# Patient Record
Sex: Male | Born: 1960
Health system: Southern US, Community
[De-identification: ages and names within clinical notes are randomized; demographics above are authoritative.]

## PROBLEM LIST (undated history)

## (undated) DIAGNOSIS — J449 Chronic obstructive pulmonary disease, unspecified: Secondary | ICD-10-CM

## (undated) DIAGNOSIS — I509 Heart failure, unspecified: Secondary | ICD-10-CM

## (undated) DIAGNOSIS — F32A Depression, unspecified: Secondary | ICD-10-CM

## (undated) DIAGNOSIS — E7800 Pure hypercholesterolemia, unspecified: Secondary | ICD-10-CM

## (undated) DIAGNOSIS — F419 Anxiety disorder, unspecified: Secondary | ICD-10-CM

## (undated) DIAGNOSIS — K219 Gastro-esophageal reflux disease without esophagitis: Secondary | ICD-10-CM

## (undated) DIAGNOSIS — I10 Essential (primary) hypertension: Secondary | ICD-10-CM

## (undated) DIAGNOSIS — J189 Pneumonia, unspecified organism: Secondary | ICD-10-CM

## (undated) DIAGNOSIS — F4310 Post-traumatic stress disorder, unspecified: Secondary | ICD-10-CM

## (undated) DIAGNOSIS — E119 Type 2 diabetes mellitus without complications: Secondary | ICD-10-CM

## (undated) HISTORY — PX: WISDOM TOOTH EXTRACTION: SHX21

## (undated) HISTORY — DX: Post-traumatic stress disorder, unspecified: F43.10

## (undated) HISTORY — DX: Heart failure, unspecified: I50.9

## (undated) HISTORY — PX: TONSILLECTOMY: SUR1361

## (undated) MED FILL — Medication: Fill #1 | Status: CN

---

## 2012-05-26 ENCOUNTER — Emergency Department: Payer: Self-pay | Admitting: Emergency Medicine

## 2017-06-06 ENCOUNTER — Ambulatory Visit: Admit: 2017-06-06 | Discharge: 2017-06-06 | Payer: PRIVATE HEALTH INSURANCE | Attending: Family Medicine

## 2017-06-06 ENCOUNTER — Inpatient Hospital Stay: Admit: 2017-06-06

## 2017-06-06 DIAGNOSIS — E119 Type 2 diabetes mellitus without complications: Secondary | ICD-10-CM

## 2017-06-06 MED ORDER — OMEPRAZOLE 40 MG CAP, DELAYED RELEASE
40 mg | ORAL_CAPSULE | Freq: Every day | ORAL | 2 refills | Status: AC
Start: 2017-06-06 — End: ?

## 2017-06-06 MED ORDER — ATORVASTATIN 40 MG TAB
40 mg | ORAL_TABLET | Freq: Every day | ORAL | 2 refills | Status: AC
Start: 2017-06-06 — End: ?

## 2017-06-06 MED ORDER — GABAPENTIN 300 MG CAP
300 mg | ORAL_CAPSULE | Freq: Three times a day (TID) | ORAL | 2 refills | Status: AC
Start: 2017-06-06 — End: ?

## 2017-06-06 MED ORDER — METFORMIN 1,000 MG TAB
1000 mg | ORAL_TABLET | Freq: Two times a day (BID) | ORAL | 2 refills | Status: AC
Start: 2017-06-06 — End: ?

## 2017-06-06 MED ORDER — CIPROFLOXACIN 500 MG TAB
500 mg | ORAL_TABLET | Freq: Two times a day (BID) | ORAL | 0 refills | Status: AC
Start: 2017-06-06 — End: 2017-06-13

## 2017-06-06 NOTE — Progress Notes (Signed)
Discharge instructions reviewed with patient  ??  Medication list and understanding of medications reviewed with patient.   OTC and herbal medications reviewed and added to med list if applicable  Barriers to adherence assessed.        Informed patient to give the office a call to follow up in a month. Patient expressed understanding.

## 2017-06-06 NOTE — Progress Notes (Signed)
Chief Complaint   Patient presents with   ??? Diabetes     "pt stated that he have not been on any medication for about 2 years pt stated he's not sure on medications he was on"   ??? Foot Ulcer     1. When and where did you last receive medical care? Yes Where: [place]     2.When and where did you last have preventive care such as mammogram, pap smears or colon screening? yes    3. What is your current living situation (for example, live alone, live in home with immediate family members)?       4. Do you have any problems with communication such trouble seeing, hearing, or understanding instructions?No    5. Do you have an advance directive?  This is a document that you can give to family members with instructions for how you would want them to make health care decisions for you if you were unable to speak for yourself.  (For example, unconscious, delerious)No    PMH/FH/Social Hx reviewed and updated as needed     Applicable screenings reviewed and updated as needed  Medication reconciliation performed. Patient does need medication refills.  Health Maintenance reviewed.

## 2017-06-06 NOTE — Progress Notes (Signed)
HPI  Bill Sandoval is a 56 y.o. male being seen today for   Chief Complaint   Patient presents with   ??? Diabetes     "pt stated that he have not been on any medication for about 2 years pt stated he's not sure on medications he was on"   ??? Foot Ulcer   IOV for this pt to care a Zenaida Niecevan.  he states that he is not on any diabetes medicine for a few years.   Has neuropathy and stepped on some glass in September.  Right foot swelled up but then got much better.  Still somewhat swollen and his big toenail is dark   Wonders if he needs an antibiotic    Smokes 1ppd.  Not ready to quit.     Previously on janumet, lisinopril. Amlodipine, flexeril, gabapentin, cymbalta    Past Medical History:   Diagnosis Date   ??? Back disorder    ??? Cardiovascular disease    ??? Diabetes (HCC)    ??? Hypercholesterolemia    ??? Hypertension    ??? Liver disease    ??? Spinal pain          ROS  Patient states that he is feeling well. Denies complaints of chest pain, shortness of breath, swelling of legs, dizziness or weakness. he denies nausea, vomiting or diarrhea.        Current Outpatient Medications   Medication Sig   ??? metFORMIN (GLUCOPHAGE) 1,000 mg tablet Take 1 Tab by mouth two (2) times daily (with meals).   ??? omeprazole (PRILOSEC) 40 mg capsule Take 1 Cap by mouth daily.   ??? ciprofloxacin HCl (CIPRO) 500 mg tablet Take 1 Tab by mouth two (2) times a day for 7 days.   ??? gabapentin (NEURONTIN) 300 mg capsule Take 1 Cap by mouth three (3) times daily.   ??? atorvastatin (LIPITOR) 40 mg tablet Take 1 Tab by mouth daily.   ??? SITagliptin-metFORMIN (JANUMET) 50-1,000 mg per tablet Take 1 Tab by mouth two (2) times daily (with meals).   ??? amLODIPine (NORVASC) 5 mg tablet Take 5 mg by mouth daily.   ??? cyclobenzaprine (FLEXERIL) 10 mg tablet Take  by mouth three (3) times daily as needed for Muscle Spasm(s).     No current facility-administered medications for this visit.        PE  Visit Vitals   BP 123/82 (BP 1 Location: Left arm, BP Patient Position: Sitting)   Pulse 86   Temp 97.5 ??F (36.4 ??C) (Temporal)   Resp 12   Ht 5\' 10"  (1.778 m)   Wt 187 lb (84.8 kg)   SpO2 98%   BMI 26.83 kg/m??      Alert and oriented with normal mood and affect. he is well developed and well nourished . Lungs are clear without wheezing. Heart rate is regular without murmurs or gallops. There is no lower extremity edema.  Normal ROM ankles and feet.  Some decreased sensation but normal DP pulse and no swelling.  Some tenderness    No results found for this or any previous visit.      Assessment and Plan:        ICD-10-CM ICD-9-CM    1. Controlled type 2 diabetes mellitus without complication, without long-term current use of insulin (HCC) E11.9 250.00    2. Foot infection L08.9 686.9    3. Smoker F17.200 305.1    4. Neuropathy G62.9 355.9        Refill  metformin, gabapentin, lipitor, prilosec  Labs today    cipro rx if foot worsens but expect will continue to improve  Follow up one month    Donnie MesaEmily W Lakyn Mantione, MD

## 2017-06-07 LAB — CBC WITH AUTOMATED DIFF
ABS. BASOPHILS: 0 10*3/uL (ref 0.0–0.1)
ABS. EOSINOPHILS: 0.2 10*3/uL (ref 0.0–0.4)
ABS. LYMPHOCYTES: 2.1 10*3/uL (ref 0.9–3.6)
ABS. MONOCYTES: 0.7 10*3/uL (ref 0.05–1.2)
ABS. NEUTROPHILS: 7.6 10*3/uL (ref 1.8–8.0)
BASOPHILS: 0 % (ref 0–2)
EOSINOPHILS: 2 % (ref 0–5)
HCT: 44.9 % (ref 36.0–48.0)
HGB: 15.1 g/dL (ref 13.0–16.0)
LYMPHOCYTES: 20 % — ABNORMAL LOW (ref 21–52)
MCH: 32.8 PG (ref 24.0–34.0)
MCHC: 33.6 g/dL (ref 31.0–37.0)
MCV: 97.4 FL — ABNORMAL HIGH (ref 74.0–97.0)
MONOCYTES: 7 % (ref 3–10)
MPV: 11.6 FL (ref 9.2–11.8)
NEUTROPHILS: 71 % (ref 40–73)
PLATELET: 212 10*3/uL (ref 135–420)
RBC: 4.61 M/uL — ABNORMAL LOW (ref 4.70–5.50)
RDW: 12.7 % (ref 11.6–14.5)
WBC: 10.6 10*3/uL (ref 4.6–13.2)

## 2017-06-07 LAB — METABOLIC PANEL, COMPREHENSIVE
A-G Ratio: 1.2 (ref 0.8–1.7)
ALT (SGPT): 31 U/L (ref 16–61)
AST (SGOT): 11 U/L — ABNORMAL LOW (ref 15–37)
Albumin: 4.1 g/dL (ref 3.4–5.0)
Alk. phosphatase: 94 U/L (ref 45–117)
Anion gap: 9 mmol/L (ref 3.0–18)
BUN/Creatinine ratio: 19 (ref 12–20)
BUN: 18 MG/DL (ref 7.0–18)
Bilirubin, total: 0.4 MG/DL (ref 0.2–1.0)
CO2: 25 mmol/L (ref 21–32)
Calcium: 9 MG/DL (ref 8.5–10.1)
Chloride: 105 mmol/L (ref 100–108)
Creatinine: 0.93 MG/DL (ref 0.6–1.3)
GFR est AA: >60 mL/min/{1.73_m2} (ref >60)
GFR est non-AA: >60 mL/min/{1.73_m2} (ref >60)
Globulin: 3.4 g/dL (ref 2.0–4.0)
Glucose: 232 mg/dL — ABNORMAL HIGH (ref 74–99)
Potassium: 4 mmol/L (ref 3.5–5.5)
Protein, total: 7.5 g/dL (ref 6.4–8.2)
Sodium: 139 mmol/L (ref 136–145)

## 2017-06-07 LAB — LIPID PANEL
CHOL/HDL Ratio: 6 — ABNORMAL HIGH (ref 0–5.0)
Cholesterol, total: 241 MG/DL — ABNORMAL HIGH (ref <200)
HDL Cholesterol: 40 MG/DL (ref 40–60)
LDL, calculated: 132.2 MG/DL — ABNORMAL HIGH (ref 0–100)
Triglyceride: 344 MG/DL — ABNORMAL HIGH (ref <150)
VLDL, calculated: 68.8 MG/DL

## 2017-06-07 LAB — MICROALBUMIN, UR, RAND W/ MICROALB/CREAT RATIO
Creatinine, urine random: 278 mg/dL — ABNORMAL HIGH (ref 30–125)
Microalbumin,urine random: 70.7 MG/DL — ABNORMAL HIGH (ref 0–3.0)
Microalbumin/Creat ratio (mg/g creat): 254 mg/g — ABNORMAL HIGH (ref 0–30)

## 2017-06-07 LAB — HEMOGLOBIN A1C WITH EAG
Est. average glucose: 249 mg/dL
Hemoglobin A1c: 10.3 % — ABNORMAL HIGH (ref 4.2–5.6)

## 2017-06-07 LAB — TSH 3RD GENERATION: TSH: 1.6 u[IU]/mL (ref 0.36–3.74)

## 2017-07-04 ENCOUNTER — Encounter: Attending: Family Medicine

## 2019-05-08 ENCOUNTER — Ambulatory Visit
Admission: RE | Admit: 2019-05-08 | Discharge: 2019-05-08 | Disposition: A | Discharge status: Home / Self Care | Payer: Disability Insurance | Source: Ambulatory Visit | Attending: Dentistry | Admitting: Dentistry

## 2019-05-08 ENCOUNTER — Other Ambulatory Visit: Payer: Self-pay | Admitting: Dentistry

## 2019-05-08 ENCOUNTER — Ambulatory Visit
Admission: RE | Admit: 2019-05-08 | Discharge: 2019-05-08 | Disposition: A | Discharge status: Home / Self Care | Payer: Disability Insurance | Attending: Dentistry | Admitting: Dentistry

## 2019-05-08 DIAGNOSIS — M545 Low back pain, unspecified: Secondary | ICD-10-CM

## 2019-06-17 ENCOUNTER — Encounter: Payer: Self-pay | Admitting: Emergency Medicine

## 2019-06-17 ENCOUNTER — Other Ambulatory Visit: Payer: Self-pay

## 2019-06-17 ENCOUNTER — Emergency Department: Payer: Disability Insurance

## 2019-06-17 ENCOUNTER — Emergency Department
Admission: EM | Admit: 2019-06-17 | Discharge: 2019-06-17 | Disposition: A | Discharge status: Home / Self Care | Payer: Disability Insurance | Attending: Emergency Medicine | Admitting: Emergency Medicine

## 2019-06-17 DIAGNOSIS — E119 Type 2 diabetes mellitus without complications: Secondary | ICD-10-CM | POA: Insufficient documentation

## 2019-06-17 DIAGNOSIS — I10 Essential (primary) hypertension: Secondary | ICD-10-CM | POA: Insufficient documentation

## 2019-06-17 DIAGNOSIS — J189 Pneumonia, unspecified organism: Secondary | ICD-10-CM | POA: Insufficient documentation

## 2019-06-17 DIAGNOSIS — F17200 Nicotine dependence, unspecified, uncomplicated: Secondary | ICD-10-CM | POA: Insufficient documentation

## 2019-06-17 HISTORY — DX: Type 2 diabetes mellitus without complications: E11.9

## 2019-06-17 HISTORY — DX: Essential (primary) hypertension: I10

## 2019-06-17 HISTORY — DX: Pure hypercholesterolemia, unspecified: E78.00

## 2019-06-17 LAB — CBC
HCT: 38.8 % — ABNORMAL LOW (ref 39.0–52.0)
Hemoglobin: 14 g/dL (ref 13.0–17.0)
MCH: 32 pg (ref 26.0–34.0)
MCHC: 36.1 g/dL — ABNORMAL HIGH (ref 30.0–36.0)
MCV: 88.8 fL (ref 80.0–100.0)
Platelets: 306 10*3/uL (ref 150–400)
RBC: 4.37 MIL/uL (ref 4.22–5.81)
RDW: 12.4 % (ref 11.5–15.5)
WBC: 13.7 10*3/uL — ABNORMAL HIGH (ref 4.0–10.5)
nRBC: 0 % (ref 0.0–0.2)

## 2019-06-17 LAB — BASIC METABOLIC PANEL
Anion gap: 12 (ref 5–15)
BUN: 14 mg/dL (ref 6–20)
CO2: 25 mmol/L (ref 22–32)
Calcium: 9.2 mg/dL (ref 8.9–10.3)
Chloride: 97 mmol/L — ABNORMAL LOW (ref 98–111)
Creatinine, Ser: 0.89 mg/dL (ref 0.61–1.24)
GFR calc Af Amer: >60 mL/min (ref >60)
GFR calc non Af Amer: >60 mL/min (ref >60)
Glucose, Bld: 322 mg/dL — ABNORMAL HIGH (ref 70–99)
Potassium: 3.9 mmol/L (ref 3.5–5.1)
Sodium: 134 mmol/L — ABNORMAL LOW (ref 135–145)

## 2019-06-17 LAB — TROPONIN I (HIGH SENSITIVITY): Troponin I (High Sensitivity): 7 ng/L (ref <18)

## 2019-06-17 MED ORDER — SODIUM CHLORIDE 0.9% FLUSH: 3.0000 mL | Freq: Once | INTRAVENOUS | Status: DC

## 2019-06-17 MED ORDER — LEVOFLOXACIN 750 MG PO TABS: 750.0000 mg | ORAL_TABLET | Freq: Every day | ORAL | 0 refills | Status: AC

## 2019-06-17 NOTE — ED Provider Notes (Signed)
[facility] Emergency Department Provider Note   ____________________________________________    I have reviewed the triage vital signs and the nursing notes.   HISTORY  Chief Complaint Shortness of Breath and Dizziness     HPI Timothy [name]. is a 58 y.o. male with a history of diabetes, hypercholesterolemia, hypertension, with a history of smoking who presents now with complaints of mild shortness of breath, right mid chest discomfort for several days now.  Does report a cough that is productive with yellow mucus.  No fevers but has had chills.  No sick contacts reported.  No significant myalgias.  No loss of taste or smell.  Past Medical History:  Diagnosis Date  . Diabetes mellitus without complication ([place])   . Hypercholesteremia   . Hypertension     There are no problems to display for this patient.   Past Surgical History:  Procedure Laterality Date  . TONSILLECTOMY      Prior to Admission medications   Medication Sig Start Date End Date Taking? Authorizing Provider  levofloxacin (LEVAQUIN) 750 MG tablet Take 1 tablet (750 mg total) by mouth daily for 7 days. 06/17/19 06/24/19  Lavonia Drafts, MD     Allergies Bee venom, Coffee flavor, Cheese, and Penicillins  No family history on file.  Social History Social History   Tobacco Use  . Smoking status: Current Every Day Smoker  . Smokeless tobacco: Never Used  Substance Use Topics  . Alcohol use: Not Currently  . Drug use: Not on file    Review of Systems  Constitutional: As above Eyes: No visual changes.  ENT: No sore throat. Cardiovascular: As above Respiratory: As above Gastrointestinal: No abdominal pain.  Genitourinary: No complaints of urinary Musculoskeletal: Negative for back pain. Skin: Negative for rash. Neurological: Negative for headaches    ____________________________________________   PHYSICAL EXAM:  VITAL SIGNS: ED Triage Vitals  Enc  Vitals Group     BP 06/17/19 1236 111/79     Pulse --      Resp 06/17/19 1236 17     Temp 06/17/19 1236 98.1 F (36.7 C)     Temp src --      SpO2 06/17/19 1236 98 %     Weight 06/17/19 1238 83.9 kg (185 lb)     Height 06/17/19 1238 1.778 m (5\' 10" )     Head Circumference --      Peak Flow --      Pain Score 06/17/19 1237 4     Pain Loc --      Pain Edu? --      Excl. in [place]? --     Constitutional: Alert and oriented.   Nose: No congestion/rhinnorhea. Mouth/Throat: Mucous membranes are moist.    Cardiovascular: Normal rate, regular rhythm.  Good peripheral circulation. Respiratory: Normal respiratory effort.  No retractions.  No wheezing   Musculoskeletal:  Warm and well perfused Neurologic:  Normal speech and language. No gross focal neurologic deficits are appreciated.  Skin:  Skin is warm, dry and intact. No rash noted. Psychiatric: Mood and affect are normal. Speech and behavior are normal.  ____________________________________________   LABS (all labs ordered are listed, but only abnormal results are displayed)  Labs Reviewed  BASIC METABOLIC PANEL - Abnormal; Notable for the following components:      Result Value   Sodium 134 (*)    Chloride 97 (*)    Glucose, Bld 322 (*)    All other components within  normal limits  CBC - Abnormal; Notable for the following components:   WBC 13.7 (*)    HCT 38.8 (*)    MCHC 36.1 (*)    All other components within normal limits  TROPONIN I (HIGH SENSITIVITY)  TROPONIN I (HIGH SENSITIVITY)   ____________________________________________  EKG  ED ECG REPORT I, Jene Every, the attending physician, personally viewed and interpreted this ECG.  Date: 06/17/2019  Rhythm: normal sinus rhythm QRS Axis: normal Intervals: normal ST/T Wave abnormalities: Nonspecific changes Narrative Interpretation: no evidence of acute ischemia  ____________________________________________  RADIOLOGY  Chest x-ray suspicious for  right upper lobe pneumonia ____________________________________________   PROCEDURES  Procedure(s) performed: No  Procedures   Critical Care performed: No ____________________________________________   INITIAL IMPRESSION / ASSESSMENT AND PLAN / ED COURSE  Pertinent labs & imaging results that were available during my care of the patient were reviewed by me and considered in my medical decision making (see chart for details).  Patient well-appearing and in no acute distress.  History of diabetes, smoking, chest x-ray and HPI consistent with pneumonia.  Will start patient on Levaquin, discussed with him the need for repeat x-ray to demonstrate clearing, return precautions if no improvement    ____________________________________________   FINAL CLINICAL IMPRESSION(S) / ED DIAGNOSES  Final diagnoses:  Community acquired pneumonia of right upper lobe of lung        Note:  This document was prepared using Dragon voice recognition software and may include unintentional dictation errors.   Jene Every, MD 06/17/19 [phone]

## 2019-06-17 NOTE — ED Triage Notes (Addendum)
Right side lung pain for a bout a week.  deneis fever.  No injry.  Says chills though.  He braces under right arm when he coughs.  Also says he feels light headed at times.  This has been going on 2 years, but worse for couple weeks.

## 2019-07-14 DIAGNOSIS — F4310 Post-traumatic stress disorder, unspecified: Secondary | ICD-10-CM | POA: Insufficient documentation

## 2019-07-14 DIAGNOSIS — F602 Antisocial personality disorder: Secondary | ICD-10-CM | POA: Insufficient documentation

## 2019-08-16 ENCOUNTER — Encounter: Payer: Self-pay | Admitting: Emergency Medicine

## 2019-08-16 ENCOUNTER — Emergency Department
Admission: EM | Admit: 2019-08-16 | Discharge: 2019-08-16 | Disposition: A | Discharge status: Home / Self Care | Payer: Disability Insurance | Attending: Emergency Medicine | Admitting: Emergency Medicine

## 2019-08-16 ENCOUNTER — Other Ambulatory Visit: Payer: Self-pay

## 2019-08-16 DIAGNOSIS — Z9114 Patient's other noncompliance with medication regimen: Secondary | ICD-10-CM | POA: Insufficient documentation

## 2019-08-16 DIAGNOSIS — I10 Essential (primary) hypertension: Secondary | ICD-10-CM | POA: Insufficient documentation

## 2019-08-16 DIAGNOSIS — Z9119 Patient's noncompliance with other medical treatment and regimen: Secondary | ICD-10-CM

## 2019-08-16 DIAGNOSIS — Z7984 Long term (current) use of oral hypoglycemic drugs: Secondary | ICD-10-CM | POA: Insufficient documentation

## 2019-08-16 DIAGNOSIS — Z91199 Patient's noncompliance with other medical treatment and regimen due to unspecified reason: Secondary | ICD-10-CM

## 2019-08-16 DIAGNOSIS — L089 Local infection of the skin and subcutaneous tissue, unspecified: Secondary | ICD-10-CM

## 2019-08-16 DIAGNOSIS — E1165 Type 2 diabetes mellitus with hyperglycemia: Secondary | ICD-10-CM | POA: Insufficient documentation

## 2019-08-16 DIAGNOSIS — F17200 Nicotine dependence, unspecified, uncomplicated: Secondary | ICD-10-CM | POA: Insufficient documentation

## 2019-08-16 LAB — CBC WITH DIFFERENTIAL/PLATELET
Abs Immature Granulocytes: 0.03 10*3/uL (ref 0.00–0.07)
Basophils Absolute: 0.1 10*3/uL (ref 0.0–0.1)
Basophils Relative: 1 %
Eosinophils Absolute: 0.2 10*3/uL (ref 0.0–0.5)
Eosinophils Relative: 2 %
HCT: 42.2 % (ref 39.0–52.0)
Hemoglobin: 14.5 g/dL (ref 13.0–17.0)
Immature Granulocytes: 0 %
Lymphocytes Relative: 26 %
Lymphs Abs: 1.8 10*3/uL (ref 0.7–4.0)
MCH: 31.9 pg (ref 26.0–34.0)
MCHC: 34.4 g/dL (ref 30.0–36.0)
MCV: 93 fL (ref 80.0–100.0)
Monocytes Absolute: 0.5 10*3/uL (ref 0.1–1.0)
Monocytes Relative: 7 %
Neutro Abs: 4.4 10*3/uL (ref 1.7–7.7)
Neutrophils Relative %: 64 %
Platelets: 262 10*3/uL (ref 150–400)
RBC: 4.54 MIL/uL (ref 4.22–5.81)
RDW: 12.6 % (ref 11.5–15.5)
WBC: 7 10*3/uL (ref 4.0–10.5)
nRBC: 0 % (ref 0.0–0.2)

## 2019-08-16 LAB — COMPREHENSIVE METABOLIC PANEL
ALT: 13 U/L (ref 0–44)
AST: 16 U/L (ref 15–41)
Albumin: 4.2 g/dL (ref 3.5–5.0)
Alkaline Phosphatase: 66 U/L (ref 38–126)
Anion gap: 8 (ref 5–15)
BUN: 16 mg/dL (ref 6–20)
CO2: 25 mmol/L (ref 22–32)
Calcium: 8.8 mg/dL — ABNORMAL LOW (ref 8.9–10.3)
Chloride: 102 mmol/L (ref 98–111)
Creatinine, Ser: 0.8 mg/dL (ref 0.61–1.24)
GFR calc Af Amer: >60 mL/min (ref >60)
GFR calc non Af Amer: >60 mL/min (ref >60)
Glucose, Bld: 264 mg/dL — ABNORMAL HIGH (ref 70–99)
Potassium: 4.2 mmol/L (ref 3.5–5.1)
Sodium: 135 mmol/L (ref 135–145)
Total Bilirubin: 1 mg/dL (ref 0.3–1.2)
Total Protein: 7.2 g/dL (ref 6.5–8.1)

## 2019-08-16 LAB — GLUCOSE, CAPILLARY: Glucose-Capillary: 285 mg/dL — ABNORMAL HIGH (ref 70–99)

## 2019-08-16 MED ORDER — CEPHALEXIN 500 MG PO CAPS
500.0000 mg | ORAL_CAPSULE | Freq: Once | ORAL | Status: AC
  Administered 2019-08-16: 500 mg via ORAL
  Filled 2019-08-16: qty 1

## 2019-08-16 MED ORDER — METFORMIN HCL 500 MG PO TABS: 500.0000 mg | ORAL_TABLET | Freq: Every day | ORAL | 4 refills | Status: DC

## 2019-08-16 MED ORDER — METFORMIN HCL 500 MG PO TABS
500.0000 mg | ORAL_TABLET | Freq: Once | ORAL | Status: AC
  Administered 2019-08-16: 500 mg via ORAL
  Filled 2019-08-16: qty 1

## 2019-08-16 MED ORDER — SULFAMETHOXAZOLE-TRIMETHOPRIM 800-160 MG PO TABS
1.0000 | ORAL_TABLET | Freq: Once | ORAL | Status: AC
  Administered 2019-08-16: 1 via ORAL
  Filled 2019-08-16: qty 1

## 2019-08-16 MED ORDER — SULFAMETHOXAZOLE-TRIMETHOPRIM 800-160 MG PO TABS: 1.0000 | ORAL_TABLET | Freq: Two times a day (BID) | ORAL | 0 refills | Status: DC

## 2019-08-16 MED ORDER — CEPHALEXIN 500 MG PO CAPS: 500.0000 mg | ORAL_CAPSULE | Freq: Three times a day (TID) | ORAL | 0 refills | Status: DC

## 2019-08-16 NOTE — ED Provider Notes (Signed)
[facility] Emergency Department Provider Note  ____________________________________________   First MD Initiated Contact with Patient 08/16/19 1319     (approximate)  I have reviewed the triage vital signs and the nursing notes.   HISTORY  Chief Complaint Wound Check   HPI Timothy Pella. is a 59 y.o. male presents to the ED with complaint of a "hole" on the bottom of his right foot.  Patient states that 3 days ago he took a shower while he was drying off noticed a callus that he scratched at.  He states since that time he has noticed some "leaking" from the area.  Patient states he has diabetes and currently is not on any medication as he is unable to afford it.  Patient states is been 5 years since he has taken any diabetic medicine.  He denies any fever or chills.  He does have peripheral neuropathy in his feet.      Past Medical History:  Diagnosis Date  . Diabetes mellitus without complication ([place])   . Hypercholesteremia   . Hypertension     There are no problems to display for this patient.   Past Surgical History:  Procedure Laterality Date  . TONSILLECTOMY      Prior to Admission medications   Medication Sig Start Date End Date Taking? Authorizing Provider  cephALEXin (KEFLEX) 500 MG capsule Take 1 capsule (500 mg total) by mouth 3 (three) times daily. 08/16/19   Johnn Hai, PA-C  metFORMIN (GLUCOPHAGE) 500 MG tablet Take 1 tablet (500 mg total) by mouth daily with breakfast. 08/16/19 08/15/20  Letitia Neri L, PA-C  sulfamethoxazole-trimethoprim (BACTRIM DS) 800-160 MG tablet Take 1 tablet by mouth 2 (two) times daily. 08/16/19   Johnn Hai, PA-C    Allergies Bee venom, Coffee flavor, Cheese, and Penicillins  History reviewed. No pertinent family history.  Social History Social History   Tobacco Use  . Smoking status: Current Every Day Smoker  . Smokeless tobacco: Never Used  Substance Use Topics  . Alcohol  use: Not Currently  . Drug use: Not on file    Review of Systems Constitutional: No fever/chills Cardiovascular: Denies chest pain. Respiratory: Denies shortness of breath. Musculoskeletal: Negative for back pain. Skin: Positive for open wound right foot. Neurological: Negative for headaches, focal weakness or numbness. ____________________________________________   PHYSICAL EXAM:  VITAL SIGNS: ED Triage Vitals  Enc Vitals Group     BP 08/16/19 1302 (!) 158/90     Pulse Rate 08/16/19 1302 85     Resp 08/16/19 1302 18     Temp 08/16/19 1302 99 F (37.2 C)     Temp Source 08/16/19 1302 Oral     SpO2 08/16/19 1302 99 %     Weight 08/16/19 1306 177 lb (80.3 kg)     Height 08/16/19 1306 5\' 10"  (1.778 m)     Head Circumference --      Peak Flow --      Pain Score 08/16/19 1305 7     Pain Loc --      Pain Edu? --      Excl. in [place]? --     Constitutional: Alert and oriented. Well appearing and in no acute distress. Eyes: Conjunctivae are normal.  Head: Atraumatic. Neck: No stridor.   Cardiovascular: Normal rate, regular rhythm. Grossly normal heart sounds.  Good peripheral circulation. Respiratory: Normal respiratory effort.  No retractions. Lungs CTAB. Musculoskeletal: No lower extremity tenderness nor edema.  No  joint effusions.  Nontender on palpation of the right foot.  There is a strong dorsalis pedis palpated.  No erythema or warmth is appreciated.  There is a single superficial open area on the plantar aspect at the base of the first and second digit without active drainage. Neurologic:  Normal speech and language. No gross focal neurologic deficits are appreciated. No gait instability. Skin:  Skin is warm, dry.  Psychiatric: Mood and affect are normal. Speech and behavior are normal.  ____________________________________________   LABS (all labs ordered are listed, but only abnormal results are displayed)  Labs Reviewed  GLUCOSE, CAPILLARY - Abnormal; Notable for  the following components:      Result Value   Glucose-Capillary 285 (*)    All other components within normal limits  COMPREHENSIVE METABOLIC PANEL - Abnormal; Notable for the following components:   Glucose, Bld 264 (*)    Calcium 8.8 (*)    All other components within normal limits  AEROBIC CULTURE (SUPERFICIAL SPECIMEN)  CBC WITH DIFFERENTIAL/PLATELET    PROCEDURES  Procedure(s) performed (including Critical Care):  Procedures   ____________________________________________   INITIAL IMPRESSION / ASSESSMENT AND PLAN / ED COURSE  As part of my medical decision making, I reviewed the following data within the electronic MEDICAL RECORD NUMBER Notes from prior ED visits and [place] Controlled Substance Database  59 year old male presents to the ED with complaint of an opening on the anterior aspect of his right foot.  Patient states that he took a shower and was scratching a callused area on his right foot which caused the initial skin injury.  Patient denies any fever or chills.  He has not been taking any diabetic medicine as he states he cannot afford it.  I he has been 5 years since he last took anything for diabetes.  Lab work was reassuring and patient was made aware.  His allergy to penicillin was when he was a child and we discussed Keflex.  He is aware that if he begins with a rash he is to discontinue the cephalexin.  He was also given a prescription for Bactrim DS twice daily for 10 days and Metformin 500 mg twice daily.  His medicines were sent to medication management for him to pick up on Monday.  The first dose was given in the ED today.  He was given a list of clinics to follow-up with including [facility] for [facility] of his diabetes and also to watch for any continued problems with his foot.  ____________________________________________   FINAL CLINICAL IMPRESSION(S) / ED DIAGNOSES  Final diagnoses:  Right foot infection  Poorly controlled diabetes  mellitus (HCC)  Medically noncompliant     ED Discharge Orders         Ordered    sulfamethoxazole-trimethoprim (BACTRIM DS) 800-160 MG tablet  2 times daily     08/16/19 1417    cephALEXin (KEFLEX) 500 MG capsule  3 times daily     08/16/19 1417    metFORMIN (GLUCOPHAGE) 500 MG tablet  Daily with breakfast     08/16/19 1417           Note:  This document was prepared using Dragon voice recognition software and may include unintentional dictation errors.    Tommi Rumps, PA-C 08/16/19 1448    Minna Antis, MD 08/16/19 [phone]

## 2019-08-16 NOTE — ED Triage Notes (Signed)
Pt arrived via POV with wound to plantar area of right foot, states noticed about 3 days ago, pt is diabetic not currently on medications due to inability to afford meds.  Pt states he does have neuropathy to feet.   Wound on assessment is dry at this time, but pt states drainage is present  At times. 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   [facility] to [facility].

## 2019-08-16 NOTE — Discharge Instructions (Addendum)
Tomorrow go to medication management which is across from the hospital on [facility].  Your medications were sent there to be filled.  Also you need to make arrangements to be [facility] which is [facility].  You may also follow-up with [facility], [facility], or [facility] and their numbers are listed on your discharge papers.  Keep this area clean and dry.  Clean daily with mild soap and water.  Take medications as prescribed.  Also watch your diabetic diet which will help control your diabetes.  Return to the emergency department if any urgent changes or worsening of your symptoms.  If you develop any rash discontinue taking the Keflex which is the one that we discussed.  Although it is not penicillin you could have a 10% chance of a reaction.  Take Metformin twice a day and watch the foods that you are eating.

## 2019-08-16 NOTE — ED Notes (Signed)
Pt states "hole" on the bottom of his right foot. Pt states started as what appeared to be a callous and has gotten worse over the last 3 days with blood and "yellow pus" leaking from wound. Pt has hx of diabetes.

## 2019-08-18 LAB — AEROBIC CULTURE W GRAM STAIN (SUPERFICIAL SPECIMEN)
Gram Stain: NONE SEEN
Special Requests: NORMAL

## 2019-10-02 ENCOUNTER — Ambulatory Visit: Payer: Disability Insurance | Admitting: Pharmacy Technician

## 2019-10-02 ENCOUNTER — Other Ambulatory Visit: Payer: Self-pay

## 2019-10-02 DIAGNOSIS — Z79899 Other long term (current) drug therapy: Secondary | ICD-10-CM

## 2019-10-02 NOTE — Progress Notes (Signed)
Completed patient with financial assistance application for [facility] due to recent hospital visit.  Will forward to appropriate department in [facility].    Completed [facility] application and contract.  Patient agreed to all terms of the [facility] contract.    Patient approved to receive medication assistance at [facility], until till for re-certification in 5051, and as long as eligibility criteria continues to be met.    Provided patient with Civil engineer, contracting based on his particular needs.    [facility]

## 2019-10-15 ENCOUNTER — Other Ambulatory Visit: Payer: Self-pay

## 2019-10-15 ENCOUNTER — Ambulatory Visit: Payer: Self-pay | Admitting: Gerontology

## 2019-10-15 ENCOUNTER — Encounter: Payer: Self-pay | Admitting: Gerontology

## 2019-10-15 VITALS — BP 149/84 | HR 79 | Ht 70.0 in | Wt 188.1 lb

## 2019-10-15 DIAGNOSIS — G629 Polyneuropathy, unspecified: Secondary | ICD-10-CM | POA: Insufficient documentation

## 2019-10-15 DIAGNOSIS — Z8719 Personal history of other diseases of the digestive system: Secondary | ICD-10-CM

## 2019-10-15 DIAGNOSIS — R109 Unspecified abdominal pain: Secondary | ICD-10-CM

## 2019-10-15 DIAGNOSIS — Z7689 Persons encountering health services in other specified circumstances: Secondary | ICD-10-CM | POA: Insufficient documentation

## 2019-10-15 DIAGNOSIS — IMO0001 Reserved for inherently not codable concepts without codable children: Secondary | ICD-10-CM

## 2019-10-15 DIAGNOSIS — G6289 Other specified polyneuropathies: Secondary | ICD-10-CM

## 2019-10-15 DIAGNOSIS — E119 Type 2 diabetes mellitus without complications: Secondary | ICD-10-CM | POA: Insufficient documentation

## 2019-10-15 DIAGNOSIS — F17200 Nicotine dependence, unspecified, uncomplicated: Secondary | ICD-10-CM | POA: Insufficient documentation

## 2019-10-15 DIAGNOSIS — E118 Type 2 diabetes mellitus with unspecified complications: Secondary | ICD-10-CM | POA: Insufficient documentation

## 2019-10-15 DIAGNOSIS — Z8619 Personal history of other infectious and parasitic diseases: Secondary | ICD-10-CM

## 2019-10-15 DIAGNOSIS — E1140 Type 2 diabetes mellitus with diabetic neuropathy, unspecified: Secondary | ICD-10-CM | POA: Insufficient documentation

## 2019-10-15 DIAGNOSIS — I10 Essential (primary) hypertension: Secondary | ICD-10-CM | POA: Insufficient documentation

## 2019-10-15 LAB — POCT GLYCOSYLATED HEMOGLOBIN (HGB A1C): Hemoglobin A1C: 8.1 % — AB (ref 4.0–5.6)

## 2019-10-15 MED ORDER — BLOOD GLUCOSE MONITOR KIT: PACK | 0 refills | Status: AC

## 2019-10-15 MED ORDER — AMLODIPINE BESYLATE 5 MG PO TABS: 5.0000 mg | ORAL_TABLET | Freq: Every day | ORAL | 0 refills | Status: DC

## 2019-10-15 MED ORDER — ESOMEPRAZOLE MAGNESIUM 20 MG PO CPDR: 20.0000 mg | DELAYED_RELEASE_CAPSULE | Freq: Every day | ORAL | 0 refills | Status: DC

## 2019-10-15 MED ORDER — GABAPENTIN 100 MG PO CAPS: 100.0000 mg | ORAL_CAPSULE | Freq: Every day | ORAL | 0 refills | Status: DC

## 2019-10-15 NOTE — Progress Notes (Signed)
epaPatient ID: Timothy Main., male   DOB: [birth year], 59 y.o.   MRN: [id number]  Chief Complaint  Patient presents with  . Establish Care  . Open Wound    right  . Numbness    bilateral feet  . Tingling    bilateral feet    HPI Timothy Platner. is a 59 y.o. male who presents to establish care and evaluation of his chronic conditions. He has a history of type 2 diabetes that was diagnosed in 2014. His HgbA1c done during visit was 8.1% and his blood glucose reading was 148 mg/dl. He states that he doesn't check his blood glucose and was out of his medication because he can't afford it. He reports having peripheral neuropathy. He was treated at the ED on 08/16/2019 for infected wound to plantar surface of his right foot. He states that the wound has completely healed up. He states that he has a history of hypertension and was treated with Amlodipine. He continues to smoke 1 pack of cigarette daily and admits the desire to quit. He also states that he has a history of Gerd, and taking Nexium relieves his symptoms. He also states that he has a history of Hepatitis C and has not been treated. Also, he c/o generalized intermittent abdominal cramps that has been going on for 6 months. He denies any aggravating or relieving factor. He states that it could happen at anytime and resolves within 2-3 minutes. Overall, he states that he's doing well and offers no further complaint.   Past Medical History:  Diagnosis Date  . Diabetes mellitus without complication (Timothy [name])   . Hypercholesteremia   . Hypertension     Past Surgical History:  Procedure Laterality Date  . TONSILLECTOMY      No family history on file.  Social History Social History   Tobacco Use  . Smoking status: Current Every Day Smoker  . Smokeless tobacco: Never Used  Substance Use Topics  . Alcohol use: Not Currently  . Drug use: Not on file    Allergies  Allergen Reactions  . Bee Venom Anaphylaxis  . Coffee Flavor  Shortness Of Breath  . Cheese Swelling    Blue Cheese only  . Penicillins Other (See Comments)    unknown    Current Outpatient Medications  Medication Sig Dispense Refill  . Acetaminophen (ARTHRITIS PAIN PO) Take 1,000 mg by mouth daily.    . calcium carbonate (TUMS - DOSED IN MG ELEMENTAL CALCIUM) 500 MG chewable tablet Chew by mouth.    Marland Kitchen ibuprofen (ADVIL) 200 MG tablet Take 1,000 mg by mouth daily.    . metFORMIN (GLUCOPHAGE) 500 MG tablet Take 1 tablet (500 mg total) by mouth daily with breakfast. (Patient not taking: Reported on 10/15/2019) 30 tablet 4   No current facility-administered medications for this visit.    Review of Systems Review of Systems  Constitutional: Negative.   HENT: Negative.   Eyes: Negative.   Respiratory: Positive for shortness of breath (smoking for 40 yrs).   Cardiovascular: Negative.   Gastrointestinal: Negative.        Intermittent cramps to abdomen  Endocrine: Positive for polyuria.  Genitourinary: Negative.   Neurological: Negative.   Hematological: Negative.   Psychiatric/Behavioral: Negative.     Blood pressure (!) 149/84, pulse 79, height '5\' 10"'$  (1.778 m), weight 188 lb 1.6 oz (85.3 kg), SpO2 98 %.  Physical Exam Physical Exam Constitutional:      Appearance: Normal appearance.  HENT:  Head: Normocephalic and atraumatic.     Nose:     Comments: Deferred per Covid Protocol    Mouth/Throat:     Comments: Deferred per Covid protocol Eyes:     Extraocular Movements: Extraocular movements intact.     Pupils: Pupils are equal, round, and reactive to light.  Cardiovascular:     Rate and Rhythm: Normal rate and regular rhythm.     Pulses: Normal pulses.     Heart sounds: Normal heart sounds.  Pulmonary:     Effort: Pulmonary effort is normal.     Breath sounds: Normal breath sounds.  Abdominal:     General: Abdomen is flat. Bowel sounds are normal.     Palpations: Abdomen is soft.  Genitourinary:    Comments: Deferred per  patient Musculoskeletal:        General: Normal range of motion.     Cervical back: Normal range of motion.  Skin:    General: Skin is warm and dry.  Neurological:     General: No focal deficit present.     Mental Status: He is alert and oriented to person, place, and time. Mental status is at baseline.  Psychiatric:        Mood and Affect: Mood normal.        Behavior: Behavior normal.        Thought Content: Thought content normal.        Judgment: Judgment normal.     Data Reviewed Lab and past medical history was reviewed.   Assessment and Plan  1. Encounter to establish care - Routine labs will be checked - Ambulatory referral to Gastroenterology - CBC w/Diff; Future - Lipid panel; Future - Comp Met (CMET); Future - Urinalysis; Future - TSH; Future - PSA - Urine Microalbumin w/creat. ratio; Future  2. Essential hypertension - He will continue on 5 mg Amlodipine, was educated on medication side effect and advised to notify clinic. - amLODipine (NORVASC) 5 MG tablet; Take 1 tablet (5 mg total) by mouth daily.  Dispense: 30 tablet; Refill: 0  3. Smoking - He was advised on smoking cessation, provided with [place] Quit line information. - Ambulatory referral to Hematology / Oncology for low dose CT scan.  4. Other polyneuropathy - He will continue on gabapentin, was educated on medication side effects and advised to notify clinic. - gabapentin (NEURONTIN) 100 MG capsule; Take 1 capsule (100 mg total) by mouth at bedtime.  Dispense: 30 capsule; Refill: 0  5. Cramp, abdominal - He was advised to notify clinic for worsening symptoms and go to the ED.  6. History of hepatitis  - Hepatitis, Acute; Future, will refer to Gastroenterology if result is positive.  7. History of gastroesophageal reflux (GERD) - He will continue on Nexium and was advised to  -Avoid spicy, fatty and fried food -Avoid sodas and sour juices -Avoid heavy meals -Avoid eating 4 hours before  bedtime -Elevate head of bed at night - esomeprazole (NEXIUM) 20 MG capsule; Take 1 capsule (20 mg total) by mouth daily at 12 noon.  Dispense: 30 capsule; Refill: 0  8. Type 2 diabetes mellitus with diabetic neuropathy, without long-term current use of insulin (HCC) - His HgbA1c was 8.1% and his goal is less than 7%. -Use Diabetic diet as advised  -Check blood sugar 2-3 times a day, once before breakfast and others 2 hours after lunch or dinner -Write down the numbers against date in a log -Bring log to clinic every visit -Take medications  regularly as advised -Regular exercise - blood glucose meter kit and supplies KIT; Dispense based on patient and insurance preference. Use up to four times daily as directed. (FOR ICD-9 250.00, 250.01).  Dispense: 1 each; Refill: 0 - gabapentin (NEURONTIN) 100 MG capsule; Take 1 capsule (100 mg total) by mouth at bedtime.  Dispense: 30 capsule; Refill: 0 - POCT HgB A1C; Future - POCT HgB A1C    Follow up: 11/04/2019 or if symptom worsens or fails to improve.   Jacia Sickman E Sadiel Mota 10/15/2019, 3:04 PM

## 2019-10-15 NOTE — Patient Instructions (Signed)
Carbohydrate Counting for Diabetes Mellitus, Adult  Carbohydrate counting is a method of keeping track of how many carbohydrates you eat. Eating carbohydrates naturally increases the amount of sugar (glucose) in the blood. Counting how many carbohydrates you eat helps keep your blood glucose within normal limits, which helps you manage your diabetes (diabetes mellitus). It is important to know how many carbohydrates you can safely have in each meal. This is different for every person. A diet and nutrition specialist (registered dietitian) can help you make a meal plan and calculate how many carbohydrates you should have at each meal and snack. Carbohydrates are found in the following foods:  Grains, such as breads and cereals.  Dried beans and soy products.  Starchy vegetables, such as potatoes, peas, and corn.  Fruit and fruit juices.  Milk and yogurt.  Sweets and snack foods, such as cake, cookies, candy, chips, and soft drinks. How do I count carbohydrates? There are two ways to count carbohydrates in food. You can use either of the methods or a combination of both. Reading "Nutrition Facts" on packaged food The "Nutrition Facts" list is included on the labels of almost all packaged foods and beverages in the [place] It includes:  The serving size.  Information about nutrients in each serving, including the grams (g) of carbohydrate per serving. To use the "Nutrition Facts":  Decide how many servings you will have.  Multiply the number of servings by the number of carbohydrates per serving.  The resulting number is the total amount of carbohydrates that you will be having. Learning standard serving sizes of other foods When you eat carbohydrate foods that are not packaged or do not include "Nutrition Facts" on the label, you need to measure the servings in order to count the amount of carbohydrates:  Measure the foods that you will eat with a food scale or measuring cup, if needed.   Decide how many standard-size servings you will eat.  Multiply the number of servings by 15. Most carbohydrate-rich foods have about 15 g of carbohydrates per serving. ? For example, if you eat 8 oz (170 g) of strawberries, you will have eaten 2 servings and 30 g of carbohydrates (2 servings x 15 g = 30 g).  For foods that have more than one food mixed, such as soups and casseroles, you must count the carbohydrates in each food that is included. The following list contains standard serving sizes of common carbohydrate-rich foods. Each of these servings has about 15 g of carbohydrates:   hamburger bun or  English muffin.   oz (15 mL) syrup.   oz (14 g) jelly.  1 slice of bread.  1 six-inch tortilla.  3 oz (85 g) cooked rice or pasta.  4 oz (113 g) cooked dried beans.  4 oz (113 g) starchy vegetable, such as peas, corn, or potatoes.  4 oz (113 g) hot cereal.  4 oz (113 g) mashed potatoes or  of a large baked potato.  4 oz (113 g) canned or frozen fruit.  4 oz (120 mL) fruit juice.  4-6 crackers.  6 chicken nuggets.  6 oz (170 g) unsweetened dry cereal.  6 oz (170 g) plain fat-free yogurt or yogurt sweetened with artificial sweeteners.  8 oz (240 mL) milk.  8 oz (170 g) fresh fruit or one small piece of fruit.  24 oz (680 g) popped popcorn. Example of carbohydrate counting Sample meal  3 oz (85 g) chicken breast.  6 oz (170 g)   brown rice.  4 oz (113 g) corn.  8 oz (240 mL) milk.  8 oz (170 g) strawberries with sugar-free whipped topping. Carbohydrate calculation 1. Identify the foods that contain carbohydrates: ? Rice. ? Corn. ? Milk. ? Strawberries. 2. Calculate how many servings you have of each food: ? 2 servings rice. ? 1 serving corn. ? 1 serving milk. ? 1 serving strawberries. 3. Multiply each number of servings by 15 g: ? 2 servings rice x 15 g = 30 g. ? 1 serving corn x 15 g = 15 g. ? 1 serving milk x 15 g = 15 g. ? 1 serving  strawberries x 15 g = 15 g. 4. Add together all of the amounts to find the total grams of carbohydrates eaten: ? 30 g + 15 g + 15 g + 15 g = 75 g of carbohydrates total. Summary  Carbohydrate counting is a method of keeping track of how many carbohydrates you eat.  Eating carbohydrates naturally increases the amount of sugar (glucose) in the blood.  Counting how many carbohydrates you eat helps keep your blood glucose within normal limits, which helps you manage your diabetes.  A diet and nutrition specialist (registered dietitian) can help you make a meal plan and calculate how many carbohydrates you should have at each meal and snack. This information is not intended to replace advice given to you by your health care provider. Make sure you discuss any questions you have with your health care provider. Document Revised: 12/27/2016 Document Reviewed: 11/16/2015 Elsevier Patient Education  2020 [employer] DASH Eating Plan DASH stands for "Dietary Approaches to Stop Hypertension." The DASH eating plan is a healthy eating plan that has been shown to reduce high blood pressure (hypertension). It may also reduce your risk for type 2 diabetes, heart disease, and stroke. The DASH eating plan may also help with weight loss. What are tips for following this plan?  General guidelines  Avoid eating more than 2,300 mg (milligrams) of salt (sodium) a day. If you have hypertension, you may need to reduce your sodium intake to 1,500 mg a day.  Limit alcohol intake to no more than 1 drink a day for nonpregnant women and 2 drinks a day for men. One drink equals 12 oz of beer, 5 oz of wine, or 1 oz of hard liquor.  Work with your health care provider to maintain a healthy body weight or to lose weight. Ask what an ideal weight is for you.  Get at least 30 minutes of exercise that causes your heart to beat faster (aerobic exercise) most days of the week. Activities may include walking, swimming, or  biking.  Work with your health care provider or diet and nutrition specialist (dietitian) to adjust your eating plan to your individual calorie needs. Reading food labels   Check food labels for the amount of sodium per serving. Choose foods with less than 5 percent of the Daily Value of sodium. Generally, foods with less than 300 mg of sodium per serving fit into this eating plan.  To find whole grains, look for the word "whole" as the first word in the ingredient list. Shopping  Buy products labeled as "low-sodium" or "no salt added."  Buy fresh foods. Avoid canned foods and premade or frozen meals. Cooking  Avoid adding salt when cooking. Use salt-free seasonings or herbs instead of table salt or sea salt. Check with your health care provider or pharmacist before using salt substitutes.  Do not   fry foods. Cook foods using healthy methods such as baking, boiling, grilling, and broiling instead.  Cook with heart-healthy oils, such as olive, canola, soybean, or sunflower oil. Meal planning  Eat a balanced diet that includes: ? 5 or more servings of fruits and vegetables each day. At each meal, try to fill half of your plate with fruits and vegetables. ? Up to 6-8 servings of whole grains each day. ? Less than 6 oz of lean meat, poultry, or fish each day. A 3-oz serving of meat is about the same size as a deck of cards. One egg equals 1 oz. ? 2 servings of low-fat dairy each day. ? A serving of nuts, seeds, or beans 5 times each week. ? Heart-healthy fats. Healthy fats called Omega-3 fatty acids are found in foods such as flaxseeds and coldwater fish, like sardines, salmon, and mackerel.  Limit how much you eat of the following: ? Canned or prepackaged foods. ? Food that is high in trans fat, such as fried foods. ? Food that is high in saturated fat, such as fatty meat. ? Sweets, desserts, sugary drinks, and other foods with added sugar. ? Full-fat dairy products.  Do not salt  foods before eating.  Try to eat at least 2 vegetarian meals each week.  Eat more home-cooked food and less restaurant, buffet, and fast food.  When eating at a restaurant, ask that your food be prepared with less salt or no salt, if possible. What foods are recommended? The items listed may not be a complete list. Talk with your dietitian about what dietary choices are best for you. Grains Whole-grain or whole-wheat bread. Whole-grain or whole-wheat pasta. Brown rice. Oatmeal. Quinoa. Bulgur. Whole-grain and low-sodium cereals. Pita bread. Low-fat, low-sodium crackers. Whole-wheat flour tortillas. Vegetables Fresh or frozen vegetables (raw, steamed, roasted, or grilled). Low-sodium or reduced-sodium tomato and vegetable juice. Low-sodium or reduced-sodium tomato sauce and tomato paste. Low-sodium or reduced-sodium canned vegetables. Fruits All fresh, dried, or frozen fruit. Canned fruit in natural juice (without added sugar). Meat and other protein foods Skinless chicken or turkey. Ground chicken or turkey. Pork with fat trimmed off. Fish and seafood. Egg whites. Dried beans, peas, or lentils. Unsalted nuts, nut butters, and seeds. Unsalted canned beans. Lean cuts of beef with fat trimmed off. Low-sodium, lean deli meat. Dairy Low-fat (1%) or fat-free (skim) milk. Fat-free, low-fat, or reduced-fat cheeses. Nonfat, low-sodium ricotta or cottage cheese. Low-fat or nonfat yogurt. Low-fat, low-sodium cheese. Fats and oils Soft margarine without trans fats. Vegetable oil. Low-fat, reduced-fat, or light mayonnaise and salad dressings (reduced-sodium). Canola, safflower, olive, soybean, and sunflower oils. Avocado. Seasoning and other foods Herbs. Spices. Seasoning mixes without salt. Unsalted popcorn and pretzels. Fat-free sweets. What foods are not recommended? The items listed may not be a complete list. Talk with your dietitian about what dietary choices are best for you. Grains Baked goods  made with fat, such as croissants, muffins, or some breads. Dry pasta or rice meal packs. Vegetables Creamed or fried vegetables. Vegetables in a cheese sauce. Regular canned vegetables (not low-sodium or reduced-sodium). Regular canned tomato sauce and paste (not low-sodium or reduced-sodium). Regular tomato and vegetable juice (not low-sodium or reduced-sodium). Pickles. Olives. Fruits Canned fruit in a light or heavy syrup. Fried fruit. Fruit in cream or butter sauce. Meat and other protein foods Fatty cuts of meat. Ribs. Fried meat. Bacon. Sausage. Bologna and other processed lunch meats. Salami. Fatback. Hotdogs. Bratwurst. Salted nuts and seeds. Canned beans with added salt.   Canned or smoked fish. Whole eggs or egg yolks. Chicken or turkey with skin. Dairy Whole or 2% milk, cream, and half-and-half. Whole or full-fat cream cheese. Whole-fat or sweetened yogurt. Full-fat cheese. Nondairy creamers. Whipped toppings. Processed cheese and cheese spreads. Fats and oils Butter. Stick margarine. Lard. Shortening. Ghee. Bacon fat. Tropical oils, such as coconut, palm kernel, or palm oil. Seasoning and other foods Salted popcorn and pretzels. Onion salt, garlic salt, seasoned salt, table salt, and sea salt. Worcestershire sauce. Tartar sauce. Barbecue sauce. Teriyaki sauce. Soy sauce, including reduced-sodium. Steak sauce. Canned and packaged gravies. Fish sauce. Oyster sauce. Cocktail sauce. Horseradish that you find on the shelf. Ketchup. Mustard. Meat flavorings and tenderizers. Bouillon cubes. Hot sauce and Tabasco sauce. Premade or packaged marinades. Premade or packaged taco seasonings. Relishes. Regular salad dressings. Where to find more information:  National Heart, Lung, and Blood Institute: www.nhlbi.nih.gov  American Heart Association: www.heart.org Summary  The DASH eating plan is a healthy eating plan that has been shown to reduce high blood pressure (hypertension). It may also reduce  your risk for type 2 diabetes, heart disease, and stroke.  With the DASH eating plan, you should limit salt (sodium) intake to 2,300 mg a day. If you have hypertension, you may need to reduce your sodium intake to 1,500 mg a day.  When on the DASH eating plan, aim to eat more fresh fruits and vegetables, whole grains, lean proteins, low-fat dairy, and heart-healthy fats.  Work with your health care provider or diet and nutrition specialist (dietitian) to adjust your eating plan to your individual calorie needs. This information is not intended to replace advice given to you by your health care provider. Make sure you discuss any questions you have with your health care provider. Document Revised: 05/17/2017 Document Reviewed: 05/28/2016 Elsevier Patient Education  2020 Elsevier Inc.  

## 2019-10-21 ENCOUNTER — Telehealth: Payer: Self-pay | Admitting: *Deleted

## 2019-10-21 NOTE — Telephone Encounter (Signed)
Received referral for low dose lung cancer screening CT scan. Message left at phone number listed in EMR for patient to call me back to facilitate scheduling scan.  

## 2019-10-26 ENCOUNTER — Telehealth: Payer: Self-pay | Admitting: *Deleted

## 2019-10-26 ENCOUNTER — Encounter: Payer: Self-pay | Admitting: *Deleted

## 2019-10-26 DIAGNOSIS — Z87891 Personal history of nicotine dependence: Secondary | ICD-10-CM

## 2019-10-26 DIAGNOSIS — Z122 Encounter for screening for malignant neoplasm of respiratory organs: Secondary | ICD-10-CM

## 2019-10-26 NOTE — Telephone Encounter (Signed)
Received referral for initial lung cancer screening scan. Contacted patient and obtained smoking history,(current, 51.25 pack year) as well as answering questions related to screening process. Patient denies signs of lung cancer such as weight loss or hemoptysis. Patient denies comorbidity that would prevent curative treatment if lung cancer were found. Patient is scheduled for shared decision making visit and CT scan on 11/03/19 at 130pm.

## 2019-10-28 ENCOUNTER — Other Ambulatory Visit: Payer: Self-pay

## 2019-10-28 DIAGNOSIS — Z7689 Persons encountering health services in other specified circumstances: Secondary | ICD-10-CM

## 2019-10-28 DIAGNOSIS — Z122 Encounter for screening for malignant neoplasm of respiratory organs: Secondary | ICD-10-CM

## 2019-10-28 DIAGNOSIS — Z87891 Personal history of nicotine dependence: Secondary | ICD-10-CM

## 2019-10-28 DIAGNOSIS — Z8619 Personal history of other infectious and parasitic diseases: Secondary | ICD-10-CM

## 2019-10-29 LAB — COMPREHENSIVE METABOLIC PANEL
ALT: 15 IU/L (ref 0–44)
AST: 15 IU/L (ref 0–40)
Albumin/Globulin Ratio: 1.8 (ref 1.2–2.2)
Albumin: 5 g/dL — ABNORMAL HIGH (ref 3.8–4.9)
Alkaline Phosphatase: 77 IU/L (ref 39–117)
BUN/Creatinine Ratio: 14 (ref 9–20)
BUN: 13 mg/dL (ref 6–24)
Bilirubin Total: 0.5 mg/dL (ref 0.0–1.2)
CO2: 23 mmol/L (ref 20–29)
Calcium: 9.7 mg/dL (ref 8.7–10.2)
Chloride: 90 mmol/L — ABNORMAL LOW (ref 96–106)
Creatinine, Ser: 0.9 mg/dL (ref 0.76–1.27)
GFR calc Af Amer: 109 mL/min/{1.73_m2} (ref >59)
GFR calc non Af Amer: 94 mL/min/{1.73_m2} (ref >59)
Globulin, Total: 2.8 g/dL (ref 1.5–4.5)
Glucose: 279 mg/dL — ABNORMAL HIGH (ref 65–99)
Potassium: 4.9 mmol/L (ref 3.5–5.2)
Sodium: 130 mmol/L — ABNORMAL LOW (ref 134–144)
Total Protein: 7.8 g/dL (ref 6.0–8.5)

## 2019-10-29 LAB — HEPATITIS PANEL, ACUTE
Hep A IgM: NEGATIVE
Hep B C IgM: NEGATIVE
Hep C Virus Ab: <0.1 s/co ratio (ref 0.0–0.9)
Hepatitis B Surface Ag: NEGATIVE

## 2019-10-29 LAB — LIPID PANEL
Chol/HDL Ratio: 5.7 ratio — ABNORMAL HIGH (ref 0.0–5.0)
Cholesterol, Total: 267 mg/dL — ABNORMAL HIGH (ref 100–199)
HDL: 47 mg/dL (ref >39)
LDL Chol Calc (NIH): 129 mg/dL — ABNORMAL HIGH (ref 0–99)
Triglycerides: 501 mg/dL — ABNORMAL HIGH (ref 0–149)
VLDL Cholesterol Cal: 91 mg/dL — ABNORMAL HIGH (ref 5–40)

## 2019-10-29 LAB — URINALYSIS
Bilirubin, UA: NEGATIVE
Ketones, UA: NEGATIVE
Leukocytes,UA: NEGATIVE
Nitrite, UA: NEGATIVE
RBC, UA: NEGATIVE
Specific Gravity, UA: 1.021 (ref 1.005–1.030)
Urobilinogen, Ur: 0.2 mg/dL (ref 0.2–1.0)
pH, UA: 5.5 (ref 5.0–7.5)

## 2019-10-29 LAB — CBC WITH DIFFERENTIAL/PLATELET
Basophils Absolute: 0.1 10*3/uL (ref 0.0–0.2)
Basos: 1 %
EOS (ABSOLUTE): 0.3 10*3/uL (ref 0.0–0.4)
Eos: 3 %
Hematocrit: 46.8 % (ref 37.5–51.0)
Hemoglobin: 16.3 g/dL (ref 13.0–17.7)
Immature Grans (Abs): 0.1 10*3/uL (ref 0.0–0.1)
Immature Granulocytes: 1 %
Lymphocytes Absolute: 1.9 10*3/uL (ref 0.7–3.1)
Lymphs: 22 %
MCH: 33 pg (ref 26.6–33.0)
MCHC: 34.8 g/dL (ref 31.5–35.7)
MCV: 95 fL (ref 79–97)
Monocytes Absolute: 0.5 10*3/uL (ref 0.1–0.9)
Monocytes: 6 %
Neutrophils Absolute: 5.8 10*3/uL (ref 1.4–7.0)
Neutrophils: 67 %
Platelets: 293 10*3/uL (ref 150–450)
RBC: 4.94 x10E6/uL (ref 4.14–5.80)
RDW: 12.6 % (ref 11.6–15.4)
WBC: 8.7 10*3/uL (ref 3.4–10.8)

## 2019-10-29 LAB — MICROALBUMIN / CREATININE URINE RATIO
Creatinine, Urine: 53.5 mg/dL
Microalb/Creat Ratio: 670 mg/g creat — ABNORMAL HIGH (ref 0–29)
Microalbumin, Urine: 358.7 ug/mL

## 2019-10-29 LAB — TSH: TSH: 1.72 u[IU]/mL (ref 0.450–4.500)

## 2019-11-03 ENCOUNTER — Other Ambulatory Visit: Payer: Self-pay

## 2019-11-03 ENCOUNTER — Inpatient Hospital Stay: Payer: Disability Insurance | Attending: Oncology | Admitting: Hospice and Palliative Medicine

## 2019-11-03 ENCOUNTER — Ambulatory Visit
Admission: RE | Admit: 2019-11-03 | Discharge: 2019-11-03 | Disposition: A | Discharge status: Home / Self Care | Payer: Self-pay | Source: Ambulatory Visit | Attending: Oncology | Admitting: Oncology

## 2019-11-03 ENCOUNTER — Encounter: Payer: Self-pay | Admitting: Licensed Clinical Social Worker

## 2019-11-03 DIAGNOSIS — Z122 Encounter for screening for malignant neoplasm of respiratory organs: Secondary | ICD-10-CM | POA: Insufficient documentation

## 2019-11-03 DIAGNOSIS — Z87891 Personal history of nicotine dependence: Secondary | ICD-10-CM

## 2019-11-03 DIAGNOSIS — F17210 Nicotine dependence, cigarettes, uncomplicated: Secondary | ICD-10-CM

## 2019-11-03 NOTE — Progress Notes (Signed)
In accordance with CMS guidelines, patient has met eligibility criteria including age, absence of signs or symptoms of lung cancer.  Social History   Tobacco Use  . Smoking status: Current Every Day Smoker    Packs/day: 1.25    Years: 41.00    Pack years: 51.25    Types: Cigarettes  . Smokeless tobacco: Never Used  Substance Use Topics  . Alcohol use: Not Currently  . Drug use: Not on file      A shared decision-making session was conducted prior to the performance of CT scan. This includes one or more decision aids, includes benefits and harms of screening, follow-up diagnostic testing, over-diagnosis, false positive rate, and total radiation exposure.   Counseling on the importance of adherence to annual lung cancer LDCT screening, impact of co-morbidities, and ability or willingness to undergo diagnosis and treatment is imperative for compliance of the program.   Counseling on the importance of continued smoking cessation for former smokers; the importance of smoking cessation for current smokers, and information about tobacco cessation interventions have been given to patient including [place] and [facility] quit [place] programs.   Written order for lung cancer screening with LDCT has been given to the patient and any and all questions have been answered to the best of my abilities.    Yearly follow up will be coordinated by Burgess Estelle, Thoracic Navigator.  Time Total: 15 minutes  Visit consisted of counseling and education dealing with complex health screening. Greater than 50%  of this time was spent counseling and coordinating care related to the above assessment and plan.  Signed by: Altha Harm, PhD, NP-C [phone] (Work Cell)

## 2019-11-04 ENCOUNTER — Ambulatory Visit: Payer: Self-pay | Admitting: Gerontology

## 2019-11-04 ENCOUNTER — Encounter: Payer: Self-pay | Admitting: Gerontology

## 2019-11-04 VITALS — BP 130/83 | HR 96 | Temp 96.1°F | Ht 70.0 in | Wt 189.0 lb

## 2019-11-04 DIAGNOSIS — Z8719 Personal history of other diseases of the digestive system: Secondary | ICD-10-CM

## 2019-11-04 DIAGNOSIS — R0602 Shortness of breath: Secondary | ICD-10-CM

## 2019-11-04 DIAGNOSIS — F17200 Nicotine dependence, unspecified, uncomplicated: Secondary | ICD-10-CM

## 2019-11-04 DIAGNOSIS — E1140 Type 2 diabetes mellitus with diabetic neuropathy, unspecified: Secondary | ICD-10-CM

## 2019-11-04 DIAGNOSIS — G6289 Other specified polyneuropathies: Secondary | ICD-10-CM

## 2019-11-04 DIAGNOSIS — I10 Essential (primary) hypertension: Secondary | ICD-10-CM

## 2019-11-04 DIAGNOSIS — IMO0001 Reserved for inherently not codable concepts without codable children: Secondary | ICD-10-CM

## 2019-11-04 MED ORDER — METFORMIN HCL 500 MG PO TABS: 1000.0000 mg | ORAL_TABLET | Freq: Two times a day (BID) | ORAL | 2 refills | Status: DC

## 2019-11-04 MED ORDER — ALBUTEROL SULFATE HFA 108 (90 BASE) MCG/ACT IN AERS: 1.0000 | INHALATION_SPRAY | Freq: Four times a day (QID) | RESPIRATORY_TRACT | 2 refills | Status: DC | PRN

## 2019-11-04 MED ORDER — FLUTICASONE-SALMETEROL 250-50 MCG/DOSE IN AEPB: 1.0000 | INHALATION_SPRAY | Freq: Two times a day (BID) | RESPIRATORY_TRACT | 1 refills | Status: DC

## 2019-11-04 MED ORDER — GABAPENTIN 300 MG PO CAPS: 300.0000 mg | ORAL_CAPSULE | Freq: Every day | ORAL | 2 refills | Status: DC

## 2019-11-04 MED ORDER — AMLODIPINE BESYLATE 5 MG PO TABS: 5.0000 mg | ORAL_TABLET | Freq: Every day | ORAL | 3 refills | Status: DC

## 2019-11-04 MED ORDER — ESOMEPRAZOLE MAGNESIUM 20 MG PO CPDR: 20.0000 mg | DELAYED_RELEASE_CAPSULE | Freq: Every day | ORAL | 3 refills | Status: DC

## 2019-11-04 NOTE — Patient Instructions (Signed)
Smoking Tobacco Information, Adult Smoking tobacco can be harmful to your health. Tobacco contains a poisonous (toxic), colorless chemical called nicotine. Nicotine is addictive. It changes the brain and can make it hard to stop smoking. Tobacco also has other toxic chemicals that can hurt your body and raise your risk of many cancers. How can smoking tobacco affect me? Smoking tobacco puts you at risk for:  Cancer. Smoking is most commonly associated with lung cancer, but can also lead to cancer in other parts of the body.  Chronic obstructive pulmonary disease (COPD). This is a long-term lung condition that makes it hard to breathe. It also gets worse over time.  High blood pressure (hypertension), heart disease, stroke, or heart attack.  Lung infections, such as pneumonia.  Cataracts. This is when the lenses in the eyes become clouded.  Digestive problems. This may include peptic ulcers, heartburn, and gastroesophageal reflux disease (GERD).  Oral health problems, such as gum disease and tooth loss.  Loss of taste and smell. Smoking can affect your appearance by causing:  Wrinkles.  Yellow or stained teeth, fingers, and fingernails. Smoking tobacco can also affect your social life, because:  It may be challenging to find places to smoke when away from home. Many workplaces, restaurants, hotels, and public places are tobacco-free.  Smoking is expensive. This is due to the cost of tobacco and the long-term costs of treating health problems from smoking.  Secondhand smoke may affect those around you. Secondhand smoke can cause lung cancer, breathing problems, and heart disease. Children of smokers have a higher risk for: ? Sudden infant death syndrome (SIDS). ? Ear infections. ? Lung infections. If you currently smoke tobacco, quitting now can help you:  Lead a longer and healthier life.  Look, smell, breathe, and feel better over time.  Save money.  Protect others from the  harms of secondhand smoke. What actions can I take to prevent health problems? Quit smoking   Do not start smoking. Quit if you already do.  Make a plan to quit smoking and commit to it. Look for programs to help you and ask your health care provider for recommendations and ideas.  Set a date and write down all the reasons you want to quit.  Let your friends and family know you are quitting so they can help and support you. Consider finding friends who also want to quit. It can be easier to quit with someone else, so that you can support each other.  Talk with your health care provider about using nicotine replacement medicines to help you quit, such as gum, lozenges, patches, sprays, or pills.  Do not replace cigarette smoking with electronic cigarettes, which are commonly called e-cigarettes. The safety of e-cigarettes is not known, and some may contain harmful chemicals.  If you try to quit but return to smoking, stay positive. It is common to slip up when you first quit, so take it one day at a time.  Be prepared for cravings. When you feel the urge to smoke, chew gum or suck on hard candy. Lifestyle  Stay busy and take care of your body.  Drink enough fluid to keep your urine pale yellow.  Get plenty of exercise and eat a healthy diet. This can help prevent weight gain after quitting.  Monitor your eating habits. Quitting smoking can cause you to have a larger appetite than when you smoke.  Find ways to relax. Go out with friends or family to a movie or a restaurant   where people do not smoke.  Ask your health care provider about having regular tests (screenings) to check for cancer. This may include blood tests, imaging tests, and other tests.  Find ways to manage your stress, such as meditation, yoga, or exercise. Where to find support To get support to quit smoking, consider:  Asking your health care provider for more information and resources.  Taking classes to learn  more about quitting smoking.  Looking for local organizations that offer resources about quitting smoking.  Joining a support group for people who want to quit smoking in your local community.  Calling the smokefree.gov counselor helpline: 1-800-Quit-Now (1-800-784-8669) Where to find more information You may find more information about quitting smoking from:  HelpGuide.org: www.helpguide.org  Smokefree.gov: smokefree.gov  American Lung Association: www.lung.org Contact a health care provider if you:  Have problems breathing.  Notice that your lips, nose, or fingers turn blue.  Have chest pain.  Are coughing up blood.  Feel faint or you pass out.  Have other health changes that cause you to worry. Summary  Smoking tobacco can negatively affect your health, the health of those around you, your finances, and your social life.  Do not start smoking. Quit if you already do. If you need help quitting, ask your health care provider.  Think about joining a support group for people who want to quit smoking in your local community. There are many effective programs that will help you to quit this behavior. This information is not intended to replace advice given to you by your health care provider. Make sure you discuss any questions you have with your health care provider. Document Revised: 02/27/2019 Document Reviewed: 06/19/2016 Elsevier Patient Education  2020 [employer] DASH Eating Plan DASH stands for "Dietary Approaches to Stop Hypertension." The DASH eating plan is a healthy eating plan that has been shown to reduce high blood pressure (hypertension). It may also reduce your risk for type 2 diabetes, heart disease, and stroke. The DASH eating plan may also help with weight loss. What are tips for following this plan?  General guidelines  Avoid eating more than 2,300 mg (milligrams) of salt (sodium) a day. If you have hypertension, you may need to reduce your sodium  intake to 1,500 mg a day.  Limit alcohol intake to no more than 1 drink a day for nonpregnant women and 2 drinks a day for men. One drink equals 12 oz of beer, 5 oz of wine, or 1 oz of hard liquor.  Work with your health care provider to maintain a healthy body weight or to lose weight. Ask what an ideal weight is for you.  Get at least 30 minutes of exercise that causes your heart to beat faster (aerobic exercise) most days of the week. Activities may include walking, swimming, or biking.  Work with your health care provider or diet and nutrition specialist (dietitian) to adjust your eating plan to your individual calorie needs. Reading food labels   Check food labels for the amount of sodium per serving. Choose foods with less than 5 percent of the Daily Value of sodium. Generally, foods with less than 300 mg of sodium per serving fit into this eating plan.  To find whole grains, look for the word "whole" as the first word in the ingredient list. Shopping  Buy products labeled as "low-sodium" or "no salt added."  Buy fresh foods. Avoid canned foods and premade or frozen meals. Cooking  Avoid adding salt when cooking. Use   salt-free seasonings or herbs instead of table salt or sea salt. Check with your health care provider or pharmacist before using salt substitutes.  Do not fry foods. Cook foods using healthy methods such as baking, boiling, grilling, and broiling instead.  Cook with heart-healthy oils, such as olive, canola, soybean, or sunflower oil. Meal planning  Eat a balanced diet that includes: ? 5 or more servings of fruits and vegetables each day. At each meal, try to fill half of your plate with fruits and vegetables. ? Up to 6-8 servings of whole grains each day. ? Less than 6 oz of lean meat, poultry, or fish each day. A 3-oz serving of meat is about the same size as a deck of cards. One egg equals 1 oz. ? 2 servings of low-fat dairy each day. ? A serving of nuts,  seeds, or beans 5 times each week. ? Heart-healthy fats. Healthy fats called Omega-3 fatty acids are found in foods such as flaxseeds and coldwater fish, like sardines, salmon, and mackerel.  Limit how much you eat of the following: ? Canned or prepackaged foods. ? Food that is high in trans fat, such as fried foods. ? Food that is high in saturated fat, such as fatty meat. ? Sweets, desserts, sugary drinks, and other foods with added sugar. ? Full-fat dairy products.  Do not salt foods before eating.  Try to eat at least 2 vegetarian meals each week.  Eat more home-cooked food and less restaurant, buffet, and fast food.  When eating at a restaurant, ask that your food be prepared with less salt or no salt, if possible. What foods are recommended? The items listed may not be a complete list. Talk with your dietitian about what dietary choices are best for you. Grains Whole-grain or whole-wheat bread. Whole-grain or whole-wheat pasta. Chrles Selley rice. Oatmeal. Quinoa. Bulgur. Whole-grain and low-sodium cereals. Pita bread. Low-fat, low-sodium crackers. Whole-wheat flour tortillas. Vegetables Fresh or frozen vegetables (raw, steamed, roasted, or grilled). Low-sodium or reduced-sodium tomato and vegetable juice. Low-sodium or reduced-sodium tomato sauce and tomato paste. Low-sodium or reduced-sodium canned vegetables. Fruits All fresh, dried, or frozen fruit. Canned fruit in natural juice (without added sugar). Meat and other protein foods Skinless chicken or turkey. Ground chicken or turkey. Pork with fat trimmed off. Fish and seafood. Egg whites. Dried beans, peas, or lentils. Unsalted nuts, nut butters, and seeds. Unsalted canned beans. Lean cuts of beef with fat trimmed off. Low-sodium, lean deli meat. Dairy Low-fat (1%) or fat-free (skim) milk. Fat-free, low-fat, or reduced-fat cheeses. Nonfat, low-sodium ricotta or cottage cheese. Low-fat or nonfat yogurt. Low-fat, low-sodium cheese. Fats  and oils Soft margarine without trans fats. Vegetable oil. Low-fat, reduced-fat, or light mayonnaise and salad dressings (reduced-sodium). Canola, safflower, olive, soybean, and sunflower oils. Avocado. Seasoning and other foods Herbs. Spices. Seasoning mixes without salt. Unsalted popcorn and pretzels. Fat-free sweets. What foods are not recommended? The items listed may not be a complete list. Talk with your dietitian about what dietary choices are best for you. Grains Baked goods made with fat, such as croissants, muffins, or some breads. Dry pasta or rice meal packs. Vegetables Creamed or fried vegetables. Vegetables in a cheese sauce. Regular canned vegetables (not low-sodium or reduced-sodium). Regular canned tomato sauce and paste (not low-sodium or reduced-sodium). Regular tomato and vegetable juice (not low-sodium or reduced-sodium). Pickles. Olives. Fruits Canned fruit in a light or heavy syrup. Fried fruit. Fruit in cream or butter sauce. Meat and other protein foods Fatty cuts   of meat. Ribs. Fried meat. Bacon. Sausage. Bologna and other processed lunch meats. Salami. Fatback. Hotdogs. Bratwurst. Salted nuts and seeds. Canned beans with added salt. Canned or smoked fish. Whole eggs or egg yolks. Chicken or turkey with skin. Dairy Whole or 2% milk, cream, and half-and-half. Whole or full-fat cream cheese. Whole-fat or sweetened yogurt. Full-fat cheese. Nondairy creamers. Whipped toppings. Processed cheese and cheese spreads. Fats and oils Butter. Stick margarine. Lard. Shortening. Ghee. Bacon fat. Tropical oils, such as coconut, palm kernel, or palm oil. Seasoning and other foods Salted popcorn and pretzels. Onion salt, garlic salt, seasoned salt, table salt, and sea salt. Worcestershire sauce. Tartar sauce. Barbecue sauce. Teriyaki sauce. Soy sauce, including reduced-sodium. Steak sauce. Canned and packaged gravies. Fish sauce. Oyster sauce. Cocktail sauce. Horseradish that you find on  the shelf. Ketchup. Mustard. Meat flavorings and tenderizers. Bouillon cubes. Hot sauce and Tabasco sauce. Premade or packaged marinades. Premade or packaged taco seasonings. Relishes. Regular salad dressings. Where to find more information:  National Heart, Lung, and Blood Institute: www.nhlbi.nih.gov  American Heart Association: www.heart.org Summary  The DASH eating plan is a healthy eating plan that has been shown to reduce high blood pressure (hypertension). It may also reduce your risk for type 2 diabetes, heart disease, and stroke.  With the DASH eating plan, you should limit salt (sodium) intake to 2,300 mg a day. If you have hypertension, you may need to reduce your sodium intake to 1,500 mg a day.  When on the DASH eating plan, aim to eat more fresh fruits and vegetables, whole grains, lean proteins, low-fat dairy, and heart-healthy fats.  Work with your health care provider or diet and nutrition specialist (dietitian) to adjust your eating plan to your individual calorie needs. This information is not intended to replace advice given to you by your health care provider. Make sure you discuss any questions you have with your health care provider. Document Revised: 05/17/2017 Document Reviewed: 05/28/2016 Elsevier Patient Education  2020 [employer]. Carbohydrate Counting for Diabetes Mellitus, Adult  Carbohydrate counting is a method of keeping track of how many carbohydrates you eat. Eating carbohydrates naturally increases the amount of sugar (glucose) in the blood. Counting how many carbohydrates you eat helps keep your blood glucose within normal limits, which helps you manage your diabetes (diabetes mellitus). It is important to know how many carbohydrates you can safely have in each meal. This is different for every person. A diet and nutrition specialist (registered dietitian) can help you make a meal plan and calculate how many carbohydrates you should have at each meal and  snack. Carbohydrates are found in the following foods:  Grains, such as breads and cereals.  Dried beans and soy products.  Starchy vegetables, such as potatoes, peas, and corn.  Fruit and fruit juices.  Milk and yogurt.  Sweets and snack foods, such as cake, cookies, candy, chips, and soft drinks. How do I count carbohydrates? There are two ways to count carbohydrates in food. You can use either of the methods or a combination of both. Reading "Nutrition Facts" on packaged food The "Nutrition Facts" list is included on the labels of almost all packaged foods and beverages in the [place] It includes:  The serving size.  Information about nutrients in each serving, including the grams (g) of carbohydrate per serving. To use the "Nutrition Facts":  Decide how many servings you will have.  Multiply the number of servings by the number of carbohydrates per serving.  The resulting number   is the total amount of carbohydrates that you will be having. Learning standard serving sizes of other foods When you eat carbohydrate foods that are not packaged or do not include "Nutrition Facts" on the label, you need to measure the servings in order to count the amount of carbohydrates:  Measure the foods that you will eat with a food scale or measuring cup, if needed.  Decide how many standard-size servings you will eat.  Multiply the number of servings by 15. Most carbohydrate-rich foods have about 15 g of carbohydrates per serving. ? For example, if you eat 8 oz (170 g) of strawberries, you will have eaten 2 servings and 30 g of carbohydrates (2 servings x 15 g = 30 g).  For foods that have more than one food mixed, such as soups and casseroles, you must count the carbohydrates in each food that is included. The following list contains standard serving sizes of common carbohydrate-rich foods. Each of these servings has about 15 g of carbohydrates:   hamburger bun or  English muffin.    oz (15 mL) syrup.   oz (14 g) jelly.  1 slice of bread.  1 six-inch tortilla.  3 oz (85 g) cooked rice or pasta.  4 oz (113 g) cooked dried beans.  4 oz (113 g) starchy vegetable, such as peas, corn, or potatoes.  4 oz (113 g) hot cereal.  4 oz (113 g) mashed potatoes or  of a large baked potato.  4 oz (113 g) canned or frozen fruit.  4 oz (120 mL) fruit juice.  4-6 crackers.  6 chicken nuggets.  6 oz (170 g) unsweetened dry cereal.  6 oz (170 g) plain fat-free yogurt or yogurt sweetened with artificial sweeteners.  8 oz (240 mL) milk.  8 oz (170 g) fresh fruit or one small piece of fruit.  24 oz (680 g) popped popcorn. Example of carbohydrate counting Sample meal  3 oz (85 g) chicken breast.  6 oz (170 g) [place] M[place] rice.  4 oz (113 g) corn.  8 oz (240 mL) milk.  8 oz (170 g) strawberries with sugar-free whipped topping. Carbohydrate calculation 1. Identify the foods that contain carbohydrates: ? Rice. ? Corn. ? Milk. ? Strawberries. 2. Calculate how many servings you have of each food: ? 2 servings rice. ? 1 serving corn. ? 1 serving milk. ? 1 serving strawberries. 3. Multiply each number of servings by 15 g: ? 2 servings rice x 15 g = 30 g. ? 1 serving corn x 15 g = 15 g. ? 1 serving milk x 15 g = 15 g. ? 1 serving strawberries x 15 g = 15 g. 4. Add together all of the amounts to find the total grams of carbohydrates eaten: ? 30 g + 15 g + 15 g + 15 g = 75 g of carbohydrates total. Summary  Carbohydrate counting is a method of keeping track of how many carbohydrates you eat.  Eating carbohydrates naturally increases the amount of sugar (glucose) in the blood.  Counting how many carbohydrates you eat helps keep your blood glucose within normal limits, which helps you manage your diabetes.  A diet and nutrition specialist (registered dietitian) can help you make a meal plan and calculate how many carbohydrates you should have at each meal and  snack. This information is not intended to replace advice given to you by your health care provider. Make sure you discuss any questions you have with your health care provider.   Document Revised: 12/27/2016 Document Reviewed: 11/16/2015 Elsevier Patient Education  2020 [employer]  

## 2019-11-04 NOTE — Progress Notes (Signed)
Established Patient Office Visit  Subjective:  Patient ID: Timothy Storie., male    DOB: 26-Aug-1960  Age: 59 y.o. MRN: [id number]  CC: No chief complaint on file.   HPI Timothy [name]. presents for follow up of hypertension, type 2 DM, Neuropathy,  and lab review. His HgbA1c was 8.1% and he states that he checks his blood glucose bid, and his readings ranges between 200-350 mg/dl. He states that he's compliant with his medications. He denies hypoglycemic symptoms, states that his peripheral neuropathy was not controlled with taking 100 mg of gabapentin and he was taking 300 mg with relief. He states that his acid reflux is under control with taking 20 mg Esomeprazole. He states that he checks his blood pressure daily and his readings  Ranges between 140- 150/80-90. He had low dose CT scan done on 11/03/2019 and it showed benign appearance or behavior. Continue annual screening with low-dose chest CT without contrast in 12 months. One vessel coronary atherosclerosis. Aortic Atherosclerosis (ICD10-I70.0) and Emphysema (ICD10-J43.9) per Dr Santiago Bumpers. He continues to smoke 1 1/2 packs of cigarette and denies the desire to quit. He states that he experiences intermittent shortness of breath with exertion and uses Albuterol and Advair he received from friends. His Lipid panel done on 10/28/2019, LDL was 129 mg/dl. Overall, he states that he's doing well and offers no further complaint.  Past Medical History:  Diagnosis Date  . Diabetes mellitus without complication ([place])   . Hypercholesteremia   . Hypertension     Past Surgical History:  Procedure Laterality Date  . TONSILLECTOMY      No family history on file.  Social History   Socioeconomic History  . Marital status: Single    Spouse name: Not on file  . Number of children: Not on file  . Years of education: Not on file  . Highest education level: Not on file  Occupational History  . Not on file  Tobacco Use  . Smoking  status: Current Every Day Smoker    Packs/day: 1.25    Years: 41.00    Pack years: 51.25    Types: Cigarettes  . Smokeless tobacco: Never Used  Substance and Sexual Activity  . Alcohol use: Not Currently  . Drug use: Not Currently  . Sexual activity: Not on file  Other Topics Concern  . Not on file  Social History Narrative  . Not on file   Social Determinants of Health   Financial Resource Strain:   . Difficulty of Paying Living Expenses:   Food Insecurity:   . Worried About Charity fundraiser in the Last Year:   . Arboriculturist in the Last Year:   Transportation Needs:   . Film/video editor (Medical):   Marland Kitchen Lack of Transportation (Non-Medical):   Physical Activity:   . Days of Exercise per Week:   . Minutes of Exercise per Session:   Stress:   . Feeling of Stress :   Social Connections:   . Frequency of Communication with Friends and Family:   . Frequency of Social Gatherings with Friends and Family:   . Attends Religious Services:   . Active Member of Clubs or Organizations:   . Attends Archivist Meetings:   Marland Kitchen Marital Status:   Intimate Partner Violence:   . Fear of Current or Ex-Partner:   . Emotionally Abused:   Marland Kitchen Physically Abused:   . Sexually Abused:     Outpatient Medications  Prior to Visit  Medication Sig Dispense Refill  . Acetaminophen (ARTHRITIS PAIN PO) Take 1,000 mg by mouth daily.    . calcium carbonate (TUMS - DOSED IN MG ELEMENTAL CALCIUM) 500 MG chewable tablet Chew by mouth.    . citalopram (CELEXA) 20 MG tablet Take 20 mg by mouth daily.    . cloNIDine (CATAPRES) 0.1 MG tablet Take 0.1 mg by mouth at bedtime.    Marland Kitchen ibuprofen (ADVIL) 200 MG tablet Take 1,000 mg by mouth daily.    . Magnesium 400 MG CAPS Take 400 mg by mouth daily.    Marland Kitchen amLODipine (NORVASC) 5 MG tablet Take 1 tablet (5 mg total) by mouth daily. 30 tablet 0  . esomeprazole (NEXIUM) 20 MG capsule Take 1 capsule (20 mg total) by mouth daily at 12 noon. 30 capsule 0   . gabapentin (NEURONTIN) 100 MG capsule Take 1 capsule (100 mg total) by mouth at bedtime. 30 capsule 0  . metFORMIN (GLUCOPHAGE) 500 MG tablet Take 1 tablet (500 mg total) by mouth daily with breakfast. 30 tablet 4  . blood glucose meter kit and supplies KIT Dispense based on patient and insurance preference. Use up to four times daily as directed. (FOR ICD-9 250.00, 250.01). 1 each 0   No facility-administered medications prior to visit.    Allergies  Allergen Reactions  . Bee Venom Anaphylaxis  . Coffee Flavor Shortness Of Breath  . Cheese Swelling    Blue Cheese only  . Penicillins Other (See Comments)    unknown    ROS Review of Systems  Constitutional: Negative.   Eyes: Negative.   Respiratory: Positive for shortness of breath. Negative for cough and chest tightness.   Cardiovascular: Negative.   Endocrine: Negative.   Neurological: Negative.   Psychiatric/Behavioral: Negative.       Objective:    Physical Exam  Constitutional: He is oriented to person, place, and time. He appears well-developed.  HENT:  Head: Normocephalic and atraumatic.  Eyes: Pupils are equal, round, and reactive to light. EOM are normal.  Cardiovascular: Normal rate and regular rhythm.  Pulmonary/Chest: Effort normal and breath sounds normal.  Neurological: He is alert and oriented to person, place, and time.  Psychiatric: He has a normal mood and affect. His behavior is normal. Judgment and thought content normal.    BP 130/83 (BP Location: Left Arm, Patient Position: Sitting)   Pulse 96   Temp (!) 96.1 F (35.6 C)   Ht _0  (1.778 m)   Wt 189 lb (85.7 kg)   SpO2 96%   BMI 27.12 kg/m  Wt Readings from Last 3 Encounters:  11/04/19 189 lb (85.7 kg)  11/03/19 190 lb (86.2 kg)  10/28/19 191 lb 9.6 oz (86.9 kg)     Health Maintenance Due  Topic Date Due  . FOOT EXAM  Never done  . OPHTHALMOLOGY EXAM  Never done  . HIV Screening  Never done  . COVID-19 Vaccine (1) Never done   . TETANUS/TDAP  Never done  . COLONOSCOPY  Never done    There are no preventive care reminders to display for this patient.  Lab Results  Component Value Date   TSH 1.720 10/28/2019   Lab Results  Component Value Date   WBC 8.7 10/28/2019   HGB 16.3 10/28/2019   HCT 46.8 10/28/2019   MCV 95 10/28/2019   PLT 293 10/28/2019   Lab Results  Component Value Date   NA 130 (L) 10/28/2019   K 4.9  10/28/2019   CO2 23 10/28/2019   GLUCOSE 279 (H) 10/28/2019   BUN 13 10/28/2019   CREATININE 0.90 10/28/2019   BILITOT 0.5 10/28/2019   ALKPHOS 77 10/28/2019   AST 15 10/28/2019   ALT 15 10/28/2019   PROT 7.8 10/28/2019   ALBUMIN 5.0 (H) 10/28/2019   CALCIUM 9.7 10/28/2019   ANIONGAP 8 08/16/2019   Lab Results  Component Value Date   CHOL 267 (H) 10/28/2019   Lab Results  Component Value Date   HDL 47 10/28/2019   Lab Results  Component Value Date   LDLCALC 129 (H) 10/28/2019   Lab Results  Component Value Date   TRIG 501 (H) 10/28/2019   Lab Results  Component Value Date   CHOLHDL 5.7 (H) 10/28/2019   Lab Results  Component Value Date   HGBA1C 8.1 (A) 10/15/2019      Assessment & Plan:    1. Essential hypertension - His blood pressure was under control, he will continue on current medication regimen, was advised to continue on DASH diet and exercise as tolerated. - amLODipine (NORVASC) 5 MG tablet; Take 1 tablet (5 mg total) by mouth daily.  Dispense: 30 tablet; Refill: 3  2. History of gastroesophageal reflux (GERD) - His acid reflux is under control and he will continue on current treatment regimen. -Avoid spicy, fatty and fried food -Avoid sodas and sour juices -Avoid heavy meals -Avoid eating 4 hours before bedtime -Elevate head of bed at night  esomeprazole (NEXIUM) 20 MG capsule; Take 1 capsule (20 mg total) by mouth daily at 12 noon.  Dispense: 30 capsule; Refill: 3  3. Other polyneuropathy - His neuropathy is not controlled and his  gabapentin  was increased to 300 mg daily. - gabapentin (NEURONTIN) 300 MG capsule; Take 1 capsule (300 mg total) by mouth daily.  Dispense: 30 capsule; Refill: 2  4. Type 2 diabetes mellitus with diabetic neuropathy, without long-term current use of insulin (HCC) - His HgbA1c was 8.1% and his goal is less than 7%, his Metformin was increased to 1000 mg bid. He was advised to check blood glucose bid, record and bring log to visit. His fasting reading goal should be between 80-130 mg/dl. He was advised to continue on low carb/ non concentrated sweet diet. - gabapentin (NEURONTIN) 300 MG capsule; Take 1 capsule (300 mg total) by mouth daily.  Dispense: 30 capsule; Refill: 2 - metFORMIN (GLUCOPHAGE) 500 MG tablet; Take 2 tablets (1,000 mg total) by mouth 2 (two) times daily with a meal.  Dispense: 60 tablet; Refill: 2 - HgB A1c; Future - Urinalysis; Future  5. Smoking - He was encouraged on smoking cessation and was provided with [place] Quit line information.  6. Shortness of breath - Ddx COPD with smoking history, he will continue on inhalers, educated on medication side effects and advised to notify clinic. - albuterol (VENTOLIN HFA) 108 (90 Base) MCG/ACT inhaler; Inhale 1 puff into the lungs every 6 (six) hours as needed for wheezing or shortness of breath.  Dispense: 18 g; Refill: 2 - Fluticasone-Salmeterol (ADVAIR) 250-50 MCG/DOSE AEPB; Inhale 1 puff into the lungs 2 (two) times daily.  Dispense: 60 each; Refill: 1    Follow-up: Return in about 11 weeks (around 01/20/2020), or if symptoms worsen or fail to improve.    Raynie Steinhaus Jerold Coombe, NP

## 2019-11-06 ENCOUNTER — Encounter: Payer: Self-pay | Admitting: *Deleted

## 2019-11-11 ENCOUNTER — Telehealth: Payer: Self-pay | Admitting: Pharmacist

## 2019-11-11 NOTE — Telephone Encounter (Signed)
11/11/2019 10:32:12 AM - Advair 250/50 & Ventolin to pt & Dr  -- Rhetta Mura - Wednesday, Nov 11, 2019 10:30 AM --Received pharmacy printouts for new meds: Advair 250/50 Inhale 1 puff into the lungs two times a day # 3 & Ventolin HFA Inhale 1 puff into the lungs every 6 hours as needed for wheezing or shortness of breath #3. Printed [employer] application-mailing patient his portion to sign & return, also will get scripts to [facility] for provider to sign.

## 2019-11-23 ENCOUNTER — Telehealth: Payer: Self-pay | Admitting: Pharmacist

## 2019-11-23 NOTE — Telephone Encounter (Signed)
11/23/2019 9:35:46 AM - Advair & Ventolin pending  -- Rhetta Mura - Monday, November 23, 2019 9:34 AM --I have received the signed scripts back from provider for Advair & Ventolin, holding for patient to return his portion, mailed to patient 11/11/2019.

## 2019-12-15 ENCOUNTER — Other Ambulatory Visit: Payer: Self-pay

## 2019-12-15 ENCOUNTER — Ambulatory Visit (INDEPENDENT_AMBULATORY_CARE_PROVIDER_SITE_OTHER): Payer: Disability Insurance | Admitting: Gastroenterology

## 2019-12-15 ENCOUNTER — Encounter: Payer: Self-pay | Admitting: Gastroenterology

## 2019-12-15 VITALS — BP 112/74 | HR 76 | Ht 70.0 in | Wt 187.6 lb

## 2019-12-15 DIAGNOSIS — B182 Chronic viral hepatitis C: Secondary | ICD-10-CM

## 2019-12-15 NOTE — Progress Notes (Signed)
Gastroenterology Consultation  Referring Provider:     Langston Reusing, NP Primary Care Physician:  [facility] [place] Primary Gastroenterologist:  Dr. Allen Norris     Reason for Consultation:     Hepatitis C and a history of colon polyps        HPI:   Timothy [name]. is a 59 y.o. y/o male referred for consultation & management of hepatitis C and a history of colon polyps by Dr. Domingo Madeira, [place].  This patient comes in today after being told at his doctor's office in [place] that he had hepatitis C and then reports that further testing said that he did not have hepatitis C.  The patient has a history of being in a relationship with his partner who is hepatitis C positive.  He is no longer in this relationship.  He also reports that he had a colonoscopy 5 years ago and was told that he needs a repeat colonoscopy in 5 years.  The patient liver enzymes have shown:  Component     Latest Ref Rng & Units 08/16/2019 10/28/2019  Total Bilirubin     0.0 - 1.2 mg/dL 1.0 0.5  Alkaline Phosphatase     39 - 117 IU/L 66 77  AST     0 - 40 IU/L 16 15  ALT     0 - 44 IU/L 13 15     Past Medical History:  Diagnosis Date  . Diabetes mellitus without complication ([place])   . Hypercholesteremia   . Hypertension     Past Surgical History:  Procedure Laterality Date  . TONSILLECTOMY      Prior to Admission medications   Medication Sig Start Date End Date Taking? Authorizing Provider  Acetaminophen (ARTHRITIS PAIN PO) Take 1,000 mg by mouth daily.   Yes [provider]  albuterol (VENTOLIN HFA) 108 (90 Base) MCG/ACT inhaler Inhale 1 puff into the lungs every 6 (six) hours as needed for wheezing or shortness of breath. 11/04/19  Yes Iloabachie, Chioma E, NP  amLODipine (NORVASC) 5 MG tablet Take 1 tablet (5 mg total) by mouth daily. 11/04/19  Yes Iloabachie, Chioma E, NP  blood glucose meter kit and supplies KIT Dispense based on patient and  insurance preference. Use up to four times daily as directed. (FOR ICD-9 250.00, 250.01). 10/15/19  Yes Iloabachie, Chioma E, NP  calcium carbonate (TUMS - DOSED IN MG ELEMENTAL CALCIUM) 500 MG chewable tablet Chew by mouth.   Yes [provider]  citalopram (CELEXA) 20 MG tablet Take 20 mg by mouth daily.   Yes [provider]  cloNIDine (CATAPRES) 0.1 MG tablet Take 0.1 mg by mouth at bedtime.   Yes [provider]  esomeprazole (NEXIUM) 20 MG capsule Take 1 capsule (20 mg total) by mouth daily at 12 noon. 11/04/19  Yes Iloabachie, Chioma E, NP  Fluticasone-Salmeterol (ADVAIR) 250-50 MCG/DOSE AEPB Inhale 1 puff into the lungs 2 (two) times daily. 11/04/19  Yes Iloabachie, Chioma E, NP  gabapentin (NEURONTIN) 300 MG capsule Take 1 capsule (300 mg total) by mouth daily. 11/04/19  Yes Iloabachie, Chioma E, NP  ibuprofen (ADVIL) 200 MG tablet Take 1,000 mg by mouth daily.   Yes [provider]  Magnesium 400 MG CAPS Take 400 mg by mouth daily.   Yes [provider]  metFORMIN (GLUCOPHAGE) 500 MG tablet Take 2 tablets (1,000 mg total) by mouth 2 (two) times daily with a meal. 11/04/19 11/03/20 Yes  Iloabachie, Chioma E, NP    History reviewed. No pertinent family history.   Social History   Tobacco Use  . Smoking status: Current Every Day Smoker    Packs/day: 1.25    Years: 41.00    Pack years: 51.25    Types: Cigarettes  . Smokeless tobacco: Never Used  Vaping Use  . Vaping Use: Never used  Substance Use Topics  . Alcohol use: Not Currently  . Drug use: Not Currently    Allergies as of 12/15/2019 - Review Complete 12/15/2019  Allergen Reaction Noted  . Bee venom Anaphylaxis 06/17/2019  . Coffee flavor Shortness Of Breath 06/17/2019  . Cheese Swelling 06/17/2019  . Penicillins Other (See Comments) 06/17/2019    Review of Systems:    All systems reviewed and negative except where noted in HPI.   Physical Exam:  BP 112/74   Pulse 76   Ht 5'  10" (1.778 m)   Wt 187 lb 9.6 oz (85.1 kg)   BMI 26.92 kg/m  No LMP for male patient. General:   Alert,  Well-developed, well-nourished, pleasant and cooperative in NAD Head:  Normocephalic and atraumatic. Eyes:  Sclera clear, no icterus.   Conjunctiva pink. Ears:  Normal auditory acuity. Neck:  Supple; no masses or thyromegaly. Lungs:  Respirations even and unlabored.  Clear throughout to auscultation.   No wheezes, crackles, or rhonchi. No acute distress. Heart:  Regular rate and rhythm; no murmurs, clicks, rubs, or gallops. Abdomen:  Normal bowel sounds.  No bruits.  Soft, non-tender and non-distended without masses, hepatosplenomegaly or hernias noted.  No guarding or rebound tenderness.  Negative Carnett sign.   Rectal:  Deferred.  Pulses:  Normal pulses noted. Extremities:  No clubbing or edema.  No cyanosis. Neurologic:  Alert and oriented x3;  grossly normal neurologically. Skin:  Intact without significant lesions or rashes.  No jaundice. Lymph Nodes:  No significant cervical adenopathy. Psych:  Alert and cooperative. Normal mood and affect.  Imaging Studies: No results found.  Assessment and Plan:   [name]. is a 59 y.o. y/o male who comes in today with a history of colon polyps and will be set up for repeat colonoscopy for surveillance.  The patient also has been told that he did and then he was told he did not have hepatitis C.  The patient will have his labs checked for a hepatitis C antibody and for a hepatitis C viral load.  The patient has been explained the plan and agrees with it.    Timothy Lame, MD. Marval Regal    Note: This dictation was prepared with Dragon dictation along with smaller phrase technology. Any transcriptional errors that result from this process are unintentional.

## 2019-12-15 NOTE — H&P (View-Only) (Signed)
Gastroenterology Consultation  Referring Provider:     Langston Reusing, NP Primary Care Physician:  [facility] [place] Primary Gastroenterologist:  Dr. Allen Norris     Reason for Consultation:     Hepatitis C and a history of colon polyps        HPI:   Timothy Mcmanaman. is a 59 y.o. y/o male referred for consultation & management of hepatitis C and a history of colon polyps by Dr. Domingo Madeira, [place].  This patient comes in today after being told at his doctor's office in [place] that he had hepatitis C and then reports that further testing said that he did not have hepatitis C.  The patient has a history of being in a relationship with his partner who is hepatitis C positive.  He is no longer in this relationship.  He also reports that he had a colonoscopy 5 years ago and was told that he needs a repeat colonoscopy in 5 years.  The patient liver enzymes have shown:  Component     Latest Ref Rng & Units 08/16/2019 10/28/2019  Total Bilirubin     0.0 - 1.2 mg/dL 1.0 0.5  Alkaline Phosphatase     39 - 117 IU/L 66 77  AST     0 - 40 IU/L 16 15  ALT     0 - 44 IU/L 13 15     Past Medical History:  Diagnosis Date  . Diabetes mellitus without complication ([place])   . Hypercholesteremia   . Hypertension     Past Surgical History:  Procedure Laterality Date  . TONSILLECTOMY      Prior to Admission medications   Medication Sig Start Date End Date Taking? Authorizing Provider  Acetaminophen (ARTHRITIS PAIN PO) Take 1,000 mg by mouth daily.   Yes [provider]  albuterol (VENTOLIN HFA) 108 (90 Base) MCG/ACT inhaler Inhale 1 puff into the lungs every 6 (six) hours as needed for wheezing or shortness of breath. 11/04/19  Yes Iloabachie, Chioma E, NP  amLODipine (NORVASC) 5 MG tablet Take 1 tablet (5 mg total) by mouth daily. 11/04/19  Yes Iloabachie, Chioma E, NP  blood glucose meter kit and supplies KIT Dispense based on patient and  insurance preference. Use up to four times daily as directed. (FOR ICD-9 250.00, 250.01). 10/15/19  Yes Iloabachie, Chioma E, NP  calcium carbonate (TUMS - DOSED IN MG ELEMENTAL CALCIUM) 500 MG chewable tablet Chew by mouth.   Yes [provider]  citalopram (CELEXA) 20 MG tablet Take 20 mg by mouth daily.   Yes [provider]  cloNIDine (CATAPRES) 0.1 MG tablet Take 0.1 mg by mouth at bedtime.   Yes [provider]  esomeprazole (NEXIUM) 20 MG capsule Take 1 capsule (20 mg total) by mouth daily at 12 noon. 11/04/19  Yes Iloabachie, Chioma E, NP  Fluticasone-Salmeterol (ADVAIR) 250-50 MCG/DOSE AEPB Inhale 1 puff into the lungs 2 (two) times daily. 11/04/19  Yes Iloabachie, Chioma E, NP  gabapentin (NEURONTIN) 300 MG capsule Take 1 capsule (300 mg total) by mouth daily. 11/04/19  Yes Iloabachie, Chioma E, NP  ibuprofen (ADVIL) 200 MG tablet Take 1,000 mg by mouth daily.   Yes [provider]  Magnesium 400 MG CAPS Take 400 mg by mouth daily.   Yes [provider]  metFORMIN (GLUCOPHAGE) 500 MG tablet Take 2 tablets (1,000 mg total) by mouth 2 (two) times daily with a meal. 11/04/19 11/03/20 Yes  Iloabachie, Chioma E, NP    History reviewed. No pertinent family history.   Social History   Tobacco Use  . Smoking status: Current Every Day Smoker    Packs/day: 1.25    Years: 41.00    Pack years: 51.25    Types: Cigarettes  . Smokeless tobacco: Never Used  Vaping Use  . Vaping Use: Never used  Substance Use Topics  . Alcohol use: Not Currently  . Drug use: Not Currently    Allergies as of 12/15/2019 - Review Complete 12/15/2019  Allergen Reaction Noted  . Bee venom Anaphylaxis 06/17/2019  . Coffee flavor Shortness Of Breath 06/17/2019  . Cheese Swelling 06/17/2019  . Penicillins Other (See Comments) 06/17/2019    Review of Systems:    All systems reviewed and negative except where noted in HPI.   Physical Exam:  BP 112/74   Pulse 76   Ht 5'  10" (1.778 m)   Wt 187 lb 9.6 oz (85.1 kg)   BMI 26.92 kg/m  No LMP for male patient. General:   Alert,  Well-developed, well-nourished, pleasant and cooperative in NAD Head:  Normocephalic and atraumatic. Eyes:  Sclera clear, no icterus.   Conjunctiva pink. Ears:  Normal auditory acuity. Neck:  Supple; no masses or thyromegaly. Lungs:  Respirations even and unlabored.  Clear throughout to auscultation.   No wheezes, crackles, or rhonchi. No acute distress. Heart:  Regular rate and rhythm; no murmurs, clicks, rubs, or gallops. Abdomen:  Normal bowel sounds.  No bruits.  Soft, non-tender and non-distended without masses, hepatosplenomegaly or hernias noted.  No guarding or rebound tenderness.  Negative Carnett sign.   Rectal:  Deferred.  Pulses:  Normal pulses noted. Extremities:  No clubbing or edema.  No cyanosis. Neurologic:  Alert and oriented x3;  grossly normal neurologically. Skin:  Intact without significant lesions or rashes.  No jaundice. Lymph Nodes:  No significant cervical adenopathy. Psych:  Alert and cooperative. Normal mood and affect.  Imaging Studies: No results found.  Assessment and Plan:   Timothy Brancato. is a 59 y.o. y/o male who comes in today with a history of colon polyps and will be set up for repeat colonoscopy for surveillance.  The patient also has been told that he did and then he was told he did not have hepatitis C.  The patient will have his labs checked for a hepatitis C antibody and for a hepatitis C viral load.  The patient has been explained the plan and agrees with it.    [name], MD. Marval Regal    Note: This dictation was prepared with Dragon dictation along with smaller phrase technology. Any transcriptional errors that result from this process are unintentional.

## 2019-12-16 ENCOUNTER — Other Ambulatory Visit: Payer: Self-pay

## 2019-12-16 DIAGNOSIS — Z86010 Personal history of colonic polyps: Secondary | ICD-10-CM

## 2019-12-16 LAB — HEPATITIS C ANTIBODY: Hep C Virus Ab: <0.1 s/co ratio (ref 0.0–0.9)

## 2019-12-16 LAB — HCV RNA QUANT: Hepatitis C Quantitation: NOT DETECTED IU/mL

## 2019-12-18 ENCOUNTER — Other Ambulatory Visit
Admission: RE | Admit: 2019-12-18 | Discharge: 2019-12-18 | Disposition: A | Discharge status: Home / Self Care | Payer: HRSA Program | Source: Ambulatory Visit | Attending: Gastroenterology | Admitting: Gastroenterology

## 2019-12-18 ENCOUNTER — Telehealth: Payer: Self-pay

## 2019-12-18 ENCOUNTER — Other Ambulatory Visit: Payer: Self-pay

## 2019-12-18 DIAGNOSIS — Z20822 Contact with and (suspected) exposure to covid-19: Secondary | ICD-10-CM | POA: Insufficient documentation

## 2019-12-18 DIAGNOSIS — Z01812 Encounter for preprocedural laboratory examination: Secondary | ICD-10-CM | POA: Diagnosis present

## 2019-12-18 LAB — SARS CORONAVIRUS 2 (TAT 6-24 HRS): SARS Coronavirus 2: NEGATIVE

## 2019-12-18 NOTE — Telephone Encounter (Signed)
-----   Message from Midge Minium, MD sent at 12/17/2019  7:24 AM EDT ----- Let the patient know that all of his test for hepatitis C were negative and he does not have hepatitis C.

## 2019-12-18 NOTE — Telephone Encounter (Signed)
Pt notified of lab results

## 2019-12-22 ENCOUNTER — Ambulatory Visit
Admission: RE | Admit: 2019-12-22 | Discharge: 2019-12-22 | Disposition: A | Discharge status: Home / Self Care | Payer: Self-pay | Attending: Gastroenterology | Admitting: Gastroenterology

## 2019-12-22 ENCOUNTER — Ambulatory Visit: Payer: Self-pay | Admitting: Anesthesiology

## 2019-12-22 ENCOUNTER — Encounter
Admission: RE | Disposition: A | Discharge status: Home / Self Care | Payer: Self-pay | Source: Home / Self Care | Attending: Gastroenterology

## 2019-12-22 ENCOUNTER — Encounter: Payer: Self-pay | Admitting: Gastroenterology

## 2019-12-22 ENCOUNTER — Other Ambulatory Visit: Payer: Self-pay

## 2019-12-22 DIAGNOSIS — K5730 Diverticulosis of large intestine without perforation or abscess without bleeding: Secondary | ICD-10-CM | POA: Insufficient documentation

## 2019-12-22 DIAGNOSIS — Z7984 Long term (current) use of oral hypoglycemic drugs: Secondary | ICD-10-CM | POA: Insufficient documentation

## 2019-12-22 DIAGNOSIS — F17210 Nicotine dependence, cigarettes, uncomplicated: Secondary | ICD-10-CM | POA: Insufficient documentation

## 2019-12-22 DIAGNOSIS — Z860100 Personal history of colon polyps, unspecified: Secondary | ICD-10-CM

## 2019-12-22 DIAGNOSIS — Z79899 Other long term (current) drug therapy: Secondary | ICD-10-CM | POA: Insufficient documentation

## 2019-12-22 DIAGNOSIS — E119 Type 2 diabetes mellitus without complications: Secondary | ICD-10-CM | POA: Insufficient documentation

## 2019-12-22 DIAGNOSIS — Z1211 Encounter for screening for malignant neoplasm of colon: Secondary | ICD-10-CM | POA: Insufficient documentation

## 2019-12-22 DIAGNOSIS — J449 Chronic obstructive pulmonary disease, unspecified: Secondary | ICD-10-CM | POA: Insufficient documentation

## 2019-12-22 DIAGNOSIS — K640 First degree hemorrhoids: Secondary | ICD-10-CM | POA: Insufficient documentation

## 2019-12-22 DIAGNOSIS — Z8719 Personal history of other diseases of the digestive system: Secondary | ICD-10-CM | POA: Insufficient documentation

## 2019-12-22 DIAGNOSIS — E7800 Pure hypercholesterolemia, unspecified: Secondary | ICD-10-CM | POA: Insufficient documentation

## 2019-12-22 DIAGNOSIS — Z7951 Long term (current) use of inhaled steroids: Secondary | ICD-10-CM | POA: Insufficient documentation

## 2019-12-22 DIAGNOSIS — I10 Essential (primary) hypertension: Secondary | ICD-10-CM | POA: Insufficient documentation

## 2019-12-22 HISTORY — PX: COLONOSCOPY WITH PROPOFOL: SHX5780

## 2019-12-22 LAB — GLUCOSE, CAPILLARY: Glucose-Capillary: 254 mg/dL — ABNORMAL HIGH (ref 70–99)

## 2019-12-22 SURGERY — COLONOSCOPY WITH PROPOFOL
Anesthesia: General

## 2019-12-22 MED ORDER — PROPOFOL 500 MG/50ML IV EMUL
INTRAVENOUS | Status: AC
  Filled 2019-12-22: qty 50

## 2019-12-22 MED ORDER — PROPOFOL 10 MG/ML IV BOLUS
INTRAVENOUS | Status: DC | PRN
  Administered 2019-12-22 (×2): 50 mg via INTRAVENOUS
  Administered 2019-12-22: 100 mg via INTRAVENOUS
  Administered 2019-12-22: 50 mg via INTRAVENOUS

## 2019-12-22 MED ORDER — PROPOFOL 10 MG/ML IV BOLUS
INTRAVENOUS | Status: AC
  Filled 2019-12-22: qty 20

## 2019-12-22 MED ORDER — SODIUM CHLORIDE 0.9 % IV SOLN: INTRAVENOUS | Status: DC

## 2019-12-22 NOTE — Transfer of Care (Signed)
Immediate Anesthesia Transfer of Care Note  Patient: Timothy [name].  Procedure(s) Performed: COLONOSCOPY WITH PROPOFOL (N/A )  Patient Location: PACU and Endoscopy Unit  Anesthesia Type:General  Level of Consciousness: awake, alert  and oriented  Airway & Oxygen Therapy: Patient Spontanous Breathing  Post-op Assessment: Report given to RN and Post -op Vital signs reviewed and stable  Post vital signs: Reviewed and stable  Last Vitals:  Vitals Value Taken Time  BP 100/70 12/22/19 0817  Temp    Pulse 72 12/22/19 0818  Resp 19 12/22/19 0818  SpO2 99 % 12/22/19 0818  Vitals shown include unvalidated device data.  Last Pain:  Vitals:   12/22/19 0729  TempSrc: Temporal  PainSc: 0-No pain         Complications: No complications documented.

## 2019-12-22 NOTE — Interval H&P Note (Signed)
History and Physical Interval Note:  12/22/2019 7:38 AM  Timothy [name].  has presented today for surgery, with the diagnosis of Hx of colon polyps Z86.010.  The various methods of treatment have been discussed with the patient and family. After consideration of risks, benefits and other options for treatment, the patient has consented to  Procedure(s): COLONOSCOPY WITH PROPOFOL (N/A) as a surgical intervention.  The patient's history has been reviewed, patient examined, no change in status, stable for surgery.  I have reviewed the patient's chart and labs.  Questions were answered to the patient's satisfaction.     Dayonna Selbe [employer]

## 2019-12-22 NOTE — Op Note (Signed)
[facility] Patient Name: Timothy [name] Procedure Date: 12/22/2019 7:58 AM MRN: [id number] Account #: [id number] Date of Birth: 1960/10/24 Admit Type: Outpatient Age: 59 Room: [facility] ENDO ROOM 4 Gender: Male Note Status: Finalized Procedure:             Colonoscopy Indications:           High risk colon cancer surveillance: Personal history                         of colonic polyps 5 yeaars ago Providers:             Midge Minium MD, MD Medicines:             Propofol per Anesthesia Complications:         No immediate complications. Procedure:             Pre-Anesthesia Assessment:                        - Prior to the procedure, a History and Physical was                         performed, and patient medications and allergies were                         reviewed. The patient's tolerance of previous                         anesthesia was also reviewed. The risks and benefits                         of the procedure and the sedation options and risks                         were discussed with the patient. All questions were                         answered, and informed consent was obtained. Prior                         Anticoagulants: The patient has taken no previous                         anticoagulant or antiplatelet agents. ASA Grade                         Assessment: II - A patient with mild systemic disease.                         After reviewing the risks and benefits, the patient                         was deemed in satisfactory condition to undergo the                         procedure.                        After obtaining informed consent, the colonoscope was  passed under direct vision. Throughout the procedure,                         the patient's blood pressure, pulse, and oxygen                         saturations were monitored continuously. The                         Colonoscope was introduced  through the anus and                         advanced to the the cecum, identified by appendiceal                         orifice and ileocecal valve. The colonoscopy was                         performed without difficulty. The patient tolerated                         the procedure well. The quality of the bowel                         preparation was good. Findings:      The perianal and digital rectal examinations were normal.      A few small-mouthed diverticula were found in the entire colon.      Non-bleeding internal hemorrhoids were found during retroflexion. The       hemorrhoids were Grade I (internal hemorrhoids that do not prolapse). Impression:            - Diverticulosis in the entire examined colon.                        - Non-bleeding internal hemorrhoids.                        - No specimens collected. Recommendation:        - Discharge patient to home.                        - Resume previous diet.                        - Continue present medications.                        - Repeat colonoscopy in 7 years for surveillance. Procedure Code(s):     --- Professional ---                        [phone], Colonoscopy, flexible; diagnostic, including                         collection of specimen(s) by brushing or washing, when                         performed (separate procedure) Diagnosis Code(s):     --- Professional ---  Z86.010, Personal history of colonic polyps CPT copyright 2019 [employer]. All rights reserved. The codes documented in this report are preliminary and upon coder review may  be revised to meet current compliance requirements. Midge Minium MD, MD 12/22/2019 8:15:40 AM This report has been signed electronically. Number of Addenda: 0 Note Initiated On: 12/22/2019 7:58 AM Scope Withdrawal Time: 0 hours 6 minutes 44 seconds  Total Procedure Duration: 0 hours 10 minutes 52 seconds       [facility]

## 2019-12-22 NOTE — Anesthesia Postprocedure Evaluation (Signed)
Anesthesia Post Note  Patient: Timothy [name].  Procedure(s) Performed: COLONOSCOPY WITH PROPOFOL (N/A )  Patient location during evaluation: Endoscopy Anesthesia Type: General Level of consciousness: awake and alert Pain management: pain level controlled Vital Signs Assessment: post-procedure vital signs reviewed and stable Respiratory status: spontaneous breathing, nonlabored ventilation, respiratory function stable and patient connected to nasal cannula oxygen Cardiovascular status: blood pressure returned to baseline and stable Postop Assessment: no apparent nausea or vomiting Anesthetic complications: no   No complications documented.   Last Vitals:  Vitals:   12/22/19 0817 12/22/19 0827  BP: 100/70 126/78  Pulse: 75 74  Resp: 19 (!) 22  Temp: (!) 36.4 C   SpO2: 100% 98%    Last Pain:  Vitals:   12/22/19 0827  TempSrc:   PainSc: 0-No pain                 Lenard Simmer

## 2019-12-22 NOTE — Anesthesia Preprocedure Evaluation (Signed)
Anesthesia Evaluation  Patient identified by MRN, date of birth, ID band Patient awake    Reviewed: Allergy & Precautions, H&P , NPO status , Patient's Chart, lab work & pertinent test results, reviewed documented beta blocker date and time   History of Anesthesia Complications Negative for: history of anesthetic complications  Airway Mallampati: II  TM Distance: >3 FB Neck ROM: full    Dental  (+) Dental Advidsory Given, Missing, Poor Dentition   Pulmonary neg shortness of breath, COPD (mild), neg recent URI, Current Smoker,    Pulmonary exam normal breath sounds clear to auscultation       Cardiovascular Exercise Tolerance: Good hypertension, (-) angina(-) Past MI and (-) Cardiac Stents Normal cardiovascular exam(-) dysrhythmias (-) Valvular Problems/Murmurs Rhythm:regular Rate:Normal     Neuro/Psych PSYCHIATRIC DISORDERS Anxiety negative neurological ROS     GI/Hepatic Neg liver ROS, GERD  ,  Endo/Other  diabetes  Renal/GU negative Renal ROS  negative genitourinary   Musculoskeletal   Abdominal   Peds  Hematology negative hematology ROS (+)   Anesthesia Other Findings Past Medical History: No date: Diabetes mellitus without complication (HCC) No date: Hypercholesteremia No date: Hypertension   Reproductive/Obstetrics negative OB ROS                             Anesthesia Physical Anesthesia Plan  ASA: II  Anesthesia Plan: General   Post-op Pain Management:    Induction: Intravenous  PONV Risk Score and Plan: 1 and Propofol infusion and TIVA  Airway Management Planned: Natural Airway and Nasal Cannula  Additional Equipment:   Intra-op Plan:   Post-operative Plan:   Informed Consent: I have reviewed the patients History and Physical, chart, labs and discussed the procedure including the risks, benefits and alternatives for the proposed anesthesia with the patient or  authorized representative who has indicated his/her understanding and acceptance.     Dental Advisory Given  Plan Discussed with: Anesthesiologist, CRNA and Surgeon  Anesthesia Plan Comments:         Anesthesia Quick Evaluation

## 2019-12-23 ENCOUNTER — Encounter: Payer: Self-pay | Admitting: Gastroenterology

## 2020-01-13 ENCOUNTER — Other Ambulatory Visit: Payer: Self-pay

## 2020-01-13 DIAGNOSIS — E1140 Type 2 diabetes mellitus with diabetic neuropathy, unspecified: Secondary | ICD-10-CM

## 2020-01-14 LAB — URINALYSIS
Bilirubin, UA: NEGATIVE
Leukocytes,UA: NEGATIVE
Nitrite, UA: NEGATIVE
RBC, UA: NEGATIVE
Specific Gravity, UA: >=1.03 — AB (ref 1.005–1.030)
Urobilinogen, Ur: 0.2 mg/dL (ref 0.2–1.0)
pH, UA: 5 (ref 5.0–7.5)

## 2020-01-14 LAB — HEMOGLOBIN A1C
Est. average glucose Bld gHb Est-mCnc: 220 mg/dL
Hgb A1c MFr Bld: 9.3 % — ABNORMAL HIGH (ref 4.8–5.6)

## 2020-01-20 ENCOUNTER — Ambulatory Visit: Payer: Self-pay | Admitting: Gerontology

## 2020-01-20 ENCOUNTER — Encounter: Payer: Self-pay | Admitting: Gerontology

## 2020-01-20 ENCOUNTER — Other Ambulatory Visit: Payer: Self-pay

## 2020-01-20 VITALS — BP 127/83 | HR 89 | Ht 70.0 in | Wt 195.0 lb

## 2020-01-20 DIAGNOSIS — G6289 Other specified polyneuropathies: Secondary | ICD-10-CM

## 2020-01-20 DIAGNOSIS — E1140 Type 2 diabetes mellitus with diabetic neuropathy, unspecified: Secondary | ICD-10-CM

## 2020-01-20 DIAGNOSIS — E785 Hyperlipidemia, unspecified: Secondary | ICD-10-CM

## 2020-01-20 DIAGNOSIS — S91309A Unspecified open wound, unspecified foot, initial encounter: Secondary | ICD-10-CM | POA: Insufficient documentation

## 2020-01-20 DIAGNOSIS — S91301A Unspecified open wound, right foot, initial encounter: Secondary | ICD-10-CM

## 2020-01-20 DIAGNOSIS — F4310 Post-traumatic stress disorder, unspecified: Secondary | ICD-10-CM

## 2020-01-20 DIAGNOSIS — R0602 Shortness of breath: Secondary | ICD-10-CM

## 2020-01-20 MED ORDER — GLIPIZIDE 5 MG PO TABS: 2.5000 mg | ORAL_TABLET | Freq: Two times a day (BID) | ORAL | 0 refills | Status: DC

## 2020-01-20 MED ORDER — GABAPENTIN 300 MG PO CAPS: 300.0000 mg | ORAL_CAPSULE | Freq: Two times a day (BID) | ORAL | 2 refills | Status: DC

## 2020-01-20 MED ORDER — ROSUVASTATIN CALCIUM 5 MG PO TABS: 5.0000 mg | ORAL_TABLET | Freq: Every day | ORAL | 0 refills | Status: DC

## 2020-01-20 MED ORDER — CITALOPRAM HYDROBROMIDE 20 MG PO TABS: 20.0000 mg | ORAL_TABLET | Freq: Every day | ORAL | 0 refills | Status: DC

## 2020-01-20 MED ORDER — METFORMIN HCL 1000 MG PO TABS: 1000.0000 mg | ORAL_TABLET | Freq: Two times a day (BID) | ORAL | 2 refills | Status: DC

## 2020-01-20 MED ORDER — CLONIDINE HCL 0.1 MG PO TABS: 0.1000 mg | ORAL_TABLET | Freq: Every day | ORAL | 0 refills | Status: DC

## 2020-01-20 MED ORDER — FLUTICASONE-SALMETEROL 250-50 MCG/DOSE IN AEPB: 1.0000 | INHALATION_SPRAY | Freq: Two times a day (BID) | RESPIRATORY_TRACT | 1 refills | Status: DC

## 2020-01-20 NOTE — Patient Instructions (Signed)
Fat and Cholesterol Restricted Eating Plan Getting too much fat and cholesterol in your diet may cause health problems. Choosing the right foods helps keep your fat and cholesterol at normal levels. This can keep you from getting certain diseases. Your doctor may recommend an eating plan that includes:  Total fat: ______% or less of total calories a day.  Saturated fat: ______% or less of total calories a day.  Cholesterol: less than _________mg a day.  Fiber: ______g a day. What are tips for following this plan? Meal planning  At meals, divide your plate into four equal parts: ? Fill one-half of your plate with vegetables and green salads. ? Fill one-fourth of your plate with whole grains. ? Fill one-fourth of your plate with low-fat (lean) protein foods.  Eat fish that is high in omega-3 fats at least two times a week. This includes mackerel, tuna, sardines, and salmon.  Eat foods that are high in fiber, such as whole grains, beans, apples, broccoli, carrots, peas, and barley. General tips   Work with your doctor to lose weight if you need to.  Avoid: ? Foods with added sugar. ? Fried foods. ? Foods with partially hydrogenated oils.  Limit alcohol intake to no more than 1 drink a day for nonpregnant women and 2 drinks a day for men. One drink equals 12 oz of beer, 5 oz of wine, or 1 oz of hard liquor. Reading food labels  Check food labels for: ? Trans fats. ? Partially hydrogenated oils. ? Saturated fat (g) in each serving. ? Cholesterol (mg) in each serving. ? Fiber (g) in each serving.  Choose foods with healthy fats, such as: ? Monounsaturated fats. ? Polyunsaturated fats. ? Omega-3 fats.  Choose grain products that have whole grains. Look for the word "whole" as the first word in the ingredient list. Cooking  Cook foods using low-fat methods. These include baking, boiling, grilling, and broiling.  Eat more home-cooked foods. Eat at restaurants and buffets  less often.  Avoid cooking using saturated fats, such as butter, cream, palm oil, palm kernel oil, and coconut oil. Recommended foods  Fruits  All fresh, canned (in natural juice), or frozen fruits. Vegetables  Fresh or frozen vegetables (raw, steamed, roasted, or grilled). Green salads. Grains  Whole grains, such as whole wheat or whole grain breads, crackers, cereals, and pasta. Unsweetened oatmeal, bulgur, barley, quinoa, or brown rice. Corn or whole wheat flour tortillas. Meats and other protein foods  Ground beef (85% or leaner), grass-fed beef, or beef trimmed of fat. Skinless chicken or turkey. Ground chicken or turkey. Pork trimmed of fat. All fish and seafood. Egg whites. Dried beans, peas, or lentils. Unsalted nuts or seeds. Unsalted canned beans. Nut butters without added sugar or oil. Dairy  Low-fat or nonfat dairy products, such as skim or 1% milk, 2% or reduced-fat cheeses, low-fat and fat-free ricotta or cottage cheese, or plain low-fat and nonfat yogurt. Fats and oils  Tub margarine without trans fats. Light or reduced-fat mayonnaise and salad dressings. Avocado. Olive, canola, sesame, or safflower oils. The items listed above may not be a complete list of foods and beverages you can eat. Contact a dietitian for more information. Foods to avoid Fruits  Canned fruit in heavy syrup. Fruit in cream or butter sauce. Fried fruit. Vegetables  Vegetables cooked in cheese, cream, or butter sauce. Fried vegetables. Grains  White bread. White pasta. White rice. Cornbread. Bagels, pastries, and croissants. Crackers and snack foods that contain trans fat   and hydrogenated oils. Meats and other protein foods  Fatty cuts of meat. Ribs, chicken wings, bacon, sausage, bologna, salami, chitterlings, fatback, hot dogs, bratwurst, and packaged lunch meats. Liver and organ meats. Whole eggs and egg yolks. Chicken and turkey with skin. Fried meat. Dairy  Whole or 2% milk, cream,  half-and-half, and cream cheese. Whole milk cheeses. Whole-fat or sweetened yogurt. Full-fat cheeses. Nondairy creamers and whipped toppings. Processed cheese, cheese spreads, and cheese curds. Beverages  Alcohol. Sugar-sweetened drinks such as sodas, lemonade, and fruit drinks. Fats and oils  Butter, stick margarine, lard, shortening, ghee, or bacon fat. Coconut, palm kernel, and palm oils. Sweets and desserts  Corn syrup, sugars, honey, and molasses. Candy. Jam and jelly. Syrup. Sweetened cereals. Cookies, pies, cakes, donuts, muffins, and ice cream. The items listed above may not be a complete list of foods and beverages you should avoid. Contact a dietitian for more information. Summary  Choosing the right foods helps keep your fat and cholesterol at normal levels. This can keep you from getting certain diseases.  At meals, fill one-half of your plate with vegetables and green salads.  Eat high-fiber foods, like whole grains, beans, apples, carrots, peas, and barley.  Limit added sugar, saturated fats, alcohol, and fried foods. This information is not intended to replace advice given to you by your health care provider. Make sure you discuss any questions you have with your health care provider. Document Revised: 02/05/2018 Document Reviewed: 02/19/2017 Elsevier Patient Education  2020 [employer] Carbohydrate Counting for Diabetes Mellitus, Adult  Carbohydrate counting is a method of keeping track of how many carbohydrates you eat. Eating carbohydrates naturally increases the amount of sugar (glucose) in the blood. Counting how many carbohydrates you eat helps keep your blood glucose within normal limits, which helps you manage your diabetes (diabetes mellitus). It is important to know how many carbohydrates you can safely have in each meal. This is different for every person. A diet and nutrition specialist (registered dietitian) can help you make a meal plan and calculate how many  carbohydrates you should have at each meal and snack. Carbohydrates are found in the following foods:  Grains, such as breads and cereals.  Dried beans and soy products.  Starchy vegetables, such as potatoes, peas, and corn.  Fruit and fruit juices.  Milk and yogurt.  Sweets and snack foods, such as cake, cookies, candy, chips, and soft drinks. How do I count carbohydrates? There are two ways to count carbohydrates in food. You can use either of the methods or a combination of both. Reading "Nutrition Facts" on packaged food The "Nutrition Facts" list is included on the labels of almost all packaged foods and beverages in the [place] It includes:  The serving size.  Information about nutrients in each serving, including the grams (g) of carbohydrate per serving. To use the "Nutrition Facts":  Decide how many servings you will have.  Multiply the number of servings by the number of carbohydrates per serving.  The resulting number is the total amount of carbohydrates that you will be having. Learning standard serving sizes of other foods When you eat carbohydrate foods that are not packaged or do not include "Nutrition Facts" on the label, you need to measure the servings in order to count the amount of carbohydrates:  Measure the foods that you will eat with a food scale or measuring cup, if needed.  Decide how many standard-size servings you will eat.  Multiply the number of servings by 15.   Most carbohydrate-rich foods have about 15 g of carbohydrates per serving. ? For example, if you eat 8 oz (170 g) of strawberries, you will have eaten 2 servings and 30 g of carbohydrates (2 servings x 15 g = 30 g).  For foods that have more than one food mixed, such as soups and casseroles, you must count the carbohydrates in each food that is included. The following list contains standard serving sizes of common carbohydrate-rich foods. Each of these servings has about 15 g of  carbohydrates:   hamburger bun or  English muffin.   oz (15 mL) syrup.   oz (14 g) jelly.  1 slice of bread.  1 six-inch tortilla.  3 oz (85 g) cooked rice or pasta.  4 oz (113 g) cooked dried beans.  4 oz (113 g) starchy vegetable, such as peas, corn, or potatoes.  4 oz (113 g) hot cereal.  4 oz (113 g) mashed potatoes or  of a large baked potato.  4 oz (113 g) canned or frozen fruit.  4 oz (120 mL) fruit juice.  4-6 crackers.  6 chicken nuggets.  6 oz (170 g) unsweetened dry cereal.  6 oz (170 g) plain fat-free yogurt or yogurt sweetened with artificial sweeteners.  8 oz (240 mL) milk.  8 oz (170 g) fresh fruit or one small piece of fruit.  24 oz (680 g) popped popcorn. Example of carbohydrate counting Sample meal  3 oz (85 g) chicken breast.  6 oz (170 g) brown rice.  4 oz (113 g) corn.  8 oz (240 mL) milk.  8 oz (170 g) strawberries with sugar-free whipped topping. Carbohydrate calculation 1. Identify the foods that contain carbohydrates: ? Rice. ? Corn. ? Milk. ? Strawberries. 2. Calculate how many servings you have of each food: ? 2 servings rice. ? 1 serving corn. ? 1 serving milk. ? 1 serving strawberries. 3. Multiply each number of servings by 15 g: ? 2 servings rice x 15 g = 30 g. ? 1 serving corn x 15 g = 15 g. ? 1 serving milk x 15 g = 15 g. ? 1 serving strawberries x 15 g = 15 g. 4. Add together all of the amounts to find the total grams of carbohydrates eaten: ? 30 g + 15 g + 15 g + 15 g = 75 g of carbohydrates total. Summary  Carbohydrate counting is a method of keeping track of how many carbohydrates you eat.  Eating carbohydrates naturally increases the amount of sugar (glucose) in the blood.  Counting how many carbohydrates you eat helps keep your blood glucose within normal limits, which helps you manage your diabetes.  A diet and nutrition specialist (registered dietitian) can help you make a meal plan and calculate  how many carbohydrates you should have at each meal and snack. This information is not intended to replace advice given to you by your health care provider. Make sure you discuss any questions you have with your health care provider. Document Revised: 12/27/2016 Document Reviewed: 11/16/2015 Elsevier Patient Education  2020 [employer]  

## 2020-01-20 NOTE — Progress Notes (Signed)
Established Patient Office Visit  Subjective:  Patient ID: Timothy Goeken., male    DOB: 1961-06-11  Age: 59 y.o. MRN: [id number]  CC:  Chief Complaint  Patient presents with  . Diabetes    HPI Timothy [name]. presents for  follow up of hypertension, Type 2 diabetes mellitus, peripheral neuropathy,  and lab review. His HgbA1c done on 01/13/2020 increased from 8.1% to 9.3%. He reports that he's compliant with his medications and continues to work on making healthy life style choices. He checks his blood glucose daily and states that his readings averages in the mid 220's. He denies hypoglycemic symptoms and states that his peripheral neuropathy is not controlled with taking 300 mg gabapentin daily. He reports that he has an ulcer to plantar section of the 2nd toe of his right foot, that started more than one month ago. He denies pain, but endorses serosanguinous exudate from the ulcer. He reports that he has a history of PTSD and will follow up at [place] on 03/08/2020. He requests for his Celexa and Clonidine to be refilled because he will be out before his September appointment. He states that his mood is good, and denies suicidal nor homicidal ideation. He had Colonoscopy done on 12/22/2019 and it showed Diverticulosis in the entire examined colon. - Non-bleeding internal hemorrhoids. - No specimens collected per Dr Allen Norris D. He also had Low Dose Lung CT, and it showed Aortic Atherosclerosis, Emphysema and 12 month annual screening was recommended. He continues to smoke 1 pack of cigarette daily and admits the desire to quit. His Lipid panel done on  10/28/2019, LDL was 129 mg/dl, total cholesterol was 267 mg/dl and Triglyceride was 501 mg/dl. Overall, he states that he's doing well and offers no further complaint.         Past Medical History:  Diagnosis Date  . Diabetes mellitus without complication ([place])   . Hypercholesteremia   . Hypertension      Past Surgical History:  Procedure Laterality Date  . COLONOSCOPY WITH PROPOFOL N/A 12/22/2019   Procedure: COLONOSCOPY WITH PROPOFOL;  Surgeon: [name], MD;  Location: [facility] ENDOSCOPY;  Service: Endoscopy;  Laterality: N/A;  . TONSILLECTOMY    . WISDOM TOOTH EXTRACTION      No family history on file.  Social History   Socioeconomic History  . Marital status: Single    Spouse name: Not on file  . Number of children: Not on file  . Years of education: Not on file  . Highest education level: Not on file  Occupational History  . Not on file  Tobacco Use  . Smoking status: Current Every Day Smoker    Packs/day: 1.50    Years: 41.00    Pack years: 61.50    Types: Cigarettes  . Smokeless tobacco: Never Used  Vaping Use  . Vaping Use: Never used  Substance and Sexual Activity  . Alcohol use: Yes    Comment: social  . Drug use: Not Currently  . Sexual activity: Not on file  Other Topics Concern  . Not on file  Social History Narrative  . Not on file   Social Determinants of Health   Financial Resource Strain:   . Difficulty of Paying Living Expenses:   Food Insecurity:   . Worried About Charity fundraiser in the Last Year:   . Arboriculturist in the Last Year:   Transportation Needs:   . Film/video editor (Medical):   Marland Kitchen  Lack of Transportation (Non-Medical):   Physical Activity:   . Days of Exercise per Week:   . Minutes of Exercise per Session:   Stress:   . Feeling of Stress :   Social Connections:   . Frequency of Communication with Friends and Family:   . Frequency of Social Gatherings with Friends and Family:   . Attends Religious Services:   . Active Member of Clubs or Organizations:   . Attends Archivist Meetings:   Marland Kitchen Marital Status:   Intimate Partner Violence:   . Fear of Current or Ex-Partner:   . Emotionally Abused:   Marland Kitchen Physically Abused:   . Sexually Abused:     Outpatient Medications Prior to Visit  Medication Sig  Dispense Refill  . amLODipine (NORVASC) 5 MG tablet Take 1 tablet (5 mg total) by mouth daily. 30 tablet 3  . esomeprazole (NEXIUM) 20 MG capsule Take 1 capsule (20 mg total) by mouth daily at 12 noon. 30 capsule 3  . Magnesium 400 MG CAPS Take 400 mg by mouth daily.    . citalopram (CELEXA) 20 MG tablet Take 20 mg by mouth daily.    . cloNIDine (CATAPRES) 0.1 MG tablet Take 0.1 mg by mouth at bedtime.    . gabapentin (NEURONTIN) 300 MG capsule Take 1 capsule (300 mg total) by mouth daily. 30 capsule 2  . metFORMIN (GLUCOPHAGE) 500 MG tablet Take 2 tablets (1,000 mg total) by mouth 2 (two) times daily with a meal. 60 tablet 2  . Acetaminophen (ARTHRITIS PAIN PO) Take 1,000 mg by mouth daily.    Marland Kitchen albuterol (VENTOLIN HFA) 108 (90 Base) MCG/ACT inhaler Inhale 1 puff into the lungs every 6 (six) hours as needed for wheezing or shortness of breath. 18 g 2  . blood glucose meter kit and supplies KIT Dispense based on patient and insurance preference. Use up to four times daily as directed. (FOR ICD-9 250.00, 250.01). 1 each 0  . calcium carbonate (TUMS - DOSED IN MG ELEMENTAL CALCIUM) 500 MG chewable tablet Chew by mouth.    . Fluticasone-Salmeterol (ADVAIR) 250-50 MCG/DOSE AEPB Inhale 1 puff into the lungs 2 (two) times daily. 60 each 1  . ibuprofen (ADVIL) 200 MG tablet Take 1,000 mg by mouth daily.     No facility-administered medications prior to visit.    Allergies  Allergen Reactions  . Bee Venom Anaphylaxis  . Coffee Flavor Shortness Of Breath  . Cheese Swelling    Blue Cheese only  . Penicillins Other (See Comments)    unknown    ROS Review of Systems  Constitutional: Negative.   Eyes: Negative.   Respiratory: Positive for shortness of breath (Intermittent shortness of breath with exertion).   Cardiovascular: Negative.   Endocrine: Negative.   Skin: Positive for wound (open area to plantar section of 2nd toe of right foot.).  Neurological: Positive for numbness (peripheral  neuropathy).  Psychiatric/Behavioral: Negative.       Objective:    Physical Exam Eyes:     Extraocular Movements: Extraocular movements intact.     Pupils: Pupils are equal, round, and reactive to light.  Cardiovascular:     Rate and Rhythm: Normal rate and regular rhythm.     Pulses: Normal pulses.     Heart sounds: Normal heart sounds.  Pulmonary:     Effort: Pulmonary effort is normal.     Breath sounds: Normal breath sounds.  Skin:    Findings: Lesion (open ara to plantar section of  2nd toe to right foot.) present.  Neurological:     General: No focal deficit present.     Mental Status: He is alert and oriented to person, place, and time. Mental status is at baseline.  Psychiatric:        Mood and Affect: Mood normal.        Behavior: Behavior normal.        Thought Content: Thought content normal.        Judgment: Judgment normal.     BP 127/83 (BP Location: Left Arm, Patient Position: Sitting)   Pulse 89   Ht '5\' 10"'  (1.778 m)   Wt 195 lb (88.5 kg)   SpO2 95%   BMI 27.98 kg/m  Wt Readings from Last 3 Encounters:  01/20/20 195 lb (88.5 kg)  01/14/20 194 lb 14.4 oz (88.4 kg)  12/22/19 187 lb 9.8 oz (85.1 kg)     Health Maintenance Due  Topic Date Due  . OPHTHALMOLOGY EXAM  Never done  . COVID-19 Vaccine (1) Never done  . HIV Screening  Never done  . TETANUS/TDAP  Never done  . INFLUENZA VACCINE  01/17/2020    There are no preventive care reminders to display for this patient.  Lab Results  Component Value Date   TSH 1.720 10/28/2019   Lab Results  Component Value Date   WBC 8.7 10/28/2019   HGB 16.3 10/28/2019   HCT 46.8 10/28/2019   MCV 95 10/28/2019   PLT 293 10/28/2019   Lab Results  Component Value Date   NA 130 (L) 10/28/2019   K 4.9 10/28/2019   CO2 23 10/28/2019   GLUCOSE 279 (H) 10/28/2019   BUN 13 10/28/2019   CREATININE 0.90 10/28/2019   BILITOT 0.5 10/28/2019   ALKPHOS 77 10/28/2019   AST 15 10/28/2019   ALT 15 10/28/2019    PROT 7.8 10/28/2019   ALBUMIN 5.0 (H) 10/28/2019   CALCIUM 9.7 10/28/2019   ANIONGAP 8 08/16/2019   Lab Results  Component Value Date   CHOL 267 (H) 10/28/2019   Lab Results  Component Value Date   HDL 47 10/28/2019   Lab Results  Component Value Date   LDLCALC 129 (H) 10/28/2019   Lab Results  Component Value Date   TRIG 501 (H) 10/28/2019   Lab Results  Component Value Date   CHOLHDL 5.7 (H) 10/28/2019   Lab Results  Component Value Date   HGBA1C 9.3 (H) 01/13/2020      Assessment & Plan:   1. Type 2 diabetes mellitus with diabetic neuropathy, without long-term current use of insulin (HCC) - His HgbA1c was 9.3% and his goal should be less than 7%. He was advised to check his blood glucose bid, his fasting reading should be between 80-130 mg/dl. He's to record and bring log to follow up appointment. He was advised to continue on low carb/non concentrated sweet diet and exercise as tolerated. He will start on Glipizide 2.5 mg bid, was educated about medication side effects and was advised to notify clinic. - metFORMIN (GLUCOPHAGE) 1000 MG tablet; Take 1 tablet (1,000 mg total) by mouth 2 (two) times daily with a meal.  Dispense: 60 tablet; Refill: 2 - gabapentin (NEURONTIN) 300 MG capsule; Take 1 capsule (300 mg total) by mouth 2 (two) times daily.  Dispense: 60 capsule; Refill: 2 - glipiZIDE (GLUCOTROL) 5 MG tablet; Take 0.5 tablets (2.5 mg total) by mouth 2 (two) times daily before a meal.  Dispense: 60 tablet; Refill: 0  2. Other polyneuropathy - His neuropathy is not under control and gabapentin was increased to 300 mg bid. His Monofilament test was abnormal and he will be referred to Podiatry. - gabapentin (NEURONTIN) 300 MG capsule; Take 1 capsule (300 mg total) by mouth 2 (two) times daily.  Dispense: 60 capsule; Refill: 2  3. Shortness of breath - He will continue on current medication, was strongly encouraged on smoking cessation. - Fluticasone-Salmeterol  (ADVAIR) 250-50 MCG/DOSE AEPB; Inhale 1 puff into the lungs 2 (two) times daily.  Dispense: 60 each; Refill: 1  4. Post-traumatic stress disorder, unspecified - He=is Celexa and Clonidine were refilled, he was advised to follow up with [facility] on 03/08/2020. He was advised to call crisis help line or go to the ED for worsening symptoms. - citalopram (CELEXA) 20 MG tablet; Take 1 tablet (20 mg total) by mouth daily.  Dispense: 56 tablet; Refill: 0 - cloNIDine (CATAPRES) 0.1 MG tablet; Take 1 tablet (0.1 mg total) by mouth at bedtime.  Dispense: 56 tablet; Refill: 0  5. Open wound of right foot, initial encounter - He was advised to perform daily foot checks and - Ambulatory referral to Podiatry  6. Elevated lipids -The 10-year ASCVD risk score Mikey Bussing DC Jr., et al., 2013) is: 32.8%   Values used to calculate the score:     Age: 60 years     Sex: Male     Is Non-Hispanic African American: No     Diabetic: Yes     Tobacco smoker: Yes     Systolic Blood Pressure: 756 mmHg     Is BP treated: Yes     HDL Cholesterol: 47 mg/dL     Total Cholesterol: 267 mg/dL His risk was 32.8%, he will continue on Rosuvastatin 5 mg, was educated on medication side effects and advised to notify clinic. He is to continue on low fat/cholesterol diet and exercise as tolerated. - rosuvastatin (CRESTOR) 5 MG tablet; Take 1 tablet (5 mg total) by mouth daily.  Dispense: 30 tablet; Refill: 0      Follow-up: Return in about 29 days (around 02/18/2020), or if symptoms worsen or fail to improve.    Ellory Khurana Jerold Coombe, NP

## 2020-02-10 ENCOUNTER — Other Ambulatory Visit: Payer: Self-pay

## 2020-02-10 ENCOUNTER — Ambulatory Visit (INDEPENDENT_AMBULATORY_CARE_PROVIDER_SITE_OTHER): Payer: No Payment, Other | Admitting: Licensed Clinical Social Worker

## 2020-02-10 ENCOUNTER — Encounter (HOSPITAL_COMMUNITY): Payer: Self-pay | Admitting: Licensed Clinical Social Worker

## 2020-02-10 DIAGNOSIS — F4310 Post-traumatic stress disorder, unspecified: Secondary | ICD-10-CM | POA: Diagnosis not present

## 2020-02-10 DIAGNOSIS — F411 Generalized anxiety disorder: Secondary | ICD-10-CM

## 2020-02-10 DIAGNOSIS — F315 Bipolar disorder, current episode depressed, severe, with psychotic features: Secondary | ICD-10-CM | POA: Diagnosis not present
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
## 2020-02-10 NOTE — Patient Instructions (Signed)
Suicidal Feelings: How to Help Yourself Suicide is when you end your own life. There are many things you can do to help yourself feel better when struggling with these feelings. Many services and people are available to support you and others who struggle with similar feelings.  If you ever feel like you may hurt yourself or others, or have thoughts about taking your own life, get help right away. To get help:  Call your local emergency services (911 in the [place]  The [employer]'s health and human services helpline (211 in the [place]  Go to your nearest emergency department.  Call a suicide hotline to speak with a trained counselor. The following suicide hotlines are available in the [place]: ? 1-800-273-TALK (1-800-273-8255). ? 1-800-SUICIDE (1-800-784-2433). ? 1-888-628-9454. This is a hotline for Spanish speakers. ? 1-800-799-4889. This is a hotline for TTY users. ? 1-866-4-U-TREVOR (1-866-488-7386). This is a hotline for lesbian, gay, bisexual, transgender, or questioning youth. ? For a list of hotlines in [place], visit www.suicide.org/hotlines/international/[place]-suicide-hotlines.html  Contact a crisis center or a local suicide prevention center. To find a crisis center or suicide prevention center: ? Call your local hospital, clinic, community service organization, mental health center, social service provider, or health department. Ask for help with connecting to a crisis center. ? For a list of crisis centers in the [place], visit: suicidepreventionlifeline.org ? For a list of crisis centers in [place], visit: suicideprevention.ca How to help yourself feel better   Promise yourself that you will not do anything extreme when you have suicidal feelings. Remember, there is hope. Many people have gotten through suicidal thoughts and feelings, and you can too. If you have had these feelings before, remind yourself that you can get through them again.  Let family, friends,  teachers, or counselors know how you are feeling. Try not to separate yourself from those who care about you and want to help you. Talk with someone every day, even if you do not feel sociable. Face-to-face conversation is best to help them understand your feelings.  Contact a mental health care provider and work with this person regularly.  Make a safety plan that you can follow during a crisis. Include phone numbers of suicide prevention hotlines, mental health professionals, and trusted friends and family members you can call during an emergency. Save these numbers on your phone.  If you are thinking of taking a lot of medicine, give your medicine to someone who can give it to you as prescribed. If you are on antidepressants and are concerned you will overdose, tell your health care provider so that he or she can give you safer medicines.  Try to stick to your routines. Follow a schedule every day. Make self-care a priority.  Make a list of realistic goals, and cross them off when you achieve them. Accomplishments can give you a sense of worth.  Wait until you are feeling better before doing things that you find difficult or unpleasant.  Do things that you have always enjoyed to take your mind off your feelings. Try reading a book, or listening to or playing music. Spending time outside, in nature, may help you feel better. Follow these instructions at home:   Visit your primary health care provider every year for a checkup.  Work with a mental health care provider as needed.  Eat a well-balanced diet, and eat regular meals.  Get plenty of rest.  Exercise if you are able. Just 30 minutes of exercise each day can   help you feel better.  Take over-the-counter and prescription medicines only as told by your health care provider. Ask your mental health care provider about the possible side effects of any medicines you are taking.  Do not use alcohol or drugs, and remove these substances  from your home.  Remove weapons, poisons, knives, and other deadly items from your home. General recommendations  Keep your living space well lit.  When you are feeling well, write yourself a letter with tips and support that you can read when you are not feeling well.  Remember that life's difficulties can be sorted out with help. Conditions can be treated, and you can learn behaviors and ways of thinking that will help you. Where to find more information  National Suicide Prevention Lifeline: www.suicidepreventionlifeline.org  Hopeline: www.hopeline.com  American Foundation for Suicide Prevention: www.afsp.org  The Trevor Project (for lesbian, gay, bisexual, transgender, or questioning youth): www.thetrevorproject.org Contact a health care provider if:  You feel as though you are a burden to others.  You feel agitated, angry, vengeful, or have extreme mood swings.  You have withdrawn from family and friends. Get help right away if:  You are talking about suicide or wishing to die.  You start making plans for how to commit suicide.  You feel that you have no reason to live.  You start making plans for putting your affairs in order, saying goodbye, or giving your possessions away.  You feel guilt, shame, or unbearable pain, and it seems like there is no way out.  You are frequently using drugs or alcohol.  You are engaging in risky behaviors that could lead to death. If you have any of these symptoms, get help right away. Call emergency services, go to your nearest emergency department or crisis center, or call a suicide crisis helpline. Summary  Suicide is when you take your own life.  Promise yourself that you will not do anything extreme when you have suicidal feelings.  Let family, friends, teachers, or counselors know how you are feeling.  Get help right away if you feel as though life is getting too tough to handle and you are thinking about suicide. This  information is not intended to replace advice given to you by your health care provider. Make sure you discuss any questions you have with your health care provider. Document Revised: 09/25/2018 Document Reviewed: 01/15/2017 Elsevier Patient Education  2020 [employer].  

## 2020-02-17 ENCOUNTER — Ambulatory Visit: Payer: Self-pay | Admitting: Gerontology

## 2020-02-17 ENCOUNTER — Encounter: Payer: Self-pay | Admitting: Gerontology

## 2020-02-17 ENCOUNTER — Other Ambulatory Visit: Payer: Self-pay

## 2020-02-17 VITALS — BP 143/83 | HR 66 | Ht 70.0 in | Wt 194.0 lb

## 2020-02-17 DIAGNOSIS — E1140 Type 2 diabetes mellitus with diabetic neuropathy, unspecified: Secondary | ICD-10-CM

## 2020-02-17 DIAGNOSIS — E785 Hyperlipidemia, unspecified: Secondary | ICD-10-CM

## 2020-02-17 MED ORDER — GLIPIZIDE 5 MG PO TABS: 2.5000 mg | ORAL_TABLET | Freq: Two times a day (BID) | ORAL | 1 refills | Status: DC

## 2020-02-17 MED ORDER — ROSUVASTATIN CALCIUM 5 MG PO TABS: 5.0000 mg | ORAL_TABLET | Freq: Every day | ORAL | 3 refills | Status: DC

## 2020-02-17 NOTE — Patient Instructions (Signed)
Fat and Cholesterol Restricted Eating Plan Getting too much fat and cholesterol in your diet may cause health problems. Choosing the right foods helps keep your fat and cholesterol at normal levels. This can keep you from getting certain diseases. Your doctor may recommend an eating plan that includes:  Total fat: ______% or less of total calories a day.  Saturated fat: ______% or less of total calories a day.  Cholesterol: less than _________mg a day.  Fiber: ______g a day. What are tips for following this plan? Meal planning  At meals, divide your plate into four equal parts: ? Fill one-half of your plate with vegetables and green salads. ? Fill one-fourth of your plate with whole grains. ? Fill one-fourth of your plate with low-fat (lean) protein foods.  Eat fish that is high in omega-3 fats at least two times a week. This includes mackerel, tuna, sardines, and salmon.  Eat foods that are high in fiber, such as whole grains, beans, apples, broccoli, carrots, peas, and barley. General tips   Work with your doctor to lose weight if you need to.  Avoid: ? Foods with added sugar. ? Fried foods. ? Foods with partially hydrogenated oils.  Limit alcohol intake to no more than 1 drink a day for nonpregnant women and 2 drinks a day for men. One drink equals 12 oz of beer, 5 oz of wine, or 1 oz of hard liquor. Reading food labels  Check food labels for: ? Trans fats. ? Partially hydrogenated oils. ? Saturated fat (g) in each serving. ? Cholesterol (mg) in each serving. ? Fiber (g) in each serving.  Choose foods with healthy fats, such as: ? Monounsaturated fats. ? Polyunsaturated fats. ? Omega-3 fats.  Choose grain products that have whole grains. Look for the word "whole" as the first word in the ingredient list. Cooking  Cook foods using low-fat methods. These include baking, boiling, grilling, and broiling.  Eat more home-cooked foods. Eat at restaurants and buffets  less often.  Avoid cooking using saturated fats, such as butter, cream, palm oil, palm kernel oil, and coconut oil. Recommended foods  Fruits  All fresh, canned (in natural juice), or frozen fruits. Vegetables  Fresh or frozen vegetables (raw, steamed, roasted, or grilled). Green salads. Grains  Whole grains, such as whole wheat or whole grain breads, crackers, cereals, and pasta. Unsweetened oatmeal, bulgur, barley, quinoa, or brown rice. Corn or whole wheat flour tortillas. Meats and other protein foods  Ground beef (85% or leaner), grass-fed beef, or beef trimmed of fat. Skinless chicken or turkey. Ground chicken or turkey. Pork trimmed of fat. All fish and seafood. Egg whites. Dried beans, peas, or lentils. Unsalted nuts or seeds. Unsalted canned beans. Nut butters without added sugar or oil. Dairy  Low-fat or nonfat dairy products, such as skim or 1% milk, 2% or reduced-fat cheeses, low-fat and fat-free ricotta or cottage cheese, or plain low-fat and nonfat yogurt. Fats and oils  Tub margarine without trans fats. Light or reduced-fat mayonnaise and salad dressings. Avocado. Olive, canola, sesame, or safflower oils. The items listed above may not be a complete list of foods and beverages you can eat. Contact a dietitian for more information. Foods to avoid Fruits  Canned fruit in heavy syrup. Fruit in cream or butter sauce. Fried fruit. Vegetables  Vegetables cooked in cheese, cream, or butter sauce. Fried vegetables. Grains  White bread. White pasta. White rice. Cornbread. Bagels, pastries, and croissants. Crackers and snack foods that contain trans fat   and hydrogenated oils. Meats and other protein foods  Fatty cuts of meat. Ribs, chicken wings, bacon, sausage, bologna, salami, chitterlings, fatback, hot dogs, bratwurst, and packaged lunch meats. Liver and organ meats. Whole eggs and egg yolks. Chicken and turkey with skin. Fried meat. Dairy  Whole or 2% milk, cream,  half-and-half, and cream cheese. Whole milk cheeses. Whole-fat or sweetened yogurt. Full-fat cheeses. Nondairy creamers and whipped toppings. Processed cheese, cheese spreads, and cheese curds. Beverages  Alcohol. Sugar-sweetened drinks such as sodas, lemonade, and fruit drinks. Fats and oils  Butter, stick margarine, lard, shortening, ghee, or bacon fat. Coconut, palm kernel, and palm oils. Sweets and desserts  Corn syrup, sugars, honey, and molasses. Candy. Jam and jelly. Syrup. Sweetened cereals. Cookies, pies, cakes, donuts, muffins, and ice cream. The items listed above may not be a complete list of foods and beverages you should avoid. Contact a dietitian for more information. Summary  Choosing the right foods helps keep your fat and cholesterol at normal levels. This can keep you from getting certain diseases.  At meals, fill one-half of your plate with vegetables and green salads.  Eat high-fiber foods, like whole grains, beans, apples, carrots, peas, and barley.  Limit added sugar, saturated fats, alcohol, and fried foods. This information is not intended to replace advice given to you by your health care provider. Make sure you discuss any questions you have with your health care provider. Document Revised: 02/05/2018 Document Reviewed: 02/19/2017 Elsevier Patient Education  2020 [employer] Carbohydrate Counting for Diabetes Mellitus, Adult  Carbohydrate counting is a method of keeping track of how many carbohydrates you eat. Eating carbohydrates naturally increases the amount of sugar (glucose) in the blood. Counting how many carbohydrates you eat helps keep your blood glucose within normal limits, which helps you manage your diabetes (diabetes mellitus). It is important to know how many carbohydrates you can safely have in each meal. This is different for every person. A diet and nutrition specialist (registered dietitian) can help you make a meal plan and calculate how many  carbohydrates you should have at each meal and snack. Carbohydrates are found in the following foods:  Grains, such as breads and cereals.  Dried beans and soy products.  Starchy vegetables, such as potatoes, peas, and corn.  Fruit and fruit juices.  Milk and yogurt.  Sweets and snack foods, such as cake, cookies, candy, chips, and soft drinks. How do I count carbohydrates? There are two ways to count carbohydrates in food. You can use either of the methods or a combination of both. Reading "Nutrition Facts" on packaged food The "Nutrition Facts" list is included on the labels of almost all packaged foods and beverages in the [place] It includes:  The serving size.  Information about nutrients in each serving, including the grams (g) of carbohydrate per serving. To use the "Nutrition Facts":  Decide how many servings you will have.  Multiply the number of servings by the number of carbohydrates per serving.  The resulting number is the total amount of carbohydrates that you will be having. Learning standard serving sizes of other foods When you eat carbohydrate foods that are not packaged or do not include "Nutrition Facts" on the label, you need to measure the servings in order to count the amount of carbohydrates:  Measure the foods that you will eat with a food scale or measuring cup, if needed.  Decide how many standard-size servings you will eat.  Multiply the number of servings by 15.   Most carbohydrate-rich foods have about 15 g of carbohydrates per serving. ? For example, if you eat 8 oz (170 g) of strawberries, you will have eaten 2 servings and 30 g of carbohydrates (2 servings x 15 g = 30 g).  For foods that have more than one food mixed, such as soups and casseroles, you must count the carbohydrates in each food that is included. The following list contains standard serving sizes of common carbohydrate-rich foods. Each of these servings has about 15 g of  carbohydrates:   hamburger bun or  English muffin.   oz (15 mL) syrup.   oz (14 g) jelly.  1 slice of bread.  1 six-inch tortilla.  3 oz (85 g) cooked rice or pasta.  4 oz (113 g) cooked dried beans.  4 oz (113 g) starchy vegetable, such as peas, corn, or potatoes.  4 oz (113 g) hot cereal.  4 oz (113 g) mashed potatoes or  of a large baked potato.  4 oz (113 g) canned or frozen fruit.  4 oz (120 mL) fruit juice.  4-6 crackers.  6 chicken nuggets.  6 oz (170 g) unsweetened dry cereal.  6 oz (170 g) plain fat-free yogurt or yogurt sweetened with artificial sweeteners.  8 oz (240 mL) milk.  8 oz (170 g) fresh fruit or one small piece of fruit.  24 oz (680 g) popped popcorn. Example of carbohydrate counting Sample meal  3 oz (85 g) chicken breast.  6 oz (170 g) brown rice.  4 oz (113 g) corn.  8 oz (240 mL) milk.  8 oz (170 g) strawberries with sugar-free whipped topping. Carbohydrate calculation 1. Identify the foods that contain carbohydrates: ? Rice. ? Corn. ? Milk. ? Strawberries. 2. Calculate how many servings you have of each food: ? 2 servings rice. ? 1 serving corn. ? 1 serving milk. ? 1 serving strawberries. 3. Multiply each number of servings by 15 g: ? 2 servings rice x 15 g = 30 g. ? 1 serving corn x 15 g = 15 g. ? 1 serving milk x 15 g = 15 g. ? 1 serving strawberries x 15 g = 15 g. 4. Add together all of the amounts to find the total grams of carbohydrates eaten: ? 30 g + 15 g + 15 g + 15 g = 75 g of carbohydrates total. Summary  Carbohydrate counting is a method of keeping track of how many carbohydrates you eat.  Eating carbohydrates naturally increases the amount of sugar (glucose) in the blood.  Counting how many carbohydrates you eat helps keep your blood glucose within normal limits, which helps you manage your diabetes.  A diet and nutrition specialist (registered dietitian) can help you make a meal plan and calculate  how many carbohydrates you should have at each meal and snack. This information is not intended to replace advice given to you by your health care provider. Make sure you discuss any questions you have with your health care provider. Document Revised: 12/27/2016 Document Reviewed: 11/16/2015 Elsevier Patient Education  2020 [employer]  

## 2020-02-17 NOTE — Progress Notes (Signed)
Established Patient Office Visit  Subjective:  Patient ID: Timothy Zou., male    DOB: [birth year]  Age: 59 y.o. MRN: [id number]  CC:  Chief Complaint  Patient presents with  . Diabetes  . Diabetic Neuropathy    HPI Timothy [name]. presents for follow up of type 2 diabetes mellitus and elevated lipids. His HgbA1c done on 01/13/2020 was 9.3%, he states that he checks his blood glucose bid and he didn't bring his log. He reports that his fasting reading was 201 mg/dl this morning. He states that he's compliant with his medications and he continues to work on his diet. He denies hypoglycemic symptoms, but occasional polyuria. He states that his peripheral neuropathy is controlled with taking 300 mg gabapentin bid, and the ulcer to plantar section of the 2nd toe of his right foot has healed, and he performs daily foot checks. He continues to take 5 mg Crestor and he denies myalgia nor muscle weakness. He states that his mood is good, denies suicidal nor homicidal ideation. Overall, he states that he's doing well and offers no further complaint.  Past Medical History:  Diagnosis Date  . Diabetes mellitus without complication ([place])   . Hypercholesteremia   . Hypertension     Past Surgical History:  Procedure Laterality Date  . COLONOSCOPY WITH PROPOFOL N/A 12/22/2019   Procedure: COLONOSCOPY WITH PROPOFOL;  Surgeon: [name], MD;  Location: [facility] ENDOSCOPY;  Service: Endoscopy;  Laterality: N/A;  . TONSILLECTOMY    . WISDOM TOOTH EXTRACTION      No family history on file.  Social History   Socioeconomic History  . Marital status: Single    Spouse name: Not on file  . Number of children: Not on file  . Years of education: Not on file  . Highest education level: Not on file  Occupational History  . Not on file  Tobacco Use  . Smoking status: Current Every Day Smoker    Packs/day: 1.50    Years: 41.00    Pack years: 61.50    Types: Cigarettes  . Smokeless tobacco:  Never Used  Vaping Use  . Vaping Use: Never used  Substance and Sexual Activity  . Alcohol use: Yes    Alcohol/week: 6.0 standard drinks    Types: 6 Cans of beer per week    Comment: occasionally   . Drug use: Not Currently  . Sexual activity: Not Currently  Other Topics Concern  . Not on file  Social History Narrative  . Not on file   Social Determinants of Health   Financial Resource Strain: Medium Risk  . Difficulty of Paying Living Expenses: Somewhat hard  Food Insecurity: No Food Insecurity  . Worried About Charity fundraiser in the Last Year: Never true  . Ran Out of Food in the Last Year: Never true  Transportation Needs: No Transportation Needs  . Lack of Transportation (Medical): No  . Lack of Transportation (Non-Medical): No  Physical Activity: Inactive  . Days of Exercise per Week: 0 days  . Minutes of Exercise per Session: 0 min  Stress: Stress Concern Present  . Feeling of Stress : Rather much  Social Connections: Unknown  . Frequency of Communication with Friends and Family: Three times a week  . Frequency of Social Gatherings with Friends and Family: Never  . Attends Religious Services: Never  . Active Member of Clubs or Organizations: No  . Attends Archivist Meetings: Never  . Marital Status:  Not on file  Intimate Partner Violence: Not At Risk  . Fear of Current or Ex-Partner: No  . Emotionally Abused: No  . Physically Abused: No  . Sexually Abused: No    Outpatient Medications Prior to Visit  Medication Sig Dispense Refill  . Acetaminophen (ARTHRITIS PAIN PO) Take 1,000 mg by mouth daily.    Marland Kitchen albuterol (VENTOLIN HFA) 108 (90 Base) MCG/ACT inhaler Inhale 1 puff into the lungs every 6 (six) hours as needed for wheezing or shortness of breath. 18 g 2  . amLODipine (NORVASC) 5 MG tablet Take 1 tablet (5 mg total) by mouth daily. 30 tablet 3  . blood glucose meter kit and supplies KIT Dispense based on patient and insurance preference. Use up  to four times daily as directed. (FOR ICD-9 250.00, 250.01). 1 each 0  . calcium carbonate (TUMS - DOSED IN MG ELEMENTAL CALCIUM) 500 MG chewable tablet Chew by mouth.    . citalopram (CELEXA) 20 MG tablet Take 1 tablet (20 mg total) by mouth daily. 56 tablet 0  . cloNIDine (CATAPRES) 0.1 MG tablet Take 1 tablet (0.1 mg total) by mouth at bedtime. 56 tablet 0  . esomeprazole (NEXIUM) 20 MG capsule Take 1 capsule (20 mg total) by mouth daily at 12 noon. 30 capsule 3  . Fluticasone-Salmeterol (ADVAIR) 250-50 MCG/DOSE AEPB Inhale 1 puff into the lungs 2 (two) times daily. 60 each 1  . gabapentin (NEURONTIN) 300 MG capsule Take 1 capsule (300 mg total) by mouth 2 (two) times daily. 60 capsule 2  . Magnesium 400 MG CAPS Take 400 mg by mouth daily.    . metFORMIN (GLUCOPHAGE) 1000 MG tablet Take 1 tablet (1,000 mg total) by mouth 2 (two) times daily with a meal. 60 tablet 2  . glipiZIDE (GLUCOTROL) 5 MG tablet Take 0.5 tablets (2.5 mg total) by mouth 2 (two) times daily before a meal. 60 tablet 0  . rosuvastatin (CRESTOR) 5 MG tablet Take 1 tablet (5 mg total) by mouth daily. 30 tablet 0   No facility-administered medications prior to visit.    Allergies  Allergen Reactions  . Bee Venom Anaphylaxis  . Coffee Flavor Shortness Of Breath  . Cheese Swelling    Blue Cheese only  . Penicillins Other (See Comments)    unknown    ROS Review of Systems  Constitutional: Negative.   Eyes: Negative.   Respiratory: Negative.   Cardiovascular: Negative.   Skin: Negative.   Psychiatric/Behavioral: Negative.       Objective:    Physical Exam HENT:     Head: Normocephalic and atraumatic.  Cardiovascular:     Rate and Rhythm: Normal rate and regular rhythm.     Pulses: Normal pulses.     Heart sounds: Normal heart sounds.  Pulmonary:     Effort: Pulmonary effort is normal.     Breath sounds: Normal breath sounds.  Skin:    General: Skin is warm and dry.  Neurological:     General: No focal  deficit present.     Mental Status: He is alert and oriented to person, place, and time.  Psychiatric:        Mood and Affect: Mood normal.        Behavior: Behavior normal.        Thought Content: Thought content normal.        Judgment: Judgment normal.     BP (!) 143/83 (BP Location: Left Arm, Patient Position: Sitting)   Pulse 66  Ht '5\' 10"'  (1.778 m)   Wt 194 lb (88 kg)   SpO2 96%   BMI 27.84 kg/m  Wt Readings from Last 3 Encounters:  02/17/20 194 lb (88 kg)  01/20/20 195 lb (88.5 kg)  01/14/20 194 lb 14.4 oz (88.4 kg)     Health Maintenance Due  Topic Date Due  . OPHTHALMOLOGY EXAM  Never done  . COVID-19 Vaccine (1) Never done  . HIV Screening  Never done  . TETANUS/TDAP  Never done  . INFLUENZA VACCINE  Never done    There are no preventive care reminders to display for this patient.  Lab Results  Component Value Date   TSH 1.720 10/28/2019   Lab Results  Component Value Date   WBC 8.7 10/28/2019   HGB 16.3 10/28/2019   HCT 46.8 10/28/2019   MCV 95 10/28/2019   PLT 293 10/28/2019   Lab Results  Component Value Date   NA 130 (L) 10/28/2019   K 4.9 10/28/2019   CO2 23 10/28/2019   GLUCOSE 279 (H) 10/28/2019   BUN 13 10/28/2019   CREATININE 0.90 10/28/2019   BILITOT 0.5 10/28/2019   ALKPHOS 77 10/28/2019   AST 15 10/28/2019   ALT 15 10/28/2019   PROT 7.8 10/28/2019   ALBUMIN 5.0 (H) 10/28/2019   CALCIUM 9.7 10/28/2019   ANIONGAP 8 08/16/2019   Lab Results  Component Value Date   CHOL 267 (H) 10/28/2019   Lab Results  Component Value Date   HDL 47 10/28/2019   Lab Results  Component Value Date   LDLCALC 129 (H) 10/28/2019   Lab Results  Component Value Date   TRIG 501 (H) 10/28/2019   Lab Results  Component Value Date   CHOLHDL 5.7 (H) 10/28/2019   Lab Results  Component Value Date   HGBA1C 9.3 (H) 01/13/2020      Assessment & Plan:    1. Type 2 diabetes mellitus with diabetic neuropathy, without long-term current use of  insulin (HCC) - His HgbA1c was 9.3%, his goal should be less than 7%. He was advised to check his blood glucose TID, record and bring log to follow up visit. He was advised to continue on low carb/ non concentrated sweet diet and exercise as tolerated. - glipiZIDE (GLUCOTROL) 5 MG tablet; Take 0.5 tablets (2.5 mg total) by mouth 2 (two) times daily before a meal.  Dispense: 60 tablet; Refill: 1 - Ambulatory referral to Ophthalmology - HgB A1c; Future  2. Elevated lipids -The 10-year ASCVD risk score Mikey Bussing DC Jr., et al., 2013) is: 38.9%   Values used to calculate the score:     Age: 5 years     Sex: Male     Is Non-Hispanic African American: No     Diabetic: Yes     Tobacco smoker: Yes     Systolic Blood Pressure: 389 mmHg     Is BP treated: Yes     HDL Cholesterol: 47 mg/dL     Total Cholesterol: 267 mg/dL He will continue on current treatment regimen and was advised to continue on low fat/ low cholesterol diet and encouraged him on smoking cessation. - rosuvastatin (CRESTOR) 5 MG tablet; Take 1 tablet (5 mg total) by mouth daily.  Dispense: 30 tablet; Refill: 3 - Lipid panel; Future    Follow-up: Return in about 9 weeks (around 04/20/2020), or if symptoms worsen or fail to improve.    Kathleena Freeman Jerold Coombe, NP

## 2020-03-04 ENCOUNTER — Other Ambulatory Visit: Payer: Self-pay | Admitting: Gerontology

## 2020-03-04 DIAGNOSIS — I10 Essential (primary) hypertension: Secondary | ICD-10-CM

## 2020-03-08 ENCOUNTER — Ambulatory Visit (HOSPITAL_COMMUNITY): Payer: Self-pay | Admitting: Psychiatry

## 2020-03-15 ENCOUNTER — Encounter: Payer: Self-pay | Admitting: Podiatry

## 2020-03-15 ENCOUNTER — Other Ambulatory Visit: Payer: Self-pay

## 2020-03-15 ENCOUNTER — Inpatient Hospital Stay
Admission: EM | Admit: 2020-03-15 | Discharge: 2020-03-23 | DRG: 617 | Disposition: A | Discharge status: Home / Self Care | Payer: Self-pay | Attending: Internal Medicine | Admitting: Internal Medicine

## 2020-03-15 ENCOUNTER — Ambulatory Visit (INDEPENDENT_AMBULATORY_CARE_PROVIDER_SITE_OTHER): Payer: Self-pay

## 2020-03-15 ENCOUNTER — Encounter: Payer: Self-pay | Admitting: Emergency Medicine

## 2020-03-15 ENCOUNTER — Inpatient Hospital Stay: Payer: Self-pay

## 2020-03-15 ENCOUNTER — Ambulatory Visit (INDEPENDENT_AMBULATORY_CARE_PROVIDER_SITE_OTHER): Payer: Self-pay | Admitting: Podiatry

## 2020-03-15 VITALS — Temp 98.3°F

## 2020-03-15 DIAGNOSIS — J439 Emphysema, unspecified: Secondary | ICD-10-CM | POA: Diagnosis present

## 2020-03-15 DIAGNOSIS — I96 Gangrene, not elsewhere classified: Secondary | ICD-10-CM | POA: Diagnosis present

## 2020-03-15 DIAGNOSIS — E1169 Type 2 diabetes mellitus with other specified complication: Principal | ICD-10-CM | POA: Diagnosis present

## 2020-03-15 DIAGNOSIS — E1165 Type 2 diabetes mellitus with hyperglycemia: Secondary | ICD-10-CM | POA: Diagnosis present

## 2020-03-15 DIAGNOSIS — M869 Osteomyelitis, unspecified: Secondary | ICD-10-CM | POA: Diagnosis present

## 2020-03-15 DIAGNOSIS — E785 Hyperlipidemia, unspecified: Secondary | ICD-10-CM | POA: Diagnosis present

## 2020-03-15 DIAGNOSIS — F17200 Nicotine dependence, unspecified, uncomplicated: Secondary | ICD-10-CM | POA: Diagnosis present

## 2020-03-15 DIAGNOSIS — Z7984 Long term (current) use of oral hypoglycemic drugs: Secondary | ICD-10-CM

## 2020-03-15 DIAGNOSIS — M86172 Other acute osteomyelitis, left ankle and foot: Secondary | ICD-10-CM | POA: Diagnosis present

## 2020-03-15 DIAGNOSIS — E11621 Type 2 diabetes mellitus with foot ulcer: Secondary | ICD-10-CM | POA: Diagnosis present

## 2020-03-15 DIAGNOSIS — B951 Streptococcus, group B, as the cause of diseases classified elsewhere: Secondary | ICD-10-CM | POA: Diagnosis present

## 2020-03-15 DIAGNOSIS — Z880 Allergy status to penicillin: Secondary | ICD-10-CM

## 2020-03-15 DIAGNOSIS — Z20822 Contact with and (suspected) exposure to covid-19: Secondary | ICD-10-CM | POA: Diagnosis present

## 2020-03-15 DIAGNOSIS — I10 Essential (primary) hypertension: Secondary | ICD-10-CM | POA: Diagnosis present

## 2020-03-15 DIAGNOSIS — E11628 Type 2 diabetes mellitus with other skin complications: Secondary | ICD-10-CM

## 2020-03-15 DIAGNOSIS — Z91030 Bee allergy status: Secondary | ICD-10-CM

## 2020-03-15 DIAGNOSIS — E1152 Type 2 diabetes mellitus with diabetic peripheral angiopathy with gangrene: Secondary | ICD-10-CM | POA: Diagnosis present

## 2020-03-15 DIAGNOSIS — E1140 Type 2 diabetes mellitus with diabetic neuropathy, unspecified: Secondary | ICD-10-CM | POA: Diagnosis present

## 2020-03-15 DIAGNOSIS — F17210 Nicotine dependence, cigarettes, uncomplicated: Secondary | ICD-10-CM | POA: Diagnosis present

## 2020-03-15 DIAGNOSIS — Z79899 Other long term (current) drug therapy: Secondary | ICD-10-CM

## 2020-03-15 DIAGNOSIS — L97529 Non-pressure chronic ulcer of other part of left foot with unspecified severity: Secondary | ICD-10-CM | POA: Diagnosis present

## 2020-03-15 DIAGNOSIS — L03116 Cellulitis of left lower limb: Secondary | ICD-10-CM

## 2020-03-15 DIAGNOSIS — L089 Local infection of the skin and subcutaneous tissue, unspecified: Secondary | ICD-10-CM

## 2020-03-15 DIAGNOSIS — R059 Cough, unspecified: Secondary | ICD-10-CM

## 2020-03-15 DIAGNOSIS — E7800 Pure hypercholesterolemia, unspecified: Secondary | ICD-10-CM | POA: Diagnosis present

## 2020-03-15 DIAGNOSIS — K219 Gastro-esophageal reflux disease without esophagitis: Secondary | ICD-10-CM | POA: Diagnosis present

## 2020-03-15 DIAGNOSIS — F329 Major depressive disorder, single episode, unspecified: Secondary | ICD-10-CM | POA: Diagnosis present

## 2020-03-15 DIAGNOSIS — E781 Pure hyperglyceridemia: Secondary | ICD-10-CM | POA: Diagnosis present

## 2020-03-15 DIAGNOSIS — F419 Anxiety disorder, unspecified: Secondary | ICD-10-CM | POA: Diagnosis present

## 2020-03-15 LAB — CBC WITH DIFFERENTIAL/PLATELET
Abs Immature Granulocytes: 0.07 10*3/uL (ref 0.00–0.07)
Basophils Absolute: 0.1 10*3/uL (ref 0.0–0.1)
Basophils Relative: 1 %
Eosinophils Absolute: 0.2 10*3/uL (ref 0.0–0.5)
Eosinophils Relative: 2 %
HCT: 37.5 % — ABNORMAL LOW (ref 39.0–52.0)
Hemoglobin: 13.3 g/dL (ref 13.0–17.0)
Immature Granulocytes: 1 %
Lymphocytes Relative: 17 %
Lymphs Abs: 1.7 10*3/uL (ref 0.7–4.0)
MCH: 34.5 pg — ABNORMAL HIGH (ref 26.0–34.0)
MCHC: 35.5 g/dL (ref 30.0–36.0)
MCV: 97.2 fL (ref 80.0–100.0)
Monocytes Absolute: 1 10*3/uL (ref 0.1–1.0)
Monocytes Relative: 10 %
Neutro Abs: 7 10*3/uL (ref 1.7–7.7)
Neutrophils Relative %: 69 %
Platelets: 308 10*3/uL (ref 150–400)
RBC: 3.86 MIL/uL — ABNORMAL LOW (ref 4.22–5.81)
RDW: 12.5 % (ref 11.5–15.5)
WBC: 9.9 10*3/uL (ref 4.0–10.5)
nRBC: 0 % (ref 0.0–0.2)

## 2020-03-15 LAB — COMPREHENSIVE METABOLIC PANEL
ALT: 21 U/L (ref 0–44)
AST: 21 U/L (ref 15–41)
Albumin: 3.8 g/dL (ref 3.5–5.0)
Alkaline Phosphatase: 99 U/L (ref 38–126)
Anion gap: 11 (ref 5–15)
BUN: 17 mg/dL (ref 6–20)
CO2: 24 mmol/L (ref 22–32)
Calcium: 9.1 mg/dL (ref 8.9–10.3)
Chloride: 97 mmol/L — ABNORMAL LOW (ref 98–111)
Creatinine, Ser: 0.77 mg/dL (ref 0.61–1.24)
GFR calc Af Amer: >60 mL/min (ref >60)
GFR calc non Af Amer: >60 mL/min (ref >60)
Glucose, Bld: 236 mg/dL — ABNORMAL HIGH (ref 70–99)
Potassium: 3.7 mmol/L (ref 3.5–5.1)
Sodium: 132 mmol/L — ABNORMAL LOW (ref 135–145)
Total Bilirubin: 0.7 mg/dL (ref 0.3–1.2)
Total Protein: 7.9 g/dL (ref 6.5–8.1)

## 2020-03-15 LAB — GLUCOSE, CAPILLARY
Glucose-Capillary: 209 mg/dL — ABNORMAL HIGH (ref 70–99)
Glucose-Capillary: 190 mg/dL — ABNORMAL HIGH (ref 70–99)

## 2020-03-15 LAB — RESPIRATORY PANEL BY RT PCR (FLU A&B, COVID)
Influenza A by PCR: NEGATIVE
Influenza B by PCR: NEGATIVE
SARS Coronavirus 2 by RT PCR: NEGATIVE

## 2020-03-15 LAB — LACTIC ACID, PLASMA: Lactic Acid, Venous: 1.3 mmol/L (ref 0.5–1.9)

## 2020-03-15 LAB — SEDIMENTATION RATE: Sed Rate: 95 mm/hr — ABNORMAL HIGH (ref 0–20)

## 2020-03-15 MED ORDER — ALBUTEROL SULFATE (2.5 MG/3ML) 0.083% IN NEBU: 2.5000 mg | INHALATION_SOLUTION | RESPIRATORY_TRACT | Status: DC | PRN

## 2020-03-15 MED ORDER — ONDANSETRON HCL 4 MG/2ML IJ SOLN
4.0000 mg | Freq: Four times a day (QID) | INTRAMUSCULAR | Status: DC | PRN
  Administered 2020-03-17: 4 mg via INTRAVENOUS
  Filled 2020-03-15: qty 2

## 2020-03-15 MED ORDER — VANCOMYCIN HCL IN DEXTROSE 1-5 GM/200ML-% IV SOLN
1000.0000 mg | Freq: Once | INTRAVENOUS | Status: AC
  Administered 2020-03-15: 1000 mg via INTRAVENOUS
  Filled 2020-03-15: qty 200

## 2020-03-15 MED ORDER — HYDRALAZINE HCL 20 MG/ML IJ SOLN: 10.0000 mg | INTRAMUSCULAR | Status: DC | PRN

## 2020-03-15 MED ORDER — ACETAMINOPHEN 325 MG PO TABS
650.0000 mg | ORAL_TABLET | Freq: Four times a day (QID) | ORAL | Status: DC | PRN
  Administered 2020-03-19 – 2020-03-22 (×3): 650 mg via ORAL
  Filled 2020-03-15 (×2): qty 2

## 2020-03-15 MED ORDER — INSULIN ASPART 100 UNIT/ML ~~LOC~~ SOLN
0.0000 [IU] | Freq: Every day | SUBCUTANEOUS | Status: DC
  Administered 2020-03-20 – 2020-03-21 (×2): 2 [IU] via SUBCUTANEOUS
  Administered 2020-03-22: 4 [IU] via SUBCUTANEOUS
  Filled 2020-03-15 (×3): qty 1

## 2020-03-15 MED ORDER — ONDANSETRON HCL 4 MG PO TABS: 4.0000 mg | ORAL_TABLET | Freq: Four times a day (QID) | ORAL | Status: DC | PRN

## 2020-03-15 MED ORDER — MOMETASONE FURO-FORMOTEROL FUM 200-5 MCG/ACT IN AERO
2.0000 | INHALATION_SPRAY | Freq: Two times a day (BID) | RESPIRATORY_TRACT | Status: DC
  Administered 2020-03-16 – 2020-03-23 (×14): 2 via RESPIRATORY_TRACT
  Filled 2020-03-15: qty 8.8

## 2020-03-15 MED ORDER — NICOTINE 21 MG/24HR TD PT24
21.0000 mg | MEDICATED_PATCH | Freq: Every day | TRANSDERMAL | Status: DC
  Administered 2020-03-15 – 2020-03-23 (×9): 21 mg via TRANSDERMAL
  Filled 2020-03-15 (×9): qty 1

## 2020-03-15 MED ORDER — MORPHINE SULFATE (PF) 2 MG/ML IV SOLN: 2.0000 mg | INTRAVENOUS | Status: DC | PRN

## 2020-03-15 MED ORDER — GADOBUTROL 1 MMOL/ML IV SOLN
8.0000 mL | Freq: Once | INTRAVENOUS | Status: AC | PRN
  Administered 2020-03-15: 8 mL via INTRAVENOUS
  Filled 2020-03-15: qty 8

## 2020-03-15 MED ORDER — VANCOMYCIN HCL IN DEXTROSE 1-5 GM/200ML-% IV SOLN
1000.0000 mg | Freq: Three times a day (TID) | INTRAVENOUS | Status: DC
  Administered 2020-03-16: 1000 mg via INTRAVENOUS
  Filled 2020-03-15 (×3): qty 200

## 2020-03-15 MED ORDER — ACETAMINOPHEN 650 MG RE SUPP: 650.0000 mg | Freq: Four times a day (QID) | RECTAL | Status: DC | PRN

## 2020-03-15 MED ORDER — ENOXAPARIN SODIUM 40 MG/0.4ML ~~LOC~~ SOLN
40.0000 mg | SUBCUTANEOUS | Status: DC
  Administered 2020-03-15 – 2020-03-20 (×6): 40 mg via SUBCUTANEOUS
  Filled 2020-03-15 (×5): qty 0.4

## 2020-03-15 MED ORDER — PANTOPRAZOLE SODIUM 40 MG PO TBEC
40.0000 mg | DELAYED_RELEASE_TABLET | Freq: Every day | ORAL | Status: DC
  Administered 2020-03-15 – 2020-03-23 (×9): 40 mg via ORAL
  Filled 2020-03-15 (×9): qty 1

## 2020-03-15 MED ORDER — CITALOPRAM HYDROBROMIDE 20 MG PO TABS
20.0000 mg | ORAL_TABLET | Freq: Every day | ORAL | Status: DC
  Administered 2020-03-15 – 2020-03-23 (×9): 20 mg via ORAL
  Filled 2020-03-15 (×9): qty 1

## 2020-03-15 MED ORDER — CLONIDINE HCL 0.1 MG PO TABS
0.1000 mg | ORAL_TABLET | Freq: Every day | ORAL | Status: DC
  Administered 2020-03-15 – 2020-03-22 (×8): 0.1 mg via ORAL
  Filled 2020-03-15 (×8): qty 1

## 2020-03-15 MED ORDER — ROSUVASTATIN CALCIUM 5 MG PO TABS
5.0000 mg | ORAL_TABLET | Freq: Every day | ORAL | Status: DC
  Administered 2020-03-15 – 2020-03-23 (×9): 5 mg via ORAL
  Filled 2020-03-15 (×10): qty 1

## 2020-03-15 MED ORDER — OXYCODONE HCL 5 MG PO TABS
5.0000 mg | ORAL_TABLET | ORAL | Status: DC | PRN
  Administered 2020-03-16 – 2020-03-23 (×15): 5 mg via ORAL
  Filled 2020-03-15 (×12): qty 1

## 2020-03-15 MED ORDER — SODIUM CHLORIDE 0.9 % IV SOLN
1.0000 g | Freq: Three times a day (TID) | INTRAVENOUS | Status: DC
  Administered 2020-03-15 – 2020-03-16 (×4): 1 g via INTRAVENOUS
  Filled 2020-03-15 (×5): qty 1

## 2020-03-15 MED ORDER — TRAZODONE HCL 50 MG PO TABS
25.0000 mg | ORAL_TABLET | Freq: Every evening | ORAL | Status: DC | PRN
  Administered 2020-03-18 – 2020-03-21 (×4): 25 mg via ORAL
  Filled 2020-03-15 (×4): qty 1

## 2020-03-15 MED ORDER — GABAPENTIN 300 MG PO CAPS
300.0000 mg | ORAL_CAPSULE | Freq: Two times a day (BID) | ORAL | Status: DC
  Administered 2020-03-15 – 2020-03-23 (×16): 300 mg via ORAL
  Filled 2020-03-15 (×16): qty 1

## 2020-03-15 MED ORDER — SODIUM CHLORIDE 0.9 % IV SOLN: INTRAVENOUS | Status: DC

## 2020-03-15 MED ORDER — SENNOSIDES-DOCUSATE SODIUM 8.6-50 MG PO TABS: 1.0000 | ORAL_TABLET | Freq: Every evening | ORAL | Status: DC | PRN

## 2020-03-15 MED ORDER — INSULIN ASPART 100 UNIT/ML ~~LOC~~ SOLN
0.0000 [IU] | Freq: Three times a day (TID) | SUBCUTANEOUS | Status: DC
  Administered 2020-03-15 – 2020-03-16 (×2): 5 [IU] via SUBCUTANEOUS
  Administered 2020-03-17: 2 [IU] via SUBCUTANEOUS
  Administered 2020-03-17 – 2020-03-18 (×4): 3 [IU] via SUBCUTANEOUS
  Administered 2020-03-19: 2 [IU] via SUBCUTANEOUS
  Administered 2020-03-19: 5 [IU] via SUBCUTANEOUS
  Administered 2020-03-19 – 2020-03-20 (×3): 3 [IU] via SUBCUTANEOUS
  Administered 2020-03-21 (×2): 5 [IU] via SUBCUTANEOUS
  Administered 2020-03-21: 3 [IU] via SUBCUTANEOUS
  Administered 2020-03-22: 5 [IU] via SUBCUTANEOUS
  Administered 2020-03-23: 3 [IU] via SUBCUTANEOUS
  Administered 2020-03-23: 5 [IU] via SUBCUTANEOUS
  Filled 2020-03-15 (×18): qty 1

## 2020-03-15 MED ORDER — AMLODIPINE BESYLATE 5 MG PO TABS
5.0000 mg | ORAL_TABLET | Freq: Every day | ORAL | Status: DC
  Administered 2020-03-15 – 2020-03-19 (×5): 5 mg via ORAL
  Filled 2020-03-15 (×5): qty 1

## 2020-03-15 NOTE — ED Provider Notes (Signed)
[facility] Emergency Department Provider Note   ____________________________________________   First MD Initiated Contact with Patient 03/15/20 1530     (approximate)  I have reviewed the triage vital signs and the nursing notes.   HISTORY  Chief Complaint Wound Infection   HPI Timothy [name]. is a 59 y.o. male patient sent from podiatrist office with soft tissue infection and osteomyelitis.  He reports this been going on for couple days and gotten a lot worse in the last 24 hours.  Patient does not remember his injury.  He has diabetes and neuropathy.  He does not remember hitting himself.  He says his leg is aching all the way up to just above the knee.  It feels like it is bruised there.  He is not running a fever.   He had an x-ray done in the podiatrist office that was read as osteomyelitis of the distal phalanx of the great toe.  Podiatrist removed his great toenail.      Past Medical History:  Diagnosis Date  . Diabetes mellitus without complication ([place])   . Hypercholesteremia   . Hypertension     Patient Active Problem List   Diagnosis Date Noted  . Open wound of foot 01/20/2020  . Elevated lipids 01/20/2020  . Personal history of colonic polyps   . Shortness of breath 11/04/2019  . Encounter to establish care 10/15/2019  . Essential hypertension 10/15/2019  . Controlled diabetes mellitus type 2 with complications ([place]) 98/26/4158  . Smoking 10/15/2019  . Peripheral neuropathy 10/15/2019  . Cramp, abdominal 10/15/2019  . History of hepatitis 10/15/2019  . History of gastroesophageal reflux (GERD) 10/15/2019  . Type 2 diabetes mellitus with diabetic neuropathy, unspecified ([place]) 10/15/2019  . Post-traumatic stress disorder, unspecified 07/14/2019  . Antisocial personality disorder ([place]) 07/14/2019    Past Surgical History:  Procedure Laterality Date  . COLONOSCOPY WITH PROPOFOL N/A 12/22/2019   Procedure: COLONOSCOPY WITH  PROPOFOL;  Surgeon: [name], MD;  Location: [facility] ENDOSCOPY;  Service: Endoscopy;  Laterality: N/A;  . TONSILLECTOMY    . WISDOM TOOTH EXTRACTION      Prior to Admission medications   Medication Sig Start Date End Date Taking? Authorizing Provider  Acetaminophen (ARTHRITIS PAIN PO) Take 1,000 mg by mouth daily.    [provider]  albuterol (VENTOLIN HFA) 108 (90 Base) MCG/ACT inhaler Inhale 1 puff into the lungs every 6 (six) hours as needed for wheezing or shortness of breath. 11/04/19   Iloabachie, Chioma E, NP  amLODipine (NORVASC) 5 MG tablet TAKE ONE TABLET BY MOUTH EVERY DAY 03/08/20   Iloabachie, Chioma E, NP  blood glucose meter kit and supplies KIT Dispense based on patient and insurance preference. Use up to four times daily as directed. (FOR ICD-9 250.00, 250.01). 10/15/19   Iloabachie, Chioma E, NP  calcium carbonate (TUMS - DOSED IN MG ELEMENTAL CALCIUM) 500 MG chewable tablet Chew by mouth.    [provider]  citalopram (CELEXA) 20 MG tablet Take 1 tablet (20 mg total) by mouth daily. 01/20/20   Iloabachie, Chioma E, NP  cloNIDine (CATAPRES) 0.1 MG tablet Take 1 tablet (0.1 mg total) by mouth at bedtime. 01/20/20   Iloabachie, Chioma E, NP  esomeprazole (NEXIUM) 20 MG capsule Take 1 capsule (20 mg total) by mouth daily at 12 noon. 11/04/19   Iloabachie, Chioma E, NP  Fluticasone-Salmeterol (ADVAIR) 250-50 MCG/DOSE AEPB Inhale 1 puff into the lungs 2 (two) times daily. 01/20/20   Iloabachie,  Chioma E, NP  gabapentin (NEURONTIN) 300 MG capsule Take 1 capsule (300 mg total) by mouth 2 (two) times daily. 01/20/20   Iloabachie, Chioma E, NP  glipiZIDE (GLUCOTROL) 5 MG tablet Take 0.5 tablets (2.5 mg total) by mouth 2 (two) times daily before a meal. 02/17/20   Iloabachie, Chioma E, NP  Magnesium 400 MG CAPS Take 400 mg by mouth daily.    [provider]  metFORMIN (GLUCOPHAGE) 1000 MG tablet Take 1 tablet (1,000 mg total) by mouth 2 (two) times daily with a meal. 01/20/20  01/19/21  Iloabachie, Chioma E, NP  rosuvastatin (CRESTOR) 5 MG tablet Take 1 tablet (5 mg total) by mouth daily. 02/17/20   Iloabachie, Chioma E, NP    Allergies Bee venom, Coffee flavor, Cheese, and Penicillins  History reviewed. No pertinent family history.  Social History Social History   Tobacco Use  . Smoking status: Current Every Day Smoker    Packs/day: 1.50    Years: 41.00    Pack years: 61.50    Types: Cigarettes  . Smokeless tobacco: Never Used  Vaping Use  . Vaping Use: Never used  Substance Use Topics  . Alcohol use: Yes    Alcohol/week: 6.0 standard drinks    Types: 6 Cans of beer per week    Comment: occasionally   . Drug use: Not Currently    Review of Systems  Constitutional: No fever/chills Eyes: No visual changes. ENT: No sore throat. Cardiovascular: Denies chest pain. Respiratory: Denies shortness of breath. Gastrointestinal: No abdominal pain.  No nausea, no vomiting.  No diarrhea.  No constipation. Genitourinary: Negative for dysuria. Musculoskeletal: Negative for back pain. Skin: Negative for rash. Neurological: Negative for headaches, focal weakness   ____________________________________________   PHYSICAL EXAM:  VITAL SIGNS: ED Triage Vitals  Enc Vitals Group     BP 03/15/20 1524 121/70     Pulse Rate 03/15/20 1524 88     Resp 03/15/20 1524 16     Temp 03/15/20 1524 98.4 F (36.9 C)     Temp Source 03/15/20 1524 Oral     SpO2 03/15/20 1524 100 %     Weight 03/15/20 1519 187 lb (84.8 kg)     Height 03/15/20 1519 _0  (1.778 m)     Head Circumference --      Peak Flow --      Pain Score 03/15/20 1519 3     Pain Loc --      Pain Edu? --      Excl. in [place]? --     Constitutional: Alert and oriented. Well appearing and in no acute distress. Eyes: Conjunctivae are normal. PER Head: Atraumatic. Nose: No congestion/rhinnorhea. Mouth/Throat: Mucous membranes are moist.  Oropharynx non-erythematous. Neck: No stridor.    Cardiovascular: Normal rate, regular rhythm. Grossly normal heart sounds.  Good peripheral circulation. Respiratory: Normal respiratory effort.  No retractions. Lungs CTAB. Gastrointestinal: Soft and nontender. No distention. No abdominal bruits. No CVA tenderness. Musculoskeletal: Right leg is normal left foot especially around the great toe is red swollen and discolored.  The redness gets less intense as does the warmth but there is still swelling redness and warmth all the way up into the calf. Neurologic:  Normal speech and language. No gross focal neurologic deficits are appreciated. No gait instability. Skin:  Skin is warm, dry and intact except for where the great toenail was removed. No rash noted.   ____________________________________________   LABS (all labs ordered are listed, but only abnormal  results are displayed)  Labs Reviewed  CBC WITH DIFFERENTIAL/PLATELET - Abnormal; Notable for the following components:      Result Value   RBC 3.86 (*)    HCT 37.5 (*)    MCH 34.5 (*)    All other components within normal limits  RESPIRATORY PANEL BY RT PCR (FLU A&B, COVID)  COMPREHENSIVE METABOLIC PANEL  LACTIC ACID, PLASMA  LACTIC ACID, PLASMA  SEDIMENTATION RATE   ____________________________________________  EKG   ____________________________________________  RADIOLOGY  ED MD interpretation: Outside x-ray read as osteomyelitis of the distal phalanx of the great toe on the left  Official radiology report(s): No results found.  ____________________________________________   PROCEDURES  Procedure(s) performed (including Critical Care):  Procedures   ____________________________________________   INITIAL IMPRESSION / ASSESSMENT AND PLAN / ED COURSE  I have ordered CBC CMP and sed rate for this gentleman.  He is allergic to penicillin.  He does not remember the type of allergy he had we will get him some vancomycin and meropenem.  He has a listed allergy to  penicillin but as he remembers it was an overdose to penicillin or of penicillin.  There is reported to be very low risk of cross sensitivity between penicillin and meropenem anyway.              ____________________________________________   FINAL CLINICAL IMPRESSION(S) / ED DIAGNOSES  Final diagnoses:  Diabetic foot infection ([place])  Osteomyelitis of left foot, unspecified type [facility])     ED Discharge Orders    None      *Please note:  Paris [place]. was evaluated in Emergency Department on 03/15/2020 for the symptoms described in the history of present illness. He was evaluated in the context of the global COVID-19 pandemic, which necessitated consideration that the patient might be at risk for infection with the SARS-CoV-2 virus that causes COVID-19. Institutional protocols and algorithms that pertain to the evaluation of patients at risk for COVID-19 are in a state of rapid change based on information released by regulatory bodies including the CDC and federal and state organizations. These policies and algorithms were followed during the patient's care in the ED.  Some ED evaluations and interventions may be delayed as a result of limited staffing during and the pandemic.*   Note:  This document was prepared using Dragon voice recognition software and may include unintentional dictation errors.    Nena Polio, MD 03/15/20 1556

## 2020-03-15 NOTE — ED Notes (Signed)
Podiatry at [facility]

## 2020-03-15 NOTE — ED Triage Notes (Addendum)
Pt here for foot infection.  [facility] from [facility] for possible [facility]o and IV abx.  Toe nail was removed at foot and ankle doctor.  Redness and purple discoloration to left great toe and moving up foot.  Warm to touch. Xray done outpatient, called radiology to see if can get read done as STAT since  ED pt.

## 2020-03-15 NOTE — ED Notes (Signed)
Pt transported to MRI 

## 2020-03-15 NOTE — ED Notes (Signed)
Pt made aware NPO at midnight

## 2020-03-15 NOTE — Progress Notes (Signed)
Subjective:  Patient ID: Timothy [name]., male    DOB: Nov 10, 1960,  MRN: [id number]  Chief Complaint  Patient presents with  . Foot Pain    Patient presents today for infection left hallux/foot which started about 3 days ago    59 y.o. male presents for wound care.  Patient presents with left soft tissue infection with possible concern for underlying osteomyelitis.  Patient states that this has been going for quite a few days and it got really badly red over the last 24 hours.  He is a diabetic with unknown A1c.  He has not been seen by me previously.  He does not have any systemic signs of infection including no nausea fever chills vomiting shortness of breath.  He states it got worse over the last 24 hours and denies any other acute complaints.   Review of Systems: Negative except as noted in the HPI. Denies N/V/F/Ch.  Past Medical History:  Diagnosis Date  . Diabetes mellitus without complication ([place])   . Hypercholesteremia   . Hypertension     Current Outpatient Medications:  .  Acetaminophen (ARTHRITIS PAIN PO), Take 1,000 mg by mouth daily., Disp: , Rfl:  .  albuterol (VENTOLIN HFA) 108 (90 Base) MCG/ACT inhaler, Inhale 1 puff into the lungs every 6 (six) hours as needed for wheezing or shortness of breath., Disp: 18 g, Rfl: 2 .  amLODipine (NORVASC) 5 MG tablet, TAKE ONE TABLET BY MOUTH EVERY DAY, Disp: 30 tablet, Rfl: 0 .  blood glucose meter kit and supplies KIT, Dispense based on patient and insurance preference. Use up to four times daily as directed. (FOR ICD-9 250.00, 250.01)., Disp: 1 each, Rfl: 0 .  calcium carbonate (TUMS - DOSED IN MG ELEMENTAL CALCIUM) 500 MG chewable tablet, Chew by mouth., Disp: , Rfl:  .  citalopram (CELEXA) 20 MG tablet, Take 1 tablet (20 mg total) by mouth daily., Disp: 56 tablet, Rfl: 0 .  cloNIDine (CATAPRES) 0.1 MG tablet, Take 1 tablet (0.1 mg total) by mouth at bedtime., Disp: 56 tablet, Rfl: 0 .  esomeprazole (NEXIUM) 20 MG capsule,  Take 1 capsule (20 mg total) by mouth daily at 12 noon., Disp: 30 capsule, Rfl: 3 .  Fluticasone-Salmeterol (ADVAIR) 250-50 MCG/DOSE AEPB, Inhale 1 puff into the lungs 2 (two) times daily., Disp: 60 each, Rfl: 1 .  gabapentin (NEURONTIN) 300 MG capsule, Take 1 capsule (300 mg total) by mouth 2 (two) times daily., Disp: 60 capsule, Rfl: 2 .  glipiZIDE (GLUCOTROL) 5 MG tablet, Take 0.5 tablets (2.5 mg total) by mouth 2 (two) times daily before a meal., Disp: 60 tablet, Rfl: 1 .  Magnesium 400 MG CAPS, Take 400 mg by mouth daily., Disp: , Rfl:  .  metFORMIN (GLUCOPHAGE) 1000 MG tablet, Take 1 tablet (1,000 mg total) by mouth 2 (two) times daily with a meal., Disp: 60 tablet, Rfl: 2 .  rosuvastatin (CRESTOR) 5 MG tablet, Take 1 tablet (5 mg total) by mouth daily., Disp: 30 tablet, Rfl: 3  Social History   Tobacco Use  Smoking Status Current Every Day Smoker  . Packs/day: 1.50  . Years: 41.00  . Pack years: 61.50  . Types: Cigarettes  Smokeless Tobacco Never Used    Allergies  Allergen Reactions  . Bee Venom Anaphylaxis  . Coffee Flavor Shortness Of Breath  . Cheese Swelling    Blue Cheese only  . Penicillins Other (See Comments)    unknown   Objective:   Vitals:  03/15/20 1333  Temp: 98.3 F (36.8 C)   There is no height or weight on file to calculate BMI. Constitutional Well developed. Well nourished.  Vascular Dorsalis pedis pulses palpable bilaterally. Posterior tibial pulses palpable bilaterally. Capillary refill normal to all digits.  No cyanosis or clubbing noted. Pedal hair growth normal.  Neurologic Normal speech. Oriented to person, place, and time. Protective sensation absent  Dermatologic Wound Location: Left hallux nail contusion with cellulitis up to the midfoot.  Purulent drainage noted probe down to bone with exposure of distal phalanx.  Malodor present Wound Base: Mixed Granular/Fibrotic Peri-wound: Reddened Exudate: Scant/small amount Serous exudate,  Purulent exudate  Orthopedic: No pain to palpation either foot.   Radiographs: 3 views of skeletally mature adult left foot: Osteomyelitic changes noted to the distal tip of the phalanx left side.  No soft tissue emphysema noted.  No other bony abnormalities noted. Assessment:   1. Osteomyelitis of great toe of left foot ([place])   2. Cellulitis of foot, left   3. Soft tissue infection    Plan:  Patient was evaluated and treated and all questions answered.  Ulcer left hallux with cellulitis to the midfoot with underlying osteomyelitis of the distal phalanx of the left hallux -Debridement as below. -Dressed with Betadine wet-to-dry, DSD. -Patient was directed to be admitted at [facility] as patient is not able to go over to [facility] a long hospital where I have privileges.  I discussed with the patient that he will be taking care of by another podiatry group.  Patient states understanding.  I instructed him go to the emergency room right away for IV antibiotics and an MRI evaluation to rule out osteomyelitis.  At this time I am highly concerned that patient will loses big toe joint as well as the metatarsal.  I discussed with the patient patient states understanding. -I instructed the patient that if for some reason he is lost to follow-up I would like for him to be seen right away.  Patient states understanding. -A total of 23 minutes was spent in direct patient care as well as pre and post patient encounter activities.  This includes documentation as well as reviewing patient chart for labs, imaging, past medical, surgical, social, and family history as documented in the EMR.  I have reviewed medication allergies as documented in EMR.  I discussed the etiology of condition and treatment options from conservative to surgical care.  All risks and benefit of the treatment course was discussed in detail.  All questions were answered and return appointment was discussed.  Since the visit  completed in an ambulatory/outpatient setting, the patient and/or parent/guardian has been advised to contact the providers office for worsening condition and seek medical treatment and/or call 911 if the patient deems either is necessary.  No follow-ups on file.

## 2020-03-15 NOTE — Progress Notes (Signed)
This encounter was created in error - please disregard.

## 2020-03-15 NOTE — Consult Note (Signed)
Pharmacy Antibiotic Note  Timothy B Eccleston Jr. is a 59 y.o. male with medical history including diabetes, tobacco abuse admitted on 03/15/2020 with left hallux infection. Imaging is pending but there is concern for osteomyelitis. Pharmacy has been consulted for vancomycin dosing. Patient is also ordered meropenem given history of penicillin allergy.  Plan:  Vancomycin 2 g (1 g + 1 g) IV LD x 1   Followed by maintenance regimen of vancomycin 1 g IV q8h per nomogram  --Goal trough 15 - 20 mcg/mL given concern for osteomyelitis --Daily Scr per protocol --Narrow antibiotics as appropriate when cultures become available  Height: 5' 10" (177.8 cm) Weight: 84.8 kg (187 lb) IBW/kg (Calculated) : 73  Temp (24hrs), Avg:98.4 F (36.9 C), Min:98.3 F (36.8 C), Max:98.4 F (36.9 C)  Recent Labs  Lab 03/15/20 1531  WBC 9.9  CREATININE 0.77  LATICACIDVEN 1.3    Estimated Creatinine Clearance: 103.9 mL/min (by C-G formula based on SCr of 0.77 mg/dL).    Allergies  Allergen Reactions  . Bee Venom Anaphylaxis  . Coffee Flavor Shortness Of Breath  . Cheese Swelling    Blue Cheese only  . Penicillins Other (See Comments)    unknown    Antimicrobials this admission: Vancomycin 9/28 >>  Meropenem 9/28 >>   Dose adjustments this admission: N/A  Microbiology results:  Thank you for allowing pharmacy to be a part of this patient's care.  Timothy [name] 03/15/2020 5:19 PM 

## 2020-03-15 NOTE — H&P (Signed)
History and Physical    Timothy [name]. OPF:[id number] DOB: 07/16/1960 DOA: 03/15/2020  PCP: Langston Reusing, NP  Patient coming from: Home  I have personally briefly reviewed patient's old medical records in [place]  Chief Complaint: Left hallux infection  HPI: Timothy [name]. is a 59 y.o. male with medical history significant of hypertension, type 2 diabetes mellitus, hypercholesterolemia, tobacco abuse, depression/anxiety who presents to the ED for evaluation of a infection and his left hallux.  Patient states the infection and resultant redness have been going on for quite a few days and acutely worsened over the past 24 hours.  Last hemoglobin A1c was unknown.  Patient was in the podiatry office today and did not demonstrate any systemic signs of infection.  Podiatry note indicates ulcer to the left hallux with cellulitis to the midfoot with suspected underlying osteomyelitis of the distal phalanx.  Patient underwent office-based debridement and dressing with wet-to-dry and was directed to [facility] as the patient was not able to go to [facility] where his established podiatrist has privileges.  On my evaluation the patient is resting comfortably in bed.  He is in no visible distress.  His left foot is and gauze wrap.  There is significant cellulitic change the left midfoot.  The patient denies pain, fever.  He will be admitted to the hospitalist service with podiatry consult.  ED Course: On admission vital signs stable.  Patient received dose of vancomycin and meropenem given his penicillin allergy.  X-ray was ordered in the ED, results pending at time of this note.  Hospitalist contacted for admission.  Review of Systems: As per HPI otherwise 14 point review of systems negative.    Past Medical History:  Diagnosis Date  . Diabetes mellitus without complication ([place])   . Hypercholesteremia   . Hypertension     Past Surgical History:  Procedure  Laterality Date  . COLONOSCOPY WITH PROPOFOL N/A 12/22/2019   Procedure: COLONOSCOPY WITH PROPOFOL;  Surgeon: [name], MD;  Location: [facility] ENDOSCOPY;  Service: Endoscopy;  Laterality: N/A;  . TONSILLECTOMY    . WISDOM TOOTH EXTRACTION       reports that he has been smoking cigarettes. He has a 61.50 pack-year smoking history. He has never used smokeless tobacco. He reports current alcohol use of about 6.0 standard drinks of alcohol per week. He reports previous drug use.  Allergies  Allergen Reactions  . Bee Venom Anaphylaxis  . Coffee Flavor Shortness Of Breath  . Cheese Swelling    Blue Cheese only  . Penicillins Other (See Comments)    unknown    History reviewed. No pertinent family history. No family history of osteomyelitis  Prior to Admission medications   Medication Sig Start Date End Date Taking? Authorizing Provider  Acetaminophen (ARTHRITIS PAIN PO) Take 1,000 mg by mouth daily.    [provider]  albuterol (VENTOLIN HFA) 108 (90 Base) MCG/ACT inhaler Inhale 1 puff into the lungs every 6 (six) hours as needed for wheezing or shortness of breath. 11/04/19   Iloabachie, Chioma E, NP  amLODipine (NORVASC) 5 MG tablet TAKE ONE TABLET BY MOUTH EVERY DAY 03/08/20   Iloabachie, Chioma E, NP  blood glucose meter kit and supplies KIT Dispense based on patient and insurance preference. Use up to four times daily as directed. (FOR ICD-9 250.00, 250.01). 10/15/19   Iloabachie, Chioma E, NP  calcium carbonate (TUMS - DOSED IN MG ELEMENTAL CALCIUM) 500 MG chewable tablet Chew by  mouth.    [provider]  citalopram (CELEXA) 20 MG tablet Take 1 tablet (20 mg total) by mouth daily. 01/20/20   Iloabachie, Chioma E, NP  cloNIDine (CATAPRES) 0.1 MG tablet Take 1 tablet (0.1 mg total) by mouth at bedtime. 01/20/20   Iloabachie, Chioma E, NP  esomeprazole (NEXIUM) 20 MG capsule Take 1 capsule (20 mg total) by mouth daily at 12 noon. 11/04/19   Iloabachie, Chioma E, NP   Fluticasone-Salmeterol (ADVAIR) 250-50 MCG/DOSE AEPB Inhale 1 puff into the lungs 2 (two) times daily. 01/20/20   Iloabachie, Chioma E, NP  gabapentin (NEURONTIN) 300 MG capsule Take 1 capsule (300 mg total) by mouth 2 (two) times daily. 01/20/20   Iloabachie, Chioma E, NP  glipiZIDE (GLUCOTROL) 5 MG tablet Take 0.5 tablets (2.5 mg total) by mouth 2 (two) times daily before a meal. 02/17/20   Iloabachie, Chioma E, NP  Magnesium 400 MG CAPS Take 400 mg by mouth daily.    [provider]  metFORMIN (GLUCOPHAGE) 1000 MG tablet Take 1 tablet (1,000 mg total) by mouth 2 (two) times daily with a meal. 01/20/20 01/19/21  Iloabachie, Chioma E, NP  rosuvastatin (CRESTOR) 5 MG tablet Take 1 tablet (5 mg total) by mouth daily. 02/17/20   Caryl Asp E, NP    Physical Exam: Vitals:   03/15/20 1519 03/15/20 1524  BP:  121/70  Pulse:  88  Resp:  16  Temp:  98.4 F (36.9 C)  TempSrc:  Oral  SpO2:  100%  Weight: 84.8 kg   Height: '5\' 10"'  (1.778 m)      Vitals:   03/15/20 1519 03/15/20 1524  BP:  121/70  Pulse:  88  Resp:  16  Temp:  98.4 F (36.9 C)  TempSrc:  Oral  SpO2:  100%  Weight: 84.8 kg   Height: '5\' 10"'  (1.778 m)    Constitutional: NAD, calm, comfortable Eyes: PERRL, lids and conjunctivae normal ENMT: Mucous membranes are moist. Posterior pharynx clear of any exudate or lesions.Normal dentition.  Neck: normal, supple, no masses, no thyromegaly Respiratory: Lung sounds decreased bilaterally.  No adventitious sounds.  Normal work of breathing.  Room air Cardiovascular: Regular rate and rhythm, no murmurs / rubs / gallops. No extremity edema. 2+ pedal pulses. No carotid bruits.  Abdomen: no tenderness, no masses palpated. No hepatosplenomegaly. Bowel sounds positive.  Musculoskeletal: no clubbing / cyanosis. No joint deformity upper and lower extremities. Good ROM, no contractures. Normal muscle tone.  Skin: Left hallux in surgical wraps.  Left midfoot and forefoot with  cellulitic change Neurologic: Cranial nerves grossly intact.  Sensation decreased in bilateral lower extremities.  Strength 5 out of 5 in all 4 extremities psychiatric: Normal judgment and insight. Alert and oriented x 3. Normal mood.    Labs on Admission: I have personally reviewed following labs and imaging studies  CBC: Recent Labs  Lab 03/15/20 1531  WBC 9.9  NEUTROABS 7.0  HGB 13.3  HCT 37.5*  MCV 97.2  PLT 093   Basic Metabolic Panel: Recent Labs  Lab 03/15/20 1531  NA 132*  K 3.7  CL 97*  CO2 24  GLUCOSE 236*  BUN 17  CREATININE 0.77  CALCIUM 9.1   GFR: Estimated Creatinine Clearance: 103.9 mL/min (by C-G formula based on SCr of 0.77 mg/dL). Liver Function Tests: Recent Labs  Lab 03/15/20 1531  AST 21  ALT 21  ALKPHOS 99  BILITOT 0.7  PROT 7.9  ALBUMIN 3.8   No results for  input(s): LIPASE, AMYLASE in the last 168 hours. No results for input(s): AMMONIA in the last 168 hours. Coagulation Profile: No results for input(s): INR, PROTIME in the last 168 hours. Cardiac Enzymes: No results for input(s): CKTOTAL, CKMB, CKMBINDEX, TROPONINI in the last 168 hours. BNP (last 3 results) No results for input(s): PROBNP in the last 8760 hours. HbA1C: No results for input(s): HGBA1C in the last 72 hours. CBG: Recent Labs  Lab 03/15/20 1655  GLUCAP 209*   Lipid Profile: No results for input(s): CHOL, HDL, LDLCALC, TRIG, CHOLHDL, LDLDIRECT in the last 72 hours. Thyroid Function Tests: No results for input(s): TSH, T4TOTAL, FREET4, T3FREE, THYROIDAB in the last 72 hours. Anemia Panel: No results for input(s): VITAMINB12, FOLATE, FERRITIN, TIBC, IRON, RETICCTPCT in the last 72 hours. Urine analysis:    Component Value Date/Time   APPEARANCEUR Cloudy (A) 01/13/2020 1156   GLUCOSEU 3+ (A) 01/13/2020 1156   BILIRUBINUR Negative 01/13/2020 1156   PROTEINUR 1+ (A) 01/13/2020 1156   NITRITE Negative 01/13/2020 1156   LEUKOCYTESUR Negative 01/13/2020 1156     Radiological Exams on Admission: No results found.  EKG: Not performed in ED.  Ordered.  Pending at time of this note  Assessment/Plan Active Problems:   Osteomyelitis ([place])  Infection of left hallux Suspected osteomyelitis Left foot cellulitis Strong suspicion for osteomyelitis of left hallux. Patient nontoxic-appearing, afebrile, normal vitals White blood cell count within normal limits Plan: Admit inpatient Broad-spectrum antibiotics, vancomycin and meropenem (penicillin allergy) MRI left lower extremity with and without contrast Maintenance IV fluids Pain control as needed Podiatry consult, message sent to Dr. Cleda Mccreedy Carb controlled diet today, n.p.o. after midnight in case surgical admission is warranted  Hypertension Continue home amlodipine As needed hydralazine  Tobacco abuse Counseled patient Nicotine patch  Emphysema Continue home Dulera Albuterol neb as needed Submental oxygen as needed  Type 2 diabetes mellitus, poor control Patient on Metformin and glipizide Last hemoglobin A1c 9.3 on 01/13/2020 Plan: Hold oral agents Moderate sliding scale insulin Recheck hemoglobin A1c Carb modified diet  Hyperlipidemia Continue home Crestor  Anxiety/depression Continue with Celexa Continue nightly Catapres  GERD PPI  DVT prophylaxis: Lovenox  code Status: Full Family Communication: None at bedside.  Plan of care discussed with patient Disposition Plan: Anticipate return to previous home environment consults called: Podiatry-[facility] Admission status: Inpatient, MedSurg   Sidney Ace MD [facility]   If 7PM-7AM, please contact night-coverage   03/15/2020, 4:57 PM

## 2020-03-15 NOTE — Consult Note (Signed)
Reason for Consult: Osteomyelitis left hallux. Referring Physician: Shilo Pauwels. is an 59 y.o. male.  HPI: This is a 59 year old male with a history of diabetes with associated neuropathy with recent development of an ulceration on his left great toe that started last week.  States that within the last 24 to 48 hours notice significant increase in swelling and redness.  Patient was evaluated by another podiatry group outpatient and determined to have osteomyelitis with significant cellulitis and was referred to the emergency department for IV antibiotics and admission and definitive treatment.  Chart review as mentioned below revealed his most recent hemoglobin A1c to be 9.3.  Past Medical History:  Diagnosis Date  . Diabetes mellitus without complication (HCC)   . Hypercholesteremia   . Hypertension     Past Surgical History:  Procedure Laterality Date  . COLONOSCOPY WITH PROPOFOL N/A 12/22/2019   Procedure: COLONOSCOPY WITH PROPOFOL;  Surgeon: Midge Minium, MD;  Location: [facility] ENDOSCOPY;  Service: Endoscopy;  Laterality: N/A;  . TONSILLECTOMY    . WISDOM TOOTH EXTRACTION      History reviewed. No pertinent family history.  Social History:  reports that he has been smoking cigarettes. He has a 61.50 pack-year smoking history. He has never used smokeless tobacco. He reports current alcohol use of about 6.0 standard drinks of alcohol per week. He reports previous drug use.  Allergies:  Allergies  Allergen Reactions  . Bee Venom Anaphylaxis  . Coffee Flavor Shortness Of Breath  . Cheese Swelling    Blue Cheese only  . Penicillins Other (See Comments)    unknown    Medications:  Scheduled: . amLODipine  5 mg Oral Daily  . citalopram  20 mg Oral Daily  . cloNIDine  0.1 mg Oral QHS  . enoxaparin (LOVENOX) injection  40 mg Subcutaneous Q24H  . gabapentin  300 mg Oral BID  . insulin aspart  0-15 Units Subcutaneous TID WC  . insulin aspart  0-5 Units Subcutaneous  QHS  . mometasone-formoterol  2 puff Inhalation BID  . nicotine  21 mg Transdermal Daily  . pantoprazole  40 mg Oral Daily  . rosuvastatin  5 mg Oral Daily    Results for orders placed or performed during the hospital encounter of 03/15/20 (from the past 48 hour(s))  CBC with Differential     Status: Abnormal   Collection Time: 03/15/20  3:31 PM  Result Value Ref Range   WBC 9.9 4.0 - 10.5 K/uL   RBC 3.86 (L) 4.22 - 5.81 MIL/uL   Hemoglobin 13.3 13.0 - 17.0 g/dL   HCT 46.9 (L) 39 - 52 %   MCV 97.2 80.0 - 100.0 fL   MCH 34.5 (H) 26.0 - 34.0 pg   MCHC 35.5 30.0 - 36.0 g/dL   RDW 62.9 52.8 - 41.3 %   Platelets 308 150 - 400 K/uL   nRBC 0.0 0.0 - 0.2 %   Neutrophils Relative % 69 %   Neutro Abs 7.0 1.7 - 7.7 K/uL   Lymphocytes Relative 17 %   Lymphs Abs 1.7 0.7 - 4.0 K/uL   Monocytes Relative 10 %   Monocytes Absolute 1.0 0 - 1 K/uL   Eosinophils Relative 2 %   Eosinophils Absolute 0.2 0 - 0 K/uL   Basophils Relative 1 %   Basophils Absolute 0.1 0 - 0 K/uL   Immature Granulocytes 1 %   Abs Immature Granulocytes 0.07 0.00 - 0.07 K/uL    Comment: Performed at  [facility], [address], [place], [place] [address]  Comprehensive metabolic panel     Status: Abnormal   Collection Time: 03/15/20  3:31 PM  Result Value Ref Range   Sodium 132 (L) 135 - 145 mmol/L   Potassium 3.7 3.5 - 5.1 mmol/L   Chloride 97 (L) 98 - 111 mmol/L   CO2 24 22 - 32 mmol/L   Glucose, Bld 236 (H) 70 - 99 mg/dL    Comment: Glucose reference range applies only to samples taken after fasting for at least 8 hours.   BUN 17 6 - 20 mg/dL   Creatinine, Ser 9.14 0.61 - 1.24 mg/dL   Calcium 9.1 8.9 - 78.2 mg/dL   Total Protein 7.9 6.5 - 8.1 g/dL   Albumin 3.8 3.5 - 5.0 g/dL   AST 21 15 - 41 U/L   ALT 21 0 - 44 U/L   Alkaline Phosphatase 99 38 - 126 U/L   Total Bilirubin 0.7 0.3 - 1.2 mg/dL   GFR calc non Af Amer >60 >60 mL/min   GFR calc Af Amer >60 >60 mL/min   Anion gap 11 5 - 15     Comment: Performed at [facility], [address] [place], [place] [address]  Lactic acid, plasma     Status: None   Collection Time: 03/15/20  3:31 PM  Result Value Ref Range   Lactic Acid, Venous 1.3 0.5 - 1.9 mmol/L    Comment: Performed at [facility], [address] [place], [place] [address]  Sedimentation rate     Status: Abnormal   Collection Time: 03/15/20  3:31 PM  Result Value Ref Range   Sed Rate 95 (H) 0 - 20 mm/hr    Comment: Performed at [facility], [address], [place], [place] [address]  Respiratory Panel by RT PCR (Flu A&B, Covid) - Nasopharyngeal Swab     Status: None   Collection Time: 03/15/20  4:55 PM   Specimen: Nasopharyngeal Swab  Result Value Ref Range   SARS Coronavirus 2 by RT PCR NEGATIVE NEGATIVE    Comment: (NOTE) SARS-CoV-2 target nucleic acids are NOT DETECTED.  The SARS-CoV-2 RNA is generally detectable in upper respiratoy specimens during the acute phase of infection. The lowest concentration of SARS-CoV-2 viral copies this assay can detect is 131 copies/mL. A negative result does not preclude SARS-Cov-2 infection and should not be used as the sole basis for treatment or other patient management decisions. A negative result may occur with  improper specimen collection/handling, submission of specimen other than nasopharyngeal swab, presence of viral mutation(s) within the areas targeted by this assay, and inadequate number of viral copies (<131 copies/mL). A negative result must be combined with clinical observations, patient history, and epidemiological information. The expected result is Negative.  Fact Sheet for Patients:  https://www.moore.com/  Fact Sheet for Healthcare Providers:  https://www.young.biz/  This test is no t yet approved or cleared by the [place] FDA and  has been authorized for detection and/or diagnosis of SARS-CoV-2 by FDA under an  Emergency Use Authorization (EUA). This EUA will remain  in effect (meaning this test can be used) for the duration of the COVID-19 declaration under Section 564(b)(1) of the Act, 21 U.S.C. section 360bbb-3(b)(1), unless the authorization is terminated or revoked sooner.     Influenza A by PCR NEGATIVE NEGATIVE   Influenza B by PCR NEGATIVE NEGATIVE    Comment: (NOTE) The Xpert Xpress SARS-CoV-2/FLU/RSV assay is intended as  an aid in  the diagnosis of influenza from Nasopharyngeal swab specimens and  should not be used as a sole basis for treatment. Nasal washings and  aspirates are unacceptable for Xpert Xpress SARS-CoV-2/FLU/RSV  testing.  Fact Sheet for Patients: https://www.moore.com/https://www.fda.gov/media/142436/download  Fact Sheet for Healthcare Providers: https://www.young.biz/https://www.fda.gov/media/142435/download  This test is not yet approved or cleared by the [place] FDA and  has been authorized for detection and/or diagnosis of SARS-CoV-2 by  FDA under an Emergency Use Authorization (EUA). This EUA will remain  in effect (meaning this test can be used) for the duration of the  Covid-19 declaration under Section 564(b)(1) of the Act, 21  U.S.C. section 360bbb-3(b)(1), unless the authorization is  terminated or revoked. Performed at [facility], [address] [place], [place] [address]   Glucose, capillary     Status: Abnormal   Collection Time: 03/15/20  4:55 PM  Result Value Ref Range   Glucose-Capillary 209 (H) 70 - 99 mg/dL    Comment: Glucose reference range applies only to samples taken after fasting for at least 8 hours.    DG Chest Port 1 View  Result Date: 03/15/2020 CLINICAL DATA:  Cough. Wound infection. Diabetes. Hypertension. Smoker. EXAM: PORTABLE CHEST 1 VIEW COMPARISON:  06/17/2019 FINDINGS: Midline trachea. Normal heart size. Atherosclerosis in the transverse aorta. No pleural effusion or pneumothorax. Mild pulmonary interstitial thickening is likely related to  smoking/chronic bronchitis. The right suprahilar airspace disease has resolved since the prior radiograph. IMPRESSION: No acute cardiopulmonary disease. Aortic Atherosclerosis (ICD10-I70.0). Electronically Signed   By: Jeronimo GreavesKyle  Talbot M.D.   On: 03/15/2020 17:57   DG Foot Complete Left  Result Date: 03/15/2020 Please see detailed radiograph report in office note.   Review of Systems  Constitutional: Negative for chills and fever.  HENT: Negative for sinus pain and sore throat.   Respiratory: Negative for cough and shortness of breath.   Cardiovascular: Negative for chest pain and palpitations.  Gastrointestinal: Negative for nausea and vomiting.  Endocrine: Negative for polydipsia and polyuria.  Genitourinary: Negative for frequency and urgency.  Musculoskeletal: Negative for arthralgias and myalgias.  Skin:       Patient relates significant increase in swelling and redness in his left great toe as over the last day or 2.  Neurological:       Patient relates significant diabetic neuropathy in his feet.  Psychiatric/Behavioral: Negative for behavioral problems. The patient is not nervous/anxious.    Blood pressure (!) 161/92, pulse 86, temperature 98.4 F (36.9 C), temperature source Oral, resp. rate 16, height 5\' 10"  (1.778 m), weight 84.8 kg, SpO2 96 %. Physical Exam Cardiovascular:     Comments: DP pulses are palpable, 2/4 bilateral.  PT pulse is 2/4 on the right and 1/4 on the left but there is significant edema in the left foot. Musculoskeletal:     Comments: Adequate range of motion of the pedal joints.  Muscle testing deferred.  Some hammertoe contractures noted on the lesser toes.  Skin:    Comments: The skin is warm dry and supple.  Significant edema in the left foot with intense erythema in the hallux extending onto the dorsal forefoot.  Full-thickness ulceration is noted at the distal aspect of the hallux beneath the area where the nail plate was removed with some necrotic tissue  probing directly to bone.  Neurological:     Comments: Loss of protective threshold with a monofilament wire distally in the forefoot and toes bilateral.    X-rays: Multiple views  of the left foot previously taken today through the [facility] were reviewed today by myself which reveal osteolytic destruction with evidence for osteomyelitis in the distal aspect of the distal phalanx on the hallux.  Chart review: Notes from 02/17/2020 were reviewed which revealed his last hemoglobin A1c taken on 01/13/2020 was elevated at 9.3.   Assessment/Plan: Assessment: 1.  Osteomyelitis left hallux. 2.  Diabetes with associated neuropathy. 3.  Full-thickness ulceration left hallux with necrosis of bone.  Plan: Patient is currently scheduled for an MRI of the left foot for evaluation of the extent of infection.  Discussed with the patient that he will most likely need amputation of the left hallux.  We will wait to fully determine the level of amputation pending his MRI.  Betadine gauze and a sterile dressing applied to the left hallux and forefoot.  I will plan on following up with the patient tomorrow and determine timing for surgery for amputation of the left hallux.  Ricci Barker 03/15/2020, 6:50 PM

## 2020-03-15 NOTE — H&P (View-Only) (Signed)
Pharmacy Antibiotic Note  Timothy [name]. is a 59 y.o. male with medical history including diabetes, tobacco abuse admitted on 03/15/2020 with left hallux infection. Imaging is pending but there is concern for osteomyelitis. Pharmacy has been consulted for vancomycin dosing. Patient is also ordered meropenem given history of penicillin allergy.  Plan:  Vancomycin 2 g (1 g + 1 g) IV LD x 1   Followed by maintenance regimen of vancomycin 1 g IV q8h per nomogram  --Goal trough 15 - 20 mcg/mL given concern for osteomyelitis --Daily Scr per protocol --Narrow antibiotics as appropriate when cultures become available  Height: 5\' 10"  (177.8 cm) Weight: 84.8 kg (187 lb) IBW/kg (Calculated) : 73  Temp (24hrs), Avg:98.4 F (36.9 C), Min:98.3 F (36.8 C), Max:98.4 F (36.9 C)  Recent Labs  Lab 03/15/20 1531  WBC 9.9  CREATININE 0.77  LATICACIDVEN 1.3    Estimated Creatinine Clearance: 103.9 mL/min (by C-G formula based on SCr of 0.77 mg/dL).    Allergies  Allergen Reactions  . Bee Venom Anaphylaxis  . Coffee Flavor Shortness Of Breath  . Cheese Swelling    Blue Cheese only  . Penicillins Other (See Comments)    unknown    Antimicrobials this admission: Vancomycin 9/28 >>  Meropenem 9/28 >>   Dose adjustments this admission: N/A  Microbiology results:  Thank you for allowing pharmacy to be a part of this patient's care.  10/28 03/15/2020 5:19 PM

## 2020-03-16 ENCOUNTER — Inpatient Hospital Stay: Payer: Self-pay | Admitting: Certified Registered"

## 2020-03-16 ENCOUNTER — Encounter
Admission: EM | Disposition: A | Discharge status: Home / Self Care | Payer: Self-pay | Source: Home / Self Care | Attending: Internal Medicine

## 2020-03-16 ENCOUNTER — Encounter: Payer: Self-pay | Admitting: Internal Medicine

## 2020-03-16 DIAGNOSIS — F17200 Nicotine dependence, unspecified, uncomplicated: Secondary | ICD-10-CM

## 2020-03-16 DIAGNOSIS — E1140 Type 2 diabetes mellitus with diabetic neuropathy, unspecified: Secondary | ICD-10-CM

## 2020-03-16 DIAGNOSIS — I10 Essential (primary) hypertension: Secondary | ICD-10-CM

## 2020-03-16 HISTORY — PX: AMPUTATION TOE: SHX6595

## 2020-03-16 LAB — GLUCOSE, CAPILLARY
Glucose-Capillary: 263 mg/dL — ABNORMAL HIGH (ref 70–99)
Glucose-Capillary: 217 mg/dL — ABNORMAL HIGH (ref 70–99)
Glucose-Capillary: 137 mg/dL — ABNORMAL HIGH (ref 70–99)
Glucose-Capillary: 147 mg/dL — ABNORMAL HIGH (ref 70–99)
Glucose-Capillary: 144 mg/dL — ABNORMAL HIGH (ref 70–99)

## 2020-03-16 LAB — COMPREHENSIVE METABOLIC PANEL
ALT: 18 U/L (ref 0–44)
AST: 17 U/L (ref 15–41)
Albumin: 3.4 g/dL — ABNORMAL LOW (ref 3.5–5.0)
Alkaline Phosphatase: 92 U/L (ref 38–126)
Anion gap: 12 (ref 5–15)
BUN: 13 mg/dL (ref 6–20)
CO2: 26 mmol/L (ref 22–32)
Calcium: 8.9 mg/dL (ref 8.9–10.3)
Chloride: 95 mmol/L — ABNORMAL LOW (ref 98–111)
Creatinine, Ser: 0.79 mg/dL (ref 0.61–1.24)
GFR calc Af Amer: >60 mL/min (ref >60)
GFR calc non Af Amer: >60 mL/min (ref >60)
Glucose, Bld: 225 mg/dL — ABNORMAL HIGH (ref 70–99)
Potassium: 3.5 mmol/L (ref 3.5–5.1)
Sodium: 133 mmol/L — ABNORMAL LOW (ref 135–145)
Total Bilirubin: 0.7 mg/dL (ref 0.3–1.2)
Total Protein: 7.4 g/dL (ref 6.5–8.1)

## 2020-03-16 LAB — CBC
HCT: 36.2 % — ABNORMAL LOW (ref 39.0–52.0)
Hemoglobin: 12.8 g/dL — ABNORMAL LOW (ref 13.0–17.0)
MCH: 34.1 pg — ABNORMAL HIGH (ref 26.0–34.0)
MCHC: 35.4 g/dL (ref 30.0–36.0)
MCV: 96.5 fL (ref 80.0–100.0)
Platelets: 273 10*3/uL (ref 150–400)
RBC: 3.75 MIL/uL — ABNORMAL LOW (ref 4.22–5.81)
RDW: 12.5 % (ref 11.5–15.5)
WBC: 8 10*3/uL (ref 4.0–10.5)
nRBC: 0 % (ref 0.0–0.2)

## 2020-03-16 LAB — PROTIME-INR
INR: 0.9 (ref 0.8–1.2)
Prothrombin Time: 12.2 seconds (ref 11.4–15.2)

## 2020-03-16 LAB — HEMOGLOBIN A1C
Hgb A1c MFr Bld: 8.5 % — ABNORMAL HIGH (ref 4.8–5.6)
Mean Plasma Glucose: 197.25 mg/dL

## 2020-03-16 LAB — HIV ANTIBODY (ROUTINE TESTING W REFLEX): HIV Screen 4th Generation wRfx: NONREACTIVE

## 2020-03-16 SURGERY — AMPUTATION, TOE
Anesthesia: General | Site: Toe | Laterality: Left

## 2020-03-16 MED ORDER — LIDOCAINE HCL (CARDIAC) PF 100 MG/5ML IV SOSY
PREFILLED_SYRINGE | INTRAVENOUS | Status: DC | PRN
  Administered 2020-03-16: 80 mg via INTRAVENOUS

## 2020-03-16 MED ORDER — ACETAMINOPHEN 10 MG/ML IV SOLN
INTRAVENOUS | Status: AC
  Filled 2020-03-16: qty 100

## 2020-03-16 MED ORDER — MIDAZOLAM HCL 2 MG/2ML IJ SOLN
INTRAMUSCULAR | Status: DC | PRN
  Administered 2020-03-16: 2 mg via INTRAVENOUS

## 2020-03-16 MED ORDER — ONDANSETRON HCL 4 MG/2ML IJ SOLN: 4.0000 mg | Freq: Once | INTRAMUSCULAR | Status: DC | PRN

## 2020-03-16 MED ORDER — BUPIVACAINE HCL 0.5 % IJ SOLN
INTRAMUSCULAR | Status: DC | PRN
  Administered 2020-03-16: 9 mL

## 2020-03-16 MED ORDER — PROPOFOL 10 MG/ML IV BOLUS
INTRAVENOUS | Status: DC | PRN
  Administered 2020-03-16: 180 mg via INTRAVENOUS

## 2020-03-16 MED ORDER — MUPIROCIN 2 % EX OINT
1.0000 "application " | TOPICAL_OINTMENT | Freq: Two times a day (BID) | CUTANEOUS | Status: AC
  Administered 2020-03-16 – 2020-03-20 (×9): 1 via NASAL
  Filled 2020-03-16: qty 22

## 2020-03-16 MED ORDER — SODIUM CHLORIDE FLUSH 0.9 % IV SOLN
INTRAVENOUS | Status: AC
  Filled 2020-03-16: qty 10

## 2020-03-16 MED ORDER — FENTANYL CITRATE (PF) 100 MCG/2ML IJ SOLN
INTRAMUSCULAR | Status: AC
  Filled 2020-03-16: qty 2

## 2020-03-16 MED ORDER — MIDAZOLAM HCL 2 MG/2ML IJ SOLN
INTRAMUSCULAR | Status: AC
  Filled 2020-03-16: qty 2

## 2020-03-16 MED ORDER — PHENYLEPHRINE HCL (PRESSORS) 10 MG/ML IV SOLN
INTRAVENOUS | Status: AC
  Filled 2020-03-16: qty 1

## 2020-03-16 MED ORDER — VANCOMYCIN HCL 1250 MG/250ML IV SOLN
1250.0000 mg | Freq: Two times a day (BID) | INTRAVENOUS | Status: DC
  Administered 2020-03-16 – 2020-03-17 (×2): 1250 mg via INTRAVENOUS
  Filled 2020-03-16 (×5): qty 250

## 2020-03-16 MED ORDER — LIVING WELL WITH DIABETES BOOK
Freq: Once | Status: DC
  Filled 2020-03-16: qty 1

## 2020-03-16 MED ORDER — DEXMEDETOMIDINE (PRECEDEX) IN NS 20 MCG/5ML (4 MCG/ML) IV SYRINGE
PREFILLED_SYRINGE | INTRAVENOUS | Status: AC
  Filled 2020-03-16: qty 5

## 2020-03-16 MED ORDER — ONDANSETRON HCL 4 MG/2ML IJ SOLN
INTRAMUSCULAR | Status: AC
  Filled 2020-03-16: qty 2

## 2020-03-16 MED ORDER — FENTANYL CITRATE (PF) 100 MCG/2ML IJ SOLN: 25.0000 ug | INTRAMUSCULAR | Status: DC | PRN

## 2020-03-16 MED ORDER — FENTANYL CITRATE (PF) 100 MCG/2ML IJ SOLN
INTRAMUSCULAR | Status: DC | PRN
Start: 2020-03-16 — End: 2020-03-16
  Administered 2020-03-16: 50 ug via INTRAVENOUS

## 2020-03-16 MED ORDER — ACETAMINOPHEN 10 MG/ML IV SOLN
INTRAVENOUS | Status: DC | PRN
  Administered 2020-03-16: 1000 mg via INTRAVENOUS

## 2020-03-16 MED ORDER — PROPOFOL 10 MG/ML IV BOLUS
INTRAVENOUS | Status: AC
  Filled 2020-03-16: qty 20

## 2020-03-16 SURGICAL SUPPLY — 48 items
BLADE MED AGGRESSIVE (BLADE) ×5 IMPLANT
BLADE OSC/SAGITTAL MD 5.5X18 (BLADE) ×3 IMPLANT
BLADE SURG 15 STRL LF DISP TIS (BLADE) ×2 IMPLANT
BLADE SURG 15 STRL SS (BLADE) ×4
BLADE SURG MINI STRL (BLADE) ×3 IMPLANT
BNDG CONFORM 2 STRL LF (GAUZE/BANDAGES/DRESSINGS) ×3 IMPLANT
BNDG ELASTIC 4X5.8 VLCR STR LF (GAUZE/BANDAGES/DRESSINGS) ×3 IMPLANT
BNDG ESMARK 4X12 TAN STRL LF (GAUZE/BANDAGES/DRESSINGS) ×3 IMPLANT
BNDG GAUZE 4.5X4.1 6PLY STRL (MISCELLANEOUS) ×5 IMPLANT
CANISTER SUCT 1200ML W/VALVE (MISCELLANEOUS) ×3 IMPLANT
CLOSURE WOUND 1/4X4 (GAUZE/BANDAGES/DRESSINGS) ×1
COVER WAND RF STERILE (DRAPES) ×3 IMPLANT
CUFF TOURN 18 STER (MISCELLANEOUS) ×1 IMPLANT
CUFF TOURN DUAL PL 12 NO SLV (MISCELLANEOUS) ×3 IMPLANT
CUFF TOURN SGL QUICK 18X4 (TOURNIQUET CUFF) ×2 IMPLANT
DRAPE FLUOR MINI C-ARM 54X84 (DRAPES) ×1 IMPLANT
DURAPREP 26ML APPLICATOR (WOUND CARE) ×3 IMPLANT
ELECT REM PT RETURN 9FT ADLT (ELECTROSURGICAL) ×3
ELECTRODE REM PT RTRN 9FT ADLT (ELECTROSURGICAL) ×1 IMPLANT
GAUZE PETROLATUM 1 X8 (GAUZE/BANDAGES/DRESSINGS) ×2 IMPLANT
GAUZE SPONGE 4X4 12PLY STRL (GAUZE/BANDAGES/DRESSINGS) ×3 IMPLANT
GAUZE XEROFORM 1X8 LF (GAUZE/BANDAGES/DRESSINGS) ×3 IMPLANT
GLOVE BIO SURGEON STRL SZ7.5 (GLOVE) ×3 IMPLANT
GLOVE INDICATOR 8.0 STRL GRN (GLOVE) ×3 IMPLANT
GOWN STRL REUS W/ TWL LRG LVL3 (GOWN DISPOSABLE) ×2 IMPLANT
GOWN STRL REUS W/TWL LRG LVL3 (GOWN DISPOSABLE) ×4
HANDPIECE VERSAJET DEBRIDEMENT (MISCELLANEOUS) ×5 IMPLANT
KIT TURNOVER KIT A (KITS) ×3 IMPLANT
LABEL OR SOLS (LABEL) ×3 IMPLANT
NDL FILTER BLUNT 18X1 1/2 (NEEDLE) ×1 IMPLANT
NDL HYPO 25X1 1.5 SAFETY (NEEDLE) ×2 IMPLANT
NEEDLE FILTER BLUNT 18X 1/2SAF (NEEDLE)
NEEDLE FILTER BLUNT 18X1 1/2 (NEEDLE) IMPLANT
NEEDLE HYPO 25X1 1.5 SAFETY (NEEDLE) ×6 IMPLANT
NS IRRIG 500ML POUR BTL (IV SOLUTION) ×3 IMPLANT
PACK EXTREMITY (MISCELLANEOUS) ×3 IMPLANT
SOL PREP PVP 2OZ (MISCELLANEOUS) ×3
SOLUTION PREP PVP 2OZ (MISCELLANEOUS) ×1 IMPLANT
STOCKINETTE BIAS CUT 4 980044 (GAUZE/BANDAGES/DRESSINGS) ×3 IMPLANT
STOCKINETTE STRL 6IN 960660 (GAUZE/BANDAGES/DRESSINGS) ×3 IMPLANT
STRIP CLOSURE SKIN 1/4X4 (GAUZE/BANDAGES/DRESSINGS) ×2 IMPLANT
SUT ETHILON 3-0 FS-10 30 BLK (SUTURE) ×3
SUT ETHILON 4-0 (SUTURE)
SUT ETHILON 4-0 FS2 18XMFL BLK (SUTURE)
SUTURE EHLN 3-0 FS-10 30 BLK (SUTURE) ×1 IMPLANT
SUTURE ETHLN 4-0 FS2 18XMF BLK (SUTURE) IMPLANT
SWAB DUAL CULTURE TRANS RED ST (MISCELLANEOUS) ×1 IMPLANT
SYR 10ML LL (SYRINGE) ×3 IMPLANT

## 2020-03-16 NOTE — ED Notes (Signed)
Second attempt to call report.

## 2020-03-16 NOTE — ED Notes (Addendum)
Attempt to call report, unable to get a nurse to come to the line to receive report

## 2020-03-16 NOTE — ED Notes (Addendum)
ED TO INPATIENT HANDOFF REPORT  ED Nurse Name and Phone #: Orpha Bur [phone]  S Name/Age/Gender Timothy [name]. 59 y.o. male Room/Bed: ED38A/ED38A  Code Status   Code Status: Full Code  Home/SNF/Other Home Patient oriented to: self, place, time and situation Is this baseline? Yes   Triage Complete: Triage complete  Chief Complaint Osteomyelitis [facility]) [M86.9]  Triage Note Pt here for foot infection.  [facility] for possible [facility]o and IV abx.  Toe nail was removed at foot and ankle doctor.  Redness and purple discoloration to left great toe and moving up foot.  Warm to touch. Xray done outpatient, called radiology to see if can get read done as STAT since  ED pt.    Allergies Allergies  Allergen Reactions   Bee Venom Anaphylaxis   Coffee Flavor Shortness Of Breath   Cheese Swelling    Blue Cheese only   Penicillins Other (See Comments)    unknown    Level of Care/Admitting Diagnosis ED Disposition    ED Disposition Condition Comment   Admit  Hospital Area: [facility] [[id number]]  Level of Care: Med-Surg [16]  Covid Evaluation: Asymptomatic Screening Protocol (No Symptoms)  Diagnosis: Osteomyelitis [facility]) [[id number]]  Admitting Physician: Tresa Moore [[id number]]  Attending Physician: Tresa Moore [[id number]]  Estimated length of stay: past midnight tomorrow  Certification:: I certify this patient will need inpatient services for at least 2 midnights       B Medical/Surgery History Past Medical History:  Diagnosis Date   Diabetes mellitus without complication (HCC)    Hypercholesteremia    Hypertension    Past Surgical History:  Procedure Laterality Date   COLONOSCOPY WITH PROPOFOL N/A 12/22/2019   Procedure: COLONOSCOPY WITH PROPOFOL;  Surgeon: Midge Minium, MD;  Location: [facility] ENDOSCOPY;  Service: Endoscopy;  Laterality: N/A;   TONSILLECTOMY     WISDOM TOOTH EXTRACTION       A IV  Location/Drains/Wounds Patient Lines/Drains/Airways Status    Active Line/Drains/Airways    Name Placement date Placement time Site Days   Peripheral IV 03/15/20 Right Antecubital 03/15/20  1645  Antecubital  1   Airway 12/22/19  0753   85          Intake/Output Last 24 hours  Intake/Output Summary (Last 24 hours) at 03/16/2020 0008 Last data filed at 03/15/2020 2208 Gross per 24 hour  Intake 600 ml  Output --  Net 600 ml    Labs/Imaging Results for orders placed or performed during the hospital encounter of 03/15/20 (from the past 48 hour(s))  CBC with Differential     Status: Abnormal   Collection Time: 03/15/20  3:31 PM  Result Value Ref Range   WBC 9.9 4.0 - 10.5 K/uL   RBC 3.86 (L) 4.22 - 5.81 MIL/uL   Hemoglobin 13.3 13.0 - 17.0 g/dL   HCT 26.8 (L) 39 - 52 %   MCV 97.2 80.0 - 100.0 fL   MCH 34.5 (H) 26.0 - 34.0 pg   MCHC 35.5 30.0 - 36.0 g/dL   RDW 34.1 96.2 - 22.9 %   Platelets 308 150 - 400 K/uL   nRBC 0.0 0.0 - 0.2 %   Neutrophils Relative % 69 %   Neutro Abs 7.0 1.7 - 7.7 K/uL   Lymphocytes Relative 17 %   Lymphs Abs 1.7 0.7 - 4.0 K/uL   Monocytes Relative 10 %   Monocytes Absolute 1.0 0 - 1 K/uL   Eosinophils  Relative 2 %   Eosinophils Absolute 0.2 0 - 0 K/uL   Basophils Relative 1 %   Basophils Absolute 0.1 0 - 0 K/uL   Immature Granulocytes 1 %   Abs Immature Granulocytes 0.07 0.00 - 0.07 K/uL    Comment: Performed at [facility], [address] [place], [place] [address]  Comprehensive metabolic panel     Status: Abnormal   Collection Time: 03/15/20  3:31 PM  Result Value Ref Range   Sodium 132 (L) 135 - 145 mmol/L   Potassium 3.7 3.5 - 5.1 mmol/L   Chloride 97 (L) 98 - 111 mmol/L   CO2 24 22 - 32 mmol/L   Glucose, Bld 236 (H) 70 - 99 mg/dL    Comment: Glucose reference range applies only to samples taken after fasting for at least 8 hours.   BUN 17 6 - 20 mg/dL   Creatinine, Ser 6.040.77 0.61 - 1.24 mg/dL   Calcium 9.1 8.9 - 54.010.3 mg/dL    Total Protein 7.9 6.5 - 8.1 g/dL   Albumin 3.8 3.5 - 5.0 g/dL   AST 21 15 - 41 U/L   ALT 21 0 - 44 U/L   Alkaline Phosphatase 99 38 - 126 U/L   Total Bilirubin 0.7 0.3 - 1.2 mg/dL   GFR calc non Af Amer >60 >60 mL/min   GFR calc Af Amer >60 >60 mL/min   Anion gap 11 5 - 15    Comment: Performed at [facility], [address] [place], [place] [address]  Lactic acid, plasma     Status: None   Collection Time: 03/15/20  3:31 PM  Result Value Ref Range   Lactic Acid, Venous 1.3 0.5 - 1.9 mmol/L    Comment: Performed at [facility], [address] [place], [place] [address]  Sedimentation rate     Status: Abnormal   Collection Time: 03/15/20  3:31 PM  Result Value Ref Range   Sed Rate 95 (H) 0 - 20 mm/hr    Comment: Performed at [facility], [address] [place], [place] [address]  Respiratory Panel by RT PCR (Flu A&B, Covid) - Nasopharyngeal Swab     Status: None   Collection Time: 03/15/20  4:55 PM   Specimen: Nasopharyngeal Swab  Result Value Ref Range   SARS Coronavirus 2 by RT PCR NEGATIVE NEGATIVE    Comment: (NOTE) SARS-CoV-2 target nucleic acids are NOT DETECTED.  The SARS-CoV-2 RNA is generally detectable in upper respiratoy specimens during the acute phase of infection. The lowest concentration of SARS-CoV-2 viral copies this assay can detect is 131 copies/mL. A negative result does not preclude SARS-Cov-2 infection and should not be used as the sole basis for treatment or other patient management decisions. A negative result may occur with  improper specimen collection/handling, submission of specimen other than nasopharyngeal swab, presence of viral mutation(s) within the areas targeted by this assay, and inadequate number of viral copies (<131 copies/mL). A negative result must be combined with clinical observations, patient history, and epidemiological information. The expected result is Negative.  Fact Sheet for Patients:   https://www.moore.com/https://www.fda.gov/media/142436/download  Fact Sheet for Healthcare Providers:  https://www.young.biz/https://www.fda.gov/media/142435/download  This test is no t yet approved or cleared by the [place] FDA and  has been authorized for detection and/or diagnosis of SARS-CoV-2 by FDA under an Emergency Use Authorization (EUA). This EUA will remain  in effect (meaning this test can be used) for the duration of the COVID-19 declaration  under Section 564(b)(1) of the Act, 21 U.S.C. section 360bbb-3(b)(1), unless the authorization is terminated or revoked sooner.     Influenza A by PCR NEGATIVE NEGATIVE   Influenza B by PCR NEGATIVE NEGATIVE    Comment: (NOTE) The Xpert Xpress SARS-CoV-2/FLU/RSV assay is intended as an aid in  the diagnosis of influenza from Nasopharyngeal swab specimens and  should not be used as a sole basis for treatment. Nasal washings and  aspirates are unacceptable for Xpert Xpress SARS-CoV-2/FLU/RSV  testing.  Fact Sheet for Patients: https://www.moore.com/  Fact Sheet for Healthcare Providers: https://www.young.biz/  This test is not yet approved or cleared by the [place] FDA and  has been authorized for detection and/or diagnosis of SARS-CoV-2 by  FDA under an Emergency Use Authorization (EUA). This EUA will remain  in effect (meaning this test can be used) for the duration of the  Covid-19 declaration under Section 564(b)(1) of the Act, 21  U.S.C. section 360bbb-3(b)(1), unless the authorization is  terminated or revoked. Performed at [facility], [address] [place], [place] [address]   Glucose, capillary     Status: Abnormal   Collection Time: 03/15/20  4:55 PM  Result Value Ref Range   Glucose-Capillary 209 (H) 70 - 99 mg/dL    Comment: Glucose reference range applies only to samples taken after fasting for at least 8 hours.  Glucose, capillary     Status: Abnormal   Collection Time: 03/15/20  9:20 PM   Result Value Ref Range   Glucose-Capillary 190 (H) 70 - 99 mg/dL    Comment: Glucose reference range applies only to samples taken after fasting for at least 8 hours.   DG Chest Port 1 View  Result Date: 03/15/2020 CLINICAL DATA:  Cough. Wound infection. Diabetes. Hypertension. Smoker. EXAM: PORTABLE CHEST 1 VIEW COMPARISON:  06/17/2019 FINDINGS: Midline trachea. Normal heart size. Atherosclerosis in the transverse aorta. No pleural effusion or pneumothorax. Mild pulmonary interstitial thickening is likely related to smoking/chronic bronchitis. The right suprahilar airspace disease has resolved since the prior radiograph. IMPRESSION: No acute cardiopulmonary disease. Aortic Atherosclerosis (ICD10-I70.0). Electronically Signed   By: Jeronimo Greaves M.D.   On: 03/15/2020 17:57   DG Foot Complete Left  Result Date: 03/15/2020 Please see detailed radiograph report in office note.   Pending Labs Unresulted Labs (From admission, onward)          Start     Ordered   03/22/20 0500  Creatinine, serum  (enoxaparin (LOVENOX)    CrCl >/= 30 ml/min)  Weekly,   STAT     Comments: while on enoxaparin therapy    03/15/20 1650   03/16/20 0500  Comprehensive metabolic panel  Tomorrow morning,   STAT        03/15/20 1650   03/16/20 0500  CBC  Tomorrow morning,   STAT        03/15/20 1650   03/16/20 0500  Protime-INR  Tomorrow morning,   STAT        03/15/20 1650   03/16/20 0500  HIV Antibody (routine testing w rflx)  Once,   STAT        03/16/20 0500   03/15/20 1703  Urinalysis, Complete w Microscopic  Once,   STAT        03/15/20 1702   03/15/20 1651  Hemoglobin A1c  Once,   STAT       Comments: To assess prior glycemic control    03/15/20 1651  Vitals/Pain Today's Vitals   03/15/20 1831 03/15/20 2124 03/15/20 2200 03/15/20 2310  BP: (!) 161/92 140/85 130/87   Pulse:  85    Resp:  15    Temp:      TempSrc:      SpO2:  96%    Weight:      Height:      PainSc:    0-No pain     Isolation Precautions No active isolations  Medications Medications  meropenem (MERREM) 1 g in sodium chloride 0.9 % 100 mL IVPB (0 g Intravenous Stopped 03/15/20 2208)  amLODipine (NORVASC) tablet 5 mg (5 mg Oral Given 03/15/20 1831)  cloNIDine (CATAPRES) tablet 0.1 mg (0.1 mg Oral Given 03/15/20 2114)  rosuvastatin (CRESTOR) tablet 5 mg (5 mg Oral Given 03/15/20 1834)  citalopram (CELEXA) tablet 20 mg (20 mg Oral Given 03/15/20 1831)  pantoprazole (PROTONIX) EC tablet 40 mg (40 mg Oral Given 03/15/20 1831)  gabapentin (NEURONTIN) capsule 300 mg (300 mg Oral Given 03/15/20 2114)  mometasone-formoterol (DULERA) 200-5 MCG/ACT inhaler 2 puff (0 puffs Inhalation Hold 03/15/20 2001)  enoxaparin (LOVENOX) injection 40 mg (40 mg Subcutaneous Given 03/15/20 2117)  0.9 %  sodium chloride infusion (has no administration in time range)  acetaminophen (TYLENOL) tablet 650 mg (has no administration in time range)    Or  acetaminophen (TYLENOL) suppository 650 mg (has no administration in time range)  oxyCODONE (Oxy IR/ROXICODONE) immediate release tablet 5 mg (has no administration in time range)  morphine 2 MG/ML injection 2 mg (has no administration in time range)  traZODone (DESYREL) tablet 25 mg (has no administration in time range)  senna-docusate (Senokot-S) tablet 1 tablet (has no administration in time range)  ondansetron (ZOFRAN) tablet 4 mg (has no administration in time range)    Or  ondansetron (ZOFRAN) injection 4 mg (has no administration in time range)  albuterol (PROVENTIL) (2.5 MG/3ML) 0.083% nebulizer solution 2.5 mg (has no administration in time range)  nicotine (NICODERM CQ - dosed in mg/24 hours) patch 21 mg (21 mg Transdermal Patch Applied 03/15/20 1834)  hydrALAZINE (APRESOLINE) injection 10 mg (has no administration in time range)  insulin aspart (novoLOG) injection 0-15 Units (5 Units Subcutaneous Given 03/15/20 1835)  insulin aspart (novoLOG) injection 0-5 Units (0 Units  Subcutaneous Not Given 03/15/20 2121)  vancomycin (VANCOCIN) IVPB 1000 mg/200 mL premix (has no administration in time range)  vancomycin (VANCOCIN) IVPB 1000 mg/200 mL premix (0 mg Intravenous Stopped 03/15/20 1823)  vancomycin (VANCOCIN) IVPB 1000 mg/200 mL premix (0 mg Intravenous Stopped 03/15/20 2103)  gadobutrol (GADAVIST) 1 MMOL/ML injection 8 mL (8 mLs Intravenous Contrast Given 03/15/20 1950)    Mobility walks Low fall risk   Focused Assessments Pulmonary Assessment Handoff:  Lung sounds:   O2 Device: Room Air        R Recommendations: See Admitting Provider Note  Report given to:   Additional Notes: AOx4 walkie talkie, NPO after midnight. (L) hallux amputation tomorrow

## 2020-03-16 NOTE — Anesthesia Preprocedure Evaluation (Signed)
Anesthesia Evaluation  Patient identified by MRN, date of birth, ID band Patient awake    Reviewed: Allergy & Precautions, NPO status , Patient's Chart, lab work & pertinent test results  History of Anesthesia Complications Negative for: history of anesthetic complications  Airway Mallampati: III       Dental  (+) Poor Dentition, Chipped   Pulmonary neg sleep apnea, COPD, Current Smoker,           Cardiovascular hypertension, Pt. on medications (-) Past MI and (-) CHF (-) dysrhythmias (-) Valvular Problems/Murmurs     Neuro/Psych neg Seizures Anxiety    GI/Hepatic Neg liver ROS, GERD  Medicated and Controlled,  Endo/Other  diabetes, Type 2, Oral Hypoglycemic Agents  Renal/GU negative Renal ROS     Musculoskeletal   Abdominal   Peds  Hematology   Anesthesia Other Findings   Reproductive/Obstetrics                             Anesthesia Physical Anesthesia Plan  ASA: III and emergent  Anesthesia Plan: General   Post-op Pain Management:    Induction:   PONV Risk Score and Plan: 1 and Ondansetron  Airway Management Planned: LMA  Additional Equipment:   Intra-op Plan:   Post-operative Plan:   Informed Consent: I have reviewed the patients History and Physical, chart, labs and discussed the procedure including the risks, benefits and alternatives for the proposed anesthesia with the patient or authorized representative who has indicated his/her understanding and acceptance.       Plan Discussed with:   Anesthesia Plan Comments:         Anesthesia Quick Evaluation

## 2020-03-16 NOTE — Consult Note (Signed)
Pharmacy Antibiotic Note  Timothy [name]. is a 59 y.o. male with medical history including diabetes, tobacco abuse admitted on 03/15/2020 with left hallux infection. Imaging reveals osteomyelitis of R great toe. Pharmacy has been consulted for vancomycin dosing  PCN allergy listed but has taken cephalexin without issue.    Plan:  Based on patient's age, will avoid q8h dosing.  Change to vancomycin 1250mg  IV q12h for estimated trough = 14.5 mcg/mL --Goal trough 15 - 20 mcg/mL given concern for osteomyelitis --Monitor SCr --Narrow antibiotics as appropriate when cultures become available  Height: 5\' 10"  (177.8 cm) Weight: 84.8 kg (187 lb) IBW/kg (Calculated) : 73  Temp (24hrs), Avg:98.7 F (37.1 C), Min:98.3 F (36.8 C), Max:99.3 F (37.4 C)  Recent Labs  Lab 03/15/20 1531 03/16/20 0555  WBC 9.9 8.0  CREATININE 0.77 0.79  LATICACIDVEN 1.3  --     Estimated Creatinine Clearance: 103.9 mL/min (by C-G formula based on SCr of 0.79 mg/dL).    Allergies  Allergen Reactions  . Bee Venom Anaphylaxis  . Coffee Flavor Shortness Of Breath  . Cheese Swelling    Blue Cheese only  . Penicillins Other (See Comments)    Unknown - remote reaction; "preteen". D/w patient recent use of Keflex and he does not recall having a reaction.    Antimicrobials this admission: Vancomycin 9/28 >>  Meropenem 9/28 >>   Dose adjustments this admission: N/A  Microbiology results:  Thank you for allowing pharmacy to be a part of this patient's care.  10/28, PharmD, BCPS.   Work Cell: [phone] 03/16/2020 1:14 PM

## 2020-03-16 NOTE — Anesthesia Postprocedure Evaluation (Signed)
Anesthesia Post Note  Patient: Timothy [name].  Procedure(s) Performed: AMPUTATION GREAT TOE (Left Toe)  Patient location during evaluation: PACU Anesthesia Type: General Level of consciousness: awake and alert Pain management: pain level controlled Vital Signs Assessment: post-procedure vital signs reviewed and stable Respiratory status: spontaneous breathing and respiratory function stable Cardiovascular status: stable Anesthetic complications: no   No complications documented.   Last Vitals:  Vitals:   03/16/20 0853 03/16/20 1259  BP: 138/80 124/73  Pulse: 72 70  Resp: 16 18  Temp: 37.1 C 37.1 C  SpO2: 96% 98%    Last Pain:  Vitals:   03/16/20 1259  TempSrc: Oral  PainSc:                  Timothy [name]

## 2020-03-16 NOTE — Progress Notes (Signed)
PROGRESS NOTE    Timothy SillWarren B Alfieri Jr.  ZOX:[id number] DOB: 02/25/1961 DOA: 03/15/2020 PCP: Rolm GalaIloabachie, Chioma E, NP    Brief Narrative:  Timothy SillWarren B [place] Jr. is a 59 y.o. male with medical history significant of hypertension, type 2 diabetes mellitus, hypercholesterolemia, tobacco abuse, depression/anxiety who presents to the ED for evaluation of a infection and his left hallux. Podiatry was consulted. MRI completed.     Consultants:   Podiatry  Procedures:  MRI 1. Acute osteomyelitis of the distal tuft of the left great toe distal phalanx. 2. Fluid and scattered foci of susceptibility within the subungual region of the great toe. Additional fluid collection along the plantar surface of the great toe distal phalanx measuring 1.4 x 0.9 x 1.6 cm, which may represent a small abscesses or phlegmonous change. 3. Minimal subchondral bone marrow edema within the great toe proximal phalanx adjacent to the IP joint with preservation of the fatty T1 bone marrow signal. Findings may be reactive. Early osteomyelitis not excluded. 4. Edema-like signal throughout the intrinsic foot musculature suggesting a nonspecific myositis  Antimicrobials:    vancomycin   Subjective: Has no pain, and no complaints. Denies fever, chills.  Objective: Vitals:   03/16/20 0105 03/16/20 0513 03/16/20 0853 03/16/20 1259  BP: 139/76 (!) 151/80 138/80 124/73  Pulse: 79 74 72 70  Resp: 16 18 16 18   Temp: 99.3 F (37.4 C) 98.9 F (37.2 C) 98.8 F (37.1 C) 98.7 F (37.1 C)  TempSrc: Oral Oral Oral Oral  SpO2: 93% 93% 96% 98%  Weight:      Height:        Intake/Output Summary (Last 24 hours) at 03/16/2020 1708 Last data filed at 03/16/2020 0640 Gross per 24 hour  Intake 600 ml  Output 525 ml  Net 75 ml   Filed Weights   03/15/20 1519  Weight: 84.8 kg    Examination:  General exam: Appears calm and comfortable  Respiratory system: Clear to auscultation. Respiratory effort  normal. Cardiovascular system: S1 & S2 heard, RRR. No JVD, murmurs, rubs, gallops or clicks.  Gastrointestinal system: Abdomen is nondistended, soft and nontender.  Normal bowel sounds heard. Central nervous system: Alert and oriented. No focal neurological deficits. Extremities: LLE edema, foot wrapped in dressing, did not remove Skin: warm, dry Psychiatry: Judgement and insight appear normal. Mood & affect appropriate.     Data Reviewed: I have personally reviewed following labs and imaging studies  CBC: Recent Labs  Lab 03/15/20 1531 03/16/20 0555  WBC 9.9 8.0  NEUTROABS 7.0  --   HGB 13.3 12.8*  HCT 37.5* 36.2*  MCV 97.2 96.5  PLT 308 273   Basic Metabolic Panel: Recent Labs  Lab 03/15/20 1531 03/16/20 0555  NA 132* 133*  K 3.7 3.5  CL 97* 95*  CO2 24 26  GLUCOSE 236* 225*  BUN 17 13  CREATININE 0.77 0.79  CALCIUM 9.1 8.9   GFR: Estimated Creatinine Clearance: 103.9 mL/min (by C-G formula based on SCr of 0.79 mg/dL). Liver Function Tests: Recent Labs  Lab 03/15/20 1531 03/16/20 0555  AST 21 17  ALT 21 18  ALKPHOS 99 92  BILITOT 0.7 0.7  PROT 7.9 7.4  ALBUMIN 3.8 3.4*   No results for input(s): LIPASE, AMYLASE in the last 168 hours. No results for input(s): AMMONIA in the last 168 hours. Coagulation Profile: Recent Labs  Lab 03/16/20 0555  INR 0.9   Cardiac Enzymes: No results for input(s): CKTOTAL, CKMB, CKMBINDEX, TROPONINI in the last  168 hours. BNP (last 3 results) No results for input(s): PROBNP in the last 8760 hours. HbA1C: Recent Labs    03/15/20 1531  HGBA1C 8.5*   CBG: Recent Labs  Lab 03/15/20 1655 03/15/20 2120 03/16/20 0747 03/16/20 1148 03/16/20 1637  GLUCAP 209* 190* 263* 217* 137*   Lipid Profile: No results for input(s): CHOL, HDL, LDLCALC, TRIG, CHOLHDL, LDLDIRECT in the last 72 hours. Thyroid Function Tests: No results for input(s): TSH, T4TOTAL, FREET4, T3FREE, THYROIDAB in the last 72 hours. Anemia Panel: No  results for input(s): VITAMINB12, FOLATE, FERRITIN, TIBC, IRON, RETICCTPCT in the last 72 hours. Sepsis Labs: Recent Labs  Lab 03/15/20 1531  LATICACIDVEN 1.3    Recent Results (from the past 240 hour(s))  Respiratory Panel by RT PCR (Flu A&B, Covid) - Nasopharyngeal Swab     Status: None   Collection Time: 03/15/20  4:55 PM   Specimen: Nasopharyngeal Swab  Result Value Ref Range Status   SARS Coronavirus 2 by RT PCR NEGATIVE NEGATIVE Final    Comment: (NOTE) SARS-CoV-2 target nucleic acids are NOT DETECTED.  The SARS-CoV-2 RNA is generally detectable in upper respiratoy specimens during the acute phase of infection. The lowest concentration of SARS-CoV-2 viral copies this assay can detect is 131 copies/mL. A negative result does not preclude SARS-Cov-2 infection and should not be used as the sole basis for treatment or other patient management decisions. A negative result may occur with  improper specimen collection/handling, submission of specimen other than nasopharyngeal swab, presence of viral mutation(s) within the areas targeted by this assay, and inadequate number of viral copies (<131 copies/mL). A negative result must be combined with clinical observations, patient history, and epidemiological information. The expected result is Negative.  Fact Sheet for Patients:  https://www.moore.com/  Fact Sheet for Healthcare Providers:  https://www.young.biz/  This test is no t yet approved or cleared by the [place] FDA and  has been authorized for detection and/or diagnosis of SARS-CoV-2 by FDA under an Emergency Use Authorization (EUA). This EUA will remain  in effect (meaning this test can be used) for the duration of the COVID-19 declaration under Section 564(b)(1) of the Act, 21 U.S.C. section 360bbb-3(b)(1), unless the authorization is terminated or revoked sooner.     Influenza A by PCR NEGATIVE NEGATIVE Final   Influenza  B by PCR NEGATIVE NEGATIVE Final    Comment: (NOTE) The Xpert Xpress SARS-CoV-2/FLU/RSV assay is intended as an aid in  the diagnosis of influenza from Nasopharyngeal swab specimens and  should not be used as a sole basis for treatment. Nasal washings and  aspirates are unacceptable for Xpert Xpress SARS-CoV-2/FLU/RSV  testing.  Fact Sheet for Patients: https://www.moore.com/  Fact Sheet for Healthcare Providers: https://www.young.biz/  This test is not yet approved or cleared by the [place] FDA and  has been authorized for detection and/or diagnosis of SARS-CoV-2 by  FDA under an Emergency Use Authorization (EUA). This EUA will remain  in effect (meaning this test can be used) for the duration of the  Covid-19 declaration under Section 564(b)(1) of the Act, 21  U.S.C. section 360bbb-3(b)(1), unless the authorization is  terminated or revoked. Performed at [facility], [address] [place], [place] [address]          Radiology Studies: MR FOOT LEFT W WO CONTRAST  Result Date: 03/16/2020 CLINICAL DATA:  Left foot wound EXAM: MRI OF THE LEFT FOREFOOT WITHOUT AND WITH CONTRAST TECHNIQUE: Multiplanar, multisequence MR imaging of the left forefoot was  performed both before and after administration of intravenous contrast. CONTRAST:  80mL GADAVIST GADOBUTROL 1 MMOL/ML IV SOLN COMPARISON:  X-ray 03/15/2020 FINDINGS: Bones/Joint/Cartilage Loss of cortical definition of the distal tuft of the great toe distal phalanx. There is marrow edema throughout the distal phalanx with associated confluent low T1 signal compatible with acute osteomyelitis. Minimal subchondral bone marrow edema within the great toe proximal phalanx adjacent to the IP joint with preservation of the fatty T1 bone marrow signal. Remaining osseous structures of the forefoot are normal in appearance and signal. No acute fracture. No malalignment. Trace first MTP joint  effusion. Ligaments Intact Lisfranc ligament. Collateral ligaments of the forefoot intact. Muscles and Tendons Edema-like signal throughout the intrinsic foot musculature suggesting a nonspecific myositis. Intact flexor and extensor tendons. No significant tenosynovial fluid collection peer Soft tissues There is fluid and scattered foci of susceptibility within the subungual region of the great toe. Additional fluid collection is present along the plantar surface of the great toe distal phalanx measuring 1.4 x 0.9 x 1.6 cm (series 8, image 11; series 5, image 8). There is surrounding soft tissue edema at the level of the great toe. Mild diffuse dorsal subcutaneous edema of the forefoot. IMPRESSION: 1. Acute osteomyelitis of the distal tuft of the left great toe distal phalanx. 2. Fluid and scattered foci of susceptibility within the subungual region of the great toe. Additional fluid collection along the plantar surface of the great toe distal phalanx measuring 1.4 x 0.9 x 1.6 cm, which may represent a small abscesses or phlegmonous change. 3. Minimal subchondral bone marrow edema within the great toe proximal phalanx adjacent to the IP joint with preservation of the fatty T1 bone marrow signal. Findings may be reactive. Early osteomyelitis not excluded. 4. Edema-like signal throughout the intrinsic foot musculature suggesting a nonspecific myositis. These findings are in agreement with the preliminary report issued by Carron Curie, MD. The preliminary findings were called to Webb Silversmith, NP at 9:32 p.m. on 03/15/2020 by the professional radiology assistant. Electronically Signed   By: Duanne Guess D.O.   On: 03/16/2020 08:13   DG Chest Port 1 View  Result Date: 03/15/2020 CLINICAL DATA:  Cough. Wound infection. Diabetes. Hypertension. Smoker. EXAM: PORTABLE CHEST 1 VIEW COMPARISON:  06/17/2019 FINDINGS: Midline trachea. Normal heart size. Atherosclerosis in the transverse aorta. No pleural effusion or  pneumothorax. Mild pulmonary interstitial thickening is likely related to smoking/chronic bronchitis. The right suprahilar airspace disease has resolved since the prior radiograph. IMPRESSION: No acute cardiopulmonary disease. Aortic Atherosclerosis (ICD10-I70.0). Electronically Signed   By: Jeronimo Greaves M.D.   On: 03/15/2020 17:57   DG Foot Complete Left  Result Date: 03/15/2020 Please see detailed radiograph report in office note.       Scheduled Meds:  amLODipine  5 mg Oral Daily   citalopram  20 mg Oral Daily   cloNIDine  0.1 mg Oral QHS   enoxaparin (LOVENOX) injection  40 mg Subcutaneous Q24H   gabapentin  300 mg Oral BID   insulin aspart  0-15 Units Subcutaneous TID WC   insulin aspart  0-5 Units Subcutaneous QHS   living well with diabetes book   Does not apply Once   mometasone-formoterol  2 puff Inhalation BID   mupirocin ointment  1 application Nasal BID   nicotine  21 mg Transdermal Daily   pantoprazole  40 mg Oral Daily   rosuvastatin  5 mg Oral Daily   Continuous Infusions:  sodium chloride 100 mL/hr at 03/16/20 1430  vancomycin      Assessment & Plan:   Active Problems:   Essential hypertension   Smoking   Type 2 diabetes mellitus with diabetic neuropathy, unspecified (HCC)   Osteomyelitis (HCC)   Infection of left hallux Left foot cellulitis MRI completed found with acute osteomyelitis of the distal tuft of the left great toe distal phalanx.Possible small abscesses or phlegmon exchange Podiatry's input was appreciated-most likely patient needs amputation of the left hallux but need to review MRI.  Continue with vancomycin We will consult ID for further management of antibiotic and duration    Hypertension .  Controlled. Continue with amlodipine and clonidine home doses   Tobacco abuse She was counseled on admission.   Continue with nicotine patch     Emphysema-stable without any exacerbation Continue with duo nebs,  albuterol as needed    Type 2 diabetes mellitus, poor control Patient on Metformin and glipizide Last hemoglobin A1c 9.3 on 01/13/2020 Oral agents on hold  Continue R ISS  Carb control Ck FS, hypoglycemic protocal  Hyperlipidemia Continue crestor  Anxiety/depression Continue on Celexa  Continue nightly Catapres    GERD PPI    DVT prophylaxis: lovenox Code Status: Full Family Communication: None at bedside  Status is: Inpatient  Remains inpatient appropriate because:IV treatments appropriate due to intensity of illness or inability to take PO   Dispo: The patient is from: Home              Anticipated d/c is to: Home              Anticipated d/c date is: 3 days              Patient currently is not medically stable to d/c.            LOS: 1 day   Time spent: 45 min with more than 50% on COC    Lynn Ito, MD [facility] Pager [phone]  If 7PM-7AM, please contact night-coverage [email or web address] Password [facility] 03/16/2020, 5:08 PM

## 2020-03-16 NOTE — Op Note (Signed)
Date of operation: 03/16/2020.  Surgeon: Ricci Barker D.P.M.  Preoperative diagnosis: Osteomyelitis left great toe.  Postoperative diagnosis: 1.  Osteomyelitis left great toe. 2.  Abscess extending along the extensor hallucis longus tendon sheath.  Procedures: 1.  Amputation left great toe. 2.  I&D abscess left dorsal forefoot.  Anesthesia: LMA.  Hemostasis: Pneumatic tourniquet left ankle 250 mmHg.  Estimated blood loss: Less than 5 cc.  Pathology: Left great toe.  Cultures: Bone culture distal phalanx left hallux.  Injectables: 9 cc 0.5% Marcaine plain.  Drains: Saline gauze packed within the dorsal incision.  Complications: None apparent.  Operative indications: This is a 59 year old male with recent development of infection in his left great toe.  Was seen outpatient and decision was made for admission with the need for amputation of the left great toe.  Operative procedure: Patient was taken to the operating room and placed on the table in the supine position.  Following satisfactory LMA anesthesia a pneumatic tourniquet was applied at the level of the left ankle and the foot was prepped and draped in the usual sterile fashion.  The foot was exsanguinated and the tourniquet inflated to 250 mmHg.    Attention was then directed to the distal aspect of the left foot where a fishmouth incision was made coursing medial to lateral around the dorsal and plantar surface of the hallux.  Upon incision dorsally there was noted to be some purulence from around the extensor hallucis longus tendon sheath.  On the plantar incision some necrotic tissue was noted deep to the incision line area.  Incision was carried sharply down to the level of the bone and carried proximally where the proximal phalanx was incised using a sagittal saw and removed in toto.  A bone culture was taken for sensitivities.  The extensor tendon dorsally was then retracted with hemostats and incised using a Beaver blade.   Hemostats applied along the tendon sheath and a second incision was made on the dorsal aspect of the foot approximately 1.5 cm in length.  Next using a versa jet the entire wound area was debrided at the amputation site with the versa jet on a setting of 3 and then thoroughly debrided and irrigated including the dorsal incision and tendon sheath area on a setting of 2.  The wound was then flushed with copious amounts of sterile saline using a bulb syringe and the amputation site was closed using 3-0 nylon vertical mattress and simple erupted sutures.  A couple of 3-0 nylon simple interrupted sutures placed along the proximal dorsal incision with an area distally left open which was packed with a saline wet gauze.  9 cc of 0.5% Marcaine plain were then injected around the first metatarsal area for postoperative analgesia.  Xeroform 4 x 4's ABD and Kerlix applied to the left foot and the tourniquet was released.  A second Kerlix and Ace wrap were then applied to the left lower extremity.  The patient was awakened and transported to the PACU with vital signs stable and in good condition.

## 2020-03-16 NOTE — Plan of Care (Signed)

## 2020-03-16 NOTE — Anesthesia Procedure Notes (Signed)
Procedure Name: LMA Insertion Date/Time: 03/16/2020 5:33 PM Performed by: Elmarie Mainland, CRNA Pre-anesthesia Checklist: Patient identified, Emergency Drugs available, Suction available and Patient being monitored Patient Re-evaluated:Patient Re-evaluated prior to induction Oxygen Delivery Method: Circle system utilized Preoxygenation: Pre-oxygenation with 100% oxygen Induction Type: IV induction Ventilation: Mask ventilation without difficulty LMA: LMA inserted LMA Size: 4.5 Number of attempts: 1 Placement Confirmation: positive ETCO2 and breath sounds checked- equal and bilateral Tube secured with: Tape Dental Injury: Teeth and Oropharynx as per pre-operative assessment

## 2020-03-16 NOTE — Progress Notes (Signed)
Inpatient Diabetes Program Recommendations  AACE/ADA: New Consensus Statement on Inpatient Glycemic Control (2015)  Target Ranges:  Prepandial:   less than 140 mg/dL      Peak postprandial:   less than 180 mg/dL (1-2 hours)      Critically ill patients:  140 - 180 mg/dL   Lab Results  Component Value Date   GLUCAP 217 (H) 03/16/2020   HGBA1C 8.5 (H) 03/15/2020    Review of Glycemic Control Results for DEMIR, TITSWORTH (MRN [id number]) as of 03/16/2020 12:19  Ref. Range 03/15/2020 16:55 03/15/2020 21:20 03/16/2020 07:47 03/16/2020 11:48  Glucose-Capillary Latest Ref Range: 70 - 99 mg/dL 165 (H) 790 (H) 383 (H) 217 (H)   Diabetes history: DM2 Outpatient Diabetes medications: Glucotrol 2.5 mg bid +Metformin 1 gm bid Current orders for Inpatient glycemic control: Novolog moderate correction 0-15 units tid + hs 0-5 units  Inpatient Diabetes Program Recommendations:   Noted patient being admitted. While oral DM medications held, please consider: -Lantus 15 units daily (0.2 units/kg x 84.8 kg = 17 units) Will follow while in the hospital. A1c elevated @ 9.3.  Thank you, Billy Fischer. Nijae Doyel, RN, MSN, CDE  Diabetes Coordinator Inpatient Glycemic Control Team Team Pager [phone] (8am-5pm) 03/16/2020 12:22 PM

## 2020-03-16 NOTE — Interval H&P Note (Signed)
History and Physical Interval Note:  03/16/2020 5:15 PM  Timothy [name].  has presented today for surgery, with the diagnosis of Osteomyelitis.  The various methods of treatment have been discussed with the patient and family. After consideration of risks, benefits and other options for treatment, the patient has consented to  Procedure(s): AMPUTATION GREAT TOE (Left) as a surgical intervention.  The patient's history has been reviewed, patient examined, no change in status, stable for surgery.  I have reviewed the patient's chart and labs.  Questions were answered to the patient's satisfaction.     Ricci Barker

## 2020-03-16 NOTE — Transfer of Care (Signed)
Immediate Anesthesia Transfer of Care Note  Patient: Timothy [name].  Procedure(s) Performed: AMPUTATION GREAT TOE (Left Toe)  Patient Location: PACU  Anesthesia Type:General  Level of Consciousness: awake, alert , oriented and patient cooperative  Airway & Oxygen Therapy: Patient Spontanous Breathing and Patient connected to face mask oxygen  Post-op Assessment: Report given to RN and Post -op Vital signs reviewed and stable  Post vital signs: Reviewed and stable  Last Vitals:  Vitals Value Taken Time  BP 113/82 03/16/20 1823  Temp    Pulse 72 03/16/20 1826  Resp 21 03/16/20 1826  SpO2 98 % 03/16/20 1826  Vitals shown include unvalidated device data.  Last Pain:  Vitals:   03/16/20 1259  TempSrc: Oral  PainSc:          Complications: No complications documented.

## 2020-03-17 ENCOUNTER — Encounter: Payer: Self-pay | Admitting: Podiatry

## 2020-03-17 ENCOUNTER — Other Ambulatory Visit (INDEPENDENT_AMBULATORY_CARE_PROVIDER_SITE_OTHER): Payer: Self-pay | Admitting: Vascular Surgery

## 2020-03-17 DIAGNOSIS — L089 Local infection of the skin and subcutaneous tissue, unspecified: Secondary | ICD-10-CM

## 2020-03-17 DIAGNOSIS — E119 Type 2 diabetes mellitus without complications: Secondary | ICD-10-CM

## 2020-03-17 DIAGNOSIS — E11628 Type 2 diabetes mellitus with other skin complications: Secondary | ICD-10-CM

## 2020-03-17 DIAGNOSIS — I739 Peripheral vascular disease, unspecified: Secondary | ICD-10-CM

## 2020-03-17 DIAGNOSIS — M869 Osteomyelitis, unspecified: Secondary | ICD-10-CM

## 2020-03-17 LAB — GLUCOSE, CAPILLARY
Glucose-Capillary: 184 mg/dL — ABNORMAL HIGH (ref 70–99)
Glucose-Capillary: 159 mg/dL — ABNORMAL HIGH (ref 70–99)
Glucose-Capillary: 133 mg/dL — ABNORMAL HIGH (ref 70–99)
Glucose-Capillary: 174 mg/dL — ABNORMAL HIGH (ref 70–99)

## 2020-03-17 LAB — CREATININE, SERUM
Creatinine, Ser: 0.75 mg/dL (ref 0.61–1.24)
GFR calc Af Amer: >60 mL/min (ref >60)
GFR calc non Af Amer: >60 mL/min (ref >60)

## 2020-03-17 LAB — POTASSIUM: Potassium: 3.7 mmol/L (ref 3.5–5.1)

## 2020-03-17 MED ORDER — SODIUM CHLORIDE 0.9 % IV SOLN
2.0000 g | INTRAVENOUS | Status: DC
  Administered 2020-03-17: 2 g via INTRAVENOUS
  Filled 2020-03-17: qty 2
  Filled 2020-03-17: qty 20

## 2020-03-17 MED ORDER — SODIUM CHLORIDE 0.9 % IV SOLN: INTRAVENOUS | Status: DC

## 2020-03-17 NOTE — Progress Notes (Signed)
PROGRESS NOTE    Timothy Sill.  GGY:[id number] DOB: May 13, 1961 DOA: 03/15/2020 PCP: Rolm Gala, NP    Brief Narrative:  Timothy Sheils. is a 59 y.o. male with medical history significant of hypertension, type 2 diabetes mellitus, hypercholesterolemia, tobacco abuse, depression/anxiety who presents to the ED for evaluation of a infection and his left hallux. Podiatry was consulted. MRI completed.     Consultants:   Podiatry  Procedures:  MRI 1. Acute osteomyelitis of the distal tuft of the left great toe distal phalanx. 2. Fluid and scattered foci of susceptibility within the subungual region of the great toe. Additional fluid collection along the plantar surface of the great toe distal phalanx measuring 1.4 x 0.9 x 1.6 cm, which may represent a small abscesses or phlegmonous change. 3. Minimal subchondral bone marrow edema within the great toe proximal phalanx adjacent to the IP joint with preservation of the fatty T1 bone marrow signal. Findings may be reactive. Early osteomyelitis not excluded. 4. Edema-like signal throughout the intrinsic foot musculature suggesting a nonspecific myositis  Antimicrobials:    vancomycin   Subjective: No complaints of pain or any other issues.   Objective: Vitals:   03/16/20 1948 03/17/20 0026 03/17/20 0359 03/17/20 0737  BP: (!) 145/84 129/82 (!) 149/88 (!) 149/93  Pulse: 62 67 73 69  Resp: 16 16 19 18   Temp: 98.6 F (37 C) 98.8 F (37.1 C) 98.4 F (36.9 C) 98 F (36.7 C)  TempSrc: Oral Oral Oral Oral  SpO2: 93% 94% 94% 96%  Weight:      Height:        Intake/Output Summary (Last 24 hours) at 03/17/2020 0817 Last data filed at 03/17/2020 03/19/2020 Gross per 24 hour  Intake 1839.87 ml  Output 1050 ml  Net 789.87 ml   Filed Weights   03/15/20 1519  Weight: 84.8 kg    Examination: Comfortable, laying in bed NAD CTA, no rales wheeze rhonchi's Regular S1-S2 no murmurs rubs gallops Soft nontender  nondistended positive bowel sounds Right lower extremity no edema, left lower extremity with edema and wound dressing on with Ace bandage wrapping did not open Alert oriented x3  Mood and affect appropriate    Data Reviewed: I have personally reviewed following labs and imaging studies  CBC: Recent Labs  Lab 03/15/20 1531 03/16/20 0555  WBC 9.9 8.0  NEUTROABS 7.0  --   HGB 13.3 12.8*  HCT 37.5* 36.2*  MCV 97.2 96.5  PLT 308 273   Basic Metabolic Panel: Recent Labs  Lab 03/15/20 1531 03/16/20 0555 03/17/20 0449  NA 132* 133*  --   K 3.7 3.5 3.7  CL 97* 95*  --   CO2 24 26  --   GLUCOSE 236* 225*  --   BUN 17 13  --   CREATININE 0.77 0.79 0.75  CALCIUM 9.1 8.9  --    GFR: Estimated Creatinine Clearance: 103.9 mL/min (by C-G formula based on SCr of 0.75 mg/dL). Liver Function Tests: Recent Labs  Lab 03/15/20 1531 03/16/20 0555  AST 21 17  ALT 21 18  ALKPHOS 99 92  BILITOT 0.7 0.7  PROT 7.9 7.4  ALBUMIN 3.8 3.4*   No results for input(s): LIPASE, AMYLASE in the last 168 hours. No results for input(s): AMMONIA in the last 168 hours. Coagulation Profile: Recent Labs  Lab 03/16/20 0555  INR 0.9   Cardiac Enzymes: No results for input(s): CKTOTAL, CKMB, CKMBINDEX, TROPONINI in the last 168 hours. BNP (  last 3 results) No results for input(s): PROBNP in the last 8760 hours. HbA1C: Recent Labs    03/15/20 1531  HGBA1C 8.5*   CBG: Recent Labs  Lab 03/16/20 0747 03/16/20 1148 03/16/20 1637 03/16/20 1830 03/16/20 2118  GLUCAP 263* 217* 137* 147* 144*   Lipid Profile: No results for input(s): CHOL, HDL, LDLCALC, TRIG, CHOLHDL, LDLDIRECT in the last 72 hours. Thyroid Function Tests: No results for input(s): TSH, T4TOTAL, FREET4, T3FREE, THYROIDAB in the last 72 hours. Anemia Panel: No results for input(s): VITAMINB12, FOLATE, FERRITIN, TIBC, IRON, RETICCTPCT in the last 72 hours. Sepsis Labs: Recent Labs  Lab 03/15/20 1531  LATICACIDVEN 1.3     Recent Results (from the past 240 hour(s))  Respiratory Panel by RT PCR (Flu A&B, Covid) - Nasopharyngeal Swab     Status: None   Collection Time: 03/15/20  4:55 PM   Specimen: Nasopharyngeal Swab  Result Value Ref Range Status   SARS Coronavirus 2 by RT PCR NEGATIVE NEGATIVE Final    Comment: (NOTE) SARS-CoV-2 target nucleic acids are NOT DETECTED.  The SARS-CoV-2 RNA is generally detectable in upper respiratoy specimens during the acute phase of infection. The lowest concentration of SARS-CoV-2 viral copies this assay can detect is 131 copies/mL. A negative result does not preclude SARS-Cov-2 infection and should not be used as the sole basis for treatment or other patient management decisions. A negative result may occur with  improper specimen collection/handling, submission of specimen other than nasopharyngeal swab, presence of viral mutation(s) within the areas targeted by this assay, and inadequate number of viral copies (<131 copies/mL). A negative result must be combined with clinical observations, patient history, and epidemiological information. The expected result is Negative.  Fact Sheet for Patients:  https://www.moore.com/  Fact Sheet for Healthcare Providers:  https://www.young.biz/  This test is no t yet approved or cleared by the [place] FDA and  has been authorized for detection and/or diagnosis of SARS-CoV-2 by FDA under an Emergency Use Authorization (EUA). This EUA will remain  in effect (meaning this test can be used) for the duration of the COVID-19 declaration under Section 564(b)(1) of the Act, 21 U.S.C. section 360bbb-3(b)(1), unless the authorization is terminated or revoked sooner.     Influenza A by PCR NEGATIVE NEGATIVE Final   Influenza B by PCR NEGATIVE NEGATIVE Final    Comment: (NOTE) The Xpert Xpress SARS-CoV-2/FLU/RSV assay is intended as an aid in  the diagnosis of influenza from  Nasopharyngeal swab specimens and  should not be used as a sole basis for treatment. Nasal washings and  aspirates are unacceptable for Xpert Xpress SARS-CoV-2/FLU/RSV  testing.  Fact Sheet for Patients: https://www.moore.com/  Fact Sheet for Healthcare Providers: https://www.young.biz/  This test is not yet approved or cleared by the [place] FDA and  has been authorized for detection and/or diagnosis of SARS-CoV-2 by  FDA under an Emergency Use Authorization (EUA). This EUA will remain  in effect (meaning this test can be used) for the duration of the  Covid-19 declaration under Section 564(b)(1) of the Act, 21  U.S.C. section 360bbb-3(b)(1), unless the authorization is  terminated or revoked. Performed at [facility], [address] [place], [place] [address]          Radiology Studies: MR FOOT LEFT W WO CONTRAST  Result Date: 03/16/2020 CLINICAL DATA:  Left foot wound EXAM: MRI OF THE LEFT FOREFOOT WITHOUT AND WITH CONTRAST TECHNIQUE: Multiplanar, multisequence MR imaging of the left forefoot was performed both before  and after administration of intravenous contrast. CONTRAST:  68mL GADAVIST GADOBUTROL 1 MMOL/ML IV SOLN COMPARISON:  X-ray 03/15/2020 FINDINGS: Bones/Joint/Cartilage Loss of cortical definition of the distal tuft of the great toe distal phalanx. There is marrow edema throughout the distal phalanx with associated confluent low T1 signal compatible with acute osteomyelitis. Minimal subchondral bone marrow edema within the great toe proximal phalanx adjacent to the IP joint with preservation of the fatty T1 bone marrow signal. Remaining osseous structures of the forefoot are normal in appearance and signal. No acute fracture. No malalignment. Trace first MTP joint effusion. Ligaments Intact Lisfranc ligament. Collateral ligaments of the forefoot intact. Muscles and Tendons Edema-like signal throughout the intrinsic  foot musculature suggesting a nonspecific myositis. Intact flexor and extensor tendons. No significant tenosynovial fluid collection peer Soft tissues There is fluid and scattered foci of susceptibility within the subungual region of the great toe. Additional fluid collection is present along the plantar surface of the great toe distal phalanx measuring 1.4 x 0.9 x 1.6 cm (series 8, image 11; series 5, image 8). There is surrounding soft tissue edema at the level of the great toe. Mild diffuse dorsal subcutaneous edema of the forefoot. IMPRESSION: 1. Acute osteomyelitis of the distal tuft of the left great toe distal phalanx. 2. Fluid and scattered foci of susceptibility within the subungual region of the great toe. Additional fluid collection along the plantar surface of the great toe distal phalanx measuring 1.4 x 0.9 x 1.6 cm, which may represent a small abscesses or phlegmonous change. 3. Minimal subchondral bone marrow edema within the great toe proximal phalanx adjacent to the IP joint with preservation of the fatty T1 bone marrow signal. Findings may be reactive. Early osteomyelitis not excluded. 4. Edema-like signal throughout the intrinsic foot musculature suggesting a nonspecific myositis. These findings are in agreement with the preliminary report issued by Carron Curie, MD. The preliminary findings were called to Timothy Silversmith, NP at 9:32 p.m. on 03/15/2020 by the professional radiology assistant. Electronically Signed   By: Duanne Guess D.O.   On: 03/16/2020 08:13   DG Chest Port 1 View  Result Date: 03/15/2020 CLINICAL DATA:  Cough. Wound infection. Diabetes. Hypertension. Smoker. EXAM: PORTABLE CHEST 1 VIEW COMPARISON:  06/17/2019 FINDINGS: Midline trachea. Normal heart size. Atherosclerosis in the transverse aorta. No pleural effusion or pneumothorax. Mild pulmonary interstitial thickening is likely related to smoking/chronic bronchitis. The right suprahilar airspace disease has resolved since  the prior radiograph. IMPRESSION: No acute cardiopulmonary disease. Aortic Atherosclerosis (ICD10-I70.0). Electronically Signed   By: Jeronimo Greaves M.D.   On: 03/15/2020 17:57   DG Foot Complete Left  Result Date: 03/15/2020 Please see detailed radiograph report in office note.       Scheduled Meds: . amLODipine  5 mg Oral Daily  . citalopram  20 mg Oral Daily  . cloNIDine  0.1 mg Oral QHS  . enoxaparin (LOVENOX) injection  40 mg Subcutaneous Q24H  . gabapentin  300 mg Oral BID  . insulin aspart  0-15 Units Subcutaneous TID WC  . insulin aspart  0-5 Units Subcutaneous QHS  . living well with diabetes book   Does not apply Once  . mometasone-formoterol  2 puff Inhalation BID  . mupirocin ointment  1 application Nasal BID  . nicotine  21 mg Transdermal Daily  . pantoprazole  40 mg Oral Daily  . rosuvastatin  5 mg Oral Daily   Continuous Infusions: . sodium chloride 100 mL/hr at 03/17/20 0356  . vancomycin  Stopped (03/16/20 2310)    Assessment & Plan:   Active Problems:   Essential hypertension   Smoking   Type 2 diabetes mellitus with diabetic neuropathy, unspecified (HCC)   Osteomyelitis (HCC)   Infection of left hallux Left foot cellulitis MRI completed found with acute osteomyelitis of the distal tuft of the left great toe distal phalanx.Possible small abscesses or phlegmon exchange Podiatry following -status post amputation left great toe and I&D abscess left dorsal forefoot on 9/29  ID consult input pending Continue vancomycin Per surgery consulted for evaluation of his peripheral arterial disease, will undergo left lower extremity angiogram with possible intervention and attempt to assess patient's anatomy contributing to the degree of atherosclerotic disease.  This will be done tomorrow N.p.o. at midnight     Hypertension Controlled and stable. Continue amlodipine and clonidine home doses    Tobacco abuse Continue with nicotine patch       Emphysema-stable without any exacerbation Continue with duo nebs, albuterol MDI     Type 2 diabetes mellitus, poor control Patient on Metformin and glipizide Last hemoglobin A1c 9.3 on 01/13/2020 Oral agents on hold  Blood glucose levels stable here  Continue R ISS  Hypoglycemic protocol    Hyperlipidemia Continue crestor  Anxiety/depression Continue on Celexa  Continue nightly Catapres    GERD PPI    DVT prophylaxis: lovenox Code Status: Full Family Communication: None at bedside  Status is: Inpatient  Remains inpatient appropriate because:IV treatments appropriate due to intensity of illness or inability to take PO   Dispo: The patient is from: Home              Anticipated d/c is to: Home              Anticipated d/c date is: 3 days              Patient currently is not medically stable to d/c.plan for angiogram in am.             LOS: 2 days   Time spent: 45 min with more than 50% on COC    Lynn ItoSahar Basheer Molchan, MD [facility] Pager [phone]  If 7PM-7AM, please contact night-coverage [email or web address] Password [email or web address] 03/17/2020, 8:17 AM

## 2020-03-17 NOTE — Progress Notes (Signed)
1 Day Post-Op   Subjective/Chief Complaint: Patient seen.  Some pain at the amputation site overnight.   Objective: Vital signs in last 24 hours: Temp:  [97.8 F (36.6 C)-98.8 F (37.1 C)] 98.2 F (36.8 C) (09/30 1134) Pulse Rate:  [62-73] 73 (09/30 1134) Resp:  [16-19] 18 (09/30 1134) BP: (113-162)/(73-93) 162/88 (09/30 1134) SpO2:  [90 %-100 %] 95 % (09/30 1134) Last BM Date: 03/15/20  Intake/Output from previous day: 09/29 0701 - 09/30 0700 In: 1839.9 [P.O.:240; I.V.:1349.9; IV Piggyback:250] Out: 600 [Urine:600] Intake/Output this shift: Total I/O In: -  Out: 450 [Urine:450]  Bandage on the left foot is dry and intact.  Some moderate bleeding and drainage noted on the bandaging.  Upon removal incision area and the remainder of the hallux as well as dorsally on the top of the foot appear to be fairly cyanotic in appearance.  No expressible purulence noted at this point.      Lab Results:  Recent Labs    03/15/20 1531 03/16/20 0555  WBC 9.9 8.0  HGB 13.3 12.8*  HCT 37.5* 36.2*  PLT 308 273   BMET Recent Labs    03/15/20 1531 03/15/20 1531 03/16/20 0555 03/17/20 0449  NA 132*  --  133*  --   K 3.7   < > 3.5 3.7  CL 97*  --  95*  --   CO2 24  --  26  --   GLUCOSE 236*  --  225*  --   BUN 17  --  13  --   CREATININE 0.77   < > 0.79 0.75  CALCIUM 9.1  --  8.9  --    < > = values in this interval not displayed.   PT/INR Recent Labs    03/16/20 0555  LABPROT 12.2  INR 0.9   ABG No results for input(s): PHART, HCO3 in the last 72 hours.  Invalid input(s): PCO2, PO2  Studies/Results: MR FOOT LEFT W WO CONTRAST  Result Date: 03/16/2020 CLINICAL DATA:  Left foot wound EXAM: MRI OF THE LEFT FOREFOOT WITHOUT AND WITH CONTRAST TECHNIQUE: Multiplanar, multisequence MR imaging of the left forefoot was performed both before and after administration of intravenous contrast. CONTRAST:  34mL GADAVIST GADOBUTROL 1 MMOL/ML IV SOLN COMPARISON:  X-ray 03/15/2020  FINDINGS: Bones/Joint/Cartilage Loss of cortical definition of the distal tuft of the great toe distal phalanx. There is marrow edema throughout the distal phalanx with associated confluent low T1 signal compatible with acute osteomyelitis. Minimal subchondral bone marrow edema within the great toe proximal phalanx adjacent to the IP joint with preservation of the fatty T1 bone marrow signal. Remaining osseous structures of the forefoot are normal in appearance and signal. No acute fracture. No malalignment. Trace first MTP joint effusion. Ligaments Intact Lisfranc ligament. Collateral ligaments of the forefoot intact. Muscles and Tendons Edema-like signal throughout the intrinsic foot musculature suggesting a nonspecific myositis. Intact flexor and extensor tendons. No significant tenosynovial fluid collection peer Soft tissues There is fluid and scattered foci of susceptibility within the subungual region of the great toe. Additional fluid collection is present along the plantar surface of the great toe distal phalanx measuring 1.4 x 0.9 x 1.6 cm (series 8, image 11; series 5, image 8). There is surrounding soft tissue edema at the level of the great toe. Mild diffuse dorsal subcutaneous edema of the forefoot. IMPRESSION: 1. Acute osteomyelitis of the distal tuft of the left great toe distal phalanx. 2. Fluid and scattered foci of susceptibility within  the subungual region of the great toe. Additional fluid collection along the plantar surface of the great toe distal phalanx measuring 1.4 x 0.9 x 1.6 cm, which may represent a small abscesses or phlegmonous change. 3. Minimal subchondral bone marrow edema within the great toe proximal phalanx adjacent to the IP joint with preservation of the fatty T1 bone marrow signal. Findings may be reactive. Early osteomyelitis not excluded. 4. Edema-like signal throughout the intrinsic foot musculature suggesting a nonspecific myositis. These findings are in agreement with the  preliminary report issued by Carron Curie, MD. The preliminary findings were called to Webb Silversmith, NP at 9:32 p.m. on 03/15/2020 by the professional radiology assistant. Electronically Signed   By: Duanne Guess D.O.   On: 03/16/2020 08:13   DG Chest Port 1 View  Result Date: 03/15/2020 CLINICAL DATA:  Cough. Wound infection. Diabetes. Hypertension. Smoker. EXAM: PORTABLE CHEST 1 VIEW COMPARISON:  06/17/2019 FINDINGS: Midline trachea. Normal heart size. Atherosclerosis in the transverse aorta. No pleural effusion or pneumothorax. Mild pulmonary interstitial thickening is likely related to smoking/chronic bronchitis. The right suprahilar airspace disease has resolved since the prior radiograph. IMPRESSION: No acute cardiopulmonary disease. Aortic Atherosclerosis (ICD10-I70.0). Electronically Signed   By: Jeronimo Greaves M.D.   On: 03/15/2020 17:57   DG Foot Complete Left  Result Date: 03/15/2020 Please see detailed radiograph report in office note.   Anti-infectives: Anti-infectives (From admission, onward)   Start     Dose/Rate Route Frequency Ordered Stop   03/17/20 1200  cefTRIAXone (ROCEPHIN) 2 g in sodium chloride 0.9 % 100 mL IVPB        2 g 200 mL/hr over 30 Minutes Intravenous Every 24 hours 03/17/20 1101     03/16/20 2000  vancomycin (VANCOREADY) IVPB 1250 mg/250 mL        1,250 mg 166.7 mL/hr over 90 Minutes Intravenous Every 12 hours 03/16/20 1311     03/16/20 0600  vancomycin (VANCOCIN) IVPB 1000 mg/200 mL premix  Status:  Discontinued        1,000 mg 200 mL/hr over 60 Minutes Intravenous Every 8 hours 03/15/20 1726 03/16/20 1311   03/15/20 1800  vancomycin (VANCOCIN) IVPB 1000 mg/200 mL premix        1,000 mg 200 mL/hr over 60 Minutes Intravenous  Once 03/15/20 1725 03/15/20 2103   03/15/20 1600  vancomycin (VANCOCIN) IVPB 1000 mg/200 mL premix        1,000 mg 200 mL/hr over 60 Minutes Intravenous  Once 03/15/20 1554 03/15/20 1823   03/15/20 1600  meropenem (MERREM) 1 g in  sodium chloride 0.9 % 100 mL IVPB  Status:  Discontinued        1 g 200 mL/hr over 30 Minutes Intravenous Every 8 hours 03/15/20 1554 03/16/20 1301      Assessment/Plan: s/p Procedure(s): AMPUTATION GREAT TOE (Left) Assessment: Signs of early necrosis status post amputation left hallux.   Plan: Saline gauze packed within the dorsal wound that was left open for drainage.  Betadine gauze applied to the incision line followed by a bulky gauze bandage and Kerlix.  Discussed with the patient that there is a high likelihood that he will need some type of revision procedure.  Consult vascular for evaluation of his circulation given the appearance of the wound and his history.  Vascular notified and may be able to have this performed tomorrow.  LOS: 2 days    Ricci Barker 03/17/2020

## 2020-03-17 NOTE — H&P (View-Only) (Signed)
[facility] Vascular Consult Note  MRN : [id number]  Timothy SillWarren B Alperin Jr. is a 59 y.o. (04-03-1961) male who presents with chief complaint of  Chief Complaint  Patient presents with  . Wound Infection   History of Present Illness:  Timothy SillWarren B Timothy Jr. is a 59 year old male with medical history significant of hypertension, type 2 diabetes mellitus, hypercholesterolemia, tobacco abuse, depression/anxiety who presents to the ED for evaluation of a infection and his left hallux. Patient states the infection and resultant redness have been going on for quite a few days and acutely worsened over the past 24 hours.  Last hemoglobin A1c was unknown.  Patient was in the podiatry office today and did not demonstrate any systemic signs of infection.  Podiatry note indicates ulcer to the left hallux with cellulitis to the midfoot with suspected underlying osteomyelitis of the distal phalanx.  Patient underwent office-based debridement and dressing with wet-to-dry and was directed to [facility] for further treatment.   On 03/16/20 the patient underwent: 1.  Amputation left great toe. 2.  I&D abscess left dorsal forefoot.  During dressing change today by podiatry, the left first toe was noted to be dusky in color. Worrisome for possible revision /  further amputation.  Vascular was consulted by Dr. Alberteen Spindleline for possible endovascular intervention.   Current Facility-Administered Medications  Medication Dose Route Frequency Provider Last Rate Last Admin  . 0.9 %  sodium chloride infusion   Intravenous Continuous Linus GalasCline, Todd, [name] 100 mL/hr at 03/17/20 0356 New Bag at 03/17/20 0356  . acetaminophen (TYLENOL) tablet 650 mg  650 mg Oral Q6H PRN Linus Galasline, Todd, [name]       Or  . acetaminophen (TYLENOL) suppository 650 mg  650 mg Rectal Q6H PRN Linus Galasline, Todd, [name]      . albuterol (PROVENTIL) (2.5 MG/3ML) 0.083% nebulizer solution 2.5 mg  2.5 mg Nebulization Q2H PRN Linus Galasline, Todd, [name]       . amLODipine (NORVASC) tablet 5 mg  5 mg Oral Daily Linus Galasline, Todd, [name]   5 mg at 03/17/20 0905  . cefTRIAXone (ROCEPHIN) 2 g in sodium chloride 0.9 % 100 mL IVPB  2 g Intravenous Q24H Ravishankar, Rhodia AlbrightJayashree, MD 200 mL/hr at 03/17/20 1256 2 g at 03/17/20 1256  . citalopram (CELEXA) tablet 20 mg  20 mg Oral Daily Linus Galasline, Todd, [name]   20 mg at 03/17/20 0905  . cloNIDine (CATAPRES) tablet 0.1 mg  0.1 mg Oral QHS Linus Galasline, Todd, [name]   0.1 mg at 03/16/20 2121  . enoxaparin (LOVENOX) injection 40 mg  40 mg Subcutaneous Q24H Linus Galasline, Todd, [name]   40 mg at 03/16/20 2121  . gabapentin (NEURONTIN) capsule 300 mg  300 mg Oral BID Linus Galasline, Todd, [name]   300 mg at 03/17/20 [id number]  . hydrALAZINE (APRESOLINE) injection 10 mg  10 mg Intravenous Q4H PRN Linus Galasline, Todd, [name]      . insulin aspart (novoLOG) injection 0-15 Units  0-15 Units Subcutaneous TID WC Linus Galasline, Todd, [name]   3 Units at 03/17/20 1205  . insulin aspart (novoLOG) injection 0-5 Units  0-5 Units Subcutaneous QHS Linus Galasline, Todd, [name]      . living well with diabetes book MISC   Does not apply Once Linus Galasline, Todd, [name]      . mometasone-formoterol [facility](DULERA) 200-5 MCG/ACT inhaler 2 puff  2 puff Inhalation BID Linus Galasline, Todd, [name]   2 puff at 03/17/20 0906  . morphine 2 MG/ML injection 2 mg  2 mg  Intravenous Q4H PRN Cline, Todd, [name]      . mupirocin ointment (BACTROBAN) 2 % 1 application  1 application Nasal BID Cline, Todd, [name]   1 application at 03/17/20 0909  . nicotine (NICODERM CQ - dosed in mg/24 hours) patch 21 mg  21 mg Transdermal Daily Cline, Todd, [name]   21 mg at 03/17/20 0908  . ondansetron (ZOFRAN) tablet 4 mg  4 mg Oral Q6H PRN Cline, Todd, [name]       Or  . ondansetron (ZOFRAN) injection 4 mg  4 mg Intravenous Q6H PRN Cline, Todd, [name]      . oxyCODONE (Oxy IR/ROXICODONE) immediate release tablet 5 mg  5 mg Oral Q4H PRN Cline, Todd, [name]   5 mg at 03/17/20 1253  . pantoprazole (PROTONIX) EC tablet 40 mg  40 mg Oral Daily Cline, Todd, [name]   40 mg at 03/17/20 0905  .  rosuvastatin (CRESTOR) tablet 5 mg  5 mg Oral Daily Cline, Todd, [name]   5 mg at 03/17/20 0905  . senna-docusate (Senokot-S) tablet 1 tablet  1 tablet Oral QHS PRN Cline, Todd, [name]      . traZODone (DESYREL) tablet 25 mg  25 mg Oral QHS PRN Cline, Todd, [name]      . vancomycin (VANCOREADY) IVPB 1250 mg/250 mL  1,250 mg Intravenous Q12H Cline, Todd, [name] 166.7 mL/hr at 03/17/20 0911 1,250 mg at 03/17/20 0911   Past Medical History:  Diagnosis Date  . Diabetes mellitus without complication (HCC)   . Hypercholesteremia   . Hypertension    Past Surgical History:  Procedure Laterality Date  . AMPUTATION TOE Left 03/16/2020   Procedure: AMPUTATION GREAT TOE;  Surgeon: Cline, Todd, [name];  Location: [facility] ORS;  Service: Podiatry;  Laterality: Left;  . COLONOSCOPY WITH PROPOFOL N/A 12/22/2019   Procedure: COLONOSCOPY WITH PROPOFOL;  Surgeon: Wohl, Darren, MD;  Location: [facility] ENDOSCOPY;  Service: Endoscopy;  Laterality: N/A;  . TONSILLECTOMY    . WISDOM TOOTH EXTRACTION     Social History Social History   Tobacco Use  . Smoking status: Current Every Day Smoker    Packs/day: 1.50    Years: 41.00    Pack years: 61.50    Types: Cigarettes  . Smokeless tobacco: Never Used  Vaping Use  . Vaping Use: Never used  Substance Use Topics  . Alcohol use: Yes    Alcohol/week: 6.0 standard drinks    Types: 6 Cans of beer per week    Comment: occasionally   . Drug use: Not Currently   Family History History reviewed. No pertinent family history.  Denies family hx of PAD, venous or renal disease  Allergies  Allergen Reactions  . Bee Venom Anaphylaxis  . Coffee Flavor Shortness Of Breath  . Cheese Swelling    Blue Cheese only  . Penicillins Other (See Comments)    Unknown - remote reaction; "preteen". D/w patient recent use of Keflex and he does not recall having a reaction.   REVIEW OF SYSTEMS (Negative unless checked)  Constitutional: []Weight loss  []Fever  []Chills Cardiac: []Chest pain    []Chest pressure   []Palpitations   []Shortness of breath when laying flat   []Shortness of breath at rest   []Shortness of breath with exertion. Vascular:  []Pain in legs with walking   []Pain in legs at rest   []Pain in legs when laying flat   []Claudication   [x]Pain in feet when walking  [x]Pain in feet at rest  [x]Pain in   feet when laying flat   [] History of DVT   [] Phlebitis   [] Swelling in legs   [] Varicose veins   [] Non-healing ulcers Pulmonary:   [] Uses home oxygen   [] Productive cough   [] Hemoptysis   [] Wheeze  [] COPD   [] Asthma Neurologic:  [] Dizziness  [] Blackouts   [] Seizures   [] History of stroke   [] History of TIA  [] Aphasia   [] Temporary blindness   [] Dysphagia   [] Weakness or numbness in arms   [] Weakness or numbness in legs Musculoskeletal:  [] Arthritis   [] Joint swelling   [] Joint pain   [] Low back pain Hematologic:  [] Easy bruising  [] Easy bleeding   [] Hypercoagulable state   [] Anemic  [] Hepatitis Gastrointestinal:  [] Blood in stool   [] Vomiting blood  [] Gastroesophageal reflux/heartburn   [] Difficulty swallowing. Genitourinary:  [] Chronic kidney disease   [] Difficult urination  [] Frequent urination  [] Burning with urination   [] Blood in urine Skin:  [] Rashes   [x] Ulcers   [x] Wounds Psychological:  [] History of anxiety   []  History of major depression.  Physical Examination  Vitals:   03/17/20 0026 03/17/20 0359 03/17/20 0737 03/17/20 1134  BP: 129/82 (!) 149/88 (!) 149/93 (!) 162/88  Pulse: 67 73 69 73  Resp: 16 19 18 18   Temp: 98.8 F (37.1 C) 98.4 F (36.9 C) 98 F (36.7 C) 98.2 F (36.8 C)  TempSrc: Oral Oral Oral Oral  SpO2: 94% 94% 96% 95%  Weight:      Height:       Body mass index is 26.83 kg/m. Gen:  WD/WN, NAD Head: [employer]/AT, No temporalis wasting. Prominent temp pulse not noted. Ear/Nose/Throat: Hearing grossly intact, nares w/o erythema or drainage, oropharynx w/o Erythema/Exudate Eyes: Sclera non-icteric, conjunctiva clear Neck: Trachea midline.  No  JVD.  Pulmonary:  Good air movement, respirations not labored, equal bilaterally.  Cardiac: RRR, normal S1, S2. Vascular:  Vessel Right Left  Radial Palpable Palpable  Ulnar Palpable Palpable  Brachial Palpable Palpable  Carotid Palpable, without bruit Palpable, without bruit  Aorta Not palpable N/A  Femoral Palpable Palpable  Popliteal Palpable Palpable  PT Palpable Palpable  DP Palpable Palpable   Gastrointestinal: soft, non-tender/non-distended. No guarding/reflex.  Musculoskeletal: M/S 5/5 throughout.  Extremities without ischemic changes.  No deformity or atrophy. No edema. Neurologic: Sensation grossly intact in extremities.  Symmetrical.  Speech is fluent. Motor exam as listed above. Psychiatric: Judgment intact, Mood & affect appropriate for pt's clinical situation. Dermatologic: No rashes or ulcers noted.  No cellulitis or open wounds. Lymph : No Cervical, Axillary, or Inguinal lymphadenopathy.  CBC Lab Results  Component Value Date   WBC 8.0 03/16/2020   HGB 12.8 (L) 03/16/2020   HCT 36.2 (L) 03/16/2020   MCV 96.5 03/16/2020   PLT 273 03/16/2020   BMET    Component Value Date/Time   NA 133 (L) 03/16/2020 0555   NA 130 (L) 10/28/2019 1157   K 3.7 03/17/2020 0449   CL 95 (L) 03/16/2020 0555   CO2 26 03/16/2020 0555   GLUCOSE 225 (H) 03/16/2020 0555   BUN 13 03/16/2020 0555   BUN 13 10/28/2019 1157   CREATININE 0.75 03/17/2020 0449   CALCIUM 8.9 03/16/2020 0555   GFRNONAA >60 03/17/2020 0449   GFRAA >60 03/17/2020 0449   Estimated Creatinine Clearance: 103.9 mL/min (by C-G formula based on SCr of 0.75 mg/dL).  COAG Lab Results  Component Value Date   INR 0.9 03/16/2020   Radiology MR FOOT LEFT W WO CONTRAST  Result Date: 03/16/2020 CLINICAL DATA:  Left foot wound EXAM: MRI OF THE LEFT FOREFOOT WITHOUT AND WITH CONTRAST TECHNIQUE: Multiplanar, multisequence MR imaging of the left forefoot was performed both before and after administration of intravenous  contrast. CONTRAST:  15mL GADAVIST GADOBUTROL 1 MMOL/ML IV SOLN COMPARISON:  X-ray 03/15/2020 FINDINGS: Bones/Joint/Cartilage Loss of cortical definition of the distal tuft of the great toe distal phalanx. There is marrow edema throughout the distal phalanx with associated confluent low T1 signal compatible with acute osteomyelitis. Minimal subchondral bone marrow edema within the great toe proximal phalanx adjacent to the IP joint with preservation of the fatty T1 bone marrow signal. Remaining osseous structures of the forefoot are normal in appearance and signal. No acute fracture. No malalignment. Trace first MTP joint effusion. Ligaments Intact Lisfranc ligament. Collateral ligaments of the forefoot intact. Muscles and Tendons Edema-like signal throughout the intrinsic foot musculature suggesting a nonspecific myositis. Intact flexor and extensor tendons. No significant tenosynovial fluid collection peer Soft tissues There is fluid and scattered foci of susceptibility within the subungual region of the great toe. Additional fluid collection is present along the plantar surface of the great toe distal phalanx measuring 1.4 x 0.9 x 1.6 cm (series 8, image 11; series 5, image 8). There is surrounding soft tissue edema at the level of the great toe. Mild diffuse dorsal subcutaneous edema of the forefoot. IMPRESSION: 1. Acute osteomyelitis of the distal tuft of the left great toe distal phalanx. 2. Fluid and scattered foci of susceptibility within the subungual region of the great toe. Additional fluid collection along the plantar surface of the great toe distal phalanx measuring 1.4 x 0.9 x 1.6 cm, which may represent a small abscesses or phlegmonous change. 3. Minimal subchondral bone marrow edema within the great toe proximal phalanx adjacent to the IP joint with preservation of the fatty T1 bone marrow signal. Findings may be reactive. Early osteomyelitis not excluded. 4. Edema-like signal throughout the intrinsic  foot musculature suggesting a nonspecific myositis. These findings are in agreement with the preliminary report issued by Carron Curie, MD. The preliminary findings were called to Webb Silversmith, NP at 9:32 p.m. on 03/15/2020 by the professional radiology assistant. Electronically Signed   By: Duanne Guess D.O.   On: 03/16/2020 08:13   DG Chest Port 1 View  Result Date: 03/15/2020 CLINICAL DATA:  Cough. Wound infection. Diabetes. Hypertension. Smoker. EXAM: PORTABLE CHEST 1 VIEW COMPARISON:  06/17/2019 FINDINGS: Midline trachea. Normal heart size. Atherosclerosis in the transverse aorta. No pleural effusion or pneumothorax. Mild pulmonary interstitial thickening is likely related to smoking/chronic bronchitis. The right suprahilar airspace disease has resolved since the prior radiograph. IMPRESSION: No acute cardiopulmonary disease. Aortic Atherosclerosis (ICD10-I70.0). Electronically Signed   By: Jeronimo Greaves M.D.   On: 03/15/2020 17:57   DG Foot Complete Left  Result Date: 03/15/2020 Please see detailed radiograph report in office note.  Assessment/Plan Timothy [name]. is a 59 year old male with medical history significant of hypertension, type 2 diabetes mellitus, hypercholesterolemia, tobacco abuse, depression/anxiety who presents to the ED for evaluation of a infection and his left hallux.   1. 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 Now with dusky appearance.  Patient with multiple risk factors for peripheral artery disease.  In setting of multiple risk factors and possible failure recent amputation site recommend undergoing a left lower extremity angiogram with possible intervention and attempt assess patient's anatomy contributing degree of atherosclerotic disease.  If appropriate an attempt to revascularize leg can be  at that time.  Procedure, risks and benefits explained to the patient.  All questions were answered.  Patient wished to proceed.  2.   Diabetes On appropriate medications Encouraged good control as its slows the progression of atherosclerotic disease  3. Tobacco Abuse: We had a discussion for approximately three minutes regarding the absolute need for smoking cessation due to the deleterious nature of tobacco on the vascular system. We discussed the tobacco use would diminish patency of any intervention, and likely significantly worsen progressio of disease. We discussed multiple agents for quitting including replacement therapy or medications to reduce cravings such as Chantix. The patient voices their understanding of the importance of smoking cessation.  Discussed with Dr. Romie Jumper, PA-C  03/17/2020 12:57 PM  This note was created with Dragon medical transcription system.  Any error is purely unintentional

## 2020-03-17 NOTE — Consult Note (Signed)
**Timothy [name]** Timothy Timothy [name]  MRN : [id number]  Timothy SillWarren B Alperin Jr. is a 59 y.o. (04-03-1961) male who presents with chief complaint of  Chief Complaint  Patient presents with  . Wound Infection   History of Present Illness:  Timothy SillWarren B Otwell Jr. is a 59 year old male with medical history significant of hypertension, type 2 diabetes mellitus, hypercholesterolemia, tobacco abuse, depression/anxiety who presents to the ED for evaluation of a infection and his left hallux. Patient states the infection and resultant redness have been going on for quite a few days and acutely worsened over the past 24 hours.  Last hemoglobin A1c was unknown.  Patient was in the podiatry office today and did not demonstrate any systemic signs of infection.  Podiatry Timothy [name] indicates ulcer to the left hallux with cellulitis to the midfoot with suspected underlying osteomyelitis of the distal phalanx.  Patient underwent office-based debridement and dressing with wet-to-dry and was directed to [facility] for further treatment.   On 03/16/20 the patient underwent: 1.  Amputation left great toe. 2.  I&D abscess left dorsal forefoot.  During dressing change today by podiatry, the left first toe was noted to be dusky in color. Worrisome for possible revision /  further amputation.  Vascular was consulted by Dr. Alberteen Timothy [name] for possible endovascular intervention.   Current Facility-Administered Medications  Medication Dose Route Frequency Provider Last Rate Last Admin  . 0.9 %  sodium chloride infusion   Intravenous Continuous Timothy Timothy [name], Timothy Timothy [name], Timothy Timothy [name] 100 mL/hr at 03/17/20 0356 New Bag at 03/17/20 0356  . acetaminophen (TYLENOL) tablet 650 mg  650 mg Oral Q6H PRN Timothy Timothy [name], Timothy Timothy [name], Timothy Timothy [name]       Or  . acetaminophen (TYLENOL) suppository 650 mg  650 mg Rectal Q6H PRN Timothy Timothy [name], Timothy Timothy [name], Timothy Timothy [name]      . albuterol (PROVENTIL) (2.5 MG/3ML) 0.083% nebulizer solution 2.5 mg  2.5 mg Nebulization Q2H PRN Timothy Timothy [name], Timothy Timothy [name], Timothy Timothy [name]       . amLODipine (NORVASC) tablet 5 mg  5 mg Oral Daily Timothy Timothy [name], Timothy Timothy [name], Timothy Timothy [name]   5 mg at 03/17/20 0905  . cefTRIAXone (ROCEPHIN) 2 g in sodium chloride 0.9 % 100 mL IVPB  2 g Intravenous Q24H Timothy Timothy [name], Timothy AlbrightJayashree, Timothy Timothy [name] 200 mL/hr at 03/17/20 1256 2 g at 03/17/20 1256  . citalopram (CELEXA) tablet 20 mg  20 mg Oral Daily Timothy Timothy [name], Timothy Timothy [name], Timothy Timothy [name]   20 mg at 03/17/20 0905  . cloNIDine (CATAPRES) tablet 0.1 mg  0.1 mg Oral QHS Timothy Timothy [name], Timothy Timothy [name], Timothy Timothy [name]   0.1 mg at 03/16/20 2121  . enoxaparin (LOVENOX) injection 40 mg  40 mg Subcutaneous Q24H Timothy Timothy [name], Timothy Timothy [name], Timothy Timothy [name]   40 mg at 03/16/20 2121  . gabapentin (NEURONTIN) capsule 300 mg  300 mg Oral BID Timothy Timothy [name], Timothy Timothy [name], Timothy Timothy [name]   300 mg at 03/17/20 [id number]  . hydrALAZINE (APRESOLINE) injection 10 mg  10 mg Intravenous Q4H PRN Timothy Timothy [name], Timothy Timothy [name], Timothy Timothy [name]      . insulin aspart (novoLOG) injection 0-15 Units  0-15 Units Subcutaneous TID WC Timothy Timothy [name], Timothy Timothy [name], Timothy Timothy [name]   3 Units at 03/17/20 1205  . insulin aspart (novoLOG) injection 0-5 Units  0-5 Units Subcutaneous QHS Timothy Timothy [name], Timothy Timothy [name], Timothy Timothy [name]      . living well with diabetes book MISC   Does not apply Once Timothy Timothy [name], Timothy Timothy [name], Timothy Timothy [name]      . mometasone-formoterol [facility](DULERA) 200-5 MCG/ACT inhaler 2 puff  2 puff Inhalation BID Timothy Timothy [name], Timothy Timothy [name], Timothy Timothy [name]   2 puff at 03/17/20 0906  . morphine 2 MG/ML injection 2 mg  2 mg  Intravenous Q4H PRN Timothy Timothy [name], Timothy Timothy [name]      . mupirocin ointment (BACTROBAN) 2 % 1 application  1 application Nasal BID Timothy Timothy [name], Timothy Timothy [name]   1 application at 03/17/20 1610  . nicotine (NICODERM CQ - dosed in mg/24 hours) patch 21 mg  21 mg Transdermal Daily Timothy Timothy [name], Timothy Timothy [name]   21 mg at 03/17/20 0908  . ondansetron (ZOFRAN) tablet 4 mg  4 mg Oral Q6H PRN Timothy Timothy [name], Timothy Timothy [name]       Or  . ondansetron [facility]) injection 4 mg  4 mg Intravenous Q6H PRN Timothy Timothy [name], Timothy Timothy [name]      . oxyCODONE (Oxy IR/ROXICODONE) immediate release tablet 5 mg  5 mg Oral Q4H PRN Timothy Timothy [name], Timothy Timothy [name]   5 mg at 03/17/20 1253  . pantoprazole (PROTONIX) EC tablet 40 mg  40 mg Oral Daily Timothy Timothy [name], Timothy Timothy [name]   40 mg at 03/17/20 0905  .  rosuvastatin (CRESTOR) tablet 5 mg  5 mg Oral Daily Timothy Timothy [name], Timothy Timothy [name]   5 mg at 03/17/20 0905  . senna-docusate (Senokot-S) tablet 1 tablet  1 tablet Oral QHS PRN Timothy Timothy [name], Timothy Timothy [name]      . traZODone (DESYREL) tablet 25 mg  25 mg Oral QHS PRN Timothy Timothy [name], Timothy Timothy [name]      . vancomycin (VANCOREADY) IVPB 1250 mg/250 mL  1,250 mg Intravenous Q12H Timothy Timothy [name], Timothy Timothy [name] 166.7 mL/hr at 03/17/20 0911 1,250 mg at 03/17/20 9604   Past Medical History:  Diagnosis Date  . Diabetes mellitus without complication (HCC)   . Hypercholesteremia   . Hypertension    Past Surgical History:  Procedure Laterality Date  . AMPUTATION TOE Left 03/16/2020   Procedure: AMPUTATION GREAT TOE;  Surgeon: Timothy Timothy [name], Timothy Timothy [name];  Location: [facility] ORS;  Service: Podiatry;  Laterality: Left;  . COLONOSCOPY WITH PROPOFOL N/A 12/22/2019   Procedure: COLONOSCOPY WITH PROPOFOL;  Surgeon: Timothy Timothy [name], Timothy Timothy [name];  Location: [facility] ENDOSCOPY;  Service: Endoscopy;  Laterality: N/A;  . TONSILLECTOMY    . WISDOM TOOTH EXTRACTION     Social History Social History   Tobacco Use  . Smoking status: Current Every Day Smoker    Packs/day: 1.50    Years: 41.00    Pack years: 61.50    Types: Cigarettes  . Smokeless tobacco: Never Used  Vaping Use  . Vaping Use: Never used  Substance Use Topics  . Alcohol use: Yes    Alcohol/week: 6.0 standard drinks    Types: 6 Cans of beer per week    Comment: occasionally   . Drug use: Not Currently   Family History History reviewed. No pertinent family history.  Denies family hx of PAD, venous or renal disease  Allergies  Allergen Reactions  . Bee Venom Anaphylaxis  . Coffee Flavor Shortness Of Breath  . Cheese Swelling    Blue Cheese only  . Penicillins Other (See Comments)    Unknown - remote reaction; "preteen". D/w patient recent use of Keflex and he does not recall having a reaction.   REVIEW OF SYSTEMS (Negative unless checked)  Constitutional: Weight loss  Fever  Chills Cardiac: Chest pain    Chest pressure   Palpitations   Shortness of breath when laying flat   Shortness of breath at rest   Shortness of breath with exertion. Vascular:  Pain in legs with walking   Pain in legs at rest   Pain in legs when laying flat   Claudication   Pain in feet when walking  Pain in feet at rest  Pain in  feet when laying flat   [] History of DVT   [] Phlebitis   [] Swelling in legs   [] Varicose veins   [] Non-healing ulcers Pulmonary:   [] Uses home oxygen   [] Productive cough   [] Hemoptysis   [] Wheeze  [] COPD   [] Asthma Neurologic:  [] Dizziness  [] Blackouts   [] Seizures   [] History of stroke   [] History of TIA  [] Aphasia   [] Temporary blindness   [] Dysphagia   [] Weakness or numbness in arms   [] Weakness or numbness in legs Musculoskeletal:  [] Arthritis   [] Joint swelling   [] Joint pain   [] Low back pain Hematologic:  [] Easy bruising  [] Easy bleeding   [] Hypercoagulable state   [] Anemic  [] Hepatitis Gastrointestinal:  [] Blood in stool   [] Vomiting blood  [] Gastroesophageal reflux/heartburn   [] Difficulty swallowing. Genitourinary:  [] Chronic kidney disease   [] Difficult urination  [] Frequent urination  [] Burning with urination   [] Blood in urine Skin:  [] Rashes   [x] Ulcers   [x] Wounds Psychological:  [] History of anxiety   []  History of major depression.  Physical Examination  Vitals:   03/17/20 0026 03/17/20 0359 03/17/20 0737 03/17/20 1134  BP: 129/82 (!) 149/88 (!) 149/93 (!) 162/88  Pulse: 67 73 69 73  Resp: 16 19 18 18   Temp: 98.8 F (37.1 C) 98.4 F (36.9 C) 98 F (36.7 C) 98.2 F (36.8 C)  TempSrc: Oral Oral Oral Oral  SpO2: 94% 94% 96% 95%  Weight:      Height:       Body mass index is 26.83 kg/m. Gen:  WD/WN, NAD Head: [employer]/AT, No temporalis wasting. Prominent temp pulse not noted. Ear/Nose/Throat: Hearing grossly intact, nares w/o erythema or drainage, oropharynx w/o Erythema/Exudate Eyes: Sclera non-icteric, conjunctiva clear Neck: Trachea midline.  No  JVD.  Pulmonary:  Good air movement, respirations not labored, equal bilaterally.  Cardiac: RRR, normal S1, S2. Vascular:  Vessel Right Left  Radial Palpable Palpable  Ulnar Palpable Palpable  Brachial Palpable Palpable  Carotid Palpable, without bruit Palpable, without bruit  Aorta Not palpable N/A  Femoral Palpable Palpable  Popliteal Palpable Palpable  PT Palpable Palpable  DP Palpable Palpable   Gastrointestinal: soft, non-tender/non-distended. No guarding/reflex.  Musculoskeletal: M/S 5/5 throughout.  Extremities without ischemic changes.  No deformity or atrophy. No edema. Neurologic: Sensation grossly intact in extremities.  Symmetrical.  Speech is fluent. Motor exam as listed above. Psychiatric: Judgment intact, Mood & affect appropriate for pt's clinical situation. Dermatologic: No rashes or ulcers noted.  No cellulitis or open wounds. Lymph : No Cervical, Axillary, or Inguinal lymphadenopathy.  CBC Lab Results  Component Value Date   WBC 8.0 03/16/2020   HGB 12.8 (L) 03/16/2020   HCT 36.2 (L) 03/16/2020   MCV 96.5 03/16/2020   PLT 273 03/16/2020   BMET    Component Value Date/Time   NA 133 (L) 03/16/2020 0555   NA 130 (L) 10/28/2019 1157   K 3.7 03/17/2020 0449   CL 95 (L) 03/16/2020 0555   CO2 26 03/16/2020 0555   GLUCOSE 225 (H) 03/16/2020 0555   BUN 13 03/16/2020 0555   BUN 13 10/28/2019 1157   CREATININE 0.75 03/17/2020 0449   CALCIUM 8.9 03/16/2020 0555   GFRNONAA >60 03/17/2020 0449   GFRAA >60 03/17/2020 0449   Estimated Creatinine Clearance: 103.9 mL/min (by C-G formula based on SCr of 0.75 mg/dL).  COAG Lab Results  Component Value Date   INR 0.9 03/16/2020   Radiology MR FOOT LEFT W WO CONTRAST  Result Date: 03/16/2020 CLINICAL DATA:  Left foot wound EXAM: MRI OF THE LEFT FOREFOOT WITHOUT AND WITH CONTRAST TECHNIQUE: Multiplanar, multisequence MR imaging of the left forefoot was performed both before and after administration of intravenous  contrast. CONTRAST:  15mL GADAVIST GADOBUTROL 1 MMOL/ML IV SOLN COMPARISON:  X-ray 03/15/2020 FINDINGS: Bones/Joint/Cartilage Loss of cortical definition of the distal tuft of the great toe distal phalanx. There is marrow edema throughout the distal phalanx with associated confluent low T1 signal compatible with acute osteomyelitis. Minimal subchondral bone marrow edema within the great toe proximal phalanx adjacent to the IP joint with preservation of the fatty T1 bone marrow signal. Remaining osseous structures of the forefoot are normal in appearance and signal. No acute fracture. No malalignment. Trace first MTP joint effusion. Ligaments Intact Lisfranc ligament. Collateral ligaments of the forefoot intact. Muscles and Tendons Edema-like signal throughout the intrinsic foot musculature suggesting a nonspecific myositis. Intact flexor and extensor tendons. No significant tenosynovial fluid collection peer Soft tissues There is fluid and scattered foci of susceptibility within the subungual region of the great toe. Additional fluid collection is present along the plantar surface of the great toe distal phalanx measuring 1.4 x 0.9 x 1.6 cm (series 8, image 11; series 5, image 8). There is surrounding soft tissue edema at the level of the great toe. Mild diffuse dorsal subcutaneous edema of the forefoot. IMPRESSION: 1. Acute osteomyelitis of the distal tuft of the left great toe distal phalanx. 2. Fluid and scattered foci of susceptibility within the subungual region of the great toe. Additional fluid collection along the plantar surface of the great toe distal phalanx measuring 1.4 x 0.9 x 1.6 cm, which may represent a small abscesses or phlegmonous change. 3. Minimal subchondral bone marrow edema within the great toe proximal phalanx adjacent to the IP joint with preservation of the fatty T1 bone marrow signal. Findings may be reactive. Early osteomyelitis not excluded. 4. Edema-like signal throughout the intrinsic  foot musculature suggesting a nonspecific myositis. These findings are in agreement with the preliminary report issued by Timothy Curie, Timothy Timothy [name]. The preliminary findings were called to Timothy Silversmith, NP at 9:32 p.m. on 03/15/2020 by the professional radiology assistant. Electronically Signed   By: Duanne Guess D.O.   On: 03/16/2020 08:13   DG Chest Port 1 View  Result Date: 03/15/2020 CLINICAL DATA:  Cough. Wound infection. Diabetes. Hypertension. Smoker. EXAM: PORTABLE CHEST 1 VIEW COMPARISON:  06/17/2019 FINDINGS: Midline trachea. Normal heart size. Atherosclerosis in the transverse aorta. No pleural effusion or pneumothorax. Mild pulmonary interstitial thickening is likely related to smoking/chronic bronchitis. The right suprahilar airspace disease has resolved since the prior radiograph. IMPRESSION: No acute cardiopulmonary disease. Aortic Atherosclerosis (ICD10-I70.0). Electronically Signed   By: Jeronimo Greaves M.D.   On: 03/15/2020 17:57   DG Foot Complete Left  Result Date: 03/15/2020 Please see detailed radiograph report in office Timothy [name].  Assessment/Plan Ramere Downs. is a 59 year old male with medical history significant of hypertension, type 2 diabetes mellitus, hypercholesterolemia, tobacco abuse, depression/anxiety who presents to the ED for evaluation of a infection and his left hallux.   1. 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 Now with dusky appearance.  Patient with multiple risk factors for peripheral artery disease.  In setting of multiple risk factors and possible failure recent amputation site recommend undergoing a left lower extremity angiogram with possible intervention and attempt assess patient's anatomy contributing degree of atherosclerotic disease.  If appropriate an attempt to revascularize leg can be  at that time.  Procedure, risks and benefits explained to the patient.  All questions were answered.  Patient wished to proceed.  2.   Diabetes On appropriate medications Encouraged good control as its slows the progression of atherosclerotic disease  3. Tobacco Abuse: We had a discussion for approximately three minutes regarding the absolute need for smoking cessation due to the deleterious nature of tobacco on the vascular system. We discussed the tobacco use would diminish patency of any intervention, and likely significantly worsen progressio of disease. We discussed multiple agents for quitting including replacement therapy or medications to reduce cravings such as Chantix. The patient voices their understanding of the importance of smoking cessation.  Discussed with Dr. Romie Jumper, PA-C  03/17/2020 12:57 PM  This Timothy [name] was created with Dragon medical transcription system.  Any error is purely unintentional

## 2020-03-17 NOTE — Consult Note (Addendum)
NAME: Timothy [name].  DOB: 09-08-1960  MRN: [id number]  Date/Time: 03/17/2020 11:01 AM  REQUESTING PROVIDER: Kurtis Bushman Subjective:  REASON FOR CONSULT: left toe infection ? Timothy [name]. is a 59 y.o. with a history of DM , HTn , hypercholesterolemia , smoker presented from podiatrist for infected left hallux. Pt has had an ulcer for a few days and it got worse in 24 hts- he [facility]/28 and was asked ot go to the ED for admission, IV antibiotics and MRI Vitals were normal on admission with temp 98.3, BP 121/70, RR 16 and HR 86 Wbc 9.9, HB 13.3, plt 308, ESR 95, was started on meropenem and vanco. MRI showed acute osteo of the left great toe tuft and he underwent amputation of the left great toe and I/D of the abscess left dorsal fore foot by Dr.Cline on 03/16/20- I am seeing the patient for the same As per patient he does not have allergy to PCN but overdosed on it as a teen. HE has taken amoxicillin with no issues Past Medical History:  Diagnosis Date  . Diabetes mellitus without complication ([place])   . Hypercholesteremia   . Hypertension     Past Surgical History:  Procedure Laterality Date  . AMPUTATION TOE Left 03/16/2020   Procedure: AMPUTATION GREAT TOE;  Surgeon: [name], [name];  Location: [facility] ORS;  Service: Podiatry;  Laterality: Left;  . COLONOSCOPY WITH PROPOFOL N/A 12/22/2019   Procedure: COLONOSCOPY WITH PROPOFOL;  Surgeon: [name], MD;  Location: [facility] ENDOSCOPY;  Service: Endoscopy;  Laterality: N/A;  . TONSILLECTOMY    . WISDOM TOOTH EXTRACTION      Social History   Socioeconomic History  . Marital status: Single    Spouse name: Not on file  . Number of children: Not on file  . Years of education: Not on file  . Highest education level: Not on file  Occupational History  . Not on file  Tobacco Use  . Smoking status: Current Every Day Smoker    Packs/day: 1.50    Years: 41.00    Pack years: 61.50    Types: Cigarettes  . Smokeless  tobacco: Never Used  Vaping Use  . Vaping Use: Never used  Substance and Sexual Activity  . Alcohol use: Yes    Alcohol/week: 6.0 standard drinks    Types: 6 Cans of beer per week    Comment: occasionally   . Drug use: Not Currently  . Sexual activity: Not Currently  Other Topics Concern  . Not on file  Social History Narrative  . Not on file   Social Determinants of Health   Financial Resource Strain: Medium Risk  . Difficulty of Paying Living Expenses: Somewhat hard  Food Insecurity: No Food Insecurity  . Worried About Charity fundraiser in the Last Year: Never true  . Ran Out of Food in the Last Year: Never true  Transportation Needs: No Transportation Needs  . Lack of Transportation (Medical): No  . Lack of Transportation (Non-Medical): No  Physical Activity: Inactive  . Days of Exercise per Week: 0 days  . Minutes of Exercise per Session: 0 min  Stress: Stress Concern Present  . Feeling of Stress : Rather much  Social Connections: Unknown  . Frequency of Communication with Friends and Family: Three times a week  . Frequency of Social Gatherings with Friends and Family: Never  . Attends Religious Services: Never  . Active Member of Clubs or Organizations: No  .  Attends Archivist Meetings: Never  . Marital Status: Not on file  Intimate Partner Violence: Not At Risk  . Fear of Current or Ex-Partner: No  . Emotionally Abused: No  . Physically Abused: No  . Sexually Abused: No    History reviewed. No pertinent family history. Allergies  Allergen Reactions  . Bee Venom Anaphylaxis  . Coffee Flavor Shortness Of Breath  . Cheese Swelling    Blue Cheese only  . Penicillins Other (See Comments)    Unknown - remote reaction; "preteen". D/w patient recent use of Keflex and he does not recall having a reaction.    ? Current Facility-Administered Medications  Medication Dose Route Frequency Provider Last Rate Last Admin  . 0.9 %  sodium chloride infusion    Intravenous Continuous [name], [name] 100 mL/hr at 03/17/20 0356 New Bag at 03/17/20 0356  . acetaminophen (TYLENOL) tablet 650 mg  650 mg Oral Q6H PRN [name], [name]       Or  . acetaminophen (TYLENOL) suppository 650 mg  650 mg Rectal Q6H PRN [name], [name]      . albuterol (PROVENTIL) (2.5 MG/3ML) 0.083% nebulizer solution 2.5 mg  2.5 mg Nebulization Q2H PRN [name], [name]      . amLODipine (NORVASC) tablet 5 mg  5 mg Oral Daily [name], [name]   5 mg at 03/17/20 0905  . citalopram (CELEXA) tablet 20 mg  20 mg Oral Daily [name], [name]   20 mg at 03/17/20 0905  . cloNIDine (CATAPRES) tablet 0.1 mg  0.1 mg Oral QHS [name], [name]   0.1 mg at 03/16/20 2121  . enoxaparin (LOVENOX) injection 40 mg  40 mg Subcutaneous Q24H [name], [name]   40 mg at 03/16/20 2121  . gabapentin (NEURONTIN) capsule 300 mg  300 mg Oral BID [name], [name]   300 mg at 03/17/20 9407  . hydrALAZINE (APRESOLINE) injection 10 mg  10 mg Intravenous Q4H PRN [name], [name]      . insulin aspart (novoLOG) injection 0-15 Units  0-15 Units Subcutaneous TID WC [name], [name]   3 Units at 03/17/20 6808  . insulin aspart (novoLOG) injection 0-5 Units  0-5 Units Subcutaneous QHS [name], [name]      . living well with diabetes book MISC   Does not apply Once [name], [name]      . mometasone-formoterol [facility]) 200-5 MCG/ACT inhaler 2 puff  2 puff Inhalation BID [name], [name]   2 puff at 03/17/20 0906  . morphine 2 MG/ML injection 2 mg  2 mg Intravenous Q4H PRN [name], [name]      . mupirocin ointment (BACTROBAN) 2 % 1 application  1 application Nasal BID [name], [name]   1 application at 81/10/31 5945  . nicotine (NICODERM CQ - dosed in mg/24 hours) patch 21 mg  21 mg Transdermal Daily [name], [name]   21 mg at 03/17/20 0908  . ondansetron (ZOFRAN) tablet 4 mg  4 mg Oral Q6H PRN [name], [name]       Or  . ondansetron [facility]) injection 4 mg  4 mg Intravenous Q6H PRN [name], [name]      . oxyCODONE  (Oxy IR/ROXICODONE) immediate release tablet 5 mg  5 mg Oral Q4H PRN [name], [name]   5 mg at 03/17/20 0355  . pantoprazole (PROTONIX) EC tablet 40 mg  40 mg Oral Daily [name], [name]   40 mg at 03/17/20  7096  . rosuvastatin (CRESTOR) tablet 5 mg  5 mg Oral Daily [name], [name]   5 mg at 03/17/20 0905  . senna-docusate (Senokot-S) tablet 1 tablet  1 tablet Oral QHS PRN [name], [name]      . traZODone (DESYREL) tablet 25 mg  25 mg Oral QHS PRN [name], [name]      . vancomycin (VANCOREADY) IVPB 1250 mg/250 mL  1,250 mg Intravenous Q12H [name], [name] 166.7 mL/hr at 03/17/20 0911 1,250 mg at 03/17/20 0911     Abtx:  Anti-infectives (From admission, onward)   Start     Dose/Rate Route Frequency Ordered Stop   03/16/20 2000  vancomycin (VANCOREADY) IVPB 1250 mg/250 mL        1,250 mg 166.7 mL/hr over 90 Minutes Intravenous Every 12 hours 03/16/20 1311     03/16/20 0600  vancomycin (VANCOCIN) IVPB 1000 mg/200 mL premix  Status:  Discontinued        1,000 mg 200 mL/hr over 60 Minutes Intravenous Every 8 hours 03/15/20 1726 03/16/20 1311   03/15/20 1800  vancomycin (VANCOCIN) IVPB 1000 mg/200 mL premix        1,000 mg 200 mL/hr over 60 Minutes Intravenous  Once 03/15/20 1725 03/15/20 2103   03/15/20 1600  vancomycin (VANCOCIN) IVPB 1000 mg/200 mL premix        1,000 mg 200 mL/hr over 60 Minutes Intravenous  Once 03/15/20 1554 03/15/20 1823   03/15/20 1600  meropenem (MERREM) 1 g in sodium chloride 0.9 % 100 mL IVPB  Status:  Discontinued        1 g 200 mL/hr over 30 Minutes Intravenous Every 8 hours 03/15/20 1554 03/16/20 1301      REVIEW OF SYSTEMS:  Const: negative fever, + chills, negative weight loss Eyes: negative diplopia or visual changes, negative eye pain ENT: negative coryza, negative sore throat Resp: negative cough, hemoptysis, dyspnea Cards: negative for chest pain, palpitations, lower extremity edema GU: negative for frequency, dysuria and hematuria GI: Negative  for abdominal pain, diarrhea, bleeding, constipation Skin: negative for rash and pruritus Heme: negative for easy bruising and gum/nose bleeding MS: negative for myalgias, arthralgias, back pain and muscle weakness Neurolo:negative for headaches, dizziness, vertigo, memory problems  Psych: negative for feelings of anxiety, depression  Endocrine:  Diabetes not well controlled Allergy/Immunology- as above: Objective:  VITALS:  BP (!) 149/93 (BP Location: Right Arm)   Pulse 69   Temp 98 F (36.7 C) (Oral)   Resp 18   Ht _0  (1.778 m)   Wt 84.8 kg   SpO2 96%   BMI 26.83 kg/m  PHYSICAL EXAM:  General: Alert, cooperative, no distress, appears stated age.  Head: Normocephalic, without obvious abnormality, atraumatic. Eyes: Conjunctivae clear, anicteric sclerae. Pupils are equal ENT Nares normal. No drainage or sinus tenderness. Lips, mucosa, and tongue normal. No Thrush, poor denition Neck: Supple, symmetrical, no adenopathy, thyroid: non tender no carotid bruit and no JVD. Back: No CVA tenderness. Lungs: Clear to auscultation bilaterally. No Wheezing or Rhonchi. No rales. Heart: Regular rate and rhythm, no murmur, rub or gallop. Abdomen: Soft, non-tender,not distended. Bowel sounds normal. No masses Extremities:  Left great toe- partial amputation- has ischemia and some necrosis      Skin: No rashes or lesions. Or bruising Lymph: Cervical, supraclavicular normal. Neurologic: Grossly non-focal Pertinent Labs Lab Results CBC    Component Value Date/Time   WBC 8.0 03/16/2020 0555   RBC 3.75 (L) 03/16/2020 0555   HGB  12.8 (L) 03/16/2020 0555   HGB 16.3 10/28/2019 1157   HCT 36.2 (L) 03/16/2020 0555   HCT 46.8 10/28/2019 1157   PLT 273 03/16/2020 0555   PLT 293 10/28/2019 1157   MCV 96.5 03/16/2020 0555   MCV 95 10/28/2019 1157   MCH 34.1 (H) 03/16/2020 0555   MCHC 35.4 03/16/2020 0555   RDW 12.5 03/16/2020 0555   RDW 12.6 10/28/2019 1157   LYMPHSABS 1.7  03/15/2020 1531   LYMPHSABS 1.9 10/28/2019 1157   MONOABS 1.0 03/15/2020 1531   EOSABS 0.2 03/15/2020 1531   EOSABS 0.3 10/28/2019 1157   BASOSABS 0.1 03/15/2020 1531   BASOSABS 0.1 10/28/2019 1157    CMP Latest Ref Rng & Units 03/17/2020 03/16/2020 03/15/2020  Glucose 70 - 99 mg/dL - 225(H) 236(H)  BUN 6 - 20 mg/dL - 13 17  Creatinine 0.61 - 1.24 mg/dL 0.75 0.79 0.77  Sodium 135 - 145 mmol/L - 133(L) 132(L)  Potassium 3.5 - 5.1 mmol/L 3.7 3.5 3.7  Chloride 98 - 111 mmol/L - 95(L) 97(L)  CO2 22 - 32 mmol/L - 26 24  Calcium 8.9 - 10.3 mg/dL - 8.9 9.1  Total Protein 6.5 - 8.1 g/dL - 7.4 7.9  Total Bilirubin 0.3 - 1.2 mg/dL - 0.7 0.7  Alkaline Phos 38 - 126 U/L - 92 99  AST 15 - 41 U/L - 17 21  ALT 0 - 44 U/L - 18 21      Microbiology: Recent Results (from the past 240 hour(s))  Respiratory Panel by RT PCR (Flu A&B, Covid) - Nasopharyngeal Swab     Status: None   Collection Time: 03/15/20  4:55 PM   Specimen: Nasopharyngeal Swab  Result Value Ref Range Status   SARS Coronavirus 2 by RT PCR NEGATIVE NEGATIVE Final    Comment: (NOTE) SARS-CoV-2 target nucleic acids are NOT DETECTED.  The SARS-CoV-2 RNA is generally detectable in upper respiratoy specimens during the acute phase of infection. The lowest concentration of SARS-CoV-2 viral copies this assay can detect is 131 copies/mL. A negative result does not preclude SARS-Cov-2 infection and should not be used as the sole basis for treatment or other patient management decisions. A negative result may occur with  improper specimen collection/handling, submission of specimen other than nasopharyngeal swab, presence of viral mutation(s) within the areas targeted by this assay, and inadequate number of viral copies (<131 copies/mL). A negative result must be combined with clinical observations, patient history, and epidemiological information. The expected result is Negative.  Fact Sheet for Patients:   PinkCheek.be  Fact Sheet for Healthcare Providers:  GravelBags.it  This test is no t yet approved or cleared by the [place] FDA and  has been authorized for detection and/or diagnosis of SARS-CoV-2 by FDA under an Emergency Use Authorization (EUA). This EUA will remain  in effect (meaning this test can be used) for the duration of the COVID-19 declaration under Section 564(b)(1) of the Act, 21 U.S.C. section 360bbb-3(b)(1), unless the authorization is terminated or revoked sooner.     Influenza A by PCR NEGATIVE NEGATIVE Final   Influenza B by PCR NEGATIVE NEGATIVE Final    Comment: (NOTE) The Xpert Xpress SARS-CoV-2/FLU/RSV assay is intended as an aid in  the diagnosis of influenza from Nasopharyngeal swab specimens and  should not be used as a sole basis for treatment. Nasal washings and  aspirates are unacceptable for Xpert Xpress SARS-CoV-2/FLU/RSV  testing.  Fact Sheet for Patients: PinkCheek.be  Fact Sheet for Healthcare Providers:  GravelBags.it  This test is not yet approved or cleared by the [place] and  has been authorized for detection and/or diagnosis of SARS-CoV-2 by  FDA under an Emergency Use Authorization (EUA). This EUA will remain  in effect (meaning this test can be used) for the duration of the  Covid-19 declaration under Section 564(b)(1) of the Act, 21  U.S.C. section 360bbb-3(b)(1), unless the authorization is  terminated or revoked. Performed at [facility], [address] [place], [place] [address]   Aerobic/Anaerobic Culture (surgical/deep wound)     Status: None (Preliminary result)   Collection Time: 03/16/20  5:43 PM   Specimen: Bone; Tissue  Result Value Ref Range Status   Specimen Description   Final    BONE Performed at [facility], [address], [place], [place] [address]    Special  Requests   Final    NONE Performed at [facility], [address] [place], [place] [address]    Gram Stain PENDING  Incomplete   Culture   Final    CULTURE REINCUBATED FOR BETTER GROWTH Performed at [facility], [place] [address], [place], [place] [address]    Report Status PENDING  Incomplete    IMAGING RESULTS: MRI reviewed- see result above I have personally reviewed the films ? Impression/Recommendation ? ?Left toe infection with acute osteomyelitis- s/p partial amputation- residual ischemia/necrosis-  GBS in culture so far Dc vanco - continue, ceftriaxone  Vascular is being consulted to assess circulation  Smoker  Diabetes mellitus- not well controlled  Hba1c > 8 On insulin in the hospital  HTN- on amlodipine clonidine  Hyperlipidemia- on rosuvstatin  ? ___________________________________________________ Discussed with patient,Pt seen with Dr.Cline Note:  This document was prepared using Dragon voice recognition software and may include unintentional dictation errors.

## 2020-03-18 ENCOUNTER — Encounter
Admission: EM | Disposition: A | Discharge status: Home / Self Care | Payer: Self-pay | Source: Home / Self Care | Attending: Internal Medicine

## 2020-03-18 DIAGNOSIS — I70262 Atherosclerosis of native arteries of extremities with gangrene, left leg: Secondary | ICD-10-CM

## 2020-03-18 DIAGNOSIS — I70201 Unspecified atherosclerosis of native arteries of extremities, right leg: Secondary | ICD-10-CM

## 2020-03-18 DIAGNOSIS — I998 Other disorder of circulatory system: Secondary | ICD-10-CM

## 2020-03-18 HISTORY — PX: LOWER EXTREMITY ANGIOGRAPHY: CATH118251

## 2020-03-18 LAB — GLUCOSE, CAPILLARY
Glucose-Capillary: 180 mg/dL — ABNORMAL HIGH (ref 70–99)
Glucose-Capillary: 173 mg/dL — ABNORMAL HIGH (ref 70–99)
Glucose-Capillary: 164 mg/dL — ABNORMAL HIGH (ref 70–99)
Glucose-Capillary: 102 mg/dL — ABNORMAL HIGH (ref 70–99)
Glucose-Capillary: 107 mg/dL — ABNORMAL HIGH (ref 70–99)

## 2020-03-18 LAB — BASIC METABOLIC PANEL
Anion gap: 9 (ref 5–15)
BUN: 9 mg/dL (ref 6–20)
CO2: 25 mmol/L (ref 22–32)
Calcium: 8.5 mg/dL — ABNORMAL LOW (ref 8.9–10.3)
Chloride: 100 mmol/L (ref 98–111)
Creatinine, Ser: 0.61 mg/dL (ref 0.61–1.24)
GFR calc Af Amer: >60 mL/min (ref >60)
GFR calc non Af Amer: >60 mL/min (ref >60)
Glucose, Bld: 176 mg/dL — ABNORMAL HIGH (ref 70–99)
Potassium: 3.7 mmol/L (ref 3.5–5.1)
Sodium: 134 mmol/L — ABNORMAL LOW (ref 135–145)

## 2020-03-18 LAB — SURGICAL PATHOLOGY

## 2020-03-18 SURGERY — LOWER EXTREMITY ANGIOGRAPHY
Anesthesia: Moderate Sedation | Laterality: Left

## 2020-03-18 MED ORDER — SODIUM CHLORIDE 0.9% FLUSH
3.0000 mL | Freq: Two times a day (BID) | INTRAVENOUS | Status: DC
  Administered 2020-03-18 – 2020-03-23 (×7): 3 mL via INTRAVENOUS

## 2020-03-18 MED ORDER — CLOPIDOGREL BISULFATE 75 MG PO TABS
ORAL_TABLET | ORAL | Status: AC
  Filled 2020-03-18: qty 4

## 2020-03-18 MED ORDER — MIDAZOLAM HCL 5 MG/5ML IJ SOLN
INTRAMUSCULAR | Status: AC
  Filled 2020-03-18: qty 5

## 2020-03-18 MED ORDER — SODIUM CHLORIDE 0.9 % IV SOLN
3.0000 g | Freq: Four times a day (QID) | INTRAVENOUS | Status: DC
  Administered 2020-03-18 – 2020-03-23 (×20): 3 g via INTRAVENOUS
  Filled 2020-03-18 (×3): qty 3
  Filled 2020-03-18 (×2): qty 8
  Filled 2020-03-18 (×3): qty 3
  Filled 2020-03-18: qty 8
  Filled 2020-03-18 (×3): qty 3
  Filled 2020-03-18 (×2): qty 8
  Filled 2020-03-18 (×7): qty 3
  Filled 2020-03-18: qty 8
  Filled 2020-03-18 (×3): qty 3

## 2020-03-18 MED ORDER — IODIXANOL 320 MG/ML IV SOLN
INTRAVENOUS | Status: DC | PRN
  Administered 2020-03-18: 95 mL

## 2020-03-18 MED ORDER — HYDROMORPHONE HCL 1 MG/ML IJ SOLN
1.0000 mg | Freq: Once | INTRAMUSCULAR | Status: AC | PRN
  Administered 2020-03-22: 1 mg via INTRAVENOUS
  Filled 2020-03-18: qty 1

## 2020-03-18 MED ORDER — CLOPIDOGREL BISULFATE 75 MG PO TABS
75.0000 mg | ORAL_TABLET | Freq: Every day | ORAL | Status: DC
  Administered 2020-03-19 – 2020-03-23 (×4): 75 mg via ORAL
  Filled 2020-03-18 (×4): qty 1

## 2020-03-18 MED ORDER — SODIUM CHLORIDE 0.9 % IV SOLN: 250.0000 mL | INTRAVENOUS | Status: DC | PRN

## 2020-03-18 MED ORDER — ACETAMINOPHEN 325 MG PO TABS
650.0000 mg | ORAL_TABLET | ORAL | Status: DC | PRN
  Administered 2020-03-20 – 2020-03-21 (×2): 650 mg via ORAL
  Filled 2020-03-18 (×4): qty 2

## 2020-03-18 MED ORDER — MIDAZOLAM HCL 2 MG/2ML IJ SOLN
INTRAMUSCULAR | Status: DC | PRN
  Administered 2020-03-18: 2 mg via INTRAVENOUS
  Administered 2020-03-18 (×2): 1 mg via INTRAVENOUS

## 2020-03-18 MED ORDER — SODIUM CHLORIDE 0.9 % IV SOLN: INTRAVENOUS | Status: DC

## 2020-03-18 MED ORDER — DIPHENHYDRAMINE HCL 50 MG/ML IJ SOLN: 50.0000 mg | Freq: Once | INTRAMUSCULAR | Status: DC | PRN

## 2020-03-18 MED ORDER — FENTANYL CITRATE (PF) 100 MCG/2ML IJ SOLN
INTRAMUSCULAR | Status: AC
  Filled 2020-03-18: qty 2

## 2020-03-18 MED ORDER — ONDANSETRON HCL 4 MG/2ML IJ SOLN: 4.0000 mg | Freq: Four times a day (QID) | INTRAMUSCULAR | Status: DC | PRN

## 2020-03-18 MED ORDER — HEPARIN SODIUM (PORCINE) 1000 UNIT/ML IJ SOLN
INTRAMUSCULAR | Status: DC | PRN
  Administered 2020-03-18: 5000 [IU] via INTRAVENOUS

## 2020-03-18 MED ORDER — MIDAZOLAM HCL 2 MG/ML PO SYRP: 8.0000 mg | ORAL_SOLUTION | Freq: Once | ORAL | Status: DC | PRN

## 2020-03-18 MED ORDER — CLINDAMYCIN PHOSPHATE 300 MG/50ML IV SOLN
INTRAVENOUS | Status: AC
  Administered 2020-03-18: 300 mg
  Filled 2020-03-18: qty 50

## 2020-03-18 MED ORDER — METHYLPREDNISOLONE SODIUM SUCC 125 MG IJ SOLR: 125.0000 mg | Freq: Once | INTRAMUSCULAR | Status: DC | PRN

## 2020-03-18 MED ORDER — CLOPIDOGREL BISULFATE 75 MG PO TABS
300.0000 mg | ORAL_TABLET | Freq: Once | ORAL | Status: AC
  Administered 2020-03-18: 300 mg via ORAL

## 2020-03-18 MED ORDER — FAMOTIDINE 20 MG PO TABS
40.0000 mg | ORAL_TABLET | Freq: Once | ORAL | Status: AC | PRN
  Administered 2020-03-18: 40 mg via ORAL

## 2020-03-18 MED ORDER — SODIUM CHLORIDE 0.9% FLUSH: 3.0000 mL | INTRAVENOUS | Status: DC | PRN

## 2020-03-18 MED ORDER — FAMOTIDINE 20 MG PO TABS
ORAL_TABLET | ORAL | Status: AC
  Filled 2020-03-18: qty 1

## 2020-03-18 MED ORDER — OXYCODONE HCL 5 MG PO TABS
5.0000 mg | ORAL_TABLET | ORAL | Status: DC | PRN
  Administered 2020-03-18: 10 mg via ORAL
  Filled 2020-03-18 (×2): qty 1
  Filled 2020-03-18 (×2): qty 2

## 2020-03-18 MED ORDER — HEPARIN SODIUM (PORCINE) 1000 UNIT/ML IJ SOLN
INTRAMUSCULAR | Status: AC
  Filled 2020-03-18: qty 1

## 2020-03-18 MED ORDER — SODIUM CHLORIDE 0.9 % IV SOLN: INTRAVENOUS | Status: AC

## 2020-03-18 MED ORDER — MORPHINE SULFATE (PF) 4 MG/ML IV SOLN: 2.0000 mg | INTRAVENOUS | Status: DC | PRN

## 2020-03-18 MED ORDER — CLINDAMYCIN PHOSPHATE 300 MG/50ML IV SOLN: 300.0000 mg | Freq: Once | INTRAVENOUS | Status: DC

## 2020-03-18 MED ORDER — FENTANYL CITRATE (PF) 100 MCG/2ML IJ SOLN
INTRAMUSCULAR | Status: DC | PRN
Start: 2020-03-18 — End: 2020-03-18
  Administered 2020-03-18: 50 ug via INTRAVENOUS
  Administered 2020-03-18: 25 ug via INTRAVENOUS
  Administered 2020-03-18: 50 ug via INTRAVENOUS

## 2020-03-18 SURGICAL SUPPLY — 22 items
BALLN LUTONIX 4X150X130 (BALLOONS) ×3
BALLN LUTONIX DCB 5X100X130 (BALLOONS) ×3
BALLN LUTONIX DCB 5X60X130 (BALLOONS) ×3
BALLN LUTONIX DCB 6X60X130 (BALLOONS) ×3
BALLOON LUTONIX 4X150X130 (BALLOONS) IMPLANT
BALLOON LUTONIX DCB 5X100X130 (BALLOONS) IMPLANT
BALLOON LUTONIX DCB 5X60X130 (BALLOONS) IMPLANT
BALLOON LUTONIX DCB 6X60X130 (BALLOONS) IMPLANT
CATH ANGIO 5F PIGTAIL 65CM (CATHETERS) ×2 IMPLANT
DEVICE STARCLOSE SE CLOSURE (Vascular Products) ×2 IMPLANT
GLIDEWIRE ADV .035X260CM (WIRE) ×2 IMPLANT
KIT ENCORE 26 ADVANTAGE (KITS) ×4 IMPLANT
NDL ENTRY 21GA 7CM ECHOTIP (NEEDLE) IMPLANT
NEEDLE ENTRY 21GA 7CM ECHOTIP (NEEDLE) ×3 IMPLANT
PACK ANGIOGRAPHY (CUSTOM PROCEDURE TRAY) ×3 IMPLANT
SET INTRO CAPELLA COAXIAL (SET/KITS/TRAYS/PACK) ×2 IMPLANT
SHEATH BRITE TIP 5FRX11 (SHEATH) ×2 IMPLANT
SHEATH RAABE 6FR (SHEATH) ×2 IMPLANT
STENT LIFESTENT 5F 6X60X135 (Permanent Stent) ×2 IMPLANT
SYR MEDRAD MARK 7 150ML (SYRINGE) ×2 IMPLANT
TUBING CONTRAST HIGH PRESS 72 (TUBING) ×4 IMPLANT
WIRE J 3MM .035X145CM (WIRE) ×2 IMPLANT

## 2020-03-18 NOTE — Interval H&P Note (Signed)
History and Physical Interval Note:  03/18/2020 9:44 AM  Timothy [name].  has presented today for surgery, with the diagnosis of Atherosclerotic Disease With Ulceration.  The various methods of treatment have been discussed with the patient and family. After consideration of risks, benefits and other options for treatment, the patient has consented to  Procedure(s): Lower Extremity Angiography (Left) as a surgical intervention.  The patient's history has been reviewed, patient examined, no change in status, stable for surgery.  I have reviewed the patient's chart and labs.  Questions were answered to the patient's satisfaction.     [name]

## 2020-03-18 NOTE — Progress Notes (Signed)
Pt. Arrived from floor "agitated". Pt. Requested male RN to prep bilat. Groins. Pt. Allowed Male RN Shawn from cath lab to prep /shave groins. Pt. States "the only woman to ever see my groins was my mother & I don't want anyone else seeing me."

## 2020-03-18 NOTE — Op Note (Signed)
[facility] Percutaneous Study/Intervention Procedural Note   Date of Surgery: 03/18/2020  Surgeon:  [name], MD.  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 Introduction catheter into left lower extremity 3rd order catheter placement  2. Contrast injection left lower extremity for distal runoff   3. Percutaneous transluminal angioplasty and stent placement left superficial femoral artery. 4. Percutaneous transluminal angioplasty of the left peroneal and tibioperoneal trunk to 4 mm with a Lutonix drug-eluting balloon              5.  Star close closure right common femoral arteriotomy  Anesthesia: Conscious sedation was administered under my direct supervision by the interventional radiology RN. IV Versed plus fentanyl were utilized. Continuous ECG, pulse oximetry and blood pressure was monitored throughout the entire procedure.  Conscious sedation was for a total of 50 minutes 25 seconds.  Sheath: 6 French Raby  Contrast: 95 cc  Fluoroscopy Time: 5.7 minutes  Indications: Timothy [name]. presents with gangrene of the left forefoot.  Pedal pulses are nonpalpable.  He is undergoing angiography with hope for intervention for limb salvage.  The risks and benefits are reviewed all questions answered patient agrees to proceed.  Procedure: Timothy [name]. is a 59 y.o. y.o. male who was identified and appropriate procedural time out was performed. The patient was then placed supine on the table and prepped and draped in the usual sterile fashion.   Ultrasound was placed in the sterile sleeve and the right groin was evaluated the right common femoral artery was echolucent and pulsatile indicating patency.  Image was recorded for the permanent record and  under real-time visualization a microneedle was inserted into the common femoral artery microwire followed by a micro-sheath.  A J-wire was then advanced through the micro-sheath and a  5 [place] sheath was then inserted over a J-wire. J-wire was then advanced and a 5 French pigtail catheter was positioned at the level of T12. AP projection of the aorta was then obtained. Pigtail catheter was repositioned to above the bifurcation and a RAO and LAO view of the pelvis was obtained.  Subsequently a pigtail catheter with the stiff angle Glidewire was used to cross the aortic bifurcation the catheter wire were advanced down into the left distal external iliac artery. Oblique view of the femoral bifurcation was then obtained and subsequently the wire was reintroduced and the pigtail catheter negotiated into the SFA representing third order catheter placement. Distal runoff was then performed.  Diagnostic interpretation: The abdominal aorta is opacified the post injection contrast.  Is widely patent.  No evidence of hemodynamically significant stenoses.  There single renal arteries noted no evidence of renal artery stenosis.  Normal nephrograms noted bilaterally.  The aortic bifurcation is widely patent as is the common internal and external iliac arteries bilaterally.  The left common femoral profunda femoris is widely patent.  The SFA is patent proximally although there is mild atherosclerotic changes.  At the level of Hunter's canal there is a focal greater than 70% stenosis.  The proximal popliteal is widely patent however in the midportion of the popliteal extending into the distal portion there is a greater than 80% stenosis.  The trifurcation is heavily diseased with occlusion of the anterior tibial shortly after its origin.  There is reconstitution of the anterior tibial which is patent down to the ankle.  The dorsalis pedis appears  to occlude proximally and remains occluded through its proximal half.  It is  reconstituted by collaterals distally.  The tibioperoneal trunk demonstrates diffuse greater than 70% stenosis.  The origin of the peroneal is included in the 70% stenosis beyond this the peroneal is widely patent and the dominant runoff to the foot.  Posterior tibial is nonvisualized from its origin all the way down to the ankle.  The peroneal provides large collaterals refilling the plantar arteries.  Based on these findings I elected to proceed with intervention.  5000 units of heparin was then given and allowed to circulate and a 6 [place] Raby sheath was advanced up and over the bifurcation and positioned in the femoral artery  KMP  catheter and stiff angle Glidewire were then negotiated down into the distal popliteal.  The catheter is removed and a 5 mm x 150 mm Lutonix drug-eluting balloon balloon was used to angioplasty the superficial femoral artery. Inflations were to 10 atmospheres for 2 minutes.  Follow-up imaging demonstrated greater than 60% residual stenosis and a 6 mm x 60 mm life stent is deployed across this lesion it was postdilated with a 6 mm Lutonix drug-eluting balloon inflated to 8 atm for 1 minute.  Follow-up imaging now demonstrates wide patency with less than 5% residual stenosis.  The detectors repositioned in the trifurcation is reimaged.  A 4 mm x 150 mm Lutonix drug-eluting balloon was then advanced down into the peroneal and balloon angioplasty was performed to the peroneal tibioperoneal trunk.  Inflation is to 6 atm for 2 minutes.  Follow-up imaging demonstrates wide patency of the tibioperoneal trunk peroneal with less than 5% residual stenosis.  There is a lesion in the distal popliteal which is then treated with a 5 mm x 60 mm Lutonix drug-eluting balloon inflated to 8 atm for 1 minute.  Follow-up imaging demonstrates less than 10% residual stenosis at this location.  Distal runoff is then reassessed and noted to be patent and unchanged.  After review of these images the  sheath is pulled into the right external iliac oblique of the common femoral is obtained and a Star close device deployed. There no immediate complications.   Findings:  The abdominal aorta is opacified the post injection contrast.  Is widely patent.  No evidence of hemodynamically significant stenoses.  There single renal arteries noted no evidence of renal artery stenosis.  Normal nephrograms noted bilaterally.  The aortic bifurcation is widely patent as is the common internal and external iliac arteries bilaterally.  The left common femoral profunda femoris is widely patent.  The SFA is patent proximally although there is mild atherosclerotic changes.  At the level of Hunter's canal there is a focal greater than 70% stenosis.  The proximal popliteal is widely patent however in the midportion of the popliteal extending into the distal portion there is a greater than 80% stenosis.  The trifurcation is heavily diseased with occlusion of the anterior tibial shortly after its origin.  There is reconstitution of the anterior tibial which is patent down to the ankle.  The dorsalis pedis appears to occlude proximally and remains occluded through its proximal half.  It is reconstituted by collaterals distally.  The tibioperoneal trunk demonstrates diffuse greater than 70% stenosis.  The origin of the peroneal is included in the 70% stenosis beyond this the peroneal is widely patent and the dominant runoff to the foot.  Posterior tibial is nonvisualized from its origin all the way down to the ankle.  The peroneal  provides large collaterals refilling the plantar arteries.  The SFA is treated with balloon angioplasty and there is high-grade residual stenosis and therefore a life stent is deployed.  Following postdilatation there is less than 5% residual stenosis.  The tibioperoneal trunk and peroneal are treated with a 4 mm inflation for after which there is less than 5% residual stenosis.  Patient now has inline  flow via the peroneal.  I would move forward with debridement.  Should his wound demonstrate poor healing then the anterior tibial could be recruited but this would require pedal access in a retrograde fashion and then treatment of the occluded dorsalis pedis as well.   Summary: Successful recanalization left lower extremity for limb salvage    Disposition: Patient was taken to the recovery room in stable condition having tolerated the procedure well.  Timothy [name], Timothy [name] 03/18/2020,10:41 AM

## 2020-03-18 NOTE — Progress Notes (Signed)
PROGRESS NOTE    Timothy SillWarren B Ostrovsky Jr.  ZOX:[id number] DOB: August 21, 1960 DOA: 03/15/2020 PCP: Rolm GalaIloabachie, Chioma E, NP    Brief Narrative:  Timothy SillWarren B Tokarz Jr. is a 59 y.o. male with medical history significant of hypertension, type 2 diabetes mellitus, hypercholesterolemia, tobacco abuse, depression/anxiety who presents to the ED for evaluation of a infection and his left hallux. Podiatry was consulted. MRI completed.     Consultants:   Podiatry  Procedures:  MRI 1. Acute osteomyelitis of the distal tuft of the left great toe distal phalanx. 2. Fluid and scattered foci of susceptibility within the subungual region of the great toe. Additional fluid collection along the plantar surface of the great toe distal phalanx measuring 1.4 x 0.9 x 1.6 cm, which may represent a small abscesses or phlegmonous change. 3. Minimal subchondral bone marrow edema within the great toe proximal phalanx adjacent to the IP joint with preservation of the fatty T1 bone marrow signal. Findings may be reactive. Early osteomyelitis not excluded. 4. Edema-like signal throughout the intrinsic foot musculature suggesting a nonspecific myositis  Antimicrobials:    vancomycin   Subjective: Patient status post angiogram.  Complaining of some soreness otherwise no other complaints.  Objective: Vitals:   03/17/20 2003 03/17/20 2355 03/18/20 0322 03/18/20 0729  BP: 140/79 (!) 141/86 136/80 (!) 155/84  Pulse: 73 70 66 66  Resp: 16 16 17 16   Temp: 99.1 F (37.3 C) 98.9 F (37.2 C) 98.5 F (36.9 C) 97.9 F (36.6 C)  TempSrc:   Oral Oral  SpO2: 95% 94% 93% 95%  Weight:      Height:        Intake/Output Summary (Last 24 hours) at 03/18/2020 0807 Last data filed at 03/18/2020 0700 Gross per 24 hour  Intake 2861.3 ml  Output 3550 ml  Net -688.7 ml   Filed Weights   03/15/20 1519  Weight: 84.8 kg    Examination: Comfortable, laying in bed, NAD CTA, no W/R/R Regular, S1-S2 no murmurs rubs  gallops Abdomen soft benign positive bowel sounds Left lower extremity is wrapped in dressing, no edema the right Alert oriented x3, grossly intact Mood and affect appropriate in current setting    Data Reviewed: I have personally reviewed following labs and imaging studies  CBC: Recent Labs  Lab 03/15/20 1531 03/16/20 0555  WBC 9.9 8.0  NEUTROABS 7.0  --   HGB 13.3 12.8*  HCT 37.5* 36.2*  MCV 97.2 96.5  PLT 308 273   Basic Metabolic Panel: Recent Labs  Lab 03/15/20 1531 03/16/20 0555 03/17/20 0449 03/18/20 0536  NA 132* 133*  --  134*  K 3.7 3.5 3.7 3.7  CL 97* 95*  --  100  CO2 24 26  --  25  GLUCOSE 236* 225*  --  176*  BUN 17 13  --  9  CREATININE 0.77 0.79 0.75 0.61  CALCIUM 9.1 8.9  --  8.5*   GFR: Estimated Creatinine Clearance: 103.9 mL/min (by C-G formula based on SCr of 0.61 mg/dL). Liver Function Tests: Recent Labs  Lab 03/15/20 1531 03/16/20 0555  AST 21 17  ALT 21 18  ALKPHOS 99 92  BILITOT 0.7 0.7  PROT 7.9 7.4  ALBUMIN 3.8 3.4*   No results for input(s): LIPASE, AMYLASE in the last 168 hours. No results for input(s): AMMONIA in the last 168 hours. Coagulation Profile: Recent Labs  Lab 03/16/20 0555  INR 0.9   Cardiac Enzymes: No results for input(s): CKTOTAL, CKMB, CKMBINDEX, TROPONINI in  the last 168 hours. BNP (last 3 results) No results for input(s): PROBNP in the last 8760 hours. HbA1C: Recent Labs    03/15/20 1531  HGBA1C 8.5*   CBG: Recent Labs  Lab 03/17/20 0738 03/17/20 1135 03/17/20 1628 03/17/20 2023 03/18/20 0730  GLUCAP 184* 159* 133* 174* 180*   Lipid Profile: No results for input(s): CHOL, HDL, LDLCALC, TRIG, CHOLHDL, LDLDIRECT in the last 72 hours. Thyroid Function Tests: No results for input(s): TSH, T4TOTAL, FREET4, T3FREE, THYROIDAB in the last 72 hours. Anemia Panel: No results for input(s): VITAMINB12, FOLATE, FERRITIN, TIBC, IRON, RETICCTPCT in the last 72 hours. Sepsis Labs: Recent Labs  Lab  03/15/20 1531  LATICACIDVEN 1.3    Recent Results (from the past 240 hour(s))  Respiratory Panel by RT PCR (Flu A&B, Covid) - Nasopharyngeal Swab     Status: None   Collection Time: 03/15/20  4:55 PM   Specimen: Nasopharyngeal Swab  Result Value Ref Range Status   SARS Coronavirus 2 by RT PCR NEGATIVE NEGATIVE Final    Comment: (NOTE) SARS-CoV-2 target nucleic acids are NOT DETECTED.  The SARS-CoV-2 RNA is generally detectable in upper respiratoy specimens during the acute phase of infection. The lowest concentration of SARS-CoV-2 viral copies this assay can detect is 131 copies/mL. A negative result does not preclude SARS-Cov-2 infection and should not be used as the sole basis for treatment or other patient management decisions. A negative result may occur with  improper specimen collection/handling, submission of specimen other than nasopharyngeal swab, presence of viral mutation(s) within the areas targeted by this assay, and inadequate number of viral copies (<131 copies/mL). A negative result must be combined with clinical observations, patient history, and epidemiological information. The expected result is Negative.  Fact Sheet for Patients:  https://www.moore.com/  Fact Sheet for Healthcare Providers:  https://www.young.biz/  This test is no t yet approved or cleared by the [place] FDA and  has been authorized for detection and/or diagnosis of SARS-CoV-2 by FDA under an Emergency Use Authorization (EUA). This EUA will remain  in effect (meaning this test can be used) for the duration of the COVID-19 declaration under Section 564(b)(1) of the Act, 21 U.S.C. section 360bbb-3(b)(1), unless the authorization is terminated or revoked sooner.     Influenza A by PCR NEGATIVE NEGATIVE Final   Influenza B by PCR NEGATIVE NEGATIVE Final    Comment: (NOTE) The Xpert Xpress SARS-CoV-2/FLU/RSV assay is intended as an aid in  the  diagnosis of influenza from Nasopharyngeal swab specimens and  should not be used as a sole basis for treatment. Nasal washings and  aspirates are unacceptable for Xpert Xpress SARS-CoV-2/FLU/RSV  testing.  Fact Sheet for Patients: https://www.moore.com/  Fact Sheet for Healthcare Providers: https://www.young.biz/  This test is not yet approved or cleared by the [place] FDA and  has been authorized for detection and/or diagnosis of SARS-CoV-2 by  FDA under an Emergency Use Authorization (EUA). This EUA will remain  in effect (meaning this test can be used) for the duration of the  Covid-19 declaration under Section 564(b)(1) of the Act, 21  U.S.C. section 360bbb-3(b)(1), unless the authorization is  terminated or revoked. Performed at [facility], [address] [place], [place] [address]   Aerobic/Anaerobic Culture (surgical/deep wound)     Status: None (Preliminary result)   Collection Time: 03/16/20  5:43 PM   Specimen: Bone; Tissue  Result Value Ref Range Status   Specimen Description   Final    BONE Performed  at [facility], [address] [place], [place] [address]    Special Requests   Final    NONE Performed at [facility], [address] [place], [place] [address]    Gram Stain   Final    MODERATE WBC PRESENT,BOTH PMN AND MONONUCLEAR FEW GRAM POSITIVE COCCI RARE GRAM NEGATIVE RODS FEW GRAM VARIABLE ROD    Culture   Final    FEW GROUP B STREP(S.AGALACTIAE)ISOLATED TESTING AGAINST S. AGALACTIAE NOT ROUTINELY PERFORMED DUE TO PREDICTABILITY OF AMP/PEN/VAN SUSCEPTIBILITY. Performed at [facility], [address] [place], [place] [address]    Report Status PENDING  Incomplete         Radiology Studies: No results found.      Scheduled Meds:  amLODipine  5 mg Oral Daily   citalopram  20 mg Oral Daily   cloNIDine  0.1 mg Oral QHS   enoxaparin (LOVENOX) injection   40 mg Subcutaneous Q24H   gabapentin  300 mg Oral BID   insulin aspart  0-15 Units Subcutaneous TID WC   insulin aspart  0-5 Units Subcutaneous QHS   living well with diabetes book   Does not apply Once   mometasone-formoterol  2 puff Inhalation BID   mupirocin ointment  1 application Nasal BID   nicotine  21 mg Transdermal Daily   pantoprazole  40 mg Oral Daily   rosuvastatin  5 mg Oral Daily   Continuous Infusions:  sodium chloride 100 mL/hr at 03/17/20 1617   sodium chloride 75 mL/hr at 03/18/20 0034   cefTRIAXone (ROCEPHIN)  IV Stopped (03/17/20 1326)    Assessment & Plan:   Active Problems:   Essential hypertension   Smoking   Type 2 diabetes mellitus with diabetic neuropathy, unspecified (HCC)   Osteomyelitis (HCC)   Infection of left hallux Left foot cellulitis MRI completed found with acute osteomyelitis of the distal tuft of the left great toe distal phalanx.Possible small abscesses or phlegmon exchange Podiatry following -status post amputation left great toe and I&D abscess left dorsal forefoot on 9/29  10/1-status post angiogram but, status post angioplasty left peroneal and tibioperoneal trunk and stent placement of left superficial femoral artery 10/1 done by Dr. Gilda Crease Culture group B strep-IDs input was appreciated, vancomycin and ceftriaxone was changed to ampicillin plus sulbactam. Per Dr. Graciela Husbands once ischemia demarcates he will need further surgical intervention  Hypertension Control, continue amlodipine and clonidine home doses      Tobacco abuse On nicotine patch Did counsel patient again tonight about smoking cessation    Emphysema-stable without exacerbation  Continue inhalers    Type 2 diabetes mellitus, poor control Patient on Metformin and glipizide Last hemoglobin A1c 9.3 on 01/13/2020 Oral agents on hold  10/1-BG's are stable  Continue, R-ISS, Hypoglycemic protocal protocol      Hyperlipidemia Continue  crestor  Anxiety/depression Continue on Celexa  Continue nightly Catapres    GERD PPI    DVT prophylaxis: lovenox Code Status: Full Family Communication: None at bedside  Status is: Inpatient  Remains inpatient appropriate because:IV treatments appropriate due to intensity of illness or inability to take PO   Dispo: The patient is from: Home              Anticipated d/c is to: Home              Anticipated d/c date is: 3 days              Patient currently is not  medically stable to d/c.s/p angiogram today, plan for more intervention            LOS: 3 days   Time spent: 35 min with more than 50% on COC    Lynn Ito, MD [facility] Pager [phone]  If 7PM-7AM, please contact night-coverage [email or web address] Password [email or web address] 03/18/2020, 8:07 AM

## 2020-03-18 NOTE — Progress Notes (Addendum)
Day of Surgery   Subjective/Chief Complaint: Patient seen.  States he is a little grumpy today.   Objective: Vital signs in last 24 hours: Temp:  [97.5 F (36.4 C)-99.1 F (37.3 C)] 97.5 F (36.4 C) (10/01 1222) Pulse Rate:  [63-76] 66 (10/01 1222) Resp:  [10-21] 16 (10/01 1222) BP: (131-156)/(76-94) 152/94 (10/01 1222) SpO2:  [93 %-98 %] 95 % (10/01 1222) Weight:  [84.8 kg] 84.8 kg (10/01 0850) Last BM Date:  (PTA, pt doesn't recall)  Intake/Output from previous day: 09/30 0701 - 10/01 0700 In: 2861.3 [P.O.:240; I.V.:2271.3; IV Piggyback:350] Out: 3550 [Urine:3550] Intake/Output this shift: Total I/O In: -  Out: 700 [Urine:700]  Mild bleeding noted on the bandaging with minimal drainage.  Incisions fairly well coapted.  Continued cyanotic changes at the amputation site and some dorsally but erythema appears to be somewhat improved.    Lab Results:  Recent Labs    03/15/20 1531 03/16/20 0555  WBC 9.9 8.0  HGB 13.3 12.8*  HCT 37.5* 36.2*  PLT 308 273   BMET Recent Labs    03/16/20 0555 03/16/20 0555 03/17/20 0449 03/18/20 0536  NA 133*  --   --  134*  K 3.5   < > 3.7 3.7  CL 95*  --   --  100  CO2 26  --   --  25  GLUCOSE 225*  --   --  176*  BUN 13  --   --  9  CREATININE 0.79   < > 0.75 0.61  CALCIUM 8.9  --   --  8.5*   < > = values in this interval not displayed.   PT/INR Recent Labs    03/16/20 0555  LABPROT 12.2  INR 0.9   ABG No results for input(s): PHART, HCO3 in the last 72 hours.  Invalid input(s): PCO2, PO2  Studies/Results: PERIPHERAL VASCULAR CATHETERIZATION  Result Date: 03/18/2020 See Op note   Anti-infectives: Anti-infectives (From admission, onward)   Start     Dose/Rate Route Frequency Ordered Stop   03/18/20 1230  Ampicillin-Sulbactam (UNASYN) 3 g in sodium chloride 0.9 % 100 mL IVPB        3 g 200 mL/hr over 30 Minutes Intravenous Every 6 hours 03/18/20 1140     03/18/20 1100  clindamycin (CLEOCIN) IVPB 300 mg   Status:  Discontinued       Note to Pharmacy: To be given in specials   300 mg 100 mL/hr over 30 Minutes Intravenous  Once 03/18/20 1056 03/18/20 1138   03/18/20 0821  clindamycin (CLEOCIN) 300 MG/50ML IVPB       Note to Pharmacy: Maynor, Erin   : cabinet override      03/18/20 0821 03/18/20 0947   03/17/20 1200  cefTRIAXone (ROCEPHIN) 2 g in sodium chloride 0.9 % 100 mL IVPB  Status:  Discontinued        2 g 200 mL/hr over 30 Minutes Intravenous Every 24 hours 03/17/20 1101 03/18/20 1140   03/16/20 2000  vancomycin (VANCOREADY) IVPB 1250 mg/250 mL  Status:  Discontinued        1,250 mg 166.7 mL/hr over 90 Minutes Intravenous Every 12 hours 03/16/20 1311 03/17/20 1810   03/16/20 0600  vancomycin (VANCOCIN) IVPB 1000 mg/200 mL premix  Status:  Discontinued        1,000 mg 200 mL/hr over 60 Minutes Intravenous Every 8 hours 03/15/20 1726 03/16/20 1311   03/15/20 1800  vancomycin (VANCOCIN) IVPB 1000 mg/200 mL premix  1,000 mg 200 mL/hr over 60 Minutes Intravenous  Once 03/15/20 1725 03/15/20 2103   03/15/20 1600  vancomycin (VANCOCIN) IVPB 1000 mg/200 mL premix        1,000 mg 200 mL/hr over 60 Minutes Intravenous  Once 03/15/20 1554 03/15/20 1823   03/15/20 1600  meropenem (MERREM) 1 g in sodium chloride 0.9 % 100 mL IVPB  Status:  Discontinued        1 g 200 mL/hr over 30 Minutes Intravenous Every 8 hours 03/15/20 1554 03/16/20 1301      Assessment/Plan: s/p Procedure(s): Lower Extremity Angiography (Left) Assessment: Stable status post amputation and I&D left foot   Plan: Patient had his vascular intervention today.  Discussed with the patient that we will give this several days to demarcate to hopefully revealed a level of viable tissue.  Most likely will still need some type of revision.  Saline wet-to-dry gauze packed within the dorsal wound with Betadine to the incision area followed by a bulky bandage.  LOS: 3 days    Ricci Barker 03/18/2020

## 2020-03-18 NOTE — Progress Notes (Signed)
ID I could not see patient today as he was not in his room when I went to see him He was getting angio  He had PTA and stent placement of left superficial femoral artery and PTA of the left peroneal and tibioperoneal trunk 03/18/20   03/17/20   Saw pictures taken by Dr.cline- today the edema  is better ischemia persist  CBC Latest Ref Rng & Units 03/16/2020 03/15/2020 10/28/2019  WBC 4.0 - 10.5 K/uL 8.0 9.9 8.7  Hemoglobin 13.0 - 17.0 g/dL 12.8(L) 13.3 16.3  Hematocrit 39 - 52 % 36.2(L) 37.5(L) 46.8  Platelets 150 - 400 K/uL 273 308 293   CMP Latest Ref Rng & Units 03/18/2020 03/17/2020 03/16/2020  Glucose 70 - 99 mg/dL 284(X) - 324(M)  BUN 6 - 20 mg/dL 9 - 13  Creatinine 0.10 - 1.24 mg/dL 2.72 5.36 6.44  Sodium 135 - 145 mmol/L 134(L) - 133(L)  Potassium 3.5 - 5.1 mmol/L 3.7 3.7 3.5  Chloride 98 - 111 mmol/L 100 - 95(L)  CO2 22 - 32 mmol/L 25 - 26  Calcium 8.9 - 10.3 mg/dL 0.3(K) - 8.9  Total Protein 6.5 - 8.1 g/dL - - 7.4  Total Bilirubin 0.3 - 1.2 mg/dL - - 0.7  Alkaline Phos 38 - 126 U/L - - 92  AST 15 - 41 U/L - - 17  ALT 0 - 44 U/L - - 18     Impression/recommendation Left foot infection with DM, PAD  S/p partial amputation of the great toe ischemia present  Underwent angioplasty and stenting  Culture Group B strep so far  change ceftriaxone and vanco to Ampicillin+ sulbactam As Per Dr.Clinie once the ischemia demarcates he will need further surgical intervention ID will follow him remotely this weekend- call if needed

## 2020-03-19 LAB — BASIC METABOLIC PANEL
Anion gap: 11 (ref 5–15)
BUN: 9 mg/dL (ref 6–20)
CO2: 26 mmol/L (ref 22–32)
Calcium: 8.8 mg/dL — ABNORMAL LOW (ref 8.9–10.3)
Chloride: 97 mmol/L — ABNORMAL LOW (ref 98–111)
Creatinine, Ser: 0.74 mg/dL (ref 0.61–1.24)
GFR calc Af Amer: >60 mL/min (ref >60)
GFR calc non Af Amer: >60 mL/min (ref >60)
Glucose, Bld: 149 mg/dL — ABNORMAL HIGH (ref 70–99)
Potassium: 3.5 mmol/L (ref 3.5–5.1)
Sodium: 134 mmol/L — ABNORMAL LOW (ref 135–145)

## 2020-03-19 LAB — GLUCOSE, CAPILLARY
Glucose-Capillary: 161 mg/dL — ABNORMAL HIGH (ref 70–99)
Glucose-Capillary: 129 mg/dL — ABNORMAL HIGH (ref 70–99)
Glucose-Capillary: 228 mg/dL — ABNORMAL HIGH (ref 70–99)
Glucose-Capillary: 184 mg/dL — ABNORMAL HIGH (ref 70–99)

## 2020-03-19 LAB — CBC
HCT: 33 % — ABNORMAL LOW (ref 39.0–52.0)
Hemoglobin: 11.8 g/dL — ABNORMAL LOW (ref 13.0–17.0)
MCH: 34 pg (ref 26.0–34.0)
MCHC: 35.8 g/dL (ref 30.0–36.0)
MCV: 95.1 fL (ref 80.0–100.0)
Platelets: 335 10*3/uL (ref 150–400)
RBC: 3.47 MIL/uL — ABNORMAL LOW (ref 4.22–5.81)
RDW: 12.2 % (ref 11.5–15.5)
WBC: 8.1 10*3/uL (ref 4.0–10.5)
nRBC: 0 % (ref 0.0–0.2)

## 2020-03-19 MED ORDER — AMLODIPINE BESYLATE 5 MG PO TABS
5.0000 mg | ORAL_TABLET | Freq: Once | ORAL | Status: AC
  Administered 2020-03-19: 5 mg via ORAL
  Filled 2020-03-19: qty 1

## 2020-03-19 MED ORDER — AMLODIPINE BESYLATE 10 MG PO TABS
10.0000 mg | ORAL_TABLET | Freq: Every day | ORAL | Status: DC
  Administered 2020-03-20 – 2020-03-23 (×4): 10 mg via ORAL
  Filled 2020-03-19 (×4): qty 1

## 2020-03-19 NOTE — Plan of Care (Signed)
  Problem: Health Behavior/Discharge Planning: Goal: Ability to manage health-related needs will improve 03/19/2020 1749 by Webb Laws, RN Outcome: Progressing 03/19/2020 1741 by Webb Laws, RN Outcome: Adequate for Discharge   Problem: Clinical Measurements: Goal: Ability to maintain clinical measurements within normal limits will improve 03/19/2020 1749 by Webb Laws, RN Outcome: Progressing 03/19/2020 1741 by Webb Laws, RN Outcome: Progressing Goal: Will remain free from infection 03/19/2020 1749 by Webb Laws, RN Outcome: Progressing 03/19/2020 1741 by Webb Laws, RN Outcome: Progressing   Problem: Activity: Goal: Risk for activity intolerance will decrease 03/19/2020 1749 by Webb Laws, RN Outcome: Progressing 03/19/2020 1741 by Webb Laws, RN Outcome: Progressing   Problem: Pain Managment: Goal: General experience of comfort will improve 03/19/2020 1749 by Webb Laws, RN Outcome: Progressing 03/19/2020 1741 by Webb Laws, RN Outcome: Adequate for Discharge

## 2020-03-19 NOTE — Plan of Care (Deleted)
°  Problem: Clinical Measurements: °Goal: Ability to maintain clinical measurements within normal limits will improve °Outcome: Progressing °Goal: Will remain free from infection °Outcome: Progressing °Goal: Respiratory complications will improve °Outcome: Progressing °  °Problem: Activity: °Goal: Risk for activity intolerance will decrease °Outcome: Progressing °  °Problem: Coping: °Goal: Level of anxiety will decrease °Outcome: Progressing °  °

## 2020-03-19 NOTE — Progress Notes (Signed)
PROGRESS NOTE    Timothy [name].  ZMO:[id number] DOB: Mar 20, 1961 DOA: 03/15/2020 PCP: Rolm Gala, NP    Brief Narrative:  Timothy States. is a 59 y.o. male with medical history significant of hypertension, type 2 diabetes mellitus, hypercholesterolemia, tobacco abuse, depression/anxiety who presents to the ED for evaluation of a infection and his left hallux. Podiatry was consulted. MRI completed.   10/2- pt had angiogram with intervention yesterday by vascular  Consultants:   Podiatry  Procedures:  MRI 1. Acute osteomyelitis of the distal tuft of the left great toe distal phalanx. 2. Fluid and scattered foci of susceptibility within the subungual region of the great toe. Additional fluid collection along the plantar surface of the great toe distal phalanx measuring 1.4 x 0.9 x 1.6 cm, which may represent a small abscesses or phlegmonous change. 3. Minimal subchondral bone marrow edema within the great toe proximal phalanx adjacent to the IP joint with preservation of the fatty T1 bone marrow signal. Findings may be reactive. Early osteomyelitis not excluded. 4. Edema-like signal throughout the intrinsic foot musculature suggesting a nonspecific myositis  Antimicrobials:    vancomycin   Subjective: Patient is sleeping and asked me when he is going home.  Denies any pain or discomfort.  Denies chills or fever.  Objective: Vitals:   03/18/20 1947 03/18/20 2347 03/19/20 0413 03/19/20 0747  BP: (!) 152/88 (!) 147/87 (!) 140/94 (!) 159/83  Pulse: 70 74 70 67  Resp: 16 18 17 18   Temp: 98.7 F (37.1 C) 99.1 F (37.3 C) 98.4 F (36.9 C) 99.1 F (37.3 C)  TempSrc: Oral Oral Oral Axillary  SpO2: 94% 94% 95% 99%  Weight:      Height:        Intake/Output Summary (Last 24 hours) at 03/19/2020 0821 Last data filed at 03/19/2020 0400 Gross per 24 hour  Intake 586.89 ml  Output 2100 ml  Net -1513.11 ml   Filed Weights   03/15/20 1519 03/18/20 0850    Weight: 84.8 kg 84.8 kg    Examination: Laying in bed, comfortable, NAD CTA, no wheeze rales rhonchi's RRR S1-S2 no murmurs rubs gallops Soft benign abdomen, nontender positive bowel sounds Left lower extremity is wrapped in dressing and Ace bandage, no edema of the right Alert oriented x3, grossly intact Current mood and affect appropriate in setting    Data Reviewed: I have personally reviewed following labs and imaging studies  CBC: Recent Labs  Lab 03/15/20 1531 03/16/20 0555 03/19/20 0430  WBC 9.9 8.0 8.1  NEUTROABS 7.0  --   --   HGB 13.3 12.8* 11.8*  HCT 37.5* 36.2* 33.0*  MCV 97.2 96.5 95.1  PLT 308 273 335   Basic Metabolic Panel: Recent Labs  Lab 03/15/20 1531 03/16/20 0555 03/17/20 0449 03/18/20 0536 03/19/20 0430  NA 132* 133*  --  134* 134*  K 3.7 3.5 3.7 3.7 3.5  CL 97* 95*  --  100 97*  CO2 24 26  --  25 26  GLUCOSE 236* 225*  --  176* 149*  BUN 17 13  --  9 9  CREATININE 0.77 0.79 0.75 0.61 0.74  CALCIUM 9.1 8.9  --  8.5* 8.8*   GFR: Estimated Creatinine Clearance: 103.9 mL/min (by C-G formula based on SCr of 0.74 mg/dL). Liver Function Tests: Recent Labs  Lab 03/15/20 1531 03/16/20 0555  AST 21 17  ALT 21 18  ALKPHOS 99 92  BILITOT 0.7 0.7  PROT 7.9 7.4  ALBUMIN 3.8 3.4*   No results for input(s): LIPASE, AMYLASE in the last 168 hours. No results for input(s): AMMONIA in the last 168 hours. Coagulation Profile: Recent Labs  Lab 03/16/20 0555  INR 0.9   Cardiac Enzymes: No results for input(s): CKTOTAL, CKMB, CKMBINDEX, TROPONINI in the last 168 hours. BNP (last 3 results) No results for input(s): PROBNP in the last 8760 hours. HbA1C: No results for input(s): HGBA1C in the last 72 hours. CBG: Recent Labs  Lab 03/18/20 0730 03/18/20 1046 03/18/20 1150 03/18/20 1646 03/18/20 2049  GLUCAP 180* 173* 164* 102* 107*   Lipid Profile: No results for input(s): CHOL, HDL, LDLCALC, TRIG, CHOLHDL, LDLDIRECT in the last 72  hours. Thyroid Function Tests: No results for input(s): TSH, T4TOTAL, FREET4, T3FREE, THYROIDAB in the last 72 hours. Anemia Panel: No results for input(s): VITAMINB12, FOLATE, FERRITIN, TIBC, IRON, RETICCTPCT in the last 72 hours. Sepsis Labs: Recent Labs  Lab 03/15/20 1531  LATICACIDVEN 1.3    Recent Results (from the past 240 hour(s))  Respiratory Panel by RT PCR (Flu A&B, Covid) - Nasopharyngeal Swab     Status: None   Collection Time: 03/15/20  4:55 PM   Specimen: Nasopharyngeal Swab  Result Value Ref Range Status   SARS Coronavirus 2 by RT PCR NEGATIVE NEGATIVE Final    Comment: (NOTE) SARS-CoV-2 target nucleic acids are NOT DETECTED.  The SARS-CoV-2 RNA is generally detectable in upper respiratoy specimens during the acute phase of infection. The lowest concentration of SARS-CoV-2 viral copies this assay can detect is 131 copies/mL. A negative result does not preclude SARS-Cov-2 infection and should not be used as the sole basis for treatment or other patient management decisions. A negative result may occur with  improper specimen collection/handling, submission of specimen other than nasopharyngeal swab, presence of viral mutation(s) within the areas targeted by this assay, and inadequate number of viral copies (<131 copies/mL). A negative result must be combined with clinical observations, patient history, and epidemiological information. The expected result is Negative.  Fact Sheet for Patients:  https://www.moore.com/  Fact Sheet for Healthcare Providers:  https://www.young.biz/  This test is no t yet approved or cleared by the [place] FDA and  has been authorized for detection and/or diagnosis of SARS-CoV-2 by FDA under an Emergency Use Authorization (EUA). This EUA will remain  in effect (meaning this test can be used) for the duration of the COVID-19 declaration under Section 564(b)(1) of the Act, 21  U.S.C. section 360bbb-3(b)(1), unless the authorization is terminated or revoked sooner.     Influenza A by PCR NEGATIVE NEGATIVE Final   Influenza B by PCR NEGATIVE NEGATIVE Final    Comment: (NOTE) The Xpert Xpress SARS-CoV-2/FLU/RSV assay is intended as an aid in  the diagnosis of influenza from Nasopharyngeal swab specimens and  should not be used as a sole basis for treatment. Nasal washings and  aspirates are unacceptable for Xpert Xpress SARS-CoV-2/FLU/RSV  testing.  Fact Sheet for Patients: https://www.moore.com/  Fact Sheet for Healthcare Providers: https://www.young.biz/  This test is not yet approved or cleared by the [place] FDA and  has been authorized for detection and/or diagnosis of SARS-CoV-2 by  FDA under an Emergency Use Authorization (EUA). This EUA will remain  in effect (meaning this test can be used) for the duration of the  Covid-19 declaration under Section 564(b)(1) of the Act, 21  U.S.C. section 360bbb-3(b)(1), unless the authorization is  terminated or revoked. Performed at [facility], [address]  [place], [place] [address]   Aerobic/Anaerobic Culture (surgical/deep wound)     Status: None (Preliminary result)   Collection Time: 03/16/20  5:43 PM   Specimen: Bone; Tissue  Result Value Ref Range Status   Specimen Description   Final    BONE Performed at [facility], [address] [place], [place] [address]    Special Requests   Final    NONE Performed at [facility], [address] [place], [place] [address]    Gram Stain   Final    MODERATE WBC PRESENT,BOTH PMN AND MONONUCLEAR FEW GRAM POSITIVE COCCI RARE GRAM NEGATIVE RODS FEW GRAM VARIABLE ROD    Culture   Final    FEW GROUP B STREP(S.AGALACTIAE)ISOLATED TESTING AGAINST S. AGALACTIAE NOT ROUTINELY PERFORMED DUE TO PREDICTABILITY OF AMP/PEN/VAN SUSCEPTIBILITY. Performed at [facility], [address] [place], [place] [address]    Report Status PENDING  Incomplete         Radiology Studies: PERIPHERAL VASCULAR CATHETERIZATION  Result Date: 03/18/2020 See Op note       Scheduled Meds: . amLODipine  5 mg Oral Daily  . citalopram  20 mg Oral Daily  . cloNIDine  0.1 mg Oral QHS  . clopidogrel  75 mg Oral Q breakfast  . enoxaparin (LOVENOX) injection  40 mg Subcutaneous Q24H  . gabapentin  300 mg Oral BID  . insulin aspart  0-15 Units Subcutaneous TID WC  . insulin aspart  0-5 Units Subcutaneous QHS  . living well with diabetes book   Does not apply Once  . mometasone-formoterol  2 puff Inhalation BID  . mupirocin ointment  1 application Nasal BID  . nicotine  21 mg Transdermal Daily  . pantoprazole  40 mg Oral Daily  . rosuvastatin  5 mg Oral Daily  . sodium chloride flush  3 mL Intravenous Q12H   Continuous Infusions: . sodium chloride 75 mL/hr at 03/19/20 0724  . sodium chloride    . ampicillin-sulbactam (UNASYN) IV Stopped (03/19/20 0233)    Assessment & Plan:   Active Problems:   Essential hypertension   Smoking   Type 2 diabetes mellitus with diabetic neuropathy, unspecified (HCC)   Osteomyelitis (HCC)   Infection of left hallux Left foot cellulitis MRI completed found with acute osteomyelitis of the distal tuft of the left great toe distal phalanx.Possible small abscesses or phlegmon exchange Podiatry following -status post amputation left great toe and I&D abscess left dorsal forefoot on 9/29  10/1-status post angiogram but, status post angioplasty left peroneal and tibioperoneal trunk and stent placement of left superficial femoral artery 10/1 done by Dr. Gilda CreaseSchnier 10/2- wound cx with Strep Gp B .  ID recommended changing vancomycin and ceftriaxone to ampicillin plus sulbactam, will continue Per Dr. Graciela HusbandsKlein once ischemia demarcates he will need further surgical intervention  Hypertension Mildly elevated, will increase amlodipine from 5 to 10  mg Continue clonidine   Tobacco abuse Continue nicotine patch Was counseled about smoking cessation     Emphysema-stable without exacerbation  Continue inhalers     Type 2 diabetes mellitus, poor control Patient on Metformin and glipizide Last hemoglobin A1c 9.3 on 01/13/2020 Oral agents on hold  Blood glucose levels remain stable  Continue R ISS, hypoglycemic protocol     Hyperlipidemia Continue crestor  Anxiety/depression Continue on Celexa  Continue nightly Catapres    GERD PPI    DVT prophylaxis: lovenox Code Status: Full Family Communication: None at bedside  Status is: Inpatient  Remains inpatient appropriate because:IV treatments appropriate due to intensity of illness or inability to take PO   Dispo: The patient is from: Home              Anticipated d/c is to: Home              Anticipated d/c date is: 3 days              Patient currently is not medically stable to d/c.Plan for more surgical intervention           LOS: 4 days   Time spent: 35 min with more than 50% on COC    Timothy Ito, MD [facility] Pager [phone]  If 7PM-7AM, please contact night-coverage [email or web address] Password [email or web address] 03/19/2020, 8:21 AM

## 2020-03-20 LAB — GLUCOSE, CAPILLARY
Glucose-Capillary: 196 mg/dL — ABNORMAL HIGH (ref 70–99)
Glucose-Capillary: 189 mg/dL — ABNORMAL HIGH (ref 70–99)
Glucose-Capillary: 171 mg/dL — ABNORMAL HIGH (ref 70–99)
Glucose-Capillary: 245 mg/dL — ABNORMAL HIGH (ref 70–99)

## 2020-03-20 NOTE — Plan of Care (Signed)
  Problem: Activity: Goal: Risk for activity intolerance will decrease Outcome: Progressing   Problem: Coping: Goal: Level of anxiety will decrease Outcome: Progressing   

## 2020-03-20 NOTE — Progress Notes (Signed)
   03/20/20 0055  Unsuccessful Nursing Procedure/Treatment  Type of Nursing Procedure/Treatment Peripheral IV insertion  Number of attempts 2  Location of attempt Right hand, RFA

## 2020-03-20 NOTE — Progress Notes (Signed)
PROGRESS NOTE    Timothy [name].  DJM:[id number] DOB: 1960-12-21 DOA: 03/15/2020 PCP: Rolm Gala, NP    Brief Narrative:  Timothy [name]. is a 59 y.o. male with medical history significant of hypertension, type 2 diabetes mellitus, hypercholesterolemia, tobacco abuse, depression/anxiety who presents to the ED for evaluation of a infection and his left hallux. Podiatry was consulted. MRI completed.   10/2- pt had angiogram with intervention yesterday by vascular 10/3-redressed with Betadine wet-to-dry dressing by podiatry.  Podiatry recommends daily dressing changes.   Consultants:   Podiatry  Procedures:  MRI 1. Acute osteomyelitis of the distal tuft of the left great toe distal phalanx. 2. Fluid and scattered foci of susceptibility within the subungual region of the great toe. Additional fluid collection along the plantar surface of the great toe distal phalanx measuring 1.4 x 0.9 x 1.6 cm, which may represent a small abscesses or phlegmonous change. 3. Minimal subchondral bone marrow edema within the great toe proximal phalanx adjacent to the IP joint with preservation of the fatty T1 bone marrow signal. Findings may be reactive. Early osteomyelitis not excluded. 4. Edema-like signal throughout the intrinsic foot musculature suggesting a nonspecific myositis  Antimicrobials:    vancomycin   Subjective: Has no pain complaints.  Denies any chills or fever has no complaints overall.  Objective: Vitals:   03/19/20 1557 03/19/20 2002 03/19/20 2335 03/20/20 0429  BP: 128/72 (!) 150/84 (!) 158/85 (!) 152/90  Pulse: 78 72 66 63  Resp: 18 18 18 16   Temp: 99.3 F (37.4 C) 98.7 F (37.1 C) 98.2 F (36.8 C) 98.3 F (36.8 C)  TempSrc: Oral Oral Oral Oral  SpO2: 94% 100% 94% 96%  Weight:      Height:        Intake/Output Summary (Last 24 hours) at 03/20/2020 0802 Last data filed at 03/20/2020 0500 Gross per 24 hour  Intake 320 ml  Output 2625 ml    Net -2305 ml   Filed Weights   03/15/20 1519 03/18/20 0850  Weight: 84.8 kg 84.8 kg    Examination: Comfortable, calm, NAD  Clear to auscultation, no wheeze rales rhonchi's  Regular S1-S2 no murmurs rubs gallops  Soft nontender nondistended positive bowel sounds  Left lower extremity in dressing and Ace bandage, I did not open.  No right lower extremity pain or edema.   Alert oriented x3 grossly intact Mood and affect appropriate in setting      Data Reviewed: I have personally reviewed following labs and imaging studies  CBC: Recent Labs  Lab 03/15/20 1531 03/16/20 0555 03/19/20 0430  WBC 9.9 8.0 8.1  NEUTROABS 7.0  --   --   HGB 13.3 12.8* 11.8*  HCT 37.5* 36.2* 33.0*  MCV 97.2 96.5 95.1  PLT 308 273 335   Basic Metabolic Panel: Recent Labs  Lab 03/15/20 1531 03/16/20 0555 03/17/20 0449 03/18/20 0536 03/19/20 0430  NA 132* 133*  --  134* 134*  K 3.7 3.5 3.7 3.7 3.5  CL 97* 95*  --  100 97*  CO2 24 26  --  25 26  GLUCOSE 236* 225*  --  176* 149*  BUN 17 13  --  9 9  CREATININE 0.77 0.79 0.75 0.61 0.74  CALCIUM 9.1 8.9  --  8.5* 8.8*   GFR: Estimated Creatinine Clearance: 103.9 mL/min (by C-G formula based on SCr of 0.74 mg/dL). Liver Function Tests: Recent Labs  Lab 03/15/20 1531 03/16/20 0555  AST 21 17  ALT 21 18  ALKPHOS 99 92  BILITOT 0.7 0.7  PROT 7.9 7.4  ALBUMIN 3.8 3.4*   No results for input(s): LIPASE, AMYLASE in the last 168 hours. No results for input(s): AMMONIA in the last 168 hours. Coagulation Profile: Recent Labs  Lab 03/16/20 0555  INR 0.9   Cardiac Enzymes: No results for input(s): CKTOTAL, CKMB, CKMBINDEX, TROPONINI in the last 168 hours. BNP (last 3 results) No results for input(s): PROBNP in the last 8760 hours. HbA1C: No results for input(s): HGBA1C in the last 72 hours. CBG: Recent Labs  Lab 03/18/20 2049 03/19/20 0752 03/19/20 1159 03/19/20 1649 03/19/20 2009  GLUCAP 107* 161* 129* 228* 184*   Lipid  Profile: No results for input(s): CHOL, HDL, LDLCALC, TRIG, CHOLHDL, LDLDIRECT in the last 72 hours. Thyroid Function Tests: No results for input(s): TSH, T4TOTAL, FREET4, T3FREE, THYROIDAB in the last 72 hours. Anemia Panel: No results for input(s): VITAMINB12, FOLATE, FERRITIN, TIBC, IRON, RETICCTPCT in the last 72 hours. Sepsis Labs: Recent Labs  Lab 03/15/20 1531  LATICACIDVEN 1.3    Recent Results (from the past 240 hour(s))  Respiratory Panel by RT PCR (Flu A&B, Covid) - Nasopharyngeal Swab     Status: None   Collection Time: 03/15/20  4:55 PM   Specimen: Nasopharyngeal Swab  Result Value Ref Range Status   SARS Coronavirus 2 by RT PCR NEGATIVE NEGATIVE Final    Comment: (NOTE) SARS-CoV-2 target nucleic acids are NOT DETECTED.  The SARS-CoV-2 RNA is generally detectable in upper respiratoy specimens during the acute phase of infection. The lowest concentration of SARS-CoV-2 viral copies this assay can detect is 131 copies/mL. A negative result does not preclude SARS-Cov-2 infection and should not be used as the sole basis for treatment or other patient management decisions. A negative result may occur with  improper specimen collection/handling, submission of specimen other than nasopharyngeal swab, presence of viral mutation(s) within the areas targeted by this assay, and inadequate number of viral copies (<131 copies/mL). A negative result must be combined with clinical observations, patient history, and epidemiological information. The expected result is Negative.  Fact Sheet for Patients:  https://www.moore.com/https://www.fda.gov/media/142436/download  Fact Sheet for Healthcare Providers:  https://www.young.biz/https://www.fda.gov/media/142435/download  This test is no t yet approved or cleared by the [place] FDA and  has been authorized for detection and/or diagnosis of SARS-CoV-2 by FDA under an Emergency Use Authorization (EUA). This EUA will remain  in effect (meaning this test can be used) for  the duration of the COVID-19 declaration under Section 564(b)(1) of the Act, 21 U.S.C. section 360bbb-3(b)(1), unless the authorization is terminated or revoked sooner.     Influenza A by PCR NEGATIVE NEGATIVE Final   Influenza B by PCR NEGATIVE NEGATIVE Final    Comment: (NOTE) The Xpert Xpress SARS-CoV-2/FLU/RSV assay is intended as an aid in  the diagnosis of influenza from Nasopharyngeal swab specimens and  should not be used as a sole basis for treatment. Nasal washings and  aspirates are unacceptable for Xpert Xpress SARS-CoV-2/FLU/RSV  testing.  Fact Sheet for Patients: https://www.moore.com/https://www.fda.gov/media/142436/download  Fact Sheet for Healthcare Providers: https://www.young.biz/https://www.fda.gov/media/142435/download  This test is not yet approved or cleared by the [place] FDA and  has been authorized for detection and/or diagnosis of SARS-CoV-2 by  FDA under an Emergency Use Authorization (EUA). This EUA will remain  in effect (meaning this test can be used) for the duration of the  Covid-19 declaration under Section 564(b)(1) of the Act, 21  U.S.C. section 360bbb-3(b)(1), unless  the authorization is  terminated or revoked. Performed at [facility], [address] [place], [place] [address]   Aerobic/Anaerobic Culture (surgical/deep wound)     Status: None (Preliminary result)   Collection Time: 03/16/20  5:43 PM   Specimen: Bone; Tissue  Result Value Ref Range Status   Specimen Description   Final    BONE Performed at [facility], [address] [place], [place] [address]    Special Requests   Final    NONE Performed at [facility], [address] [place], [place] [address]    Gram Stain   Final    MODERATE WBC PRESENT,BOTH PMN AND MONONUCLEAR FEW GRAM POSITIVE COCCI RARE GRAM NEGATIVE RODS FEW GRAM VARIABLE ROD Performed at [facility], [address], [place], [place] [address]    Culture   Final    FEW GROUP B  STREP(S.AGALACTIAE)ISOLATED TESTING AGAINST S. AGALACTIAE NOT ROUTINELY PERFORMED DUE TO PREDICTABILITY OF AMP/PEN/VAN SUSCEPTIBILITY. NO ANAEROBES ISOLATED; CULTURE IN PROGRESS FOR 5 DAYS    Report Status PENDING  Incomplete         Radiology Studies: PERIPHERAL VASCULAR CATHETERIZATION  Result Date: 03/18/2020 See Op note       Scheduled Meds:  amLODipine  10 mg Oral Daily   citalopram  20 mg Oral Daily   cloNIDine  0.1 mg Oral QHS   clopidogrel  75 mg Oral Q breakfast   enoxaparin (LOVENOX) injection  40 mg Subcutaneous Q24H   gabapentin  300 mg Oral BID   insulin aspart  0-15 Units Subcutaneous TID WC   insulin aspart  0-5 Units Subcutaneous QHS   living well with diabetes book   Does not apply Once   mometasone-formoterol  2 puff Inhalation BID   mupirocin ointment  1 application Nasal BID   nicotine  21 mg Transdermal Daily   pantoprazole  40 mg Oral Daily   rosuvastatin  5 mg Oral Daily   sodium chloride flush  3 mL Intravenous Q12H   Continuous Infusions:  sodium chloride 75 mL/hr at 03/19/20 2125   sodium chloride     ampicillin-sulbactam (UNASYN) IV 3 g (03/20/20 0307)    Assessment & Plan:   Active Problems:   Essential hypertension   Smoking   Type 2 diabetes mellitus with diabetic neuropathy, unspecified (HCC)   Osteomyelitis (HCC)   Infection of left hallux Left foot cellulitis MRI completed found with acute osteomyelitis of the distal tuft of the left great toe distal phalanx.Possible small abscesses or phlegmon exchange Podiatry following -status post amputation left great toe and I&D abscess left dorsal forefoot on 9/29  10/1-status post angiogram but, status post angioplasty left peroneal and tibioperoneal trunk and stent placement of left superficial femoral artery 10/1 done by Dr. Gilda Crease -wound cx with Strep Gp B .  -Per IDs recommendation ampicillin plus sulbactam started we will continue. Per Dr. Graciela Husbands once ischemia  demarcates, he will need further surgical intervention  Dressing changed today by podiatry.   Patient to be partial weightbearing with heel contact and surgical shoe  Needs daily dressing changes  Per podiatry currently appears to be improving that he may not require any further surgery but will reassess tomorrow and discuss pathology report with Dr. Graciela Husbands.     Hypertension Controlled with increased dose of amlodipine  Continue clonidine     Tobacco abuse On nicotine patch  Was counseled extensively about smoking cessation during this hospitalization      Emphysema-stable  without acute exacerbation  Continue inhalers      Type 2 diabetes mellitus, poor control Patient on Metformin and glipizide Last hemoglobin A1c 9.3 on 01/13/2020 Oral agents on hold  10/3-blood glucose levels remained stable  Continue R ISS, hypoglycemic protocol     Hyperlipidemia Continue crestor  Anxiety/depression Continue on Celexa  Continue nightly Catapres    GERD PPI    DVT prophylaxis: lovenox Code Status: Full Family Communication: None at bedside  Status is: Inpatient  Remains inpatient appropriate because:IV treatments appropriate due to intensity of illness or inability to take PO   Dispo: The patient is from: Home              Anticipated d/c is to: Home              Anticipated d/c date is: 2              Patient currently is not medically stable to d/c.Plan for more surgical intervention possibly, continue on iv abx           LOS: 5 days   Time spent: 35 min with more than 50% on COC    Lynn Ito, MD [facility] Pager [phone]  If 7PM-7AM, please contact night-coverage [email or web address] Password [email or web address] 03/20/2020, 8:02 AM

## 2020-03-20 NOTE — Progress Notes (Signed)
2 Days Post-Op   Subjective/Chief Complaint: Patient seen.  Patient resting in bed comfortably today.  Patient denies any pain currently to the left lower extremity in the area of the amputation site.  Patient states that he is feeling decent and better than he did prior to his admission.  Objective: Vital signs in last 24 hours: Temp:  [98 F (36.7 C)-99.3 F (37.4 C)] 98 F (36.7 C) (10/03 0807) Pulse Rate:  [63-78] 69 (10/03 0807) Resp:  [16-18] 16 (10/03 0807) BP: (128-158)/(72-90) 132/88 (10/03 0807) SpO2:  [94 %-100 %] 96 % (10/03 0807) Last BM Date:  (PTA, pt doesn't recall)  Intake/Output from previous day: 10/02 0701 - 10/03 0700 In: 320 [P.O.:120; IV Piggyback:200] Out: 2625 [Urine:2625] Intake/Output this shift: No intake/output data recorded.  Physical examination: Left foot partial hallux amputation site appears to be well coapted with sutures intact.  Erythema and edema appears to be improving over this area compared to last visit.  Capillary fill time does appear to be present currently to the flaps dorsally and plantarly.  Appears much improved since previous visit overall.  No purulence expressible.    Lab Results:  Recent Labs    03/19/20 0430  WBC 8.1  HGB 11.8*  HCT 33.0*  PLT 335   BMET Recent Labs    03/18/20 0536 03/19/20 0430  NA 134* 134*  K 3.7 3.5  CL 100 97*  CO2 25 26  GLUCOSE 176* 149*  BUN 9 9  CREATININE 0.61 0.74  CALCIUM 8.5* 8.8*   PT/INR No results for input(s): LABPROT, INR in the last 72 hours. ABG No results for input(s): PHART, HCO3 in the last 72 hours.  Invalid input(s): PCO2, PO2  Studies/Results: PERIPHERAL VASCULAR CATHETERIZATION  Result Date: 03/18/2020 See Op note   Anti-infectives: Anti-infectives (From admission, onward)   Start     Dose/Rate Route Frequency Ordered Stop   03/18/20 1230  Ampicillin-Sulbactam (UNASYN) 3 g in sodium chloride 0.9 % 100 mL IVPB        3 g 200 mL/hr over 30 Minutes  Intravenous Every 6 hours 03/18/20 1140     03/18/20 1100  clindamycin (CLEOCIN) IVPB 300 mg  Status:  Discontinued       Note to Pharmacy: To be given in specials   300 mg 100 mL/hr over 30 Minutes Intravenous  Once 03/18/20 1056 03/18/20 1138   03/18/20 0821  clindamycin (CLEOCIN) 300 MG/50ML IVPB       Note to Pharmacy: Maynor, Erin   : cabinet override      03/18/20 0821 03/18/20 0947   03/17/20 1200  cefTRIAXone (ROCEPHIN) 2 g in sodium chloride 0.9 % 100 mL IVPB  Status:  Discontinued        2 g 200 mL/hr over 30 Minutes Intravenous Every 24 hours 03/17/20 1101 03/18/20 1140   03/16/20 2000  vancomycin (VANCOREADY) IVPB 1250 mg/250 mL  Status:  Discontinued        1,250 mg 166.7 mL/hr over 90 Minutes Intravenous Every 12 hours 03/16/20 1311 03/17/20 1810   03/16/20 0600  vancomycin (VANCOCIN) IVPB 1000 mg/200 mL premix  Status:  Discontinued        1,000 mg 200 mL/hr over 60 Minutes Intravenous Every 8 hours 03/15/20 1726 03/16/20 1311   03/15/20 1800  vancomycin (VANCOCIN) IVPB 1000 mg/200 mL premix        1,000 mg 200 mL/hr over 60 Minutes Intravenous  Once 03/15/20 1725 03/15/20 2103   03/15/20 1600  vancomycin (VANCOCIN) IVPB 1000 mg/200 mL premix        1,000 mg 200 mL/hr over 60 Minutes Intravenous  Once 03/15/20 1554 03/15/20 1823   03/15/20 1600  meropenem (MERREM) 1 g in sodium chloride 0.9 % 100 mL IVPB  Status:  Discontinued        1 g 200 mL/hr over 30 Minutes Intravenous Every 8 hours 03/15/20 1554 03/16/20 1301      Assessment/Plan: s/p Procedure(s): Lower Extremity Angiography (Left) Assessment: Stable status post amputation and I&D left foot   Plan: -Patient seen and examined today. -Erythema and edema as well as purplish/violaceous hue appears to be improved overall since previous visit.  Capillary fill time appears to be improving to the areas of the flaps dorsally and plantarly compared to the last visit. -Incision line appears to be intact with sutures  intact, no expressible purulence, no fluctuance. -Redressed with Betadine wet-to-dry dressing.  Still recommend daily dressing changes. -Patient to be partial weightbearing with heel contact and surgical shoe. -Continue antibiotics per medicine.  White blood cell count appears to be within normal limits.  Patient afebrile with vital signs stable. -We will continue to allow this area to demarcate but currently appears to be improving so much so that he may not require any further surgery. -We will reassess tomorrow and discuss pathology report with Dr. Alberteen Spindle in detail.  Appears to show potentially acute inflammation at the margin of the amputation site.  Intraoperative wound culture growing strep agalactiae, sensitivity pending.   LOS: 5 days    Rosetta Posner, [facility] 03/20/2020

## 2020-03-21 ENCOUNTER — Ambulatory Visit (HOSPITAL_COMMUNITY): Payer: Self-pay | Admitting: Licensed Clinical Social Worker

## 2020-03-21 LAB — GLUCOSE, CAPILLARY
Glucose-Capillary: 243 mg/dL — ABNORMAL HIGH (ref 70–99)
Glucose-Capillary: 234 mg/dL — ABNORMAL HIGH (ref 70–99)
Glucose-Capillary: 163 mg/dL — ABNORMAL HIGH (ref 70–99)
Glucose-Capillary: 231 mg/dL — ABNORMAL HIGH (ref 70–99)

## 2020-03-21 MED ORDER — MAGNESIUM OXIDE 400 (241.3 MG) MG PO TABS
400.0000 mg | ORAL_TABLET | Freq: Every day | ORAL | Status: DC
  Administered 2020-03-21 – 2020-03-23 (×3): 400 mg via ORAL
  Filled 2020-03-21 (×3): qty 1

## 2020-03-21 NOTE — Progress Notes (Signed)
3 Days Post-Op   Subjective/Chief Complaint: Patient seen.  Not really complaining of much pain.  States he is ready to go home.   Objective: Vital signs in last 24 hours: Temp:  [98.4 F (36.9 C)-98.9 F (37.2 C)] 98.5 F (36.9 C) (10/04 0808) Pulse Rate:  [62-73] 62 (10/04 0808) Resp:  [15-18] 15 (10/04 0808) BP: (137-153)/(81-89) 142/88 (10/04 1005) SpO2:  [93 %-96 %] 96 % (10/04 0808) Last BM Date: 03/20/20  Intake/Output from previous day: 10/03 0701 - 10/04 0700 In: 1757.5 [P.O.:120; I.V.:1437.5; IV Piggyback:200] Out: 2050 [Urine:2050] Intake/Output this shift: Total I/O In: -  Out: 700 [Urine:700]  Bandage on the left foot is dry and intact.  Upon removal only minimal drainage.  Incision is fairly well coapted with some continued cyanotic and early gangrenous changes, especially along the dorsal flap.  Erythema significantly improved.    Lab Results:  Recent Labs    03/19/20 0430  WBC 8.1  HGB 11.8*  HCT 33.0*  PLT 335   BMET Recent Labs    03/19/20 0430  NA 134*  K 3.5  CL 97*  CO2 26  GLUCOSE 149*  BUN 9  CREATININE 0.74  CALCIUM 8.8*   PT/INR No results for input(s): LABPROT, INR in the last 72 hours. ABG No results for input(s): PHART, HCO3 in the last 72 hours.  Invalid input(s): PCO2, PO2  Studies/Results: No results found.  Anti-infectives: Anti-infectives (From admission, onward)   Start     Dose/Rate Route Frequency Ordered Stop   03/18/20 1230  Ampicillin-Sulbactam (UNASYN) 3 g in sodium chloride 0.9 % 100 mL IVPB        3 g 200 mL/hr over 30 Minutes Intravenous Every 6 hours 03/18/20 1140     03/18/20 1100  clindamycin (CLEOCIN) IVPB 300 mg  Status:  Discontinued       Note to Pharmacy: To be given in specials   300 mg 100 mL/hr over 30 Minutes Intravenous  Once 03/18/20 1056 03/18/20 1138   03/18/20 0821  clindamycin (CLEOCIN) 300 MG/50ML IVPB       Note to Pharmacy: Maynor, Erin   : cabinet override      03/18/20 0821  03/18/20 0947   03/17/20 1200  cefTRIAXone (ROCEPHIN) 2 g in sodium chloride 0.9 % 100 mL IVPB  Status:  Discontinued        2 g 200 mL/hr over 30 Minutes Intravenous Every 24 hours 03/17/20 1101 03/18/20 1140   03/16/20 2000  vancomycin (VANCOREADY) IVPB 1250 mg/250 mL  Status:  Discontinued        1,250 mg 166.7 mL/hr over 90 Minutes Intravenous Every 12 hours 03/16/20 1311 03/17/20 1810   03/16/20 0600  vancomycin (VANCOCIN) IVPB 1000 mg/200 mL premix  Status:  Discontinued        1,000 mg 200 mL/hr over 60 Minutes Intravenous Every 8 hours 03/15/20 1726 03/16/20 1311   03/15/20 1800  vancomycin (VANCOCIN) IVPB 1000 mg/200 mL premix        1,000 mg 200 mL/hr over 60 Minutes Intravenous  Once 03/15/20 1725 03/15/20 2103   03/15/20 1600  vancomycin (VANCOCIN) IVPB 1000 mg/200 mL premix        1,000 mg 200 mL/hr over 60 Minutes Intravenous  Once 03/15/20 1554 03/15/20 1823   03/15/20 1600  meropenem (MERREM) 1 g in sodium chloride 0.9 % 100 mL IVPB  Status:  Discontinued        1 g 200 mL/hr over 30 Minutes Intravenous  Every 8 hours 03/15/20 1554 03/16/20 1301      Assessment/Plan: s/p Procedure(s): Lower Extremity Angiography (Left) Assessment: Osteomyelitis with gangrenous changes status post amputation left hallux.   Plan: Betadine and sterile bandage reapplied to the left foot.  Discussed with the patient pathology results which revealed some continued inflammation at the cut end of the bone as well as soft tissues.  Discussed that with the appearance of his toe I think it would be in his best interest to go ahead and proceed with a partial first ray resection to try to excise all devitalized tissue and give him his best chance for healing.  Discussed possible risks and complications with the procedure including inability to heal due to infection, his diabetes, or his peripheral vascular disease.  No guarantees could be given as to the outcome of his surgery.  Patient elects to proceed  with surgery.  N.p.o. after midnight.  Consent for first ray resection left foot.  Plan for surgery tomorrow around lunchtime.  LOS: 6 days    Ricci Barker 03/21/2020

## 2020-03-21 NOTE — Progress Notes (Signed)
PROGRESS NOTE    Timothy [name].  HCW:[id number] DOB: 1960-11-22 DOA: 03/15/2020 PCP: Rolm Gala, NP    Brief Narrative:  Timothy [name]. is a 59 y.o. male with medical history significant of hypertension, type 2 diabetes mellitus, hypercholesterolemia, tobacco abuse, depression/anxiety who presents to the ED for evaluation of a infection and his left hallux. Podiatry was consulted. MRI completed.   10/2- pt had angiogram with intervention yesterday by vascular 10/3-redressed with Betadine wet-to-dry dressing by podiatry.  Podiatry recommends daily dressing changes.   Consultants:   Podiatry  Procedures:  MRI 1. Acute osteomyelitis of the distal tuft of the left great toe distal phalanx. 2. Fluid and scattered foci of susceptibility within the subungual region of the great toe. Additional fluid collection along the plantar surface of the great toe distal phalanx measuring 1.4 x 0.9 x 1.6 cm, which may represent a small abscesses or phlegmonous change. 3. Minimal subchondral bone marrow edema within the great toe proximal phalanx adjacent to the IP joint with preservation of the fatty T1 bone marrow signal. Findings may be reactive. Early osteomyelitis not excluded. 4. Edema-like signal throughout the intrinsic foot musculature suggesting a nonspecific myositis  Antimicrobials:    vancomycin   Subjective: Pt sleepy, has no complaints.    Objective: Vitals:   03/20/20 1522 03/20/20 2032 03/21/20 0029 03/21/20 0808  BP: (!) 142/86 (!) 153/89 137/83 140/81  Pulse: 67 73 66 62  Resp: 16 18  15   Temp: 98.4 F (36.9 C) 98.9 F (37.2 C) 98.6 F (37 C) 98.5 F (36.9 C)  TempSrc: Oral Oral Oral Oral  SpO2: 96% 93% 96% 96%  Weight:      Height:        Intake/Output Summary (Last 24 hours) at 03/21/2020 0813 Last data filed at 03/20/2020 2354 Gross per 24 hour  Intake 1757.5 ml  Output 2050 ml  Net -292.5 ml   Filed Weights   03/15/20 1519  03/18/20 0850  Weight: 84.8 kg 84.8 kg    Examination:  Sleepy, nad, comfortable cta no w/r/r rrr s1/s2 no m/r Soft nt/nd +bs LLE+edema, wrapped in dressing RLE no edema aaxox3     Data Reviewed: I have personally reviewed following labs and imaging studies  CBC: Recent Labs  Lab 03/15/20 1531 03/16/20 0555 03/19/20 0430  WBC 9.9 8.0 8.1  NEUTROABS 7.0  --   --   HGB 13.3 12.8* 11.8*  HCT 37.5* 36.2* 33.0*  MCV 97.2 96.5 95.1  PLT 308 273 335   Basic Metabolic Panel: Recent Labs  Lab 03/15/20 1531 03/16/20 0555 03/17/20 0449 03/18/20 0536 03/19/20 0430  NA 132* 133*  --  134* 134*  K 3.7 3.5 3.7 3.7 3.5  CL 97* 95*  --  100 97*  CO2 24 26  --  25 26  GLUCOSE 236* 225*  --  176* 149*  BUN 17 13  --  9 9  CREATININE 0.77 0.79 0.75 0.61 0.74  CALCIUM 9.1 8.9  --  8.5* 8.8*   GFR: Estimated Creatinine Clearance: 103.9 mL/min (by C-G formula based on SCr of 0.74 mg/dL). Liver Function Tests: Recent Labs  Lab 03/15/20 1531 03/16/20 0555  AST 21 17  ALT 21 18  ALKPHOS 99 92  BILITOT 0.7 0.7  PROT 7.9 7.4  ALBUMIN 3.8 3.4*   No results for input(s): LIPASE, AMYLASE in the last 168 hours. No results for input(s): AMMONIA in the last 168 hours. Coagulation Profile: Recent Labs  Lab 03/16/20 0555  INR 0.9   Cardiac Enzymes: No results for input(s): CKTOTAL, CKMB, CKMBINDEX, TROPONINI in the last 168 hours. BNP (last 3 results) No results for input(s): PROBNP in the last 8760 hours. HbA1C: No results for input(s): HGBA1C in the last 72 hours. CBG: Recent Labs  Lab 03/19/20 2009 03/20/20 0807 03/20/20 1155 03/20/20 1633 03/20/20 2034  GLUCAP 184* 196* 189* 171* 245*   Lipid Profile: No results for input(s): CHOL, HDL, LDLCALC, TRIG, CHOLHDL, LDLDIRECT in the last 72 hours. Thyroid Function Tests: No results for input(s): TSH, T4TOTAL, FREET4, T3FREE, THYROIDAB in the last 72 hours. Anemia Panel: No results for input(s): VITAMINB12,  FOLATE, FERRITIN, TIBC, IRON, RETICCTPCT in the last 72 hours. Sepsis Labs: Recent Labs  Lab 03/15/20 1531  LATICACIDVEN 1.3    Recent Results (from the past 240 hour(s))  Respiratory Panel by RT PCR (Flu A&B, Covid) - Nasopharyngeal Swab     Status: None   Collection Time: 03/15/20  4:55 PM   Specimen: Nasopharyngeal Swab  Result Value Ref Range Status   SARS Coronavirus 2 by RT PCR NEGATIVE NEGATIVE Final    Comment: (NOTE) SARS-CoV-2 target nucleic acids are NOT DETECTED.  The SARS-CoV-2 RNA is generally detectable in upper respiratoy specimens during the acute phase of infection. The lowest concentration of SARS-CoV-2 viral copies this assay can detect is 131 copies/mL. A negative result does not preclude SARS-Cov-2 infection and should not be used as the sole basis for treatment or other patient management decisions. A negative result may occur with  improper specimen collection/handling, submission of specimen other than nasopharyngeal swab, presence of viral mutation(s) within the areas targeted by this assay, and inadequate number of viral copies (<131 copies/mL). A negative result must be combined with clinical observations, patient history, and epidemiological information. The expected result is Negative.  Fact Sheet for Patients:  https://www.moore.com/  Fact Sheet for Healthcare Providers:  https://www.young.biz/  This test is no t yet approved or cleared by the [place] FDA and  has been authorized for detection and/or diagnosis of SARS-CoV-2 by FDA under an Emergency Use Authorization (EUA). This EUA will remain  in effect (meaning this test can be used) for the duration of the COVID-19 declaration under Section 564(b)(1) of the Act, 21 U.S.C. section 360bbb-3(b)(1), unless the authorization is terminated or revoked sooner.     Influenza A by PCR NEGATIVE NEGATIVE Final   Influenza B by PCR NEGATIVE NEGATIVE Final      Comment: (NOTE) The Xpert Xpress SARS-CoV-2/FLU/RSV assay is intended as an aid in  the diagnosis of influenza from Nasopharyngeal swab specimens and  should not be used as a sole basis for treatment. Nasal washings and  aspirates are unacceptable for Xpert Xpress SARS-CoV-2/FLU/RSV  testing.  Fact Sheet for Patients: https://www.moore.com/  Fact Sheet for Healthcare Providers: https://www.young.biz/  This test is not yet approved or cleared by the [place] FDA and  has been authorized for detection and/or diagnosis of SARS-CoV-2 by  FDA under an Emergency Use Authorization (EUA). This EUA will remain  in effect (meaning this test can be used) for the duration of the  Covid-19 declaration under Section 564(b)(1) of the Act, 21  U.S.C. section 360bbb-3(b)(1), unless the authorization is  terminated or revoked. Performed at [facility], [address] [place], [place] [address]   Aerobic/Anaerobic Culture (surgical/deep wound)     Status: None (Preliminary result)   Collection Time: 03/16/20  5:43 PM   Specimen: Bone; Tissue  Result Value Ref Range Status   Specimen Description   Final    BONE Performed at [facility], [address] [place], [place] [address]    Special Requests   Final    NONE Performed at [facility], [address] [place], [place] [address]    Gram Stain   Final    MODERATE WBC PRESENT,BOTH PMN AND MONONUCLEAR FEW GRAM POSITIVE COCCI RARE GRAM NEGATIVE RODS FEW GRAM VARIABLE ROD Performed at [facility], [address] [place], [place] [address]    Culture   Final    FEW GROUP B STREP(S.AGALACTIAE)ISOLATED TESTING AGAINST S. AGALACTIAE NOT ROUTINELY PERFORMED DUE TO PREDICTABILITY OF AMP/PEN/VAN SUSCEPTIBILITY. NO ANAEROBES ISOLATED; CULTURE IN PROGRESS FOR 5 DAYS    Report Status PENDING  Incomplete         Radiology Studies: No results  found.      Scheduled Meds: . amLODipine  10 mg Oral Daily  . citalopram  20 mg Oral Daily  . cloNIDine  0.1 mg Oral QHS  . clopidogrel  75 mg Oral Q breakfast  . enoxaparin (LOVENOX) injection  40 mg Subcutaneous Q24H  . gabapentin  300 mg Oral BID  . insulin aspart  0-15 Units Subcutaneous TID WC  . insulin aspart  0-5 Units Subcutaneous QHS  . living well with diabetes book   Does not apply Once  . mometasone-formoterol  2 puff Inhalation BID  . mupirocin ointment  1 application Nasal BID  . nicotine  21 mg Transdermal Daily  . pantoprazole  40 mg Oral Daily  . rosuvastatin  5 mg Oral Daily  . sodium chloride flush  3 mL Intravenous Q12H   Continuous Infusions: . sodium chloride    . ampicillin-sulbactam (UNASYN) IV 3 g (03/21/20 0428)    Assessment & Plan:   Active Problems:   Essential hypertension   Smoking   Type 2 diabetes mellitus with diabetic neuropathy, unspecified (HCC)   Osteomyelitis (HCC)   Infection of left hallux Left foot cellulitis MRI completed found with acute osteomyelitis of the distal tuft of the left great toe distal phalanx.Possible small abscesses or phlegmon exchange Podiatry following -status post amputation left great toe and I&D abscess left dorsal forefoot on 9/29  10/1-status post angiogram but, status post angioplasty left peroneal and tibioperoneal trunk and stent placement of left superficial femoral artery 10/1 done by Dr. Gilda CreaseSchnier -wound cx with Strep Gp B .  -ID following -recommended ampicillin plus sulbactam, will follow up with any new recommendations  Dr. Graciela HusbandsKlein awaiting for ischemia demarcation, he will need further surgical intervention, dressing was changed yesterday by podiatry.   Patient to be partial weightbearing with heel contact and surgical shoe  Continue daily dressing changes  Awaiting for podiatry to make any further recommendations any further recommendations any further recommendations       Hypertension Improving, continue with amlodipine and clonidine     Tobacco abuse Was counseled extensively about smoking cessation during this hospitalization  Continue nicotine patch     Emphysema-stable without acute exacerbation  Continue inhalers    Type 2 diabetes mellitus, poor control Patient on Metformin and glipizide Last hemoglobin A1c 9.3 on 01/13/2020 Oral agents on hold  10/3-blood glucose levels remained stable  Continue R ISS, hypoglycemic protocol     Hyperlipidemia Continue crestor  Anxiety/depression Continue on Celexa  Continue nightly Catapres    GERD PPI    DVT prophylaxis: lovenox Code Status: Full Family Communication: None  at bedside  Status is: Inpatient  Remains inpatient appropriate because:IV treatments appropriate due to intensity of illness or inability to take PO   Dispo: The patient is from: Home              Anticipated d/c is to: Home              Anticipated d/c date is: 2              Patient currently is not medically stable to d/c.Plan for more surgical intervention possibly, continue on iv abx.           LOS: 6 days   Time spent: 35 min with more than 50% on COC    Lynn Ito, MD [facility] Pager [phone]  If 7PM-7AM, please contact night-coverage [email or web address] Password [email or web address] 03/21/2020, 8:13 AM

## 2020-03-21 NOTE — Progress Notes (Signed)
ID Pt says he wants to go home But knows that his foot infection not optimized yet especialy with necrosis   O/e awake and alert Patient Vitals for the past 24 hrs:  BP Temp Temp src Pulse Resp SpO2  03/21/20 1640 (!) 141/85 97.8 F (36.6 C) Oral 65 16 94 %  03/21/20 1005 (!) 142/88 -- -- -- -- --  03/21/20 0808 140/81 98.5 F (36.9 C) Oral 62 15 96 %  03/21/20 0029 137/83 98.6 F (37 C) Oral 66 -- 96 %  03/20/20 2032 (!) 153/89 98.9 F (37.2 C) Oral 73 18 93 %   Chest b/l air entry [id number] Abd soft Left leg dressing not removed but picture reviewed     CBC Latest Ref Rng & Units 03/19/2020 03/16/2020 03/15/2020  WBC 4.0 - 10.5 K/uL 8.1 8.0 9.9  Hemoglobin 13.0 - 17.0 g/dL 11.8(L) 12.8(L) 13.3  Hematocrit 39 - 52 % 33.0(L) 36.2(L) 37.5(L)  Platelets 150 - 400 K/uL 335 273 308    CMP Latest Ref Rng & Units 03/19/2020 03/18/2020 03/17/2020  Glucose 70 - 99 mg/dL 366(Q) 947(M) -  BUN 6 - 20 mg/dL 9 9 -  Creatinine 5.46 - 1.24 mg/dL 5.03 5.46 5.68  Sodium 135 - 145 mmol/L 134(L) 134(L) -  Potassium 3.5 - 5.1 mmol/L 3.5 3.7 3.7  Chloride 98 - 111 mmol/L 97(L) 100 -  CO2 22 - 32 mmol/L 26 25 -  Calcium 8.9 - 10.3 mg/dL 1.2(X) 5.1(Z) -  Total Protein 6.5 - 8.1 g/dL - - -  Total Bilirubin 0.3 - 1.2 mg/dL - - -  Alkaline Phos 38 - 126 U/L - - -  AST 15 - 41 U/L - - -  ALT 0 - 44 U/L - - -   Micro 03/16/20 Surgical wound culture Group B strep   Pathology TOE, LEFT GREAT; AMPUTATION:  - ACUTE OSTEOMYELITIS.  - ULCERATION, ACUTE SUPPURATIVE INFLAMMATION, AND GANGRENOUS NECROSIS.  - ACUTE INFLAMMATION PRESENT AT BONE AND SOFT TISSUE MARGINS   Impression/recommendation  Left great toe infection- s/p partial amputation Ischemia and necrosis underwent angio on 03/18/20 and had PTA and stent placement of left superficial femoral artery and angioplasty of left peroneal and tibioperoneal trunk 0n 03/18/20  Pathology of proximal margin shows osteo- Dr.Clinie planning to take him for  ray excision because of ongoing infection and ischemic necrosis Pt has Group B strep in the wound and is currently on Unasyn to cover GBS, anerobes and gram neg  Diabetes mellitus -poorly controlled- currently on insulin  HTn on amlodipine and clonidine  Smoker- spoke to him about quitting to help with wound healing  Hyperlipidemia on rosuvastatin hypertriglyceridemia  Discussed the management with the patient

## 2020-03-21 NOTE — Plan of Care (Signed)
  Problem: Education: Goal: Knowledge of General Education information will improve Description: Including pain rating scale, medication(s)/side effects and non-pharmacologic comfort measures Outcome: Progressing   Problem: Health Behavior/Discharge Planning: Goal: Ability to manage health-related needs will improve Outcome: Progressing   Problem: Clinical Measurements: Goal: Ability to maintain clinical measurements within normal limits will improve Outcome: Progressing Goal: Will remain free from infection Outcome: Progressing   Problem: Activity: Goal: Risk for activity intolerance will decrease Outcome: Progressing   Problem: Nutrition: Goal: Adequate nutrition will be maintained Outcome: Progressing   Problem: Coping: Goal: Level of anxiety will decrease Outcome: Progressing   Problem: Pain Managment: Goal: General experience of comfort will improve Outcome: Progressing   

## 2020-03-22 ENCOUNTER — Encounter: Payer: Self-pay | Admitting: Vascular Surgery

## 2020-03-22 ENCOUNTER — Inpatient Hospital Stay: Payer: Self-pay | Admitting: Registered Nurse

## 2020-03-22 ENCOUNTER — Encounter
Admission: EM | Disposition: A | Discharge status: Home / Self Care | Payer: Self-pay | Source: Home / Self Care | Attending: Internal Medicine

## 2020-03-22 HISTORY — PX: AMPUTATION: SHX166

## 2020-03-22 LAB — CBC
HCT: 34.8 % — ABNORMAL LOW (ref 39.0–52.0)
Hemoglobin: 12.2 g/dL — ABNORMAL LOW (ref 13.0–17.0)
MCH: 33.3 pg (ref 26.0–34.0)
MCHC: 35.1 g/dL (ref 30.0–36.0)
MCV: 95.1 fL (ref 80.0–100.0)
Platelets: 464 10*3/uL — ABNORMAL HIGH (ref 150–400)
RBC: 3.66 MIL/uL — ABNORMAL LOW (ref 4.22–5.81)
RDW: 12.3 % (ref 11.5–15.5)
WBC: 6.9 10*3/uL (ref 4.0–10.5)
nRBC: 0 % (ref 0.0–0.2)

## 2020-03-22 LAB — BASIC METABOLIC PANEL
Anion gap: 8 (ref 5–15)
BUN: 13 mg/dL (ref 6–20)
CO2: 28 mmol/L (ref 22–32)
Calcium: 9 mg/dL (ref 8.9–10.3)
Chloride: 98 mmol/L (ref 98–111)
Creatinine, Ser: 0.75 mg/dL (ref 0.61–1.24)
GFR calc Af Amer: >60 mL/min (ref >60)
GFR calc non Af Amer: >60 mL/min (ref >60)
Glucose, Bld: 236 mg/dL — ABNORMAL HIGH (ref 70–99)
Potassium: 3.6 mmol/L (ref 3.5–5.1)
Sodium: 134 mmol/L — ABNORMAL LOW (ref 135–145)

## 2020-03-22 LAB — GLUCOSE, CAPILLARY
Glucose-Capillary: 270 mg/dL — ABNORMAL HIGH (ref 70–99)
Glucose-Capillary: 271 mg/dL — ABNORMAL HIGH (ref 70–99)
Glucose-Capillary: 272 mg/dL — ABNORMAL HIGH (ref 70–99)
Glucose-Capillary: 200 mg/dL — ABNORMAL HIGH (ref 70–99)
Glucose-Capillary: 213 mg/dL — ABNORMAL HIGH (ref 70–99)
Glucose-Capillary: 316 mg/dL — ABNORMAL HIGH (ref 70–99)

## 2020-03-22 LAB — SURGICAL PCR SCREEN
MRSA, PCR: NEGATIVE
Staphylococcus aureus: NEGATIVE

## 2020-03-22 SURGERY — AMPUTATION, FOOT, RAY
Anesthesia: General | Laterality: Left

## 2020-03-22 MED ORDER — KETOROLAC TROMETHAMINE 30 MG/ML IJ SOLN
INTRAMUSCULAR | Status: AC
  Filled 2020-03-22: qty 1

## 2020-03-22 MED ORDER — NEOMYCIN-POLYMYXIN B GU 40-200000 IR SOLN
Status: DC | PRN
  Administered 2020-03-22: 2 mL

## 2020-03-22 MED ORDER — LIDOCAINE HCL (PF) 2 % IJ SOLN
INTRAMUSCULAR | Status: AC
  Filled 2020-03-22: qty 5

## 2020-03-22 MED ORDER — INSULIN ASPART 100 UNIT/ML ~~LOC~~ SOLN: 5.0000 [IU] | Freq: Once | SUBCUTANEOUS | Status: AC

## 2020-03-22 MED ORDER — PHENYLEPHRINE HCL (PRESSORS) 10 MG/ML IV SOLN
INTRAVENOUS | Status: DC | PRN
  Administered 2020-03-22 (×2): 100 ug via INTRAVENOUS

## 2020-03-22 MED ORDER — DEXAMETHASONE SODIUM PHOSPHATE 10 MG/ML IJ SOLN
INTRAMUSCULAR | Status: AC
  Filled 2020-03-22: qty 1

## 2020-03-22 MED ORDER — ONDANSETRON HCL 4 MG/2ML IJ SOLN: 4.0000 mg | Freq: Once | INTRAMUSCULAR | Status: DC | PRN

## 2020-03-22 MED ORDER — TRAZODONE HCL 50 MG PO TABS
50.0000 mg | ORAL_TABLET | Freq: Every evening | ORAL | Status: DC | PRN
  Administered 2020-03-22: 50 mg via ORAL
  Filled 2020-03-22: qty 1

## 2020-03-22 MED ORDER — KETOROLAC TROMETHAMINE 30 MG/ML IJ SOLN
INTRAMUSCULAR | Status: DC | PRN
  Administered 2020-03-22: 30 mg via INTRAVENOUS

## 2020-03-22 MED ORDER — INSULIN GLARGINE 100 UNIT/ML ~~LOC~~ SOLN
5.0000 [IU] | Freq: Every day | SUBCUTANEOUS | Status: DC
  Administered 2020-03-22: 5 [IU] via SUBCUTANEOUS
  Filled 2020-03-22 (×2): qty 0.05

## 2020-03-22 MED ORDER — ROCURONIUM BROMIDE 10 MG/ML (PF) SYRINGE
PREFILLED_SYRINGE | INTRAVENOUS | Status: AC
  Filled 2020-03-22: qty 10

## 2020-03-22 MED ORDER — PROPOFOL 10 MG/ML IV BOLUS
INTRAVENOUS | Status: DC | PRN
  Administered 2020-03-22: 200 mg via INTRAVENOUS

## 2020-03-22 MED ORDER — DEXAMETHASONE SODIUM PHOSPHATE 10 MG/ML IJ SOLN
INTRAMUSCULAR | Status: DC | PRN
  Administered 2020-03-22: 4 mg via INTRAVENOUS

## 2020-03-22 MED ORDER — BUPIVACAINE HCL 0.5 % IJ SOLN
INTRAMUSCULAR | Status: DC | PRN
  Administered 2020-03-22: 10 mL

## 2020-03-22 MED ORDER — ONDANSETRON HCL 4 MG/2ML IJ SOLN
INTRAMUSCULAR | Status: DC | PRN
  Administered 2020-03-22: 4 mg via INTRAVENOUS

## 2020-03-22 MED ORDER — MIDAZOLAM HCL 2 MG/2ML IJ SOLN
INTRAMUSCULAR | Status: AC
  Filled 2020-03-22: qty 2

## 2020-03-22 MED ORDER — NEOMYCIN-POLYMYXIN B GU 40-200000 IR SOLN
Status: AC
  Filled 2020-03-22: qty 20

## 2020-03-22 MED ORDER — FENTANYL CITRATE (PF) 100 MCG/2ML IJ SOLN
INTRAMUSCULAR | Status: DC | PRN
  Administered 2020-03-22: 25 ug via INTRAVENOUS

## 2020-03-22 MED ORDER — BUPIVACAINE HCL (PF) 0.5 % IJ SOLN
INTRAMUSCULAR | Status: AC
  Filled 2020-03-22: qty 30

## 2020-03-22 MED ORDER — FENTANYL CITRATE (PF) 100 MCG/2ML IJ SOLN
INTRAMUSCULAR | Status: AC
  Filled 2020-03-22: qty 2

## 2020-03-22 MED ORDER — INSULIN ASPART 100 UNIT/ML ~~LOC~~ SOLN
SUBCUTANEOUS | Status: AC
  Administered 2020-03-22: 5 [IU] via SUBCUTANEOUS
  Filled 2020-03-22: qty 1

## 2020-03-22 MED ORDER — MIDAZOLAM HCL 2 MG/2ML IJ SOLN
INTRAMUSCULAR | Status: DC | PRN
  Administered 2020-03-22: 2 mg via INTRAVENOUS

## 2020-03-22 MED ORDER — LIDOCAINE HCL (CARDIAC) PF 100 MG/5ML IV SOSY
PREFILLED_SYRINGE | INTRAVENOUS | Status: DC | PRN
  Administered 2020-03-22: 80 mg via INTRAVENOUS

## 2020-03-22 MED ORDER — PROPOFOL 10 MG/ML IV BOLUS
INTRAVENOUS | Status: AC
  Filled 2020-03-22: qty 40

## 2020-03-22 MED ORDER — INSULIN ASPART 100 UNIT/ML ~~LOC~~ SOLN
3.0000 [IU] | Freq: Once | SUBCUTANEOUS | Status: AC
  Administered 2020-03-22: 3 [IU] via SUBCUTANEOUS
  Filled 2020-03-22: qty 1

## 2020-03-22 MED ORDER — MUPIROCIN 2 % EX OINT
1.0000 "application " | TOPICAL_OINTMENT | Freq: Two times a day (BID) | CUTANEOUS | Status: DC
  Administered 2020-03-22 – 2020-03-23 (×2): 1 via NASAL
  Filled 2020-03-22: qty 22

## 2020-03-22 MED ORDER — FENTANYL CITRATE (PF) 100 MCG/2ML IJ SOLN: 25.0000 ug | INTRAMUSCULAR | Status: DC | PRN

## 2020-03-22 SURGICAL SUPPLY — 47 items
BLADE MED AGGRESSIVE (BLADE) ×6 IMPLANT
BLADE OSC/SAGITTAL MD 5.5X18 (BLADE) ×3 IMPLANT
BLADE SURG 15 STRL LF DISP TIS (BLADE) ×2 IMPLANT
BLADE SURG 15 STRL SS (BLADE) ×4
BLADE SURG MINI STRL (BLADE) ×3 IMPLANT
BNDG CONFORM 2 STRL LF (GAUZE/BANDAGES/DRESSINGS) ×3 IMPLANT
BNDG ELASTIC 4X5.8 VLCR STR LF (GAUZE/BANDAGES/DRESSINGS) ×3 IMPLANT
BNDG ESMARK 4X12 TAN STRL LF (GAUZE/BANDAGES/DRESSINGS) ×3 IMPLANT
BNDG GAUZE 4.5X4.1 6PLY STRL (MISCELLANEOUS) ×3 IMPLANT
CANISTER SUCT 1200ML W/VALVE (MISCELLANEOUS) IMPLANT
CLOSURE WOUND 1/4X4 (GAUZE/BANDAGES/DRESSINGS) ×1
COVER WAND RF STERILE (DRAPES) ×3 IMPLANT
CUFF TOURN DUAL PL 12 NO SLV (MISCELLANEOUS) ×3 IMPLANT
CUFF TOURN SGL QUICK 18X4 (TOURNIQUET CUFF) ×3 IMPLANT
DRAPE FLUOR MINI C-ARM 54X84 (DRAPES) IMPLANT
DURAPREP 26ML APPLICATOR (WOUND CARE) ×3 IMPLANT
ELECT REM PT RETURN 9FT ADLT (ELECTROSURGICAL) ×3
ELECTRODE REM PT RTRN 9FT ADLT (ELECTROSURGICAL) ×1 IMPLANT
GAUZE SPONGE 4X4 12PLY STRL (GAUZE/BANDAGES/DRESSINGS) ×3 IMPLANT
GAUZE XEROFORM 1X8 LF (GAUZE/BANDAGES/DRESSINGS) ×6 IMPLANT
GLOVE BIO SURGEON STRL SZ7.5 (GLOVE) ×3 IMPLANT
GLOVE INDICATOR 8.0 STRL GRN (GLOVE) ×3 IMPLANT
GOWN STRL REUS W/ TWL LRG LVL3 (GOWN DISPOSABLE) ×2 IMPLANT
GOWN STRL REUS W/TWL LRG LVL3 (GOWN DISPOSABLE) ×4
HANDPIECE VERSAJET DEBRIDEMENT (MISCELLANEOUS) ×3 IMPLANT
KIT TURNOVER KIT A (KITS) ×3 IMPLANT
LABEL OR SOLS (LABEL) ×3 IMPLANT
NEEDLE FILTER BLUNT 18X 1/2SAF (NEEDLE) ×2
NEEDLE FILTER BLUNT 18X1 1/2 (NEEDLE) ×1 IMPLANT
NEEDLE HYPO 25X1 1.5 SAFETY (NEEDLE) ×6 IMPLANT
NS IRRIG 500ML POUR BTL (IV SOLUTION) ×3 IMPLANT
PACK EXTREMITY (MISCELLANEOUS) ×3 IMPLANT
PAD ABD DERMACEA PRESS 5X9 (GAUZE/BANDAGES/DRESSINGS) ×3 IMPLANT
SOL .9 NS 3000ML IRR  AL (IV SOLUTION) ×2
SOL .9 NS 3000ML IRR UROMATIC (IV SOLUTION) ×1 IMPLANT
SOL PREP PVP 2OZ (MISCELLANEOUS) ×3
SOLUTION PREP PVP 2OZ (MISCELLANEOUS) ×1 IMPLANT
STOCKINETTE BIAS CUT 4 980044 (GAUZE/BANDAGES/DRESSINGS) ×3 IMPLANT
STOCKINETTE STRL 6IN 960660 (GAUZE/BANDAGES/DRESSINGS) ×3 IMPLANT
STRIP CLOSURE SKIN 1/4X4 (GAUZE/BANDAGES/DRESSINGS) ×2 IMPLANT
SUT ETHILON 3-0 FS-10 30 BLK (SUTURE) ×3
SUT ETHILON 4-0 (SUTURE) ×2
SUT ETHILON 4-0 FS2 18XMFL BLK (SUTURE) ×1
SUTURE EHLN 3-0 FS-10 30 BLK (SUTURE) ×1 IMPLANT
SUTURE ETHLN 4-0 FS2 18XMF BLK (SUTURE) ×1 IMPLANT
SWAB DUAL CULTURE TRANS RED ST (MISCELLANEOUS) IMPLANT
SYR 10ML LL (SYRINGE) ×3 IMPLANT

## 2020-03-22 NOTE — Progress Notes (Signed)
15 minute call to floor. 

## 2020-03-22 NOTE — Progress Notes (Signed)
Inpatient Diabetes Program Recommendations  AACE/ADA: New Consensus Statement on Inpatient Glycemic Control (2015)  Target Ranges:  Prepandial:   less than 140 mg/dL      Peak postprandial:   less than 180 mg/dL (1-2 hours)      Critically ill patients:  140 - 180 mg/dL   Lab Results  Component Value Date   GLUCAP 200 (H) 03/22/2020   HGBA1C 8.5 (H) 03/15/2020    Review of Glycemic Control  CBGs in 200s today. May benefit from addition of basal insulin.  Current orders for Inpatient glycemic control: Novolog 0-15 units tidwc and 0-5 units QHS  Inpatient Diabetes Program Recommendations:     Consider adding Levemir 15 units QHS  Will continue to follow. Secure text sent to MD.  Thank you. Ailene Ards, RD, LDN, CDE Inpatient Diabetes Coordinator [phone]

## 2020-03-22 NOTE — Anesthesia Preprocedure Evaluation (Signed)
Anesthesia Evaluation  Patient identified by MRN, date of birth, ID band Patient awake    Reviewed: Allergy & Precautions, NPO status , Patient's Chart, lab work & pertinent test results  History of Anesthesia Complications Negative for: history of anesthetic complications  Airway Mallampati: III       Dental  (+) Poor Dentition, Chipped   Pulmonary neg sleep apnea, COPD, Current Smoker,           Cardiovascular hypertension, Pt. on medications (-) Past MI and (-) CHF (-) dysrhythmias (-) Valvular Problems/Murmurs     Neuro/Psych neg Seizures Anxiety    GI/Hepatic Neg liver ROS, GERD  Medicated and Controlled,  Endo/Other  diabetes, Type 2, Oral Hypoglycemic Agents  Renal/GU negative Renal ROS     Musculoskeletal   Abdominal   Peds  Hematology   Anesthesia Other Findings   Reproductive/Obstetrics                             Anesthesia Physical  Anesthesia Plan  ASA: III and emergent  Anesthesia Plan: General   Post-op Pain Management:    Induction:   PONV Risk Score and Plan: 1 and Ondansetron  Airway Management Planned: LMA  Additional Equipment:   Intra-op Plan:   Post-operative Plan:   Informed Consent: I have reviewed the patients History and Physical, chart, labs and discussed the procedure including the risks, benefits and alternatives for the proposed anesthesia with the patient or authorized representative who has indicated his/her understanding and acceptance.     Dental advisory given  Plan Discussed with: CRNA and Surgeon  Anesthesia Plan Comments:         Anesthesia Quick Evaluation

## 2020-03-22 NOTE — Anesthesia Procedure Notes (Signed)
Procedure Name: LMA Insertion Date/Time: 03/22/2020 1:01 PM Performed by: Felix Ahmadi, RN Pre-anesthesia Checklist: Patient identified, Emergency Drugs available, Suction available, Patient being monitored and Timeout performed Patient Re-evaluated:Patient Re-evaluated prior to induction Oxygen Delivery Method: Simple face mask Preoxygenation: Pre-oxygenation with 100% oxygen Induction Type: IV induction Ventilation: Mask ventilation without difficulty LMA: LMA inserted LMA Size: 4.5 Number of attempts: 1

## 2020-03-22 NOTE — Plan of Care (Signed)
  Problem: Education: Goal: Knowledge of General Education information will improve Description: Including pain rating scale, medication(s)/side effects and non-pharmacologic comfort measures Outcome: Progressing   Problem: Health Behavior/Discharge Planning: Goal: Ability to manage health-related needs will improve Outcome: Progressing   Problem: Clinical Measurements: Goal: Ability to maintain clinical measurements within normal limits will improve Outcome: Progressing Goal: Will remain free from infection Outcome: Progressing   Problem: Activity: Goal: Risk for activity intolerance will decrease Outcome: Progressing   Problem: Nutrition: Goal: Adequate nutrition will be maintained Outcome: Progressing   Problem: Coping: Goal: Level of anxiety will decrease Outcome: Progressing   Problem: Pain Managment: Goal: General experience of comfort will improve Outcome: Progressing   

## 2020-03-22 NOTE — Op Note (Signed)
Date of operation: 03/22/2020.  Surgeon: Ricci Barker D.P.M.  Preoperative diagnosis: Osteomyelitis with gangrenous changes left first ray.  Postoperative diagnosis: Same.  Procedure: Partial first ray resection left foot.  Anesthesia: LMA with local.  Hemostasis: Pneumatic tourniquet left ankle 250 mmHg.  Estimated blood loss: Less than 5 cc.  Pathology: Left first ray.  Complications: None apparent.  Operative indications: This is a 59 year old male with recent partial amputation of his left great toe.  Postoperatively had continued gangrenous changes as well as pathology that showed inflammation at the resected end of the proximal phalanx bone.  Decision was made for revision first ray resection to try to eliminate infection and remove gangrenous tissue.  Operative procedure: Patient was taken to the operating room and placed on the table in the supine position.  Following satisfactory LMA anesthesia a pneumatic tourniquet was applied at the level of the left ankle.  10 cc of 0.5% Marcaine plain was then injected around the first ray.  The foot was prepped and draped in the usual sterile fashion.  The foot was exsanguinated and the tourniquet inflated to 250 mmHg.    Attention was directed to the left first ray where a racquet type incision was made from the medial aspect of the first metatarsal around the dorsal and plantar base of the first MTPJ area trying to stay proximal to the gangrenous changes.  The incision was carried sharply down to bone and then dissection carried back along the metatarsal shaft.  The distal aspect of the first metatarsal was incised using a sagittal saw and the remainder of the first ray was removed in toto.  The wound was then thoroughly debrided with a versa jet debrider on a setting of 3 and flushed with copious amounts of sterile saline.  Incision was closed using 3-0 nylon simple interrupted and vertical mattress suture.  Xeroform 4 x 4's ABD and Kerlix  applied to the left lower extremity.  Tourniquet was released.  A second Kerlix and Ace wrap then applied for compression.  Patient tolerated the procedure and anesthesia well and was transported to the PACU with vital signs stable and in good condition.

## 2020-03-22 NOTE — Progress Notes (Signed)
PROGRESS NOTE    Timothy SillWarren B Kroboth Jr.  ZOX:[id number] DOB: 08-04-1960 DOA: 03/15/2020 PCP: Rolm GalaIloabachie, Chioma E, NP    Brief Narrative:  Timothy SillWarren B Rudie Jr. is a 59 y.o. male with medical history significant of hypertension, type 2 diabetes mellitus, hypercholesterolemia, tobacco abuse, depression/anxiety who presents to the ED for evaluation of a infection and his left hallux. Podiatry was consulted. MRI completed.   10/2- pt had angiogram with intervention yesterday by vascular 10/3-redressed with Betadine wet-to-dry dressing by podiatry.  Podiatry recommends daily dressing changes.   Consultants:   Podiatry  Procedures:  MRI 1. Acute osteomyelitis of the distal tuft of the left great toe distal phalanx. 2. Fluid and scattered foci of susceptibility within the subungual region of the great toe. Additional fluid collection along the plantar surface of the great toe distal phalanx measuring 1.4 x 0.9 x 1.6 cm, which may represent a small abscesses or phlegmonous change. 3. Minimal subchondral bone marrow edema within the great toe proximal phalanx adjacent to the IP joint with preservation of the fatty T1 bone marrow signal. Findings may be reactive. Early osteomyelitis not excluded. 4. Edema-like signal throughout the intrinsic foot musculature suggesting a nonspecific myositis  Antimicrobials:    vancomycin   Subjective: No complaints this AM.  No pain.  Plan is to go to the OR this afternoon  Objective: Vitals:   03/22/20 0357 03/22/20 0732 03/22/20 1132 03/22/20 1154  BP: (!) 149/76 139/81 (!) 147/85 (!) 143/85  Pulse: 63 (!) 58 64 63  Resp: 17 18 16    Temp: 98 F (36.7 C) 98.2 F (36.8 C) 98.1 F (36.7 C) 97.6 F (36.4 C)  TempSrc:      SpO2: 95% 96% 96% 92%  Weight:      Height:        Intake/Output Summary (Last 24 hours) at 03/22/2020 1207 Last data filed at 03/22/2020 0729 Gross per 24 hour  Intake 800 ml  Output --  Net 800 ml   Filed Weights    03/15/20 1519 03/18/20 0850  Weight: 84.8 kg 84.8 kg    Examination: Calm, Comfortable, NAD CTA no wheeze rales rhonchi's RRR S1-S2 no murmurs rubs gallops Soft nontender nondistended positive bowel sounds Left lower extremity dressing in place, right lower extremity no edema Alert oriented x3    Data Reviewed: I have personally reviewed following labs and imaging studies  CBC: Recent Labs  Lab 03/15/20 1531 03/16/20 0555 03/19/20 0430 03/22/20 0344  WBC 9.9 8.0 8.1 6.9  NEUTROABS 7.0  --   --   --   HGB 13.3 12.8* 11.8* 12.2*  HCT 37.5* 36.2* 33.0* 34.8*  MCV 97.2 96.5 95.1 95.1  PLT 308 273 335 464*   Basic Metabolic Panel: Recent Labs  Lab 03/15/20 1531 03/15/20 1531 03/16/20 0555 03/17/20 0449 03/18/20 0536 03/19/20 0430 03/22/20 0344  NA 132*  --  133*  --  134* 134* 134*  K 3.7   < > 3.5 3.7 3.7 3.5 3.6  CL 97*  --  95*  --  100 97* 98  CO2 24  --  26  --  25 26 28   GLUCOSE 236*  --  225*  --  176* 149* 236*  BUN 17  --  13  --  9 9 13   CREATININE 0.77   < > 0.79 0.75 0.61 0.74 0.75  CALCIUM 9.1  --  8.9  --  8.5* 8.8* 9.0   < > = values in this interval  not displayed.   GFR: Estimated Creatinine Clearance: 103.9 mL/min (by C-G formula based on SCr of 0.75 mg/dL). Liver Function Tests: Recent Labs  Lab 03/15/20 1531 03/16/20 0555  AST 21 17  ALT 21 18  ALKPHOS 99 92  BILITOT 0.7 0.7  PROT 7.9 7.4  ALBUMIN 3.8 3.4*   No results for input(s): LIPASE, AMYLASE in the last 168 hours. No results for input(s): AMMONIA in the last 168 hours. Coagulation Profile: Recent Labs  Lab 03/16/20 0555  INR 0.9   Cardiac Enzymes: No results for input(s): CKTOTAL, CKMB, CKMBINDEX, TROPONINI in the last 168 hours. BNP (last 3 results) No results for input(s): PROBNP in the last 8760 hours. HbA1C: No results for input(s): HGBA1C in the last 72 hours. CBG: Recent Labs  Lab 03/21/20 1126 03/21/20 1642 03/21/20 2107 03/22/20 0730 03/22/20 1133   GLUCAP 234* 163* 231* 270* 272*   Lipid Profile: No results for input(s): CHOL, HDL, LDLCALC, TRIG, CHOLHDL, LDLDIRECT in the last 72 hours. Thyroid Function Tests: No results for input(s): TSH, T4TOTAL, FREET4, T3FREE, THYROIDAB in the last 72 hours. Anemia Panel: No results for input(s): VITAMINB12, FOLATE, FERRITIN, TIBC, IRON, RETICCTPCT in the last 72 hours. Sepsis Labs: Recent Labs  Lab 03/15/20 1531  LATICACIDVEN 1.3    Recent Results (from the past 240 hour(s))  Respiratory Panel by RT PCR (Flu A&B, Covid) - Nasopharyngeal Swab     Status: None   Collection Time: 03/15/20  4:55 PM   Specimen: Nasopharyngeal Swab  Result Value Ref Range Status   SARS Coronavirus 2 by RT PCR NEGATIVE NEGATIVE Final    Comment: (NOTE) SARS-CoV-2 target nucleic acids are NOT DETECTED.  The SARS-CoV-2 RNA is generally detectable in upper respiratoy specimens during the acute phase of infection. The lowest concentration of SARS-CoV-2 viral copies this assay can detect is 131 copies/mL. A negative result does not preclude SARS-Cov-2 infection and should not be used as the sole basis for treatment or other patient management decisions. A negative result may occur with  improper specimen collection/handling, submission of specimen other than nasopharyngeal swab, presence of viral mutation(s) within the areas targeted by this assay, and inadequate number of viral copies (<131 copies/mL). A negative result must be combined with clinical observations, patient history, and epidemiological information. The expected result is Negative.  Fact Sheet for Patients:  https://www.moore.com/  Fact Sheet for Healthcare Providers:  https://www.young.biz/  This test is no t yet approved or cleared by the [place] FDA and  has been authorized for detection and/or diagnosis of SARS-CoV-2 by FDA under an Emergency Use Authorization (EUA). This EUA will remain  in  effect (meaning this test can be used) for the duration of the COVID-19 declaration under Section 564(b)(1) of the Act, 21 U.S.C. section 360bbb-3(b)(1), unless the authorization is terminated or revoked sooner.     Influenza A by PCR NEGATIVE NEGATIVE Final   Influenza B by PCR NEGATIVE NEGATIVE Final    Comment: (NOTE) The Xpert Xpress SARS-CoV-2/FLU/RSV assay is intended as an aid in  the diagnosis of influenza from Nasopharyngeal swab specimens and  should not be used as a sole basis for treatment. Nasal washings and  aspirates are unacceptable for Xpert Xpress SARS-CoV-2/FLU/RSV  testing.  Fact Sheet for Patients: https://www.moore.com/  Fact Sheet for Healthcare Providers: https://www.young.biz/  This test is not yet approved or cleared by the [place] FDA and  has been authorized for detection and/or diagnosis of SARS-CoV-2 by  FDA under an Emergency Use  Authorization (EUA). This EUA will remain  in effect (meaning this test can be used) for the duration of the  Covid-19 declaration under Section 564(b)(1) of the Act, 21  U.S.C. section 360bbb-3(b)(1), unless the authorization is  terminated or revoked. Performed at [facility], [address] [place], [place] [address]   Aerobic/Anaerobic Culture (surgical/deep wound)     Status: None (Preliminary result)   Collection Time: 03/16/20  5:43 PM   Specimen: Bone; Tissue  Result Value Ref Range Status   Specimen Description   Final    BONE Performed at [facility], [address], [place], [place] [address]    Special Requests   Final    NONE Performed at [facility], [address] [place], [place] [address]    Gram Stain   Final    MODERATE WBC PRESENT,BOTH PMN AND MONONUCLEAR FEW GRAM POSITIVE COCCI RARE GRAM NEGATIVE RODS FEW GRAM VARIABLE ROD    Culture   Final    FEW GROUP B STREP(S.AGALACTIAE)ISOLATED TESTING AGAINST S.  AGALACTIAE NOT ROUTINELY PERFORMED DUE TO PREDICTABILITY OF AMP/PEN/VAN SUSCEPTIBILITY. NO ANAEROBES ISOLATED Performed at [facility], [address], [place], [place] [address]    Report Status PENDING  Incomplete         Radiology Studies: No results found.      Scheduled Meds: . [MAR Hold] amLODipine  10 mg Oral Daily  . [MAR Hold] citalopram  20 mg Oral Daily  . [MAR Hold] cloNIDine  0.1 mg Oral QHS  . [MAR Hold] clopidogrel  75 mg Oral Q breakfast  . [MAR Hold] gabapentin  300 mg Oral BID  . [MAR Hold] insulin aspart  0-15 Units Subcutaneous TID WC  . [MAR Hold] insulin aspart  0-5 Units Subcutaneous QHS  . [MAR Hold] living well with diabetes book   Does not apply Once  . [MAR Hold] magnesium oxide  400 mg Oral Daily  . [MAR Hold] mometasone-formoterol  2 puff Inhalation BID  . [MAR Hold] mupirocin ointment  1 application Nasal BID  . [MAR Hold] nicotine  21 mg Transdermal Daily  . [MAR Hold] pantoprazole  40 mg Oral Daily  . [MAR Hold] rosuvastatin  5 mg Oral Daily  . [MAR Hold] sodium chloride flush  3 mL Intravenous Q12H   Continuous Infusions: . [MAR Hold] sodium chloride    . [MAR Hold] ampicillin-sulbactam (UNASYN) IV 3 g (03/22/20 1054)    Assessment & Plan:   Active Problems:   Essential hypertension   Smoking   Type 2 diabetes mellitus with diabetic neuropathy, unspecified (HCC)   Osteomyelitis (HCC)   Infection of left hallux Left foot cellulitis MRI completed found with acute osteomyelitis of the distal tuft of the left great toe distal phalanx.Possible small abscesses or phlegmon exchange Podiatry following -status post amputation left great toe and I&D abscess left dorsal forefoot on 9/29  10/1-status post angiogram but, status post angioplasty left peroneal and tibioperoneal trunk and stent placement of left superficial femoral artery 10/1 done by Dr. Gilda Crease -wound cx with Strep Gp B .  -ID following -recommended ampicillin plus  sulbactam, 10/5-Per podiatry's note yesterday left foot with early gangrenous changes especially along the dorsal flap.  Erythema significantly improved.  Plan for partial first ray resection to try to excise all devitalized tissue and give him the best chance for healing to be done today.  His plan to go to the OR today.  Hypertension More stable, continue with amlodipine and clonidine.       Tobacco abuse was counseled extensively about smoking cessation during this hospitalization  Continue with nicotine patch    Emphysema-stable without acute exacerbation  Continue with inhalers    Type 2 diabetes mellitus, poor control Patient on Metformin and glipizide A1C 8.5 Continue to hold oral agents.  Continue with RISS Post op if po intake is good and BG elevated, will likely need to start on lantus 5 qhs. But will monitor for now.    Hyperlipidemia Continue on statins  Anxiety/depression Continue on Celexa On nightly Catapres   GERD PPI    DVT prophylaxis: lovenox Code Status: Full Family Communication: None at bedside  Status is: Inpatient  Remains inpatient appropriate because:IV treatments appropriate due to intensity of illness or inability to take PO   Dispo: The patient is from: Home              Anticipated d/c is to: Home              Anticipated d/c date is: 2              Patient currently is not medically stable to d/c.Surgery today.          LOS: 7 days   Time spent: 35 min with more than 50% on COC    Lynn Ito, MD [facility] Pager [phone]  If 7PM-7AM, please contact night-coverage [email or web address] Password [email or web address] 03/22/2020, 12:07 PM

## 2020-03-22 NOTE — Transfer of Care (Signed)
Immediate Anesthesia Transfer of Care Note  Patient: Timothy [name].  Procedure(s) Performed: AMPUTATION RAY-1st Ray Left Foot (Left )  Patient Location: PACU  Anesthesia Type:General  Level of Consciousness: awake, drowsy and patient cooperative  Airway & Oxygen Therapy: Patient Spontanous Breathing and Patient connected to face mask oxygen  Post-op Assessment: Report given to RN and Post -op Vital signs reviewed and stable  Post vital signs: Reviewed and stable  Last Vitals:  Vitals Value Taken Time  BP 93/61 03/22/20 1358  Temp 36.4 C 03/22/20 1358  Pulse 64 03/22/20 1403  Resp 6 03/22/20 1403  SpO2 100 % 03/22/20 1403  Vitals shown include unvalidated device data.  Last Pain:  Vitals:   03/22/20 1358  TempSrc:   PainSc: 0-No pain         Complications: No complications documented.

## 2020-03-23 ENCOUNTER — Other Ambulatory Visit: Payer: Self-pay | Admitting: Internal Medicine

## 2020-03-23 ENCOUNTER — Encounter: Payer: Self-pay | Admitting: Podiatry

## 2020-03-23 DIAGNOSIS — E785 Hyperlipidemia, unspecified: Secondary | ICD-10-CM

## 2020-03-23 LAB — CBC
HCT: 35.1 % — ABNORMAL LOW (ref 39.0–52.0)
Hemoglobin: 12.9 g/dL — ABNORMAL LOW (ref 13.0–17.0)
MCH: 33.9 pg (ref 26.0–34.0)
MCHC: 36.8 g/dL — ABNORMAL HIGH (ref 30.0–36.0)
MCV: 92.4 fL (ref 80.0–100.0)
Platelets: 478 10*3/uL — ABNORMAL HIGH (ref 150–400)
RBC: 3.8 MIL/uL — ABNORMAL LOW (ref 4.22–5.81)
RDW: 12.2 % (ref 11.5–15.5)
WBC: 10.6 10*3/uL — ABNORMAL HIGH (ref 4.0–10.5)
nRBC: 0 % (ref 0.0–0.2)

## 2020-03-23 LAB — BASIC METABOLIC PANEL
Anion gap: 11 (ref 5–15)
BUN: 20 mg/dL (ref 6–20)
CO2: 24 mmol/L (ref 22–32)
Calcium: 9.1 mg/dL (ref 8.9–10.3)
Chloride: 98 mmol/L (ref 98–111)
Creatinine, Ser: 0.87 mg/dL (ref 0.61–1.24)
GFR calc non Af Amer: >60 mL/min (ref >60)
Glucose, Bld: 258 mg/dL — ABNORMAL HIGH (ref 70–99)
Potassium: 4.3 mmol/L (ref 3.5–5.1)
Sodium: 133 mmol/L — ABNORMAL LOW (ref 135–145)

## 2020-03-23 LAB — GLUCOSE, CAPILLARY
Glucose-Capillary: 231 mg/dL — ABNORMAL HIGH (ref 70–99)
Glucose-Capillary: 193 mg/dL — ABNORMAL HIGH (ref 70–99)

## 2020-03-23 MED ORDER — AMOXICILLIN-POT CLAVULANATE 875-125 MG PO TABS: 1.0000 | ORAL_TABLET | Freq: Two times a day (BID) | ORAL | 0 refills | Status: DC

## 2020-03-23 MED ORDER — CLOPIDOGREL BISULFATE 75 MG PO TABS
75.0000 mg | ORAL_TABLET | Freq: Every day | ORAL | 0 refills | Status: DC
Start: 2020-03-24 — End: 2020-04-20

## 2020-03-23 MED ORDER — OXYCODONE HCL 10 MG PO TABS
10.0000 mg | ORAL_TABLET | ORAL | 0 refills | Status: DC | PRN
Start: 2020-03-23 — End: 2020-06-29

## 2020-03-23 MED ORDER — ENOXAPARIN SODIUM 40 MG/0.4ML ~~LOC~~ SOLN: 40.0000 mg | SUBCUTANEOUS | Status: DC

## 2020-03-23 MED ORDER — OXYCODONE HCL 5 MG PO TABS
10.0000 mg | ORAL_TABLET | ORAL | Status: DC | PRN
  Administered 2020-03-23: 10 mg via ORAL
  Filled 2020-03-23: qty 2

## 2020-03-23 NOTE — Anesthesia Postprocedure Evaluation (Signed)
Anesthesia Post Note  Patient: Timothy [name].  Procedure(s) Performed: AMPUTATION RAY-1st Ray Left Foot (Left )  Patient location during evaluation: PACU Anesthesia Type: General Level of consciousness: awake and alert and oriented Pain management: pain level controlled Vital Signs Assessment: post-procedure vital signs reviewed and stable Respiratory status: spontaneous breathing Cardiovascular status: blood pressure returned to baseline Anesthetic complications: no   No complications documented.   Last Vitals:  Vitals:   03/23/20 0757 03/23/20 1010  BP: (!) 161/91 (!) 143/79  Pulse: 66 64  Resp: 16   Temp: 36.5 C   SpO2: 100%     Last Pain:  Vitals:   03/23/20 0757  TempSrc: Oral  PainSc:                  Emmanual Gauthreaux

## 2020-03-23 NOTE — Progress Notes (Signed)
1 Day Post-Op   Subjective/Chief Complaint: Patient seen.  Relates some pain in his foot.  States he did not sleep at all last night.   Objective: Vital signs in last 24 hours: Temp:  [97.6 F (36.4 C)-98.5 F (36.9 C)] 97.7 F (36.5 C) (10/06 0757) Pulse Rate:  [57-69] 64 (10/06 1010) Resp:  [12-18] 16 (10/06 0757) BP: (93-161)/(61-91) 143/79 (10/06 1010) SpO2:  [95 %-100 %] 100 % (10/06 0757) Last BM Date: 03/22/20  Intake/Output from previous day: 10/05 0701 - 10/06 0700 In: 1100 [I.V.:300; IV Piggyback:800] Out: 10 [Blood:10] Intake/Output this shift: No intake/output data recorded.  Heavy bleeding is noted on the bandaging with some strikethrough of the Ace wrap.  Upon removal the incision is well coapted and the wound edges appear viable and healthy.      Lab Results:  Recent Labs    03/22/20 0344 03/23/20 0307  WBC 6.9 10.6*  HGB 12.2* 12.9*  HCT 34.8* 35.1*  PLT 464* 478*   BMET Recent Labs    03/22/20 0344 03/23/20 0307  NA 134* 133*  K 3.6 4.3  CL 98 98  CO2 28 24  GLUCOSE 236* 258*  BUN 13 20  CREATININE 0.75 0.87  CALCIUM 9.0 9.1   PT/INR No results for input(s): LABPROT, INR in the last 72 hours. ABG No results for input(s): PHART, HCO3 in the last 72 hours.  Invalid input(s): PCO2, PO2  Studies/Results: No results found.  Anti-infectives: Anti-infectives (From admission, onward)   Start     Dose/Rate Route Frequency Ordered Stop   03/18/20 1230  Ampicillin-Sulbactam (UNASYN) 3 g in sodium chloride 0.9 % 100 mL IVPB        3 g 200 mL/hr over 30 Minutes Intravenous Every 6 hours 03/18/20 1140     03/18/20 1100  clindamycin (CLEOCIN) IVPB 300 mg  Status:  Discontinued       Note to Pharmacy: To be given in specials   300 mg 100 mL/hr over 30 Minutes Intravenous  Once 03/18/20 1056 03/18/20 1138   03/18/20 0821  clindamycin (CLEOCIN) 300 MG/50ML IVPB       Note to Pharmacy: Maynor, Erin   : cabinet override      03/18/20 0821  03/18/20 0947   03/17/20 1200  cefTRIAXone (ROCEPHIN) 2 g in sodium chloride 0.9 % 100 mL IVPB  Status:  Discontinued        2 g 200 mL/hr over 30 Minutes Intravenous Every 24 hours 03/17/20 1101 03/18/20 1140   03/16/20 2000  vancomycin (VANCOREADY) IVPB 1250 mg/250 mL  Status:  Discontinued        1,250 mg 166.7 mL/hr over 90 Minutes Intravenous Every 12 hours 03/16/20 1311 03/17/20 1810   03/16/20 0600  vancomycin (VANCOCIN) IVPB 1000 mg/200 mL premix  Status:  Discontinued        1,000 mg 200 mL/hr over 60 Minutes Intravenous Every 8 hours 03/15/20 1726 03/16/20 1311   03/15/20 1800  vancomycin (VANCOCIN) IVPB 1000 mg/200 mL premix        1,000 mg 200 mL/hr over 60 Minutes Intravenous  Once 03/15/20 1725 03/15/20 2103   03/15/20 1600  vancomycin (VANCOCIN) IVPB 1000 mg/200 mL premix        1,000 mg 200 mL/hr over 60 Minutes Intravenous  Once 03/15/20 1554 03/15/20 1823   03/15/20 1600  meropenem (MERREM) 1 g in sodium chloride 0.9 % 100 mL IVPB  Status:  Discontinued        1  g 200 mL/hr over 30 Minutes Intravenous Every 8 hours 03/15/20 1554 03/16/20 1301      Assessment/Plan: s/p Procedure(s): AMPUTATION RAY-1st Ray Left Foot (Left) Assessment: Stable status post first ray resection left foot.   Plan: Betadine and a sterile bandage applied to the left foot.  Patient is stable for discharge at this point.  Patient was seen along with infectious disease and should be okay for oral antibiotics.  Patient was instructed on limited weightbearing in his wedge shoe with pressure only on the heel.  Patient will keep the bandage on the left foot clean, dry, and do not remove.  Return to clinic on Monday for reevaluation.  If any bleeding through the bandage he will call and present to the clinic sooner.  LOS: 8 days    Ricci Barker 03/23/2020

## 2020-03-23 NOTE — Progress Notes (Signed)
Pt verbalized understanding of discharge instructions. Prescription given to pt. Belonging returned to patient. Encourage pt to go  Keep appointments. Escorted out by nursing staff

## 2020-03-23 NOTE — Progress Notes (Signed)
Inpatient Diabetes Program Recommendations  AACE/ADA: New Consensus Statement on Inpatient Glycemic Control (2015)  Target Ranges:  Prepandial:   less than 140 mg/dL      Peak postprandial:   less than 180 mg/dL (1-2 hours)      Critically ill patients:  140 - 180 mg/dL   Lab Results  Component Value Date   GLUCAP 193 (H) 03/23/2020   HGBA1C 8.5 (H) 03/15/2020    Review of Glycemic Control  Blood sugars: 231, 193 mg/dL. HgbA1C 8.5%, down from 9.3%  Inpatient Diabetes Program Recommendations:     Increase Lantus to 10 units QHS.  Thank you. Ailene Ards, RD, LDN, CDE Inpatient Diabetes Coordinator [phone]

## 2020-03-23 NOTE — Discharge Summary (Signed)
Physician Discharge Summary  Patient ID: Timothy [name]. MRN: [id number] DOB/AGE: Sep 16, 1960 59 y.o.  Admit date: 03/15/2020 Discharge date: 03/23/2020  Admission Diagnoses:  Discharge Diagnoses:  Active Problems:   Essential hypertension   Smoking   Type 2 diabetes mellitus with diabetic neuropathy, unspecified ([place])   Osteomyelitis ([place])   Discharged Condition: good  Hospital Course: Timothy Lewisis a 59 y.o.malewith medical history significant ofhypertension, type 2 diabetes mellitus, hypercholesterolemia, tobacco abuse, depression/anxiety who presents to the ED for evaluation of a infection and his left hallux. Podiatry was consulted. MRI showed osteomyelitis of left great toe.  Patient also had occlusive disease in bilateral lower extremity with gangrene on the left foot.  Patient had a left lower extremity arterial angiogram, percutaneous transluminal angioplasty of the left peroneal and tibial peroneal trunk was performed and a drug-eluting stent placed.  Patient was still Plavix afterwards.  Patient had a partial first ray resection of the left foot 10/5 by Dr. Cleda Mccreedy.  Patient initially was treated with Unasyn, and followed by infectious disease.  Condition had improved after surgery.  At this point, patient is medically stable to be discharged.  We will continue 2 weeks of Augmentin per recommendation from infectious disease.  #1.  Left foot cellulitis, osteomyelitis of the left great toe secondary to group B streptococcus infection, with associated peripheral arterial disease. Patient is status post drug-eluting stent, Ray resection of the left great toe. We will continue antibiotics with Augmentin for 2 weeks.  Continue pain medicine as needed.  Patient be followed with podiatry in 2 weeks, followed with vascular surgeon and PCP as scheduled.  #2.  Uncontrolled type 2 diabetes with hyperglycemia. Hemoglobin A1c 8.5.  I resume patient Metformin and glipizide.   Patient need to follow-up with PCP to intensify patient diabetic treatment.  3.  COPD. Stable.  4.  Hypertension. Resume all home medicines.   Consults: ID, vascular surgery and Podiatry  Significant Diagnostic Studies: MRI OF THE LEFT FOREFOOT WITHOUT AND WITH CONTRAST  TECHNIQUE: Multiplanar, multisequence MR imaging of the left forefoot was performed both before and after administration of intravenous contrast.  CONTRAST:  76m GADAVIST GADOBUTROL 1 MMOL/ML IV SOLN  COMPARISON:  X-ray 03/15/2020  FINDINGS: Bones/Joint/Cartilage  Loss of cortical definition of the distal tuft of the great toe distal phalanx. There is marrow edema throughout the distal phalanx with associated confluent low T1 signal compatible with acute osteomyelitis. Minimal subchondral bone marrow edema within the great toe proximal phalanx adjacent to the IP joint with preservation of the fatty T1 bone marrow signal. Remaining osseous structures of the forefoot are normal in appearance and signal. No acute fracture. No malalignment. Trace first MTP joint effusion.  Ligaments  Intact Lisfranc ligament. Collateral ligaments of the forefoot intact.  Muscles and Tendons  Edema-like signal throughout the intrinsic foot musculature suggesting a nonspecific myositis. Intact flexor and extensor tendons. No significant tenosynovial fluid collection peer  Soft tissues  There is fluid and scattered foci of susceptibility within the subungual region of the great toe. Additional fluid collection is present along the plantar surface of the great toe distal phalanx measuring 1.4 x 0.9 x 1.6 cm (series 8, image 11; series 5, image 8). There is surrounding soft tissue edema at the level of the great toe. Mild diffuse dorsal subcutaneous edema of the forefoot.  IMPRESSION: 1. Acute osteomyelitis of the distal tuft of the left great toe distal phalanx. 2. Fluid and scattered foci of  susceptibility within the  subungual region of the great toe. Additional fluid collection along the plantar surface of the great toe distal phalanx measuring 1.4 x 0.9 x 1.6 cm, which may represent a small abscesses or phlegmonous change. 3. Minimal subchondral bone marrow edema within the great toe proximal phalanx adjacent to the IP joint with preservation of the fatty T1 bone marrow signal. Findings may be reactive. Early osteomyelitis not excluded. 4. Edema-like signal throughout the intrinsic foot musculature suggesting a nonspecific myositis.  These findings are in agreement with the preliminary report issued by Lucretia Roers, MD. The preliminary findings were called to Rufina Falco, NP at 9:32 p.m. on 03/15/2020 by the professional radiology assistant.   Electronically Signed   By: Davina Poke D.O.   On: 03/16/2020 08:13       Treatments: Unasyn   Discharge Exam: Blood pressure (!) 143/79, pulse 64, temperature 97.7 F (36.5 C), temperature source Oral, resp. rate 16, height '5\' 10"'  (1.778 m), weight 84.8 kg, SpO2 100 %. General appearance: alert and cooperative Resp: clear to auscultation bilaterally Cardio: regular rate and rhythm, S1, S2 normal, no murmur, click, rub or gallop GI: soft, non-tender; bowel sounds normal; no masses,  no organomegaly Extremities: extremities normal, atraumatic, no cyanosis or edema  Disposition: Discharge disposition: 01-Home or Self Care       Discharge Instructions    Diet - low sodium heart healthy   Complete by: As directed    Discharge wound care:   Complete by: As directed    Follow with podiatry   Increase activity slowly   Complete by: As directed      Allergies as of 03/23/2020      Reactions   Bee Venom Anaphylaxis   Coffee Flavor Shortness Of Breath   Cheese Swelling   Blue Cheese only      Medication List    TAKE these medications   albuterol 108 (90 Base) MCG/ACT inhaler Commonly known as:  VENTOLIN HFA Inhale 1 puff into the lungs every 6 (six) hours as needed for wheezing or shortness of breath.   amLODipine 5 MG tablet Commonly known as: NORVASC TAKE ONE TABLET BY MOUTH EVERY DAY   amoxicillin-clavulanate 875-125 MG tablet Commonly known as: Augmentin Take 1 tablet by mouth 2 (two) times daily for 14 days.   ARTHRITIS PAIN PO Take 1,000 mg by mouth daily.   blood glucose meter kit and supplies Kit Dispense based on patient and insurance preference. Use up to four times daily as directed. (FOR ICD-9 250.00, 250.01).   calcium carbonate 500 MG chewable tablet Commonly known as: TUMS - dosed in mg elemental calcium Chew by mouth.   citalopram 20 MG tablet Commonly known as: CELEXA Take 1 tablet (20 mg total) by mouth daily.   cloNIDine 0.1 MG tablet Commonly known as: CATAPRES Take 1 tablet (0.1 mg total) by mouth at bedtime.   clopidogrel 75 MG tablet Commonly known as: PLAVIX Take 1 tablet (75 mg total) by mouth daily with breakfast. Start taking on: March 24, 2020   esomeprazole 20 MG capsule Commonly known as: NEXIUM Take 1 capsule (20 mg total) by mouth daily at 12 noon. What changed: when to take this   Fluticasone-Salmeterol 250-50 MCG/DOSE Aepb Commonly known as: ADVAIR Inhale 1 puff into the lungs 2 (two) times daily.   gabapentin 300 MG capsule Commonly known as: NEURONTIN Take 1 capsule (300 mg total) by mouth 2 (two) times daily.   glipiZIDE 5 MG tablet Commonly known as: GLUCOTROL  Take 0.5 tablets (2.5 mg total) by mouth 2 (two) times daily before a meal.   Magnesium 400 MG Caps Take 400 mg by mouth daily.   metFORMIN 1000 MG tablet Commonly known as: Glucophage Take 1 tablet (1,000 mg total) by mouth 2 (two) times daily with a meal.   Oxycodone HCl 10 MG Tabs Take 1 tablet (10 mg total) by mouth every 4 (four) hours as needed for moderate pain.   rosuvastatin 5 MG tablet Commonly known as: CRESTOR Take 1 tablet (5 mg total)  by mouth daily.            Discharge Care Instructions  (From admission, onward)         Start     Ordered   03/23/20 0000  Discharge wound care:       Comments: Follow with podiatry   03/23/20 1334          Follow-up Information    Schnier, Dolores Lory, MD Follow up in 1 month(s).   Specialties: Vascular Surgery, Cardiology, Radiology, Vascular Surgery Why: Can see Schnier or Arna Medici. Will need ABI with visit. Contact information: [place] [address] [phone]        [name], [name] Follow up in 1 week(s).   Specialty: Podiatry Contact information: [place] [address] [phone]        Iloabachie, Gackle, NP Follow up in 1 week(s).   Specialty: Gerontology Contact information: [address] [place]  [address] [phone]             34 minutes  Signed: Sharen Hones 03/23/2020, 1:35 PM

## 2020-03-23 NOTE — Progress Notes (Signed)
   Date of Admission:  03/15/2020      ID: Timothy [name]. is a 59 y.o. male  Active Problems:   Essential hypertension   Smoking   Type 2 diabetes mellitus with diabetic neuropathy, unspecified ([facility])   Osteomyelitis ([facility])    Subjective: Doing well No specific complaints Wants to go home   Medications:  . amLODipine  10 mg Oral Daily  . citalopram  20 mg Oral Daily  . cloNIDine  0.1 mg Oral QHS  . clopidogrel  75 mg Oral Q breakfast  . gabapentin  300 mg Oral BID  . insulin aspart  0-15 Units Subcutaneous TID WC  . insulin aspart  0-5 Units Subcutaneous QHS  . insulin glargine  5 Units Subcutaneous QHS  . living well with diabetes book   Does not apply Once  . magnesium oxide  400 mg Oral Daily  . mometasone-formoterol  2 puff Inhalation BID  . mupirocin ointment  1 application Nasal BID  . nicotine  21 mg Transdermal Daily  . pantoprazole  40 mg Oral Daily  . rosuvastatin  5 mg Oral Daily  . sodium chloride flush  3 mL Intravenous Q12H    Objective: Vital signs in last 24 hours: Temp:  [97.6 F (36.4 C)-98.5 F (36.9 C)] 97.7 F (36.5 C) (10/06 0757) Pulse Rate:  [57-69] 64 (10/06 1010) Resp:  [12-18] 16 (10/06 0757) BP: (93-161)/(61-91) 143/79 (10/06 1010) SpO2:  [92 %-100 %] 100 % (10/06 0757)  PHYSICAL EXAM:  General: Alert, cooperative, no distress, appears stated age.  Head: Normocephalic, without obvious abnormality, atraumatic. Eyes: Conjunctivae clear, anicteric sclerae. Pupils are equal ENT Nares normal. No drainage or sinus tenderness. Lips, mucosa, and tongue normal. No Thrush Neck: Supple, symmetrical, no adenopathy, thyroid: non tender no carotid bruit and no JVD. Back: No CVA tenderness. Lungs: Clear to auscultation bilaterally. No Wheezing or Rhonchi. No rales. Heart: Regular rate and rhythm, no murmur, rub or gallop. Abdomen: Soft, non-tender,not distended. Bowel sounds normal. No masses Extremities: left foot    Surgical site well  approximated Minimal erythema Some oozing  Skin: No rashes or lesions. Or bruising Lymph: Cervical, supraclavicular normal. Neurologic: Grossly non-focal  Lab Results Recent Labs    03/22/20 0344 03/23/20 0307  WBC 6.9 10.6*  HGB 12.2* 12.9*  HCT 34.8* 35.1*  NA 134* 133*  K 3.6 4.3  CL 98 98  CO2 28 24  BUN 13 20  CREATININE 0.75 0.87    Microbiology: GBS in culture         Studies/Results: No results found.   Assessment/Plan:   Left great toe infection- s/p partial amputation Ischemia and necrosis underwent angio on 03/18/20 and had PTA and stent placement of left superficial femoral artery and angioplasty of left peroneal and tibioperoneal trunk 0n 03/18/20  Pathology of proximal margin shows osteo- Pt has Group B strep in the wound and is currently on Unasyn to cover GBS, anerobes and gram neg. Pt underwent ray excision on 03/22/20 Will send him on PO augmentin for 10-14 days Wound looks much improved Pt seen with Dr.Cline- Discussed that PO antibiotic should be okay on discharge  Diabetes mellitus -poorly controlled- currently on insulin  HTn on amlodipine and clonidine  Smoker- spoke to him about quitting to help with wound healing  Hyperlipidemia on rosuvastatin hypertriglyceridemia  Discussed the management with the patient and Dr.Cline Follow up as OP

## 2020-03-24 LAB — AEROBIC/ANAEROBIC CULTURE W GRAM STAIN (SURGICAL/DEEP WOUND)

## 2020-03-24 LAB — SURGICAL PATHOLOGY

## 2020-03-31 ENCOUNTER — Other Ambulatory Visit: Payer: Self-pay | Admitting: Gerontology

## 2020-03-31 ENCOUNTER — Other Ambulatory Visit: Payer: Self-pay

## 2020-03-31 ENCOUNTER — Encounter: Payer: Self-pay | Admitting: Gerontology

## 2020-03-31 ENCOUNTER — Ambulatory Visit: Payer: Self-pay | Admitting: Gerontology

## 2020-03-31 VITALS — BP 120/75 | HR 88 | Temp 97.0°F | Resp 17 | Ht 70.0 in | Wt 188.5 lb

## 2020-03-31 DIAGNOSIS — E1140 Type 2 diabetes mellitus with diabetic neuropathy, unspecified: Secondary | ICD-10-CM

## 2020-03-31 DIAGNOSIS — Z8719 Personal history of other diseases of the digestive system: Secondary | ICD-10-CM

## 2020-03-31 DIAGNOSIS — R0602 Shortness of breath: Secondary | ICD-10-CM

## 2020-03-31 DIAGNOSIS — I10 Essential (primary) hypertension: Secondary | ICD-10-CM

## 2020-03-31 DIAGNOSIS — G6289 Other specified polyneuropathies: Secondary | ICD-10-CM

## 2020-03-31 MED ORDER — ESOMEPRAZOLE MAGNESIUM 20 MG PO CPDR: 20.0000 mg | DELAYED_RELEASE_CAPSULE | Freq: Every day | ORAL | 3 refills | Status: DC

## 2020-03-31 MED ORDER — GLIPIZIDE 5 MG PO TABS: 5.0000 mg | ORAL_TABLET | Freq: Two times a day (BID) | ORAL | 3 refills | Status: DC

## 2020-03-31 MED ORDER — METFORMIN HCL 1000 MG PO TABS: 1000.0000 mg | ORAL_TABLET | Freq: Two times a day (BID) | ORAL | 3 refills | Status: DC

## 2020-03-31 MED ORDER — AMLODIPINE BESYLATE 5 MG PO TABS: 5.0000 mg | ORAL_TABLET | Freq: Every day | ORAL | 3 refills | Status: DC

## 2020-03-31 MED ORDER — GABAPENTIN 300 MG PO CAPS: 300.0000 mg | ORAL_CAPSULE | Freq: Two times a day (BID) | ORAL | 2 refills | Status: DC

## 2020-03-31 NOTE — Progress Notes (Signed)
Social Work Intern talked to patient concerning his PHQ 9  ranking of 2 (more than half the days) for "Thoughts that you would be better off dead, or of hurting yourself".  Patient informed SW intern that he talks to enough people and does not want to talk to anyone else.  Patient shared that he recently lost his foot and this attributes to him having thoughts that he'd be better off dead.  He shared that he has no plans of taking his own life only thoughts.  He informed me that he sees a therapist and a psychiatrist at [facility].  He used to see a Paramedic and psychiatrist at [facility].  He noted 2 suicide attempts, one when he was a child and another about 2 years ago. While patient verbally stated that he did not want any additional services, social work Tax inspector provided him with [name]'s crisis phone number and [facility]'s mobile crisis number.

## 2020-03-31 NOTE — Progress Notes (Signed)
Established Patient Office Visit  Subjective:  Patient ID: Timothy Ballow., male    DOB: May 17, 1961  Age: 59 y.o. MRN: [id number]  CC:  Chief Complaint  Patient presents with  . Follow-up    HPI Timothy [name]. 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 He reports 7/10 pain at the site. States that he is taking oxycodone for pain control. States that he has a follow up on 04/05/20 with the surgeon for recheck and suture removal. Denies bleeding or discharge from site.  His A1c decreased from 9.3% on 01/13/20 to 8.5% on 03/15/20. He reports that he's compliant with his medications and continues to work on making healthy life style choices. He checks his blood glucose daily and states that his readings average in the 200s. States that his sugar this morning was 177. He denies hypoglycemic symptoms and states that his peripheral neuropathy is stable and is controlled with gabapentin daily.  He had low dose CT scan done on 11/03/2019 and it showed " one vessel coronary atherosclerosis. Aortic Atherosclerosis (ICD10-I70.0) and Emphysema (ICD10-J43.9)" per Dr Santiago Bumpers. He endorses continued shortness of breath with exertion. Denies shortness of breath at rest. He reports that he continues to smoke 1 pack of cigarettes daily and states that he is not ready to quit. He states that his GERD symptoms are stable on his current medications. Patient offers no further complaint.    Past Medical History:  Diagnosis Date  . Diabetes mellitus without complication ([place])   . Hypercholesteremia   . Hypertension     Past Surgical History:  Procedure Laterality Date  . AMPUTATION Left 03/22/2020   Procedure: AMPUTATION RAY-1st Ray Left Foot;  Surgeon: [name], [name];  Location: [facility] ORS;  Service: Podiatry;  Laterality: Left;  . AMPUTATION TOE Left 03/16/2020   Procedure: AMPUTATION GREAT TOE;  Surgeon: [name], [name];  Location: [facility] ORS;  Service: Podiatry;   Laterality: Left;  . COLONOSCOPY WITH PROPOFOL N/A 12/22/2019   Procedure: COLONOSCOPY WITH PROPOFOL;  Surgeon: [name], MD;  Location: [facility] ENDOSCOPY;  Service: Endoscopy;  Laterality: N/A;  . LOWER EXTREMITY ANGIOGRAPHY Left 03/18/2020   Procedure: Lower Extremity Angiography;  Surgeon: [name], MD;  Location: [place] [facility];  Service: Cardiovascular;  Laterality: Left;  . TONSILLECTOMY    . WISDOM TOOTH EXTRACTION      History reviewed. No pertinent family history.  Social History   Socioeconomic History  . Marital status: Single    Spouse name: Not on file  . Number of children: 0  . Years of education: Not on file  . Highest education level: Not on file  Occupational History  . Occupation: Unemployed  Tobacco Use  . Smoking status: Current Every Day Smoker    Packs/day: 1.50    Years: 41.00    Pack years: 61.50    Types: Cigarettes  . Smokeless tobacco: Never Used  Vaping Use  . Vaping Use: Never used  Substance and Sexual Activity  . Alcohol use: Yes    Alcohol/week: 6.0 standard drinks    Types: 6 Cans of beer per week    Comment: occasionally   . Drug use: Not Currently  . Sexual activity: Not Currently  Other Topics Concern  . Not on file  Social History Narrative  . Not on file   Social Determinants of Health   Financial Resource Strain: Medium Risk  . Difficulty of Paying Living Expenses: Somewhat  hard  Food Insecurity: No Food Insecurity  . Worried About Charity fundraiser in the Last Year: Never true  . Ran Out of Food in the Last Year: Never true  Transportation Needs: No Transportation Needs  . Lack of Transportation (Medical): No  . Lack of Transportation (Non-Medical): No  Physical Activity: Inactive  . Days of Exercise per Week: 0 days  . Minutes of Exercise per Session: 0 min  Stress: Stress Concern Present  . Feeling of Stress : Rather much  Social Connections: Unknown  . Frequency of Communication with Friends and  Family: Three times a week  . Frequency of Social Gatherings with Friends and Family: Never  . Attends Religious Services: Never  . Active Member of Clubs or Organizations: No  . Attends Archivist Meetings: Never  . Marital Status: Not on file  Intimate Partner Violence: Not At Risk  . Fear of Current or Ex-Partner: No  . Emotionally Abused: No  . Physically Abused: No  . Sexually Abused: No    Outpatient Medications Prior to Visit  Medication Sig Dispense Refill  . albuterol (VENTOLIN HFA) 108 (90 Base) MCG/ACT inhaler Inhale 1 puff into the lungs every 6 (six) hours as needed for wheezing or shortness of breath. 18 g 2  . amoxicillin-clavulanate (AUGMENTIN) 875-125 MG tablet Take 1 tablet by mouth 2 (two) times daily for 14 days. 28 tablet 0  . blood glucose meter kit and supplies KIT Dispense based on patient and insurance preference. Use up to four times daily as directed. (FOR ICD-9 250.00, 250.01). 1 each 0  . calcium carbonate (TUMS - DOSED IN MG ELEMENTAL CALCIUM) 500 MG chewable tablet Chew by mouth.    . citalopram (CELEXA) 20 MG tablet Take 1 tablet (20 mg total) by mouth daily. 56 tablet 0  . cloNIDine (CATAPRES) 0.1 MG tablet Take 1 tablet (0.1 mg total) by mouth at bedtime. 56 tablet 0  . clopidogrel (PLAVIX) 75 MG tablet Take 1 tablet (75 mg total) by mouth daily with breakfast. 30 tablet 0  . Fluticasone-Salmeterol (ADVAIR) 250-50 MCG/DOSE AEPB Inhale 1 puff into the lungs 2 (two) times daily. 60 each 1  . Magnesium 400 MG CAPS Take 400 mg by mouth daily.    Marland Kitchen oxyCODONE 10 MG TABS Take 1 tablet (10 mg total) by mouth every 4 (four) hours as needed for moderate pain. 14 tablet 0  . rosuvastatin (CRESTOR) 5 MG tablet Take 1 tablet (5 mg total) by mouth daily. 30 tablet 3  . amLODipine (NORVASC) 5 MG tablet TAKE ONE TABLET BY MOUTH EVERY DAY 30 tablet 0  . esomeprazole (NEXIUM) 20 MG capsule Take 1 capsule (20 mg total) by mouth daily at 12 noon. (Patient taking  differently: Take 20 mg by mouth daily. ) 30 capsule 3  . gabapentin (NEURONTIN) 300 MG capsule Take 1 capsule (300 mg total) by mouth 2 (two) times daily. 60 capsule 2  . glipiZIDE (GLUCOTROL) 5 MG tablet Take 0.5 tablets (2.5 mg total) by mouth 2 (two) times daily before a meal. 60 tablet 1  . metFORMIN (GLUCOPHAGE) 1000 MG tablet Take 1 tablet (1,000 mg total) by mouth 2 (two) times daily with a meal. 60 tablet 2  . Acetaminophen (ARTHRITIS PAIN PO) Take 1,000 mg by mouth daily.     No facility-administered medications prior to visit.    Allergies  Allergen Reactions  . Bee Venom Anaphylaxis  . Coffee Flavor Shortness Of Breath  . Cheese Swelling  Blue Cheese only    ROS Review of Systems  Constitutional: Negative.   Respiratory: Positive for shortness of breath.   Cardiovascular: Negative.   Gastrointestinal: Negative.   Endocrine: Negative.   Skin: Negative.   Neurological: Positive for dizziness.      Objective:    Physical Exam Constitutional:      Appearance: Normal appearance.  Cardiovascular:     Rate and Rhythm: Normal rate and regular rhythm.     Heart sounds: Normal heart sounds.  Pulmonary:     Effort: Pulmonary effort is normal.     Breath sounds: Normal breath sounds.  Abdominal:     General: Abdomen is flat. Bowel sounds are normal.     Palpations: Abdomen is soft.  Skin:    General: Skin is warm and dry.  Neurological:     General: No focal deficit present.     Mental Status: He is alert and oriented to person, place, and time.  Psychiatric:        Behavior: Behavior normal.        Thought Content: Thought content normal.        Judgment: Judgment normal.     BP 120/75 (BP Location: Left Arm, Patient Position: Sitting, Cuff Size: Normal)   Pulse 88   Temp (!) 97 F (36.1 C) (Temporal)   Resp 17   Ht 5' 10" (1.778 m)   Wt 188 lb 8 oz (85.5 kg)   SpO2 97%   BMI 27.05 kg/m  Wt Readings from Last 3 Encounters:  03/31/20 188 lb 8 oz  (85.5 kg)  03/18/20 186 lb 15.2 oz (84.8 kg)  02/17/20 194 lb (88 kg)     Health Maintenance Due  Topic Date Due  . OPHTHALMOLOGY EXAM  Never done  . COVID-19 Vaccine (1) Never done  . TETANUS/TDAP  Never done  . INFLUENZA VACCINE  Never done    There are no preventive care reminders to display for this patient.  Lab Results  Component Value Date   TSH 1.720 10/28/2019   Lab Results  Component Value Date   WBC 10.6 (H) 03/23/2020   HGB 12.9 (L) 03/23/2020   HCT 35.1 (L) 03/23/2020   MCV 92.4 03/23/2020   PLT 478 (H) 03/23/2020   Lab Results  Component Value Date   NA 133 (L) 03/23/2020   K 4.3 03/23/2020   CO2 24 03/23/2020   GLUCOSE 258 (H) 03/23/2020   BUN 20 03/23/2020   CREATININE 0.87 03/23/2020   BILITOT 0.7 03/16/2020   ALKPHOS 92 03/16/2020   AST 17 03/16/2020   ALT 18 03/16/2020   PROT 7.4 03/16/2020   ALBUMIN 3.4 (L) 03/16/2020   CALCIUM 9.1 03/23/2020   ANIONGAP 11 03/23/2020   Lab Results  Component Value Date   CHOL 267 (H) 10/28/2019   Lab Results  Component Value Date   HDL 47 10/28/2019   Lab Results  Component Value Date   LDLCALC 129 (H) 10/28/2019   Lab Results  Component Value Date   TRIG 501 (H) 10/28/2019   Lab Results  Component Value Date   CHOLHDL 5.7 (H) 10/28/2019   Lab Results  Component Value Date   HGBA1C 8.5 (H) 03/15/2020      Assessment & Plan:   1. Essential hypertension Blood pressure under control, he will continue on current medication regimen, was advised to continue on DASH diet and exercise as tolerated. - amLODipine (NORVASC) 5 MG tablet; Take 1 tablet (5 mg total)  by mouth daily.  Dispense: 30 tablet; Refill: 3  2. History of gastroesophageal reflux (GERD) GERD symptoms under control. Continue current treatment plan.  -Avoid spicy, fatty and fried food -Avoid sodas and sour juices -Avoid heavy meals -Avoid eating 4 hours before bedtime -Elevate head of bed at night - esomeprazole (NEXIUM) 20  MG capsule; Take 1 capsule (20 mg total) by mouth daily at 12 noon.  Dispense: 30 capsule; Refill: 3  3. Type 2 diabetes mellitus with diabetic neuropathy, without long-term current use of insulin (HCC) His HgbA1c was 8.5% and his goal is less than 7%, his glipizide was increased to 33m BID. He was advised to check blood glucose bid, record and bring log to visit. His fasting reading goal should be between 80-130 mg/dl. He was advised to continue on low carb/ non concentrated sweet diet. - gabapentin (NEURONTIN) 300 MG capsule; Take 1 capsule (300 mg total) by mouth 2 (two) times daily.  Dispense: 60 capsule; Refill: 2 - glipiZIDE (GLUCOTROL) 5 MG tablet; Take 1 tablet (5 mg total) by mouth 2 (two) times daily before a meal.  Dispense: 60 tablet; Refill: 3 - metFORMIN (GLUCOPHAGE) 1000 MG tablet; Take 1 tablet (1,000 mg total) by mouth 2 (two) times daily with a meal.  Dispense: 60 tablet; Refill: 3 - Lipid Profile; Future - CBC w/Diff; Future - Comp Met (CMET); Future - HgB A1c; Future  4. Shortness of breath Continue current medication, was strongly encouraged to stop smoking. - Ambulatory referral to Pulmonology   Meds ordered this encounter  Medications  . amLODipine (NORVASC) 5 MG tablet    Sig: Take 1 tablet (5 mg total) by mouth daily.    Dispense:  30 tablet    Refill:  3  . esomeprazole (NEXIUM) 20 MG capsule    Sig: Take 1 capsule (20 mg total) by mouth daily at 12 noon.    Dispense:  30 capsule    Refill:  3  . gabapentin (NEURONTIN) 300 MG capsule    Sig: Take 1 capsule (300 mg total) by mouth 2 (two) times daily.    Dispense:  60 capsule    Refill:  2  . glipiZIDE (GLUCOTROL) 5 MG tablet    Sig: Take 1 tablet (5 mg total) by mouth 2 (two) times daily before a meal.    Dispense:  60 tablet    Refill:  3  . metFORMIN (GLUCOPHAGE) 1000 MG tablet    Sig: Take 1 tablet (1,000 mg total) by mouth 2 (two) times daily with a meal.    Dispense:  60 tablet    Refill:  3     Follow-up: Return in about 3 months (around 06/29/2020), or if symptoms worsen or fail to improve.    JMarina Gravel Student-NP

## 2020-04-05 ENCOUNTER — Ambulatory Visit: Payer: Self-pay | Admitting: Infectious Diseases

## 2020-04-07 ENCOUNTER — Telehealth: Payer: Self-pay | Admitting: Pharmacist

## 2020-04-07 NOTE — Telephone Encounter (Signed)
04/07/2020 2:17:16 PM - Call to [employer] on June fax for Advair/Ventolin  -- Rhetta Mura - Thursday, April 07, 2020 2:13 PM --Jeanene Erb [employer] spoke with Chasity on the enrollment application for Ventolin & Advair faxed 12/10/19. According to her they received the application with a script for Ventolin and was denied due to No New Patients for this med after 12/01/2019, There was No Script for Advair. They will need new application due to signature over 90 days and will need a Advair script. I have printed [employer] application & Advair script-mailing patient his portion to sign & return also sending provider portion to [facility].  Entered ProAir to replace Ventolin-Printed [employer] application-mailing patient his portion to sign & return, also provider portion to [facility].

## 2020-04-13 ENCOUNTER — Other Ambulatory Visit: Payer: Self-pay

## 2020-04-18 ENCOUNTER — Telehealth: Payer: Self-pay | Admitting: Pharmacist

## 2020-04-18 NOTE — Telephone Encounter (Signed)
04/18/2020 9:47:54 AM - ProAir HFA & Advair pending  -- Rhetta Mura - Monday, April 18, 2020 9:46 AM --I have received the provider signed portion of ProAir & Advair back--holding for patient to return his portion-mailed to patient 04/07/2020.

## 2020-04-20 ENCOUNTER — Ambulatory Visit: Payer: Self-pay | Admitting: Gerontology

## 2020-04-20 ENCOUNTER — Other Ambulatory Visit (INDEPENDENT_AMBULATORY_CARE_PROVIDER_SITE_OTHER): Payer: Self-pay | Admitting: Vascular Surgery

## 2020-04-20 ENCOUNTER — Other Ambulatory Visit: Payer: Self-pay | Admitting: Gerontology

## 2020-04-20 DIAGNOSIS — Z95820 Peripheral vascular angioplasty status with implants and grafts: Secondary | ICD-10-CM

## 2020-04-20 DIAGNOSIS — Z8679 Personal history of other diseases of the circulatory system: Secondary | ICD-10-CM

## 2020-04-20 DIAGNOSIS — R0602 Shortness of breath: Secondary | ICD-10-CM

## 2020-04-20 DIAGNOSIS — F4310 Post-traumatic stress disorder, unspecified: Secondary | ICD-10-CM

## 2020-04-20 MED ORDER — CLONIDINE HCL 0.1 MG PO TABS: 0.1000 mg | ORAL_TABLET | Freq: Every day | ORAL | 0 refills | Status: DC

## 2020-04-20 MED ORDER — FLUTICASONE-SALMETEROL 250-50 MCG/DOSE IN AEPB: 1.0000 | INHALATION_SPRAY | Freq: Two times a day (BID) | RESPIRATORY_TRACT | 1 refills | Status: DC

## 2020-04-20 MED ORDER — ALBUTEROL SULFATE HFA 108 (90 BASE) MCG/ACT IN AERS: 1.0000 | INHALATION_SPRAY | Freq: Four times a day (QID) | RESPIRATORY_TRACT | 2 refills | Status: DC | PRN

## 2020-04-20 MED ORDER — CLOPIDOGREL BISULFATE 75 MG PO TABS: 75.0000 mg | ORAL_TABLET | Freq: Every day | ORAL | 2 refills | Status: DC

## 2020-04-22 ENCOUNTER — Telehealth: Payer: Self-pay | Admitting: Pharmacist

## 2020-04-22 ENCOUNTER — Other Ambulatory Visit: Payer: Self-pay

## 2020-04-22 ENCOUNTER — Ambulatory Visit (INDEPENDENT_AMBULATORY_CARE_PROVIDER_SITE_OTHER): Payer: Self-pay | Admitting: Nurse Practitioner

## 2020-04-22 ENCOUNTER — Encounter (INDEPENDENT_AMBULATORY_CARE_PROVIDER_SITE_OTHER): Payer: Self-pay | Admitting: Nurse Practitioner

## 2020-04-22 ENCOUNTER — Ambulatory Visit (INDEPENDENT_AMBULATORY_CARE_PROVIDER_SITE_OTHER): Payer: Self-pay

## 2020-04-22 VITALS — BP 132/81 | HR 90 | Resp 16 | Wt 196.0 lb

## 2020-04-22 DIAGNOSIS — I10 Essential (primary) hypertension: Secondary | ICD-10-CM

## 2020-04-22 DIAGNOSIS — E1140 Type 2 diabetes mellitus with diabetic neuropathy, unspecified: Secondary | ICD-10-CM

## 2020-04-22 DIAGNOSIS — I739 Peripheral vascular disease, unspecified: Secondary | ICD-10-CM

## 2020-04-22 DIAGNOSIS — F17200 Nicotine dependence, unspecified, uncomplicated: Secondary | ICD-10-CM

## 2020-04-22 DIAGNOSIS — Z95820 Peripheral vascular angioplasty status with implants and grafts: Secondary | ICD-10-CM

## 2020-04-22 NOTE — Telephone Encounter (Signed)
04/22/2020 1:27:08 PM - [employer] form faxed for Advair  -- Rhetta Mura - Friday, April 22, 2020 1:26 PM --Faxed [employer] application for enrollment on Advair 250/50 Inhale 1 puff into the lungs two times a day # 3. 04/22/2020 1:26:06 PM - ProAir HFA to [employer]  -- Rhetta Mura - Friday, April 22, 2020 1:23 PM --Faxed [employer] application for ProAir HFA Inhale 1 puff into the lungs every 6 hours as needed for wheezing or shortness of breath #4 (replaces Ventolin HFA)

## 2020-04-23 ENCOUNTER — Encounter (INDEPENDENT_AMBULATORY_CARE_PROVIDER_SITE_OTHER): Payer: Self-pay | Admitting: Nurse Practitioner

## 2020-04-23 NOTE — Progress Notes (Signed)
Subjective:    Patient ID: Timothy Main., male    DOB: 1961-03-23, 59 y.o.   MRN: [id number] Chief Complaint  Patient presents with  . Follow-up    [facility] 88monthpost le angio    The patient returns to the office for followup and review status post angiogram with intervention. The patient notes improvement in the lower extremity symptoms. No interval shortening of the patient's claudication distance or rest pain symptoms. Previous wounds have now healed.  No new ulcers or wounds have occurred since the last visit.  The patient has an amputation of the left great toe which appears to be essentially fully healed.  There have been no significant changes to the patient's overall health care.  The patient denies amaurosis fugax or recent TIA symptoms. There are no recent neurological changes noted. The patient denies history of DVT, PE or superficial thrombophlebitis. The patient denies recent episodes of angina or shortness of breath.   ABI's Rt=1.12 and Lt=0.99  (No previous ABI's) Duplex [place]of the right lower extremity shows triphasic/biphasic waveforms with good toe waveforms.  The left lower extremity has monophasic/biphasic waveforms with slightly dampened second toe waveforms.   Review of Systems  Cardiovascular:       Foot swelling  Musculoskeletal: Positive for gait problem.  Skin: Positive for wound.  All other systems reviewed and are negative.      Objective:   Physical Exam Vitals reviewed.  HENT:     Head: Normocephalic.  Cardiovascular:     Rate and Rhythm: Normal rate.     Pulses: Decreased pulses.  Pulmonary:     Effort: Pulmonary effort is normal.  Skin:    General: Skin is warm and dry.  Neurological:     Mental Status: He is alert and oriented to person, place, and time.  Psychiatric:        Mood and Affect: Mood normal.        Behavior: Behavior normal.        Thought Content: Thought content normal.        Judgment: Judgment normal.     BP  132/81 (BP Location: Right Arm)   Pulse 90   Resp 16   Wt 196 lb (88.9 kg)   BMI 28.12 kg/m   Past Medical History:  Diagnosis Date  . Diabetes mellitus without complication ([place]   . Hypercholesteremia   . Hypertension     Social History   Socioeconomic History  . Marital status: Single    Spouse name: Not on file  . Number of children: 0  . Years of education: Not on file  . Highest education level: Not on file  Occupational History  . Occupation: Unemployed  Tobacco Use  . Smoking status: Current Every Day Smoker    Packs/day: 1.50    Years: 41.00    Pack years: 61.50    Types: Cigarettes  . Smokeless tobacco: Never Used  Vaping Use  . Vaping Use: Never used  Substance and Sexual Activity  . Alcohol use: Yes    Alcohol/week: 6.0 standard drinks    Types: 6 Cans of beer per week    Comment: occasionally   . Drug use: Not Currently  . Sexual activity: Not Currently  Other Topics Concern  . Not on file  Social History Narrative  . Not on file   Social Determinants of Health   Financial Resource Strain: Medium Risk  . Difficulty of Paying Living Expenses: Somewhat hard  Food Insecurity: No Food Insecurity  . Worried About Charity fundraiser in the Last Year: Never true  . Ran Out of Food in the Last Year: Never true  Transportation Needs: No Transportation Needs  . Lack of Transportation (Medical): No  . Lack of Transportation (Non-Medical): No  Physical Activity: Inactive  . Days of Exercise per Week: 0 days  . Minutes of Exercise per Session: 0 min  Stress: Stress Concern Present  . Feeling of Stress : Rather much  Social Connections: Unknown  . Frequency of Communication with Friends and Family: Three times a week  . Frequency of Social Gatherings with Friends and Family: Never  . Attends Religious Services: Never  . Active Member of Clubs or Organizations: No  . Attends Archivist Meetings: Never  . Marital Status: Not on file  Intimate  Partner Violence: Not At Risk  . Fear of Current or Ex-Partner: No  . Emotionally Abused: No  . Physically Abused: No  . Sexually Abused: No    Past Surgical History:  Procedure Laterality Date  . AMPUTATION Left 03/22/2020   Procedure: AMPUTATION RAY-1st Ray Left Foot;  Surgeon: [name], [name];  Location: [facility] ORS;  Service: Podiatry;  Laterality: Left;  . AMPUTATION TOE Left 03/16/2020   Procedure: AMPUTATION GREAT TOE;  Surgeon: [name], [name];  Location: [facility] ORS;  Service: Podiatry;  Laterality: Left;  . COLONOSCOPY WITH PROPOFOL N/A 12/22/2019   Procedure: COLONOSCOPY WITH PROPOFOL;  Surgeon: [name], MD;  Location: [facility] ENDOSCOPY;  Service: Endoscopy;  Laterality: N/A;  . LOWER EXTREMITY ANGIOGRAPHY Left 03/18/2020   Procedure: Lower Extremity Angiography;  Surgeon: [name], MD;  Location: [place] [facility];  Service: Cardiovascular;  Laterality: Left;  . TONSILLECTOMY    . WISDOM TOOTH EXTRACTION      History reviewed. No pertinent family history.  Allergies  Allergen Reactions  . Bee Venom Anaphylaxis  . Coffee Flavor Shortness Of Breath  . Cheese Swelling    Blue Cheese only    CBC Latest Ref Rng & Units 03/23/2020 03/22/2020 03/19/2020  WBC 4.0 - 10.5 K/uL 10.6(H) 6.9 8.1  Hemoglobin 13.0 - 17.0 g/dL 12.9(L) 12.2(L) 11.8(L)  Hematocrit 39 - 52 % 35.1(L) 34.8(L) 33.0(L)  Platelets 150 - 400 K/uL 478(H) 464(H) 335      CMP     Component Value Date/Time   NA 133 (L) 03/23/2020 0307   NA 130 (L) 10/28/2019 1157   K 4.3 03/23/2020 0307   CL 98 03/23/2020 0307   CO2 24 03/23/2020 0307   GLUCOSE 258 (H) 03/23/2020 0307   BUN 20 03/23/2020 0307   BUN 13 10/28/2019 1157   CREATININE 0.87 03/23/2020 0307   CALCIUM 9.1 03/23/2020 0307   PROT 7.4 03/16/2020 0555   PROT 7.8 10/28/2019 1157   ALBUMIN 3.4 (L) 03/16/2020 0555   ALBUMIN 5.0 (H) 10/28/2019 1157   AST 17 03/16/2020 0555   ALT 18 03/16/2020 0555   ALKPHOS 92 03/16/2020 0555   BILITOT  0.7 03/16/2020 0555   BILITOT 0.5 10/28/2019 1157   GFRNONAA >60 03/23/2020 0307   GFRAA >60 03/22/2020 0344        Assessment & Plan:   1. Peripheral artery disease (HCC)  Recommend:  The patient has evidence of atherosclerosis of the lower extremities with claudication.  The patient does not voice lifestyle limiting changes at this point in time.  Noninvasive studies do not suggest clinically significant change.  No invasive studies, angiography  or surgery at this time The patient should continue walking and begin a more formal exercise program.  The patient should continue antiplatelet therapy and aggressive treatment of the lipid abnormalities  No changes in the patient's medications at this time  The patient should continue wearing graduated compression socks 10-15 mmHg strength to control the mild edema.    2. Type 2 diabetes mellitus with diabetic neuropathy, without long-term current use of insulin ([place]) Continue hypoglycemic medications as already ordered, these medications have been reviewed and there are no changes at this time.  Hgb A1C to be monitored as already arranged by primary service   3. Essential hypertension Continue antihypertensive medications as already ordered, these medications have been reviewed and there are no changes at this time.   4. Smoking Smoking cessation was discussed, 3-10 minutes spent on this topic specifically    Current Outpatient Medications on File Prior to Visit  Medication Sig Dispense Refill  . Acetaminophen (ARTHRITIS PAIN PO) Take 1,000 mg by mouth daily.    Marland Kitchen albuterol (VENTOLIN HFA) 108 (90 Base) MCG/ACT inhaler Inhale 1 puff into the lungs every 6 (six) hours as needed for wheezing or shortness of breath. 18 g 2  . amLODipine (NORVASC) 5 MG tablet Take 1 tablet (5 mg total) by mouth daily. 30 tablet 3  . blood glucose meter kit and supplies KIT Dispense based on patient and insurance preference. Use up to four times  daily as directed. (FOR ICD-9 250.00, 250.01). 1 each 0  . calcium carbonate (TUMS - DOSED IN MG ELEMENTAL CALCIUM) 500 MG chewable tablet Chew by mouth.    . citalopram (CELEXA) 20 MG tablet Take 1 tablet (20 mg total) by mouth daily. 56 tablet 0  . cloNIDine (CATAPRES) 0.1 MG tablet Take 1 tablet (0.1 mg total) by mouth at bedtime. 56 tablet 0  . clopidogrel (PLAVIX) 75 MG tablet Take 1 tablet (75 mg total) by mouth daily with breakfast. 30 tablet 2  . esomeprazole (NEXIUM) 20 MG capsule Take 1 capsule (20 mg total) by mouth daily at 12 noon. 30 capsule 3  . Fluticasone-Salmeterol (ADVAIR) 250-50 MCG/DOSE AEPB Inhale 1 puff into the lungs 2 (two) times daily. 60 each 1  . gabapentin (NEURONTIN) 300 MG capsule Take 1 capsule (300 mg total) by mouth 2 (two) times daily. 60 capsule 2  . glipiZIDE (GLUCOTROL) 5 MG tablet Take 1 tablet (5 mg total) by mouth 2 (two) times daily before a meal. 60 tablet 3  . Magnesium 400 MG CAPS Take 400 mg by mouth daily.    . metFORMIN (GLUCOPHAGE) 1000 MG tablet Take 1 tablet (1,000 mg total) by mouth 2 (two) times daily with a meal. 60 tablet 3  . rosuvastatin (CRESTOR) 5 MG tablet Take 1 tablet (5 mg total) by mouth daily. 30 tablet 3  . oxyCODONE 10 MG TABS Take 1 tablet (10 mg total) by mouth every 4 (four) hours as needed for moderate pain. (Patient not taking: Reported on 04/22/2020) 14 tablet 0   No current facility-administered medications on file prior to visit.    There are no Patient Instructions on file for this visit. No follow-ups on file.   Kris Hartmann, NP

## 2020-04-25 ENCOUNTER — Ambulatory Visit (INDEPENDENT_AMBULATORY_CARE_PROVIDER_SITE_OTHER): Payer: No Payment, Other | Admitting: Licensed Clinical Social Worker

## 2020-04-25 ENCOUNTER — Other Ambulatory Visit: Payer: Self-pay

## 2020-04-25 DIAGNOSIS — F3162 Bipolar disorder, current episode mixed, moderate: Secondary | ICD-10-CM | POA: Diagnosis not present

## 2020-04-25 DIAGNOSIS — F411 Generalized anxiety disorder: Secondary | ICD-10-CM

## 2020-04-25 NOTE — Progress Notes (Signed)
   THERAPIST PROGRESS NOTE  Session Time: 56  Therapist Response:    Subjective/Objective: Pt was alert and oriented x 5. He was dressed casually and engaged well with f/u therapy session. Pt presented today with irritable mood and anxious affect. Timothy [name] maintained good eye contact and was cooperative.   Pt presents today with primary stressors as illness and housing problems. He reports that he has not showed for both his medication management appointment and therapy due to getting a right toe amputation. Timothy [name] reports that he has been struggling with his diabetes and had a bone infection in his big toe. The doctors did attempt to do a partial amputation, but the infection was too severe. He Is currently using a can to ambulate. Due to the 1-week hospital stay pt now reports he has an over 100 thousand in due medical bills LCSW used solution focused therapy as primary intervention for this. LCSW and pt spoke about [place] financials assistance and Medicaid. Timothy [name] states he has been in contact with the bill department that has started helping him with this process.   Timothy [name] 2nd stressor mention above in housing. Currently pt lives with 2 roommates, which he reports are very negative people. They are unable to do housekeeping activities which has cause more stress and tension on pt due to the increase in responsibility. LCSW utilized CBT therapy intervention to have pt write down three positive things about himself, which pt stated he had relief from exercise.    Assessment/Plan: Pt endorse symptoms for depression, anxiety and hypomanic as irritability, rapid thoughts, insomnia, mood swings, sadness, worthlessness, and hopelessness. Pt is not currently taking any medications but is willing to make an appointment for medication evaluation. Plan moving forward to write down 1 positive statement of affirmation about himself, journal what and who makes him irritable and make me  Participation Level:  Active  Behavioral Response: Casual and Fairly GroomedAlertAnxious and Irritable  Type of Therapy: Individual Therapy  Treatment Goals addressed: Diagnosis: Bipolar disorder currently depressed  Interventions: CBT and Supportive  Summary: Timothy Fitz. is a 59 y.o. male who presents with bipolar disorder depressive episode.   Suicidal/Homicidal: Nowithout intent/plan  Plan: Return again in 8 weeks.     Timothy Cooks, LCSW 04/25/2020

## 2020-05-04 ENCOUNTER — Other Ambulatory Visit: Payer: Self-pay | Admitting: Gerontology

## 2020-05-04 DIAGNOSIS — R0602 Shortness of breath: Secondary | ICD-10-CM

## 2020-05-06 ENCOUNTER — Telehealth (HOSPITAL_COMMUNITY): Payer: No Payment, Other | Admitting: Psychiatry

## 2020-05-25 ENCOUNTER — Other Ambulatory Visit: Payer: Self-pay | Admitting: Adult Health

## 2020-05-25 ENCOUNTER — Other Ambulatory Visit: Payer: Self-pay

## 2020-05-25 ENCOUNTER — Ambulatory Visit: Payer: Self-pay | Admitting: Adult Health

## 2020-05-25 ENCOUNTER — Encounter: Payer: Self-pay | Admitting: Adult Health

## 2020-05-25 VITALS — BP 151/93 | HR 92 | Temp 98.1°F | Resp 16

## 2020-05-25 DIAGNOSIS — E1159 Type 2 diabetes mellitus with other circulatory complications: Secondary | ICD-10-CM

## 2020-05-25 DIAGNOSIS — E1140 Type 2 diabetes mellitus with diabetic neuropathy, unspecified: Secondary | ICD-10-CM

## 2020-05-25 DIAGNOSIS — R21 Rash and other nonspecific skin eruption: Secondary | ICD-10-CM

## 2020-05-25 DIAGNOSIS — E1169 Type 2 diabetes mellitus with other specified complication: Secondary | ICD-10-CM

## 2020-05-25 DIAGNOSIS — E785 Hyperlipidemia, unspecified: Secondary | ICD-10-CM

## 2020-05-25 DIAGNOSIS — Z8679 Personal history of other diseases of the circulatory system: Secondary | ICD-10-CM

## 2020-05-25 MED ORDER — TRIAMCINOLONE ACETONIDE 0.025 % EX OINT: 1.0000 "application " | TOPICAL_OINTMENT | Freq: Two times a day (BID) | CUTANEOUS | 0 refills | Status: DC

## 2020-05-25 MED ORDER — TRIAMCINOLONE ACETONIDE 0.025 % EX OINT: 1.0000 "application " | TOPICAL_OINTMENT | Freq: Two times a day (BID) | CUTANEOUS | 2 refills | Status: DC

## 2020-05-25 MED ORDER — AMLODIPINE BESYLATE 10 MG PO TABS: 10.0000 mg | ORAL_TABLET | Freq: Every day | ORAL | 2 refills | Status: DC

## 2020-05-25 NOTE — Progress Notes (Signed)
Patient: Timothy [name]. Male    DOB: 02-Jan-1961   59 y.o.   MRN: [id number] Visit Date: 05/25/2020  Today's Provider: Deforest Hoyles, NP   No chief complaint on file.  Subjective:    HPI  Mr Rains presents for evaluation of a rash that started after he was put on Plavix after his left great toe amputation. Rash does not itch; mostly associated with bruising which was attributed to being on Plavix.  He states that after the bruising, he developed small nodules under his skin.  Nodules are not painful.  He is complaining of an erythematous rash that is localized just around his buttocks.  He denies any bedbugs in his house.  He is not allergic to anything that he is aware of.  Rash does not itch or hurt.  He denies trying any remedies.  However he did say that he started using a new body wash.  Regarding his left foot amputation, the area has healed well.  However he stopped using the surgical shoe that he was given and ended up fracturing his fourth and third toes.  He is being followed up by Ortho.  He has not been checking his blood sugars or his blood pressures.  He reports taking all his medications as prescribed.  No other complaints offered.  Allergies  Allergen Reactions  . Bee Venom Anaphylaxis  . Coffee Flavor Shortness Of Breath  . Cheese Swelling    Blue Cheese only   Previous Medications   ACETAMINOPHEN (ARTHRITIS PAIN PO)    Take 1,000 mg by mouth daily.   ADVAIR DISKUS 250-50 MCG/DOSE AEPB    INHALE 1 PUFF INTO THE LUNGS 2 TIMES A DAY   ALBUTEROL (VENTOLIN HFA) 108 (90 BASE) MCG/ACT INHALER    Inhale 1 puff into the lungs every 6 (six) hours as needed for wheezing or shortness of breath.   AMLODIPINE (NORVASC) 5 MG TABLET    Take 1 tablet (5 mg total) by mouth daily.   BLOOD GLUCOSE METER KIT AND SUPPLIES KIT    Dispense based on patient and insurance preference. Use up to four times daily as directed. (FOR ICD-9 250.00, 250.01).   CALCIUM CARBONATE (TUMS -  DOSED IN MG ELEMENTAL CALCIUM) 500 MG CHEWABLE TABLET    Chew by mouth.   CITALOPRAM (CELEXA) 20 MG TABLET    Take 1 tablet (20 mg total) by mouth daily.   CLONIDINE (CATAPRES) 0.1 MG TABLET    Take 1 tablet (0.1 mg total) by mouth at bedtime.   CLOPIDOGREL (PLAVIX) 75 MG TABLET    Take 1 tablet (75 mg total) by mouth daily with breakfast.   ESOMEPRAZOLE (NEXIUM) 20 MG CAPSULE    Take 1 capsule (20 mg total) by mouth daily at 12 noon.   GABAPENTIN (NEURONTIN) 300 MG CAPSULE    Take 1 capsule (300 mg total) by mouth 2 (two) times daily.   GLIPIZIDE (GLUCOTROL) 5 MG TABLET    Take 1 tablet (5 mg total) by mouth 2 (two) times daily before a meal.   MAGNESIUM 400 MG CAPS    Take 400 mg by mouth daily.   METFORMIN (GLUCOPHAGE) 1000 MG TABLET    Take 1 tablet (1,000 mg total) by mouth 2 (two) times daily with a meal.   OXYCODONE 10 MG TABS    Take 1 tablet (10 mg total) by mouth every 4 (four) hours as needed for moderate pain.   ROSUVASTATIN (CRESTOR) 5 MG TABLET  Take 1 tablet (5 mg total) by mouth daily.    Review of Systems  Constitutional: Negative.   Eyes: Negative.   Respiratory: Negative.   Cardiovascular: Negative.   Gastrointestinal: Negative.   Endocrine: Negative.   Musculoskeletal: Positive for gait problem (Secondary to left great toe amputation).  Skin: Positive for rash (Erythematous patchy rash on both buttocks).  Neurological: Negative for numbness.    Social History   Tobacco Use  . Smoking status: Current Every Day Smoker    Packs/day: 1.50    Years: 41.00    Pack years: 61.50    Types: Cigarettes  . Smokeless tobacco: Never Used  Substance Use Topics  . Alcohol use: Yes    Alcohol/week: 6.0 standard drinks    Types: 6 Cans of beer per week    Comment: occasionally    Objective:   There were no vitals taken for this visit.  Physical Exam Vitals and nursing note reviewed.  Constitutional:      Appearance: Normal appearance.  HENT:     Head: Normocephalic  and atraumatic.     Mouth/Throat:     Mouth: Mucous membranes are dry.  Eyes:     Extraocular Movements: Extraocular movements intact.     Conjunctiva/sclera: Conjunctivae normal.     Pupils: Pupils are equal, round, and reactive to light.  Cardiovascular:     Rate and Rhythm: Normal rate and regular rhythm.     Pulses: Normal pulses.     Heart sounds: Normal heart sounds.  Abdominal:     General: Abdomen is flat. Bowel sounds are normal.     Palpations: Abdomen is soft.  Musculoskeletal:        General: Deformity (Left great toe amputation) present. Normal range of motion.     Cervical back: Normal range of motion and neck supple.     Right lower leg: Edema present.  Skin:    General: Skin is warm.     Capillary Refill: Capillary refill takes less than 2 seconds.  Neurological:     General: No focal deficit present.     Mental Status: He is alert and oriented to person, place, and time.       Assessment & Plan:     1. Type 2 diabetes mellitus with diabetic neuropathy, without long-term current use of insulin (HCC) Continue current medications we will obtain repeat labs in 4 weeks - HgB A1c; Future - CBC w/Diff - Comp Met (CMET) - Lipid Profile  2. Rash Likely allergic in nature.  Will order some triamcinolone  ointment to be applied to affected areas twice a day  3. Hypertension associated with diabetes ([place]) Will increase amlodipine to 10 mg daily.  Patient has been advised to stagger his blood pressure medications and takes them in the morning and some in the evening.  4. History of peripheral arterial disease Follow-up with vascular surgery.  Smoking cessation reviewed at length with patient.  5. Hyperlipidemia associated with type 2 diabetes mellitus ([place]) We will continue aggressive lipid management in light of diabetes and peripheral artery disease.  Need for smoking cessation reviewed at length with patient.  Continue current dose of statin.  6.  Current  everyday smoker  Smoking cessation advice given.  Patient is not willing to quit at this time  Deforest Hoyles, NP   [facility]

## 2020-05-26 ENCOUNTER — Encounter: Payer: Self-pay | Admitting: Pulmonary Disease

## 2020-05-26 ENCOUNTER — Institutional Professional Consult (permissible substitution): Payer: Self-pay | Admitting: Internal Medicine

## 2020-05-26 ENCOUNTER — Other Ambulatory Visit: Payer: Self-pay | Admitting: Gerontology

## 2020-05-26 ENCOUNTER — Other Ambulatory Visit: Payer: Self-pay | Admitting: Pulmonary Disease

## 2020-05-26 ENCOUNTER — Ambulatory Visit (INDEPENDENT_AMBULATORY_CARE_PROVIDER_SITE_OTHER): Payer: Self-pay | Admitting: Pulmonary Disease

## 2020-05-26 VITALS — BP 144/86 | HR 81 | Temp 97.7°F | Ht 70.0 in | Wt 203.6 lb

## 2020-05-26 DIAGNOSIS — R0602 Shortness of breath: Secondary | ICD-10-CM

## 2020-05-26 DIAGNOSIS — J449 Chronic obstructive pulmonary disease, unspecified: Secondary | ICD-10-CM

## 2020-05-26 DIAGNOSIS — R0989 Other specified symptoms and signs involving the circulatory and respiratory systems: Secondary | ICD-10-CM

## 2020-05-26 DIAGNOSIS — F17210 Nicotine dependence, cigarettes, uncomplicated: Secondary | ICD-10-CM

## 2020-05-26 DIAGNOSIS — F4310 Post-traumatic stress disorder, unspecified: Secondary | ICD-10-CM

## 2020-05-26 DIAGNOSIS — I2510 Atherosclerotic heart disease of native coronary artery without angina pectoris: Secondary | ICD-10-CM

## 2020-05-26 MED ORDER — TRELEGY ELLIPTA 100-62.5-25 MCG/INH IN AEPB: 1.0000 | INHALATION_SPRAY | Freq: Every day | RESPIRATORY_TRACT | 11 refills | Status: DC

## 2020-05-26 MED ORDER — TRELEGY ELLIPTA 100-62.5-25 MCG/INH IN AEPB: 1.0000 | INHALATION_SPRAY | Freq: Every day | RESPIRATORY_TRACT | 0 refills | Status: DC

## 2020-05-26 MED ORDER — ALBUTEROL SULFATE HFA 108 (90 BASE) MCG/ACT IN AERS: 1.0000 | INHALATION_SPRAY | Freq: Four times a day (QID) | RESPIRATORY_TRACT | 11 refills | Status: DC | PRN

## 2020-05-26 NOTE — Progress Notes (Signed)
Subjective:    Patient ID: Timothy Main., male    DOB: 09-05-1960, 59 y.o.   MRN: [id number]  HPI Patient is a 59 year old gentleman, current smoker (1.5 PPD) with history as noted below, who presents for evaluation of shortness of breath.  He is referred by Althia Forts of the [facility].  Patient notes that his symptoms started approximately 3 to 4 years ago.  He noted mostly when climbing stairs and states that he cannot "finish a full dance".  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  He notes that when he uses his inhalers and he rests his shortness of breath improves.  He has had issues with chest pain which is ill characterized been present for approximately on and off a year this has not been evaluated.  He does note sputum production at least 3 to 4 months out of the year most recently it has been foamy and white.  He has been diagnosed with bronchitis previously.  He states that when he lays on his left side he feels that he cannot empty out his lungs completely.  He occasionally will feel the sensation when he is exerting himself as well.  He has issues with gastroesophageal reflux for which he takes Nexium which he states helps  He has been on Advair 250/51 inhalation twice a day but apparently is taking this sometimes more often than twice a day because of shortness of breath.  He is on as needed albuterol, recently this was switched to ProAir from Ventolin brand and he feels that the ProAir brand gives him a headache.  The inhalers do help some but not consistently.  He has not had any military history.  Currently unemployed.   Review of Systems A 10 point review of systems was performed and it is as noted above otherwise negative.  Past Medical History:  Diagnosis Date  . Diabetes mellitus without complication ([place])   . Hypercholesteremia   . Hypertension    Patient Active Problem List   Diagnosis Date Noted   . Osteomyelitis ([place]) 03/15/2020  . Open wound of foot 01/20/2020  . Elevated lipids 01/20/2020  . Personal history of colonic polyps   . Shortness of breath 11/04/2019  . Encounter to establish care 10/15/2019  . Essential hypertension 10/15/2019  . Controlled diabetes mellitus type 2 with complications ([place]) 42/68/3419  . Smoking 10/15/2019  . Peripheral neuropathy 10/15/2019  . Cramp, abdominal 10/15/2019  . History of hepatitis 10/15/2019  . History of gastroesophageal reflux (GERD) 10/15/2019  . Type 2 diabetes mellitus with diabetic neuropathy, unspecified ([place]) 10/15/2019  . Post-traumatic stress disorder, unspecified 07/14/2019  . Antisocial personality disorder ([place]) 07/14/2019   Family History  Family history unknown: Yes   Social History   Tobacco Use  . Smoking status: Current Every Day Smoker    Packs/day: 1.50    Years: 41.00    Pack years: 61.50    Types: Cigarettes  . Smokeless tobacco: Never Used  Substance Use Topics  . Alcohol use: Yes    Alcohol/week: 6.0 standard drinks    Types: 6 Cans of beer per week    Comment: occasionally    Allergies  Allergen Reactions  . Bee Venom Anaphylaxis  . Coffee Flavor Shortness Of Breath  . Cheese Swelling    Blue Cheese only   Current Meds  Medication Sig  . Acetaminophen (ARTHRITIS PAIN PO) Take 1,000 mg by mouth daily.  Marland Kitchen  ADVAIR DISKUS 250-50 MCG/DOSE AEPB INHALE 1 PUFF INTO THE LUNGS 2 TIMES A DAY  . amLODipine (NORVASC) 10 MG tablet Take 1 tablet (10 mg total) by mouth daily.  . blood glucose meter kit and supplies KIT Dispense based on patient and insurance preference. Use up to four times daily as directed. (FOR ICD-9 250.00, 250.01).  . calcium carbonate (TUMS - DOSED IN MG ELEMENTAL CALCIUM) 500 MG chewable tablet Chew by mouth.  . citalopram (CELEXA) 20 MG tablet Take 1 tablet (20 mg total) by mouth daily.  . cloNIDine (CATAPRES) 0.1 MG tablet Take 1 tablet (0.1 mg total) by mouth at bedtime.  .  clopidogrel (PLAVIX) 75 MG tablet Take 1 tablet (75 mg total) by mouth daily with breakfast.  . esomeprazole (NEXIUM) 20 MG capsule Take 1 capsule (20 mg total) by mouth daily at 12 noon.  . gabapentin (NEURONTIN) 300 MG capsule Take 1 capsule (300 mg total) by mouth 2 (two) times daily.  Marland Kitchen glipiZIDE (GLUCOTROL) 5 MG tablet Take 1 tablet (5 mg total) by mouth 2 (two) times daily before a meal.  . Magnesium 400 MG CAPS Take 400 mg by mouth daily.  . metFORMIN (GLUCOPHAGE) 1000 MG tablet Take 1 tablet (1,000 mg total) by mouth 2 (two) times daily with a meal.  . rosuvastatin (CRESTOR) 5 MG tablet Take 1 tablet (5 mg total) by mouth daily.  Marland Kitchen triamcinolone (KENALOG) 0.025 % ointment Apply 1 application topically 2 (two) times daily. To buttocks  . [DISCONTINUED] albuterol (VENTOLIN HFA) 108 (90 Base) MCG/ACT inhaler Inhale 1 puff into the lungs every 6 (six) hours as needed for wheezing or shortness of breath.       Objective:   Physical Exam BP (!) 144/86 (BP Location: Left Arm, Cuff Size: Normal)   Pulse 81   Temp 97.7 F (36.5 C) (Temporal)   Ht '5\' 10"'  (1.778 m)   Wt 203 lb 9.6 oz (92.4 kg)   SpO2 99%   BMI 29.21 kg/m  GENERAL: Well-developed well-nourished gentleman in no acute distress.  Fully ambulatory. HEAD: Normocephalic, atraumatic.  EYES: Pupils equal, round, reactive to light.  No scleral icterus.  MOUTH: Nose/mouth/throat not examined due to masking requirements for COVID 19. NECK: Supple. No thyromegaly. Trachea midline. No JVD.  No adenopathy. PULMONARY: Good air entry bilaterally.  No adventitious sounds. CARDIOVASCULAR: S1 and S2. Regular rate and rhythm.  No rubs, murmurs or gallops heard. ABDOMEN: Benign. MUSCULOSKELETAL: No clubbing, no edema.  Left foot in boot, status post amputation of the great toe due to osteomyelitis. NEUROLOGIC: No focal deficit, speech is fluent, SKIN: Intact,warm,dry. PSYCH: Flat affect, normal behavior.     Assessment & Plan:      ICD-10-CM   1. Shortness of breath  R06.02 Pulmonary Function Test [facility] Only    ECHOCARDIOGRAM COMPLETE    albuterol (VENTOLIN HFA) 108 (90 Base) MCG/ACT inhaler   Will obtain PFTs We will obtain 2D echo to evaluate for potential cardiac etiologies   2. COPD suggested by initial evaluation ([place])  J44.9    PFTs as above Patient having symptoms consistent with air trapping Switch Advair to Trelegy Ellipta 100/62.5/25, 1 inhalation daily  3. Coronary artery calcification seen on CAT scan  I25.10    Patient would benefit from cardiology evaluation He is high risk for coronary disease due to PVD and diabetes  4. Tobacco dependence due to cigarettes  F17.210    Patient was counseled regards to discontinuation of smoking Encouraged to call the  smoking cessation hotline for [place]   Orders Placed This Encounter  Procedures  . Pulmonary Function Test [facility] Only    Standing Status:   Future    Standing Expiration Date:   05/26/2021    Scheduling Instructions:     Next available.    Order Specific Question:   Full PFT: includes the following: basic spirometry, spirometry pre & post bronchodilator, diffusion capacity (DLCO), lung volumes    Answer:   Full PFT  . ECHOCARDIOGRAM COMPLETE    Standing Status:   Future    Standing Expiration Date:   11/24/2020    Order Specific Question:   Where should this test be performed    Answer:   [facility]    Order Specific Question:   Please indicate who you request to read the echo results.    Answer:   [facility]    Order Specific Question:   Perflutren DEFINITY (image enhancing agent) should be administered unless hypersensitivity or allergy exist    Answer:   Administer Perflutren    Order Specific Question:   Reason for exam-Echo    Answer:   Dyspnea  786.09 / R06.00   Meds ordered this encounter  Medications  . Fluticasone-Umeclidin-Vilant (TRELEGY ELLIPTA) 100-62.5-25 MCG/INH AEPB    Sig: Inhale 1 puff into the lungs daily for  1 day.    Dispense:  28 each    Refill:  0    Order Specific Question:   Lot Number?    Answer:   [id number]    Order Specific Question:   Expiration Date?    Answer:   11/16/2021    Order Specific Question:   Manufacturer?    Answer:   Gl[employer]SmithK[employer] [12]    Order Specific Question:   Quantity    Answer:   2  . albuterol (VENTOLIN HFA) 108 (90 Base) MCG/ACT inhaler    Sig: Inhale 1 puff into the lungs every 6 (six) hours as needed for wheezing or shortness of breath.    Dispense:  18 g    Refill:  11    DID NOT TOLERATE PROAIR   Smoking cessation instruction/counseling given:  counseled patient on the dangers of tobacco use, advised patient to stop smoking, and reviewed strategies to maximize success  Discussion:  Patient's dyspnea could be multifactorial.  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  See orders and medication changes as above.  He does seem to have symptoms consistent with air trapping (feels inability to expel all air).  Combination of LABA/ICS/LAMA will be helpful in this situation.  Of note the patient did have evidence of coronary calcification on CT scan of the chest performed for lung cancer screening, this may indicate significant coronary disease.  He is at high risk due to coexistent PVD and diabetes and tobacco use.  Will obtain 2D echo but recommend referral to cardiology.  Will defer to primary care practitioner.  We will see the patient in follow-up in 2 months time he is to contact us prior to that time should any new difficulties arise.  Renold Don, MD [facility]   *This note was dictated using voice recognition software/Dragon.  Despite best efforts to proofread, errors can occur which can change the meaning.  Any change was purely unintentional.

## 2020-05-26 NOTE — Patient Instructions (Signed)
We are giving you a trial of Trelegy Ellipta 1 inhalation daily.  We will send a prescription to the medication management clinic.  We will try to keep you on the Ventolin inhaler since it seems like you do better with that.  Call the tobacco cessation quit line by calling the main hospital line and asking for the smoking cessation program.  We are going to get breathing tests and an echocardiogram (heart test).  We will see her in follow-up in 2 months time call sooner should any new problems arise.

## 2020-05-30 ENCOUNTER — Telehealth: Payer: Self-pay | Admitting: Pharmacist

## 2020-05-30 NOTE — Telephone Encounter (Signed)
05/30/2020 9:13:41 AM - Trelegy Ellipta script to provider  -- Rhetta Mura - Monday, May 30, 2020 9:12 AM --Received pharmacy printout for new med-Trelegy Ellipta 100/1-62.5/25/83mcg Inhale 1 puff into the lungs every day. Sending script to [facility] for provider to sign. Patient is already enrolled with [facility].

## 2020-05-31 ENCOUNTER — Other Ambulatory Visit: Payer: Self-pay | Admitting: Gerontology

## 2020-06-01 ENCOUNTER — Telehealth: Payer: Self-pay | Admitting: Pharmacist

## 2020-06-01 NOTE — Telephone Encounter (Signed)
06/01/2020 9:20:59 AM - Trelegy Ellipta script faxed to [facility]  -- Rhetta Mura - Wednesday, June 01, 2020 9:19 AM --Arneta Cliche Trelegy Ellipta script to [facility] for processing-New Med.

## 2020-06-03 ENCOUNTER — Telehealth: Payer: Self-pay | Admitting: Pharmacist

## 2020-06-03 NOTE — Telephone Encounter (Signed)
06/03/2020 1:30:41 PM - Advair refill online with [facility]  -- Rhetta Mura - Friday, June 03, 2020 1:29 PM --Placed refill online with [facility] OZ#[id number] Advair 250/50--order# [id number]

## 2020-06-09 ENCOUNTER — Telehealth: Payer: Self-pay

## 2020-06-09 NOTE — Telephone Encounter (Signed)
Lm to relay date/time of covid test prior to PFT.  [facility]

## 2020-06-15 ENCOUNTER — Other Ambulatory Visit: Payer: Self-pay

## 2020-06-15 ENCOUNTER — Other Ambulatory Visit
Admission: RE | Admit: 2020-06-15 | Discharge: 2020-06-15 | Disposition: A | Discharge status: Home / Self Care | Payer: Medicaid Other | Source: Ambulatory Visit | Attending: General Surgery | Admitting: General Surgery

## 2020-06-15 DIAGNOSIS — Z20822 Contact with and (suspected) exposure to covid-19: Secondary | ICD-10-CM | POA: Diagnosis not present

## 2020-06-15 DIAGNOSIS — Z01812 Encounter for preprocedural laboratory examination: Secondary | ICD-10-CM | POA: Diagnosis not present

## 2020-06-15 LAB — SARS CORONAVIRUS 2 (TAT 6-24 HRS): SARS Coronavirus 2: NEGATIVE

## 2020-06-15 NOTE — Telephone Encounter (Signed)
Lm x2. Will close encounter per office protocol.  

## 2020-06-16 ENCOUNTER — Ambulatory Visit (INDEPENDENT_AMBULATORY_CARE_PROVIDER_SITE_OTHER): Payer: Self-pay

## 2020-06-16 ENCOUNTER — Ambulatory Visit: Payer: Medicaid Other | Attending: Pulmonary Disease

## 2020-06-16 DIAGNOSIS — Z79899 Other long term (current) drug therapy: Secondary | ICD-10-CM | POA: Diagnosis not present

## 2020-06-16 DIAGNOSIS — F17210 Nicotine dependence, cigarettes, uncomplicated: Secondary | ICD-10-CM | POA: Insufficient documentation

## 2020-06-16 DIAGNOSIS — R0602 Shortness of breath: Secondary | ICD-10-CM

## 2020-06-16 MED ORDER — ALBUTEROL SULFATE (2.5 MG/3ML) 0.083% IN NEBU
2.5000 mg | INHALATION_SOLUTION | Freq: Once | RESPIRATORY_TRACT | Status: AC
  Administered 2020-06-16: 16:00:00 2.5 mg via RESPIRATORY_TRACT
  Filled 2020-06-16: qty 3

## 2020-06-16 MED ORDER — PERFLUTREN LIPID MICROSPHERE
1.0000 mL | INTRAVENOUS | Status: AC | PRN
  Administered 2020-06-16: 2 mL via INTRAVENOUS

## 2020-06-17 LAB — ECHOCARDIOGRAM COMPLETE
AR max vel: 3.57 cm2
AV Area VTI: 4.08 cm2
AV Area mean vel: 3.78 cm2
AV Mean grad: 5 mmHg
AV Peak grad: 10.5 mmHg
Ao pk vel: 1.62 m/s
Area-P 1/2: 4.12 cm2
S' Lateral: 3.2 cm

## 2020-06-21 ENCOUNTER — Other Ambulatory Visit: Payer: Self-pay

## 2020-06-22 ENCOUNTER — Other Ambulatory Visit: Payer: Self-pay

## 2020-06-22 DIAGNOSIS — E1140 Type 2 diabetes mellitus with diabetic neuropathy, unspecified: Secondary | ICD-10-CM

## 2020-06-22 LAB — PULMONARY FUNCTION TEST ARMC ONLY
DL/VA % pred: 83 %
DL/VA: 3.58 ml/min/mmHg/L
DLCO unc % pred: 44 %
DLCO unc: 12.33 ml/min/mmHg
FEF 25-75 Post: 1.32 L/sec
FEF 25-75 Pre: 1.12 L/sec
FEF2575-%Change-Post: 18 %
FEF2575-%Pred-Post: 43 %
FEF2575-%Pred-Pre: 36 %
FEV1-%Change-Post: 14 %
FEV1-%Pred-Post: 47 %
FEV1-%Pred-Pre: 42 %
FEV1-Post: 1.76 L
FEV1-Pre: 1.54 L
FEV1FVC-%Change-Post: 16 %
FEV1FVC-%Pred-Pre: 79 %
FEV6-%Change-Post: -2 %
FEV6-%Pred-Post: 53 %
FEV6-%Pred-Pre: 54 %
FEV6-Post: 2.47 L
FEV6-Pre: 2.54 L
FEV6FVC-%Pred-Post: 104 %
FEV6FVC-%Pred-Pre: 104 %
FVC-%Change-Post: -1 %
FVC-%Pred-Post: 51 %
FVC-%Pred-Pre: 52 %
FVC-Post: 2.5 L
Post FEV1/FVC ratio: 71 %
Post FEV6/FVC ratio: 100 %
Pre FEV1/FVC ratio: 61 %
Pre FEV6/FVC Ratio: 100 %
RV % pred: 28 %
RV: 0.62 L
TLC % pred: 52 %
TLC: 3.7 L

## 2020-06-23 ENCOUNTER — Telehealth: Payer: Self-pay

## 2020-06-23 ENCOUNTER — Ambulatory Visit (HOSPITAL_COMMUNITY): Payer: No Payment, Other | Admitting: Licensed Clinical Social Worker

## 2020-06-23 LAB — CBC WITH DIFFERENTIAL/PLATELET
Basophils Absolute: 0 10*3/uL (ref 0.0–0.2)
Basos: 1 %
EOS (ABSOLUTE): 0.1 10*3/uL (ref 0.0–0.4)
Eos: 2 %
Hematocrit: 41.8 % (ref 37.5–51.0)
Hemoglobin: 14.6 g/dL (ref 13.0–17.7)
Immature Grans (Abs): 0 10*3/uL (ref 0.0–0.1)
Immature Granulocytes: 1 %
Lymphocytes Absolute: 1.2 10*3/uL (ref 0.7–3.1)
Lymphs: 25 %
MCH: 33.6 pg — ABNORMAL HIGH (ref 26.6–33.0)
MCHC: 34.9 g/dL (ref 31.5–35.7)
MCV: 96 fL (ref 79–97)
Monocytes Absolute: 0.7 10*3/uL (ref 0.1–0.9)
Monocytes: 14 %
Neutrophils Absolute: 2.6 10*3/uL (ref 1.4–7.0)
Neutrophils: 57 %
Platelets: 301 10*3/uL (ref 150–450)
RBC: 4.35 x10E6/uL (ref 4.14–5.80)
RDW: 13.1 % (ref 11.6–15.4)
WBC: 4.6 10*3/uL (ref 3.4–10.8)

## 2020-06-23 LAB — COMPREHENSIVE METABOLIC PANEL
ALT: 30 IU/L (ref 0–44)
AST: 41 IU/L — ABNORMAL HIGH (ref 0–40)
Albumin/Globulin Ratio: 1.4 (ref 1.2–2.2)
Albumin: 4.3 g/dL (ref 3.8–4.9)
Alkaline Phosphatase: 119 IU/L (ref 44–121)
BUN/Creatinine Ratio: 9 (ref 9–20)
BUN: 8 mg/dL (ref 6–24)
Bilirubin Total: 0.3 mg/dL (ref 0.0–1.2)
CO2: 21 mmol/L (ref 20–29)
Calcium: 9.4 mg/dL (ref 8.7–10.2)
Chloride: 96 mmol/L (ref 96–106)
Creatinine, Ser: 0.91 mg/dL (ref 0.76–1.27)
GFR calc Af Amer: 107 mL/min/{1.73_m2} (ref >59)
GFR calc non Af Amer: 93 mL/min/{1.73_m2} (ref >59)
Globulin, Total: 3 g/dL (ref 1.5–4.5)
Glucose: 202 mg/dL — ABNORMAL HIGH (ref 65–99)
Potassium: 4.3 mmol/L (ref 3.5–5.2)
Sodium: 134 mmol/L (ref 134–144)
Total Protein: 7.3 g/dL (ref 6.0–8.5)

## 2020-06-23 LAB — HEMOGLOBIN A1C
Est. average glucose Bld gHb Est-mCnc: 174 mg/dL
Hgb A1c MFr Bld: 7.7 % — ABNORMAL HIGH (ref 4.8–5.6)

## 2020-06-23 LAB — LIPID PANEL
Chol/HDL Ratio: 3.8 ratio (ref 0.0–5.0)
Cholesterol, Total: 167 mg/dL (ref 100–199)
HDL: 44 mg/dL (ref >39)
LDL Chol Calc (NIH): 70 mg/dL (ref 0–99)
Triglycerides: 335 mg/dL — ABNORMAL HIGH (ref 0–149)
VLDL Cholesterol Cal: 53 mg/dL — ABNORMAL HIGH (ref 5–40)

## 2020-06-23 NOTE — Telephone Encounter (Signed)
created in error.

## 2020-06-29 ENCOUNTER — Ambulatory Visit: Payer: Self-pay | Admitting: Gerontology

## 2020-06-29 ENCOUNTER — Other Ambulatory Visit: Payer: Self-pay | Admitting: Gerontology

## 2020-06-29 ENCOUNTER — Encounter: Payer: Self-pay | Admitting: Gerontology

## 2020-06-29 ENCOUNTER — Other Ambulatory Visit: Payer: Self-pay

## 2020-06-29 VITALS — BP 121/78 | HR 80 | Temp 98.0°F | Resp 16 | Wt 203.6 lb

## 2020-06-29 DIAGNOSIS — G6289 Other specified polyneuropathies: Secondary | ICD-10-CM

## 2020-06-29 DIAGNOSIS — F4310 Post-traumatic stress disorder, unspecified: Secondary | ICD-10-CM

## 2020-06-29 DIAGNOSIS — E785 Hyperlipidemia, unspecified: Secondary | ICD-10-CM

## 2020-06-29 DIAGNOSIS — E1140 Type 2 diabetes mellitus with diabetic neuropathy, unspecified: Secondary | ICD-10-CM

## 2020-06-29 DIAGNOSIS — Z8719 Personal history of other diseases of the digestive system: Secondary | ICD-10-CM

## 2020-06-29 DIAGNOSIS — R0602 Shortness of breath: Secondary | ICD-10-CM

## 2020-06-29 MED ORDER — GABAPENTIN 300 MG PO CAPS: 300.0000 mg | ORAL_CAPSULE | Freq: Three times a day (TID) | ORAL | 2 refills | Status: DC

## 2020-06-29 MED ORDER — ESOMEPRAZOLE MAGNESIUM 20 MG PO CPDR: 20.0000 mg | DELAYED_RELEASE_CAPSULE | Freq: Every day | ORAL | 3 refills | Status: DC

## 2020-06-29 MED ORDER — METFORMIN HCL 1000 MG PO TABS: 1000.0000 mg | ORAL_TABLET | Freq: Two times a day (BID) | ORAL | 3 refills | Status: DC

## 2020-06-29 MED ORDER — GLIPIZIDE 5 MG PO TABS: ORAL_TABLET | ORAL | 2 refills | Status: DC

## 2020-06-29 MED ORDER — ROSUVASTATIN CALCIUM 5 MG PO TABS: 5.0000 mg | ORAL_TABLET | Freq: Every day | ORAL | 3 refills | Status: DC

## 2020-06-29 NOTE — Patient Instructions (Signed)
Smoking Tobacco Information, Adult Smoking tobacco can be harmful to your health. Tobacco contains a poisonous (toxic), colorless chemical called nicotine. Nicotine is addictive. It changes the brain and can make it hard to stop smoking. Tobacco also has other toxic chemicals that can hurt your body and raise your risk of many cancers. How can smoking tobacco affect me? Smoking tobacco puts you at risk for:  Cancer. Smoking is most commonly associated with lung cancer, but can also lead to cancer in other parts of the body.  Chronic obstructive pulmonary disease (COPD). This is a long-term lung condition that makes it hard to breathe. It also gets worse over time.  High blood pressure (hypertension), heart disease, stroke, or heart attack.  Lung infections, such as pneumonia.  Cataracts. This is when the lenses in the eyes become clouded.  Digestive problems. This may include peptic ulcers, heartburn, and gastroesophageal reflux disease (GERD).  Oral health problems, such as gum disease and tooth loss.  Loss of taste and smell. Smoking can affect your appearance by causing:  Wrinkles.  Yellow or stained teeth, fingers, and fingernails. Smoking tobacco can also affect your social life, because:  It may be challenging to find places to smoke when away from home. Many workplaces, restaurants, hotels, and public places are tobacco-free.  Smoking is expensive. This is due to the cost of tobacco and the long-term costs of treating health problems from smoking.  Secondhand smoke may affect those around you. Secondhand smoke can cause lung cancer, breathing problems, and heart disease. Children of smokers have a higher risk for: ? Sudden infant death syndrome (SIDS). ? Ear infections. ? Lung infections. If you currently smoke tobacco, quitting now can help you:  Lead a longer and healthier life.  Look, smell, breathe, and feel better over time.  Save money.  Protect others from the  harms of secondhand smoke. What actions can I take to prevent health problems? Quit smoking  Do not start smoking. Quit if you already do.  Make a plan to quit smoking and commit to it. Look for programs to help you and ask your health care provider for recommendations and ideas.  Set a date and write down all the reasons you want to quit.  Let your friends and family know you are quitting so they can help and support you. Consider finding friends who also want to quit. It can be easier to quit with someone else, so that you can support each other.  Talk with your health care provider about using nicotine replacement medicines to help you quit, such as gum, lozenges, patches, sprays, or pills.  Do not replace cigarette smoking with electronic cigarettes, which are commonly called e-cigarettes. The safety of e-cigarettes is not known, and some may contain harmful chemicals.  If you try to quit but return to smoking, stay positive. It is common to slip up when you first quit, so take it one day at a time.  Be prepared for cravings. When you feel the urge to smoke, chew gum or suck on hard candy.   Lifestyle  Stay busy and take care of your body.  Drink enough fluid to keep your urine pale yellow.  Get plenty of exercise and eat a healthy diet. This can help prevent weight gain after quitting.  Monitor your eating habits. Quitting smoking can cause you to have a larger appetite than when you smoke.  Find ways to relax. Go out with friends or family to a movie or a   restaurant where people do not smoke.  Ask your health care provider about having regular tests (screenings) to check for cancer. This may include blood tests, imaging tests, and other tests.  Find ways to manage your stress, such as meditation, yoga, or exercise. Where to find support To get support to quit smoking, consider:  Asking your health care provider for more information and resources.  Taking classes to learn  more about quitting smoking.  Looking for local organizations that offer resources about quitting smoking.  Joining a support group for people who want to quit smoking in your local community.  Calling the smokefree.gov counselor helpline: 1-800-Quit-Now (1-800-784-8669) Where to find more information You may find more information about quitting smoking from:  HelpGuide.org: www.helpguide.org  Smokefree.gov: smokefree.gov  American Lung Association: www.lung.org Contact a health care provider if you:  Have problems breathing.  Notice that your lips, nose, or fingers turn blue.  Have chest pain.  Are coughing up blood.  Feel faint or you pass out.  Have other health changes that cause you to worry. Summary  Smoking tobacco can negatively affect your health, the health of those around you, your finances, and your social life.  Do not start smoking. Quit if you already do. If you need help quitting, ask your health care provider.  Think about joining a support group for people who want to quit smoking in your local community. There are many effective programs that will help you to quit this behavior. This information is not intended to replace advice given to you by your health care provider. Make sure you discuss any questions you have with your health care provider. Document Revised: 02/27/2019 Document Reviewed: 06/19/2016 Elsevier Patient Education  2021 Elsevier Inc. https://www.diabeteseducator.org/docs/default-source/living-with-diabetes/conquering-the-grocery-store-v1.pdf?sfvrsn=4">  Carbohydrate Counting for Diabetes Mellitus, Adult Carbohydrate counting is a method of keeping track of how many carbohydrates you eat. Eating carbohydrates naturally increases the amount of sugar (glucose) in the blood. Counting how many carbohydrates you eat improves your blood glucose control, which helps you manage your diabetes. It is important to know how many carbohydrates you can  safely have in each meal. This is different for every person. A dietitian can help you make a meal plan and calculate how many carbohydrates you should have at each meal and snack. What foods contain carbohydrates? Carbohydrates are found in the following foods:  Grains, such as breads and cereals.  Dried beans and soy products.  Starchy vegetables, such as potatoes, peas, and corn.  Fruit and fruit juices.  Milk and yogurt.  Sweets and snack foods, such as cake, cookies, candy, chips, and soft drinks.   How do I count carbohydrates in foods? There are two ways to count carbohydrates in food. You can read food labels or learn standard serving sizes of foods. You can use either of the methods or a combination of both. Using the Nutrition Facts label The Nutrition Facts list is included on the labels of almost all packaged foods and beverages in the [place] It includes:  The serving size.  Information about nutrients in each serving, including the grams (g) of carbohydrate per serving. To use the Nutrition Facts:  Decide how many servings you will have.  Multiply the number of servings by the number of carbohydrates per serving.  The resulting number is the total amount of carbohydrates that you will be having. Learning the standard serving sizes of foods When you eat carbohydrate foods that are not packaged or do not include Nutrition Facts on the   label, you need to measure the servings in order to count the amount of carbohydrates.  Measure the foods that you will eat with a food scale or measuring cup, if needed.  Decide how many standard-size servings you will eat.  Multiply the number of servings by 15. For foods that contain carbohydrates, one serving equals 15 g of carbohydrates. ? For example, if you eat 2 cups or 10 oz (300 g) of strawberries, you will have eaten 2 servings and 30 g of carbohydrates (2 servings x 15 g = 30 g).  For foods that have more than one food mixed,  such as soups and casseroles, you must count the carbohydrates in each food that is included. The following list contains standard serving sizes of common carbohydrate-rich foods. Each of these servings has about 15 g of carbohydrates:  1 slice of bread.  1 six-inch (15 cm) tortilla.  ? cup or 2 oz (53 g) cooked rice or pasta.   cup or 3 oz (85 g) cooked or canned, drained and rinsed beans or lentils.   cup or 3 oz (85 g) starchy vegetable, such as peas, corn, or squash.   cup or 4 oz (120 g) hot cereal.   cup or 3 oz (85 g) boiled or mashed potatoes, or  or 3 oz (85 g) of a large baked potato.   cup or 4 fl oz (118 mL) fruit juice.  1 cup or 8 fl oz (237 mL) milk.  1 small or 4 oz (106 g) apple.   or 2 oz (63 g) of a medium banana.  1 cup or 5 oz (150 g) strawberries.  3 cups or 1 oz (24 g) popped popcorn. What is an example of carbohydrate counting? To calculate the number of carbohydrates in this sample meal, follow the steps shown below. Sample meal  3 oz (85 g) chicken breast.  ? cup or 4 oz (106 g) brown rice.   cup or 3 oz (85 g) corn.  1 cup or 8 fl oz (237 mL) milk.  1 cup or 5 oz (150 g) strawberries with sugar-free whipped topping. Carbohydrate calculation 1. Identify the foods that contain carbohydrates: ? Rice. ? Corn. ? Milk. ? Strawberries. 2. Calculate how many servings you have of each food: ? 2 servings rice. ? 1 serving corn. ? 1 serving milk. ? 1 serving strawberries. 3. Multiply each number of servings by 15 g: ? 2 servings rice x 15 g = 30 g. ? 1 serving corn x 15 g = 15 g. ? 1 serving milk x 15 g = 15 g. ? 1 serving strawberries x 15 g = 15 g. 4. Add together all of the amounts to find the total grams of carbohydrates eaten: ? 30 g + 15 g + 15 g + 15 g = 75 g of carbohydrates total. What are tips for following this plan? Shopping  Develop a meal plan and then make a shopping list.  Buy fresh and frozen vegetables, fresh  and frozen fruit, dairy, eggs, beans, lentils, and whole grains.  Look at food labels. Choose foods that have more fiber and less sugar.  Avoid processed foods and foods with added sugars. Meal planning  Aim to have the same amount of carbohydrates at each meal and for each snack time.  Plan to have regular, balanced meals and snacks. Where to find more information  American Diabetes Association: www.diabetes.org  Centers for Disease Control and Prevention: www.cdc.gov Summary  Carbohydrate   counting is a method of keeping track of how many carbohydrates you eat.  Eating carbohydrates naturally increases the amount of sugar (glucose) in the blood.  Counting how many carbohydrates you eat improves your blood glucose control, which helps you manage your diabetes.  A dietitian can help you make a meal plan and calculate how many carbohydrates you should have at each meal and snack. This information is not intended to replace advice given to you by your health care provider. Make sure you discuss any questions you have with your health care provider. Document Revised: 06/04/2019 Document Reviewed: 06/05/2019 Elsevier Patient Education  2021 Elsevier Inc.  

## 2020-06-29 NOTE — Progress Notes (Signed)
Established Patient Office Visit  Subjective:  Patient ID: Timothy Plotts., male    DOB: [birth year]  Age: 60 y.o. MRN: [id number]  CC: No chief complaint on file.   HPI Timothy [name]. presents for follow up of type 2 diabetes, hypertension, lab review and medication refill. Her HgbA1c done on 06/22/2020 decreased from 8.5% to 7.7%, he checks his blood glucose daily and it ranges between 169-224 mg/dl and his fasting reading today was 204 mg/dl. He denies hypo/hyperglycemic symptoms. He reports that taking gabapentin 300 mg bid is not relieivng his peripheral neuropathy. He states that he continues to experience shortness of breath with activities such as moving around, climbing steps, but he denies shortness of breath when at rest. He states that he sleeps with 3 pillows and continues to smoke 1-2 packs of cigarette daily and denies the desire to quit. He had Echo done on 06/16/20 and his EF was 60-65%, but showed left atrial size was mildly dilated. He states that his mood fluctuates, denies suicidal nor homicidal ideation and requests a mental health provider. Overall, he states that he's doing well and offers no further complaint.  Past Medical History:  Diagnosis Date  . Diabetes mellitus without complication ([place])   . Hypercholesteremia   . Hypertension     Past Surgical History:  Procedure Laterality Date  . AMPUTATION Left 03/22/2020   Procedure: AMPUTATION RAY-1st Ray Left Foot;  Surgeon: [name], [name];  Location: [facility] ORS;  Service: Podiatry;  Laterality: Left;  . AMPUTATION TOE Left 03/16/2020   Procedure: AMPUTATION GREAT TOE;  Surgeon: [name], [name];  Location: [facility] ORS;  Service: Podiatry;  Laterality: Left;  . COLONOSCOPY WITH PROPOFOL N/A 12/22/2019   Procedure: COLONOSCOPY WITH PROPOFOL;  Surgeon: [name], MD;  Location: [facility] ENDOSCOPY;  Service: Endoscopy;  Laterality: N/A;  . LOWER EXTREMITY ANGIOGRAPHY Left 03/18/2020   Procedure: Lower Extremity  Angiography;  Surgeon: [name], MD;  Location: [facility];  Service: Cardiovascular;  Laterality: Left;  . TONSILLECTOMY    . WISDOM TOOTH EXTRACTION      Family History  Family history unknown: Yes    Social History   Socioeconomic History  . Marital status: Single    Spouse name: Not on file  . Number of children: 0  . Years of education: Not on file  . Highest education level: Not on file  Occupational History  . Occupation: Unemployed  Tobacco Use  . Smoking status: Current Every Day Smoker    Packs/day: 1.50    Years: 41.00    Pack years: 61.50    Types: Cigarettes  . Smokeless tobacco: Never Used  Vaping Use  . Vaping Use: Never used  Substance and Sexual Activity  . Alcohol use: Yes    Alcohol/week: 6.0 standard drinks    Types: 6 Cans of beer per week    Comment: occasionally   . Drug use: Not Currently  . Sexual activity: Not Currently  Other Topics Concern  . Not on file  Social History Narrative  . Not on file   Social Determinants of Health   Financial Resource Strain: Medium Risk  . Difficulty of Paying Living Expenses: Somewhat hard  Food Insecurity: No Food Insecurity  . Worried About Charity fundraiser in the Last Year: Never true  . Ran Out of Food in the Last Year: Never true  Transportation Needs: No Transportation Needs  . Lack of Transportation (Medical): No  .  Lack of Transportation (Non-Medical): No  Physical Activity: Inactive  . Days of Exercise per Week: 0 days  . Minutes of Exercise per Session: 0 min  Stress: Stress Concern Present  . Feeling of Stress : Rather much  Social Connections: Unknown  . Frequency of Communication with Friends and Family: Three times a week  . Frequency of Social Gatherings with Friends and Family: Never  . Attends Religious Services: Never  . Active Member of Clubs or Organizations: No  . Attends Archivist Meetings: Never  . Marital Status: Not on file  Intimate  Partner Violence: Not At Risk  . Fear of Current or Ex-Partner: No  . Emotionally Abused: No  . Physically Abused: No  . Sexually Abused: No    Outpatient Medications Prior to Visit  Medication Sig Dispense Refill  . albuterol (VENTOLIN HFA) 108 (90 Base) MCG/ACT inhaler Inhale 1 puff into the lungs every 6 (six) hours as needed for wheezing or shortness of breath. 18 g 11  . amLODipine (NORVASC) 10 MG tablet Take 1 tablet (10 mg total) by mouth daily. 90 tablet 2  . calcium carbonate (TUMS - DOSED IN MG ELEMENTAL CALCIUM) 500 MG chewable tablet Chew by mouth.    . citalopram (CELEXA) 20 MG tablet Take 1 tablet (20 mg total) by mouth daily. 56 tablet 0  . cloNIDine (CATAPRES) 0.1 MG tablet TAKE ONE TABLET BY MOUTH AT BEDTIME 56 tablet 0  . clopidogrel (PLAVIX) 75 MG tablet Take 1 tablet (75 mg total) by mouth daily with breakfast. 30 tablet 2  . Fluticasone-Umeclidin-Vilant (TRELEGY ELLIPTA) 100-62.5-25 MCG/INH AEPB Inhale 1 puff into the lungs daily. 28 each 11  . Magnesium 400 MG CAPS Take 400 mg by mouth daily.    Marland Kitchen triamcinolone (KENALOG) 0.025 % ointment Apply 1 application topically 2 (two) times daily. To buttocks 60 g 2  . esomeprazole (NEXIUM) 20 MG capsule Take 1 capsule (20 mg total) by mouth daily at 12 noon. 30 capsule 3  . gabapentin (NEURONTIN) 300 MG capsule Take 1 capsule (300 mg total) by mouth 2 (two) times daily. 60 capsule 2  . glipiZIDE (GLUCOTROL) 5 MG tablet Take 1 tablet (5 mg total) by mouth 2 (two) times daily before a meal. 60 tablet 3  . metFORMIN (GLUCOPHAGE) 1000 MG tablet Take 1 tablet (1,000 mg total) by mouth 2 (two) times daily with a meal. 60 tablet 3  . rosuvastatin (CRESTOR) 5 MG tablet Take 1 tablet (5 mg total) by mouth daily. 30 tablet 3  . Acetaminophen (ARTHRITIS PAIN PO) Take 1,000 mg by mouth daily.    . blood glucose meter kit and supplies KIT Dispense based on patient and insurance preference. Use up to four times daily as directed. (FOR ICD-9  250.00, 250.01). 1 each 0  . oxyCODONE 10 MG TABS Take 1 tablet (10 mg total) by mouth every 4 (four) hours as needed for moderate pain. (Patient not taking: No sig reported) 14 tablet 0   No facility-administered medications prior to visit.    Allergies  Allergen Reactions  . Bee Venom Anaphylaxis  . Coffee Flavor Shortness Of Breath  . Cheese Swelling    Blue Cheese only    ROS Review of Systems  Constitutional: Negative.   Eyes: Negative.   Respiratory: Positive for shortness of breath (with activity).   Cardiovascular: Negative.   Endocrine: Negative.   Neurological: Negative.   Psychiatric/Behavioral: Negative.       Objective:    Physical  Exam HENT:     Head: Normocephalic and atraumatic.  Cardiovascular:     Rate and Rhythm: Normal rate and regular rhythm.     Pulses: Normal pulses.     Heart sounds: Normal heart sounds.  Pulmonary:     Effort: Pulmonary effort is normal.     Breath sounds: Normal breath sounds.  Neurological:     General: No focal deficit present.     Mental Status: He is alert and oriented to person, place, and time. Mental status is at baseline.  Psychiatric:        Mood and Affect: Mood normal.        Behavior: Behavior normal.        Thought Content: Thought content normal.        Judgment: Judgment normal.     BP 121/78 (BP Location: Left Arm, Patient Position: Sitting, Cuff Size: Large)   Pulse 80   Temp 98 F (36.7 C)   Resp 16   Wt 203 lb 9.6 oz (92.4 kg)   SpO2 97%   BMI 29.21 kg/m  Wt Readings from Last 3 Encounters:  06/29/20 203 lb 9.6 oz (92.4 kg)  06/22/20 203 lb (92.1 kg)  05/26/20 203 lb 9.6 oz (92.4 kg)     Health Maintenance Due  Topic Date Due  . OPHTHALMOLOGY EXAM  Never done  . COVID-19 Vaccine (1) Never done  . TETANUS/TDAP  Never done    There are no preventive care reminders to display for this patient.  Lab Results  Component Value Date   TSH 1.720 10/28/2019   Lab Results  Component Value  Date   WBC 4.6 06/22/2020   HGB 14.6 06/22/2020   HCT 41.8 06/22/2020   MCV 96 06/22/2020   PLT 301 06/22/2020   Lab Results  Component Value Date   NA 134 06/22/2020   K 4.3 06/22/2020   CO2 21 06/22/2020   GLUCOSE 202 (H) 06/22/2020   BUN 8 06/22/2020   CREATININE 0.91 06/22/2020   BILITOT 0.3 06/22/2020   ALKPHOS 119 06/22/2020   AST 41 (H) 06/22/2020   ALT 30 06/22/2020   PROT 7.3 06/22/2020   ALBUMIN 4.3 06/22/2020   CALCIUM 9.4 06/22/2020   ANIONGAP 11 03/23/2020   Lab Results  Component Value Date   CHOL 167 06/22/2020   Lab Results  Component Value Date   HDL 44 06/22/2020   Lab Results  Component Value Date   LDLCALC 70 06/22/2020   Lab Results  Component Value Date   TRIG 335 (H) 06/22/2020   Lab Results  Component Value Date   CHOLHDL 3.8 06/22/2020   Lab Results  Component Value Date   HGBA1C 7.7 (H) 06/22/2020      Assessment & Plan:     1. History of gastroesophageal reflux (GERD) - His acid reflux is under control and he will continue on current treatment regimen. . -Avoid spicy, fatty and fried food . Avoid sodas and sour juices . Avoid heavy meals . Avoid eating 4 hours before bedtime . Elevate head of bed at night - esomeprazole (NEXIUM) 20 MG capsule; Take 1 capsule (20 mg total) by mouth daily at 12 noon.  Dispense: 30 capsule; Refill: 3  2. Type 2 diabetes mellitus with diabetic neuropathy, without long-term current use of insulin (HCC) - His diabetes is improving, HgbA1c was 7.7% and his goal should be less than 7%. He was advised to check blood glucose at least bid, record and bring  log to follow up visit. His fasting blood glucose should be between 80-130 mg/dl.  - gabapentin (NEURONTIN) 300 MG capsule; Take 1 capsule (300 mg total) by mouth 3 (three) times daily.  Dispense: 90 capsule; Refill: 2 - glipiZIDE (GLUCOTROL) 5 MG tablet; Take 10 mg in the morning and 5 mg in the evening  Dispense: 90 tablet; Refill: 2 - metFORMIN  (GLUCOPHAGE) 1000 MG tablet; Take 1 tablet (1,000 mg total) by mouth 2 (two) times daily with a meal.  Dispense: 60 tablet; Refill: 3 - HgB A1c; Future  3. Other polyneuropathy -His peripheral neuropathy is not controlled and Gabapentin was increased to 300 mg tid. - gabapentin (NEURONTIN) 300 MG capsule; Take 1 capsule (300 mg total) by mouth 3 (three) times daily.  Dispense: 90 capsule; Refill: 2  4. Elevated lipids - He will continue on current medication, low fat/cholesterol diet. - rosuvastatin (CRESTOR) 5 MG tablet; Take 1 tablet (5 mg total) by mouth daily.  Dispense: 30 tablet; Refill: 3  5. Shortness of breath - He was advised to go to the ED for worsening symptoms and will follow up with - Ambulatory referral to Cardiology  6. Post-traumatic stress disorder, unspecified - He will continue on current treatment regimen and was provided with [name] flyer. He was advised to call the Crisis help line or go to the ED for worsening symptoms.   Follow-up: Return in about 13 weeks (around 09/28/2020), or if symptoms worsen or fail to improve.    Timothy Stagner Jerold Coombe, NP

## 2020-07-01 ENCOUNTER — Telehealth: Payer: Self-pay | Admitting: Family

## 2020-07-01 NOTE — Telephone Encounter (Signed)
Unable to reach this patient in attempt to schedule a new patient appointment after receiving a referral [facility].    Timothy [name], NT

## 2020-07-06 ENCOUNTER — Other Ambulatory Visit: Payer: Self-pay | Admitting: Gerontology

## 2020-07-06 DIAGNOSIS — F4310 Post-traumatic stress disorder, unspecified: Secondary | ICD-10-CM

## 2020-07-07 ENCOUNTER — Ambulatory Visit (HOSPITAL_COMMUNITY): Payer: Self-pay | Admitting: Licensed Clinical Social Worker

## 2020-07-14 ENCOUNTER — Other Ambulatory Visit: Payer: Self-pay | Admitting: Gerontology

## 2020-07-15 NOTE — Progress Notes (Deleted)
   Patient ID: Bradley Bostelman., male    DOB: 11-10-1960, 60 y.o.   MRN: [id number]  HPI  Mr Kestenbaum is a 60 y/o male with a history of  Echo report from 06/16/20 reviewed and showed an EF of 60-65% along with mild LAE.   Has not been admitted or been in the ED in the last 4 months.   He presents today for his initial visit with a chief complaint of  Review of Systems    Physical Exam    Assessment & Plan:  1: Chronic heart failure with preserved ejection fraction along with structural changes (LAE)- - NYHA class - saw pulmonology Jayme Cloud) 05/26/20  2: HTN- - BP - saw PCP @ [facility] Linzie Collin) 06/29/20 - BMP 06/22/20 reviewed and showed sodium 134, potassium 4.3, creatinine 0.91 and GFR 93  3: DM- - saw vascular Manson Passey) 04/22/20 - A1c 06/22/20 was 7.7%  4: Tobacco use-

## 2020-07-18 ENCOUNTER — Telehealth: Payer: Self-pay | Admitting: Family

## 2020-07-18 ENCOUNTER — Ambulatory Visit: Payer: Self-pay | Admitting: Family

## 2020-07-18 NOTE — Telephone Encounter (Signed)
Patient did not show for his [facility] appointment on 07/18/20. Will attempt to reschedule.  

## 2020-07-20 ENCOUNTER — Other Ambulatory Visit (INDEPENDENT_AMBULATORY_CARE_PROVIDER_SITE_OTHER): Payer: Self-pay | Admitting: Nurse Practitioner

## 2020-07-20 DIAGNOSIS — I739 Peripheral vascular disease, unspecified: Secondary | ICD-10-CM

## 2020-07-21 ENCOUNTER — Ambulatory Visit (INDEPENDENT_AMBULATORY_CARE_PROVIDER_SITE_OTHER): Payer: Self-pay | Admitting: Vascular Surgery

## 2020-07-21 ENCOUNTER — Other Ambulatory Visit: Payer: Self-pay

## 2020-07-21 ENCOUNTER — Ambulatory Visit (INDEPENDENT_AMBULATORY_CARE_PROVIDER_SITE_OTHER): Payer: Medicaid Other

## 2020-07-21 DIAGNOSIS — I739 Peripheral vascular disease, unspecified: Secondary | ICD-10-CM | POA: Diagnosis not present

## 2020-07-26 ENCOUNTER — Encounter (INDEPENDENT_AMBULATORY_CARE_PROVIDER_SITE_OTHER): Payer: Self-pay | Admitting: *Deleted

## 2020-07-26 ENCOUNTER — Other Ambulatory Visit (INDEPENDENT_AMBULATORY_CARE_PROVIDER_SITE_OTHER): Payer: Self-pay | Admitting: Vascular Surgery

## 2020-07-26 DIAGNOSIS — I739 Peripheral vascular disease, unspecified: Secondary | ICD-10-CM

## 2020-07-28 ENCOUNTER — Other Ambulatory Visit: Payer: Self-pay

## 2020-07-28 ENCOUNTER — Ambulatory Visit (INDEPENDENT_AMBULATORY_CARE_PROVIDER_SITE_OTHER): Payer: Medicaid Other | Admitting: Pulmonary Disease

## 2020-07-28 ENCOUNTER — Telehealth: Payer: Self-pay | Admitting: Pulmonary Disease

## 2020-07-28 ENCOUNTER — Encounter: Payer: Self-pay | Admitting: Pulmonary Disease

## 2020-07-28 VITALS — BP 126/72 | HR 88 | Temp 97.5°F | Ht 70.0 in | Wt 205.2 lb

## 2020-07-28 DIAGNOSIS — J449 Chronic obstructive pulmonary disease, unspecified: Secondary | ICD-10-CM

## 2020-07-28 DIAGNOSIS — Z9189 Other specified personal risk factors, not elsewhere classified: Secondary | ICD-10-CM | POA: Diagnosis not present

## 2020-07-28 DIAGNOSIS — R0602 Shortness of breath: Secondary | ICD-10-CM

## 2020-07-28 DIAGNOSIS — F17210 Nicotine dependence, cigarettes, uncomplicated: Secondary | ICD-10-CM | POA: Diagnosis not present

## 2020-07-28 MED ORDER — TRELEGY ELLIPTA 200-62.5-25 MCG/INH IN AEPB: 1.0000 | INHALATION_SPRAY | Freq: Every day | RESPIRATORY_TRACT | 0 refills | Status: AC

## 2020-07-28 NOTE — Patient Instructions (Signed)
We will make sure that you have your cardiology referral appointment as you check out today.  We are increasing your Trelegy Ellipta to 200/62.5/25 1 inhalation daily.  Rinse your mouth well after use it.  I recommend that you get a sleep study.  We will forward this note to [facility] so they can get this scheduled for you.  We will see him in follow-up in 3 months time call sooner should any new problems arise.

## 2020-07-28 NOTE — Telephone Encounter (Signed)
Patient seen in office today. Dr. Jayme Cloud recommended sleep study and referral to cardiology.  A referral was placed to cardiology by [facility], however the department selected was heart failure clinic. Referral needs to be placed to [facility] heart care.  I have spoken to [facility] with [facility] in reference to this. Connye Burkitt is unsure if sleep [facility] has to be ordered by [facility] or if our office can order it. She will discuss this with her supervisor and call back with an update.  She will correct cardiology referral.  Will hold message in triage to ensure follow up.

## 2020-07-28 NOTE — Progress Notes (Signed)
Subjective:    Patient ID: Timothy Main., male    DOB: 1960-08-17, 60 y.o.   MRN: [id number]  Chief Complaint  Patient presents with  . Follow-up    PFT 06/16/2020--C/o sob with exertion and rest, prod cough with tan to white sputum and wheezing with laying flat or on side.    HPI This is a 60 year old current smoker (2 PPD) who presents for follow-up of his initial evaluation here on 26 May 2020 for the issue of shortness of breath.  He unfortunately has increased his cigarette use to 2 packs of cigarettes per day.  He underwent pulmonary function testing and 2D echo on 16 June 2020.  We have been unable to reach him to relay results.  He notes that Trelegy Ellipta helps him some but is not quite "strong enough".  He has not had any recent fevers, chills or sweats.  Cough is productive of whitish to grayish sputum, this is chronic.  No hemoptysis.  No chest pain, no lower extremity edema or calf tenderness.  He does have prior amputation of the left great toe due to osteomyelitis.  He has terrible sleep hygiene, flow-volume loop that suggest potential sleep apnea by fluttering of the expiratory loop.  He sleeps alone so is not aware of snoring or apneic episodes.  He does not endorse any other symptomatology.  DATA: 06/16/2020 PFT: FEV1 1.54 L or 42% predicted, FVC 2.54 L or 52% predicted, FEV1/FVC 61%, there is bronchodilator response with net change of 14% on FEV1 postbronchodilator.  There is concomitant restrictive physiology.  There is fluttering of the flow-volume loop consistent with possible OSA.  Diffusion capacity moderately reduced. 06/17/2019 one 2D echo: LVEF 60 to 65%, mildly dilated LA, no diastolic dysfunction.  Review of Systems A 10 point review of systems was performed and it is as noted above otherwise negative.  Patient Active Problem List   Diagnosis Date Noted  . Osteomyelitis ([place]) 03/15/2020  . Open wound of foot 01/20/2020  . Elevated lipids  01/20/2020  . Personal history of colonic polyps   . Shortness of breath 11/04/2019  . Encounter to establish care 10/15/2019  . Essential hypertension 10/15/2019  . Controlled diabetes mellitus type 2 with complications ([place]) 00/76/2263  . Smoking 10/15/2019  . Peripheral neuropathy 10/15/2019  . Cramp, abdominal 10/15/2019  . History of hepatitis 10/15/2019  . History of gastroesophageal reflux (GERD) 10/15/2019  . Type 2 diabetes mellitus with diabetic neuropathy, unspecified ([place]) 10/15/2019  . Post-traumatic stress disorder, unspecified 07/14/2019  . Antisocial personality disorder ([place]) 07/14/2019   Allergies  Allergen Reactions  . Bee Venom Anaphylaxis  . Coffee Flavor Shortness Of Breath  . Cheese Swelling    Blue Cheese only   Current Meds  Medication Sig  . Acetaminophen (ARTHRITIS PAIN PO) Take 1,000 mg by mouth daily.  Marland Kitchen albuterol (VENTOLIN HFA) 108 (90 Base) MCG/ACT inhaler Inhale 1 puff into the lungs every 6 (six) hours as needed for wheezing or shortness of breath.  Marland Kitchen amLODipine (NORVASC) 10 MG tablet Take 1 tablet (10 mg total) by mouth daily.  . blood glucose meter kit and supplies KIT Dispense based on patient and insurance preference. Use up to four times daily as directed. (FOR ICD-9 250.00, 250.01).  . calcium carbonate (TUMS - DOSED IN MG ELEMENTAL CALCIUM) 500 MG chewable tablet Chew by mouth.  . citalopram (CELEXA) 20 MG tablet TAKE ONE TABLET BY MOUTH EVERY DAY  . cloNIDine (CATAPRES) 0.1 MG tablet TAKE  ONE TABLET BY MOUTH AT BEDTIME  . clopidogrel (PLAVIX) 75 MG tablet Take 1 tablet (75 mg total) by mouth daily with breakfast.  . esomeprazole (NEXIUM) 20 MG capsule Take 1 capsule (20 mg total) by mouth daily at 12 noon.  . Fluticasone-Umeclidin-Vilant (TRELEGY ELLIPTA) 100-62.5-25 MCG/INH AEPB Inhale 1 puff into the lungs daily.  Marland Kitchen gabapentin (NEURONTIN) 300 MG capsule Take 1 capsule (300 mg total) by mouth 3 (three) times daily.  Marland Kitchen glipiZIDE (GLUCOTROL)  5 MG tablet Take 10 mg in the morning and 5 mg in the evening  . Magnesium 400 MG CAPS Take 400 mg by mouth daily.  . metFORMIN (GLUCOPHAGE) 1000 MG tablet Take 1 tablet (1,000 mg total) by mouth 2 (two) times daily with a meal.  . rosuvastatin (CRESTOR) 5 MG tablet Take 1 tablet (5 mg total) by mouth daily.  Marland Kitchen triamcinolone (KENALOG) 0.025 % ointment Apply 1 application topically 2 (two) times daily. To buttocks   Social History   Tobacco Use  . Smoking status: Current Every Day Smoker    Packs/day: 2.00    Years: 41.00    Pack years: 82.00    Types: Cigarettes  . Smokeless tobacco: Never Used  . Tobacco comment: 1PPD 07/28/2020  Substance Use Topics  . Alcohol use: Yes    Alcohol/week: 6.0 standard drinks    Types: 6 Cans of beer per week    Comment: occasionally     There is no immunization history on file for this patient.  We discussed Covid-19 precautions.  I reviewed the vaccine effectiveness and potential side effects in detail to include differences between mRNA vaccines and traditional vaccines (attenuated virus).  Discussion also offered of long-term effectiveness and safety profile which are unclear at this time.  Discussed current CDC guidance that all patients are recommended COVID-19 vaccinations -as long as they do not have allergy to components of the vaccine.     Objective:   Physical Exam BP 126/72 (BP Location: Left Arm, Cuff Size: Normal)   Pulse 88   Temp (!) 97.5 F (36.4 C) (Temporal)   Ht '5\' 10"'  (1.778 m)   Wt 205 lb 3.2 oz (93.1 kg)   SpO2 98%   BMI 29.44 kg/m   GENERAL: Well-developed well-nourished gentleman in no acute distress.  Fully ambulatory. HEAD: Normocephalic, atraumatic.  EYES: Pupils equal, round, reactive to light.  No scleral icterus. Injected conjunctiva. MOUTH: Nose/mouth/throat not examined due to masking requirements for COVID 19. NECK: Supple. No thyromegaly. Trachea midline. No JVD.  No adenopathy. PULMONARY: Good air entry  symmetrically bilaterally.    Some rhonchi noted, no wheezes. CARDIOVASCULAR: S1 and S2. Regular rate and rhythm.  No rubs, murmurs or gallops heard. ABDOMEN: Benign. MUSCULOSKELETAL: No clubbing, no edema.  Left foot in boot, status post amputation of the great toe due to osteomyelitis. NEUROLOGIC: No focal deficit, speech is fluent, SKIN: Intact,warm,dry. PSYCH: Flat affect, normal behavior.  Discussed the the PFT and results of 2D echo with the patient.     Assessment & Plan:     ICD-10-CM   1. 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 Shortness of breath  R06.02    Improved with Trelegy To quit smoking Undergoing cardiac eval  3. Tobacco dependence due to cigarettes  F17.210    Patient was counseled regards discontinuation of smoking Total counseling time 3 to 5 minutes  4. At risk  for sleep apnea  Z91.89    Suggested by flow volume loop Patient's history also suggestive   Meds ordered this encounter  Medications  . Fluticasone-Umeclidin-Vilant (TRELEGY ELLIPTA) 200-62.5-25 MCG/INH AEPB    Sig: Inhale 1 puff into the lungs daily for 1 day.    Dispense:  14 each    Refill:  0    Order Specific Question:   Lot Number?    Answer:   [id number]    Order Specific Question:   Expiration Date?    Answer:   06/18/2021    Order Specific Question:   Manufacturer?    Answer:   [employer] [[id number]]    Order Specific Question:   Quantity    Answer:   1   [facility].  Patient's Trelegy Ellipta has been increased to 200/62.5/25, 1 inhalation daily.  We will see him in follow-up in 3 months time call sooner should any new problems arise.  Renold Don, MD [facility]   *This note was dictated using voice recognition software/Dragon.  Despite best efforts to proofread, errors can occur which can change the meaning.  Any change was  purely unintentional.

## 2020-07-29 NOTE — Telephone Encounter (Signed)
[facility]. Unable to leave a voicemail, as the call disconnects after the recording.  Will call back.

## 2020-08-01 NOTE — Telephone Encounter (Signed)
[facility] for [facility].

## 2020-08-02 NOTE — Telephone Encounter (Signed)
[facility] called back and stated pt needed both appts w/ heart care and the heart failure clinic. Can be reached at [phone].

## 2020-08-02 NOTE — Telephone Encounter (Signed)
Spoke to [place] with [facility], who stated that she would place a referral to [facility] heart care and order sleep study.  Will close encounter, as nothing further is needed at this time.

## 2020-08-02 NOTE — Telephone Encounter (Signed)
It appears that patient is scheduled for Heart failure clinic on 08/03/20 and cardiology on 08/08/2020.  I have spoken to [facility], who stated that she left a message for RN to return our call to confirm which clinic patient should be seen at.

## 2020-08-03 ENCOUNTER — Other Ambulatory Visit: Payer: Self-pay | Admitting: Gerontology

## 2020-08-03 ENCOUNTER — Other Ambulatory Visit: Payer: Self-pay

## 2020-08-03 ENCOUNTER — Encounter: Payer: Self-pay | Admitting: Family

## 2020-08-03 ENCOUNTER — Ambulatory Visit: Payer: Medicaid Other | Attending: Family | Admitting: Family

## 2020-08-03 VITALS — BP 156/91 | HR 92 | Resp 18 | Ht 70.0 in | Wt 205.5 lb

## 2020-08-03 DIAGNOSIS — I110 Hypertensive heart disease with heart failure: Secondary | ICD-10-CM | POA: Diagnosis not present

## 2020-08-03 DIAGNOSIS — Z79899 Other long term (current) drug therapy: Secondary | ICD-10-CM | POA: Diagnosis not present

## 2020-08-03 DIAGNOSIS — E119 Type 2 diabetes mellitus without complications: Secondary | ICD-10-CM | POA: Diagnosis not present

## 2020-08-03 DIAGNOSIS — I10 Essential (primary) hypertension: Secondary | ICD-10-CM

## 2020-08-03 DIAGNOSIS — I5032 Chronic diastolic (congestive) heart failure: Secondary | ICD-10-CM | POA: Insufficient documentation

## 2020-08-03 DIAGNOSIS — E7800 Pure hypercholesterolemia, unspecified: Secondary | ICD-10-CM | POA: Diagnosis not present

## 2020-08-03 DIAGNOSIS — Z7902 Long term (current) use of antithrombotics/antiplatelets: Secondary | ICD-10-CM | POA: Diagnosis not present

## 2020-08-03 DIAGNOSIS — E785 Hyperlipidemia, unspecified: Secondary | ICD-10-CM | POA: Insufficient documentation

## 2020-08-03 DIAGNOSIS — Z7984 Long term (current) use of oral hypoglycemic drugs: Secondary | ICD-10-CM | POA: Insufficient documentation

## 2020-08-03 DIAGNOSIS — Z8679 Personal history of other diseases of the circulatory system: Secondary | ICD-10-CM

## 2020-08-03 DIAGNOSIS — F17210 Nicotine dependence, cigarettes, uncomplicated: Secondary | ICD-10-CM | POA: Diagnosis not present

## 2020-08-03 DIAGNOSIS — E1140 Type 2 diabetes mellitus with diabetic neuropathy, unspecified: Secondary | ICD-10-CM

## 2020-08-03 DIAGNOSIS — Z720 Tobacco use: Secondary | ICD-10-CM

## 2020-08-03 MED ORDER — SACUBITRIL-VALSARTAN 24-26 MG PO TABS: 1.0000 | ORAL_TABLET | Freq: Two times a day (BID) | ORAL | 3 refills | Status: DC

## 2020-08-03 NOTE — Progress Notes (Signed)
Patient ID: Timothy Waibel., male    DOB: 1960-09-01, 60 y.o.   MRN: [id number]  HPI  Timothy [name] is a 60 y/o male with a history of DM, hyperlipidemia, HTN, current tobacco use and chronic heart failure.   Echo report from 06/16/20 reviewed and showed an EF of 60-65% along with mild LAE.   Has not been admitted or been in the ED in the last 4 months.   Timothy [name] presents today for his initial visit with a chief complaint of moderate shortness of breath with little exertion. Says that this has been present for several months and Timothy [name] occasionally feels short of breath even at rest. Timothy [name] has associated fatigue, cough, chest pain, pedal edema, palpitations, abdominal distention, light-headedness, depression, anxiety and difficulty sleeping along with this. Timothy [name] denies any abdominal pain or change in appetite.   Timothy [name] does not have any scales to weigh himself.   Past Medical History:  Diagnosis Date  . CHF (congestive heart failure) ([place])   . Diabetes mellitus without complication ([place])   . Hypercholesteremia   . Hypertension    Past Surgical History:  Procedure Laterality Date  . AMPUTATION Left 03/22/2020   Procedure: AMPUTATION RAY-1st Ray Left Foot;  Surgeon: [name], [name];  Location: [facility] ORS;  Service: Podiatry;  Laterality: Left;  . AMPUTATION TOE Left 03/16/2020   Procedure: AMPUTATION GREAT TOE;  Surgeon: [name], [name];  Location: [facility] ORS;  Service: Podiatry;  Laterality: Left;  . COLONOSCOPY WITH PROPOFOL N/A 12/22/2019   Procedure: COLONOSCOPY WITH PROPOFOL;  Surgeon: [name], MD;  Location: [facility] ENDOSCOPY;  Service: Endoscopy;  Laterality: N/A;  . LOWER EXTREMITY ANGIOGRAPHY Left 03/18/2020   Procedure: Lower Extremity Angiography;  Surgeon: [name], MD;  Location: [facility] LAB;  Service: Cardiovascular;  Laterality: Left;  . TONSILLECTOMY    . WISDOM TOOTH EXTRACTION     Family History  Family history unknown: Yes   Social History   Tobacco Use  .  Smoking status: Current Every Day Smoker    Packs/day: 2.00    Years: 41.00    Pack years: 82.00    Types: Cigarettes  . Smokeless tobacco: Never Used  . Tobacco comment: 1PPD 07/28/2020  Substance Use Topics  . Alcohol use: Yes    Alcohol/week: 6.0 standard drinks    Types: 6 Cans of beer per week    Comment: occasionally    Allergies  Allergen Reactions  . Bee Venom Anaphylaxis  . Coffee Flavor Shortness Of Breath  . Cheese Swelling    Blue Cheese only   Prior to Admission medications   Medication Sig Start Date End Date Taking? Authorizing Provider  Acetaminophen (ARTHRITIS PAIN PO) Take 1,000 mg by mouth daily.   Yes [provider]  albuterol (VENTOLIN HFA) 108 (90 Base) MCG/ACT inhaler Inhale 1 puff into the lungs every 6 (six) hours as needed for wheezing or shortness of breath. 05/26/20  Yes Tyler Pita, MD  amLODipine (NORVASC) 10 MG tablet Take 1 tablet (10 mg total) by mouth daily. 05/25/20  Yes Tukov-Yual, Arlyss Gandy, NP  blood glucose meter kit and supplies KIT Dispense based on patient and insurance preference. Use up to four times daily as directed. (FOR ICD-9 250.00, 250.01). 10/15/19  Yes Iloabachie, Chioma E, NP  calcium carbonate (TUMS - DOSED IN MG ELEMENTAL CALCIUM) 500 MG chewable tablet Chew by mouth.   Yes [provider]  citalopram (CELEXA) 20 MG tablet TAKE ONE TABLET BY  MOUTH EVERY DAY 07/13/20  Yes Iloabachie, Chioma E, NP  cloNIDine (CATAPRES) 0.1 MG tablet TAKE ONE TABLET BY MOUTH AT BEDTIME 06/07/20  Yes Iloabachie, Chioma E, NP  clopidogrel (PLAVIX) 75 MG tablet Take 1 tablet (75 mg total) by mouth daily with breakfast. 04/20/20  Yes Iloabachie, Chioma E, NP  esomeprazole (NEXIUM) 20 MG capsule Take 1 capsule (20 mg total) by mouth daily at 12 noon. 06/29/20  Yes Iloabachie, Chioma E, NP  gabapentin (NEURONTIN) 300 MG capsule Take 1 capsule (300 mg total) by mouth 3 (three) times daily. 06/29/20  Yes Iloabachie, Chioma E, NP   glipiZIDE (GLUCOTROL) 5 MG tablet Take 10 mg in the morning and 5 mg in the evening 06/29/20  Yes Iloabachie, Chioma E, NP  Magnesium 400 MG CAPS Take 400 mg by mouth daily.   Yes [provider]  metFORMIN (GLUCOPHAGE) 1000 MG tablet Take 1 tablet (1,000 mg total) by mouth 2 (two) times daily with a meal. 06/29/20 06/29/21 Yes Iloabachie, Chioma E, NP  rosuvastatin (CRESTOR) 5 MG tablet Take 1 tablet (5 mg total) by mouth daily. 06/29/20  Yes Iloabachie, Chioma E, NP  triamcinolone (KENALOG) 0.025 % ointment Apply 1 application topically 2 (two) times daily. To buttocks 05/25/20  Yes Tukov-Yual, Arlyss Gandy, NP    Review of Systems  Constitutional: Positive for fatigue. Negative for appetite change.  HENT: Negative for congestion, postnasal drip and sore throat.   Eyes: Negative.   Respiratory: Positive for cough (productive) and shortness of breath (even at rest).   Cardiovascular: Positive for chest pain, palpitations and leg swelling.  Gastrointestinal: Positive for abdominal distention. Negative for abdominal pain.  Endocrine: Negative.   Genitourinary: Negative.   Musculoskeletal: Positive for arthralgias (left shoulder worse when hanging arm down). Negative for back pain.  Skin: Negative.   Allergic/Immunologic: Negative.   Neurological: Positive for light-headedness (with position changes). Negative for dizziness.  Hematological: Negative for adenopathy. Does not bruise/bleed easily.  Psychiatric/Behavioral: Positive for dysphoric mood and sleep disturbance (due to pain on left side of abdomen/ chest). The patient is nervous/anxious.    Vitals:   08/03/20 1402  BP: (!) 156/91  Pulse: 92  Resp: 18  SpO2: 96%  Weight: 205 lb 8 oz (93.2 kg)  Height: 5' 10" (1.778 m)   Wt Readings from Last 3 Encounters:  08/03/20 205 lb 8 oz (93.2 kg)  07/28/20 205 lb 3.2 oz (93.1 kg)  06/29/20 203 lb 9.6 oz (92.4 kg)   Lab Results  Component Value Date   CREATININE 0.91 06/22/2020    CREATININE 0.87 03/23/2020   CREATININE 0.75 03/22/2020    Physical Exam Vitals and nursing note reviewed.  Constitutional:      Appearance: Timothy [name] is well-developed.  HENT:     Head: Normocephalic and atraumatic.  Neck:     Vascular: No JVD.  Cardiovascular:     Rate and Rhythm: Normal rate and regular rhythm.  Pulmonary:     Effort: Pulmonary effort is normal. No respiratory distress.     Breath sounds: No wheezing or rales.  Abdominal:     Palpations: Abdomen is soft.     Tenderness: There is no abdominal tenderness.  Musculoskeletal:     Cervical back: Neck supple.     Right lower leg: No tenderness. Edema (1+ pitting) present.     Left lower leg: No tenderness. Edema (1+ pitting) present.  Skin:    General: Skin is warm and dry.  Neurological:  General: No focal deficit present.     Mental Status: Timothy [name] is alert and oriented to person, place, and time.  Psychiatric:        Mood and Affect: Mood normal.        Behavior: Behavior normal.    Assessment & Plan:  1: Chronic heart failure with preserved ejection fraction along with structural changes (LAE)- - NYHA class III - euvolemic today - scales given to him today; instructed to weigh himself every morning after using the bathroom, write the weight down and call for an overnight weight gain of > 2 pounds or a weekly weight gain of > 5 pounds - trying to follow a low sodium diet; reviewed the importance of reading food labels for sodium content and looking at serving sizes - will add entresto 24/85m BID; 30 day voucher given to him today and [facility]  - will check BMP next visit - elevate legs when sitting for long periods of time - saw pulmonology (Patsey Berthold 07/28/20 - sees cardiology ([place]) 08/08/20  2: HTN- - BP mildly elevated; adding entresto per above - [facility] (Hattie Perch 06/29/20 - BMP 06/22/20 reviewed and showed sodium 134, potassium 4.3,  creatinine 0.91 and GFR 93  3: DM- - saw vascular ([place] 04/22/20 - A1c 06/22/20 was 7.7% - glucose at home today was 306  4: Tobacco use- - smokes 1 ppd of cigarettes and doesn't express a desire to quit - drinks 2 of the 40 ounce cans of beer on the weekends - complete cessation of both discussed for 3 minutes with him   Patient did not bring his medications nor a list. Each medication was verbally reviewed with the patient and Timothy [name] was encouraged to bring the bottles to every visit to confirm accuracy of list.  Return in 1 month or sooner for any questions/problems before then.

## 2020-08-03 NOTE — Patient Instructions (Addendum)
Begin weighing daily and call for an overnight weight gain of > 2 pounds or a weekly weight gain of >5 pounds. 

## 2020-08-04 ENCOUNTER — Encounter: Payer: Self-pay | Admitting: Family

## 2020-08-04 MED ORDER — SACUBITRIL-VALSARTAN 24-26 MG PO TABS: 1.0000 | ORAL_TABLET | Freq: Two times a day (BID) | ORAL | 3 refills | Status: DC

## 2020-08-08 ENCOUNTER — Encounter: Payer: Self-pay | Admitting: Cardiology

## 2020-08-08 ENCOUNTER — Other Ambulatory Visit: Payer: Self-pay | Admitting: Cardiology

## 2020-08-08 ENCOUNTER — Other Ambulatory Visit: Payer: Self-pay

## 2020-08-08 ENCOUNTER — Ambulatory Visit (INDEPENDENT_AMBULATORY_CARE_PROVIDER_SITE_OTHER): Payer: Medicaid Other | Admitting: Cardiology

## 2020-08-08 ENCOUNTER — Telehealth: Payer: Self-pay | Admitting: Pharmacist

## 2020-08-08 VITALS — BP 98/64 | HR 89 | Ht 70.0 in | Wt 208.0 lb

## 2020-08-08 DIAGNOSIS — I10 Essential (primary) hypertension: Secondary | ICD-10-CM

## 2020-08-08 DIAGNOSIS — I2510 Atherosclerotic heart disease of native coronary artery without angina pectoris: Secondary | ICD-10-CM | POA: Diagnosis not present

## 2020-08-08 DIAGNOSIS — F17200 Nicotine dependence, unspecified, uncomplicated: Secondary | ICD-10-CM

## 2020-08-08 DIAGNOSIS — R0600 Dyspnea, unspecified: Secondary | ICD-10-CM | POA: Diagnosis not present

## 2020-08-08 DIAGNOSIS — R0602 Shortness of breath: Secondary | ICD-10-CM | POA: Diagnosis not present

## 2020-08-08 MED ORDER — AMLODIPINE BESYLATE 5 MG PO TABS: 5.0000 mg | ORAL_TABLET | Freq: Every day | ORAL | 1 refills | Status: DC

## 2020-08-08 NOTE — Telephone Encounter (Signed)
08/08/2020 3:31:57 PM - Trelegy Ellipta refilled online with [facility]  -- Rhetta Mura - Monday, August 08, 2020 3:30 PM -- Placed refill online with [facility] for Trelegy Ellipta [phone], order# [id number], allow 10-14 business days to receive.

## 2020-08-08 NOTE — Progress Notes (Signed)
Cardiology Office Note:    Date:  08/08/2020   ID:  Timothy [name]., DOB Sep 25, 1960, MRN [id number]  PCP:  Langston Reusing, NP   [place]  Cardiologist:  No primary care provider on file.  Advanced Practice Provider:  No care team member to display Electrophysiologist:  None       Referring MD: Langston Reusing, NP   Chief Complaint  Patient presents with  . New Patient (Initial Visit)    Establish care with provider for shortness of breath. Medications verbally reviewed with patient, however patient is not sure what medications he is currently taking.     History of Present Illness:    Timothy [name]. is a 60 y.o. male with a hx of hypertension, diabetes, hyperlipidemia, COPD, current smoker x40+ years presenting due to shortness of breath.  Echocardiogram obtained 05/2020 showed normal systolic and diastolic function, EF 60 to 65%.  PFTs 05/2020 showed moderate to severe COPD.  Coronary artery calcifications noted on prior CT chest for lung cancer screening.  He has shortness of breath with exertion which has gotten worse over the past 3 months or so.  Symptoms are sometimes associated with minimal chest discomfort.  Takes Norvasc, clonidine, Entresto.  Placed on clonidine by his psychiatrist because it also helps him sleep.  Past Medical History:  Diagnosis Date  . CHF (congestive heart failure) ([place])   . Diabetes mellitus without complication ([place])   . Hypercholesteremia   . Hypertension     Past Surgical History:  Procedure Laterality Date  . AMPUTATION Left 03/22/2020   Procedure: AMPUTATION RAY-1st Ray Left Foot;  Surgeon: [name], [name];  Location: [facility] ORS;  Service: Podiatry;  Laterality: Left;  . AMPUTATION TOE Left 03/16/2020   Procedure: AMPUTATION GREAT TOE;  Surgeon: [name], [name];  Location: [facility] ORS;  Service: Podiatry;  Laterality: Left;  . COLONOSCOPY WITH PROPOFOL N/A 12/22/2019   Procedure: COLONOSCOPY  WITH PROPOFOL;  Surgeon: [name], MD;  Location: [facility];  Service: Endoscopy;  Laterality: N/A;  . LOWER EXTREMITY ANGIOGRAPHY Left 03/18/2020   Procedure: Lower Extremity Angiography;  Surgeon: [name], MD;  Location: [facility];  Service: Cardiovascular;  Laterality: Left;  . TONSILLECTOMY    . WISDOM TOOTH EXTRACTION      Current Medications: Current Meds  Medication Sig  . Acetaminophen (ARTHRITIS PAIN PO) Take 1,000 mg by mouth daily.  Marland Kitchen albuterol (VENTOLIN HFA) 108 (90 Base) MCG/ACT inhaler Inhale 1 puff into the lungs every 6 (six) hours as needed for wheezing or shortness of breath.  Marland Kitchen amLODipine (NORVASC) 5 MG tablet Take 1 tablet (5 mg total) by mouth daily.  . blood glucose meter kit and supplies KIT Dispense based on patient and insurance preference. Use up to four times daily as directed. (FOR ICD-9 250.00, 250.01).  . calcium carbonate (TUMS - DOSED IN MG ELEMENTAL CALCIUM) 500 MG chewable tablet Chew by mouth.  . citalopram (CELEXA) 20 MG tablet TAKE ONE TABLET BY MOUTH EVERY DAY  . cloNIDine (CATAPRES) 0.1 MG tablet TAKE ONE TABLET BY MOUTH AT BEDTIME  . clopidogrel (PLAVIX) 75 MG tablet Take 1 tablet (75 mg total) by mouth daily with breakfast.  . esomeprazole (NEXIUM) 20 MG capsule Take 1 capsule (20 mg total) by mouth daily at 12 noon.  . gabapentin (NEURONTIN) 300 MG capsule Take 1 capsule (300 mg total) by mouth 3 (three) times daily.  Marland Kitchen glipiZIDE (GLUCOTROL) 5 MG  tablet Take 10 mg in the morning and 5 mg in the evening  . Magnesium 400 MG CAPS Take 400 mg by mouth daily.  . metFORMIN (GLUCOPHAGE) 1000 MG tablet Take 1 tablet (1,000 mg total) by mouth 2 (two) times daily with a meal.  . rosuvastatin (CRESTOR) 5 MG tablet Take 1 tablet (5 mg total) by mouth daily.  . sacubitril-valsartan (ENTRESTO) 24-26 MG Take 1 tablet by mouth 2 (two) times daily.  . sacubitril-valsartan (ENTRESTO) 24-26 MG Take 1 tablet by mouth 2 (two) times daily.   Marland Kitchen triamcinolone (KENALOG) 0.025 % ointment Apply 1 application topically 2 (two) times daily. To buttocks  . [DISCONTINUED] amLODipine (NORVASC) 10 MG tablet Take 1 tablet (10 mg total) by mouth daily.     Allergies:   Bee venom, Coffee flavor, and Cheese   Social History   Socioeconomic History  . Marital status: Single    Spouse name: Not on file  . Number of children: 0  . Years of education: Not on file  . Highest education level: Not on file  Occupational History  . Occupation: Unemployed  Tobacco Use  . Smoking status: Current Every Day Smoker    Packs/day: 2.00    Years: 41.00    Pack years: 82.00    Types: Cigarettes  . Smokeless tobacco: Never Used  . Tobacco comment: 1PPD 07/28/2020  Vaping Use  . Vaping Use: Never used  Substance and Sexual Activity  . Alcohol use: Yes    Alcohol/week: 6.0 standard drinks    Types: 6 Cans of beer per week    Comment: occasionally   . Drug use: Not Currently  . Sexual activity: Not Currently  Other Topics Concern  . Not on file  Social History Narrative  . Not on file   Social Determinants of Health   Financial Resource Strain: Medium Risk  . Difficulty of Paying Living Expenses: Somewhat hard  Food Insecurity: No Food Insecurity  . Worried About Charity fundraiser in the Last Year: Never true  . Ran Out of Food in the Last Year: Never true  Transportation Needs: No Transportation Needs  . Lack of Transportation (Medical): No  . Lack of Transportation (Non-Medical): No  Physical Activity: Inactive  . Days of Exercise per Week: 0 days  . Minutes of Exercise per Session: 0 min  Stress: Stress Concern Present  . Feeling of Stress : Rather much  Social Connections: Unknown  . Frequency of Communication with Friends and Family: Three times a week  . Frequency of Social Gatherings with Friends and Family: Never  . Attends Religious Services: Never  . Active Member of Clubs or Organizations: No  . Attends Theatre manager Meetings: Never  . Marital Status: Not on file     Family History: The patient's Family history is unknown by patient.  ROS:   Please see the history of present illness.     All other systems reviewed and are negative.  EKGs/Labs/Other Studies Reviewed:    The following studies were reviewed today:   EKG:  EKG is  ordered today.  The ekg ordered today demonstrates normal sinus rhythm, normal EKG  Recent Labs: 10/28/2019: TSH 1.720 06/22/2020: ALT 30; BUN 8; Creatinine, Ser 0.91; Hemoglobin 14.6; Platelets 301; Potassium 4.3; Sodium 134  Recent Lipid Panel    Component Value Date/Time   CHOL 167 06/22/2020 1058   TRIG 335 (H) 06/22/2020 1058   HDL 44 06/22/2020 1058   CHOLHDL 3.8 06/22/2020  [place] 70 06/22/2020 1058     Risk Assessment/Calculations:      Physical Exam:    VS:  BP 98/64 (BP Location: Right Arm, Patient Position: Sitting, Cuff Size: Normal)   Pulse 89   Ht '5\' 10"'  (1.778 m)   Wt 208 lb (94.3 kg)   SpO2 (!) 87%   BMI 29.84 kg/m     Wt Readings from Last 3 Encounters:  08/08/20 208 lb (94.3 kg)  08/03/20 205 lb 8 oz (93.2 kg)  07/28/20 205 lb 3.2 oz (93.1 kg)     GEN:  Well nourished, well developed in no acute distress HEENT: Normal NECK: No JVD; No carotid bruits LYMPHATICS: No lymphadenopathy CARDIAC: RRR, no murmurs, rubs, gallops RESPIRATORY: Decreased breath sounds at bases, no wheezing ABDOMEN: Soft, non-tender, non-distended MUSCULOSKELETAL:  No edema; No deformity  SKIN: Warm and dry NEUROLOGIC:  Alert and oriented x 3 PSYCHIATRIC:  Normal affect   ASSESSMENT:    1. Dyspnea, unspecified type   2. Coronary artery disease involving native coronary artery of native heart without angina pectoris   3. Primary hypertension   4. Smoking   5. SOB (shortness of breath)    PLAN:    In order of problems listed above:  1. Shortness of breath, likely secondary to COPD.  Coronary artery calcifications noted on  screening chest CT scan.  Obtain Lexiscan Myoview to evaluate presence of ischemia.  Last echo with normal systolic and diastolic function. 2. CAD/coronary calcifications.  Risk factors of hypertension, current smoker.  Myoview as above.  LDL at goal.  Continue Plavix and statin as prescribed. 3. Hypertension, BP low.  Patient's EF is preserved, do not think there is much benefit from using Entresto in light of preserved ejection fraction especially due to cost.  Okay to switch to less costly ARB such as losartan as per primary care physician.  Decrease Norvasc to 5 mg daily.  If BP stays low, consider stopping altogether. 4. Current smoker, cessation advised.  Follow-up after Myoview.   Shared Decision Making/Informed Consent The risks [chest pain, shortness of breath, cardiac arrhythmias, dizziness, blood pressure fluctuations, myocardial infarction, stroke/transient ischemic attack, nausea, vomiting, allergic reaction, radiation exposure, metallic taste sensation and life-threatening complications (estimated to be 1 in 10,000)], benefits (risk stratification, diagnosing coronary artery disease, treatment guidance) and alternatives of a nuclear stress test were discussed in detail with Mr. Lowrey and he agrees to proceed.   Medication Adjustments/Labs and Tests Ordered: Current medicines are reviewed at length with the patient today.  Concerns regarding medicines are outlined above.  Orders Placed This Encounter  Procedures  . NM Myocar Multi W/Spect W/Wall Motion / EF  . EKG 12-Lead   Meds ordered this encounter  Medications  . amLODipine (NORVASC) 5 MG tablet    Sig: Take 1 tablet (5 mg total) by mouth daily.    Dispense:  90 tablet    Refill:  1    Patient Instructions  Medication Instructions:  Your physician has recommended you make the following change in your medication:  1- DECREASE Amlodipine to 5 mg by mouth once a day.  *If you need a refill on your cardiac medications  before your next appointment, please call your pharmacy*  Lab Work: none If you have labs (blood work) drawn today and your tests are completely normal, you will receive your results only by: Marland Kitchen MyChart Message (if you have MyChart) OR . A paper copy in the mail If you have  any lab test that is abnormal or we need to change your treatment, we will call you to review the results.  Testing/Procedures:  LEXISCAN Stress Test [facility] MYOVIEW  Your caregiver has ordered a Stress Test with nuclear imaging. The purpose of this test is to evaluate the blood supply to your heart muscle. This procedure is referred to as a "Non-Invasive Stress Test." This is because other than having an IV started in your vein, nothing is inserted or "invades" your body. Cardiac stress tests are done to find areas of poor blood flow to the heart by determining the extent of coronary artery disease (CAD). Some patients exercise on a treadmill, which naturally increases the blood flow to your heart, while others who are  unable to walk on a treadmill due to physical limitations have a pharmacologic/chemical stress agent called Lexiscan . This medicine will mimic walking on a treadmill by temporarily increasing your coronary blood flow.   Please note: these test may take anywhere between 2-4 hours to complete  PLEASE REPORT TO [place] AT THE FIRST DESK WILL DIRECT YOU WHERE TO GO  Date of Procedure:_____________________________________  Arrival Time for Procedure:______________________________  Instructions regarding medication:   _xx_ : Hold diabetes medication morning of procedure - Metformin, Glipizide   PLEASE NOTIFY THE OFFICE AT LEAST 24 HOURS IN ADVANCE IF YOU ARE UNABLE TO KEEP YOUR APPOINTMENT.  [phone] AND  PLEASE NOTIFY NUCLEAR MEDICINE AT [facility] AT LEAST 24 HOURS IN ADVANCE IF YOU ARE UNABLE TO KEEP YOUR APPOINTMENT. [phone]  How to prepare for your Myoview  test:  5. Do not eat or drink after midnight 6. No caffeine for 24 hours prior to test 7. No smoking 24 hours prior to test. 8. Your medication may be taken with water.  If your doctor stopped a medication because of this test, do not take that medication. 9. Ladies, please do not wear dresses.  Skirts or pants are appropriate. Please wear a short sleeve shirt. 10. No perfume, cologne or lotion. 11. Wear comfortable walking shoes. No heels!   Follow-Up: At [facility], you and your health needs are our priority.  As part of our continuing mission to provide you with exceptional heart care, we have created designated Provider Care Teams.  These Care Teams include your primary Cardiologist (physician) and Advanced Practice Providers (APPs -  Physician Assistants and Nurse Practitioners) who all work together to provide you with the care you need, when you need it.  We recommend signing up for the patient portal called "MyChart".  Sign up information is provided on this After Visit Summary.  MyChart is used to connect with patients for Virtual Visits (Telemedicine).  Patients are able to view lab/test results, encounter notes, upcoming appointments, etc.  Non-urgent messages can be sent to your provider as well.   To learn more about what you can do with MyChart, go to [email or web address].    Your next appointment:   After stress test.  The format for your next appointment:   In Person  Provider:   You may see Dr. Garen Lah or one of the following Advanced Practice Providers on your designated Care Team:    Murray Hodgkins, NP  Christell Faith, PA-C  Marrianne Mood, PA-C  Cadence Kathlen Mody, [place]  Laurann Montana, NP   Cardiac Nuclear Scan A cardiac nuclear scan is a test that measures blood flow to the heart when a person is resting and when he or she is exercising.  The test looks for problems such as:  Not enough blood reaching a portion of the heart.  The heart muscle not  working normally. You may need this test if:  You have heart disease.  You have had abnormal lab results.  You have had heart surgery or a balloon procedure to open up blocked arteries (angioplasty).  You have chest pain.  You have shortness of breath. In this test, a radioactive dye (tracer) is injected into your bloodstream. After the tracer has traveled to your heart, an imaging device is used to measure how much of the tracer is absorbed by or distributed to various areas of your heart. This procedure is usually done at a hospital and takes 2-4 hours. Tell a health care provider about:  Any allergies you have.  All medicines you are taking, including vitamins, herbs, eye drops, creams, and over-the-counter medicines.  Any problems you or family members have had with anesthetic medicines.  Any blood disorders you have.  Any surgeries you have had.  Any medical conditions you have.  Whether you are pregnant or may be pregnant. What are the risks? Generally, this is a safe procedure. However, problems may occur, including:  Serious chest pain and heart attack. This is only a risk if the stress portion of the test is done.  Rapid heartbeat.  Sensation of warmth in your chest. This usually passes quickly.  Allergic reaction to the tracer. What happens before the procedure?  Ask your health care provider about changing or stopping your regular medicines. This is especially important if you are taking diabetes medicines or blood thinners.  Follow instructions from your health care provider about eating or drinking restrictions.  Remove your jewelry on the day of the procedure. What happens during the procedure?  An IV will be inserted into one of your veins.  Your health care provider will inject a small amount of radioactive tracer through the IV.  You will wait for 20-40 minutes while the tracer travels through your bloodstream.  Your heart activity will be  monitored with an electrocardiogram (ECG).  You will lie down on an exam table.  Images of your heart will be taken for about 15-20 minutes.  You may also have a stress test. For this test, one of the following may be done: ? You will exercise on a treadmill or stationary bike. While you exercise, your heart's activity will be monitored with an ECG, and your blood pressure will be checked. ? You will be given medicines that will increase blood flow to parts of your heart. This is done if you are unable to exercise.  When blood flow to your heart has peaked, a tracer will again be injected through the IV.  After 20-40 minutes, you will get back on the exam table and have more images taken of your heart.  Depending on the type of tracer used, scans may need to be repeated 3-4 hours later.  Your IV line will be removed when the procedure is over. The procedure may vary among health care providers and hospitals. What happens after the procedure?  Unless your health care provider tells you otherwise, you may return to your normal schedule, including diet, activities, and medicines.  Unless your health care provider tells you otherwise, you may increase your fluid intake. This will help to flush the contrast dye from your body. Drink enough fluid to keep your urine pale yellow.  Ask your health care provider, or the department that is  doing the test: ? When will my results be ready? ? How will I get my results? Summary  A cardiac nuclear scan measures the blood flow to the heart when a person is resting and when he or she is exercising.  Tell your health care provider if you are pregnant.  Before the procedure, ask your health care provider about changing or stopping your regular medicines. This is especially important if you are taking diabetes medicines or blood thinners.  After the procedure, unless your health care provider tells you otherwise, increase your fluid intake. This will  help flush the contrast dye from your body.  After the procedure, unless your health care provider tells you otherwise, you may return to your normal schedule, including diet, activities, and medicines. This information is not intended to replace advice given to you by your health care provider. Make sure you discuss any questions you have with your health care provider. Document Revised: 11/18/2017 Document Reviewed: 11/18/2017 Elsevier Patient Education  2021 [place].     Signed, Kate Sable, MD  08/08/2020 4:57 PM    [place]

## 2020-08-08 NOTE — Patient Instructions (Signed)
Medication Instructions:  Your physician has recommended you make the following change in your medication:  1- DECREASE Amlodipine to 5 mg by mouth once a day.  *If you need a refill on your cardiac medications before your next appointment, please call your pharmacy*  Lab Work: none If you have labs (blood work) drawn today and your tests are completely normal, you will receive your results only by: Marland Kitchen MyChart Message (if you have MyChart) OR . A paper copy in the mail If you have any lab test that is abnormal or we need to change your treatment, we will call you to review the results.  Testing/Procedures:  LEXISCAN Stress Test [facility] MYOVIEW  Your caregiver has ordered a Stress Test with nuclear imaging. The purpose of this test is to evaluate the blood supply to your heart muscle. This procedure is referred to as a "Non-Invasive Stress Test." This is because other than having an IV started in your vein, nothing is inserted or "invades" your body. Cardiac stress tests are done to find areas of poor blood flow to the heart by determining the extent of coronary artery disease (CAD). Some patients exercise on a treadmill, which naturally increases the blood flow to your heart, while others who are  unable to walk on a treadmill due to physical limitations have a pharmacologic/chemical stress agent called Lexiscan . This medicine will mimic walking on a treadmill by temporarily increasing your coronary blood flow.   Please note: these test may take anywhere between 2-4 hours to complete  PLEASE REPORT TO [facility] MEDICAL MALL ENTRANCE  THE VOLUNTEERS AT THE FIRST DESK WILL DIRECT YOU WHERE TO GO  Date of Procedure:_____________________________________  Arrival Time for Procedure:______________________________  Instructions regarding medication:   _xx_ : Hold diabetes medication morning of procedure - Metformin, Glipizide   PLEASE NOTIFY THE OFFICE AT LEAST 24 HOURS IN ADVANCE IF YOU ARE  UNABLE TO KEEP YOUR APPOINTMENT.  [phone] AND  PLEASE NOTIFY NUCLEAR MEDICINE AT [facility] AT LEAST 24 HOURS IN ADVANCE IF YOU ARE UNABLE TO KEEP YOUR APPOINTMENT. [phone]  How to prepare for your Myoview test:  1. Do not eat or drink after midnight 2. No caffeine for 24 hours prior to test 3. No smoking 24 hours prior to test. 4. Your medication may be taken with water.  If your doctor stopped a medication because of this test, do not take that medication. 5. Ladies, please do not wear dresses.  Skirts or pants are appropriate. Please wear a short sleeve shirt. 6. No perfume, cologne or lotion. 7. Wear comfortable walking shoes. No heels!   Follow-Up: At [facility], you and your health needs are our priority.  As part of our continuing mission to provide you with exceptional heart care, we have created designated Provider Care Teams.  These Care Teams include your primary Cardiologist (physician) and Advanced Practice Providers (APPs -  Physician Assistants and Nurse Practitioners) who all work together to provide you with the care you need, when you need it.  We recommend signing up for the patient portal called "MyChart".  Sign up information is provided on this After Visit Summary.  MyChart is used to connect with patients for Virtual Visits (Telemedicine).  Patients are able to view lab/test results, encounter notes, upcoming appointments, etc.  Non-urgent messages can be sent to your provider as well.   To learn more about what you can do with MyChart, go to [email or web address].    Your next appointment:   After  stress test.  The format for your next appointment:   In Person  Provider:   You may see Dr. Azucena Cecil or one of the following Advanced Practice Providers on your designated Care Team:    Nicolasa Ducking, NP  Eula Listen, PA-C  Marisue Ivan, PA-C  Cadence Fransico Michael, [place]  Gillian Shields, NP   Cardiac Nuclear Scan A cardiac nuclear scan is a test  that measures blood flow to the heart when a person is resting and when he or she is exercising. The test looks for problems such as:  Not enough blood reaching a portion of the heart.  The heart muscle not working normally. You may need this test if:  You have heart disease.  You have had abnormal lab results.  You have had heart surgery or a balloon procedure to open up blocked arteries (angioplasty).  You have chest pain.  You have shortness of breath. In this test, a radioactive dye (tracer) is injected into your bloodstream. After the tracer has traveled to your heart, an imaging device is used to measure how much of the tracer is absorbed by or distributed to various areas of your heart. This procedure is usually done at a hospital and takes 2-4 hours. Tell a health care provider about:  Any allergies you have.  All medicines you are taking, including vitamins, herbs, eye drops, creams, and over-the-counter medicines.  Any problems you or family members have had with anesthetic medicines.  Any blood disorders you have.  Any surgeries you have had.  Any medical conditions you have.  Whether you are pregnant or may be pregnant. What are the risks? Generally, this is a safe procedure. However, problems may occur, including:  Serious chest pain and heart attack. This is only a risk if the stress portion of the test is done.  Rapid heartbeat.  Sensation of warmth in your chest. This usually passes quickly.  Allergic reaction to the tracer. What happens before the procedure?  Ask your health care provider about changing or stopping your regular medicines. This is especially important if you are taking diabetes medicines or blood thinners.  Follow instructions from your health care provider about eating or drinking restrictions.  Remove your jewelry on the day of the procedure. What happens during the procedure?  An IV will be inserted into one of your veins.  Your  health care provider will inject a small amount of radioactive tracer through the IV.  You will wait for 20-40 minutes while the tracer travels through your bloodstream.  Your heart activity will be monitored with an electrocardiogram (ECG).  You will lie down on an exam table.  Images of your heart will be taken for about 15-20 minutes.  You may also have a stress test. For this test, one of the following may be done: ? You will exercise on a treadmill or stationary bike. While you exercise, your heart's activity will be monitored with an ECG, and your blood pressure will be checked. ? You will be given medicines that will increase blood flow to parts of your heart. This is done if you are unable to exercise.  When blood flow to your heart has peaked, a tracer will again be injected through the IV.  After 20-40 minutes, you will get back on the exam table and have more images taken of your heart.  Depending on the type of tracer used, scans may need to be repeated 3-4 hours later.  Your IV line will be  removed when the procedure is over. The procedure may vary among health care providers and hospitals. What happens after the procedure?  Unless your health care provider tells you otherwise, you may return to your normal schedule, including diet, activities, and medicines.  Unless your health care provider tells you otherwise, you may increase your fluid intake. This will help to flush the contrast dye from your body. Drink enough fluid to keep your urine pale yellow.  Ask your health care provider, or the department that is doing the test: ? When will my results be ready? ? How will I get my results? Summary  A cardiac nuclear scan measures the blood flow to the heart when a person is resting and when he or she is exercising.  Tell your health care provider if you are pregnant.  Before the procedure, ask your health care provider about changing or stopping your regular medicines.  This is especially important if you are taking diabetes medicines or blood thinners.  After the procedure, unless your health care provider tells you otherwise, increase your fluid intake. This will help flush the contrast dye from your body.  After the procedure, unless your health care provider tells you otherwise, you may return to your normal schedule, including diet, activities, and medicines. This information is not intended to replace advice given to you by your health care provider. Make sure you discuss any questions you have with your health care provider. Document Revised: 11/18/2017 Document Reviewed: 11/18/2017 Elsevier Patient Education  2021 [employer].

## 2020-08-09 ENCOUNTER — Telehealth: Payer: Self-pay | Admitting: Cardiovascular Disease

## 2020-08-09 NOTE — Telephone Encounter (Signed)
Attempted to call pt, no VM to LM. Cannot call Ethelene Browns (friend), there is no DPR approval. Mr. Timothy [name] was seen yesterday in clinic by Dr. Azucena Cecil for shob, lexiscan order placed and decrease amlodipine to 5 mg daily. Will route to [facility]'s nurse as Lorain Childes. Nothing further at this time.

## 2020-08-09 NOTE — Telephone Encounter (Signed)
Patient's "friend " called and states that patient is getting information from 2 different cardiologist. Patient's "friend" Ethelene Browns) states that patient is homeless and was kicked out from the house across the street . Ethelene Browns states he cannot breath and has heart failure.

## 2020-08-10 ENCOUNTER — Encounter: Payer: Self-pay | Admitting: Emergency Medicine

## 2020-08-10 ENCOUNTER — Emergency Department: Payer: Medicaid Other

## 2020-08-10 ENCOUNTER — Observation Stay
Admission: EM | Admit: 2020-08-10 | Discharge: 2020-08-11 | DRG: 177 | Disposition: A | Discharge status: Other Acute Inpatient Hospital | Payer: Medicaid Other | Attending: Family Medicine | Admitting: Family Medicine

## 2020-08-10 ENCOUNTER — Other Ambulatory Visit: Payer: Self-pay

## 2020-08-10 DIAGNOSIS — K219 Gastro-esophageal reflux disease without esophagitis: Secondary | ICD-10-CM | POA: Diagnosis not present

## 2020-08-10 DIAGNOSIS — Z89422 Acquired absence of other left toe(s): Secondary | ICD-10-CM

## 2020-08-10 DIAGNOSIS — J869 Pyothorax without fistula: Principal | ICD-10-CM | POA: Diagnosis present

## 2020-08-10 DIAGNOSIS — J9601 Acute respiratory failure with hypoxia: Secondary | ICD-10-CM | POA: Diagnosis not present

## 2020-08-10 DIAGNOSIS — I110 Hypertensive heart disease with heart failure: Secondary | ICD-10-CM | POA: Diagnosis present

## 2020-08-10 DIAGNOSIS — Z86010 Personal history of colonic polyps: Secondary | ICD-10-CM | POA: Diagnosis not present

## 2020-08-10 DIAGNOSIS — Z7984 Long term (current) use of oral hypoglycemic drugs: Secondary | ICD-10-CM

## 2020-08-10 DIAGNOSIS — E1140 Type 2 diabetes mellitus with diabetic neuropathy, unspecified: Secondary | ICD-10-CM | POA: Diagnosis not present

## 2020-08-10 DIAGNOSIS — Z7902 Long term (current) use of antithrombotics/antiplatelets: Secondary | ICD-10-CM | POA: Diagnosis not present

## 2020-08-10 DIAGNOSIS — Z79899 Other long term (current) drug therapy: Secondary | ICD-10-CM | POA: Diagnosis not present

## 2020-08-10 DIAGNOSIS — I509 Heart failure, unspecified: Secondary | ICD-10-CM | POA: Diagnosis not present

## 2020-08-10 DIAGNOSIS — E871 Hypo-osmolality and hyponatremia: Secondary | ICD-10-CM | POA: Diagnosis present

## 2020-08-10 DIAGNOSIS — Z20822 Contact with and (suspected) exposure to covid-19: Secondary | ICD-10-CM | POA: Diagnosis not present

## 2020-08-10 DIAGNOSIS — D72829 Elevated white blood cell count, unspecified: Secondary | ICD-10-CM | POA: Diagnosis present

## 2020-08-10 LAB — COMPREHENSIVE METABOLIC PANEL
ALT: 18 U/L (ref 0–44)
AST: 17 U/L (ref 15–41)
Albumin: 2.6 g/dL — ABNORMAL LOW (ref 3.5–5.0)
Alkaline Phosphatase: 139 U/L — ABNORMAL HIGH (ref 38–126)
Anion gap: 15 (ref 5–15)
BUN: 14 mg/dL (ref 6–20)
CO2: 24 mmol/L (ref 22–32)
Calcium: 8.6 mg/dL — ABNORMAL LOW (ref 8.9–10.3)
Chloride: 87 mmol/L — ABNORMAL LOW (ref 98–111)
Creatinine, Ser: 0.9 mg/dL (ref 0.61–1.24)
GFR, Estimated: >60 mL/min (ref >60)
Glucose, Bld: 243 mg/dL — ABNORMAL HIGH (ref 70–99)
Potassium: 4.4 mmol/L (ref 3.5–5.1)
Sodium: 126 mmol/L — ABNORMAL LOW (ref 135–145)
Total Bilirubin: 1.4 mg/dL — ABNORMAL HIGH (ref 0.3–1.2)
Total Protein: 7.4 g/dL (ref 6.5–8.1)

## 2020-08-10 LAB — CBC WITH DIFFERENTIAL/PLATELET
Abs Immature Granulocytes: 0.14 10*3/uL — ABNORMAL HIGH (ref 0.00–0.07)
Basophils Absolute: 0 10*3/uL (ref 0.0–0.1)
Basophils Relative: 0 %
Eosinophils Absolute: 0 10*3/uL (ref 0.0–0.5)
Eosinophils Relative: 0 %
HCT: 35.4 % — ABNORMAL LOW (ref 39.0–52.0)
Hemoglobin: 11.9 g/dL — ABNORMAL LOW (ref 13.0–17.0)
Immature Granulocytes: 1 %
Lymphocytes Relative: 7 %
Lymphs Abs: 1 10*3/uL (ref 0.7–4.0)
MCH: 32.1 pg (ref 26.0–34.0)
MCHC: 33.6 g/dL (ref 30.0–36.0)
MCV: 95.4 fL (ref 80.0–100.0)
Monocytes Absolute: 1.2 10*3/uL — ABNORMAL HIGH (ref 0.1–1.0)
Monocytes Relative: 9 %
Neutro Abs: 11.2 10*3/uL — ABNORMAL HIGH (ref 1.7–7.7)
Neutrophils Relative %: 83 %
Platelets: 427 10*3/uL — ABNORMAL HIGH (ref 150–400)
RBC: 3.71 MIL/uL — ABNORMAL LOW (ref 4.22–5.81)
RDW: 12.9 % (ref 11.5–15.5)
WBC: 13.5 10*3/uL — ABNORMAL HIGH (ref 4.0–10.5)
nRBC: 0 % (ref 0.0–0.2)

## 2020-08-10 LAB — LACTIC ACID, PLASMA
Lactic Acid, Venous: 1.1 mmol/L (ref 0.5–1.9)
Lactic Acid, Venous: 1.1 mmol/L (ref 0.5–1.9)

## 2020-08-10 LAB — BRAIN NATRIURETIC PEPTIDE: B Natriuretic Peptide: 222.7 pg/mL — ABNORMAL HIGH (ref 0.0–100.0)

## 2020-08-10 LAB — RESP PANEL BY RT-PCR (FLU A&B, COVID) ARPGX2
Influenza A by PCR: NEGATIVE
Influenza B by PCR: NEGATIVE
SARS Coronavirus 2 by RT PCR: NEGATIVE

## 2020-08-10 LAB — D-DIMER, QUANTITATIVE: D-Dimer, Quant: 6.34 ug/mL-FEU — ABNORMAL HIGH (ref 0.00–0.50)

## 2020-08-10 LAB — POC SARS CORONAVIRUS 2 AG -  ED: SARS Coronavirus 2 Ag: NEGATIVE

## 2020-08-10 LAB — TROPONIN I (HIGH SENSITIVITY)
Troponin I (High Sensitivity): 10 ng/L (ref <18)
Troponin I (High Sensitivity): 12 ng/L (ref <18)

## 2020-08-10 LAB — PROCALCITONIN: Procalcitonin: 0.48 ng/mL

## 2020-08-10 MED ORDER — METHYLPREDNISOLONE SODIUM SUCC 125 MG IJ SOLR
125.0000 mg | Freq: Once | INTRAMUSCULAR | Status: AC
  Administered 2020-08-10: 125 mg via INTRAVENOUS
  Filled 2020-08-10: qty 2

## 2020-08-10 MED ORDER — PIPERACILLIN-TAZOBACTAM 3.375 G IVPB 30 MIN
3.3750 g | Freq: Once | INTRAVENOUS | Status: AC
  Administered 2020-08-10: 3.375 g via INTRAVENOUS
  Filled 2020-08-10: qty 50

## 2020-08-10 MED ORDER — IOHEXOL 350 MG/ML SOLN
100.0000 mL | Freq: Once | INTRAVENOUS | Status: AC | PRN
  Administered 2020-08-10: 100 mL via INTRAVENOUS

## 2020-08-10 MED ORDER — SODIUM CHLORIDE 0.9 % IV BOLUS
1000.0000 mL | Freq: Once | INTRAVENOUS | Status: AC
  Administered 2020-08-10: 1000 mL via INTRAVENOUS

## 2020-08-10 MED ORDER — SODIUM CHLORIDE 0.9 % IV SOLN
500.0000 mg | Freq: Once | INTRAVENOUS | Status: DC
  Filled 2020-08-10: qty 500

## 2020-08-10 MED ORDER — IPRATROPIUM-ALBUTEROL 0.5-2.5 (3) MG/3ML IN SOLN
3.0000 mL | Freq: Once | RESPIRATORY_TRACT | Status: AC
  Administered 2020-08-10: 3 mL via RESPIRATORY_TRACT
  Filled 2020-08-10: qty 3

## 2020-08-10 MED ORDER — SODIUM CHLORIDE 0.9 % IV SOLN
2.0000 g | Freq: Once | INTRAVENOUS | Status: DC
  Filled 2020-08-10: qty 20

## 2020-08-10 MED ORDER — ACETAMINOPHEN 325 MG PO TABS
650.0000 mg | ORAL_TABLET | Freq: Once | ORAL | Status: AC
  Administered 2020-08-10: 650 mg via ORAL
  Filled 2020-08-10: qty 2

## 2020-08-10 NOTE — ED Triage Notes (Signed)
Pt to ED via EMS. Per EMS, Pt c/o of SOB. Pt has no hx of COPD. Pt was placed on 6L nasal cannula 98-100%. If pt moves he drops down to 85%. Pt HR 100 NSR. Pt is a/o x 4. Pt is now on 3L at 97%. Pt is complaining of chest pain. a/o 4

## 2020-08-10 NOTE — ED Provider Notes (Signed)
[facility] Emergency Department Provider Note  ____________________________________________   Event Date/Time   First MD Initiated Contact with Patient 08/10/20 1857     (approximate)  I have reviewed the triage vital signs and the nursing notes.   HISTORY  Chief Complaint Shortness of Breath    HPI Timothy [name]. is a 60 y.o. male with history of hypertension, CHF, diabetes, here with shortness of breath.  The patient states that for the last 8 days, he has had cough, shortness of breath, and weakness.  He has had intermittent chills but no known fevers until he had a fever today.  He said significant sputum production with yellow and occasionally blood-tinged sputum.  He has had decreased appetite.  He has had significant weakness.  States he has been essentially unable to really walk up stairs for the last several days.  He has some aching, throbbing, left-sided chest pain, that is also pleuritic.  This actually was the first symptom he noticed 8 days ago.  Denies any trauma.        Past Medical History:  Diagnosis Date   CHF (congestive heart failure) ([place])    Diabetes mellitus without complication ([place])    Hypercholesteremia    Hypertension     Patient Active Problem List   Diagnosis Date Noted   Empyema ([place]) 08/10/2020   Osteomyelitis ([place]) 03/15/2020   Open wound of foot 01/20/2020   Elevated lipids 01/20/2020   Personal history of colonic polyps    Shortness of breath 11/04/2019   Encounter to establish care 10/15/2019   Essential hypertension 10/15/2019   Controlled diabetes mellitus type 2 with complications ([place]) 33/54/5625   Smoking 10/15/2019   Peripheral neuropathy 10/15/2019   Cramp, abdominal 10/15/2019   History of hepatitis 10/15/2019   History of gastroesophageal reflux (GERD) 10/15/2019   Type 2 diabetes mellitus with diabetic neuropathy, unspecified ([place]) 10/15/2019   Post-traumatic stress  disorder, unspecified 07/14/2019   Antisocial personality disorder ([place]) 07/14/2019    Past Surgical History:  Procedure Laterality Date   AMPUTATION Left 03/22/2020   Procedure: AMPUTATION RAY-1st Ray Left Foot;  Surgeon: [name], [name];  Location: [facility] ORS;  Service: Podiatry;  Laterality: Left;   AMPUTATION TOE Left 03/16/2020   Procedure: AMPUTATION GREAT TOE;  Surgeon: [name], [name];  Location: [facility] ORS;  Service: Podiatry;  Laterality: Left;   COLONOSCOPY WITH PROPOFOL N/A 12/22/2019   Procedure: COLONOSCOPY WITH PROPOFOL;  Surgeon: [name], MD;  Location: [facility] ENDOSCOPY;  Service: Endoscopy;  Laterality: N/A;   LOWER EXTREMITY ANGIOGRAPHY Left 03/18/2020   Procedure: Lower Extremity Angiography;  Surgeon: [name], MD;  Location: [place] [facility] LAB;  Service: Cardiovascular;  Laterality: Left;   TONSILLECTOMY     WISDOM TOOTH EXTRACTION      Prior to Admission medications   Medication Sig Start Date End Date Taking? Authorizing Provider  Acetaminophen (ARTHRITIS PAIN PO) Take 1,000 mg by mouth daily.    [provider]  albuterol (VENTOLIN HFA) 108 (90 Base) MCG/ACT inhaler Inhale 1 puff into the lungs every 6 (six) hours as needed for wheezing or shortness of breath. 05/26/20   Tyler Pita, MD  amLODipine (NORVASC) 5 MG tablet Take 1 tablet (5 mg total) by mouth daily. 08/08/20 11/06/20  Kate Sable, MD  blood glucose meter kit and supplies KIT Dispense based on patient and insurance preference. Use up to four times daily as directed. (FOR ICD-9 250.00, 250.01). 10/15/19   Iloabachie,  Chioma E, NP  calcium carbonate (TUMS - DOSED IN MG ELEMENTAL CALCIUM) 500 MG chewable tablet Chew by mouth.    [provider]  citalopram (CELEXA) 20 MG tablet TAKE ONE TABLET BY MOUTH EVERY DAY 07/13/20   Iloabachie, Chioma E, NP  cloNIDine (CATAPRES) 0.1 MG tablet TAKE ONE TABLET BY MOUTH AT BEDTIME 06/07/20   Iloabachie, Chioma E, NP  clopidogrel  (PLAVIX) 75 MG tablet Take 1 tablet (75 mg total) by mouth daily with breakfast. 04/20/20   Iloabachie, Chioma E, NP  esomeprazole (NEXIUM) 20 MG capsule Take 1 capsule (20 mg total) by mouth daily at 12 noon. 06/29/20   Iloabachie, Chioma E, NP  gabapentin (NEURONTIN) 300 MG capsule Take 1 capsule (300 mg total) by mouth 3 (three) times daily. 06/29/20   Iloabachie, Chioma E, NP  glipiZIDE (GLUCOTROL) 5 MG tablet Take 10 mg in the morning and 5 mg in the evening 06/29/20   Iloabachie, Chioma E, NP  Magnesium 400 MG CAPS Take 400 mg by mouth daily.    [provider]  metFORMIN (GLUCOPHAGE) 1000 MG tablet Take 1 tablet (1,000 mg total) by mouth 2 (two) times daily with a meal. 06/29/20 06/29/21  Iloabachie, Chioma E, NP  rosuvastatin (CRESTOR) 5 MG tablet Take 1 tablet (5 mg total) by mouth daily. 06/29/20   Iloabachie, Chioma E, NP  sacubitril-valsartan (ENTRESTO) 24-26 MG Take 1 tablet by mouth 2 (two) times daily. 08/03/20   Alisa Graff, FNP  sacubitril-valsartan (ENTRESTO) 24-26 MG Take 1 tablet by mouth 2 (two) times daily. 08/04/20   Alisa Graff, FNP  triamcinolone (KENALOG) 0.025 % ointment Apply 1 application topically 2 (two) times daily. To buttocks 05/25/20   Tukov-Yual, Arlyss Gandy, NP    Allergies Bee venom, Coffee flavor, and Cheese  Family History  Family history unknown: Yes    Social History Social History   Tobacco Use   Smoking status: Current Every Day Smoker    Packs/day: 2.00    Years: 41.00    Pack years: 82.00    Types: Cigarettes   Smokeless tobacco: Never Used   Tobacco comment: 1PPD 07/28/2020  Vaping Use   Vaping Use: Never used  Substance Use Topics   Alcohol use: Yes    Alcohol/week: 6.0 standard drinks    Types: 6 Cans of beer per week    Comment: occasionally    Drug use: Not Currently    Review of Systems  Review of Systems  Constitutional: Positive for fatigue. Negative for chills and fever.  HENT: Negative for sore throat.    Respiratory: Positive for cough and shortness of breath.   Cardiovascular: Negative for chest pain.  Gastrointestinal: Negative for abdominal pain.  Genitourinary: Negative for flank pain.  Musculoskeletal: Negative for neck pain.  Skin: Negative for rash and wound.  Allergic/Immunologic: Negative for immunocompromised state.  Neurological: Positive for weakness. Negative for numbness.  Hematological: Does not bruise/bleed easily.  All other systems reviewed and are negative.    ____________________________________________  PHYSICAL EXAM:      VITAL SIGNS: ED Triage Vitals  Enc Vitals Group     BP 08/10/20 1840 140/82     Pulse Rate 08/10/20 1840 99     Resp 08/10/20 1840 (!) 30     Temp 08/10/20 1840 (!) 101 F (38.3 C)     Temp Source 08/10/20 1840 Axillary     SpO2 08/10/20 1840 97 %     Weight 08/10/20 1841 212 lb (96.2 kg)  Height 08/10/20 1841 _0  (1.778 m)     Head Circumference --      Peak Flow --      Pain Score 08/10/20 1841 4     Pain Loc --      Pain Edu? --      Excl. in [place]? --      Physical Exam Vitals and nursing note reviewed.  Constitutional:      General: He is not in acute distress.    Appearance: He is well-developed.  HENT:     Head: Normocephalic and atraumatic.  Eyes:     Conjunctiva/sclera: Conjunctivae normal.  Cardiovascular:     Rate and Rhythm: Normal rate and regular rhythm.     Heart sounds: Normal heart sounds. No murmur heard. No friction rub.  Pulmonary:     Effort: Pulmonary effort is normal. No respiratory distress.     Breath sounds: Normal breath sounds. No wheezing or rales.  Abdominal:     General: There is no distension.     Palpations: Abdomen is soft.     Tenderness: There is no abdominal tenderness.  Musculoskeletal:     Cervical back: Neck supple.  Skin:    General: Skin is warm.     Capillary Refill: Capillary refill takes less than 2 seconds.  Neurological:     Mental Status: He is alert and oriented  to person, place, and time.     Motor: No abnormal muscle tone.       ____________________________________________   LABS (all labs ordered are listed, but only abnormal results are displayed)  Labs Reviewed  CBC WITH DIFFERENTIAL/PLATELET - Abnormal; Notable for the following components:      Result Value   WBC 13.5 (*)    RBC 3.71 (*)    Hemoglobin 11.9 (*)    HCT 35.4 (*)    Platelets 427 (*)    Neutro Abs 11.2 (*)    Monocytes Absolute 1.2 (*)    Abs Immature Granulocytes 0.14 (*)    All other components within normal limits  COMPREHENSIVE METABOLIC PANEL - Abnormal; Notable for the following components:   Sodium 126 (*)    Chloride 87 (*)    Glucose, Bld 243 (*)    Calcium 8.6 (*)    Albumin 2.6 (*)    Alkaline Phosphatase 139 (*)    Total Bilirubin 1.4 (*)    All other components within normal limits  BRAIN NATRIURETIC PEPTIDE - Abnormal; Notable for the following components:   Timothy Natriuretic Peptide 222.7 (*)    All other components within normal limits  D-DIMER, QUANTITATIVE - Abnormal; Notable for the following components:   D-Dimer, Quant 6.34 (*)    All other components within normal limits  RESP PANEL BY RT-PCR (FLU A&Timothy, COVID) ARPGX2  CULTURE, BLOOD (SINGLE)  CULTURE, BLOOD (SINGLE)  LACTIC ACID, PLASMA  LACTIC ACID, PLASMA  PROCALCITONIN  POC SARS CORONAVIRUS 2 AG -  ED  TROPONIN I (HIGH SENSITIVITY)  TROPONIN I (HIGH SENSITIVITY)    ____________________________________________  EKG: Sinus tachycardia, VR 101. QRS 94, QTc 448. No acute ST elevation or depressions. No ischemia or infarct. ________________________________________  RADIOLOGY All imaging, including plain films, CT scans, and ultrasounds, independently reviewed by me, and interpretations confirmed via formal radiology reads.  ED MD interpretation:   CXR: Large L pleural effusion/fluid with consolidation CT Angio Chest: Loculated pleural fluid concerning for empyema, no PE  Official  radiology report(s): CT Angio Chest PE W and/or Wo  Contrast  Result Date: 08/10/2020 CLINICAL DATA:  Positive D-dimer. Poor oxygen saturation. Abnormal chest radiography. EXAM: CT ANGIOGRAPHY CHEST WITH CONTRAST TECHNIQUE: Multidetector CT imaging of the chest was performed using the standard protocol during bolus administration of intravenous contrast. Multiplanar CT image reconstructions and MIPs were obtained to evaluate the vascular anatomy. CONTRAST:  159m OMNIPAQUE IOHEXOL 350 MG/ML SOLN COMPARISON:  Chest radiography same day.  CT 11/03/2019 FINDINGS: Cardiovascular: Heart size is normal. No pericardial fluid. There is some coronary artery calcification. There is aortic atherosclerotic calcification. Pulmonary arterial opacification is good. There are no pulmonary emboli. Mediastinum/Nodes: Mild reactive nodal prominence in the mediastinum. No dominant node. Lungs/Pleura: Right chest is clear. No infiltrate, effusion or collapse. On the left, there is a large amount of pleural fluid with multiple loculations worrisome for empyema. There is considerable volume loss in the adjacent lung, particularly affecting the left lower lobe. Upper Abdomen: No significant upper abdominal finding. Musculoskeletal: Ordinary mild thoracic degenerative changes. Review of the MIP images confirms the above findings. IMPRESSION: 1. No pulmonary emboli. 2. Large amount of pleural fluid on the left with multiple loculations worrisome for empyema. Volume loss in the adjacent lung, particularly affecting the left lower lobe. 3. Aortic atherosclerosis. Coronary artery calcification. Aortic Atherosclerosis (ICD10-I70.0). Electronically Signed   By: MNelson ChimesM.D.   On: 08/10/2020 20:18   DG Chest Portable 1 View  Result Date: 08/10/2020 CLINICAL DATA:  Short of breath.  Chest pain. EXAM: PORTABLE CHEST 1 VIEW COMPARISON:  03/15/2020 FINDINGS: There is a moderate to large left pleural effusion with significantly diminished  aeration to the left lower and left mid lung. Veil like opacification of the left upper lung is also noted. Right lung appears clear. Visualized osseous structures are unremarkable. IMPRESSION: Moderate to large left pleural effusion with significantly diminished aeration to the left lower and left mid lung. This is new when compared with the previous exam. Electronically Signed   By: TKerby MoorsM.D.   On: 08/10/2020 19:58    ____________________________________________  PROCEDURES   Procedure(s) performed (including Critical Care):  .Critical Care Performed by: IDuffy Bruce MD Authorized by: IDuffy Bruce MD   Critical care provider statement:    Critical care time (minutes):  35   Critical care time was exclusive of:  Separately billable procedures and treating other patients and teaching time   Critical care was necessary to treat or prevent imminent or life-threatening deterioration of the following conditions:  Cardiac failure, circulatory failure and respiratory failure   Critical care was time spent personally by me on the following activities:  Development of treatment plan with patient or surrogate, discussions with consultants, evaluation of patient's response to treatment, examination of patient, obtaining history from patient or surrogate, ordering and performing treatments and interventions, ordering and review of laboratory studies, ordering and review of radiographic studies, pulse oximetry, re-evaluation of patient's condition and review of old charts   I assumed direction of critical care for this patient from another provider in my specialty: no   .1-3 Lead EKG Interpretation Performed by: IDuffy Bruce MD Authorized by: IDuffy Bruce MD     Interpretation: normal     ECG rate:  90-100   ECG rate assessment: normal     Rhythm: sinus rhythm     Ectopy: none     Conduction: normal   Comments:     Indication: resp  failure    ____________________________________________  INITIAL IMPRESSION / MDM / ASSESSMENT AND PLAN / ED COURSE  As part of my medical decision making, I reviewed the following data within the [place] notes reviewed and incorporated, Old chart reviewed, Notes from prior ED visits, and [place] Controlled Substance [place] Timothy Monahan Jr. was evaluated in Emergency Department on 08/11/2020 for the symptoms described in the history of present illness. He was evaluated in the context of the global COVID-19 pandemic, which necessitated consideration that the patient might be at risk for infection with the SARS-CoV-2 virus that causes COVID-19. Institutional protocols and algorithms that pertain to the evaluation of patients at risk for COVID-19 are in a state of rapid change based on information released by regulatory bodies including the CDC and federal and state organizations. These policies and algorithms were followed during the patient's care in the ED.  Some ED evaluations and interventions may be delayed as a result of limited staffing during the pandemic.*     Medical Decision Making:  60 yo M here with acute hypoxic resp failure, with cough, fever, and sputum production. CXR reviewed, concerning for PNA with effusion. Labs reviewed and show marked leukocytosis, hyponatremia, elevated procal. COVID negative. Renal function normal. BNP slightly elevated. EKG nonischemic. Given tachypnea, pleurisy with hypoxia, CT Angio obtained and is concerning for empyema.   Discussed case with Dr. Orvan Seen of CT Surgery at [facility]. Will admit to Hospitalist service at [facility] for treatment and evaluation. Pt updated and is in agreement. He has been given fluids, IV ABX, and O2 supplementation. Cultures sent.  ____________________________________________  FINAL CLINICAL IMPRESSION(S) / ED DIAGNOSES  Final diagnoses:  Acute respiratory failure with hypoxemia (HCC)   Empyema ([place])     MEDICATIONS GIVEN DURING THIS VISIT:  Medications  acetaminophen (TYLENOL) tablet 650 mg (650 mg Oral Given 08/10/20 1941)  sodium chloride 0.9 % bolus 1,000 mL (0 mLs Intravenous Stopped 08/10/20 2057)  ipratropium-albuterol (DUONEB) 0.5-2.5 (3) MG/3ML nebulizer solution 3 mL (3 mLs Nebulization Given 08/10/20 2025)  ipratropium-albuterol (DUONEB) 0.5-2.5 (3) MG/3ML nebulizer solution 3 mL (3 mLs Nebulization Given 08/10/20 2024)  ipratropium-albuterol (DUONEB) 0.5-2.5 (3) MG/3ML nebulizer solution 3 mL (3 mLs Nebulization Given 08/10/20 2024)  methylPREDNISolone sodium succinate (SOLU-MEDROL) 125 mg/2 mL injection 125 mg (125 mg Intravenous Given 08/10/20 2022)  iohexol (OMNIPAQUE) 350 MG/ML injection 100 mL (100 mLs Intravenous Contrast Given 08/10/20 2003)  piperacillin-tazobactam (ZOSYN) IVPB 3.375 g (0 g Intravenous Stopped 08/10/20 2146)     ED Discharge Orders    None       Note:  This document was prepared using Dragon voice recognition software and may include unintentional dictation errors.   Duffy Bruce, MD 08/11/20 [phone]

## 2020-08-10 NOTE — Progress Notes (Signed)
PHARMACY -  BRIEF ANTIBIOTIC NOTE   Pharmacy has received consult(s) for Zosyn from an ED provider.  The patient's profile has been reviewed for ht/wt/allergies/indication/available labs.    One time order(s) placed for Zosyn 3.375g IV x 1 dose.  Further antibiotics/pharmacy consults should be ordered by admitting physician if indicated.                       Thank you, Bettey Costa 08/10/2020  8:34 PM

## 2020-08-11 ENCOUNTER — Inpatient Hospital Stay (HOSPITAL_COMMUNITY): Payer: Medicaid Other

## 2020-08-11 ENCOUNTER — Inpatient Hospital Stay (HOSPITAL_COMMUNITY)
Admission: AD | Admit: 2020-08-11 | Discharge: 2020-08-24 | DRG: 853 | Disposition: A | Discharge status: Home Health | Payer: Medicaid Other | Source: Other Acute Inpatient Hospital | Attending: Family Medicine | Admitting: Family Medicine

## 2020-08-11 ENCOUNTER — Encounter (HOSPITAL_COMMUNITY): Payer: Self-pay | Admitting: Family Medicine

## 2020-08-11 DIAGNOSIS — Z95820 Peripheral vascular angioplasty status with implants and grafts: Secondary | ICD-10-CM | POA: Diagnosis not present

## 2020-08-11 DIAGNOSIS — E669 Obesity, unspecified: Secondary | ICD-10-CM | POA: Diagnosis not present

## 2020-08-11 DIAGNOSIS — A419 Sepsis, unspecified organism: Secondary | ICD-10-CM | POA: Diagnosis present

## 2020-08-11 DIAGNOSIS — Z79899 Other long term (current) drug therapy: Secondary | ICD-10-CM | POA: Diagnosis not present

## 2020-08-11 DIAGNOSIS — J869 Pyothorax without fistula: Secondary | ICD-10-CM | POA: Diagnosis present

## 2020-08-11 DIAGNOSIS — Z89412 Acquired absence of left great toe: Secondary | ICD-10-CM | POA: Diagnosis not present

## 2020-08-11 DIAGNOSIS — Z09 Encounter for follow-up examination after completed treatment for conditions other than malignant neoplasm: Secondary | ICD-10-CM

## 2020-08-11 DIAGNOSIS — G4700 Insomnia, unspecified: Secondary | ICD-10-CM | POA: Diagnosis not present

## 2020-08-11 DIAGNOSIS — J9811 Atelectasis: Secondary | ICD-10-CM | POA: Diagnosis not present

## 2020-08-11 DIAGNOSIS — F4310 Post-traumatic stress disorder, unspecified: Secondary | ICD-10-CM | POA: Diagnosis present

## 2020-08-11 DIAGNOSIS — R109 Unspecified abdominal pain: Secondary | ICD-10-CM

## 2020-08-11 DIAGNOSIS — I82462 Acute embolism and thrombosis of left calf muscular vein: Secondary | ICD-10-CM | POA: Diagnosis present

## 2020-08-11 DIAGNOSIS — K219 Gastro-esophageal reflux disease without esophagitis: Secondary | ICD-10-CM | POA: Diagnosis present

## 2020-08-11 DIAGNOSIS — J9691 Respiratory failure, unspecified with hypoxia: Secondary | ICD-10-CM

## 2020-08-11 DIAGNOSIS — R1032 Left lower quadrant pain: Secondary | ICD-10-CM

## 2020-08-11 DIAGNOSIS — I5032 Chronic diastolic (congestive) heart failure: Secondary | ICD-10-CM

## 2020-08-11 DIAGNOSIS — D509 Iron deficiency anemia, unspecified: Secondary | ICD-10-CM | POA: Diagnosis present

## 2020-08-11 DIAGNOSIS — Z7902 Long term (current) use of antithrombotics/antiplatelets: Secondary | ICD-10-CM | POA: Diagnosis not present

## 2020-08-11 DIAGNOSIS — Z4682 Encounter for fitting and adjustment of non-vascular catheter: Secondary | ICD-10-CM

## 2020-08-11 DIAGNOSIS — E7800 Pure hypercholesterolemia, unspecified: Secondary | ICD-10-CM | POA: Diagnosis present

## 2020-08-11 DIAGNOSIS — Z7984 Long term (current) use of oral hypoglycemic drugs: Secondary | ICD-10-CM

## 2020-08-11 DIAGNOSIS — Z888 Allergy status to other drugs, medicaments and biological substances status: Secondary | ICD-10-CM

## 2020-08-11 DIAGNOSIS — I10 Essential (primary) hypertension: Secondary | ICD-10-CM | POA: Diagnosis present

## 2020-08-11 DIAGNOSIS — J9601 Acute respiratory failure with hypoxia: Secondary | ICD-10-CM | POA: Diagnosis present

## 2020-08-11 DIAGNOSIS — D62 Acute posthemorrhagic anemia: Secondary | ICD-10-CM | POA: Diagnosis not present

## 2020-08-11 DIAGNOSIS — D649 Anemia, unspecified: Secondary | ICD-10-CM | POA: Diagnosis not present

## 2020-08-11 DIAGNOSIS — E871 Hypo-osmolality and hyponatremia: Secondary | ICD-10-CM

## 2020-08-11 DIAGNOSIS — K5900 Constipation, unspecified: Secondary | ICD-10-CM | POA: Diagnosis present

## 2020-08-11 DIAGNOSIS — F602 Antisocial personality disorder: Secondary | ICD-10-CM | POA: Diagnosis not present

## 2020-08-11 DIAGNOSIS — M79605 Pain in left leg: Secondary | ICD-10-CM | POA: Diagnosis not present

## 2020-08-11 DIAGNOSIS — Z91030 Bee allergy status: Secondary | ICD-10-CM

## 2020-08-11 DIAGNOSIS — I110 Hypertensive heart disease with heart failure: Secondary | ICD-10-CM | POA: Diagnosis present

## 2020-08-11 DIAGNOSIS — E1151 Type 2 diabetes mellitus with diabetic peripheral angiopathy without gangrene: Secondary | ICD-10-CM | POA: Diagnosis present

## 2020-08-11 DIAGNOSIS — E1165 Type 2 diabetes mellitus with hyperglycemia: Secondary | ICD-10-CM

## 2020-08-11 DIAGNOSIS — Z6829 Body mass index (BMI) 29.0-29.9, adult: Secondary | ICD-10-CM

## 2020-08-11 DIAGNOSIS — E1142 Type 2 diabetes mellitus with diabetic polyneuropathy: Secondary | ICD-10-CM | POA: Diagnosis present

## 2020-08-11 DIAGNOSIS — E785 Hyperlipidemia, unspecified: Secondary | ICD-10-CM | POA: Diagnosis present

## 2020-08-11 DIAGNOSIS — F17210 Nicotine dependence, cigarettes, uncomplicated: Secondary | ICD-10-CM | POA: Diagnosis present

## 2020-08-11 DIAGNOSIS — M79609 Pain in unspecified limb: Secondary | ICD-10-CM | POA: Diagnosis not present

## 2020-08-11 DIAGNOSIS — I739 Peripheral vascular disease, unspecified: Secondary | ICD-10-CM | POA: Diagnosis not present

## 2020-08-11 DIAGNOSIS — J90 Pleural effusion, not elsewhere classified: Secondary | ICD-10-CM | POA: Diagnosis not present

## 2020-08-11 DIAGNOSIS — Z91018 Allergy to other foods: Secondary | ICD-10-CM

## 2020-08-11 LAB — BASIC METABOLIC PANEL
Anion gap: 15 (ref 5–15)
BUN: 27 mg/dL — ABNORMAL HIGH (ref 6–20)
CO2: 23 mmol/L (ref 22–32)
Calcium: 8.8 mg/dL — ABNORMAL LOW (ref 8.9–10.3)
Chloride: 91 mmol/L — ABNORMAL LOW (ref 98–111)
Creatinine, Ser: 1.04 mg/dL (ref 0.61–1.24)
GFR, Estimated: >60 mL/min (ref >60)
Glucose, Bld: 423 mg/dL — ABNORMAL HIGH (ref 70–99)
Potassium: 4.9 mmol/L (ref 3.5–5.1)
Sodium: 129 mmol/L — ABNORMAL LOW (ref 135–145)

## 2020-08-11 LAB — GLUCOSE, CAPILLARY
Glucose-Capillary: 134 mg/dL — ABNORMAL HIGH (ref 70–99)
Glucose-Capillary: 338 mg/dL — ABNORMAL HIGH (ref 70–99)
Glucose-Capillary: 400 mg/dL — ABNORMAL HIGH (ref 70–99)
Glucose-Capillary: 405 mg/dL — ABNORMAL HIGH (ref 70–99)
Glucose-Capillary: 436 mg/dL — ABNORMAL HIGH (ref 70–99)
Glucose-Capillary: 92 mg/dL (ref 70–99)

## 2020-08-11 LAB — BLOOD GAS, VENOUS
Acid-Base Excess: 1 mmol/L (ref 0.0–2.0)
Bicarbonate: 24.9 mmol/L (ref 20.0–28.0)
O2 Saturation: 87 %
Patient temperature: 37
pCO2, Ven: 38.7 mmHg — ABNORMAL LOW (ref 44.0–60.0)
pH, Ven: 7.425 (ref 7.250–7.430)
pO2, Ven: 53 mmHg — ABNORMAL HIGH (ref 32.0–45.0)

## 2020-08-11 LAB — CBC
HCT: 35 % — ABNORMAL LOW (ref 39.0–52.0)
Hemoglobin: 11.9 g/dL — ABNORMAL LOW (ref 13.0–17.0)
MCH: 32.4 pg (ref 26.0–34.0)
MCHC: 34 g/dL (ref 30.0–36.0)
MCV: 95.4 fL (ref 80.0–100.0)
Platelets: 509 10*3/uL — ABNORMAL HIGH (ref 150–400)
RBC: 3.67 MIL/uL — ABNORMAL LOW (ref 4.22–5.81)
RDW: 13 % (ref 11.5–15.5)
WBC: 16.9 10*3/uL — ABNORMAL HIGH (ref 4.0–10.5)
nRBC: 0 % (ref 0.0–0.2)

## 2020-08-11 MED ORDER — LACTATED RINGERS IV SOLN: INTRAVENOUS | Status: DC

## 2020-08-11 MED ORDER — INSULIN ASPART 100 UNIT/ML ~~LOC~~ SOLN: 0.0000 [IU] | Freq: Every day | SUBCUTANEOUS | Status: DC

## 2020-08-11 MED ORDER — IOHEXOL 9 MG/ML PO SOLN
ORAL | Status: AC
  Administered 2020-08-11: 500 mL
  Filled 2020-08-11: qty 500

## 2020-08-11 MED ORDER — ROSUVASTATIN CALCIUM 5 MG PO TABS
5.0000 mg | ORAL_TABLET | Freq: Every day | ORAL | Status: DC
  Administered 2020-08-12 – 2020-08-24 (×13): 5 mg via ORAL
  Filled 2020-08-11 (×13): qty 1

## 2020-08-11 MED ORDER — MAGNESIUM OXIDE 400 (241.3 MG) MG PO TABS
400.0000 mg | ORAL_TABLET | Freq: Every day | ORAL | Status: DC
  Administered 2020-08-12 – 2020-08-24 (×13): 400 mg via ORAL
  Filled 2020-08-11 (×13): qty 1

## 2020-08-11 MED ORDER — PIPERACILLIN-TAZOBACTAM 3.375 G IVPB
3.3750 g | Freq: Three times a day (TID) | INTRAVENOUS | Status: DC
  Administered 2020-08-11 – 2020-08-20 (×27): 3.375 g via INTRAVENOUS
  Filled 2020-08-11 (×30): qty 50

## 2020-08-11 MED ORDER — INSULIN ASPART 100 UNIT/ML ~~LOC~~ SOLN: 0.0000 [IU] | Freq: Three times a day (TID) | SUBCUTANEOUS | Status: DC

## 2020-08-11 MED ORDER — INSULIN DETEMIR 100 UNIT/ML ~~LOC~~ SOLN
15.0000 [IU] | Freq: Every day | SUBCUTANEOUS | Status: DC
  Filled 2020-08-11: qty 0.15

## 2020-08-11 MED ORDER — CLONIDINE HCL 0.1 MG PO TABS
0.1000 mg | ORAL_TABLET | Freq: Every day | ORAL | Status: DC
  Administered 2020-08-12 – 2020-08-23 (×13): 0.1 mg via ORAL
  Filled 2020-08-11 (×13): qty 1

## 2020-08-11 MED ORDER — INSULIN REGULAR(HUMAN) IN NACL 100-0.9 UT/100ML-% IV SOLN
INTRAVENOUS | Status: DC
  Administered 2020-08-11: 24 [IU]/h via INTRAVENOUS
  Administered 2020-08-12: 1.8 [IU]/h via INTRAVENOUS
  Filled 2020-08-11 (×2): qty 100

## 2020-08-11 MED ORDER — PANTOPRAZOLE SODIUM 40 MG PO TBEC
40.0000 mg | DELAYED_RELEASE_TABLET | Freq: Every day | ORAL | Status: DC
  Administered 2020-08-12 – 2020-08-24 (×13): 40 mg via ORAL
  Filled 2020-08-11 (×13): qty 1

## 2020-08-11 MED ORDER — DEXTROSE IN LACTATED RINGERS 5 % IV SOLN: INTRAVENOUS | Status: DC

## 2020-08-11 MED ORDER — GABAPENTIN 300 MG PO CAPS
300.0000 mg | ORAL_CAPSULE | Freq: Three times a day (TID) | ORAL | Status: DC
  Administered 2020-08-12 – 2020-08-24 (×36): 300 mg via ORAL
  Filled 2020-08-11 (×36): qty 1

## 2020-08-11 MED ORDER — AMLODIPINE BESYLATE 5 MG PO TABS
5.0000 mg | ORAL_TABLET | Freq: Every day | ORAL | Status: DC
  Administered 2020-08-12 – 2020-08-24 (×11): 5 mg via ORAL
  Filled 2020-08-11 (×13): qty 1

## 2020-08-11 MED ORDER — PANTOPRAZOLE SODIUM 40 MG PO TBEC
40.0000 mg | DELAYED_RELEASE_TABLET | Freq: Every day | ORAL | Status: DC
  Administered 2020-08-11: 40 mg via ORAL
  Filled 2020-08-11: qty 1

## 2020-08-11 MED ORDER — IOHEXOL 300 MG/ML  SOLN
100.0000 mL | Freq: Once | INTRAMUSCULAR | Status: AC | PRN
  Administered 2020-08-11: 100 mL via INTRAVENOUS

## 2020-08-11 MED ORDER — DEXTROSE 50 % IV SOLN
0.0000 mL | INTRAVENOUS | Status: DC | PRN
Start: 2020-08-11 — End: 2020-08-24

## 2020-08-11 MED ORDER — CITALOPRAM HYDROBROMIDE 20 MG PO TABS
20.0000 mg | ORAL_TABLET | Freq: Every day | ORAL | Status: DC
  Administered 2020-08-12 – 2020-08-24 (×13): 20 mg via ORAL
  Filled 2020-08-11 (×13): qty 1

## 2020-08-11 NOTE — ED Notes (Addendum)
Pt told this RN that he was not supposed to have surgery until tomorrow morning- pt given food tray

## 2020-08-11 NOTE — Progress Notes (Signed)
Charge Nurse said they are STAT paging Dr. Cyndia Bent.

## 2020-08-11 NOTE — ED Notes (Addendum)
Attempted to give report to [facility] at [phone] was told RN would give a call back

## 2020-08-11 NOTE — Progress Notes (Signed)
Called [facility]. They said to call admit again.

## 2020-08-11 NOTE — ED Notes (Signed)
Pt given water 

## 2020-08-11 NOTE — Progress Notes (Signed)
Pt to transfer to 2W22. Report given to [place], Charity fundraiser. All belongings with the pt.

## 2020-08-11 NOTE — ED Notes (Signed)
Pt up to use restroom, new sheet put on bed. Pt changed out of clothes into hospital gown and scrub pants.

## 2020-08-11 NOTE — ED Notes (Addendum)
Report given to [facility]- they state ETA 25 minutes

## 2020-08-11 NOTE — ED Notes (Signed)
Per Gala Romney at [facility] pt is still awaiting a bed

## 2020-08-11 NOTE — H&P (Signed)
History and Physical    Timothy [name]. KBT:[id number] DOB: 08-03-1960 DOA: 08/11/2020  PCP: Langston Reusing, NP  Patient coming from: [facility] ED  I have personally briefly reviewed patient's old medical records in [facility]  Chief Complaint: worsening shortness of breath   HPI: Kalden Wanke. is a 60 y.o. male with medical history significant for hypertension, type 2 diabetes, chronic diastolic heart failure, history of osteomyelitis s/p amputation of the left great toe, GERD, PTSD, antisocial personality disorder, and hyperlipidemia who presents from [facility] ED for acute hypoxic respiratory failure secondary to left-sided empyema and the need for CT surgery consult at [facility].  On arrival to the unit, patient was found to have a bedside glucose check of 400.  Reportedly a rapid response was called but I did not receive any stat paging as documented.  Patient did not have any repeat labs today.  Unclear whether he is in DKA.  I evaluated patient promptly at bedside.  He was alert and oriented x4.  Patient reports that about 9 days ago he awoke from his sleep with significant left-sided chest pain and abdominal spasm.  Had shortness of breath while sleeping and with exertion.  Then he developed coughing spells and noticed blood-tinged sputum for about 2 days.  Also has been having associated nausea, nonbloody vomiting and diarrhea daily.  Has had decreased p.o. intake.  Have chills but unclear whether he had fevers.  Feels extremely fatigued.  Denies any sick contact.  Patient smokes about 1 pack of cigarettes per day and has about a 40-year smoking history.  He has missed his medication for about 3 days.  States he only received home Nexium while in the ED.  At [facility] ED, he was placed on 3 L of O2 via nasal cannula without documented hypoxia. CTA chest was obtained due to elevated D-dimer but no PE was found.  However there was a large pleural effusion on  the left with multiple loculation worrisome for empyema. CBC showed leukocytosis of 13.5, microcytic anemia with hemoglobin of 11.9.  Hyponatremia of 126 with glucose of 243.  BNP of 222.  Troponin flat at 10 and 12.  Procalcitonin elevated to 0.48.  He was started on IV Zosyn on 2/23.  Review of Systems: Constitutional: No Weight Change, No Fever ENT/Mouth: No sore throat, No Rhinorrhea Eyes: No Eye Pain, No Vision Changes Cardiovascular: + Chest Pain, +SOB, No PND, +Dyspnea on Exertion, No Orthopnea, No Claudication, No Edema, No Palpitations Respiratory: +Cough, No Sputum, No Wheezing, + Dyspnea  Gastrointestinal: + Nausea, + Vomiting, + Diarrhea, No Constipation,+ Pain Genitourinary: no Urinary Incontinence, No Urgency, No Flank Pain Musculoskeletal: No Arthralgias, No Myalgias Skin: No Skin Lesions, No Pruritus, Neuro: no Weakness, No Numbness,  No Loss of Consciousness, No Syncope Psych: No Anxiety/Panic, No Depression, + decrease appetite Heme/Lymph: No Bruising, No Bleeding Past Medical History:  Diagnosis Date  . CHF (congestive heart failure) ([place])   . Diabetes mellitus without complication ([place])   . Hypercholesteremia   . Hypertension     Past Surgical History:  Procedure Laterality Date  . AMPUTATION Left 03/22/2020   Procedure: AMPUTATION RAY-1st Ray Left Foot;  Surgeon: [name], [name];  Location: [facility] ORS;  Service: Podiatry;  Laterality: Left;  . AMPUTATION TOE Left 03/16/2020   Procedure: AMPUTATION GREAT TOE;  Surgeon: [name], [name];  Location: [facility] ORS;  Service: Podiatry;  Laterality: Left;  . COLONOSCOPY WITH PROPOFOL N/A 12/22/2019  Procedure: COLONOSCOPY WITH PROPOFOL;  Surgeon: [name], MD;  Location: [facility] ENDOSCOPY;  Service: Endoscopy;  Laterality: N/A;  . LOWER EXTREMITY ANGIOGRAPHY Left 03/18/2020   Procedure: Lower Extremity Angiography;  Surgeon: [name], MD;  Location: [facility];  Service: Cardiovascular;  Laterality: Left;   . TONSILLECTOMY    . WISDOM TOOTH EXTRACTION       reports that he has been smoking cigarettes. He has a 82.00 pack-year smoking history. He has never used smokeless tobacco. He reports current alcohol use of about 6.0 standard drinks of alcohol per week. He reports previous drug use. Social History  Allergies  Allergen Reactions  . Bee Venom Anaphylaxis  . Coffee Flavor Shortness Of Breath  . Cheese Swelling    Blue Cheese only    Family History  Family history unknown: Yes     Prior to Admission medications   Medication Sig Start Date End Date Taking? Authorizing Provider  Acetaminophen (ARTHRITIS PAIN PO) Take 1,000 mg by mouth daily.    [provider]  albuterol (VENTOLIN HFA) 108 (90 Base) MCG/ACT inhaler Inhale 1 puff into the lungs every 6 (six) hours as needed for wheezing or shortness of breath. 05/26/20   Tyler Pita, MD  amLODipine (NORVASC) 5 MG tablet Take 1 tablet (5 mg total) by mouth daily. 08/08/20 11/06/20  Kate Sable, MD  blood glucose meter kit and supplies KIT Dispense based on patient and insurance preference. Use up to four times daily as directed. (FOR ICD-9 250.00, 250.01). 10/15/19   Iloabachie, Chioma E, NP  calcium carbonate (TUMS - DOSED IN MG ELEMENTAL CALCIUM) 500 MG chewable tablet Chew by mouth.    [provider]  citalopram (CELEXA) 20 MG tablet TAKE ONE TABLET BY MOUTH EVERY DAY Patient taking differently: Take 20 mg by mouth daily. 07/13/20   Iloabachie, Chioma E, NP  cloNIDine (CATAPRES) 0.1 MG tablet TAKE ONE TABLET BY MOUTH AT BEDTIME Patient taking differently: Take 0.1 mg by mouth at bedtime. 06/07/20   Iloabachie, Chioma E, NP  clopidogrel (PLAVIX) 75 MG tablet Take 1 tablet (75 mg total) by mouth daily with breakfast. 04/20/20   Iloabachie, Chioma E, NP  esomeprazole (NEXIUM) 20 MG capsule Take 1 capsule (20 mg total) by mouth daily at 12 noon. 06/29/20   Iloabachie, Chioma E, NP  gabapentin (NEURONTIN) 300 MG  capsule Take 1 capsule (300 mg total) by mouth 3 (three) times daily. 06/29/20   Iloabachie, Chioma E, NP  glipiZIDE (GLUCOTROL) 5 MG tablet Take 10 mg in the morning and 5 mg in the evening 06/29/20   Iloabachie, Chioma E, NP  Magnesium 400 MG CAPS Take 400 mg by mouth daily.    [provider]  metFORMIN (GLUCOPHAGE) 1000 MG tablet Take 1 tablet (1,000 mg total) by mouth 2 (two) times daily with a meal. 06/29/20 06/29/21  Iloabachie, Chioma E, NP  rosuvastatin (CRESTOR) 5 MG tablet Take 1 tablet (5 mg total) by mouth daily. 06/29/20   Iloabachie, Chioma E, NP  sacubitril-valsartan (ENTRESTO) 24-26 MG Take 1 tablet by mouth 2 (two) times daily. 08/03/20   Alisa Graff, FNP  sacubitril-valsartan (ENTRESTO) 24-26 MG Take 1 tablet by mouth 2 (two) times daily. 08/04/20   Alisa Graff, FNP  triamcinolone (KENALOG) 0.025 % ointment Apply 1 application topically 2 (two) times daily. To buttocks 05/25/20   Erlene Quan, NP    Physical Exam: Vitals:   08/11/20 1825  BP: 131/80  Pulse: 95  Resp: 18  Temp: 98 F (36.7 C)  TempSrc: Oral  SpO2: 94%    Constitutional: NAD, calm, comfortable, obese male laying flat in bed Vitals:   08/11/20 1825  BP: 131/80  Pulse: 95  Resp: 18  Temp: 98 F (36.7 C)  TempSrc: Oral  SpO2: 94%   Eyes: PERRL, lids and conjunctivae normal ENMT: Mucous membranes are moist.  Poor dentition.   Neck: normal, supple, no masses, no thyromegaly Respiratory: clear to auscultation bilaterally, no wheezing, no crackles. Normal respiratory effort. No accessory muscle use.  Cardiovascular: Regular rate and rhythm, no murmurs / rubs / gallops. No extremity edema. 2+ pedal pulses.  Abdomen: Tenderness and voluntary guarding of the left upper and lower quadrant, no rebound tenderness or rigidity, no masses palpated. Bowel sounds positive.  Musculoskeletal: no clubbing / cyanosis.  History of amputated left great toe.  Good ROM, no contractures. Normal muscle  tone.  Skin: no rashes, lesions, ulcers. No induration Neurologic: CN 2-12 grossly intact. Sensation intact,Strength 5/5 in all 4.  Psychiatric: Normal judgment and insight. Alert and oriented x 3. Normal mood.    Labs on Admission: I have personally reviewed following labs and imaging studies  CBC: Recent Labs  Lab 08/10/20 1842  WBC 13.5*  NEUTROABS 11.2*  HGB 11.9*  HCT 35.4*  MCV 95.4  PLT 299*   Basic Metabolic Panel: Recent Labs  Lab 08/10/20 1842  NA 126*  K 4.4  CL 87*  CO2 24  GLUCOSE 243*  BUN 14  CREATININE 0.90  CALCIUM 8.6*   GFR: Estimated Creatinine Clearance: 104.1 mL/min (by C-G formula based on SCr of 0.9 mg/dL). Liver Function Tests: Recent Labs  Lab 08/10/20 1842  AST 17  ALT 18  ALKPHOS 139*  BILITOT 1.4*  PROT 7.4  ALBUMIN 2.6*   No results for input(s): LIPASE, AMYLASE in the last 168 hours. No results for input(s): AMMONIA in the last 168 hours. Coagulation Profile: No results for input(s): INR, PROTIME in the last 168 hours. Cardiac Enzymes: No results for input(s): CKTOTAL, CKMB, CKMBINDEX, TROPONINI in the last 168 hours. BNP (last 3 results) No results for input(s): PROBNP in the last 8760 hours. HbA1C: No results for input(s): HGBA1C in the last 72 hours. CBG: Recent Labs  Lab 08/11/20 1830  GLUCAP 400*   Lipid Profile: No results for input(s): CHOL, HDL, LDLCALC, TRIG, CHOLHDL, LDLDIRECT in the last 72 hours. Thyroid Function Tests: No results for input(s): TSH, T4TOTAL, FREET4, T3FREE, THYROIDAB in the last 72 hours. Anemia Panel: No results for input(s): VITAMINB12, FOLATE, FERRITIN, TIBC, IRON, RETICCTPCT in the last 72 hours. Urine analysis:    Component Value Date/Time   APPEARANCEUR Cloudy (A) 01/13/2020 1156   GLUCOSEU 3+ (A) 01/13/2020 1156   BILIRUBINUR Negative 01/13/2020 1156   PROTEINUR 1+ (A) 01/13/2020 1156   NITRITE Negative 01/13/2020 1156   LEUKOCYTESUR Negative 01/13/2020 1156    Radiological  Exams on Admission: CT Angio Chest PE W and/or Wo Contrast  Result Date: 08/10/2020 CLINICAL DATA:  Positive D-dimer. Poor oxygen saturation. Abnormal chest radiography. EXAM: CT ANGIOGRAPHY CHEST WITH CONTRAST TECHNIQUE: Multidetector CT imaging of the chest was performed using the standard protocol during bolus administration of intravenous contrast. Multiplanar CT image reconstructions and MIPs were obtained to evaluate the vascular anatomy. CONTRAST:  149m OMNIPAQUE IOHEXOL 350 MG/ML SOLN COMPARISON:  Chest radiography same day.  CT 11/03/2019 FINDINGS: Cardiovascular: Heart size is normal. No pericardial fluid. There is some coronary artery calcification. There is aortic  atherosclerotic calcification. Pulmonary arterial opacification is good. There are no pulmonary emboli. Mediastinum/Nodes: Mild reactive nodal prominence in the mediastinum. No dominant node. Lungs/Pleura: Right chest is clear. No infiltrate, effusion or collapse. On the left, there is a large amount of pleural fluid with multiple loculations worrisome for empyema. There is considerable volume loss in the adjacent lung, particularly affecting the left lower lobe. Upper Abdomen: No significant upper abdominal finding. Musculoskeletal: Ordinary mild thoracic degenerative changes. Review of the MIP images confirms the above findings. IMPRESSION: 1. No pulmonary emboli. 2. Large amount of pleural fluid on the left with multiple loculations worrisome for empyema. Volume loss in the adjacent lung, particularly affecting the left lower lobe. 3. Aortic atherosclerosis. Coronary artery calcification. Aortic Atherosclerosis (ICD10-I70.0). Electronically Signed   By: Nelson Chimes M.D.   On: 08/10/2020 20:18   DG Chest Portable 1 View  Result Date: 08/10/2020 CLINICAL DATA:  Short of breath.  Chest pain. EXAM: PORTABLE CHEST 1 VIEW COMPARISON:  03/15/2020 FINDINGS: There is a moderate to large left pleural effusion with significantly diminished  aeration to the left lower and left mid lung. Veil like opacification of the left upper lung is also noted. Right lung appears clear. Visualized osseous structures are unremarkable. IMPRESSION: Moderate to large left pleural effusion with significantly diminished aeration to the left lower and left mid lung. This is new when compared with the previous exam. Electronically Signed   By: Kerby Moors M.D.   On: 08/10/2020 19:58      Assessment/Plan  Hyperglycemia with history of type 2 diabetes Presented as a transfer from [facility] ED with glucose of 400.  No new chemistry panel was obtained today so unknown whether patient was in DKA. Will start insulin infusion and transfer to progressive unit Obtain stat BMP and CBC replete potassium as needed - continue insulin gtt with goal of 140-180 and AG <12 - IV NS until BG <250, then switch to D5 1/2 NS  - BMP q4hr  - keep NPO  acute hypoxic respiratory failure secondary to left-sided empyema Continue IV Zosyn Will need to consult CT surgery in the morning. ED physician had spoken with Dr. Orvan Seen   Abdominal pain  Pt noted to have significant tenderness to the entire left abdomen especially to the left lower quadrant.  He reports having changes in bowel movement consistency for the past 3 months ranging from constipation and  to diarrhea.  Denies having any weight loss but has actually gained significant amount of weight. Colonscopy 7/21 showed diverticulosis of the entire colon with internal hemorrhoids Obtain CT abdomen and pelvis  Pseudohyponatremia Due to hyperglycemia corrects to 134.  Continue to follow with IV continuous normal saline fluid.  Normocytic anemia Hemoglobin of 11.9.  No melena or hematochezia per pt Check iron panel, vitamin T62 and folic   HTN Continue home amlodipine, clonidine    Chronic diastolic heart failure Appears euvolemic exam.  Monitor fluid status closely with continuous IV fluid while on insulin  infusion   Level of care: Progressive  Status is: Inpatient  Remains inpatient appropriate because:Inpatient level of care appropriate due to severity of illness   Dispo: The patient is from: Home              Anticipated d/c is to: Home              Patient currently is not medically stable to d/c.   Difficult to place patient No  Orene Desanctis DO [facility]   If 7PM-7AM, please contact night-coverage [email or web address]   08/11/2020, 8:12 PM

## 2020-08-11 NOTE — Progress Notes (Signed)
Received pt on unit at 1816. No orders. CBG 400 at 1830. Text paged Dr. Rachael Darby at [phone]. No response. Paged [facility] admits and consults. No response.

## 2020-08-11 NOTE — Progress Notes (Signed)
Dr. Cyndia Bent called back at 1910. She is putting in some orders and coming straight up to the unit.

## 2020-08-11 NOTE — ED Notes (Signed)
Per Ruby @ [facility] still no bed available

## 2020-08-11 NOTE — ED Notes (Signed)
Pharmacy tech at bedside 

## 2020-08-11 NOTE — ED Notes (Signed)
Asked Dr Cyril Loosen if possible to order home medications- Dr Cyril Loosen states that pt has to remain NPO for possible surgery

## 2020-08-11 NOTE — ED Notes (Addendum)
Pt taken off monitoring equipment to have a BM- pt given clean pants and mesh underwear- pt linen changed and chux pad placed on bed

## 2020-08-11 NOTE — ED Notes (Addendum)
Pt getting frustrated not being able to eat and not being able to have his medications- explained to pt that we are waiting on [facility] doctors orders- pt requesting his nexium to help with acid reflux from hunger

## 2020-08-11 NOTE — ED Notes (Signed)
Dr Derrill Kay gave VO for protonix

## 2020-08-11 NOTE — ED Notes (Signed)
Pt asking about home medications- will ask pharmacy tech to do med rec

## 2020-08-12 DIAGNOSIS — I5032 Chronic diastolic (congestive) heart failure: Secondary | ICD-10-CM

## 2020-08-12 DIAGNOSIS — J869 Pyothorax without fistula: Secondary | ICD-10-CM

## 2020-08-12 LAB — BASIC METABOLIC PANEL
Anion gap: 14 (ref 5–15)
Anion gap: 15 (ref 5–15)
Anion gap: 14 (ref 5–15)
Anion gap: 11 (ref 5–15)
Anion gap: 10 (ref 5–15)
Anion gap: 11 (ref 5–15)
BUN: 22 mg/dL — ABNORMAL HIGH (ref 6–20)
BUN: 20 mg/dL (ref 6–20)
BUN: 17 mg/dL (ref 6–20)
BUN: 15 mg/dL (ref 6–20)
BUN: 14 mg/dL (ref 6–20)
BUN: 13 mg/dL (ref 6–20)
CO2: 24 mmol/L (ref 22–32)
CO2: 25 mmol/L (ref 22–32)
CO2: 25 mmol/L (ref 22–32)
CO2: 28 mmol/L (ref 22–32)
CO2: 28 mmol/L (ref 22–32)
CO2: 27 mmol/L (ref 22–32)
Calcium: 8.7 mg/dL — ABNORMAL LOW (ref 8.9–10.3)
Calcium: 8.6 mg/dL — ABNORMAL LOW (ref 8.9–10.3)
Calcium: 8.5 mg/dL — ABNORMAL LOW (ref 8.9–10.3)
Calcium: 8.3 mg/dL — ABNORMAL LOW (ref 8.9–10.3)
Calcium: 8.3 mg/dL — ABNORMAL LOW (ref 8.9–10.3)
Calcium: 8.4 mg/dL — ABNORMAL LOW (ref 8.9–10.3)
Chloride: 93 mmol/L — ABNORMAL LOW (ref 98–111)
Chloride: 93 mmol/L — ABNORMAL LOW (ref 98–111)
Chloride: 94 mmol/L — ABNORMAL LOW (ref 98–111)
Chloride: 96 mmol/L — ABNORMAL LOW (ref 98–111)
Chloride: 96 mmol/L — ABNORMAL LOW (ref 98–111)
Chloride: 96 mmol/L — ABNORMAL LOW (ref 98–111)
Creatinine, Ser: 0.86 mg/dL (ref 0.61–1.24)
Creatinine, Ser: 0.76 mg/dL (ref 0.61–1.24)
Creatinine, Ser: 0.81 mg/dL (ref 0.61–1.24)
Creatinine, Ser: 0.91 mg/dL (ref 0.61–1.24)
Creatinine, Ser: 0.78 mg/dL (ref 0.61–1.24)
Creatinine, Ser: 1.04 mg/dL (ref 0.61–1.24)
GFR, Estimated: >60 mL/min (ref >60)
GFR, Estimated: >60 mL/min (ref >60)
GFR, Estimated: >60 mL/min (ref >60)
GFR, Estimated: >60 mL/min (ref >60)
GFR, Estimated: >60 mL/min (ref >60)
GFR, Estimated: >60 mL/min (ref >60)
Glucose, Bld: 183 mg/dL — ABNORMAL HIGH (ref 70–99)
Glucose, Bld: 166 mg/dL — ABNORMAL HIGH (ref 70–99)
Glucose, Bld: 143 mg/dL — ABNORMAL HIGH (ref 70–99)
Glucose, Bld: 165 mg/dL — ABNORMAL HIGH (ref 70–99)
Glucose, Bld: 151 mg/dL — ABNORMAL HIGH (ref 70–99)
Glucose, Bld: 136 mg/dL — ABNORMAL HIGH (ref 70–99)
Potassium: 4.1 mmol/L (ref 3.5–5.1)
Potassium: 3.9 mmol/L (ref 3.5–5.1)
Potassium: 3.9 mmol/L (ref 3.5–5.1)
Potassium: 4 mmol/L (ref 3.5–5.1)
Potassium: 3.9 mmol/L (ref 3.5–5.1)
Potassium: 4.1 mmol/L (ref 3.5–5.1)
Sodium: 131 mmol/L — ABNORMAL LOW (ref 135–145)
Sodium: 133 mmol/L — ABNORMAL LOW (ref 135–145)
Sodium: 133 mmol/L — ABNORMAL LOW (ref 135–145)
Sodium: 135 mmol/L (ref 135–145)
Sodium: 134 mmol/L — ABNORMAL LOW (ref 135–145)
Sodium: 134 mmol/L — ABNORMAL LOW (ref 135–145)

## 2020-08-12 LAB — GLUCOSE, CAPILLARY
Glucose-Capillary: 152 mg/dL — ABNORMAL HIGH (ref 70–99)
Glucose-Capillary: 170 mg/dL — ABNORMAL HIGH (ref 70–99)
Glucose-Capillary: 170 mg/dL — ABNORMAL HIGH (ref 70–99)
Glucose-Capillary: 160 mg/dL — ABNORMAL HIGH (ref 70–99)
Glucose-Capillary: 163 mg/dL — ABNORMAL HIGH (ref 70–99)
Glucose-Capillary: 138 mg/dL — ABNORMAL HIGH (ref 70–99)
Glucose-Capillary: 142 mg/dL — ABNORMAL HIGH (ref 70–99)
Glucose-Capillary: 145 mg/dL — ABNORMAL HIGH (ref 70–99)
Glucose-Capillary: 149 mg/dL — ABNORMAL HIGH (ref 70–99)
Glucose-Capillary: 163 mg/dL — ABNORMAL HIGH (ref 70–99)
Glucose-Capillary: 142 mg/dL — ABNORMAL HIGH (ref 70–99)
Glucose-Capillary: 143 mg/dL — ABNORMAL HIGH (ref 70–99)
Glucose-Capillary: 161 mg/dL — ABNORMAL HIGH (ref 70–99)
Glucose-Capillary: 170 mg/dL — ABNORMAL HIGH (ref 70–99)

## 2020-08-12 LAB — IRON AND TIBC
Iron: 17 ug/dL — ABNORMAL LOW (ref 45–182)
Saturation Ratios: 7 % — ABNORMAL LOW (ref 17.9–39.5)
TIBC: 230 ug/dL — ABNORMAL LOW (ref 250–450)
UIBC: 213 ug/dL

## 2020-08-12 LAB — CBC
HCT: 33.3 % — ABNORMAL LOW (ref 39.0–52.0)
Hemoglobin: 10.9 g/dL — ABNORMAL LOW (ref 13.0–17.0)
MCH: 31.5 pg (ref 26.0–34.0)
MCHC: 32.7 g/dL (ref 30.0–36.0)
MCV: 96.2 fL (ref 80.0–100.0)
Platelets: 474 10*3/uL — ABNORMAL HIGH (ref 150–400)
RBC: 3.46 MIL/uL — ABNORMAL LOW (ref 4.22–5.81)
RDW: 13 % (ref 11.5–15.5)
WBC: 14.9 10*3/uL — ABNORMAL HIGH (ref 4.0–10.5)
nRBC: 0 % (ref 0.0–0.2)

## 2020-08-12 LAB — URINALYSIS, ROUTINE W REFLEX MICROSCOPIC
Bilirubin Urine: NEGATIVE
Glucose, UA: NEGATIVE mg/dL
Ketones, ur: NEGATIVE mg/dL
Leukocytes,Ua: NEGATIVE
Nitrite: NEGATIVE
Protein, ur: NEGATIVE mg/dL
Specific Gravity, Urine: 1.024 (ref 1.005–1.030)
pH: 5 (ref 5.0–8.0)

## 2020-08-12 LAB — FOLATE: Folate: 7.3 ng/mL (ref >5.9)

## 2020-08-12 LAB — VITAMIN B12: Vitamin B-12: 186 pg/mL (ref 180–914)

## 2020-08-12 MED ORDER — OXYCODONE HCL 5 MG PO TABS
5.0000 mg | ORAL_TABLET | ORAL | Status: DC | PRN
  Administered 2020-08-12 – 2020-08-24 (×40): 5 mg via ORAL
  Filled 2020-08-12 (×40): qty 1

## 2020-08-12 MED ORDER — INSULIN ASPART 100 UNIT/ML ~~LOC~~ SOLN
0.0000 [IU] | Freq: Three times a day (TID) | SUBCUTANEOUS | Status: DC
  Administered 2020-08-13: 2 [IU] via SUBCUTANEOUS
  Administered 2020-08-13 – 2020-08-14 (×2): 3 [IU] via SUBCUTANEOUS
  Administered 2020-08-14 (×2): 2 [IU] via SUBCUTANEOUS
  Administered 2020-08-15: 3 [IU] via SUBCUTANEOUS
  Administered 2020-08-16: 8 [IU] via SUBCUTANEOUS
  Administered 2020-08-16: 5 [IU] via SUBCUTANEOUS
  Administered 2020-08-16 – 2020-08-17 (×2): 3 [IU] via SUBCUTANEOUS
  Administered 2020-08-17: 5 [IU] via SUBCUTANEOUS
  Administered 2020-08-17 – 2020-08-18 (×2): 3 [IU] via SUBCUTANEOUS
  Administered 2020-08-19 – 2020-08-20 (×6): 2 [IU] via SUBCUTANEOUS
  Administered 2020-08-21: 3 [IU] via SUBCUTANEOUS
  Administered 2020-08-21 – 2020-08-23 (×4): 2 [IU] via SUBCUTANEOUS
  Administered 2020-08-24: 3 [IU] via SUBCUTANEOUS

## 2020-08-12 MED ORDER — SENNA 8.6 MG PO TABS
1.0000 | ORAL_TABLET | Freq: Every day | ORAL | Status: DC
  Administered 2020-08-12 – 2020-08-22 (×8): 8.6 mg via ORAL
  Filled 2020-08-12 (×12): qty 1

## 2020-08-12 MED ORDER — INSULIN GLARGINE 100 UNIT/ML ~~LOC~~ SOLN
10.0000 [IU] | Freq: Every day | SUBCUTANEOUS | Status: DC
  Administered 2020-08-12 – 2020-08-24 (×13): 10 [IU] via SUBCUTANEOUS
  Filled 2020-08-12 (×13): qty 0.1

## 2020-08-12 MED ORDER — ENOXAPARIN SODIUM 40 MG/0.4ML ~~LOC~~ SOLN
40.0000 mg | SUBCUTANEOUS | Status: DC
  Administered 2020-08-12 – 2020-08-14 (×3): 40 mg via SUBCUTANEOUS
  Filled 2020-08-12 (×3): qty 0.4

## 2020-08-12 MED ORDER — INSULIN ASPART 100 UNIT/ML ~~LOC~~ SOLN
0.0000 [IU] | Freq: Every day | SUBCUTANEOUS | Status: DC
  Administered 2020-08-16 – 2020-08-17 (×2): 2 [IU] via SUBCUTANEOUS

## 2020-08-12 NOTE — Progress Notes (Addendum)
PROGRESS NOTE  Timothy [name]. IWO:[id number] DOB: [birth year] DOA: 08/11/2020 PCP: Rolm Gala, NP  HPI/Recap of past 24 hours: Timothy [name]. is a 60 y.o. male with medical history significant for hypertension, type 2 diabetes, chronic diastolic heart failure, history of osteomyelitis s/p amputation of the left great toe, GERD, PTSD, antisocial personality disorder, and hyperlipidemia who presents from [facility] ED for acute hypoxic respiratory failure secondary to possible left-sided empyema and the need for CT surgery consult at [facility].  Upon his presentation, he was in DKA for which insulin drip was started.  He is currently on Zosyn due to possible left-sided empyema.  08/12/2020: Reports pain in his lower abdomen 5 out of 10.  Assessment/Plan: Principal Problem:   Empyema (HCC) Active Problems:   Essential hypertension   Type 2 diabetes mellitus with hyperglycemia (HCC)   Respiratory failure with hypoxia (HCC)   Hyponatremia   Chronic diastolic CHF (congestive heart failure) (HCC)   Anemia   Abdominal pain  Type 2 diabetes with hyperglycemia Presented as a transfer from [facility] ED with glucose of 400.  No new chemistry panel was obtained today so unknown whether patient was in DKA. He was started on insulin drip on admission and transferred to progressive unit. No hydroxybutyrate acid obtained, no apparent metabolic acidosis Transition to subcu insulin, Lantus 10 units daily Starting a heart healthy carb modified diet Insulin sliding scale Hemoglobin A1c, 7.7 on 06/22/2020.  Acute hypoxic respiratory failure secondary to left-sided possible empyema Currently on IV Zosyn Seen by CTS. Maintain O2 saturation greater than 90%  Sepsis secondary to presumed left-sided empyema, POA. Presented with leukocytosis, tachypnea and presumed left-sided empyema seen on CT scan. WBC is downtrending Monitor fever curve. Blood cultures drawn on  08/10/2020 - to date. Continue to follow cultures. Followed by CTS Continue empiric IV antibiotics.  Lower abdominal pain  Pt noted to have significant tenderness to the entire left abdomen especially to the left lower quadrant.  He reports having changes in bowel movement consistency for the past 3 months ranging from constipation and  to diarrhea.  Denies having any weight loss but has actually gained significant amount of weight. Colonscopy 7/21 showed diverticulosis of the entire colon with internal hemorrhoids CT abdomen and pelvis with and without contrast done on 08/11/2020 revealed mild perinephric stranding slightly greater on the right than the left.,  Colonic diverticulosis without evidence of acute GI process. Pain management in place with bowel regimen.  Mild perinephric stranding seen on CT scan, unclear etiology UA negative for pyuria  Resolved hypovolemic hyponatremia Serum sodium 135  Normocytic anemia/iron deficiency anemia Baseline hemoglobin appears to be 12 Hemoglobin is downtrending No overt bleeding. Iron studies suggestive of iron deficiency. Consider treating once sepsis criteria has resolved.  Off start ferrous sulfate 325 mg twice daily Monitor H&H  HTN BP is currently at goal Continue home amlodipine and clonidine. Continue to monitor vital signs.  Chronic diastolic heart failure Euvolemic on exam Strict I's and O's and daily weight   Level of care: Progressive  Status is: Inpatient  Remains inpatient appropriate because:Inpatient level of care appropriate due to severity of illness   Dispo: The patient is from: Home  Anticipated d/c is to: Home on 08/15/2020.  Patient currently is not medically stable to d/c.              Difficult to place patient No    Code Status: Full code.  Family Communication: None  at bedside.  Disposition Plan: Likely will discharge to home.   Consultants:  Cardiothoracic  surgery.  Procedures:  None.  Antimicrobials:  Zosyn.  DVT prophylaxis: Subcu Lovenox daily  Status is: Inpatient    Dispo:  Patient From: Home  Planned Disposition: Home  Medically stable for discharge: No          Objective: Vitals:   08/11/20 2120 08/11/20 2337 08/12/20 0503 08/12/20 0751  BP: 138/82 132/79 129/77 113/76  Pulse: 85 84 77   Resp:   17 18  Temp: 97.8 F (36.6 C) 98.2 F (36.8 C) 98.5 F (36.9 C) 98.7 F (37.1 C)  TempSrc: Oral Oral Oral Oral  SpO2: 94% 96% 96% 95%  Weight: 93.3 kg     Height:  (1.778 m)       Intake/Output Summary (Last 24 hours) at 08/12/2020 1556 Last data filed at 08/12/2020 0447 Gross per 24 hour  Intake 517.9 ml  Output --  Net 517.9 ml   Filed Weights   08/11/20 2120  Weight: 93.3 kg    Exam:  . General: 60 y.o. year-old male well developed well nourished in no acute distress.  Alert and oriented x3. . Cardiovascular: Regular rate and rhythm with no rubs or gallops.  No thyromegaly or JVD noted.   Marland Kitchen Respiratory: Clear to auscultation with no wheezes or rales. Good inspiratory effort. . Abdomen: Soft tenderness with palpation lower abdomen.  Bowel sounds present.   . Musculoskeletal: No lower extremity edema bilaterally. . Skin: No ulcerative lesions noted or rashes, . Psychiatry: Mood is irritable.   Data Reviewed: CBC: Recent Labs  Lab 08/10/20 1842 08/11/20 1934 08/12/20 0218  WBC 13.5* 16.9* 14.9*  NEUTROABS 11.2*  --   --   HGB 11.9* 11.9* 10.9*  HCT 35.4* 35.0* 33.3*  MCV 95.4 95.4 96.2  PLT 427* 509* 474*   Basic Metabolic Panel: Recent Labs  Lab 08/11/20 1934 08/12/20 0218 08/12/20 0539 08/12/20 0950 08/12/20 1321  NA 129* 131* 133* 133* 135  K 4.9 4.1 3.9 3.9 4.0  CL 91* 93* 93* 94* 96*  CO2 GLUCOSE 423* 183* 166* 143* 165*  BUN 27* 22* CREATININE 1.04 0.86 0.76 0.81 0.91  CALCIUM 8.8* 8.7* 8.6* 8.5* 8.3*   GFR: Estimated Creatinine  Clearance: 101.5 mL/min (by C-G formula based on SCr of 0.91 mg/dL). Liver Function Tests: Recent Labs  Lab 08/10/20 1842  AST 17  ALT 18  ALKPHOS 139*  BILITOT 1.4*  PROT 7.4  ALBUMIN 2.6*   No results for input(s): LIPASE, AMYLASE in the last 168 hours. No results for input(s): AMMONIA in the last 168 hours. Coagulation Profile: No results for input(s): INR, PROTIME in the last 168 hours. Cardiac Enzymes: No results for input(s): CKTOTAL, CKMB, CKMBINDEX, TROPONINI in the last 168 hours. BNP (last 3 results) No results for input(s): PROBNP in the last 8760 hours. HbA1C: No results for input(s): HGBA1C in the last 72 hours. CBG: Recent Labs  Lab 08/12/20 0954 08/12/20 1057 08/12/20 1156 08/12/20 1410 08/12/20 1552  GLUCAP 142* 145* 149* 163* 142*   Lipid Profile: No results for input(s): CHOL, HDL, LDLCALC, TRIG, CHOLHDL, LDLDIRECT in the last 72 hours. Thyroid Function Tests: No results for input(s): TSH, T4TOTAL, FREET4, T3FREE, THYROIDAB in the last 72 hours. Anemia Panel: Recent Labs    08/12/20 0218  VITAMINB12 186  FOLATE 7.3  TIBC 230*  IRON 17*   Urine analysis:  Component Value Date/Time   COLORURINE YELLOW 08/12/2020 1330   APPEARANCEUR HAZY (A) 08/12/2020 1330   APPEARANCEUR Cloudy (A) 01/13/2020 1156   LABSPEC 1.024 08/12/2020 1330   PHURINE 5.0 08/12/2020 1330   GLUCOSEU NEGATIVE 08/12/2020 1330   HGBUR SMALL (A) 08/12/2020 1330   BILIRUBINUR NEGATIVE 08/12/2020 1330   BILIRUBINUR Negative 01/13/2020 1156   KETONESUR NEGATIVE 08/12/2020 1330   PROTEINUR NEGATIVE 08/12/2020 1330   NITRITE NEGATIVE 08/12/2020 1330   LEUKOCYTESUR NEGATIVE 08/12/2020 1330   Sepsis Labs: @LABRCNTIP (procalcitonin:4,lacticidven:4)  ) Recent Results (from the past 240 hour(s))  Blood culture (single)     Status: None (Preliminary result)   Collection Time: 08/10/20  6:42 PM   Specimen: BLOOD  Result Value Ref Range Status   Specimen Description BLOOD  RIGHT ANTECUBITAL  Final   Special Requests   Final    BOTTLES DRAWN AEROBIC AND ANAEROBIC Blood Culture adequate volume   Culture   Final    NO GROWTH 2 DAYS Performed at [facility], [address] [place], [place]    Report Status PENDING  Incomplete  Blood culture (single)     Status: None (Preliminary result)   Collection Time: 08/10/20  8:26 PM   Specimen: BLOOD  Result Value Ref Range Status   Specimen Description BLOOD BLOOD LEFT FOREARM  Final   Special Requests   Final    BOTTLES DRAWN AEROBIC AND ANAEROBIC Blood Culture results may not be optimal due to an excessive volume of blood received in culture bottles   Culture   Final    NO GROWTH 2 DAYS Performed at [facility], [address] [place], [place]    Report Status PENDING  Incomplete  Resp Panel by RT-PCR (Flu A&B, Covid) Nasopharyngeal Swab     Status: None   Collection Time: 08/10/20  8:26 PM   Specimen: Nasopharyngeal Swab; Nasopharyngeal(NP) swabs in vial transport medium  Result Value Ref Range Status   SARS Coronavirus 2 by RT PCR NEGATIVE NEGATIVE Final    Comment: (NOTE) SARS-CoV-2 target nucleic acids are NOT DETECTED.  The SARS-CoV-2 RNA is generally detectable in upper respiratory specimens during the acute phase of infection. The lowest concentration of SARS-CoV-2 viral copies this assay can detect is 138 copies/mL. A negative result does not preclude SARS-Cov-2 infection and should not be used as the sole basis for treatment or other patient management decisions. A negative result may occur with  improper specimen collection/handling, submission of specimen other than nasopharyngeal swab, presence of viral mutation(s) within the areas targeted by this assay, and inadequate number of viral copies(<138 copies/mL). A negative result must be combined with clinical observations, patient history, and epidemiological information. The expected result is  Negative.  Fact Sheet for Patients:  08/12/20  Fact Sheet for Healthcare Providers:  BloggerCourse.com  This test is no t yet approved or cleared by the [employer] FDA and  has been authorized for detection and/or diagnosis of SARS-CoV-2 by FDA under an Emergency Use Authorization (EUA). This EUA will remain  in effect (meaning this test can be used) for the duration of the COVID-19 declaration under Section 564(b)(1) of the Act, 21 U.S.C.section 360bbb-3(b)(1), unless the authorization is terminated  or revoked sooner.       Influenza A by PCR NEGATIVE NEGATIVE Final   Influenza B by PCR NEGATIVE NEGATIVE Final    Comment: (NOTE) The Xpert Xpress SARS-CoV-2/FLU/RSV plus assay is intended as an aid in the diagnosis of influenza  from Nasopharyngeal swab specimens and should not be used as a sole basis for treatment. Nasal washings and aspirates are unacceptable for Xpert Xpress SARS-CoV-2/FLU/RSV testing.  Fact Sheet for Patients: BloggerCourse.com  Fact Sheet for Healthcare Providers: SeriousBroker.it  This test is not yet approved or cleared by the [place] FDA and has been authorized for detection and/or diagnosis of SARS-CoV-2 by FDA under an Emergency Use Authorization (EUA). This EUA will remain in effect (meaning this test can be used) for the duration of the COVID-19 declaration under Section 564(b)(1) of the Act, 21 U.S.C. section 360bbb-3(b)(1), unless the authorization is terminated or revoked.  Performed at [facility], [address] [place], [place] [address]       Studies: CT ABDOMEN PELVIS W WO CONTRAST  Result Date: 08/12/2020 CLINICAL DATA:  LEFT lower quadrant abdominal pain in a 60 year old male EXAM: CT ABDOMEN AND PELVIS WITHOUT AND WITH CONTRAST TECHNIQUE: Multidetector CT imaging of the abdomen and pelvis was performed  following the standard protocol before and following the bolus administration of intravenous contrast. CONTRAST:  OMNIPAQUE IOHEXOL 300 MG/ML  SOLN COMPARISON:  CT angiography of the chest from August 10, 2020 FINDINGS: Lower chest: Signs of suspected empyema and LEFT lower lobe airspace disease better demonstrated on preceding CT of the chest. Hepatobiliary: No focal, suspicious hepatic lesion. The portal vein is patent. No pericholecystic stranding. Mildly limited assessment due to respiratory motion of all upper abdominal viscera. Sludge and/or small stones in the dependent gallbladder. Pancreas: Normal, without mass, inflammation or ductal dilatation. Spleen: Normal size spleen without focal lesion. Adrenals/Urinary Tract: Adrenal glands are normal. Symmetric renal enhancement. No hydronephrosis. Mild renal cortical scarring on the RIGHT. Mild perinephric stranding, slightly asymmetric along the upper pole the RIGHT kidney as compared to the LEFT. No visible lesion. Stomach/Bowel: No acute gastrointestinal process. Colonic diverticulosis of the sigmoid colon. Appendix not visualized though there are no secondary signs to suggest acute appendicitis. Vascular/Lymphatic: Calcified noncalcified atheromatous plaque in the abdominal aorta without aortic aneurysm. 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 There is no gastrohepatic or hepatoduodenal ligament lymphadenopathy. No retroperitoneal or mesenteric lymphadenopathy. No pelvic sidewall lymphadenopathy. Reproductive: Prostate and urinary bladder are grossly normal on CT. No perivesical stranding. Other: No free air.  No ascites. Musculoskeletal: Spinal  degenerative changes. No acute or destructive bone process. IMPRESSION: 1. Signs of suspected empyema and LEFT lower lobe airspace disease better demonstrated on prior CT of the chest. 2. Mild perinephric stranding slightly greater on the RIGHT than the LEFT. Correlate with urinalysis. No classic findings otherwise of pyelonephritis on the current study. 3. 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 4. Colonic diverticulosis without evidence of acute gastrointestinal process. 5. Aortic atherosclerosis. Aortic Atherosclerosis (ICD10-I70.0). Electronically Signed   By: Donzetta Kohut M.D.   On: 08/12/2020 08:09    Scheduled Meds: . amLODipine  5 mg Oral Daily  . citalopram  20 mg Oral Daily  . cloNIDine  0.1 mg Oral QHS  . gabapentin  300 mg Oral TID  . magnesium oxide  400 mg Oral Daily  . pantoprazole  40 mg Oral Daily  .  rosuvastatin  5 mg Oral Daily  . senna  1 tablet Oral Daily    Continuous Infusions: . dextrose 5% lactated ringers 125 mL/hr at 08/11/20 2335  . insulin 2.8 Units/hr (08/12/20 1412)  . lactated ringers Stopped (08/11/20 2302)  . piperacillin-tazobactam (ZOSYN)  IV 3.375 g (08/12/20 1202)     LOS: 1 day     Darlin Droparole N Obie Silos, MD [facility] Pager [phone]  If 7PM-7AM, please contact night-coverage [email or web address] Password [email or web address] 08/12/2020, 3:56 PM

## 2020-08-12 NOTE — Consult Note (Addendum)
[address]       [place] [address]             [phone]        Timothy [name]. [place] Record #[id number] Date of Birth: May 20, 1961  Referring: Timothy [name] Primary Care: Timothy Reusing, NP Primary Cardiologist:No primary care provider on file.  Chief Complaint:  Empyema  History of Present Illness:      Timothy [name] is a 60 yo male with history of HTN, Type 2 DM, Chronic Diastolic CHF, PTSD, antisocial personality disorder, and hyperlipidemia.  The patient presented to [name] with complaints of left sided chest pain and abdominal spasm.  He also complained of shortness of breath while trying to sleep and with exertion.  He had been experiencing coughing spells that were occasionally blood tinged.  He also experienced nausea, vomiting, and diarrhea.  He was unsure if he was experiencing fevers, but did experience chills.  He has also been fatigued as of late. Workup in the ED consisted of CTA of the chest which was negative for PE.  However, he was found to have a large left pleural fluid.  There was evidence of loculation and there was concern this could be an empyema.  Cardiac workup was negative.  He was started on IV Zosyn for antimicrobial coverage.  Also upon admission the patient was noted to have blood glucose level of >400 and he was started on insulin infusion.  Transfer was arranged to [name] for possible surgical intervention.  Currently the patient states he doesn't feel any better than on presentation.  He continues to have shortness of breath and chest discomfort.  He continues to smoke 1 pack per cigarettes per day.    Current Activity/ Functional Status: Patient is independent with mobility/ambulation, transfers, ADL's, IADL's.    Past Medical History:  Diagnosis Date  . CHF (congestive heart failure) ([place])   . Diabetes mellitus without complication ([place])   . Hypercholesteremia   . Hypertension     Past Surgical  History:  Procedure Laterality Date  . AMPUTATION Left 03/22/2020   Procedure: AMPUTATION RAY-1st Ray Left Foot;  Surgeon: [name], [name];  Location: [facility] ORS;  Service: Podiatry;  Laterality: Left;  . AMPUTATION TOE Left 03/16/2020   Procedure: AMPUTATION GREAT TOE;  Surgeon: [name], [name];  Location: [facility] ORS;  Service: Podiatry;  Laterality: Left;  . COLONOSCOPY WITH PROPOFOL N/A 12/22/2019   Procedure: COLONOSCOPY WITH PROPOFOL;  Surgeon: [name], MD;  Location: [facility] ENDOSCOPY;  Service: Endoscopy;  Laterality: N/A;  . LOWER EXTREMITY ANGIOGRAPHY Left 03/18/2020   Procedure: Lower Extremity Angiography;  Surgeon: [name], MD;  Location: [facility] CV LAB;  Service: Cardiovascular;  Laterality: Left;  . TONSILLECTOMY    . WISDOM TOOTH EXTRACTION      Social History   Tobacco Use  Smoking Status Current Every Day Smoker  . Packs/day: 2.00  . Years: 41.00  . Pack years: 82.00  . Types: Cigarettes  Smokeless Tobacco Never Used  Tobacco Comment   1PPD 07/28/2020    Social History   Substance and Sexual Activity  Alcohol Use Yes  . Alcohol/week: 6.0 standard drinks  . Types: 6 Cans of beer per week   Comment: occasionally      Allergies  Allergen Reactions  . Bee Venom Anaphylaxis  . Coffee Flavor Shortness Of Breath  . Cheese Swelling    Blue Cheese only  Current Facility-Administered Medications  Medication Dose Route Frequency Provider Last Rate Last Admin  . amLODipine (NORVASC) tablet 5 mg  5 mg Oral Daily Tu, Ching T, DO   5 mg at 08/12/20 0911  . citalopram (CELEXA) tablet 20 mg  20 mg Oral Daily Tu, Ching T, DO   20 mg at 08/12/20 0910  . cloNIDine (CATAPRES) tablet 0.1 mg  0.1 mg Oral QHS Tu, Ching T, DO   0.1 mg at 08/12/20 0005  . dextrose 5 % in lactated ringers infusion   Intravenous Continuous Ileene Musa T, DO 125 mL/hr at 08/11/20 2335 New Bag at 08/11/20 2335  . dextrose 50 % solution 0-50 mL  0-50 mL Intravenous PRN Tu, Ching T, DO       . gabapentin (NEURONTIN) capsule 300 mg  300 mg Oral TID Tu, Ching T, DO   300 mg at 08/12/20 0910  . insulin regular, human (MYXREDLIN) 100 units/ 100 mL infusion   Intravenous Continuous Tu, Ching T, DO 2.8 mL/hr at 08/12/20 1412 2.8 Units/hr at 08/12/20 1412  . lactated ringers infusion   Intravenous Continuous Tu, Royal Piedra T, DO   Stopped at 08/11/20 2302  . magnesium oxide (MAG-OX) tablet 400 mg  400 mg Oral Daily Tu, Ching T, DO   400 mg at 08/12/20 0911  . oxyCODONE (Oxy IR/ROXICODONE) immediate release tablet 5 mg  5 mg Oral Q4H PRN Irene Pap N, DO   5 mg at 08/12/20 1413  . pantoprazole (PROTONIX) EC tablet 40 mg  40 mg Oral Daily Tu, Ching T, DO   40 mg at 08/12/20 0910  . piperacillin-tazobactam (ZOSYN) IVPB 3.375 g  3.375 g Intravenous Q8H Tu, Ching T, DO 12.5 mL/hr at 08/12/20 1202 3.375 g at 08/12/20 1202  . rosuvastatin (CRESTOR) tablet 5 mg  5 mg Oral Daily Tu, Ching T, DO   5 mg at 08/12/20 0911  . senna (SENOKOT) tablet 8.6 mg  1 tablet Oral Daily Irene Pap N, DO   8.6 mg at 08/12/20 1413    Medications Prior to Admission  Medication Sig Dispense Refill Last Dose  . Acetaminophen (ARTHRITIS PAIN PO) Take 1,000 mg by mouth daily.     Marland Kitchen albuterol (VENTOLIN HFA) 108 (90 Base) MCG/ACT inhaler Inhale 1 puff into the lungs every 6 (six) hours as needed for wheezing or shortness of breath. 18 g 11   . amLODipine (NORVASC) 5 MG tablet Take 1 tablet (5 mg total) by mouth daily. 90 tablet 1   . blood glucose meter kit and supplies KIT Dispense based on patient and insurance preference. Use up to four times daily as directed. (FOR ICD-9 250.00, 250.01). 1 each 0   . calcium carbonate (TUMS - DOSED IN MG ELEMENTAL CALCIUM) 500 MG chewable tablet Chew by mouth.     . citalopram (CELEXA) 20 MG tablet TAKE ONE TABLET BY MOUTH EVERY DAY (Patient taking differently: Take 20 mg by mouth daily.) 30 tablet 0   . cloNIDine (CATAPRES) 0.1 MG tablet TAKE ONE TABLET BY MOUTH AT BEDTIME (Patient  taking differently: Take 0.1 mg by mouth at bedtime.) 56 tablet 0   . clopidogrel (PLAVIX) 75 MG tablet Take 1 tablet (75 mg total) by mouth daily with breakfast. 30 tablet 2   . esomeprazole (NEXIUM) 20 MG capsule Take 1 capsule (20 mg total) by mouth daily at 12 noon. 30 capsule 3   . gabapentin (NEURONTIN) 300 MG capsule Take 1 capsule (300 mg total) by mouth 3 (  three) times daily. 90 capsule 2   . glipiZIDE (GLUCOTROL) 5 MG tablet Take 10 mg in the morning and 5 mg in the evening 90 tablet 2   . Magnesium 400 MG CAPS Take 400 mg by mouth daily.     . metFORMIN (GLUCOPHAGE) 1000 MG tablet Take 1 tablet (1,000 mg total) by mouth 2 (two) times daily with a meal. 60 tablet 3   . rosuvastatin (CRESTOR) 5 MG tablet Take 1 tablet (5 mg total) by mouth daily. 30 tablet 3   . sacubitril-valsartan (ENTRESTO) 24-26 MG Take 1 tablet by mouth 2 (two) times daily. 60 tablet 3   . sacubitril-valsartan (ENTRESTO) 24-26 MG Take 1 tablet by mouth 2 (two) times daily. 180 tablet 3   . triamcinolone (KENALOG) 0.025 % ointment Apply 1 application topically 2 (two) times daily. To buttocks 60 g 2     Family History  Family history unknown: Yes   Review of Systems:   ROS    Cardiac Review of Systems: Y or  [    ]= no  Chest Pain [  Y  ]  Resting SOB [   ] Exertional SOB  [Y  ]  Orthopnea [  ]   Pedal Edema Aqua.Slicker   ]    Palpitations Aqua.Slicker  ] Syncope  [  ]   Presyncope [   ]  General Review of Systems: [Y] = yes [  ]=no Constitional: recent weight change [  ]; anorexia [  ]; fatigue [Y  ]; nausea [Y  ]; night sweats [  ]; fever [  ]; or chills [ Y ]                                                               Dental: Last Dentist visit:   Eye : blurred vision [  ]; diplopia [   ]; vision changes [  ];  Amaurosis fugax[  ]; Resp: cough [Y];  wheezing[  ];  hemoptysis[  ]; shortness of breath[  ]; paroxysmal nocturnal dyspnea[Y  ]; dyspnea on exertion[Y  ]; or orthopnea[  ];  GI:  gallstones[  ], vomiting[Y  ];   dysphagia[  ]; melena[  ];  hematochezia [  ]; heartburn[  ];   Hx of  Colonoscopy[  ]; GU: kidney stones [  ]; hematuria[  ];   dysuria [  ];  nocturia[  ];  history of     obstruction [  ]; urinary frequency [  ]             Skin: rash, swelling[ N ];, hair loss[  ];  peripheral edema[ N ];  or itching[  ]; Musculosketetal: myalgias[  ];  joint swelling[  ];  joint erythema[  ];  joint pain[  ];  back pain[  ];  Heme/Lymph: bruising[  ];  bleeding[  ];  anemia[  ];  Neuro: TIA[  ];  headaches[  ];  stroke[  ];  vertigo[  ];  seizures[  ];   paresthesias[  ];  difficulty walking[  ];  Psych:depression[ Y ]; anxiety[  ];  Endocrine: diabetes[Y  ];  thyroid dysfunction[  ];  Physical Exam: BP 113/76   Pulse 77   Temp 98.7 F (37.1  C) (Oral)   Resp 18   Ht _0  (1.778 m)   Wt 93.3 kg   SpO2 95%   BMI 29.51 kg/m    General appearance: alert, cooperative, no distress and flat affect Head: Normocephalic, without obvious abnormality, atraumatic Resp: diminished breath sounds on left, CTA right Cardio: regular rate and rhythm GI: soft, non-tender; bowel sounds normal; no masses,  no organomegaly Extremities: extremities normal, atraumatic, no cyanosis or edema and amputation of toe on left foot Neurologic: Grossly normal  Diagnostic Studies & Laboratory data:     Recent Radiology Findings:   CT ABDOMEN PELVIS W WO CONTRAST  Result Date: 08/12/2020 CLINICAL DATA:  LEFT lower quadrant abdominal pain in a 60 year old male EXAM: CT ABDOMEN AND PELVIS WITHOUT AND WITH CONTRAST TECHNIQUE: Multidetector CT imaging of the abdomen and pelvis was performed following the standard protocol before and following the bolus administration of intravenous contrast. CONTRAST:  157m OMNIPAQUE IOHEXOL 300 MG/ML  SOLN COMPARISON:  CT angiography of the chest from August 10, 2020 FINDINGS: Lower chest: Signs of suspected empyema and LEFT lower lobe airspace disease better demonstrated on preceding CT of  the chest. Hepatobiliary: No focal, suspicious hepatic lesion. The portal vein is patent. No pericholecystic stranding. Mildly limited assessment due to respiratory motion of all upper abdominal viscera. Sludge and/or small stones in the dependent gallbladder. Pancreas: Normal, without mass, inflammation or ductal dilatation. Spleen: Normal size spleen without focal lesion. Adrenals/Urinary Tract: Adrenal glands are normal. Symmetric renal enhancement. No hydronephrosis. Mild renal cortical scarring on the RIGHT. Mild perinephric stranding, slightly asymmetric along the upper pole the RIGHT kidney as compared to the LEFT. No visible lesion. Stomach/Bowel: No acute gastrointestinal process. Colonic diverticulosis of the sigmoid colon. Appendix not visualized though there are no secondary signs to suggest acute appendicitis. Vascular/Lymphatic: Calcified noncalcified atheromatous plaque in the abdominal aorta without aortic aneurysm. 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 There is no gastrohepatic or hepatoduodenal ligament lymphadenopathy. No retroperitoneal or mesenteric lymphadenopathy. No pelvic sidewall lymphadenopathy. Reproductive: Prostate and urinary bladder are grossly normal on CT. No perivesical stranding. Other: No free air.  No ascites. Musculoskeletal: Spinal degenerative changes. No acute or destructive bone process. IMPRESSION: 1. Signs of suspected empyema and LEFT lower lobe airspace disease better demonstrated on prior CT of the chest. 2. Mild perinephric stranding slightly greater on the RIGHT than the LEFT. Correlate with urinalysis. No classic findings otherwise of pyelonephritis on the current  study. 3. 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 4. Colonic diverticulosis without evidence of acute gastrointestinal process. 5. Aortic atherosclerosis. Aortic Atherosclerosis (ICD10-I70.0). Electronically Signed   By: GZetta BillsM.D.   On: 08/12/2020 08:09   CT Angio Chest PE W and/or Wo Contrast  Result Date: 08/10/2020 CLINICAL DATA:  Positive D-dimer. Poor oxygen saturation. Abnormal chest radiography. EXAM: CT ANGIOGRAPHY CHEST WITH CONTRAST TECHNIQUE: Multidetector CT imaging of the chest was performed using the standard protocol during bolus administration of intravenous contrast. Multiplanar CT image reconstructions and MIPs were obtained to evaluate the vascular anatomy. CONTRAST:  1077mOMNIPAQUE IOHEXOL 350 MG/ML SOLN COMPARISON:  Chest radiography same day.  CT  11/03/2019 FINDINGS: Cardiovascular: Heart size is normal. No pericardial fluid. There is some coronary artery calcification. There is aortic atherosclerotic calcification. Pulmonary arterial opacification is good. There are no pulmonary emboli. Mediastinum/Nodes: Mild reactive nodal prominence in the mediastinum. No dominant node. Lungs/Pleura: Right chest is clear. No infiltrate, effusion or collapse. On the left, there is a large amount of pleural fluid with multiple loculations worrisome for empyema. There is considerable volume loss in the adjacent lung, particularly affecting the left lower lobe. Upper Abdomen: No significant upper abdominal finding. Musculoskeletal: Ordinary mild thoracic degenerative changes. Review of the MIP images confirms the above findings. IMPRESSION: 1. No pulmonary emboli. 2. Large amount of pleural fluid on the left with multiple loculations worrisome for empyema. Volume loss in the adjacent lung, particularly affecting the left lower lobe. 3. Aortic atherosclerosis. Coronary artery calcification. Aortic  Atherosclerosis (ICD10-I70.0). Electronically Signed   By: Nelson Chimes M.D.   On: 08/10/2020 20:18   DG Chest Portable 1 View  Result Date: 08/10/2020 CLINICAL DATA:  Short of breath.  Chest pain. EXAM: PORTABLE CHEST 1 VIEW COMPARISON:  03/15/2020 FINDINGS: There is a moderate to large left pleural effusion with significantly diminished aeration to the left lower and left mid lung. Veil like opacification of the left upper lung is also noted. Right lung appears clear. Visualized osseous structures are unremarkable. IMPRESSION: Moderate to large left pleural effusion with significantly diminished aeration to the left lower and left mid lung. This is new when compared with the previous exam. Electronically Signed   By: Kerby Moors M.D.   On: 08/10/2020 19:58     I have independently reviewed the above radiologic studies and discussed with the patient   Recent Lab Findings: Lab Results  Component Value Date   WBC 14.9 (H) 08/12/2020   HGB 10.9 (L) 08/12/2020   HCT 33.3 (L) 08/12/2020   PLT 474 (H) 08/12/2020   GLUCOSE 143 (H) 08/12/2020   CHOL 167 06/22/2020   TRIG 335 (H) 06/22/2020   HDL 44 06/22/2020   LDLCALC 70 06/22/2020   ALT 18 08/10/2020   AST 17 08/10/2020   NA 133 (L) 08/12/2020   K 3.9 08/12/2020   CL 94 (L) 08/12/2020   CREATININE 0.81 08/12/2020   BUN 17 08/12/2020   CO2 25 08/12/2020   TSH 1.720 10/28/2019   INR 0.9 03/16/2020   HGBA1C 7.7 (H) 06/22/2020    Assessment / Plan:      1. Large left pleural effusion, CT scan suggestive of possible empyema- patient has been treated with Zosyn, he is afebrile, mild leukocytosis which has been stable 2. DM 3. Nicotine Abuse 4. PTSD/Antisocial personality disorder 5. Chronic diastolic CHF 6. Dispo- patient stable large left pleural effusion, currently on IV Zosyn.. Dr. Kipp Brood is aware of patient.  He will evaluate for possible IR chest tube placement vs. VATS procedure  I  spent 55 minutes counseling the patient  face to face.   Erin Barrett, PA-C 08/12/2020 2:22 PM   Agree with above. This is a 60 year old gentleman that presents with a large loculated left-sided pleural effusion. I will review of his images he does have a dominant collection but given the loculations he likely will benefit most from a VATS procedure. I will tentatively be scheduled for Monday for a left VATS decortication.  Harlean Regula Bary Leriche

## 2020-08-12 NOTE — Progress Notes (Signed)
Inpatient Diabetes Program Recommendations  AACE/ADA: New Consensus Statement on Inpatient Glycemic Control   Target Ranges:  Prepandial:   less than 140 mg/dL      Peak postprandial:   less than 180 mg/dL (1-2 hours)      Critically ill patients:  140 - 180 mg/dL   Results for Timothy [name], Timothy [name] (MRN [id number]) as of 08/12/2020 08:08  Ref. Range 08/12/2020 01:01 08/12/2020 02:01 08/12/2020 03:03 08/12/2020 05:01 08/12/2020 06:57  Glucose-Capillary Latest Ref Range: 70 - 99 mg/dL 032 (H) 122 (H) 482 (H) 160 (H) 163 (H)  Results for Timothy [name], Timothy [name] (MRN [id number]) as of 08/12/2020 08:08  Ref. Range 08/11/2020 18:30 08/11/2020 20:19 08/11/2020 21:31 08/11/2020 22:01 08/11/2020 23:01 08/11/2020 23:56  Glucose-Capillary Latest Ref Range: 70 - 99 mg/dL 891 (H) 694 (H) 503 (H) 338 (H) 134 (H) 92  Results for Timothy [name], Timothy [name] (MRN [id number]) as of 08/12/2020 08:08  Ref. Range 08/10/2020 18:42 08/10/2020 20:22  Glucose Latest Ref Range: 70 - 99 mg/dL 917 (H)    Solumedrol 915 mg   Results for Timothy [name], Timothy [name] (MRN [id number]) as of 08/12/2020 08:08  Ref. Range 06/22/2020 10:58  Hemoglobin A1C Latest Ref Range: 4.8 - 5.6 % 7.7 (H)   Review of Glycemic Control  Diabetes history: DM2 Outpatient Diabetes medications: Glipizide 10 mg QAM, Glipizide 5 mg QPM, Metformin 1000 mg BID Current orders for Inpatient glycemic control: IV insulin  Inpatient Diabetes Program Recommendations:    Insulin: Once MD is ready to transition from IV to SQ insulin, please consider ordering Lantus 10 units Q24H, CBGs Q4H, and Novolog 0-15 units Q4H.  NOTE: Initial glucose 243 mg/dl on 1/65/53 at 74:82. Noted patient received Solumedrol 125 mg/dl at 70:78 on 6/75/44. No glucose checked again until 22 hours later which resulted with finger sick of 400 mg/dl at 92:01 on 0/07/12. Patient was started on IV insulin on 08/11/20 around 21:36 and has remained on IV insulin since then.   Thanks, Orlando Penner, RN, MSN,  CDE Diabetes Coordinator Inpatient Diabetes Program [phone] (Team Pager from 8am to 5pm)

## 2020-08-13 LAB — COMPREHENSIVE METABOLIC PANEL
ALT: 17 U/L (ref 0–44)
AST: 17 U/L (ref 15–41)
Albumin: 1.8 g/dL — ABNORMAL LOW (ref 3.5–5.0)
Alkaline Phosphatase: 102 U/L (ref 38–126)
Anion gap: 9 (ref 5–15)
BUN: 12 mg/dL (ref 6–20)
CO2: 28 mmol/L (ref 22–32)
Calcium: 8.2 mg/dL — ABNORMAL LOW (ref 8.9–10.3)
Chloride: 96 mmol/L — ABNORMAL LOW (ref 98–111)
Creatinine, Ser: 0.92 mg/dL (ref 0.61–1.24)
GFR, Estimated: >60 mL/min (ref >60)
Glucose, Bld: 154 mg/dL — ABNORMAL HIGH (ref 70–99)
Potassium: 4 mmol/L (ref 3.5–5.1)
Sodium: 133 mmol/L — ABNORMAL LOW (ref 135–145)
Total Bilirubin: 0.7 mg/dL (ref 0.3–1.2)
Total Protein: 5.8 g/dL — ABNORMAL LOW (ref 6.5–8.1)

## 2020-08-13 LAB — CBC WITH DIFFERENTIAL/PLATELET
Abs Immature Granulocytes: 0.13 10*3/uL — ABNORMAL HIGH (ref 0.00–0.07)
Basophils Absolute: 0 10*3/uL (ref 0.0–0.1)
Basophils Relative: 0 %
Eosinophils Absolute: 0.1 10*3/uL (ref 0.0–0.5)
Eosinophils Relative: 1 %
HCT: 33.5 % — ABNORMAL LOW (ref 39.0–52.0)
Hemoglobin: 11 g/dL — ABNORMAL LOW (ref 13.0–17.0)
Immature Granulocytes: 1 %
Lymphocytes Relative: 14 %
Lymphs Abs: 1.4 10*3/uL (ref 0.7–4.0)
MCH: 32 pg (ref 26.0–34.0)
MCHC: 32.8 g/dL (ref 30.0–36.0)
MCV: 97.4 fL (ref 80.0–100.0)
Monocytes Absolute: 1 10*3/uL (ref 0.1–1.0)
Monocytes Relative: 11 %
Neutro Abs: 7.3 10*3/uL (ref 1.7–7.7)
Neutrophils Relative %: 73 %
Platelets: 453 10*3/uL — ABNORMAL HIGH (ref 150–400)
RBC: 3.44 MIL/uL — ABNORMAL LOW (ref 4.22–5.81)
RDW: 13.2 % (ref 11.5–15.5)
WBC: 9.9 10*3/uL (ref 4.0–10.5)
nRBC: 0 % (ref 0.0–0.2)

## 2020-08-13 LAB — GLUCOSE, CAPILLARY
Glucose-Capillary: 119 mg/dL — ABNORMAL HIGH (ref 70–99)
Glucose-Capillary: 129 mg/dL — ABNORMAL HIGH (ref 70–99)
Glucose-Capillary: 133 mg/dL — ABNORMAL HIGH (ref 70–99)
Glucose-Capillary: 134 mg/dL — ABNORMAL HIGH (ref 70–99)
Glucose-Capillary: 137 mg/dL — ABNORMAL HIGH (ref 70–99)
Glucose-Capillary: 141 mg/dL — ABNORMAL HIGH (ref 70–99)
Glucose-Capillary: 147 mg/dL — ABNORMAL HIGH (ref 70–99)
Glucose-Capillary: 148 mg/dL — ABNORMAL HIGH (ref 70–99)
Glucose-Capillary: 148 mg/dL — ABNORMAL HIGH (ref 70–99)
Glucose-Capillary: 148 mg/dL — ABNORMAL HIGH (ref 70–99)
Glucose-Capillary: 169 mg/dL — ABNORMAL HIGH (ref 70–99)

## 2020-08-13 MED ORDER — ONDANSETRON HCL 4 MG/2ML IJ SOLN
4.0000 mg | Freq: Four times a day (QID) | INTRAMUSCULAR | Status: DC | PRN
  Administered 2020-08-13: 4 mg via INTRAVENOUS
  Filled 2020-08-13: qty 2

## 2020-08-13 MED ORDER — BENZONATATE 100 MG PO CAPS
200.0000 mg | ORAL_CAPSULE | Freq: Three times a day (TID) | ORAL | Status: DC | PRN
  Administered 2020-08-13 – 2020-08-14 (×2): 200 mg via ORAL
  Filled 2020-08-13 (×2): qty 2

## 2020-08-13 NOTE — Progress Notes (Signed)
[facility]  PROGRESS NOTE  Timothy [name]. IHK:[id number] DOB: [birth year] DOA: 08/11/2020 PCP: Rolm Gala, NP   Brief HPI:   60 year old male with history of hypertension, diabetes mellitus type 2, chronic diastolic heart failure, osteomyelitis s/p amputation of left great toe in October 2021, GERD, PTSD, antisocial personality disorder, hyperlipidemia presented from [facility] ED for acute hypoxemic respiratory failure secondary to possible left-sided empyema and need for CT surgery consult at [facility].  Upon presentation at [place] was found to be in DKA and insulin drip was started.  Patient was started on IV Zosyn for left-sided empyema.    Subjective   Patient seen and examined, complains of coughing spells.  Denies chest pain or shortness of breath.   Assessment/Plan:     1. Acute hypoxemic respiratory failure-secondary to left-sided empyema.  Patient started on IV Zosyn.  CT surgery has seen the patient, plan for VATS procedure on Monday.  Patient is currently on oxygen 3 L/min, continue oxygen to maintain O2 sats more than 90%.  Will start Tessalon Perles for coughing spells. 2. Diabetes mellitus type 2-patient was started on IV insulin as he presented with glucose of 400.  At this time anion gap has resolved.  Patient will be continued on Lantus 10 units subcu daily along with sliding scale insulin with NovoLog.  Continue carb modified diet.  Will discontinue IV insulin.  Hemoglobin A1c is 7.7 as of 06/22/2020. 3. Sepsis secondary to empyema-patient presented with leukocytosis, tachypnea, left side empyema seen on CT scan.  WBC was elevated.  Blood culture obtained on 08/07/2020 are negative to date.  Continue empiric antibiotics.  Sepsis physiology has resolved. 4. Perinephric stranding seen on CT scan-CT abdomen pelvis showed right more than left perinephric stranding, no classic finding of pyelonephritis on study.  UA is clear.  Unclear etiology.   Patient has already been started on IV Zosyn as above.  Will need repeat imaging in 3 to 4 weeks. 5. Left sided abdominal pain-patient complains of pain in the left lower and upper quadrant.  Likely referred pain from left-sided empyema.  CT done pelvis shows mild perinephric stranding bilaterally. 6. Hypertension-blood pressure is stable, continue multipin, clonidine. 7. Iron deficiency anemia-iron level 17, saturation 7%.  Patient will benefit from iron supplementation at discharge.  8. Chronic diastolic CHF-patient is currently euvolemic.     COVID-19 Labs  Recent Labs    08/10/20 1842  DDIMER 6.34*    Lab Results  Component Value Date   SARSCOV2NAA NEGATIVE 08/10/2020   SARSCOV2NAA NEGATIVE 06/15/2020   SARSCOV2NAA NEGATIVE 03/15/2020   SARSCOV2NAA NEGATIVE 12/18/2019     Scheduled medications:   . amLODipine  5 mg Oral Daily  . citalopram  20 mg Oral Daily  . cloNIDine  0.1 mg Oral QHS  . enoxaparin (LOVENOX) injection  40 mg Subcutaneous Q24H  . gabapentin  300 mg Oral TID  . insulin aspart  0-15 Units Subcutaneous TID WC  . insulin aspart  0-5 Units Subcutaneous QHS  . insulin glargine  10 Units Subcutaneous Daily  . magnesium oxide  400 mg Oral Daily  . pantoprazole  40 mg Oral Daily  . rosuvastatin  5 mg Oral Daily  . senna  1 tablet Oral Daily         CBG: Recent Labs  Lab 08/13/20 0428 08/13/20 0548 08/13/20 0645 08/13/20 0756 08/13/20 1229  GLUCAP 148* 134* 133* 148* 119*    SpO2: 94 % O2 Flow Rate (L/min):  3 L/min    CBC: Recent Labs  Lab 08/10/20 1842 08/11/20 1934 08/12/20 0218 08/13/20 0226  WBC 13.5* 16.9* 14.9* 9.9  NEUTROABS 11.2*  --   --  7.3  HGB 11.9* 11.9* 10.9* 11.0*  HCT 35.4* 35.0* 33.3* 33.5*  MCV 95.4 95.4 96.2 97.4  PLT 427* 509* 474* 453*    Basic Metabolic Panel: Recent Labs  Lab 08/12/20 0950 08/12/20 1321 08/12/20 1757 08/12/20 2223 08/13/20 0226  NA 133* 135 134* 134* 133*  K 3.9 4.0 3.9 4.1 4.0   CL 94* 96* 96* 96* 96*  CO2 25 28 28 27 28   GLUCOSE 143* 165* 151* 136* 154*  BUN 17 15 14 13 12   CREATININE 0.81 0.91 0.78 1.04 0.92  CALCIUM 8.5* 8.3* 8.3* 8.4* 8.2*     Liver Function Tests: Recent Labs  Lab 08/10/20 1842 08/13/20 0226  AST 17 17  ALT 18 17  ALKPHOS 139* 102  BILITOT 1.4* 0.7  PROT 7.4 5.8*  ALBUMIN 2.6* 1.8*     Antibiotics: Anti-infectives (From admission, onward)   Start     Dose/Rate Route Frequency Ordered Stop   08/11/20 2100  piperacillin-tazobactam (ZOSYN) IVPB 3.375 g        3.375 g 12.5 mL/hr over 240 Minutes Intravenous Every 8 hours 08/11/20 1930         DVT prophylaxis: Lovenox  Code Status: Full code  Family Communication: No family at bedside   Consultants:  CT surgery  Procedures:      Objective   Vitals:   08/13/20 0004 08/13/20 0427 08/13/20 0814 08/13/20 1231  BP: 125/84 131/77 (!) 160/85 131/80  Pulse: 75 77 79 76  Resp: 19 18 18 19   Temp: 98.7 F (37.1 C) 99.4 F (37.4 C)  98.4 F (36.9 C)  TempSrc: Oral     SpO2: 95% 92% 92% 94%  Weight:      Height:        Intake/Output Summary (Last 24 hours) at 08/13/2020 1249 Last data filed at 08/13/2020 0459 Gross per 24 hour  Intake 1944.17 ml  Output --  Net 1944.17 ml    02/24 1901 - 02/26 0700 In: 2462.1 [P.O.:200; I.V.:2074.6] Out: Ceasar Mons-   Filed Weights   08/11/20 2120  Weight: 93.3 kg    Physical Examination:    General: Appears in no acute distress  Cardiovascular: S1-S2, regular, no murmur auscultated  Respiratory: Decreased breath sounds in left upper and lower lung fields  Abdomen: Abdomen is soft, nontender, no organomegaly  Extremities: No edema in the lower extremities noted  Neurologic: Alert, oriented x3, intact insight and judgment   Status is: Inpatient  Dispo: The patient is from: Home              Anticipated d/c is to: Home              Anticipated d/c date is: August 20, 2020              Patient currently not stable  for discharge  Barrier to discharge-awaiting definitive treatment for empyema, likely VATS procedure on Monday            Data Reviewed:   Recent Results (from the past 240 hour(s))  Blood culture (single)     Status: None (Preliminary result)   Collection Time: 08/10/20  6:42 PM   Specimen: BLOOD  Result Value Ref Range Status   Specimen Description BLOOD RIGHT ANTECUBITAL  Final   Special Requests  Final    BOTTLES DRAWN AEROBIC AND ANAEROBIC Blood Culture adequate volume   Culture   Final    NO GROWTH 3 DAYS Performed at [facility], [address] [place], [place] [address]    Report Status PENDING  Incomplete  Blood culture (single)     Status: None (Preliminary result)   Collection Time: 08/10/20  8:26 PM   Specimen: BLOOD  Result Value Ref Range Status   Specimen Description BLOOD BLOOD LEFT FOREARM  Final   Special Requests   Final    BOTTLES DRAWN AEROBIC AND ANAEROBIC Blood Culture results may not be optimal due to an excessive volume of blood received in culture bottles   Culture   Final    NO GROWTH 3 DAYS Performed at [facility], [address] [place], [place] [address]    Report Status PENDING  Incomplete  Resp Panel by RT-PCR (Flu A&B, Covid) Nasopharyngeal Swab     Status: None   Collection Time: 08/10/20  8:26 PM   Specimen: Nasopharyngeal Swab; Nasopharyngeal(NP) swabs in vial transport medium  Result Value Ref Range Status   SARS Coronavirus 2 by RT PCR NEGATIVE NEGATIVE Final    Comment: (NOTE) SARS-CoV-2 target nucleic acids are NOT DETECTED.  The SARS-CoV-2 RNA is generally detectable in upper respiratory specimens during the acute phase of infection. The lowest concentration of SARS-CoV-2 viral copies this assay can detect is 138 copies/mL. A negative result does not preclude SARS-Cov-2 infection and should not be used as the sole basis for treatment or other patient management decisions. A negative result may  occur with  improper specimen collection/handling, submission of specimen other than nasopharyngeal swab, presence of viral mutation(s) within the areas targeted by this assay, and inadequate number of viral copies(<138 copies/mL). A negative result must be combined with clinical observations, patient history, and epidemiological information. The expected result is Negative.  Fact Sheet for Patients:  BloggerCourse.com  Fact Sheet for Healthcare Providers:  SeriousBroker.it  This test is no t yet approved or cleared by the [place] FDA and  has been authorized for detection and/or diagnosis of SARS-CoV-2 by FDA under an Emergency Use Authorization (EUA). This EUA will remain  in effect (meaning this test can be used) for the duration of the COVID-19 declaration under Section 564(b)(1) of the Act, 21 U.S.C.section 360bbb-3(b)(1), unless the authorization is terminated  or revoked sooner.       Influenza A by PCR NEGATIVE NEGATIVE Final   Influenza B by PCR NEGATIVE NEGATIVE Final    Comment: (NOTE) The Xpert Xpress SARS-CoV-2/FLU/RSV plus assay is intended as an aid in the diagnosis of influenza from Nasopharyngeal swab specimens and should not be used as a sole basis for treatment. Nasal washings and aspirates are unacceptable for Xpert Xpress SARS-CoV-2/FLU/RSV testing.  Fact Sheet for Patients: BloggerCourse.com  Fact Sheet for Healthcare Providers: SeriousBroker.it  This test is not yet approved or cleared by the [place] FDA and has been authorized for detection and/or diagnosis of SARS-CoV-2 by FDA under an Emergency Use Authorization (EUA). This EUA will remain in effect (meaning this test can be used) for the duration of the COVID-19 declaration under Section 564(b)(1) of the Act, 21 U.S.C. section 360bbb-3(b)(1), unless the authorization is terminated  or revoked.  Performed at [facility], [address] [place], [place] [address]     No results for input(s): LIPASE, AMYLASE in the last 168 hours. No results for input(s): AMMONIA in the last  168 hours.  Cardiac Enzymes: No results for input(s): CKTOTAL, CKMB, CKMBINDEX, TROPONINI in the last 168 hours. BNP (last 3 results) Recent Labs    08/10/20 1842  BNP 222.7*    ProBNP (last 3 results) No results for input(s): PROBNP in the last 8760 hours.  Studies:  CT ABDOMEN PELVIS W WO CONTRAST  Result Date: 08/12/2020 CLINICAL DATA:  LEFT lower quadrant abdominal pain in a 60 year old male EXAM: CT ABDOMEN AND PELVIS WITHOUT AND WITH CONTRAST TECHNIQUE: Multidetector CT imaging of the abdomen and pelvis was performed following the standard protocol before and following the bolus administration of intravenous contrast. CONTRAST:  OMNIPAQUE IOHEXOL 300 MG/ML  SOLN COMPARISON:  CT angiography of the chest from August 10, 2020 FINDINGS: Lower chest: Signs of suspected empyema and LEFT lower lobe airspace disease better demonstrated on preceding CT of the chest. Hepatobiliary: No focal, suspicious hepatic lesion. The portal vein is patent. No pericholecystic stranding. Mildly limited assessment due to respiratory motion of all upper abdominal viscera. Sludge and/or small stones in the dependent gallbladder. Pancreas: Normal, without mass, inflammation or ductal dilatation. Spleen: Normal size spleen without focal lesion. Adrenals/Urinary Tract: Adrenal glands are normal. Symmetric renal enhancement. No hydronephrosis. Mild renal cortical scarring on the RIGHT. Mild perinephric stranding, slightly asymmetric along the upper pole the RIGHT kidney as compared to the LEFT. No visible lesion. Stomach/Bowel: No acute gastrointestinal process. Colonic diverticulosis of the sigmoid colon. Appendix not visualized though there are no secondary signs to suggest acute appendicitis.  Vascular/Lymphatic: Calcified noncalcified atheromatous plaque in the abdominal aorta without aortic aneurysm. 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 There is no gastrohepatic or hepatoduodenal ligament lymphadenopathy. No retroperitoneal or mesenteric lymphadenopathy. No pelvic sidewall lymphadenopathy. Reproductive: Prostate and urinary bladder are grossly normal on CT. No perivesical stranding. Other: No free air.  No ascites. Musculoskeletal: Spinal degenerative changes. No acute or destructive bone process. IMPRESSION: 1. Signs of suspected empyema and LEFT lower lobe airspace disease better demonstrated on prior CT of the chest. 2. Mild perinephric stranding slightly greater on the RIGHT than the LEFT. Correlate with urinalysis. No classic findings otherwise of pyelonephritis on the current study. 3. 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 4. Colonic diverticulosis without evidence of acute gastrointestinal process. 5. Aortic atherosclerosis. Aortic Atherosclerosis (ICD10-I70.0). Electronically Signed   By: Donzetta Kohut M.D.   On: 08/12/2020 08:09       Timothy [name]   [facility] If 7PM-7AM, please contact night-coverage at [email or web address], Office  [phone]   08/13/2020, 12:49 PM  LOS: 2 days

## 2020-08-13 NOTE — Progress Notes (Signed)
Paged MD something for nausea.

## 2020-08-14 LAB — TYPE AND SCREEN
ABO/RH(D): A POS
Antibody Screen: NEGATIVE

## 2020-08-14 LAB — GLUCOSE, CAPILLARY
Glucose-Capillary: 139 mg/dL — ABNORMAL HIGH (ref 70–99)
Glucose-Capillary: 145 mg/dL — ABNORMAL HIGH (ref 70–99)
Glucose-Capillary: 166 mg/dL — ABNORMAL HIGH (ref 70–99)
Glucose-Capillary: 184 mg/dL — ABNORMAL HIGH (ref 70–99)

## 2020-08-14 LAB — PROTIME-INR
INR: 1.2 (ref 0.8–1.2)
Prothrombin Time: 14.7 seconds (ref 11.4–15.2)

## 2020-08-14 LAB — ABO/RH: ABO/RH(D): A POS

## 2020-08-14 LAB — SURGICAL PCR SCREEN
MRSA, PCR: NEGATIVE
Staphylococcus aureus: NEGATIVE

## 2020-08-14 MED ORDER — MUPIROCIN 2 % EX OINT: 1.0000 "application " | TOPICAL_OINTMENT | Freq: Two times a day (BID) | CUTANEOUS | Status: DC

## 2020-08-14 MED ORDER — GUAIFENESIN ER 600 MG PO TB12
1200.0000 mg | ORAL_TABLET | Freq: Two times a day (BID) | ORAL | Status: DC
  Administered 2020-08-14 – 2020-08-24 (×21): 1200 mg via ORAL
  Filled 2020-08-14 (×21): qty 2

## 2020-08-14 NOTE — Progress Notes (Signed)
Dr. Rachael Darby returned page.  New order received.

## 2020-08-14 NOTE — Progress Notes (Signed)
     [address]       [place] [address]             [phone]       No events Vitals:   08/14/20 0515 08/14/20 0918  BP: 125/72 133/78  Pulse: 81   Resp: 20 19  Temp: 98.9 F (37.2 C) 98.7 F (37.1 C)  SpO2: 92% 94%   Alert NAD Regular EWOB on [place]   L empyema. OR tomorrow for L VATS decortication  Corliss Skains

## 2020-08-14 NOTE — Plan of Care (Signed)

## 2020-08-14 NOTE — Progress Notes (Signed)
[facility]  PROGRESS NOTE  Timothy Sill. KNL:[id number] DOB: 07-24-1960 DOA: 08/11/2020 PCP: Rolm Gala, NP   Brief HPI:   60 year old male with history of hypertension, diabetes mellitus type 2, chronic diastolic heart failure, osteomyelitis s/p amputation of left great toe in October 2021, GERD, PTSD, antisocial personality disorder, hyperlipidemia presented from [facility] ED for acute hypoxemic respiratory failure secondary to possible left-sided empyema and need for CT surgery consult at [facility].  Upon presentation at [place] was found to be in DKA and insulin drip was started.  Patient was started on IV Zosyn for left-sided empyema.    Subjective   Patient seen and examined, denies any complaints.  Cough better after starting as needed Tessalon Perles.   Assessment/Plan:     1. Acute hypoxemic respiratory failure-secondary to left-sided empyema.  Patient started on IV Zosyn.  CT surgery has seen the patient, plan for VATS procedure on Monday.  Patient is currently on oxygen 3 L/min, continue oxygen to maintain O2 sats more than 90%.  Continue Tessalon Perles for as needed coughing.   2. Diabetes mellitus type 2-patient was started on IV insulin as he presented with glucose of 400.  At this time anion gap has resolved.  Patient will be continued on Lantus 10 units subcu daily along with sliding scale insulin with NovoLog.  Continue carb modified diet.  Will discontinue IV insulin.  Hemoglobin A1c is 7.7 as of 06/22/2020. 3. Sepsis secondary to empyema-patient presented with leukocytosis, tachypnea, left side empyema seen on CT scan.  WBC was elevated.  Blood culture obtained on 08/07/2020 are negative to date.  Continue empiric antibiotics.  Sepsis physiology has resolved. 4. Perinephric stranding seen on CT scan-CT abdomen pelvis showed right more than left perinephric stranding, no classic finding of pyelonephritis on study.  UA is clear.  Unclear  etiology.  Patient has already been started on IV Zosyn as above.  Will need repeat imaging in 3 to 4 weeks. 5. Left sided abdominal pain-patient complains of pain in the left lower and upper quadrant.  Likely referred pain from left-sided empyema.  CT done pelvis shows mild perinephric stranding bilaterally. 6. Hypertension-blood pressure is stable, continue multipin, clonidine. 7. Iron deficiency anemia-iron level 17, saturation 7%.  Patient will benefit from iron supplementation at discharge.  8. Chronic diastolic CHF-patient is currently euvolemic.     COVID-19 Labs  No results for input(s): DDIMER, FERRITIN, LDH, CRP in the last 72 hours.  Lab Results  Component Value Date   SARSCOV2NAA NEGATIVE 08/10/2020   SARSCOV2NAA NEGATIVE 06/15/2020   SARSCOV2NAA NEGATIVE 03/15/2020   SARSCOV2NAA NEGATIVE 12/18/2019     Scheduled medications:   . amLODipine  5 mg Oral Daily  . citalopram  20 mg Oral Daily  . cloNIDine  0.1 mg Oral QHS  . enoxaparin (LOVENOX) injection  40 mg Subcutaneous Q24H  . gabapentin  300 mg Oral TID  . guaiFENesin  1,200 mg Oral BID  . insulin aspart  0-15 Units Subcutaneous TID WC  . insulin aspart  0-5 Units Subcutaneous QHS  . insulin glargine  10 Units Subcutaneous Daily  . magnesium oxide  400 mg Oral Daily  . mupirocin ointment  1 application Nasal BID  . pantoprazole  40 mg Oral Daily  . rosuvastatin  5 mg Oral Daily  . senna  1 tablet Oral Daily         CBG: Recent Labs  Lab 08/13/20 0756 08/13/20 1229 08/13/20 1646 08/13/20  2051 08/14/20 0732  GLUCAP 148* 119* 169* 129* 139*    SpO2: 94 % O2 Flow Rate (L/min): 3 L/min    CBC: Recent Labs  Lab 08/10/20 1842 08/11/20 1934 08/12/20 0218 08/13/20 0226  WBC 13.5* 16.9* 14.9* 9.9  NEUTROABS 11.2*  --   --  7.3  HGB 11.9* 11.9* 10.9* 11.0*  HCT 35.4* 35.0* 33.3* 33.5*  MCV 95.4 95.4 96.2 97.4  PLT 427* 509* 474* 453*    Basic Metabolic Panel: Recent Labs  Lab  08/12/20 0950 08/12/20 1321 08/12/20 1757 08/12/20 2223 08/13/20 0226  NA 133* 135 134* 134* 133*  K 3.9 4.0 3.9 4.1 4.0  CL 94* 96* 96* 96* 96*  CO2 25 28 28 27 28   GLUCOSE 143* 165* 151* 136* 154*  BUN 17 15 14 13 12   CREATININE 0.81 0.91 0.78 1.04 0.92  CALCIUM 8.5* 8.3* 8.3* 8.4* 8.2*     Liver Function Tests: Recent Labs  Lab 08/10/20 1842 08/13/20 0226  AST 17 17  ALT 18 17  ALKPHOS 139* 102  BILITOT 1.4* 0.7  PROT 7.4 5.8*  ALBUMIN 2.6* 1.8*     Antibiotics: Anti-infectives (From admission, onward)   Start     Dose/Rate Route Frequency Ordered Stop   08/11/20 2100  piperacillin-tazobactam (ZOSYN) IVPB 3.375 g        3.375 g 12.5 mL/hr over 240 Minutes Intravenous Every 8 hours 08/11/20 1930         DVT prophylaxis: Lovenox  Code Status: Full code  Family Communication: No family at bedside   Consultants:  CT surgery  Procedures:      Objective   Vitals:   08/13/20 1645 08/13/20 2032 08/14/20 0515 08/14/20 0918  BP: 128/81 (!) 144/84 125/72 133/78  Pulse: 74  81 83  Resp: 18 20 20 19   Temp: 98.5 F (36.9 C) 99.1 F (37.3 C) 98.9 F (37.2 C) 98.7 F (37.1 C)  TempSrc:  Oral Oral Oral  SpO2: 93% 94% 92% 94%  Weight:      Height:        Intake/Output Summary (Last 24 hours) at 08/14/2020 1213 Last data filed at 08/13/2020 2127 Gross per 24 hour  Intake 220 ml  Output 2200 ml  Net -1980 ml    02/25 1901 - 02/27 0700 In: 1964.2 [P.O.:220; I.V.:1594.2] Out: 2200 [Urine:2200]  Filed Weights   08/11/20 2120  Weight: 93.3 kg    Physical Examination:    General-appears in no acute distress  Heart-S1-S2, regular, no murmur auscultated  Lungs-decreased breath sounds in left lung fields  Abdomen-soft, nontender, no organomegaly  Extremities-no edema in the lower extremities  Neuro-alert, oriented x3, no focal deficit noted   Status is: Inpatient  Dispo: The patient is from: Home              Anticipated d/c is to:  Home              Anticipated d/c date is: August 20, 2020              Patient currently not stable for discharge  Barrier to discharge-awaiting definitive treatment for empyema, likely VATS procedure on Monday            Data Reviewed:   Recent Results (from the past 240 hour(s))  Blood culture (single)     Status: None (Preliminary result)   Collection Time: 08/10/20  6:42 PM   Specimen: BLOOD  Result Value Ref Range Status   Specimen  Description BLOOD RIGHT ANTECUBITAL  Final   Special Requests   Final    BOTTLES DRAWN AEROBIC AND ANAEROBIC Blood Culture adequate volume   Culture   Final    NO GROWTH 4 DAYS Performed at [facility], [address] [place], [place] [address]    Report Status PENDING  Incomplete  Blood culture (single)     Status: None (Preliminary result)   Collection Time: 08/10/20  8:26 PM   Specimen: BLOOD  Result Value Ref Range Status   Specimen Description BLOOD BLOOD LEFT FOREARM  Final   Special Requests   Final    BOTTLES DRAWN AEROBIC AND ANAEROBIC Blood Culture results may not be optimal due to an excessive volume of blood received in culture bottles   Culture   Final    NO GROWTH 4 DAYS Performed at [facility], [address], [place], [place] [address]    Report Status PENDING  Incomplete  Resp Panel by RT-PCR (Flu A&B, Covid) Nasopharyngeal Swab     Status: None   Collection Time: 08/10/20  8:26 PM   Specimen: Nasopharyngeal Swab; Nasopharyngeal(NP) swabs in vial transport medium  Result Value Ref Range Status   SARS Coronavirus 2 by RT PCR NEGATIVE NEGATIVE Final    Comment: (NOTE) SARS-CoV-2 target nucleic acids are NOT DETECTED.  The SARS-CoV-2 RNA is generally detectable in upper respiratory specimens during the acute phase of infection. The lowest concentration of SARS-CoV-2 viral copies this assay can detect is 138 copies/mL. A negative result does not preclude SARS-Cov-2 infection and should not be  used as the sole basis for treatment or other patient management decisions. A negative result may occur with  improper specimen collection/handling, submission of specimen other than nasopharyngeal swab, presence of viral mutation(s) within the areas targeted by this assay, and inadequate number of viral copies(<138 copies/mL). A negative result must be combined with clinical observations, patient history, and epidemiological information. The expected result is Negative.  Fact Sheet for Patients:  BloggerCourse.com  Fact Sheet for Healthcare Providers:  SeriousBroker.it  This test is no t yet approved or cleared by the [place] FDA and  has been authorized for detection and/or diagnosis of SARS-CoV-2 by FDA under an Emergency Use Authorization (EUA). This EUA will remain  in effect (meaning this test can be used) for the duration of the COVID-19 declaration under Section 564(b)(1) of the Act, 21 U.S.C.section 360bbb-3(b)(1), unless the authorization is terminated  or revoked sooner.       Influenza A by PCR NEGATIVE NEGATIVE Final   Influenza B by PCR NEGATIVE NEGATIVE Final    Comment: (NOTE) The Xpert Xpress SARS-CoV-2/FLU/RSV plus assay is intended as an aid in the diagnosis of influenza from Nasopharyngeal swab specimens and should not be used as a sole basis for treatment. Nasal washings and aspirates are unacceptable for Xpert Xpress SARS-CoV-2/FLU/RSV testing.  Fact Sheet for Patients: BloggerCourse.com  Fact Sheet for Healthcare Providers: SeriousBroker.it  This test is not yet approved or cleared by the [place] FDA and has been authorized for detection and/or diagnosis of SARS-CoV-2 by FDA under an Emergency Use Authorization (EUA). This EUA will remain in effect (meaning this test can be used) for the duration of the COVID-19 declaration under Section  564(b)(1) of the Act, 21 U.S.C. section 360bbb-3(b)(1), unless the authorization is terminated or revoked.  Performed at [facility], [address] [place], [place] [address]     No results for input(s): LIPASE, AMYLASE in  the last 168 hours. No results for input(s): AMMONIA in the last 168 hours.  Cardiac Enzymes: No results for input(s): CKTOTAL, CKMB, CKMBINDEX, TROPONINI in the last 168 hours. BNP (last 3 results) Recent Labs    08/10/20 1842  BNP 222.7*    ProBNP (last 3 results) No results for input(s): PROBNP in the last 8760 hours.  Studies:  No results found.     Meredeth IdeGagan S Providence Stivers   [facility] If 7PM-7AM, please contact night-coverage at [email or web address], Office  [phone]   08/14/2020, 12:13 PM  LOS: 3 days

## 2020-08-15 ENCOUNTER — Inpatient Hospital Stay (HOSPITAL_COMMUNITY): Payer: Medicaid Other | Admitting: Certified Registered"

## 2020-08-15 ENCOUNTER — Encounter (HOSPITAL_COMMUNITY)
Admission: AD | Disposition: A | Discharge status: Home Health | Payer: Self-pay | Source: Other Acute Inpatient Hospital | Attending: Family Medicine

## 2020-08-15 ENCOUNTER — Inpatient Hospital Stay (HOSPITAL_COMMUNITY): Payer: Medicaid Other

## 2020-08-15 DIAGNOSIS — J90 Pleural effusion, not elsewhere classified: Secondary | ICD-10-CM

## 2020-08-15 DIAGNOSIS — J869 Pyothorax without fistula: Secondary | ICD-10-CM | POA: Diagnosis present

## 2020-08-15 HISTORY — PX: VIDEO ASSISTED THORACOSCOPY (VATS)/DECORTICATION: SHX6171

## 2020-08-15 LAB — CBC
HCT: 33.7 % — ABNORMAL LOW (ref 39.0–52.0)
Hemoglobin: 10.9 g/dL — ABNORMAL LOW (ref 13.0–17.0)
MCH: 31.4 pg (ref 26.0–34.0)
MCHC: 32.3 g/dL (ref 30.0–36.0)
MCV: 97.1 fL (ref 80.0–100.0)
Platelets: 476 10*3/uL — ABNORMAL HIGH (ref 150–400)
RBC: 3.47 MIL/uL — ABNORMAL LOW (ref 4.22–5.81)
RDW: 13.2 % (ref 11.5–15.5)
WBC: 11.6 10*3/uL — ABNORMAL HIGH (ref 4.0–10.5)
nRBC: 0 % (ref 0.0–0.2)

## 2020-08-15 LAB — CULTURE, BLOOD (SINGLE)
Culture: NO GROWTH
Culture: NO GROWTH
Special Requests: ADEQUATE

## 2020-08-15 LAB — GLUCOSE, CAPILLARY
Glucose-Capillary: 185 mg/dL — ABNORMAL HIGH (ref 70–99)
Glucose-Capillary: 160 mg/dL — ABNORMAL HIGH (ref 70–99)
Glucose-Capillary: 164 mg/dL — ABNORMAL HIGH (ref 70–99)
Glucose-Capillary: 179 mg/dL — ABNORMAL HIGH (ref 70–99)

## 2020-08-15 LAB — BASIC METABOLIC PANEL
Anion gap: 9 (ref 5–15)
BUN: 9 mg/dL (ref 6–20)
CO2: 29 mmol/L (ref 22–32)
Calcium: 8.4 mg/dL — ABNORMAL LOW (ref 8.9–10.3)
Chloride: 94 mmol/L — ABNORMAL LOW (ref 98–111)
Creatinine, Ser: 0.84 mg/dL (ref 0.61–1.24)
GFR, Estimated: >60 mL/min (ref >60)
Glucose, Bld: 216 mg/dL — ABNORMAL HIGH (ref 70–99)
Potassium: 4.3 mmol/L (ref 3.5–5.1)
Sodium: 132 mmol/L — ABNORMAL LOW (ref 135–145)

## 2020-08-15 LAB — BLOOD GAS, ARTERIAL
Acid-Base Excess: 2.7 mmol/L — ABNORMAL HIGH (ref 0.0–2.0)
Bicarbonate: 27.8 mmol/L (ref 20.0–28.0)
Drawn by: 44475
FIO2: 44
O2 Saturation: 88.2 %
Patient temperature: 36.6
pCO2 arterial: 51.3 mmHg — ABNORMAL HIGH (ref 32.0–48.0)
pH, Arterial: 7.351 (ref 7.350–7.450)
pO2, Arterial: 57.5 mmHg — ABNORMAL LOW (ref 83.0–108.0)

## 2020-08-15 SURGERY — VIDEO ASSISTED THORACOSCOPY (VATS)/DECORTICATION
Anesthesia: General | Laterality: Left

## 2020-08-15 MED ORDER — PHENYLEPHRINE 40 MCG/ML (10ML) SYRINGE FOR IV PUSH (FOR BLOOD PRESSURE SUPPORT)
PREFILLED_SYRINGE | INTRAVENOUS | Status: AC
  Filled 2020-08-15: qty 10

## 2020-08-15 MED ORDER — NALOXONE HCL 0.4 MG/ML IJ SOLN
INTRAMUSCULAR | Status: AC
  Filled 2020-08-15: qty 1

## 2020-08-15 MED ORDER — NALOXONE HCL 0.4 MG/ML IJ SOLN
INTRAMUSCULAR | Status: DC | PRN
  Administered 2020-08-15 (×2): 40 ug via INTRAVENOUS

## 2020-08-15 MED ORDER — MIDAZOLAM HCL 2 MG/2ML IJ SOLN
INTRAMUSCULAR | Status: AC
  Filled 2020-08-15: qty 2

## 2020-08-15 MED ORDER — ACETAMINOPHEN 500 MG PO TABS
1000.0000 mg | ORAL_TABLET | Freq: Four times a day (QID) | ORAL | Status: AC
  Administered 2020-08-15 – 2020-08-20 (×19): 1000 mg via ORAL
  Filled 2020-08-15 (×20): qty 2

## 2020-08-15 MED ORDER — ALBUTEROL SULFATE (2.5 MG/3ML) 0.083% IN NEBU: 2.5000 mg | INHALATION_SOLUTION | Freq: Two times a day (BID) | RESPIRATORY_TRACT | Status: DC

## 2020-08-15 MED ORDER — TRAMADOL HCL 50 MG PO TABS
50.0000 mg | ORAL_TABLET | Freq: Four times a day (QID) | ORAL | Status: DC | PRN
  Administered 2020-08-15: 50 mg via ORAL
  Administered 2020-08-17: 100 mg via ORAL
  Administered 2020-08-17: 50 mg via ORAL
  Administered 2020-08-19 – 2020-08-21 (×4): 100 mg via ORAL
  Administered 2020-08-22: 50 mg via ORAL
  Administered 2020-08-23 – 2020-08-24 (×3): 100 mg via ORAL
  Filled 2020-08-15 (×2): qty 2
  Filled 2020-08-15 (×2): qty 1
  Filled 2020-08-15 (×2): qty 2
  Filled 2020-08-15: qty 1
  Filled 2020-08-15 (×4): qty 2

## 2020-08-15 MED ORDER — ONDANSETRON HCL 4 MG/2ML IJ SOLN: 4.0000 mg | Freq: Four times a day (QID) | INTRAMUSCULAR | Status: DC | PRN

## 2020-08-15 MED ORDER — SENNOSIDES-DOCUSATE SODIUM 8.6-50 MG PO TABS: 1.0000 | ORAL_TABLET | Freq: Every day | ORAL | Status: DC

## 2020-08-15 MED ORDER — LIDOCAINE 2% (20 MG/ML) 5 ML SYRINGE
INTRAMUSCULAR | Status: AC
  Filled 2020-08-15: qty 5

## 2020-08-15 MED ORDER — BISACODYL 5 MG PO TBEC
10.0000 mg | DELAYED_RELEASE_TABLET | Freq: Every day | ORAL | Status: DC
  Administered 2020-08-15 – 2020-08-18 (×5): 10 mg via ORAL
  Filled 2020-08-15 (×9): qty 2

## 2020-08-15 MED ORDER — FENTANYL CITRATE (PF) 100 MCG/2ML IJ SOLN
INTRAMUSCULAR | Status: AC
  Filled 2020-08-15: qty 2

## 2020-08-15 MED ORDER — ALBUTEROL SULFATE (2.5 MG/3ML) 0.083% IN NEBU
2.5000 mg | INHALATION_SOLUTION | RESPIRATORY_TRACT | Status: DC
  Administered 2020-08-15: 2.5 mg via RESPIRATORY_TRACT
  Filled 2020-08-15: qty 3

## 2020-08-15 MED ORDER — PHENYLEPHRINE HCL (PRESSORS) 10 MG/ML IV SOLN
INTRAVENOUS | Status: DC | PRN
  Administered 2020-08-15: 120 ug via INTRAVENOUS

## 2020-08-15 MED ORDER — ONDANSETRON HCL 4 MG/2ML IJ SOLN
INTRAMUSCULAR | Status: AC
  Filled 2020-08-15: qty 2

## 2020-08-15 MED ORDER — KETOROLAC TROMETHAMINE 30 MG/ML IJ SOLN
INTRAMUSCULAR | Status: DC | PRN
  Administered 2020-08-15: 30 mg via INTRAVENOUS

## 2020-08-15 MED ORDER — ACETAMINOPHEN 500 MG PO TABS: 1000.0000 mg | ORAL_TABLET | Freq: Once | ORAL | Status: DC | PRN

## 2020-08-15 MED ORDER — ONDANSETRON HCL 4 MG/2ML IJ SOLN
INTRAMUSCULAR | Status: DC | PRN
  Administered 2020-08-15: 4 mg via INTRAVENOUS

## 2020-08-15 MED ORDER — MIDAZOLAM HCL 2 MG/2ML IJ SOLN
INTRAMUSCULAR | Status: DC | PRN
  Administered 2020-08-15: 2 mg via INTRAVENOUS

## 2020-08-15 MED ORDER — ROCURONIUM BROMIDE 10 MG/ML (PF) SYRINGE
PREFILLED_SYRINGE | INTRAVENOUS | Status: AC
  Filled 2020-08-15: qty 10

## 2020-08-15 MED ORDER — FENTANYL CITRATE (PF) 100 MCG/2ML IJ SOLN
25.0000 ug | INTRAMUSCULAR | Status: DC | PRN
  Administered 2020-08-15 (×3): 50 ug via INTRAVENOUS

## 2020-08-15 MED ORDER — ACETAMINOPHEN 160 MG/5ML PO SOLN: 1000.0000 mg | Freq: Once | ORAL | Status: DC | PRN

## 2020-08-15 MED ORDER — ALBUTEROL SULFATE HFA 108 (90 BASE) MCG/ACT IN AERS
INHALATION_SPRAY | RESPIRATORY_TRACT | Status: DC | PRN
  Administered 2020-08-15 (×5): 2 via RESPIRATORY_TRACT

## 2020-08-15 MED ORDER — FENTANYL CITRATE (PF) 250 MCG/5ML IJ SOLN
INTRAMUSCULAR | Status: AC
  Filled 2020-08-15: qty 5

## 2020-08-15 MED ORDER — OXYCODONE HCL 5 MG/5ML PO SOLN: 5.0000 mg | Freq: Once | ORAL | Status: DC | PRN

## 2020-08-15 MED ORDER — ROCURONIUM BROMIDE 10 MG/ML (PF) SYRINGE
PREFILLED_SYRINGE | INTRAVENOUS | Status: DC | PRN
  Administered 2020-08-15: 90 mg via INTRAVENOUS
  Administered 2020-08-15: 10 mg via INTRAVENOUS

## 2020-08-15 MED ORDER — ACETAMINOPHEN 160 MG/5ML PO SOLN: 1000.0000 mg | Freq: Four times a day (QID) | ORAL | Status: AC

## 2020-08-15 MED ORDER — PROPOFOL 10 MG/ML IV BOLUS
INTRAVENOUS | Status: DC | PRN
  Administered 2020-08-15: 80 mg via INTRAVENOUS

## 2020-08-15 MED ORDER — LACTATED RINGERS IV SOLN: INTRAVENOUS | Status: DC

## 2020-08-15 MED ORDER — AMISULPRIDE (ANTIEMETIC) 5 MG/2ML IV SOLN
5.0000 mg | Freq: Once | INTRAVENOUS | Status: AC
  Administered 2020-08-15: 10 mg via INTRAVENOUS
  Filled 2020-08-15 (×2): qty 2

## 2020-08-15 MED ORDER — 0.9 % SODIUM CHLORIDE (POUR BTL) OPTIME
TOPICAL | Status: DC | PRN
  Administered 2020-08-15: 5000 mL

## 2020-08-15 MED ORDER — FENTANYL CITRATE (PF) 250 MCG/5ML IJ SOLN
INTRAMUSCULAR | Status: DC | PRN
  Administered 2020-08-15 (×2): 100 ug via INTRAVENOUS
  Administered 2020-08-15: 50 ug via INTRAVENOUS

## 2020-08-15 MED ORDER — SUGAMMADEX SODIUM 200 MG/2ML IV SOLN
INTRAVENOUS | Status: DC | PRN
  Administered 2020-08-15 (×2): 200 mg via INTRAVENOUS

## 2020-08-15 MED ORDER — BUPIVACAINE LIPOSOME 1.3 % IJ SUSP
20.0000 mL | Freq: Once | INTRAMUSCULAR | Status: DC
  Filled 2020-08-15: qty 20

## 2020-08-15 MED ORDER — DEXAMETHASONE SODIUM PHOSPHATE 10 MG/ML IJ SOLN
INTRAMUSCULAR | Status: AC
  Filled 2020-08-15: qty 1

## 2020-08-15 MED ORDER — NICOTINE 21 MG/24HR TD PT24
21.0000 mg | MEDICATED_PATCH | Freq: Every day | TRANSDERMAL | Status: DC
  Administered 2020-08-15 – 2020-08-24 (×10): 21 mg via TRANSDERMAL
  Filled 2020-08-15 (×10): qty 1

## 2020-08-15 MED ORDER — LIDOCAINE 2% (20 MG/ML) 5 ML SYRINGE
INTRAMUSCULAR | Status: DC | PRN
  Administered 2020-08-15: 60 mg via INTRAVENOUS

## 2020-08-15 MED ORDER — CHLORHEXIDINE GLUCONATE 0.12 % MT SOLN
15.0000 mL | Freq: Once | OROMUCOSAL | Status: AC
  Administered 2020-08-16: 15 mL via OROMUCOSAL
  Filled 2020-08-15 (×2): qty 15

## 2020-08-15 MED ORDER — ACETAMINOPHEN 10 MG/ML IV SOLN: 1000.0000 mg | Freq: Once | INTRAVENOUS | Status: DC | PRN

## 2020-08-15 MED ORDER — BUPIVACAINE HCL (PF) 0.5 % IJ SOLN
INTRAMUSCULAR | Status: AC
  Filled 2020-08-15: qty 30

## 2020-08-15 MED ORDER — EPHEDRINE 5 MG/ML INJ
INTRAVENOUS | Status: AC
  Filled 2020-08-15: qty 10

## 2020-08-15 MED ORDER — PROPOFOL 10 MG/ML IV BOLUS
INTRAVENOUS | Status: AC
  Filled 2020-08-15: qty 20

## 2020-08-15 MED ORDER — HYDROMORPHONE HCL 1 MG/ML IJ SOLN
INTRAMUSCULAR | Status: AC
  Filled 2020-08-15: qty 1

## 2020-08-15 MED ORDER — BUPIVACAINE HCL (PF) 0.5 % IJ SOLN
INTRAMUSCULAR | Status: DC | PRN
  Administered 2020-08-15: 30 mL

## 2020-08-15 MED ORDER — KETOROLAC TROMETHAMINE 30 MG/ML IJ SOLN
INTRAMUSCULAR | Status: AC
  Filled 2020-08-15: qty 1

## 2020-08-15 MED ORDER — PROTAMINE SULFATE 10 MG/ML IV SOLN
INTRAVENOUS | Status: AC
  Filled 2020-08-15: qty 5

## 2020-08-15 MED ORDER — OXYCODONE HCL 5 MG PO TABS: 5.0000 mg | ORAL_TABLET | Freq: Once | ORAL | Status: DC | PRN

## 2020-08-15 MED ORDER — KETOROLAC TROMETHAMINE 15 MG/ML IJ SOLN
15.0000 mg | Freq: Four times a day (QID) | INTRAMUSCULAR | Status: AC
  Administered 2020-08-15 – 2020-08-17 (×8): 15 mg via INTRAVENOUS
  Filled 2020-08-15 (×8): qty 1

## 2020-08-15 MED ORDER — HYDROMORPHONE HCL 1 MG/ML IJ SOLN
INTRAMUSCULAR | Status: DC | PRN
  Administered 2020-08-15: 1 mg via INTRAVENOUS

## 2020-08-15 SURGICAL SUPPLY — 69 items
BLADE CLIPPER SURG (BLADE) IMPLANT
CANISTER SUCT 3000ML PPV (MISCELLANEOUS) ×2 IMPLANT
CATH THORACIC 28FR (CATHETERS) ×2 IMPLANT
CHLORAPREP W/TINT 26 (MISCELLANEOUS) ×2 IMPLANT
CLEANER TIP ELECTROSURG 2X2 (MISCELLANEOUS) IMPLANT
CNTNR URN SCR LID CUP LEK RST (MISCELLANEOUS) ×2 IMPLANT
CONN ST 1/4X3/8  BEN (MISCELLANEOUS)
CONN ST 1/4X3/8 BEN (MISCELLANEOUS) IMPLANT
CONN Y 3/8X3/8X3/8  BEN (MISCELLANEOUS)
CONN Y 3/8X3/8X3/8 BEN (MISCELLANEOUS) IMPLANT
CONT SPEC 4OZ STRL OR WHT (MISCELLANEOUS) ×2
COVER SURGICAL LIGHT HANDLE (MISCELLANEOUS) IMPLANT
DEFOGGER ANTIFOG KIT (MISCELLANEOUS) ×2 IMPLANT
DEFOGGER SCOPE WARMER CLEARIFY (MISCELLANEOUS) ×2 IMPLANT
DERMABOND ADVANCED (GAUZE/BANDAGES/DRESSINGS) ×1
DERMABOND ADVANCED .7 DNX12 (GAUZE/BANDAGES/DRESSINGS) ×1 IMPLANT
DISSECTOR BLUNT TIP ENDO 5MM (MISCELLANEOUS) IMPLANT
ELECT BLADE 4.0 EZ CLEAN MEGAD (MISCELLANEOUS) ×2
ELECT REM PT RETURN 9FT ADLT (ELECTROSURGICAL) ×2
ELECTRODE BLDE 4.0 EZ CLN MEGD (MISCELLANEOUS) ×1 IMPLANT
ELECTRODE REM PT RTRN 9FT ADLT (ELECTROSURGICAL) ×1 IMPLANT
GAUZE SPONGE 4X4 12PLY STRL (GAUZE/BANDAGES/DRESSINGS) ×2 IMPLANT
GAUZE SPONGE 4X4 12PLY STRL LF (GAUZE/BANDAGES/DRESSINGS) ×2 IMPLANT
GLOVE BIO SURGEON STRL SZ7.5 (GLOVE) ×4 IMPLANT
GOWN STRL REUS W/ TWL LRG LVL3 (GOWN DISPOSABLE) ×3 IMPLANT
GOWN STRL REUS W/ TWL XL LVL3 (GOWN DISPOSABLE) ×1 IMPLANT
GOWN STRL REUS W/TWL LRG LVL3 (GOWN DISPOSABLE) ×3
GOWN STRL REUS W/TWL XL LVL3 (GOWN DISPOSABLE) ×1
KIT BASIN OR (CUSTOM PROCEDURE TRAY) ×2 IMPLANT
KIT SUCTION CATH 14FR (SUCTIONS) ×2 IMPLANT
KIT TURNOVER KIT B (KITS) ×2 IMPLANT
NEEDLE 18GX1X1/2 (RX/OR ONLY) (NEEDLE) ×2 IMPLANT
NS IRRIG 1000ML POUR BTL (IV SOLUTION) ×4 IMPLANT
PACK CHEST (CUSTOM PROCEDURE TRAY) ×2 IMPLANT
PACK UNIVERSAL I (CUSTOM PROCEDURE TRAY) ×2 IMPLANT
PAD ARMBOARD 7.5X6 YLW CONV (MISCELLANEOUS) ×4 IMPLANT
PASSER SUT SWANSON 36MM LOOP (INSTRUMENTS) IMPLANT
SCISSORS LAP 5X35 DISP (ENDOMECHANICALS) IMPLANT
STOPCOCK 4 WAY LG BORE MALE ST (IV SETS) IMPLANT
SUT PROLENE 3 0 SH DA (SUTURE) IMPLANT
SUT PROLENE 4 0 RB 1 (SUTURE)
SUT PROLENE 4-0 RB1 .5 CRCL 36 (SUTURE) IMPLANT
SUT SILK  1 MH (SUTURE) ×1
SUT SILK 1 MH (SUTURE) ×1 IMPLANT
SUT SILK 1 TIES 10X30 (SUTURE) IMPLANT
SUT SILK 2 0SH CR/8 30 (SUTURE) IMPLANT
SUT VIC AB 1 CTX 36 (SUTURE)
SUT VIC AB 1 CTX36XBRD ANBCTR (SUTURE) IMPLANT
SUT VIC AB 2-0 CT1 27 (SUTURE) ×1
SUT VIC AB 2-0 CT1 TAPERPNT 27 (SUTURE) ×1 IMPLANT
SUT VIC AB 2-0 UR6 27 (SUTURE) ×2 IMPLANT
SUT VIC AB 3-0 SH 27 (SUTURE) ×1
SUT VIC AB 3-0 SH 27X BRD (SUTURE) ×1 IMPLANT
SUT VIC AB 3-0 X1 27 (SUTURE) IMPLANT
SUT VICRYL 2 TP 1 (SUTURE) IMPLANT
SWAB COLLECTION DEVICE MRSA (MISCELLANEOUS) IMPLANT
SWAB CULTURE ESWAB REG 1ML (MISCELLANEOUS) IMPLANT
SYR 20ML LL LF (SYRINGE) IMPLANT
SYR 50ML LL SCALE MARK (SYRINGE) IMPLANT
SYSTEM SAHARA CHEST DRAIN ATS (WOUND CARE) ×2 IMPLANT
TAPE CLOTH 4X10 WHT NS (GAUZE/BANDAGES/DRESSINGS) ×2 IMPLANT
TOWEL GREEN STERILE (TOWEL DISPOSABLE) ×4 IMPLANT
TRAP SPECIMEN MUCUS 40CC (MISCELLANEOUS) ×4 IMPLANT
TRAY FOLEY SLVR 16FR LF STAT (SET/KITS/TRAYS/PACK) ×2 IMPLANT
TROCAR XCEL 12X100 BLDLESS (ENDOMECHANICALS) IMPLANT
TROCAR XCEL BLADELESS 5X75MML (TROCAR) ×2 IMPLANT
TUBING EXTENTION W/L.L. (IV SETS) IMPLANT
TUBING LAP HI FLOW INSUFFLATIO (TUBING) IMPLANT
WATER STERILE IRR 1000ML POUR (IV SOLUTION) ×2 IMPLANT

## 2020-08-15 NOTE — Plan of Care (Signed)

## 2020-08-15 NOTE — Brief Op Note (Signed)
08/11/2020 - 08/15/2020  2:33 PM  PATIENT:  Timothy [name].  60 y.o. male  PRE-OPERATIVE DIAGNOSIS:  empyema  POST-OPERATIVE DIAGNOSIS:  empyema  PROCEDURE:  Procedure(s): VIDEO ASSISTED THORACOSCOPY (VATS)/DECORTICATION (Left)  SURGEON:  Surgeon(s) and Role:    * Lightfoot, Eliezer Lofts, MD - Primary  PHYSICIAN ASSISTANT: Buffie Herne PA-C  ANESTHESIA:   general  EBL: 50 ML  BLOOD ADMINISTERED:none  DRAINS: 1 28 F  Chest Tube(s) in the LEFT HEMITHORAX   LOCAL MEDICATIONS USED:  MARCAINE     SPECIMEN:  Source of Specimen:  PLEURAL PEEL/EFFUSION   DISPOSITION OF SPECIMEN:  MICRO/PATH  COUNTS:  YES  TOURNIQUET:  * No tourniquets in log *  DICTATION: .Dragon Dictation  PLAN OF CARE: Admit to inpatient   PATIENT DISPOSITION:  PACU - hemodynamically stable.   Delay start of Pharmacological VTE agent (>24hrs) due to surgical blood loss or risk of bleeding: yes  COMPLICATIONS: NO KNOWN

## 2020-08-15 NOTE — Progress Notes (Signed)
PROGRESS NOTE    Timothy [name].  HDQ:[id number] DOB: 1961/03/23 DOA: 08/11/2020 PCP: Rolm Gala, NP  Brief Narrative: 60 year old male with multiple medical problems-HTN, T2DM, chronic diastolic CHF, HX OF osteomyelitis status post amputation of left great toe 10/21, PTSD/antisocial personality disorder, GERD, HLD obesity transferred from [facility] with acute hypoxic respiratory failure due to left-sided empyema needing CT surgery consult at [facility].  Patient was being managed with IV Zosyn.  CT Sx consulted at [facility] for left VATS decodritication:  Subjective: Seen and examined this morning Patient not making much eye contact does not seem to be interested interaction, but was able to answer all questions. Coughin at times, has chest pain and shortness of breath Overnight afebrile. On 3 L nasal cannula. Has been using bathroom and gets DOE  Assessment & Plan:  Acute hypoxic respiratory failure 2/2 empyema Left-sided empyema Empyema  Sepsis POA secondary to empyema-resolved CT surgery following and planning for VATS procedure 2/28.  Remains on IV Zosyn, supplemental oxygen at 3 L, antitussives. Recent Labs  Lab 08/10/20 1842 08/10/20 2026 08/11/20 1934 08/12/20 0218 08/13/20 0226 08/15/20 0332  WBC 13.5*  --  16.9* 14.9* 9.9 11.6*  LATICACIDVEN 1.1 1.1  --   --   --   --   PROCALCITON 0.48  --   --   --   --   --    Essential hypertension/HLD:BP is controlled, continue clonidine, amlodipine.  Continue Crestor for hyperlipidemia.  Type 2 diabetes mellitus with uncontrolled hyperglycemia on presentation needing IV insulin infusion.  Patient transition to Lantus and sliding scale.  Hemoglobin A1c 7.7.  Blood sugar well controlled on Lantus 10 units and sliding scale insulin.  Hold home glipizide and Metformin monitor CBG. Recent Labs  Lab 08/14/20 0732 08/14/20 1212 08/14/20 1635 08/14/20 1919 08/15/20 0729  GLUCAP 139* 145* 166* 184* 185*   Hyponatremia, mildly  low at 132.  Monitor  Chronic diastolic CHF: Monitor intake output as below.  Blood pressure control.  On Entresto, Plavix at home and remains on hold. Net IO Since Admission: 402.07 mL [08/15/20 0750]  Chronic Anemia-iron deficiency anemia iron supplementation at discharge.  Monitor hemoglobin  Left-sided abdominal pain/perinephric STRANDING on CT abdomen RT> LT: no obvious pyelonephritis patient is being treated with IV Zosyn, repeat imaging in 3 to 4 weeks.  Symptomatic management  PTSD/antisocial personality disorder:Continue patient's Celexa. He feels okay.  GERD: ppi  Overweight with BMI 29.5: Outpatient PCP follow-up for weight loss  Nutrition: Diet Order            Diet NPO time specified  Diet effective midnight                 DVT prophylaxis: enoxaparin (LOVENOX) injection 40 mg Start: 08/12/20 1715 SCDs Start: 08/11/20 1917 Code Status:   Code Status: Full Code  Family Communication: plan of care discussed with patient at bedside. Patient reports he has been talking to his friend and I updated his number as a contact person.  Reports he has no family.  Status is: Inpatient Remains inpatient appropriate because:Ongoing diagnostic testing needed not appropriate for outpatient work up, IV treatments appropriate due to intensity of illness or inability to take PO and Inpatient level of care appropriate due to severity of illness  Dispo:  Patient From: Home  Planned Disposition: Home  Medically stable for discharge: No     Consultants:see note  Procedures:see note  Unresulted Labs (From admission, onward)  None     Culture/Microbiology    Component Value Date/Time   SDES BLOOD BLOOD LEFT FOREARM 08/10/2020 2026   SPECREQUEST  08/10/2020 2026    BOTTLES DRAWN AEROBIC AND ANAEROBIC Blood Culture results may not be optimal due to an excessive volume of blood received in culture bottles   CULT  08/10/2020 2026    NO GROWTH 5 DAYS Performed at [facility], [address] [place], [place] [address]    REPTSTATUS 08/15/2020 FINAL 08/10/2020 2026    Other culture-see note  Medications: Scheduled Meds: . amLODipine  5 mg Oral Daily  . citalopram  20 mg Oral Daily  . cloNIDine  0.1 mg Oral QHS  . enoxaparin (LOVENOX) injection  40 mg Subcutaneous Q24H  . gabapentin  300 mg Oral TID  . guaiFENesin  1,200 mg Oral BID  . insulin aspart  0-15 Units Subcutaneous TID WC  . insulin aspart  0-5 Units Subcutaneous QHS  . insulin glargine  10 Units Subcutaneous Daily  . magnesium oxide  400 mg Oral Daily  . pantoprazole  40 mg Oral Daily  . rosuvastatin  5 mg Oral Daily  . senna  1 tablet Oral Daily   Continuous Infusions: . lactated ringers Stopped (08/11/20 2302)  . piperacillin-tazobactam (ZOSYN)  IV 3.375 g (08/15/20 0541)    Antimicrobials: Anti-infectives (From admission, onward)   Start     Dose/Rate Route Frequency Ordered Stop   08/11/20 2100  piperacillin-tazobactam (ZOSYN) IVPB 3.375 g        3.375 g 12.5 mL/hr over 240 Minutes Intravenous Every 8 hours 08/11/20 1930       Objective: Vitals: Today's Vitals   08/14/20 2116 08/14/20 2301 08/15/20 0335 08/15/20 0728  BP:  121/75 123/79 118/78  Pulse:  79 82 80  Resp:  18 18 20   Temp:  98.4 F (36.9 C) 98 F (36.7 C) 98.4 F (36.9 C)  TempSrc:  Oral Oral   SpO2:  96% 96% 96%  Weight:      Height:      PainSc: Asleep       Intake/Output Summary (Last 24 hours) at 08/15/2020 0743 Last data filed at 08/15/2020 0541 Gross per 24 hour  Intake 120 ml  Output 200 ml  Net -80 ml   Filed Weights   08/11/20 2120  Weight: 93.3 kg   Weight change:   Intake/Output from previous day: 02/27 0701 - 02/28 0700 In: 120 [P.O.:120] Out: 200 [Urine:200] Intake/Output this shift: No intake/output data recorded. Filed Weights   08/11/20 2120  Weight: 93.3 kg    Examination: General exam: AAOx3 ,NAD, low mood, weak appearing. HEENT:Oral mucosa moist, Ear/Nose  WNL grossly,dentition normal. Respiratory system: bilaterally diminished,no wheezing or crackles,no use of accessory muscle, non tender. Cardiovascular system: S1 & S2 +, regular, No JVD. Gastrointestinal system: Abdomen soft, NT,ND, BS+. Nervous System:Alert, awake, moving extremities and grossly nonfocal Extremities: No edema, distal peripheral pulses palpable.  Skin: No rashes,no icterus. MSK: Normal muscle bulk,tone, power   Data Reviewed: I have personally reviewed following labs and imaging studies CBC: Recent Labs  Lab 08/10/20 1842 08/11/20 1934 08/12/20 0218 08/13/20 0226 08/15/20 0332  WBC 13.5* 16.9* 14.9* 9.9 11.6*  NEUTROABS 11.2*  --   --  7.3  --   HGB 11.9* 11.9* 10.9* 11.0* 10.9*  HCT 35.4* 35.0* 33.3* 33.5* 33.7*  MCV 95.4 95.4 96.2 97.4 97.1  PLT 427* 509* 474* 453* 476*   Basic Metabolic Panel: Recent Labs  Lab 08/12/20 1321 08/12/20 1757 08/12/20 2223 08/13/20 0226 08/15/20 0332  NA 135 134* 134* 133* 132*  K 4.0 3.9 4.1 4.0 4.3  CL 96* 96* 96* 96* 94*  CO2 28 28 27 28 29   GLUCOSE 165* 151* 136* 154* 216*  BUN 15 14 13 12 9   CREATININE 0.91 0.78 1.04 0.92 0.84  CALCIUM 8.3* 8.3* 8.4* 8.2* 8.4*   GFR: Estimated Creatinine Clearance: 110 mL/min (by C-G formula based on SCr of 0.84 mg/dL). Liver Function Tests: Recent Labs  Lab 08/10/20 1842 08/13/20 0226  AST 17 17  ALT 18 17  ALKPHOS 139* 102  BILITOT 1.4* 0.7  PROT 7.4 5.8*  ALBUMIN 2.6* 1.8*   No results for input(s): LIPASE, AMYLASE in the last 168 hours. No results for input(s): AMMONIA in the last 168 hours. Coagulation Profile: Recent Labs  Lab 08/14/20 1033  INR 1.2   Cardiac Enzymes: No results for input(s): CKTOTAL, CKMB, CKMBINDEX, TROPONINI in the last 168 hours. BNP (last 3 results) No results for input(s): PROBNP in the last 8760 hours. HbA1C: No results for input(s): HGBA1C in the last 72 hours. CBG: Recent Labs  Lab 08/14/20 0732 08/14/20 1212 08/14/20 1635  08/14/20 1919 08/15/20 0729  GLUCAP 139* 145* 166* 184* 185*   Lipid Profile: No results for input(s): CHOL, HDL, LDLCALC, TRIG, CHOLHDL, LDLDIRECT in the last 72 hours. Thyroid Function Tests: No results for input(s): TSH, T4TOTAL, FREET4, T3FREE, THYROIDAB in the last 72 hours. Anemia Panel: No results for input(s): VITAMINB12, FOLATE, FERRITIN, TIBC, IRON, RETICCTPCT in the last 72 hours. Sepsis Labs: Recent Labs  Lab 08/10/20 1842 08/10/20 2026  PROCALCITON 0.48  --   LATICACIDVEN 1.1 1.1    Recent Results (from the past 240 hour(s))  Blood culture (single)     Status: None   Collection Time: 08/10/20  6:42 PM   Specimen: BLOOD  Result Value Ref Range Status   Specimen Description BLOOD RIGHT ANTECUBITAL  Final   Special Requests   Final    BOTTLES DRAWN AEROBIC AND ANAEROBIC Blood Culture adequate volume   Culture   Final    NO GROWTH 5 DAYS Performed at [facility], [address] [place], [address] [place]    Report Status 08/15/2020 FINAL  Final  Blood culture (single)     Status: None   Collection Time: 08/10/20  8:26 PM   Specimen: BLOOD  Result Value Ref Range Status   Specimen Description BLOOD BLOOD LEFT FOREARM  Final   Special Requests   Final    BOTTLES DRAWN AEROBIC AND ANAEROBIC Blood Culture results may not be optimal due to an excessive volume of blood received in culture bottles   Culture   Final    NO GROWTH 5 DAYS Performed at [facility], [address] [place], [address] [place]    Report Status 08/15/2020 FINAL  Final  Resp Panel by RT-PCR (Flu A&B, Covid) Nasopharyngeal Swab     Status: None   Collection Time: 08/10/20  8:26 PM   Specimen: Nasopharyngeal Swab; Nasopharyngeal(NP) swabs in vial transport medium  Result Value Ref Range Status   SARS Coronavirus 2 by RT PCR NEGATIVE NEGATIVE Final    Comment: (NOTE) SARS-CoV-2 target nucleic acids are NOT DETECTED.  The SARS-CoV-2 RNA is generally detectable in upper  respiratory specimens during the acute phase of infection. The lowest concentration of SARS-CoV-2 viral copies this assay can detect is 138 copies/mL. A negative result does not preclude SARS-Cov-2 infection  and should not be used as the sole basis for treatment or other patient management decisions. A negative result may occur with  improper specimen collection/handling, submission of specimen other than nasopharyngeal swab, presence of viral mutation(s) within the areas targeted by this assay, and inadequate number of viral copies(<138 copies/mL). A negative result must be combined with clinical observations, patient history, and epidemiological information. The expected result is Negative.  Fact Sheet for Patients:  BloggerCourse.com  Fact Sheet for Healthcare Providers:  SeriousBroker.it  This test is no t yet approved or cleared by the [place] FDA and  has been authorized for detection and/or diagnosis of SARS-CoV-2 by FDA under an Emergency Use Authorization (EUA). This EUA will remain  in effect (meaning this test can be used) for the duration of the COVID-19 declaration under Section 564(b)(1) of the Act, 21 U.S.C.section 360bbb-3(b)(1), unless the authorization is terminated  or revoked sooner.       Influenza A by PCR NEGATIVE NEGATIVE Final   Influenza B by PCR NEGATIVE NEGATIVE Final    Comment: (NOTE) The Xpert Xpress SARS-CoV-2/FLU/RSV plus assay is intended as an aid in the diagnosis of influenza from Nasopharyngeal swab specimens and should not be used as a sole basis for treatment. Nasal washings and aspirates are unacceptable for Xpert Xpress SARS-CoV-2/FLU/RSV testing.  Fact Sheet for Patients: BloggerCourse.com  Fact Sheet for Healthcare Providers: SeriousBroker.it  This test is not yet approved or cleared by the [place] FDA and has been  authorized for detection and/or diagnosis of SARS-CoV-2 by FDA under an Emergency Use Authorization (EUA). This EUA will remain in effect (meaning this test can be used) for the duration of the COVID-19 declaration under Section 564(b)(1) of the Act, 21 U.S.C. section 360bbb-3(b)(1), unless the authorization is terminated or revoked.  Performed at [facility], [address] [place], [place] [address]   Surgical PCR screen     Status: None   Collection Time: 08/14/20 11:50 AM   Specimen: Nasal Mucosa; Nasal Swab  Result Value Ref Range Status   MRSA, PCR NEGATIVE NEGATIVE Final   Staphylococcus aureus NEGATIVE NEGATIVE Final    Comment: (NOTE) The Xpert SA Assay (FDA approved for NASAL specimens in patients 70 years of age and older), is one component of a comprehensive surveillance program. It is not intended to diagnose infection nor to guide or monitor treatment. Performed at [facility], [address] [place], [place] [address]     Radiology Studies: No results found.   LOS: 4 days   Lanae Boast, MD [facility]  08/15/2020, 7:43 AM

## 2020-08-15 NOTE — Progress Notes (Signed)
     [address]       [place] [address]             [phone]       No events Continues to have pleuritic pain  Today's Vitals   08/14/20 2301 08/15/20 0335 08/15/20 0728 08/15/20 0940  BP: 121/75 123/79 118/78   Pulse: 79 82 80   Resp: 18 18 20    Temp: 98.4 F (36.9 C) 98 F (36.7 C) 98.4 F (36.9 C)   TempSrc: Oral Oral    SpO2: 96% 96% 96%   Weight:      Height:      PainSc:    6    Body mass index is 29.51 kg/m. Alert NAD EWOB Regular  OR today for L VATS decortication  Harrell O Lightfoot

## 2020-08-15 NOTE — Transfer of Care (Signed)
Immediate Anesthesia Transfer of Care Note  Patient: Timothy [name].  Procedure(s) Performed: VIDEO ASSISTED THORACOSCOPY (VATS)/DECORTICATION (Left )  Patient Location: PACU  Anesthesia Type:General  Level of Consciousness: drowsy and patient cooperative  Airway & Oxygen Therapy: Patient Spontanous Breathing and Patient connected to face mask oxygen  Post-op Assessment: Report given to RN and Post -op Vital signs reviewed and stable  Post vital signs: Reviewed and stable  Last Vitals:  Vitals Value Taken Time  BP 116/76 08/15/20 1530  Temp    Pulse 91 08/15/20 1535  Resp 20 08/15/20 1535  SpO2 89 % 08/15/20 1535  Vitals shown include unvalidated device data.  Last Pain:  Vitals:   08/15/20 0940  TempSrc:   PainSc: 6       Patients Stated Pain Goal: 0 (08/15/20 0940)  Complications: No complications documented.

## 2020-08-15 NOTE — Anesthesia Preprocedure Evaluation (Addendum)
Anesthesia Evaluation  Patient identified by MRN, date of birth, ID band Patient awake    Reviewed: Allergy & Precautions, NPO status , Patient's Chart, lab work & pertinent test results  History of Anesthesia Complications Negative for: history of anesthetic complications  Airway Mallampati: III  TM Distance: >3 FB Neck ROM: Full    Dental  (+) Dental Advisory Given   Pulmonary shortness of breath, Current Smoker and Patient abstained from smoking.,  Covid-19 Nucleic Acid Test Results Lab Results      Component                Value               Date                      SARSCOV2NAA              NEGATIVE            08/10/2020                SARSCOV2NAA              NEGATIVE            06/15/2020                SARSCOV2NAA              NEGATIVE            03/15/2020                SARSCOV2NAA              NEGATIVE            12/18/2019                SARSCOV2                 Negative            08/10/2020            Left empyema     + decreased breath sounds      Cardiovascular hypertension, Pt. on medications +CHF   Rhythm:Regular  Left Ventricle: Left ventricular ejection fraction, by estimation, is 60  to 65%. The left ventricle has normal function. The left ventricle has no  regional wall motion abnormalities. Definity contrast agent was given IV  to delineate the left ventricular  endocardial borders. The left ventricular internal cavity size was normal  in size. There is no left ventricular hypertrophy. Left ventricular  diastolic parameters were normal.   Right Ventricle: The right ventricular size is normal. No increase in  right ventricular wall thickness. Right ventricular systolic function is  normal. Tricuspid regurgitation signal is inadequate for assessing PA  pressure.   Left Atrium: Left atrial size was mildly dilated.   Right Atrium: Right atrial size was normal in size.   Pericardium: There is no  evidence of pericardial effusion.   Mitral Valve: The mitral valve is normal in structure. Mild mitral valve  regurgitation. No evidence of mitral valve stenosis.   Tricuspid Valve: The tricuspid valve is normal in structure. Tricuspid  valve regurgitation is not demonstrated. No evidence of tricuspid  stenosis.   Aortic Valve: The aortic valve is normal in structure. Aortic valve  regurgitation is not visualized. Mild aortic valve sclerosis is present,  with no evidence of aortic valve stenosis. Aortic valve mean gradient  measures 5.0 mmHg. Aortic valve  peak  gradient measures 10.5 mmHg. Aortic valve area, by VTI measures 4.08 cm.   Pulmonic Valve: The pulmonic valve was normal in structure. Pulmonic valve  regurgitation is not visualized. No evidence of pulmonic stenosis.   Aorta: The aortic root is normal in size and structure.   Venous: The inferior vena cava is normal in size with greater than 50%  respiratory variability, suggesting right atrial pressure of 3 mmHg.   IAS/Shunts: No atrial level shunt detected by color flow Doppler.    Neuro/Psych neg Seizures PSYCHIATRIC DISORDERS Anxiety  Neuromuscular disease    GI/Hepatic negative GI ROS, Neg liver ROS,   Endo/Other  diabetes  Renal/GU negative Renal ROSLab Results      Component                Value               Date                      CREATININE               0.84                08/15/2020                Musculoskeletal   Abdominal   Peds  Hematology  (+) Blood dyscrasia, anemia , Lab Results      Component                Value               Date                      WBC                      11.6 (H)            08/15/2020                HGB                      10.9 (L)            08/15/2020                HCT                      33.7 (L)            08/15/2020                MCV                      97.1                08/15/2020                PLT                      476 (H)             08/15/2020               Anesthesia Other Findings   Reproductive/Obstetrics                            Anesthesia Physical Anesthesia Plan  ASA: III  Anesthesia Plan:  General   Post-op Pain Management:    Induction: Intravenous  PONV Risk Score and Plan: 1 and Ondansetron and Amisulpride  Airway Management Planned: Double Lumen EBT  Additional Equipment: Arterial line and None  Intra-op Plan:   Post-operative Plan: Extubation in OR and Possible Post-op intubation/ventilation  Informed Consent: I have reviewed the patients History and Physical, chart, labs and discussed the procedure including the risks, benefits and alternatives for the proposed anesthesia with the patient or authorized representative who has indicated his/her understanding and acceptance.     Dental advisory given  Plan Discussed with: CRNA and Surgeon  Anesthesia Plan Comments:         Anesthesia Quick Evaluation

## 2020-08-15 NOTE — Anesthesia Procedure Notes (Signed)
Arterial Line Insertion Start/End2/28/2022 11:20 AM, 08/15/2020 11:35 AM Performed by: Rosiland Oz, CRNA, CRNA  Patient location: Pre-op. Preanesthetic checklist: patient identified, IV checked, site marked, risks and benefits discussed, surgical consent, monitors and equipment checked, pre-op evaluation, timeout performed and anesthesia consent Lidocaine 1% used for infiltration Left, radial was placed Catheter size: 20 G Hand hygiene performed , maximum sterile barriers used  and Seldinger technique used  Attempts: 1 Procedure performed without using ultrasound guided technique. Following insertion, dressing applied and Biopatch. Post procedure assessment: normal and unchanged  Patient tolerated the procedure well with no immediate complications.

## 2020-08-15 NOTE — Anesthesia Procedure Notes (Signed)
Procedure Name: Intubation Date/Time: 08/15/2020 1:02 PM Performed by: Lance Coon, CRNA Pre-anesthesia Checklist: Patient identified, Emergency Drugs available, Suction available, Patient being monitored and Timeout performed Patient Re-evaluated:Patient Re-evaluated prior to induction Oxygen Delivery Method: Circle system utilized Preoxygenation: Pre-oxygenation with 100% oxygen Induction Type: IV induction Ventilation: Mask ventilation without difficulty Laryngoscope Size: Mac and 4 Grade View: Grade II Tube type: Oral Endobronchial tube: Left, Double lumen EBT, EBT position confirmed by fiberoptic bronchoscope and EBT position confirmed by auscultation and 39 Fr Number of attempts: 1 Airway Equipment and Method: Stylet and Fiberoptic brochoscope Placement Confirmation: ETT inserted through vocal cords under direct vision,  positive ETCO2 and breath sounds checked- equal and bilateral Tube secured with: Tape Dental Injury: Teeth and Oropharynx as per pre-operative assessment

## 2020-08-15 NOTE — Op Note (Signed)
     [address]       Timothy [name] [address]             [phone]       08/15/2020 Patient:  Timothy [name]. Pre-Op Dx: Loculated left pleural effusion   Parapneumonic effusion Post-op Dx: Same Procedure: -Left video assisted thoracoscopy - Decortication - Intercostal nerve block  Surgeon and Role:      * Nikayla Madaris, Eliezer Lofts, MD - Primary    *Gershon Crane, PA-C- assisting   Anesthesia  general EBL: 150.  ml Blood Administration: None Specimen: Pleural rind, pleural fluid  Drains: 28 F argyle chest tube in left chest Counts: correct    Indications: This is a 60 year old gentleman that presents with a large loculated left-sided pleural effusion. I will review of his images he does have a dominant collection but given the loculations he likely will benefit most from a VATS procedure. Findings: Bloody fever no purulent loculated effusion.  There was a large effusion apically and posteriorly.  Most of the proteinaceous exudate was found at the base.  The lower lobe was densely adherent to the diaphragm and to the heart.  We were able to achieve full mobilization and full expansion.  Operative Technique: After the risks, benefits and alternatives were thoroughly discussed, the patient was brought to the operative theatre.  Anesthesia was induced, the patient was then placed in a right lateral decubitus position and was prepped and draped in normal sterile fashion.  An appropriate surgical pause was performed, and pre-operative antibiotics were dosed accordingly.  We began with 2cm incision in the anterior axillary line at the 5th intercostal space.  The chest was entered, and we then placed a 1cm incision at the 10th intercostal space, and introduced our camera port.  The lung was directly visualized.  There was a bloody fibrinopurulent empyema.   The lung was then mobilized off of the chest wall.   The pleural peal was carefully decorticated off of each lobe.   The fissure was then mobilized.  We achieved good expansion of the lung.  The chest was then irrigated.    An intercostal nerve block was performed under direct visualization.  A 28 F chest tube was then placed, and we watch the lung re-expand.  The skin and soft tissue were closed with absorbable suture    The patient tolerated the procedure without any immediate complications, and was transferred to the PACU in stable condition.  Johnella Crumm Keane Scrape

## 2020-08-16 ENCOUNTER — Inpatient Hospital Stay (HOSPITAL_COMMUNITY): Payer: Medicaid Other

## 2020-08-16 ENCOUNTER — Encounter (HOSPITAL_COMMUNITY): Payer: Self-pay | Admitting: Thoracic Surgery (Cardiothoracic Vascular Surgery)

## 2020-08-16 DIAGNOSIS — M79609 Pain in unspecified limb: Secondary | ICD-10-CM

## 2020-08-16 DIAGNOSIS — M79605 Pain in left leg: Secondary | ICD-10-CM

## 2020-08-16 DIAGNOSIS — I739 Peripheral vascular disease, unspecified: Secondary | ICD-10-CM

## 2020-08-16 LAB — COMPREHENSIVE METABOLIC PANEL
ALT: 9 U/L (ref 0–44)
AST: 15 U/L (ref 15–41)
Albumin: 1.8 g/dL — ABNORMAL LOW (ref 3.5–5.0)
Alkaline Phosphatase: 90 U/L (ref 38–126)
Anion gap: 9 (ref 5–15)
BUN: 16 mg/dL (ref 6–20)
CO2: 28 mmol/L (ref 22–32)
Calcium: 8 mg/dL — ABNORMAL LOW (ref 8.9–10.3)
Chloride: 93 mmol/L — ABNORMAL LOW (ref 98–111)
Creatinine, Ser: 1.23 mg/dL (ref 0.61–1.24)
GFR, Estimated: >60 mL/min (ref >60)
Glucose, Bld: 226 mg/dL — ABNORMAL HIGH (ref 70–99)
Potassium: 4.5 mmol/L (ref 3.5–5.1)
Sodium: 130 mmol/L — ABNORMAL LOW (ref 135–145)
Total Bilirubin: 0.7 mg/dL (ref 0.3–1.2)
Total Protein: 5.9 g/dL — ABNORMAL LOW (ref 6.5–8.1)

## 2020-08-16 LAB — HEPARIN LEVEL (UNFRACTIONATED): Heparin Unfractionated: <0.1 IU/mL — ABNORMAL LOW (ref 0.30–0.70)

## 2020-08-16 LAB — POCT I-STAT 7, (LYTES, BLD GAS, ICA,H+H)
Acid-Base Excess: 4 mmol/L — ABNORMAL HIGH (ref 0.0–2.0)
Bicarbonate: 29.5 mmol/L — ABNORMAL HIGH (ref 20.0–28.0)
Calcium, Ion: 1.08 mmol/L — ABNORMAL LOW (ref 1.15–1.40)
HCT: 30 % — ABNORMAL LOW (ref 39.0–52.0)
Hemoglobin: 10.2 g/dL — ABNORMAL LOW (ref 13.0–17.0)
O2 Saturation: 99 %
Potassium: 4.2 mmol/L (ref 3.5–5.1)
Sodium: 132 mmol/L — ABNORMAL LOW (ref 135–145)
TCO2: 31 mmol/L (ref 22–32)
pCO2 arterial: 47.3 mmHg (ref 32.0–48.0)
pH, Arterial: 7.403 (ref 7.350–7.450)
pO2, Arterial: 122 mmHg — ABNORMAL HIGH (ref 83.0–108.0)

## 2020-08-16 LAB — CBC
HCT: 33.2 % — ABNORMAL LOW (ref 39.0–52.0)
Hemoglobin: 10.3 g/dL — ABNORMAL LOW (ref 13.0–17.0)
MCH: 31 pg (ref 26.0–34.0)
MCHC: 31 g/dL (ref 30.0–36.0)
MCV: 100 fL (ref 80.0–100.0)
Platelets: 491 10*3/uL — ABNORMAL HIGH (ref 150–400)
RBC: 3.32 MIL/uL — ABNORMAL LOW (ref 4.22–5.81)
RDW: 13.4 % (ref 11.5–15.5)
WBC: 14.1 10*3/uL — ABNORMAL HIGH (ref 4.0–10.5)
nRBC: 0 % (ref 0.0–0.2)

## 2020-08-16 LAB — GLUCOSE, CAPILLARY
Glucose-Capillary: 195 mg/dL — ABNORMAL HIGH (ref 70–99)
Glucose-Capillary: 191 mg/dL — ABNORMAL HIGH (ref 70–99)
Glucose-Capillary: 205 mg/dL — ABNORMAL HIGH (ref 70–99)
Glucose-Capillary: 276 mg/dL — ABNORMAL HIGH (ref 70–99)
Glucose-Capillary: 210 mg/dL — ABNORMAL HIGH (ref 70–99)

## 2020-08-16 LAB — SURGICAL PATHOLOGY

## 2020-08-16 MED ORDER — SODIUM CHLORIDE 0.9 % IV SOLN: INTRAVENOUS | Status: AC

## 2020-08-16 MED ORDER — ALBUTEROL SULFATE (2.5 MG/3ML) 0.083% IN NEBU: 2.5000 mg | INHALATION_SOLUTION | Freq: Two times a day (BID) | RESPIRATORY_TRACT | Status: DC | PRN

## 2020-08-16 MED ORDER — HEPARIN (PORCINE) 25000 UT/250ML-% IV SOLN
2050.0000 [IU]/h | INTRAVENOUS | Status: DC
  Administered 2020-08-16: 1500 [IU]/h via INTRAVENOUS
  Administered 2020-08-17: 1800 [IU]/h via INTRAVENOUS
  Filled 2020-08-16 (×3): qty 250

## 2020-08-16 NOTE — Progress Notes (Signed)
LLEV and ABI completed.  Message sent to Provider Kc  Results provided to RN     Please see CV Proc for preliminary results.   Clint Guy, RVT

## 2020-08-16 NOTE — Progress Notes (Signed)
ANTICOAGULATION CONSULT NOTE - Follow Up Consult  Pharmacy Consult for IV Heparin Indication: DVT  Allergies  Allergen Reactions  . Bee Venom Anaphylaxis  . Coffea Arabica Shortness Of Breath  . Coffee Flavor Shortness Of Breath  . Cheese Swelling    Blue Cheese only    Patient Measurements: Height: 5\' 10"  (177.8 cm) Weight: 93.3 kg (205 lb 11 oz) IBW/kg (Calculated) : 73 Heparin Dosing Weight: 93 kg  Vital Signs: Temp: 98 F (36.7 C) (03/01 1600) Temp Source: Oral (03/01 1600) BP: 112/69 (03/01 1600) Pulse Rate: 75 (03/01 1600)  Labs: Recent Labs    08/14/20 1033 08/15/20 0332 08/15/20 0332 08/15/20 1446 08/16/20 0054 08/16/20 1728  HGB  --  10.9*   < > 10.2* 10.3*  --   HCT  --  33.7*  --  30.0* 33.2*  --   PLT  --  476*  --   --  491*  --   LABPROT 14.7  --   --   --   --   --   INR 1.2  --   --   --   --   --   HEPARINUNFRC  --   --   --   --   --  <0.10*  CREATININE  --  0.84  --   --  1.23  --    < > = values in this interval not displayed.    Estimated Creatinine Clearance: 75.1 mL/min (by C-G formula based on SCr of 1.23 mg/dL).   Medical History: Past Medical History:  Diagnosis Date  . CHF (congestive heart failure) (HCC)   . Diabetes mellitus without complication (HCC)   . Hypercholesteremia   . Hypertension    Assessment: 60 yr old man admitted with empyema, S/P VATs 2/28, now found with acute L DVT. Pharmacy was consulted to dose IV heparin with no bolus given; pt was on no anticoagulant PTA. Chest tubes remain in place. H/H 10.3/33.2, plt 291 (CBC stable).  Heparin level ~6.5 hrs after starting heparin infusion at 1500 units/hr (no bolus) was <0.10 units/ml (undetectable). Per RN, no issues with IV or bleeding observed.  Goal of Therapy:  Heparin level 0.3-0.7 units/ml Monitor platelets by anticoagulation protocol: Yes   Plan:  Increase heparin infusion to 1800 units/hr Check heparin level in 6 hrs Monitor daily heparin level,  CBC Monitor for bleeding  05-13-1999, PharmD, BCPS, [facility] Clinical Pharmacist 08/16/2020

## 2020-08-16 NOTE — Anesthesia Postprocedure Evaluation (Signed)
Anesthesia Post Note  Patient: Herson Prichard.  Procedure(s) Performed: VIDEO ASSISTED THORACOSCOPY (VATS)/DECORTICATION (Left )     Patient location during evaluation: PACU Anesthesia Type: General Level of consciousness: awake and alert Pain management: pain level controlled Vital Signs Assessment: post-procedure vital signs reviewed and stable Respiratory status: spontaneous breathing, respiratory function stable and patient connected to nasal cannula oxygen Cardiovascular status: blood pressure returned to baseline and stable Postop Assessment: no apparent nausea or vomiting Anesthetic complications: no   No complications documented.  Last Vitals:  Vitals:   08/16/20 1600 08/16/20 1921  BP: 112/69 93/62  Pulse: 75 75  Resp: 17 17  Temp: 36.7 C 36.8 C  SpO2: 96% 97%    Last Pain:  Vitals:   08/16/20 2055  TempSrc:   PainSc: Asleep                 Kanyon Seibold

## 2020-08-16 NOTE — Progress Notes (Signed)
ANTICOAGULATION CONSULT NOTE - Initial Consult  Pharmacy Consult for heparin Indication: DVT  Allergies  Allergen Reactions  . Bee Venom Anaphylaxis  . Coffea Arabica Shortness Of Breath  . Coffee Flavor Shortness Of Breath  . Cheese Swelling    Blue Cheese only    Patient Measurements: Height: 5\' 10"  (177.8 cm) Weight: 93.3 kg (205 lb 11 oz) IBW/kg (Calculated) : 73 Heparin Dosing Weight: 93kg  Vital Signs: Temp: 98 F (36.7 C) (03/01 0800) Temp Source: Oral (03/01 0800) BP: 93/63 (03/01 0900) Pulse Rate: 72 (03/01 0900)  Labs: Recent Labs    08/14/20 1033 08/15/20 0332 08/15/20 0332 08/15/20 1446 08/16/20 0054  HGB  --  10.9*   < > 10.2* 10.3*  HCT  --  33.7*  --  30.0* 33.2*  PLT  --  476*  --   --  491*  LABPROT 14.7  --   --   --   --   INR 1.2  --   --   --   --   CREATININE  --  0.84  --   --  1.23   < > = values in this interval not displayed.    Estimated Creatinine Clearance: 75.1 mL/min (by C-G formula based on SCr of 1.23 mg/dL).   Medical History: Past Medical History:  Diagnosis Date  . CHF (congestive heart failure) (HCC)   . Diabetes mellitus without complication (HCC)   . Hypercholesteremia   . Hypertension      Assessment: 24 yoM admitted with empyema s/p VATs 2/28 now found with acute L DVT. Pharmacy asked to start IV heparin with no bolus given chest tubes remain in place. CBC stable this morning, no AC PTA noted.  Goal of Therapy:  Heparin level 0.3-0.7 units/ml Monitor platelets by anticoagulation protocol: Yes   Plan:  Heparin 1500 units/h no bolus Check 6h heparin level   3/28, PharmD, [place], [facility] Clinical Pharmacist [phone] Please check AMION for all [facility] Pharmacy numbers 08/16/2020

## 2020-08-16 NOTE — Progress Notes (Addendum)
      [address]       Timothy [name] [address]             [phone]      1 Day Post-Op Procedure(s) (LRB): VIDEO ASSISTED THORACOSCOPY (VATS)/DECORTICATION (Left) Subjective: Pain is better after his medication however, still having pain with movement. Appetite has not picked up yet.  Objective: Vital signs in last 24 hours: Temp:  [97.6 F (36.4 C)-98.4 F (36.9 C)] 97.6 F (36.4 C) (03/01 0300) Pulse Rate:  [65-96] 76 (03/01 0529) Cardiac Rhythm: Normal sinus rhythm (02/28 1931) Resp:  [11-20] 18 (03/01 0529) BP: (101-119)/(68-79) 104/70 (03/01 0300) SpO2:  [85 %-98 %] 95 % (03/01 0529) Arterial Line BP: (127-139)/(53-64) 134/64 (02/28 1630)     Intake/Output from previous day: 02/28 0701 - 03/01 0700 In: 1200 [I.V.:1100] Out: 1010 [Urine:600; Blood:100; Chest Tube:310] Intake/Output this shift: No intake/output data recorded.  General appearance: alert, cooperative and no distress Heart: regular rate and rhythm, S1, S2 normal, no murmur, click, rub or gallop Lungs: clear to auscultation bilaterally Abdomen: soft, non-tender; bowel sounds normal; no masses,  no organomegaly Extremities: extremities normal, atraumatic, no cyanosis or edema Wound: clean and dry  Lab Results: Recent Labs    08/15/20 0332 08/16/20 0054  WBC 11.6* 14.1*  HGB 10.9* 10.3*  HCT 33.7* 33.2*  PLT 476* 491*   BMET:  Recent Labs    08/15/20 0332 08/16/20 0054  NA 132* 130*  K 4.3 4.5  CL 94* 93*  CO2 29 28  GLUCOSE 216* 226*  BUN 9 16  CREATININE 0.84 1.23  CALCIUM 8.4* 8.0*    PT/INR:  Recent Labs    08/14/20 1033  LABPROT 14.7  INR 1.2   ABG    Component Value Date/Time   PHART 7.351 08/15/2020 1545   HCO3 27.8 08/15/2020 1545   O2SAT 88.2 08/15/2020 1545   CBG (last 3)  Recent Labs    08/15/20 1618 08/15/20 2106 08/16/20 0615  GLUCAP 164* 179* 195*    Assessment/Plan: S/P Procedure(s) (LRB): VIDEO ASSISTED THORACOSCOPY  (VATS)/DECORTICATION (Left)  1. S/p left VATs for empyema. Continue on Zosyn.  2. NSR in the 60, BP well controlled. Continue norvasc and statin 3. Tolerating room air with good oxygen saturation. Stable chest xray. Keep chest tube in place, 310cc/24 hours.  4. Renal function stable. Electrolytes okay. 5. H and H 10.9/33.7, expected acute blood loss anemia, stable 6. Endo-blood glucose well controlled for the most part. 7. DVT prophylaxis-no lovenox ordered, SCDs in place  Plan: Continue mucinex for wet cough, OOB to chair and ambulate in the halls. Chest tube can likely be placed to water seal. No air leak on exam this morning. Will hold off on lovenox until Dr. Cliffton Asters is comfortable adding.    LOS: 5 days    Timothy [name] 08/16/2020  Agree with above.  We will keep chest tube to suction for at least 48 hours. Continue pulmonary toilet Rest per primary  Swayzie Choate O Julicia Krieger

## 2020-08-16 NOTE — Hospital Course (Addendum)
  60 year old previously home dwelling male HTN DM TY 2 with hyperglycemia HFpEF Peripheral vascular disease + DES lower extremity-osteomyelitis with amputation left great toe + group B strep 03/22/2020-Rx Augmentin PTSD plus antisocial disorder  Admit to [facility] left-sided chest pain, spasm + S OB with sleep and exertion + coughing spells blood-tinged sputum + Chills no fever fatigue  CT = large left pleural effusion with multiple loculations-WBC 13 sodium 126 procalcitonin 0.4  Transfer to [facility] 2/28 had VATS with left-sided decortication    Acute hypoxic respiratory failure 2/2 empyema Sepsis prior to arrival  PVD + left great toe amputation + stent  Left soleal DVT  HTN  DM TY 2  Hyponatremia  Left-sided abdominal pain  PTSD antisocial personality disorder insomnia  IMPRESSION 3/6 1. The left-sided empyema is much smaller in the interval with less pleural fluid. However, there is still a rind of pleural fluid, most of which is not free-flowing and apparently loculated. The chest tube on the left, placed in the interval, courses through this fluid. There is associated atelectasis. 2. Air in the left pleural space is likely due to the chest tube. 3. Patchy opacities in the right upper lobe, likely infectious. Recommend attention on follow-up. 4. There is a 2.3 cm nodular region in the medial left upper lobe abutting the mediastinum as seen on coronal image 52. This could represent focal pleural thickening or atelectasis given recent pleural fluid in this region. A neoplasm cannot be excluded on this study alone. Recommend short-term follow-up imaging to ensure resolution. 5. Calcified atherosclerosis in the nonaneurysmal aorta. 6. Coronary artery calcifications. 7. New small right pleural effusion

## 2020-08-16 NOTE — Progress Notes (Signed)
PROGRESS NOTE    Timothy [name].  VFI:[id number] DOB: 12/20/1960 DOA: 08/11/2020 PCP: Rolm Gala, NP  Brief Narrative: 60 year old male with multiple medical problems-HTN, T2DM, chronic diastolic CHF, HX OF osteomyelitis status post amputation of left great toe 10/21, PTSD/antisocial personality disorder, GERD, HLD obesity transferred from [facility] with acute hypoxic respiratory failure due to left-sided empyema needing CT surgery consult at [facility].  Patient was being managed with IV Zosyn.  CT Sx was consulted at [facility]. 2.28- s/p  VATS/decoritication on left:  Subjective: Seen and examined this morning. Afebrile overnight WBC trended up Left leg numbness and pain esp,on walking prior to admission Chest tube in place, has cough, chest soreness but overall stable.  Assessment & Plan:  Acute hypoxic respiratory failure 2/2 empyema Left-sided empyema   Sepsis POA secondary to empyema-sepsis resolved S/p VATS w/ decortication on the left by Dr. Cliffton Asters.patient is afebrile but WBC trending up.  Continue IV Zosyn, supplemental [place] to maintain spo2 >94,pulmonary toiletting.  Chest tube  Placed to waterseal this morning by CT surgery. Recent Labs  Lab 08/10/20 1842 08/10/20 2026 08/11/20 1934 08/12/20 0218 08/13/20 0226 08/15/20 0332 08/16/20 0054  WBC 13.5*  --  16.9* 14.9* 9.9 11.6* 14.1*  LATICACIDVEN 1.1 1.1  --   --   --   --   --   PROCALCITON 0.48  --   --   --   --   --   --    PVD hx of left to amputation/ hx of stent-c/o left leg numbness/pain on walking prior to admission- checked vas art/abi-appears normal.  But found to have left soleal DVT-discussed with Dr. Cliffton Asters okay to start heparin without bolus.  Home plavix on hold.  Left soleal DVT: Starting heparin without bolus ( 2/2 chest tube), discussed with thoracic surgery.  Essential hypertension/HLD:BP soft. Hold amlodipine, on clonidine bedtime for anxiety ( can hold if hypotension-RN informed). On Crestor  for hyperlipidemia.  T2DM with uncontrolled hyperglycemia on presentation needing IV insulin infusion.  Patient transitioned to Lantus and sliding scale.  Hemoglobin A1c 7.7.  Sugars well controlled.  Continue Lantus 10 units and sliding scale. Cont to hold home glipizide and Metformin monitor CBG. Recent Labs  Lab 08/15/20 1101 08/15/20 1618 08/15/20 2106 08/16/20 0615 08/16/20 0757  GLUCAP 160* 164* 179* 195* 191*   Hyponatremia, mildly low at 132>130, monitor  Chronic diastolic CHF: Monitor intake output as below. On Entresto, Plavix at home and remains on hold. Net IO Since Admission: 712.07 mL [08/16/20 1034]  Chronic Anemia-iron deficiency anemia iron supplementation at discharge.  Monitor hemoglobin here Recent Labs  Lab 08/12/20 0218 08/13/20 0226 08/15/20 0332 08/15/20 1446 08/16/20 0054  HGB 10.9* 11.0* 10.9* 10.2* 10.3*  HCT 33.3* 33.5* 33.7* 30.0* 33.2*   Left-sided abdominal pain/perinephric STRANDING on CT abdomen RT> LT: no obvious pyelonephritis - patient is being treated with IV Zosyn, repeat imaging in 3 to 4 weeks.  Symptomatic management  PTSD/antisocial personality disorder: mood stable on Celexa.  GERD:Cont ppi  Overweight with BMI 29.5: Outpatient PCP follow-up for weight loss  Nutrition: Diet Order            Diet Heart Room service appropriate? Yes; Fluid consistency: Thin  Diet effective now                 DVT prophylaxis: SCD's Start: 08/15/20 1745 SCDs Start: 08/11/20 1917 Code Status:   Code Status: Full Code  Family Communication: plan of care discussed with patient  at bedside. Patient reports he has been talking to his friend and I updated his number as a contact person.  Reports he has no family.  Status is: Inpatient Remains inpatient appropriate because:Ongoing diagnostic testing needed not appropriate for outpatient work up, IV treatments appropriate due to intensity of illness or inability to take PO and Inpatient level of care  appropriate due to severity of illness  Dispo:  Patient From: Home  Planned Disposition: Home  Medically stable for discharge: No     Consultants:see note  Procedures:see note  Culture/Microbiology    Component Value Date/Time   SDES FLUID PLEURAL LEFT 08/15/2020 1348   SPECREQUEST NONE 08/15/2020 1348   CULT  08/15/2020 1348    NO GROWTH < 24 HOURS Performed at [facility], [address], [place], [place] [address]    REPTSTATUS PENDING 08/15/2020 1348    Other culture-see note  Medications: Scheduled Meds: . acetaminophen  1,000 mg Oral Q6H   Or  . acetaminophen (TYLENOL) oral liquid 160 mg/5 mL  1,000 mg Oral Q6H  . amLODipine  5 mg Oral Daily  . bisacodyl  10 mg Oral Daily  . chlorhexidine  15 mL Mouth/Throat Once  . citalopram  20 mg Oral Daily  . cloNIDine  0.1 mg Oral QHS  . gabapentin  300 mg Oral TID  . guaiFENesin  1,200 mg Oral BID  . insulin aspart  0-15 Units Subcutaneous TID WC  . insulin aspart  0-5 Units Subcutaneous QHS  . insulin glargine  10 Units Subcutaneous Daily  . ketorolac  15 mg Intravenous Q6H  . magnesium oxide  400 mg Oral Daily  . nicotine  21 mg Transdermal Daily  . pantoprazole  40 mg Oral Daily  . rosuvastatin  5 mg Oral Daily  . senna  1 tablet Oral Daily   Continuous Infusions: . sodium chloride    . lactated ringers Stopped (08/15/20 1524)  . piperacillin-tazobactam (ZOSYN)  IV 3.375 g (08/16/20 0538)    Antimicrobials: Anti-infectives (From admission, onward)   Start     Dose/Rate Route Frequency Ordered Stop   08/11/20 2100  piperacillin-tazobactam (ZOSYN) IVPB 3.375 g        3.375 g 12.5 mL/hr over 240 Minutes Intravenous Every 8 hours 08/11/20 1930       Objective: Vitals: Today's Vitals   08/16/20 0800 08/16/20 0802 08/16/20 0822 08/16/20 0900  BP: (!) 89/65 (!)  Pulse: 67 66 68 72  Resp: Temp: 98 F (36.7 C)     TempSrc: Oral     SpO2:  97% 97% 98%  Weight:       Height:      PainSc: 0-No pain       Intake/Output Summary (Last 24 hours) at 08/16/2020 1034 Last data filed at 08/16/2020 0754 Gross per 24 hour  Intake 1320 ml  Output 1010 ml  Net 310 ml   Filed Weights   08/11/20 2120  Weight: 93.3 kg   Weight change:   Intake/Output from previous day: 02/28 0701 - 03/01 0700 In: 1200 [I.V.:1100] Out: 1010 [Urine:600; Blood:100; Chest Tube:310] Intake/Output this shift: Total I/O In: 120 [P.O.:120] Out: -  Filed Weights   08/11/20 2120  Weight: 93.3 kg    Examination: General exam: AAOx3, not in acute distress, weak appearing. HEENT:Oral mucosa moist, Ear/Nose WNL grossly, dentition normal. Respiratory system: bilaterally clear,no wheezing or crackles,no use of accessory muscle.Multiple chest tube in place. Cardiovascular  system: S1 & S2 +,No JVD. Gastrointestinal system: Abdomen soft, NT,ND,BS+ Nervous System:Alert,awake,moving extremities and grossly nonfocal Extremities:left leg edema,no edema,distal peripheral pulses palpable. Left grt toe amputated ( old)  Skin: No rashes,no icterus. MSK: Normal muscle bulk,tone, power   Data Reviewed: I have personally reviewed following labs and imaging studies CBC: Recent Labs  Lab 08/10/20 1842 08/11/20 1934 08/12/20 0218 08/13/20 0226 08/15/20 0332 08/15/20 1446 08/16/20 0054  WBC 13.5* 16.9* 14.9* 9.9 11.6*  --  14.1*  NEUTROABS 11.2*  --   --  7.3  --   --   --   HGB 11.9* 11.9* 10.9* 11.0* 10.9* 10.2* 10.3*  HCT 35.4* 35.0* 33.3* 33.5* 33.7* 30.0* 33.2*  MCV 95.4 95.4 96.2 97.4 97.1  --  100.0  PLT 427* 509* 474* 453* 476*  --  491*   Basic Metabolic Panel: Recent Labs  Lab 08/12/20 1757 08/12/20 2223 08/13/20 0226 08/15/20 0332 08/15/20 1446 08/16/20 0054  NA 134* 134* 133* 132* 132* 130*  K 3.9 4.1 4.0 4.3 4.2 4.5  CL 96* 96* 96* 94*  --  93*  CO2 --  28  GLUCOSE 151* 136* 154* 216*  --  226*  BUN --  16  CREATININE 0.78 1.04 0.92  0.84  --  1.23  CALCIUM 8.3* 8.4* 8.2* 8.4*  --  8.0*   GFR: Estimated Creatinine Clearance: 75.1 mL/min (by C-G formula based on SCr of 1.23 mg/dL). Liver Function Tests: Recent Labs  Lab 08/10/20 1842 08/13/20 0226 08/16/20 0054  AST ALT ALKPHOS 139* 102 90  BILITOT 1.4* 0.7 0.7  PROT 7.4 5.8* 5.9*  ALBUMIN 2.6* 1.8* 1.8*   No results for input(s): LIPASE, AMYLASE in the last 168 hours. No results for input(s): AMMONIA in the last 168 hours. Coagulation Profile: Recent Labs  Lab 08/14/20 1033  INR 1.2   Cardiac Enzymes: No results for input(s): CKTOTAL, CKMB, CKMBINDEX, TROPONINI in the last 168 hours. BNP (last 3 results) No results for input(s): PROBNP in the last 8760 hours. HbA1C: No results for input(s): HGBA1C in the last 72 hours. CBG: Recent Labs  Lab 08/15/20 1101 08/15/20 1618 08/15/20 2106 08/16/20 0615 08/16/20 0757  GLUCAP 160* 164* 179* 195* 191*   Lipid Profile: No results for input(s): CHOL, HDL, LDLCALC, TRIG, CHOLHDL, LDLDIRECT in the last 72 hours. Thyroid Function Tests: No results for input(s): TSH, T4TOTAL, FREET4, T3FREE, THYROIDAB in the last 72 hours. Anemia Panel: No results for input(s): VITAMINB12, FOLATE, FERRITIN, TIBC, IRON, RETICCTPCT in the last 72 hours. Sepsis Labs: Recent Labs  Lab 08/10/20 1842 08/10/20 2026  PROCALCITON 0.48  --   LATICACIDVEN 1.1 1.1    Recent Results (from the past 240 hour(s))  Blood culture (single)     Status: None   Collection Time: 08/10/20  6:42 PM   Specimen: BLOOD  Result Value Ref Range Status   Specimen Description BLOOD RIGHT ANTECUBITAL  Final   Special Requests   Final    BOTTLES DRAWN AEROBIC AND ANAEROBIC Blood Culture adequate volume   Culture   Final    NO GROWTH 5 DAYS Performed at [facility], [address], [place], [place] [address]    Report Status 08/15/2020 FINAL  Final  Blood culture (single)     Status: None   Collection Time:  08/10/20  8:26 PM   Specimen: BLOOD  Result Value Ref Range Status  Specimen Description BLOOD BLOOD LEFT FOREARM  Final   Special Requests   Final    BOTTLES DRAWN AEROBIC AND ANAEROBIC Blood Culture results may not be optimal due to an excessive volume of blood received in culture bottles   Culture   Final    NO GROWTH 5 DAYS Performed at [facility], [address] [place], [place] [address]    Report Status 08/15/2020 FINAL  Final  Resp Panel by RT-PCR (Flu A&B, Covid) Nasopharyngeal Swab     Status: None   Collection Time: 08/10/20  8:26 PM   Specimen: Nasopharyngeal Swab; Nasopharyngeal(NP) swabs in vial transport medium  Result Value Ref Range Status   SARS Coronavirus 2 by RT PCR NEGATIVE NEGATIVE Final    Comment: (NOTE) SARS-CoV-2 target nucleic acids are NOT DETECTED.  The SARS-CoV-2 RNA is generally detectable in upper respiratory specimens during the acute phase of infection. The lowest concentration of SARS-CoV-2 viral copies this assay can detect is 138 copies/mL. A negative result does not preclude SARS-Cov-2 infection and should not be used as the sole basis for treatment or other patient management decisions. A negative result may occur with  improper specimen collection/handling, submission of specimen other than nasopharyngeal swab, presence of viral mutation(s) within the areas targeted by this assay, and inadequate number of viral copies(<138 copies/mL). A negative result must be combined with clinical observations, patient history, and epidemiological information. The expected result is Negative.  Fact Sheet for Patients:  BloggerCourse.com  Fact Sheet for Healthcare Providers:  SeriousBroker.it  This test is no t yet approved or cleared by the [place] FDA and  has been authorized for detection and/or diagnosis of SARS-CoV-2 by FDA under an Emergency Use Authorization (EUA). This EUA  will remain  in effect (meaning this test can be used) for the duration of the COVID-19 declaration under Section 564(b)(1) of the Act, 21 U.S.C.section 360bbb-3(b)(1), unless the authorization is terminated  or revoked sooner.       Influenza A by PCR NEGATIVE NEGATIVE Final   Influenza B by PCR NEGATIVE NEGATIVE Final    Comment: (NOTE) The Xpert Xpress SARS-CoV-2/FLU/RSV plus assay is intended as an aid in the diagnosis of influenza from Nasopharyngeal swab specimens and should not be used as a sole basis for treatment. Nasal washings and aspirates are unacceptable for Xpert Xpress SARS-CoV-2/FLU/RSV testing.  Fact Sheet for Patients: BloggerCourse.com  Fact Sheet for Healthcare Providers: SeriousBroker.it  This test is not yet approved or cleared by the [place] FDA and has been authorized for detection and/or diagnosis of SARS-CoV-2 by FDA under an Emergency Use Authorization (EUA). This EUA will remain in effect (meaning this test can be used) for the duration of the COVID-19 declaration under Section 564(b)(1) of the Act, 21 U.S.C. section 360bbb-3(b)(1), unless the authorization is terminated or revoked.  Performed at [facility], [address], [place], [place] [address]   Surgical PCR screen     Status: None   Collection Time: 08/14/20 11:50 AM   Specimen: Nasal Mucosa; Nasal Swab  Result Value Ref Range Status   MRSA, PCR NEGATIVE NEGATIVE Final   Staphylococcus aureus NEGATIVE NEGATIVE Final    Comment: (NOTE) The Xpert SA Assay (FDA approved for NASAL specimens in patients 60 years of age and older), is one component of a comprehensive surveillance program. It is not intended to diagnose infection nor to guide or monitor treatment. Performed at [facility], [address], [place], [place] [address]  Body fluid culture w Gram Stain     Status: None (Preliminary result)   Collection  Time: 08/15/20  1:48 PM   Specimen: Body Fluid  Result Value Ref Range Status   Specimen Description FLUID PLEURAL LEFT  Final   Special Requests NONE  Final   Gram Stain   Final    ABUNDANT WBC PRESENT,BOTH PMN AND MONONUCLEAR MODERATE GRAM POSITIVE COCCI    Culture   Final    NO GROWTH < 24 HOURS Performed at [facility], [address], [place], [place] [address]    Report Status PENDING  Incomplete    Radiology Studies: DG CHEST PORT 1 VIEW  Result Date: 08/16/2020 CLINICAL DATA:  Empyema. EXAM: PORTABLE CHEST 1 VIEW COMPARISON:  August 15, 2020. FINDINGS: Stable cardiomegaly. Left-sided chest tube is unchanged in position. No pneumothorax is noted. Stable residual left pleural effusion is noted with associated atelectasis or infiltrate. Right lung is unremarkable. Bony thorax is unremarkable. IMPRESSION: Left-sided chest tube is unchanged in position. Stable residual left pleural effusion is noted with associated atelectasis or infiltrate. Electronically Signed   By: Lupita Raider M.D.   On: 08/16/2020 08:24   DG Chest Port 1 View  Result Date: 08/15/2020 CLINICAL DATA:  Post VATS EXAM: PORTABLE CHEST 1 VIEW COMPARISON:  08/10/2020 FINDINGS: Left chest tube in place. No pneumothorax. Left pleural effusion and left mid and lower lung airspace disease, similar to prior study. No confluent opacity on the right. Mild cardiomegaly. IMPRESSION: Left chest tube in place without pneumothorax. Continued left effusion with left lung atelectasis or infiltrates. Electronically Signed   By: Charlett Nose M.D.   On: 08/15/2020 16:30   VAS [place] ABI WITH/WO TBI  Result Date: 08/16/2020 LOWER EXTREMITY DOPPLER STUDY Indications: Claudication, and peripheral artery disease. High Risk Factors: Hypertension, hyperlipidemia, Diabetes, current smoker.  Vascular Interventions: SFA Stent. Comparison Study: Previous 2/22 Normal Performing Technologist: Clint Guy RVT  Examination Guidelines: A complete  evaluation includes at minimum, Doppler waveform signals and systolic blood pressure reading at the level of bilateral brachial, anterior tibial, and posterior tibial arteries, when vessel segments are accessible. Bilateral testing is considered an integral part of a complete examination. Photoelectric Plethysmograph (PPG) waveforms and toe systolic pressure readings are included as required and additional duplex testing as needed. Limited examinations for reoccurring indications may be performed as noted.  ABI Findings: +--------+------------------+-----+---------+--------+ Right   Rt Pressure (mmHg)IndexWaveform Comment  +--------+------------------+-----+---------+--------+ [id number]                    triphasic         +--------+------------------+-----+---------+--------+ PTA     130               1.23 triphasic         +--------+------------------+-----+---------+--------+ DP      121               1.14 triphasic         +--------+------------------+-----+---------+--------+ +--------+------------------+-----+---------+------------+ Left    Lt Pressure (mmHg)IndexWaveform Comment      +--------+------------------+-----+---------+------------+ Brachial                                Multiple IVs +--------+------------------+-----+---------+------------+ PTA     133               1.25 biphasic              +--------+------------------+-----+---------+------------+  DP      105               0.99 triphasic             +--------+------------------+-----+---------+------------+ +-------+-----------+-----------+------------+------------+ ABI/TBIToday's ABIToday's TBIPrevious ABIPrevious TBI +-------+-----------+-----------+------------+------------+ Right  1.23                  1.08                     +-------+-----------+-----------+------------+------------+ Left   1.25                  1.11                      +-------+-----------+-----------+------------+------------+ Right ABIs appear increased. Left ABIs appear increased.  Summary: Right: Resting right ankle-brachial index is within normal range. No evidence of significant right lower extremity arterial disease. Left: Resting left ankle-brachial index is within normal range. No evidence of significant left lower extremity arterial disease.  *See table(s) above for measurements and observations.     Preliminary    VAS [place] LOWER EXTREMITY VENOUS (DVT)  Result Date: 08/16/2020  Lower Venous DVT Study Indications: Pain.  Risk Factors: None identified. Comparison Study: No previous venous exams Performing Technologist: Clint Guy RVT  Examination Guidelines: A complete evaluation includes B-mode imaging, spectral Doppler, color Doppler, and power Doppler as needed of all accessible portions of each vessel. Bilateral testing is considered an integral part of a complete examination. Limited examinations for reoccurring indications may be performed as noted. The reflux portion of the exam is performed with the patient in reverse Trendelenburg.  +---------+---------------+---------+-----------+----------+--------------+ LEFT     CompressibilityPhasicitySpontaneityPropertiesThrombus Aging +---------+---------------+---------+-----------+----------+--------------+ CFV      Full           Yes      Yes                                 +---------+---------------+---------+-----------+----------+--------------+ SFJ      Full                                                        +---------+---------------+---------+-----------+----------+--------------+ FV Prox  Full                                                        +---------+---------------+---------+-----------+----------+--------------+ FV Mid   Full                                                        +---------+---------------+---------+-----------+----------+--------------+ FV  DistalFull                                                        +---------+---------------+---------+-----------+----------+--------------+ PFV  Full                                                        +---------+---------------+---------+-----------+----------+--------------+ POP      Full           Yes      Yes                                 +---------+---------------+---------+-----------+----------+--------------+ PTV      Full                                                        +---------+---------------+---------+-----------+----------+--------------+ PERO     Full                                                        +---------+---------------+---------+-----------+----------+--------------+ Soleal   None                                         Acute          +---------+---------------+---------+-----------+----------+--------------+   Summary: LEFT: - Findings consistent with acute deep vein thrombosis involving the left soleal veins. - No cystic structure found in the popliteal fossa.  *See table(s) above for measurements and observations.    Preliminary      LOS: 5 days   Lanae Boastamesh Raza Bayless, MD [facility]  08/16/2020, 10:34 AM

## 2020-08-17 ENCOUNTER — Inpatient Hospital Stay (HOSPITAL_COMMUNITY): Payer: Medicaid Other

## 2020-08-17 LAB — GLUCOSE, CAPILLARY
Glucose-Capillary: 187 mg/dL — ABNORMAL HIGH (ref 70–99)
Glucose-Capillary: 230 mg/dL — ABNORMAL HIGH (ref 70–99)
Glucose-Capillary: 162 mg/dL — ABNORMAL HIGH (ref 70–99)
Glucose-Capillary: 236 mg/dL — ABNORMAL HIGH (ref 70–99)

## 2020-08-17 LAB — COMPREHENSIVE METABOLIC PANEL
ALT: 11 U/L (ref 0–44)
AST: 12 U/L — ABNORMAL LOW (ref 15–41)
Albumin: 1.6 g/dL — ABNORMAL LOW (ref 3.5–5.0)
Alkaline Phosphatase: 115 U/L (ref 38–126)
Anion gap: 8 (ref 5–15)
BUN: 14 mg/dL (ref 6–20)
CO2: 29 mmol/L (ref 22–32)
Calcium: 7.7 mg/dL — ABNORMAL LOW (ref 8.9–10.3)
Chloride: 96 mmol/L — ABNORMAL LOW (ref 98–111)
Creatinine, Ser: 1.05 mg/dL (ref 0.61–1.24)
GFR, Estimated: >60 mL/min (ref >60)
Glucose, Bld: 213 mg/dL — ABNORMAL HIGH (ref 70–99)
Potassium: 4.2 mmol/L (ref 3.5–5.1)
Sodium: 133 mmol/L — ABNORMAL LOW (ref 135–145)
Total Bilirubin: 0.5 mg/dL (ref 0.3–1.2)
Total Protein: 5.6 g/dL — ABNORMAL LOW (ref 6.5–8.1)

## 2020-08-17 LAB — CBC
HCT: 29.5 % — ABNORMAL LOW (ref 39.0–52.0)
Hemoglobin: 9.5 g/dL — ABNORMAL LOW (ref 13.0–17.0)
MCH: 31.6 pg (ref 26.0–34.0)
MCHC: 32.2 g/dL (ref 30.0–36.0)
MCV: 98 fL (ref 80.0–100.0)
Platelets: 558 10*3/uL — ABNORMAL HIGH (ref 150–400)
RBC: 3.01 MIL/uL — ABNORMAL LOW (ref 4.22–5.81)
RDW: 13.5 % (ref 11.5–15.5)
WBC: 11.9 10*3/uL — ABNORMAL HIGH (ref 4.0–10.5)
nRBC: 0 % (ref 0.0–0.2)

## 2020-08-17 LAB — HEPARIN LEVEL (UNFRACTIONATED)
Heparin Unfractionated: <0.1 IU/mL — ABNORMAL LOW (ref 0.30–0.70)
Heparin Unfractionated: 0.1 IU/mL — ABNORMAL LOW (ref 0.30–0.70)
Heparin Unfractionated: 0.1 IU/mL — ABNORMAL LOW (ref 0.30–0.70)

## 2020-08-17 MED ORDER — BISACODYL 10 MG RE SUPP: 10.0000 mg | Freq: Every day | RECTAL | Status: DC | PRN

## 2020-08-17 MED ORDER — POLYETHYLENE GLYCOL 3350 17 G PO PACK
17.0000 g | PACK | Freq: Every day | ORAL | Status: DC
  Administered 2020-08-17 – 2020-08-18 (×2): 17 g via ORAL
  Filled 2020-08-17 (×7): qty 1

## 2020-08-17 MED ORDER — HEPARIN (PORCINE) 25000 UT/250ML-% IV SOLN
3100.0000 [IU]/h | INTRAVENOUS | Status: DC
  Administered 2020-08-17: 2300 [IU]/h via INTRAVENOUS
  Administered 2020-08-18: 2600 [IU]/h via INTRAVENOUS
  Administered 2020-08-18: 2900 [IU]/h via INTRAVENOUS
  Administered 2020-08-19 – 2020-08-20 (×4): 3000 [IU]/h via INTRAVENOUS
  Administered 2020-08-21: 3100 [IU]/h via INTRAVENOUS
  Administered 2020-08-21: 3000 [IU]/h via INTRAVENOUS
  Administered 2020-08-21 – 2020-08-24 (×7): 3100 [IU]/h via INTRAVENOUS
  Filled 2020-08-17 (×21): qty 250

## 2020-08-17 MED ORDER — SACCHAROMYCES BOULARDII 250 MG PO CAPS
250.0000 mg | ORAL_CAPSULE | Freq: Two times a day (BID) | ORAL | Status: DC
  Administered 2020-08-17 – 2020-08-24 (×15): 250 mg via ORAL
  Filled 2020-08-17 (×15): qty 1

## 2020-08-17 MED ORDER — LIDOCAINE 5 % EX PTCH
1.0000 | MEDICATED_PATCH | CUTANEOUS | Status: AC
  Administered 2020-08-17 – 2020-08-19 (×3): 1 via TRANSDERMAL
  Filled 2020-08-17 (×4): qty 1

## 2020-08-17 NOTE — Progress Notes (Signed)
ANTICOAGULATION CONSULT NOTE - Follow Up Consult  Pharmacy Consult for IV Heparin Indication: DVT  Allergies  Allergen Reactions  . Bee Venom Anaphylaxis  . Coffea Arabica Shortness Of Breath  . Coffee Flavor Shortness Of Breath  . Cheese Swelling    Blue Cheese only    Patient Measurements: Height: 5\' 10"  (177.8 cm) Weight: 93.3 kg (205 lb 11 oz) IBW/kg (Calculated) : 73 Heparin Dosing Weight: 93 kg  Vital Signs: Temp: 98 F (36.7 C) (03/02 1110) Temp Source: Oral (03/02 1110) BP: 116/69 (03/02 1110) Pulse Rate: 68 (03/02 1110)  Labs: Recent Labs    08/15/20 0332 08/15/20 1446 08/16/20 0054 08/16/20 1728 08/17/20 0105 08/17/20 1029  HGB 10.9* 10.2* 10.3*  --  9.5*  --   HCT 33.7* 30.0* 33.2*  --  29.5*  --   PLT 476*  --  491*  --  558*  --   HEPARINUNFRC  --   --   --  <0.10* <0.10* 0.10*  CREATININE 0.84  --  1.23  --  1.05  --     Estimated Creatinine Clearance: 88 mL/min (by C-G formula based on SCr of 1.05 mg/dL).   Medical History: Past Medical History:  Diagnosis Date  . CHF (congestive heart failure) (HCC)   . Diabetes mellitus without complication (HCC)   . Hypercholesteremia   . Hypertension    Assessment: 60 yr old man admitted with empyema, S/P VATs 2/28, now found with acute L DVT. Pharmacy was consulted to dose IV heparin with no bolus given; pt was on no anticoagulant PTA. Chest tubes remain in place.  -heparin level = 0.1 -hg= 9.5    Goal of Therapy:  Heparin level 0.3-0.7 units/ml Monitor platelets by anticoagulation protocol: Yes   Plan:  Increase heparin infusion to 2350 units/hr Check heparin level in 6 hrs Monitor daily heparin level, CBC  3/28, PharmD Clinical Pharmacist **Pharmacist phone directory can now be found on [email or web address] ([email or web address]).  Listed under [facility].

## 2020-08-17 NOTE — Plan of Care (Signed)
  Problem: Education: Goal: Knowledge of General Education information will improve Description: Including pain rating scale, medication(s)/side effects and non-pharmacologic comfort measures Outcome: Progressing   Problem: Health Behavior/Discharge Planning: Goal: Ability to manage health-related needs will improve Outcome: Progressing   Problem: Clinical Measurements: Goal: Respiratory complications will improve Outcome: Progressing   Problem: Activity: Goal: Risk for activity intolerance will decrease Outcome: Progressing   Problem: Nutrition: Goal: Adequate nutrition will be maintained Outcome: Progressing   Problem: Coping: Goal: Level of anxiety will decrease Outcome: Progressing   Problem: Pain Managment: Goal: General experience of comfort will improve Outcome: Progressing   Problem: Skin Integrity: Goal: Risk for impaired skin integrity will decrease Outcome: Progressing   Problem: Coping: Goal: Ability to adjust to condition or change in health will improve Outcome: Progressing   Problem: Fluid Volume: Goal: Ability to maintain a balanced intake and output will improve Outcome: Progressing

## 2020-08-17 NOTE — Plan of Care (Signed)
?  Problem: Clinical Measurements: ?Goal: Ability to maintain clinical measurements within normal limits will improve ?Outcome: Progressing ?Goal: Respiratory complications will improve ?Outcome: Progressing ?  ?Problem: Pain Managment: ?Goal: General experience of comfort will improve ?Outcome: Progressing ?  ?Problem: Safety: ?Goal: Ability to remain free from injury will improve ?Outcome: Progressing ?  ?

## 2020-08-17 NOTE — Progress Notes (Signed)
PROGRESS NOTE  Timothy [name]. OVF:[id number] DOB: 25-Dec-1960 DOA: 08/11/2020 PCP: Rolm Gala, NP  HPI/Recap of past 66 hours: 60 year old male with multiple medical problems-HTN, T2DM, chronic diastolic CHF, HX OF osteomyelitis status post amputation of left great toe 10/21, PTSD/antisocial personality disorder, GERD, HLD obesity transferred from [facility] with acute hypoxic respiratory failure due to left-sided empyema needing CT surgery consult at [facility].  Patient was being managed with IV Zosyn.  CT Sx was consulted at [facility]. 2.28- s/p  VATS/decoritication on left:  08/17/20:  Seen and examined at his bedside.  Reports LLE pain, he is on heparin drip.  Denies any chest pain.  Chest tube in place, 180 cc from drain output in last 24H.  Assessment/Plan: Principal Problem:   Empyema (HCC) Active Problems:   Essential hypertension   Type 2 diabetes mellitus with hyperglycemia (HCC)   Respiratory failure with hypoxia (HCC)   Hyponatremia   Chronic diastolic CHF (congestive heart failure) (HCC)   Anemia   Abdominal pain   Empyema lung (HCC)  Acute hypoxic respiratory failure 2/2 empyema Left-sided empyema   Sepsis POA secondary to empyema-sepsis resolved S/p VATS w/ decortication on the left by Dr. Cliffton Asters.patient is afebrile but WBC trending up.  Continue IV Zosyn, supplemental [place] to maintain spo2 >94,pulmonary toiletting.  Chest tube  Placed to waterseal this morning by CT surgery.  Added probiotics Florastor BID. 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  But found to have left soleal  DVT-discussed with Dr. Cliffton Asters okay to start heparin without bolus.  Home plavix on hold.  Left soleal DVT: Starting heparin without bolus ( 2/2 chest tube), discussed with thoracic surgery.  Essential hypertension/HLD:BP soft. Hold amlodipine, on clonidine bedtime for anxiety ( can hold if hypotension-RN informed). On Crestor for hyperlipidemia.  T2DM with uncontrolled hyperglycemia on presentation needing IV insulin infusion.  Patient transitioned to Lantus and sliding scale.  Hemoglobin A1c 7.7.  Sugars well controlled.  Continue Lantus 10 units and sliding scale. Cont to hold home glipizide and Metformin monitor CBG. Last Labs          Recent Labs  Lab 08/15/20 1101 08/15/20 1618 08/15/20 2106 08/16/20 0615 08/16/20 0757  GLUCAP 160* 164* 179* 195* 191*     Hyponatremia, mildly low at 132>130, monitor  Chronic diastolic CHF: Monitor intake output as below. On Entresto, Plavix at home and remains on hold. Net IO Since Admission: 712.07 mL [08/16/20 1034]  Chronic Anemia-iron deficiency anemia iron supplementation at discharge.  Monitor hemoglobin here Last Labs          Recent Labs  Lab 08/12/20 0218 08/13/20 0226 08/15/20 0332 08/15/20 1446 08/16/20 0054  HGB 10.9* 11.0* 10.9* 10.2* 10.3*  HCT 33.3* 33.5* 33.7* 30.0* 33.2*     Left-sided abdominal pain/perinephric STRANDING on CT abdomen RT> LT: no obvious pyelonephritis - patient is being treated with IV Zosyn, repeat imaging in  3 to 4 weeks.  Symptomatic management  PTSD/antisocial personality disorder: mood stable on Celexa.  GERD:Cont ppi  Overweight with BMI 29.5: Outpatient PCP follow-up for weight loss  Constipation:  Bowel regimen added   DVT prophylaxis:  Heparin drip.  Code Status:  Full Code  Family Communication: plan of care discussed with patient.    Status is: Inpatient    Dispo:  Patient From: Home  Planned Disposition: Home  Medically stable for discharge: No, ongoing  management of left sided chest tube, empyema, left lower extremity DVT.          Objective: Vitals:   08/17/20 0300 08/17/20 0734 08/17/20 0904 08/17/20 1110  BP: 95/72 104/71 101/72 116/69  Pulse: 68 67  68  Resp: 15 14  20   Temp: 97.8 F (36.6 C) 97.9 F (36.6 C)  98 F (36.7 C)  TempSrc: Oral Oral  Oral  SpO2: 95% 98% 100%   Weight:      Height:        Intake/Output Summary (Last 24 hours) at 08/17/2020 1153 Last data filed at 08/17/2020 1135 Gross per 24 hour  Intake 2413.06 ml  Output 1045 ml  Net 1368.06 ml   Filed Weights   08/11/20 2120  Weight: 93.3 kg    Exam:  . General: 60 y.o. year-old male well developed well nourished in no acute distress.  Alert and oriented x3. . Cardiovascular: Regular rate and rhythm with no rubs or gallops.  No thyromegaly or JVD noted.   Marland Kitchen. Respiratory: Mild rales at bases no wheezing noted.  Poor inspiratory effort.  Left-sided chest tube in place.  .  Abdomen: Soft nontender nondistended with normal bowel sounds x4 quadrants. . Musculoskeletal: No lower extremity edema bilaterally. . Skin: No ulcerative lesions noted or rashes, . Psychiatry: Mood is appropriate for condition and setting   Data Reviewed: CBC: Recent Labs  Lab 08/10/20 1842 08/11/20 1934 08/12/20 0218 08/13/20 0226 08/15/20 0332 08/15/20 1446 08/16/20 0054 08/17/20 0105  WBC 13.5*   < > 14.9* 9.9 11.6*  --  14.1* 11.9*  NEUTROABS 11.2*  --   --  7.3  --   --   --   --   HGB 11.9*   < > 10.9* 11.0* 10.9* 10.2* 10.3* 9.5*  HCT 35.4*   < > 33.3* 33.5* 33.7* 30.0* 33.2* 29.5*  MCV 95.4   < > 96.2 97.4 97.1  --  100.0 98.0  PLT 427*   < > 474* 453* 476*  --  491* 558*   < > = values in this interval not displayed.   Basic Metabolic Panel: Recent Labs  Lab 08/12/20 2223 08/13/20 0226 08/15/20 0332 08/15/20 1446 08/16/20 0054 08/17/20 0105  NA 134* 133* 132* 132* 130* 133*  K 4.1 4.0 4.3 4.2 4.5 4.2  CL 96* 96* 94*  --  93* 96*  CO2 27 28 29   --   28 29  GLUCOSE 136* 154* 216*  --  226* 213*  BUN 13 12 9   --  16 14  CREATININE 1.04 0.92 0.84  --  1.23 1.05  CALCIUM 8.4* 8.2* 8.4*  --  8.0* 7.7*   GFR: Estimated Creatinine Clearance: 88 mL/min (by C-G formula based on SCr of 1.05 mg/dL). Liver Function Tests: Recent Labs  Lab 08/10/20 1842 08/13/20 0226 08/16/20 0054 08/17/20 0105  AST 17 17 15  12*  ALT 18 17 9 11   ALKPHOS 139* 102 90 115  BILITOT 1.4* 0.7 0.7 0.5  PROT 7.4 5.8* 5.9* 5.6*  ALBUMIN 2.6* 1.8* 1.8* 1.6*   No results for input(s): LIPASE, AMYLASE in the last 168 hours. No results for input(s): AMMONIA in the last 168 hours. Coagulation Profile: Recent Labs  Lab 08/14/20 1033  INR 1.2   Cardiac Enzymes: No results for input(s): CKTOTAL, CKMB, CKMBINDEX, TROPONINI in the last 168 hours. BNP (last 3 results) No results for input(s): PROBNP in the last 8760 hours. HbA1C: No results for input(s): HGBA1C in the last 72 hours. CBG: Recent Labs  Lab 08/16/20 1105 08/16/20 1618 08/16/20 2123 08/17/20 0621 08/17/20 1112  GLUCAP 205* 276* 210* 187* 230*   Lipid Profile: No results for input(s): CHOL, HDL, LDLCALC, TRIG, CHOLHDL, LDLDIRECT in the last 72 hours. Thyroid Function Tests: No results for input(s): TSH, T4TOTAL, FREET4, T3FREE, THYROIDAB in the last 72 hours. Anemia Panel: No results for input(s): VITAMINB12, FOLATE, FERRITIN, TIBC, IRON, RETICCTPCT in the last 72 hours. Urine analysis:    Component Value Date/Time   COLORURINE YELLOW 08/12/2020 1330   APPEARANCEUR HAZY (A) 08/12/2020 1330   APPEARANCEUR Cloudy (A) 01/13/2020 1156   LABSPEC 1.024 08/12/2020 1330   PHURINE 5.0 08/12/2020 1330   GLUCOSEU NEGATIVE 08/12/2020 1330   HGBUR SMALL (A) 08/12/2020 1330   BILIRUBINUR NEGATIVE 08/12/2020 1330   BILIRUBINUR Negative 01/13/2020 1156   KETONESUR NEGATIVE 08/12/2020 1330   PROTEINUR NEGATIVE 08/12/2020 1330   NITRITE NEGATIVE 08/12/2020 1330   LEUKOCYTESUR NEGATIVE 08/12/2020 1330    Sepsis Labs: @LABRCNTIP (procalcitonin:4,lacticidven:4)  ) Recent Results (from the past 240 hour(s))  Blood culture (single)     Status: None   Collection Time: 08/10/20  6:42 PM   Specimen: BLOOD  Result Value Ref Range Status   Specimen Description BLOOD RIGHT ANTECUBITAL  Final   Special Requests   Final    BOTTLES DRAWN AEROBIC AND ANAEROBIC Blood Culture adequate volume   Culture   Final    NO GROWTH 5 DAYS Performed at [facility], [address] [place], [place]    Report Status 08/15/2020 FINAL  Final  Blood culture (single)     Status: None   Collection Time: 08/10/20  8:26 PM   Specimen: BLOOD  Result Value Ref Range Status   Specimen Description BLOOD BLOOD LEFT FOREARM  Final   Special Requests   Final    BOTTLES DRAWN AEROBIC AND ANAEROBIC Blood Culture results may not be optimal due to an excessive volume of blood received in culture bottles   Culture   Final    NO GROWTH 5 DAYS Performed at [facility], [address] [place], [place]    Report Status 08/15/2020 FINAL  Final  Resp Panel by RT-PCR (Flu A&B, Covid) Nasopharyngeal Swab     Status: None   Collection Time: 08/10/20  8:26 PM   Specimen: Nasopharyngeal Swab; Nasopharyngeal(NP) swabs in vial transport medium  Result Value Ref Range Status   SARS Coronavirus 2 by RT PCR NEGATIVE NEGATIVE Final    Comment: (NOTE) SARS-CoV-2 target nucleic acids are NOT DETECTED.  The SARS-CoV-2 RNA is generally detectable in upper respiratory specimens during the acute phase of infection. The lowest concentration of SARS-CoV-2 viral copies this assay can detect is 138 copies/mL. A negative result does not preclude SARS-Cov-2 infection and should not be used as the sole basis for treatment or other patient management decisions. A negative result may occur with  improper specimen collection/handling, submission of specimen other than nasopharyngeal swab, presence of viral  mutation(s) within the areas targeted by this assay, and inadequate number of viral copies(<138 copies/mL). A negative result must be combined with clinical observations, patient history, and epidemiological information. The expected result is Negative.  Fact Sheet for Patients:  BloggerCourse.com  Fact Sheet for Healthcare Providers:  SeriousBroker.it  This test is no t yet approved or cleared by the [place] FDA and  has been authorized for detection and/or diagnosis of SARS-CoV-2 by FDA under an Emergency Use Authorization (EUA). This EUA will remain  in effect (meaning this test can be used) for the duration of the COVID-19 declaration under Section 564(b)(1) of the Act, 21 U.S.C.section 360bbb-3(b)(1), unless the authorization is terminated  or revoked sooner.       Influenza A by PCR NEGATIVE NEGATIVE Final   Influenza B by PCR NEGATIVE NEGATIVE Final    Comment: (NOTE) The Xpert Xpress SARS-CoV-2/FLU/RSV plus assay is intended as an aid in the diagnosis of influenza from Nasopharyngeal swab specimens and should not be used as a sole basis for treatment. Nasal washings and aspirates are unacceptable for Xpert Xpress SARS-CoV-2/FLU/RSV testing.  Fact Sheet for Patients: BloggerCourse.com  Fact Sheet for Healthcare Providers: SeriousBroker.it  This test is not yet approved or cleared by the [place] FDA and has been authorized for detection and/or diagnosis of SARS-CoV-2 by FDA under an Emergency Use Authorization (EUA). This EUA will remain in effect (meaning this test can be used) for the duration of the COVID-19 declaration under Section 564(b)(1) of the Act, 21 U.S.C. section 360bbb-3(b)(1), unless the authorization is terminated or revoked.  Performed at [facility], [address], [place], [place] [address]   Surgical PCR screen     Status:  None   Collection Time: 08/14/20 11:50 AM   Specimen: Nasal Mucosa; Nasal Swab  Result Value Ref Range Status   MRSA, PCR NEGATIVE NEGATIVE Final   Staphylococcus aureus NEGATIVE NEGATIVE Final    Comment: (NOTE) The Xpert SA Assay (FDA approved for NASAL specimens in patients 39 years of age and older), is one component of a comprehensive surveillance program. It is not intended to diagnose infection nor to guide or monitor treatment. Performed at [facility], [address] [place], [place] [address]   Body fluid culture w Gram Stain     Status: None (Preliminary result)   Collection Time: 08/15/20  1:48 PM   Specimen: Body Fluid  Result Value Ref Range Status   Specimen Description FLUID PLEURAL LEFT  Final   Special Requests NONE  Final   Gram Stain   Final    ABUNDANT WBC PRESENT,BOTH PMN AND MONONUCLEAR MODERATE GRAM POSITIVE COCCI    Culture   Final    CULTURE REINCUBATED FOR BETTER GROWTH Performed at [facility], [address] [place], [place] [address]    Report Status PENDING  Incomplete      Studies: DG CHEST PORT 1 VIEW  Result Date: 08/17/2020 CLINICAL DATA:  Chest tube, postop EXAM: PORTABLE CHEST 1 VIEW COMPARISON:  Portable exam 0808 hours compared to 08/16/2020 FINDINGS: LEFT thoracostomy tube unchanged. Enlargement of cardiac silhouette. Stable mediastinal contours and pulmonary vascularity. Persistent effusion at lower lateral LEFT hemithorax with atelectasis versus consolidation in lower LEFT lung. Remaining lungs clear. No pneumothorax. IMPRESSION: Persistent atelectasis versus consolidation in lower LEFT lung with LEFT pleural effusion. Electronically Signed   By: Ulyses Southward M.D.   On: 08/17/2020 08:20    Scheduled Meds: . acetaminophen  1,000 mg  Oral Q6H   Or  . acetaminophen (TYLENOL) oral liquid 160 mg/5 mL  1,000 mg Oral Q6H  . amLODipine  5 mg Oral Daily  . bisacodyl  10 mg Oral Daily  . citalopram  20 mg Oral Daily  .  cloNIDine  0.1 mg Oral QHS  . gabapentin  300 mg Oral TID  . guaiFENesin  1,200 mg Oral BID  . insulin aspart  0-15 Units Subcutaneous TID WC  . insulin aspart  0-5 Units Subcutaneous QHS  . insulin glargine  10 Units Subcutaneous Daily  . magnesium oxide  400 mg Oral Daily  . nicotine  21 mg Transdermal Daily  . pantoprazole  40 mg Oral Daily  . polyethylene glycol  17 g Oral Daily  . rosuvastatin  5 mg Oral Daily  . saccharomyces boulardii  250 mg Oral BID  . senna  1 tablet Oral Daily    Continuous Infusions: . heparin 2,050 Units/hr (08/17/20 1135)  . lactated ringers Stopped (08/15/20 1524)  . piperacillin-tazobactam (ZOSYN)  IV 3.375 g (08/17/20 0445)     LOS: 6 days     Darlin Drop, MD [facility] Pager [phone]  If 7PM-7AM, please contact night-coverage [email or web address] Password [facility] 08/17/2020, 11:53 AM

## 2020-08-17 NOTE — Progress Notes (Addendum)
ANTICOAGULATION CONSULT NOTE - Follow Up Consult  Pharmacy Consult for IV Heparin Indication: DVT  Allergies  Allergen Reactions  . Bee Venom Anaphylaxis  . Coffea Arabica Shortness Of Breath  . Coffee Flavor Shortness Of Breath  . Cheese Swelling    Blue Cheese only    Patient Measurements: Height: 5\' 10"  (177.8 cm) Weight: 93.3 kg (205 lb 11 oz) IBW/kg (Calculated) : 73 Heparin Dosing Weight: 93 kg  Vital Signs: Temp: 98.6 F (37 C) (03/02 1941) Temp Source: Oral (03/02 1941) BP: 110/66 (03/02 1941) Pulse Rate: 71 (03/02 1941)  Labs: Recent Labs    08/15/20 0332 08/15/20 1446 08/16/20 0054 08/16/20 1728 08/17/20 0105 08/17/20 1029 08/17/20 2023  HGB 10.9* 10.2* 10.3*  --  9.5*  --   --   HCT 33.7* 30.0* 33.2*  --  29.5*  --   --   PLT 476*  --  491*  --  558*  --   --   HEPARINUNFRC  --   --   --    < > <0.10* 0.10* 0.10*  CREATININE 0.84  --  1.23  --  1.05  --   --    < > = values in this interval not displayed.    Estimated Creatinine Clearance: 88 mL/min (by C-G formula based on SCr of 1.05 mg/dL).   Medical History: Past Medical History:  Diagnosis Date  . CHF (congestive heart failure) (HCC)   . Diabetes mellitus without complication (HCC)   . Hypercholesteremia   . Hypertension    Assessment: 60 yr old man admitted with empyema, S/P VATs 2/28, now found with acute L DVT. Pharmacy was consulted to dose IV heparin with no bolus given; pt was on no anticoagulant PTA. Chest tubes remain in place.   Heparin level ~6 hrs after heparin infusion was increased to 2300 units/hr was 0.10 units/ml, which is below the goal range for this pt (no change in heparin level from prior heparin level, despite increasing heparin infusion). H/H 9.5/29.5 (down today; per CT surgery, may be related to hemorrhagic component of effusion/empyema), plt 558. Per RN, no issues with IV or bleeding observed.  Goal of Therapy:  Heparin level 0.3-0.7 units/ml Monitor platelets by  anticoagulation protocol: Yes   Plan:  Increase heparin infusion to 2600 units/hr Check heparin level in 6 hrs Monitor daily heparin level, CBC Monitor for bleeding  02-06-1989, PharmD, BCPS, [facility] Clinical Pharmacist

## 2020-08-17 NOTE — Progress Notes (Addendum)
[address]       [place] [address]             [phone]      2 Days Post-Op Procedure(s) (LRB): VIDEO ASSISTED THORACOSCOPY (VATS)/DECORTICATION (Left) Subjective: Feels ok, some LLE tenderness  Objective: Vital signs in last 24 hours: Temp:  [97.8 F (36.6 C)-98.2 F (36.8 C)] 97.9 F (36.6 C) (03/02 0734) Pulse Rate:  [66-87] 67 (03/02 0734) Cardiac Rhythm: Normal sinus rhythm (03/02 0700) Resp:  [13-18] 14 (03/02 0734) BP: (85-118)/(60-72) 104/71 (03/02 0734) SpO2:  [94 %-99 %] 98 % (03/02 0734)  Hemodynamic parameters for last 24 hours:    Intake/Output from previous day: 03/01 0701 - 03/02 0700 In: 1912.7 [P.O.:600; I.V.:656.5; IV Piggyback:656.2] Out: 1020 [Urine:875; Chest Tube:145] Intake/Output this shift: No intake/output data recorded.  PE:  Alert, NAD Lungs dim left base, + ronchi, I,proved with cough Cor : RRR Abd: minor tenderness LLQ Ext: LLE calf tenderness, no edema Incisions- dressings CDI      Lab Results: Recent Labs    08/16/20 0054 08/17/20 0105  WBC 14.1* 11.9*  HGB 10.3* 9.5*  HCT 33.2* 29.5*  PLT 491* 558*   BMET:  Recent Labs    08/16/20 0054 08/17/20 0105  NA 130* 133*  K 4.5 4.2  CL 93* 96*  CO2 28 29  GLUCOSE 226* 213*  BUN 16 14  CREATININE 1.23 1.05  CALCIUM 8.0* 7.7*    PT/INR:  Recent Labs    08/14/20 1033  LABPROT 14.7  INR 1.2   ABG    Component Value Date/Time   PHART 7.351 08/15/2020 1545   HCO3 27.8 08/15/2020 1545   TCO2 31 08/15/2020 1446   O2SAT 88.2 08/15/2020 1545   CBG (last 3)  Recent Labs    08/16/20 1618 08/16/20 2123 08/17/20 0621  GLUCAP 276* 210* 187*    Meds Scheduled Meds: . acetaminophen  1,000 mg Oral Q6H   Or  . acetaminophen (TYLENOL) oral liquid 160 mg/5 mL  1,000 mg Oral Q6H  . amLODipine  5 mg Oral Daily  . bisacodyl  10 mg Oral Daily  . citalopram  20 mg Oral Daily  . cloNIDine  0.1 mg Oral QHS  . gabapentin  300 mg Oral TID   . guaiFENesin  1,200 mg Oral BID  . insulin aspart  0-15 Units Subcutaneous TID WC  . insulin aspart  0-5 Units Subcutaneous QHS  . insulin glargine  10 Units Subcutaneous Daily  . ketorolac  15 mg Intravenous Q6H  . magnesium oxide  400 mg Oral Daily  . nicotine  21 mg Transdermal Daily  . pantoprazole  40 mg Oral Daily  . rosuvastatin  5 mg Oral Daily  . senna  1 tablet Oral Daily   Continuous Infusions: . sodium chloride 50 mL/hr at 08/16/20 1045  . heparin 2,050 Units/hr (08/17/20 0238)  . lactated ringers Stopped (08/15/20 1524)  . piperacillin-tazobactam (ZOSYN)  IV 3.375 g (08/17/20 0445)   PRN Meds:.albuterol, benzonatate, dextrose, ondansetron (ZOFRAN) IV, oxyCODONE, traMADol  Xrays DG CHEST PORT 1 VIEW  Result Date: 08/16/2020 CLINICAL DATA:  Empyema. EXAM: PORTABLE CHEST 1 VIEW COMPARISON:  August 15, 2020. FINDINGS: Stable cardiomegaly. Left-sided chest tube is unchanged in position. No pneumothorax is noted. Stable residual left pleural effusion is noted with associated atelectasis or infiltrate. Right lung is unremarkable. Bony thorax is unremarkable. IMPRESSION: Left-sided chest tube is unchanged in position. Stable residual left pleural effusion  is noted with associated atelectasis or infiltrate. Electronically Signed   By: Lupita Raider M.D.   On: 08/16/2020 08:24   DG Chest Port 1 View  Result Date: 08/15/2020 CLINICAL DATA:  Post VATS EXAM: PORTABLE CHEST 1 VIEW COMPARISON:  08/10/2020 FINDINGS: Left chest tube in place. No pneumothorax. Left pleural effusion and left mid and lower lung airspace disease, similar to prior study. No confluent opacity on the right. Mild cardiomegaly. IMPRESSION: Left chest tube in place without pneumothorax. Continued left effusion with left lung atelectasis or infiltrates. Electronically Signed   By: Charlett Nose M.D.   On: 08/15/2020 16:30   VAS [place] ABI WITH/WO TBI  Result Date: 08/16/2020 LOWER EXTREMITY DOPPLER STUDY Indications:  Claudication, and peripheral artery disease. High Risk Factors: Hypertension, hyperlipidemia, Diabetes, current smoker.  Vascular Interventions: SFA Stent. Comparison Study: Previous 2/22 Normal Performing Technologist: Clint Guy RVT  Examination Guidelines: A complete evaluation includes at minimum, Doppler waveform signals and systolic blood pressure reading at the level of bilateral brachial, anterior tibial, and posterior tibial arteries, when vessel segments are accessible. Bilateral testing is considered an integral part of a complete examination. Photoelectric Plethysmograph (PPG) waveforms and toe systolic pressure readings are included as required and additional duplex testing as needed. Limited examinations for reoccurring indications may be performed as noted.  ABI Findings: +--------+------------------+-----+---------+--------+ Right   Rt Pressure (mmHg)IndexWaveform Comment  +--------+------------------+-----+---------+--------+ [id number]                    triphasic         +--------+------------------+-----+---------+--------+ PTA     130               1.23 triphasic         +--------+------------------+-----+---------+--------+ DP      121               1.14 triphasic         +--------+------------------+-----+---------+--------+ +--------+------------------+-----+---------+------------+ Left    Lt Pressure (mmHg)IndexWaveform Comment      +--------+------------------+-----+---------+------------+ Brachial                                Multiple IVs +--------+------------------+-----+---------+------------+ PTA     133               1.25 biphasic              +--------+------------------+-----+---------+------------+ DP      105               0.99 triphasic             +--------+------------------+-----+---------+------------+ +-------+-----------+-----------+------------+------------+ ABI/TBIToday's ABIToday's TBIPrevious ABIPrevious TBI  +-------+-----------+-----------+------------+------------+ Right  1.23                  1.08                     +-------+-----------+-----------+------------+------------+ Left   1.25                  1.11                     +-------+-----------+-----------+------------+------------+ Right ABIs appear increased. Left ABIs appear increased.  Summary: Right: Resting right ankle-brachial index is within normal range. No evidence of significant right lower extremity arterial disease. Left: Resting left ankle-brachial index is within normal range. No evidence of significant left lower extremity arterial disease.  *See table(s) above for measurements  and observations.  Electronically signed by Gretta Beganodd Early MD on 08/16/2020 at 7:21:21 PM.    Final    VAS US LOWER EXTREMITY VENOUS (DVT)  Result Date: 08/16/2020  Lower Venous DVT Study Indications: Pain.  Risk Factors: None identified. Comparison Study: No previous venous exams Performing Technologist: Clint GuyLisa Gibson RVT  Examination Guidelines: A complete evaluation includes B-mode imaging, spectral Doppler, color Doppler, and power Doppler as needed of all accessible portions of each vessel. Bilateral testing is considered an integral part of a complete examination. Limited examinations for reoccurring indications may be performed as noted. The reflux portion of the exam is performed with the patient in reverse Trendelenburg.  +---------+---------------+---------+-----------+----------+--------------+ LEFT     CompressibilityPhasicitySpontaneityPropertiesThrombus Aging +---------+---------------+---------+-----------+----------+--------------+ CFV      Full           Yes      Yes                                 +---------+---------------+---------+-----------+----------+--------------+ SFJ      Full                                                        +---------+---------------+---------+-----------+----------+--------------+ FV Prox   Full                                                        +---------+---------------+---------+-----------+----------+--------------+ FV Mid   Full                                                        +---------+---------------+---------+-----------+----------+--------------+ FV DistalFull                                                        +---------+---------------+---------+-----------+----------+--------------+ PFV      Full                                                        +---------+---------------+---------+-----------+----------+--------------+ POP      Full           Yes      Yes                                 +---------+---------------+---------+-----------+----------+--------------+ PTV      Full                                                        +---------+---------------+---------+-----------+----------+--------------+  PERO     Full                                                        +---------+---------------+---------+-----------+----------+--------------+ Soleal   None                                         Acute          +---------+---------------+---------+-----------+----------+--------------+   Summary: LEFT: - Findings consistent with acute deep vein thrombosis involving the left soleal veins. - No cystic structure found in the popliteal fossa.  *See table(s) above for measurements and observations. Electronically signed by Gretta Began MD on 08/16/2020 at 7:21:06 PM.    Final     Recent Results (from the past 240 hour(s))  Blood culture (single)     Status: None   Collection Time: 08/10/20  6:42 PM   Specimen: BLOOD  Result Value Ref Range Status   Specimen Description BLOOD RIGHT ANTECUBITAL  Final   Special Requests   Final    BOTTLES DRAWN AEROBIC AND ANAEROBIC Blood Culture adequate volume   Culture   Final    NO GROWTH 5 DAYS Performed at [facility], [address] [place], [place] [address]     Report Status 08/15/2020 FINAL  Final  Blood culture (single)     Status: None   Collection Time: 08/10/20  8:26 PM   Specimen: BLOOD  Result Value Ref Range Status   Specimen Description BLOOD BLOOD LEFT FOREARM  Final   Special Requests   Final    BOTTLES DRAWN AEROBIC AND ANAEROBIC Blood Culture results may not be optimal due to an excessive volume of blood received in culture bottles   Culture   Final    NO GROWTH 5 DAYS Performed at [facility], [address] [place], [place] [address]    Report Status 08/15/2020 FINAL  Final  Resp Panel by RT-PCR (Flu A&B, Covid) Nasopharyngeal Swab     Status: None   Collection Time: 08/10/20  8:26 PM   Specimen: Nasopharyngeal Swab; Nasopharyngeal(NP) swabs in vial transport medium  Result Value Ref Range Status   SARS Coronavirus 2 by RT PCR NEGATIVE NEGATIVE Final    Comment: (NOTE) SARS-CoV-2 target nucleic acids are NOT DETECTED.  The SARS-CoV-2 RNA is generally detectable in upper respiratory specimens during the acute phase of infection. The lowest concentration of SARS-CoV-2 viral copies this assay can detect is 138 copies/mL. A negative result does not preclude SARS-Cov-2 infection and should not be used as the sole basis for treatment or other patient management decisions. A negative result may occur with  improper specimen collection/handling, submission of specimen other than nasopharyngeal swab, presence of viral mutation(s) within the areas targeted by this assay, and inadequate number of viral copies(<138 copies/mL). A negative result must be combined with clinical observations, patient history, and epidemiological information. The expected result is Negative.  Fact Sheet for Patients:  BloggerCourse.com  Fact Sheet for Healthcare Providers:  SeriousBroker.it  This test is no t yet approved or cleared by the [place] FDA and  has been authorized for  detection and/or diagnosis of SARS-CoV-2 by FDA under an Emergency Use Authorization (EUA). This EUA will remain  in effect (meaning this test can be used) for the duration of the COVID-19 declaration under Section 564(b)(1) of the Act, 21 U.S.C.section 360bbb-3(b)(1), unless the authorization is terminated  or revoked sooner.       Influenza A by PCR NEGATIVE NEGATIVE Final   Influenza B by PCR NEGATIVE NEGATIVE Final    Comment: (NOTE) The Xpert Xpress SARS-CoV-2/FLU/RSV plus assay is intended as an aid in the diagnosis of influenza from Nasopharyngeal swab specimens and should not be used as a sole basis for treatment. Nasal washings and aspirates are unacceptable for Xpert Xpress SARS-CoV-2/FLU/RSV testing.  Fact Sheet for Patients: BloggerCourse.com  Fact Sheet for Healthcare Providers: SeriousBroker.it  This test is not yet approved or cleared by the [place] FDA and has been authorized for detection and/or diagnosis of SARS-CoV-2 by FDA under an Emergency Use Authorization (EUA). This EUA will remain in effect (meaning this test can be used) for the duration of the COVID-19 declaration under Section 564(b)(1) of the Act, 21 U.S.C. section 360bbb-3(b)(1), unless the authorization is terminated or revoked.  Performed at [facility], [address] [place], [place] [address]   Surgical PCR screen     Status: None   Collection Time: 08/14/20 11:50 AM   Specimen: Nasal Mucosa; Nasal Swab  Result Value Ref Range Status   MRSA, PCR NEGATIVE NEGATIVE Final   Staphylococcus aureus NEGATIVE NEGATIVE Final    Comment: (NOTE) The Xpert SA Assay (FDA approved for NASAL specimens in patients 8 years of age and older), is one component of a comprehensive surveillance program. It is not intended to diagnose infection nor to guide or monitor treatment. Performed at [facility], [address]  [place], [place] [address]   Body fluid culture w Gram Stain     Status: None (Preliminary result)   Collection Time: 08/15/20  1:48 PM   Specimen: Body Fluid  Result Value Ref Range Status   Specimen Description FLUID PLEURAL LEFT  Final   Special Requests NONE  Final   Gram Stain   Final    ABUNDANT WBC PRESENT,BOTH PMN AND MONONUCLEAR MODERATE GRAM POSITIVE COCCI    Culture   Final    NO GROWTH < 24 HOURS Performed at [facility], [address] [place], [place] [address]    Report Status PENDING  Incomplete    Assessment/Plan: S/P Procedure(s) (LRB): VIDEO ASSISTED THORACOSCOPY (VATS)/DECORTICATION (Left)  1 afeb, low relative SBP at times 80's-110's 2 sats good on 4 liters 3 surgical micro- NGSF 4 CT 145 cc/ 24 h- sero sang- keep for now, no air leak, poss H2O seal soon 5 no CXR ordered- will obtain 6 leukocytosis trend improved 7 H/H - lower, may be related to hemorrhagic component of effusion/empyema- now on heparin for DVT, plavix on hold- cont to monitor 8 normal renal fxn 9 BS fair control- per primary 10 pulm toilet/rehab as able   LOS: 6 days    Rowe Clack 08/17/2020   Agree with above Continue CT to suction pulm toilet  Townsend Cudworth O Jonaven Hilgers

## 2020-08-17 NOTE — Progress Notes (Signed)
ANTICOAGULATION CONSULT NOTE - Follow Up Consult  Pharmacy Consult for IV Heparin Indication: DVT  Allergies  Allergen Reactions  . Bee Venom Anaphylaxis  . Coffea Arabica Shortness Of Breath  . Coffee Flavor Shortness Of Breath  . Cheese Swelling    Blue Cheese only    Patient Measurements: Height: 5\' 10"  (177.8 cm) Weight: 93.3 kg (205 lb 11 oz) IBW/kg (Calculated) : 73 Heparin Dosing Weight: 93 kg  Vital Signs: Temp: 97.8 F (36.6 C) (03/01 2300) Temp Source: Oral (03/01 2300) BP: 99/66 (03/02 0137) Pulse Rate: 70 (03/02 0137)  Labs: Recent Labs    08/14/20 1033 08/15/20 0332 08/15/20 0332 08/15/20 1446 08/16/20 0054 08/16/20 1728 08/17/20 0105  HGB  --  10.9*   < > 10.2* 10.3*  --  9.5*  HCT  --  33.7*   < > 30.0* 33.2*  --  29.5*  PLT  --  476*  --   --  491*  --  558*  LABPROT 14.7  --   --   --   --   --   --   INR 1.2  --   --   --   --   --   --   HEPARINUNFRC  --   --   --   --   --  <0.10* <0.10*  CREATININE  --  0.84  --   --  1.23  --  1.05   < > = values in this interval not displayed.    Estimated Creatinine Clearance: 88 mL/min (by C-G formula based on SCr of 1.05 mg/dL).   Medical History: Past Medical History:  Diagnosis Date  . CHF (congestive heart failure) (HCC)   . Diabetes mellitus without complication (HCC)   . Hypercholesteremia   . Hypertension    Assessment: 60 yr old man admitted with empyema, S/P VATs 2/28, now found with acute L DVT. Pharmacy was consulted to dose IV heparin with no bolus given; pt was on no anticoagulant PTA. Chest tubes remain in place. H/H 10.3/33.2, plt 291 (CBC stable).  Heparin level ~6.5 hrs after starting heparin infusion at 1500 units/hr (no bolus) was <0.10 units/ml (undetectable). Per RN, no issues with IV or bleeding observed.  3/2 AM update:  Heparin level low No issues per RN  Goal of Therapy:  Heparin level 0.3-0.7 units/ml Monitor platelets by anticoagulation protocol: Yes   Plan:   Increase heparin infusion to 2050 units/hr Check heparin level in 6-8 hrs Monitor daily heparin level, CBC Monitor for bleeding  05-13-1999, PharmD, BCPS Clinical Pharmacist Phone: [phone]

## 2020-08-18 ENCOUNTER — Inpatient Hospital Stay (HOSPITAL_COMMUNITY): Payer: Medicaid Other

## 2020-08-18 LAB — COMPREHENSIVE METABOLIC PANEL
ALT: 11 U/L (ref 0–44)
AST: 12 U/L — ABNORMAL LOW (ref 15–41)
Albumin: 1.7 g/dL — ABNORMAL LOW (ref 3.5–5.0)
Alkaline Phosphatase: 143 U/L — ABNORMAL HIGH (ref 38–126)
Anion gap: 8 (ref 5–15)
BUN: 9 mg/dL (ref 6–20)
CO2: 28 mmol/L (ref 22–32)
Calcium: 7.9 mg/dL — ABNORMAL LOW (ref 8.9–10.3)
Chloride: 97 mmol/L — ABNORMAL LOW (ref 98–111)
Creatinine, Ser: 1.17 mg/dL (ref 0.61–1.24)
GFR, Estimated: >60 mL/min (ref >60)
Glucose, Bld: 197 mg/dL — ABNORMAL HIGH (ref 70–99)
Potassium: 4.3 mmol/L (ref 3.5–5.1)
Sodium: 133 mmol/L — ABNORMAL LOW (ref 135–145)
Total Bilirubin: 0.7 mg/dL (ref 0.3–1.2)
Total Protein: 5.8 g/dL — ABNORMAL LOW (ref 6.5–8.1)

## 2020-08-18 LAB — CBC
HCT: 28.2 % — ABNORMAL LOW (ref 39.0–52.0)
Hemoglobin: 9.2 g/dL — ABNORMAL LOW (ref 13.0–17.0)
MCH: 31.9 pg (ref 26.0–34.0)
MCHC: 32.6 g/dL (ref 30.0–36.0)
MCV: 97.9 fL (ref 80.0–100.0)
Platelets: 594 10*3/uL — ABNORMAL HIGH (ref 150–400)
RBC: 2.88 MIL/uL — ABNORMAL LOW (ref 4.22–5.81)
RDW: 13.4 % (ref 11.5–15.5)
WBC: 8.6 10*3/uL (ref 4.0–10.5)
nRBC: 0 % (ref 0.0–0.2)

## 2020-08-18 LAB — GLUCOSE, CAPILLARY
Glucose-Capillary: 164 mg/dL — ABNORMAL HIGH (ref 70–99)
Glucose-Capillary: 131 mg/dL — ABNORMAL HIGH (ref 70–99)
Glucose-Capillary: 114 mg/dL — ABNORMAL HIGH (ref 70–99)
Glucose-Capillary: 127 mg/dL — ABNORMAL HIGH (ref 70–99)

## 2020-08-18 LAB — HEPARIN LEVEL (UNFRACTIONATED)
Heparin Unfractionated: 0.19 IU/mL — ABNORMAL LOW (ref 0.30–0.70)
Heparin Unfractionated: 0.31 IU/mL (ref 0.30–0.70)
Heparin Unfractionated: 0.33 IU/mL (ref 0.30–0.70)

## 2020-08-18 NOTE — TOC Progression Note (Signed)
Transition of Care (TOC) - Progression Note    Patient Details  Name: Timothy [name]. MRN: [id number] Date of Birth: [birth year]  Transition of Care [facility]) CM/SW Contact  Leone Haven, RN Phone Number: 08/18/2020, 4:18 PM  Clinical Narrative:    NCM spoke with patient, he states he does not need any assistance with medications, [facility] [place] and he goes to the [employer] [facility]. He states he will have transport at discharge.  NCM informed him , that was trying to see if could assist him with any needs. He states no, he does not know when he is being discharged.         Expected Discharge Plan and Services                                                 Social Determinants of Health (SDOH) Interventions    Readmission Risk Interventions No flowsheet data found.

## 2020-08-18 NOTE — Progress Notes (Signed)
ANTICOAGULATION CONSULT NOTE - Follow Up Consult  Pharmacy Consult for IV Heparin Indication: DVT  Allergies  Allergen Reactions  . Bee Venom Anaphylaxis  . Coffea Arabica Shortness Of Breath  . Coffee Flavor Shortness Of Breath  . Cheese Swelling    Blue Cheese only    Patient Measurements: Height: 5\' 10"  (177.8 cm) Weight: 93.3 kg (205 lb 11 oz) IBW/kg (Calculated) : 73 Heparin Dosing Weight: 93 kg  Vital Signs: Temp: 98.9 F (37.2 C) (03/03 1608) Temp Source: Oral (03/03 1608) BP: 129/80 (03/03 1608) Pulse Rate: 67 (03/03 1608)  Labs: Recent Labs    08/16/20 0054 08/16/20 1728 08/17/20 0105 08/17/20 1029 08/18/20 0312 08/18/20 1012 08/18/20 1747  HGB 10.3*  --  9.5*  --  9.2*  --   --   HCT 33.2*  --  29.5*  --  28.2*  --   --   PLT 491*  --  558*  --  594*  --   --   HEPARINUNFRC  --    < > <0.10*   < > 0.19* 0.31 0.33  CREATININE 1.23  --  1.05  --  1.17  --   --    < > = values in this interval not displayed.    Estimated Creatinine Clearance: 78.9 mL/min (by C-G formula based on SCr of 1.17 mg/dL).   Medical History: Past Medical History:  Diagnosis Date  . CHF (congestive heart failure) (HCC)   . Diabetes mellitus without complication (HCC)   . Hypercholesteremia   . Hypertension    Assessment: 60 yr old man admitted with empyema, S/P VATs 2/28, now found with acute L DVT. Pharmacy was consulted to dose IV heparin with no bolus given; pt was on no anticoagulant PTA. Chest tubes remain in place.   Heparin level is therapeutic at 0.33, on 2950 units/hr. Hgb down slightly to 9.2, plt 594. No s/sx of bleeding or infusion issues per nursing.   Goal of Therapy:  Heparin level 0.3-0.7 units/ml Monitor platelets by anticoagulation protocol: Yes   Plan:  Increase heparin infusion to 3000 units/hr Check heparin level in 6 hrs Monitor daily heparin level, CBC Monitor for bleeding  3/28, PharmD., BCPS, BCCCP Clinical Pharmacist Please  refer to [facility] for unit-specific pharmacist

## 2020-08-18 NOTE — Progress Notes (Signed)
PROGRESS NOTE  Timothy [name]. YIF:[id number] DOB: 05/27/1961 DOA: 08/11/2020 PCP: Rolm Gala, NP  HPI/Recap of past 54 hours: 60 year old male with multiple medical problems-HTN, T2DM, chronic diastolic CHF, HX OF osteomyelitis status post amputation of left great toe 10/21, PTSD/antisocial personality disorder, GERD, HLD obesity transferred from [facility] with acute hypoxic respiratory failure due to left-sided empyema needing CT surgery consult at [facility].  Patient was being managed with IV Zosyn.  CT Sx was consulted at [facility]. 2.28- s/p  VATS/decoritication on left:  08/18/20: Seen and examined at his bedside.  Reports pain in his left thigh, examined, soft with no induration.  Chest x-ray completed, left sided chest tube is noted without pneumothorax.  Stable small loculated left pleural effusion is noted with associated left basilar atelectasis.  O2 saturation 94% on 3 L  Assessment/Plan: Principal Problem:   Empyema (HCC) Active Problems:   Essential hypertension   Type 2 diabetes mellitus with hyperglycemia (HCC)   Respiratory failure with hypoxia (HCC)   Hyponatremia   Chronic diastolic CHF (congestive heart failure) (HCC)   Anemia   Abdominal pain   Empyema lung (HCC)  Acute hypoxic respiratory failure 2/2 empyema Left-sided empyema   Sepsis POA secondary to empyema. Sepsis criteria has resolved. Afebrile with no leukocytosis. He is currently on IV Zosyn and probiotics. Chest tube in place, managed by CTS.  PVD hx of left toe amputation/ hx of stent-c/o left leg numbness/pain on walking prior to admission- checked vas art/abi-appears normal.  But found to have left soleal DVT-discussed with Dr. Cliffton Asters okay to start heparin without bolus.  Home plavix on hold. Currently on heparin drip for left lower extremity DVT.  Pharmacy assisting with dosing.  Appreciate assistance.  Left soleal DVT: heparin drip without bolus ( 2/2 chest tube), discussed with thoracic  surgery.  Essential hypertension/HLD:  BP is at goal,  Norvasc dose has been reduced to 5 mg daily  Continue clonidine 0.1 mg nightly  Continue to monitor vital signs. Continue home Crestor 5 mg daily  T2DM with uncontrolled hyperglycemia on presentation needing IV insulin infusion.  Patient transitioned to Lantus and sliding scale.  Hemoglobin A1c 7.7. Continue Lantus 10 units and sliding scale. Cont to hold home glipizide and Metformin monitor CBG.  Hyponatremia Serum sodium 133.  Chronic diastolic CHF:  Continue strict I's and O's and daily weight.  Net I&O +2.3 L.  Chronic Anemia-iron deficiency anemia Iron supplementation at discharge.  No overt bleeding Hemoglobin stable at 9.2 from 9.5.  Left-sided abdominal pain/perinephric STRANDING on CT abdomen RT> LT: no obvious pyelonephritis - patient is being treated with IV Zosyn, repeat imaging in 3 to 4 weeks.   Continue symptomatic management  PTSD/antisocial personality disorder: mood stable on Celexa.  GERD:Cont ppi  Overweight with BMI 29.5: Outpatient PCP follow-up for weight loss  Constipation:  Bowel regimen added   DVT prophylaxis:  Heparin drip.  Code Status:  Full Code  Family Communication: plan of care discussed with patient.    Status is: Inpatient    Dispo:  Patient From: Home  Planned Disposition: Home  Anticipated discharge date: August 23, 2020 or when CTS signs off.  Medically stable for discharge: No, ongoing management of left sided chest tube, empyema, left lower extremity DVT on heparin drip.          Objective: Vitals:   08/17/20 2315 08/18/20 0325 08/18/20 0751 08/18/20 1100  BP: 121/69 118/75 108/78 117/80  Pulse: 69 65 65 67  Resp: Temp: 98.5 F (36.9 C) 98.3 F (36.8 C) 98.3 F (36.8 C) 98.3 F (36.8 C)  TempSrc: Oral Oral Oral Oral  SpO2: 92% 91% 92%   Weight:      Height:        Intake/Output Summary (Last 24 hours) at 08/18/2020 1556 Last data  filed at 08/18/2020 0754 Gross per 24 hour  Intake 632.11 ml  Output 845 ml  Net -212.89 ml   Filed Weights   08/11/20 2120  Weight: 93.3 kg    Exam:  . General: 60 y.o. year-old male chronically ill-appearing in no acute distress.  He is alert and oriented x3.   . Cardiovascular: Regular rate and rhythm no rubs or gallops.   Marland Kitchen Respiratory: Mild rales at bases no wheezing noted.  Poor inspiratory effort.  Left sided chest tube in place.  .  Abdomen: Soft bowel sounds present.   . Musculoskeletal: Trace lower extremity edema bilaterally. . Skin: No ulcerative lesions noted.   Marland Kitchen Psychiatry: Mood is appropriate for condition and setting.   Data Reviewed: CBC: Recent Labs  Lab 08/13/20 0226 08/15/20 0332 08/15/20 1446 08/16/20 0054 08/17/20 0105 08/18/20 0312  WBC 9.9 11.6*  --  14.1* 11.9* 8.6  NEUTROABS 7.3  --   --   --   --   --   HGB 11.0* 10.9* 10.2* 10.3* 9.5* 9.2*  HCT 33.5* 33.7* 30.0* 33.2* 29.5* 28.2*  MCV 97.4 97.1  --  100.0 98.0 97.9  PLT 453* 476*  --  491* 558* 594*   Basic Metabolic Panel: Recent Labs  Lab 08/13/20 0226 08/15/20 0332 08/15/20 1446 08/16/20 0054 08/17/20 0105 08/18/20 0312  NA 133* 132* 132* 130* 133* 133*  K 4.0 4.3 4.2 4.5 4.2 4.3  CL 96* 94*  --  93* 96* 97*  CO2 28 29  --  GLUCOSE 154* 216*  --  226* 213* 197*  BUN 12 9  --  CREATININE 0.92 0.84  --  1.23 1.05 1.17  CALCIUM 8.2* 8.4*  --  8.0* 7.7* 7.9*   GFR: Estimated Creatinine Clearance: 78.9 mL/min (by C-G formula based on SCr of 1.17 mg/dL). Liver Function Tests: Recent Labs  Lab 08/13/20 0226 08/16/20 0054 08/17/20 0105 08/18/20 0312  AST 17 15 12* 12*  ALT ALKPHOS 102 90 115 143*  BILITOT 0.7 0.7 0.5 0.7  PROT 5.8* 5.9* 5.6* 5.8*  ALBUMIN 1.8* 1.8* 1.6* 1.7*   No results for input(s): LIPASE, AMYLASE in the last 168 hours. No results for input(s): AMMONIA in the last 168 hours. Coagulation Profile: Recent Labs  Lab  08/14/20 1033  INR 1.2   Cardiac Enzymes: No results for input(s): CKTOTAL, CKMB, CKMBINDEX, TROPONINI in the last 168 hours. BNP (last 3 results) No results for input(s): PROBNP in the last 8760 hours. HbA1C: No results for input(s): HGBA1C in the last 72 hours. CBG: Recent Labs  Lab 08/17/20 1112 08/17/20 1613 08/17/20 2122 08/18/20 0631 08/18/20 1105  GLUCAP 230* 162* 236* 164* 131*   Lipid Profile: No results for input(s): CHOL, HDL, LDLCALC, TRIG, CHOLHDL, LDLDIRECT in the last 72 hours. Thyroid Function Tests: No results for input(s): TSH, T4TOTAL, FREET4, T3FREE, THYROIDAB in the last 72 hours. Anemia Panel: No results for input(s): VITAMINB12, FOLATE, FERRITIN, TIBC, IRON, RETICCTPCT in the last 72 hours. Urine analysis:    Component Value Date/Time   COLORURINE YELLOW 08/12/2020  1330   APPEARANCEUR HAZY (A) 08/12/2020 1330   APPEARANCEUR Cloudy (A) 01/13/2020 1156   LABSPEC 1.024 08/12/2020 1330   PHURINE 5.0 08/12/2020 1330   GLUCOSEU NEGATIVE 08/12/2020 1330   HGBUR SMALL (A) 08/12/2020 1330   BILIRUBINUR NEGATIVE 08/12/2020 1330   BILIRUBINUR Negative 01/13/2020 1156   KETONESUR NEGATIVE 08/12/2020 1330   PROTEINUR NEGATIVE 08/12/2020 1330   NITRITE NEGATIVE 08/12/2020 1330   LEUKOCYTESUR NEGATIVE 08/12/2020 1330   Sepsis Labs: @LABRCNTIP (procalcitonin:4,lacticidven:4)  ) Recent Results (from the past 240 hour(s))  Blood culture (single)     Status: None   Collection Time: 08/10/20  6:42 PM   Specimen: BLOOD  Result Value Ref Range Status   Specimen Description BLOOD RIGHT ANTECUBITAL  Final   Special Requests   Final    BOTTLES DRAWN AEROBIC AND ANAEROBIC Blood Culture adequate volume   Culture   Final    NO GROWTH 5 DAYS Performed at [facility], [address] [place], [place] [address]    Report Status 08/15/2020 FINAL  Final  Blood culture (single)     Status: None   Collection Time: 08/10/20  8:26 PM   Specimen: BLOOD   Result Value Ref Range Status   Specimen Description BLOOD BLOOD LEFT FOREARM  Final   Special Requests   Final    BOTTLES DRAWN AEROBIC AND ANAEROBIC Blood Culture results may not be optimal due to an excessive volume of blood received in culture bottles   Culture   Final    NO GROWTH 5 DAYS Performed at [facility], [address] [place], [place] [address]    Report Status 08/15/2020 FINAL  Final  Resp Panel by RT-PCR (Flu A&B, Covid) Nasopharyngeal Swab     Status: None   Collection Time: 08/10/20  8:26 PM   Specimen: Nasopharyngeal Swab; Nasopharyngeal(NP) swabs in vial transport medium  Result Value Ref Range Status   SARS Coronavirus 2 by RT PCR NEGATIVE NEGATIVE Final    Comment: (NOTE) SARS-CoV-2 target nucleic acids are NOT DETECTED.  The SARS-CoV-2 RNA is generally detectable in upper respiratory specimens during the acute phase of infection. The lowest concentration of SARS-CoV-2 viral copies this assay can detect is 138 copies/mL. A negative result does not preclude SARS-Cov-2 infection and should not be used as the sole basis for treatment or other patient management decisions. A negative result may occur with  improper specimen collection/handling, submission of specimen other than nasopharyngeal swab, presence of viral mutation(s) within the areas targeted by this assay, and inadequate number of viral copies(<138 copies/mL). A negative result must be combined with clinical observations, patient history, and epidemiological information. The expected result is Negative.  Fact Sheet for Patients:  BloggerCourse.comhttps://www.fda.gov/media/152166/download  Fact Sheet for Healthcare Providers:  SeriousBroker.ithttps://www.fda.gov/media/152162/download  This test is no t yet approved or cleared by the [place] FDA and  has been authorized for detection and/or diagnosis of SARS-CoV-2 by FDA under an Emergency Use Authorization (EUA). This EUA will remain  in effect (meaning this  test can be used) for the duration of the COVID-19 declaration under Section 564(b)(1) of the Act, 21 U.S.C.section 360bbb-3(b)(1), unless the authorization is terminated  or revoked sooner.       Influenza A by PCR NEGATIVE NEGATIVE Final   Influenza B by PCR NEGATIVE NEGATIVE Final    Comment: (NOTE) The Xpert Xpress SARS-CoV-2/FLU/RSV plus assay is intended as an aid in the diagnosis of influenza from Nasopharyngeal swab specimens and should not be used as  a sole basis for treatment. Nasal washings and aspirates are unacceptable for Xpert Xpress SARS-CoV-2/FLU/RSV testing.  Fact Sheet for Patients: BloggerCourse.com  Fact Sheet for Healthcare Providers: SeriousBroker.it  This test is not yet approved or cleared by the [place] FDA and has been authorized for detection and/or diagnosis of SARS-CoV-2 by FDA under an Emergency Use Authorization (EUA). This EUA will remain in effect (meaning this test can be used) for the duration of the COVID-19 declaration under Section 564(b)(1) of the Act, 21 U.S.C. section 360bbb-3(b)(1), unless the authorization is terminated or revoked.  Performed at [facility], [address] [place], [place] [address]   Surgical PCR screen     Status: None   Collection Time: 08/14/20 11:50 AM   Specimen: Nasal Mucosa; Nasal Swab  Result Value Ref Range Status   MRSA, PCR NEGATIVE NEGATIVE Final   Staphylococcus aureus NEGATIVE NEGATIVE Final    Comment: (NOTE) The Xpert SA Assay (FDA approved for NASAL specimens in patients 54 years of age and older), is one component of a comprehensive surveillance program. It is not intended to diagnose infection nor to guide or monitor treatment. Performed at [facility], [address] [place], [place] [address]   Body fluid culture w Gram Stain     Status: None (Preliminary result)   Collection Time: 08/15/20  1:48 PM   Specimen:  Body Fluid  Result Value Ref Range Status   Specimen Description FLUID PLEURAL LEFT  Final   Special Requests NONE  Final   Gram Stain   Final    ABUNDANT WBC PRESENT,BOTH PMN AND MONONUCLEAR MODERATE GRAM POSITIVE COCCI    Culture   Final    CULTURE REINCUBATED FOR BETTER GROWTH Performed at [facility], [address], [place], [place] [address]    Report Status PENDING  Incomplete  Anaerobic culture w Gram Stain     Status: None (Preliminary result)   Collection Time: 08/15/20  1:48 PM   Specimen: Fluid  Result Value Ref Range Status   Specimen Description FLUID PLEURAL RIGHT  Final   Special Requests   Final    NONE Performed at [facility], [address], [place], [place] [address]    Culture   Final    NO ANAEROBES ISOLATED; CULTURE IN PROGRESS FOR 5 DAYS   Report Status PENDING  Incomplete      Studies: DG CHEST PORT 1 VIEW  Result Date: 08/18/2020 CLINICAL DATA:  Status post lung surgery. EXAM: PORTABLE CHEST 1 VIEW COMPARISON:  August 17, 2020. FINDINGS: Stable cardiomegaly. Left-sided chest tube is noted without pneumothorax. Stable small loculated left pleural effusion is noted with associated left basilar atelectasis. Right lung is unremarkable. Bony thorax is unremarkable. IMPRESSION: Left-sided chest tube is noted without pneumothorax. Stable small loculated left pleural effusion is noted with associated left basilar atelectasis. Electronically Signed   By: Lupita Raider M.D.   On: 08/18/2020 08:41    Scheduled Meds: . acetaminophen  1,000 mg Oral Q6H   Or  . acetaminophen (TYLENOL) oral liquid 160 mg/5 mL  1,000 mg Oral Q6H  . amLODipine  5 mg Oral Daily  . bisacodyl  10 mg Oral Daily  . citalopram  20 mg Oral Daily  . cloNIDine  0.1 mg Oral QHS  . gabapentin  300 mg Oral TID  . guaiFENesin  1,200 mg Oral BID  . insulin aspart  0-15 Units Subcutaneous TID WC  . insulin aspart  0-5 Units Subcutaneous QHS  . insulin glargine  10 Units  Subcutaneous Daily  . lidocaine  1 patch Transdermal Q24H  . magnesium oxide  400 mg Oral Daily  . nicotine  21 mg Transdermal Daily  . pantoprazole  40 mg Oral Daily  . polyethylene glycol  17 g Oral Daily  . rosuvastatin  5 mg Oral Daily  . saccharomyces boulardii  250 mg Oral BID  . senna  1 tablet Oral Daily    Continuous Infusions: . heparin 2,950 Units/hr (08/18/20 1146)  . lactated ringers Stopped (08/15/20 1524)  . piperacillin-tazobactam (ZOSYN)  IV 3.375 g (08/18/20 1533)     LOS: 7 days     Darlin Drop, MD [facility] Pager [phone]  If 7PM-7AM, please contact night-coverage [email or web address] Password [facility] 08/18/2020, 3:56 PM

## 2020-08-18 NOTE — Progress Notes (Addendum)
[address]       [place] [address]             [phone]      3 Days Post-Op Procedure(s) (LRB): VIDEO ASSISTED THORACOSCOPY (VATS)/DECORTICATION (Left) Subjective: Feels weak, also some peripheral neuropathy exacerbation in LE's  Objective: Vital signs in last 24 hours: Temp:  [97.9 F (36.6 C)-98.6 F (37 C)] 98.3 F (36.8 C) (03/03 0325) Pulse Rate:  [65-75] 65 (03/03 0325) Cardiac Rhythm: Normal sinus rhythm (03/03 0325) Resp:  [13-20] 13 (03/03 0325) BP: (101-121)/(66-75) 118/75 (03/03 0325) SpO2:  [90 %-100 %] 91 % (03/03 0325)  Hemodynamic parameters for last 24 hours:    Intake/Output from previous day: 03/02 0701 - 03/03 0700 In: 1846.1 [P.O.:960; I.V.:674.9; IV Piggyback:211.3] Out: 720 [Urine:650; Chest Tube:70] Intake/Output this shift: No intake/output data recorded.  General appearance: alert, cooperative and no distress Heart: regular rate and rhythm Lungs: dim left base, ronchi clears well with cough Abdomen: benign Extremities: left calf tenderness Wound: incis ok  Lab Results: Recent Labs    08/17/20 0105 08/18/20 0312  WBC 11.9* 8.6  HGB 9.5* 9.2*  HCT 29.5* 28.2*  PLT 558* 594*   BMET:  Recent Labs    08/17/20 0105 08/18/20 0312  NA 133* 133*  K 4.2 4.3  CL 96* 97*  CO2 29 28  GLUCOSE 213* 197*  BUN 14 9  CREATININE 1.05 1.17  CALCIUM 7.7* 7.9*    PT/INR: No results for input(s): LABPROT, INR in the last 72 hours. ABG    Component Value Date/Time   PHART 7.351 08/15/2020 1545   HCO3 27.8 08/15/2020 1545   TCO2 31 08/15/2020 1446   O2SAT 88.2 08/15/2020 1545   CBG (last 3)  Recent Labs    08/17/20 1613 08/17/20 2122 08/18/20 0631  GLUCAP 162* 236* 164*    Meds Scheduled Meds: . acetaminophen  1,000 mg Oral Q6H   Or  . acetaminophen (TYLENOL) oral liquid 160 mg/5 mL  1,000 mg Oral Q6H  . amLODipine  5 mg Oral Daily  . bisacodyl  10 mg Oral Daily  . citalopram  20 mg Oral Daily  .  cloNIDine  0.1 mg Oral QHS  . gabapentin  300 mg Oral TID  . guaiFENesin  1,200 mg Oral BID  . insulin aspart  0-15 Units Subcutaneous TID WC  . insulin aspart  0-5 Units Subcutaneous QHS  . insulin glargine  10 Units Subcutaneous Daily  . lidocaine  1 patch Transdermal Q24H  . magnesium oxide  400 mg Oral Daily  . nicotine  21 mg Transdermal Daily  . pantoprazole  40 mg Oral Daily  . polyethylene glycol  17 g Oral Daily  . rosuvastatin  5 mg Oral Daily  . saccharomyces boulardii  250 mg Oral BID  . senna  1 tablet Oral Daily   Continuous Infusions: . heparin 2,900 Units/hr (08/18/20 0418)  . lactated ringers Stopped (08/15/20 1524)  . piperacillin-tazobactam (ZOSYN)  IV 3.375 g (08/18/20 0504)   PRN Meds:.albuterol, benzonatate, bisacodyl, dextrose, ondansetron (ZOFRAN) IV, oxyCODONE, traMADol  Xrays DG CHEST PORT 1 VIEW  Result Date: 08/17/2020 CLINICAL DATA:  Chest tube, postop EXAM: PORTABLE CHEST 1 VIEW COMPARISON:  Portable exam 0808 hours compared to 08/16/2020 FINDINGS: LEFT thoracostomy tube unchanged. Enlargement of cardiac silhouette. Stable mediastinal contours and pulmonary vascularity. Persistent effusion at lower lateral LEFT hemithorax with atelectasis versus consolidation in lower LEFT lung. Remaining lungs clear. No pneumothorax.  IMPRESSION: Persistent atelectasis versus consolidation in lower LEFT lung with LEFT pleural effusion. Electronically Signed   By: Ulyses Southward M.D.   On: 08/17/2020 08:20   VAS [place] ABI WITH/WO TBI  Result Date: 08/16/2020 LOWER EXTREMITY DOPPLER STUDY Indications: Claudication, and peripheral artery disease. High Risk Factors: Hypertension, hyperlipidemia, Diabetes, current smoker.  Vascular Interventions: SFA Stent. Comparison Study: Previous 2/22 Normal Performing Technologist: Clint Guy RVT  Examination Guidelines: A complete evaluation includes at minimum, Doppler waveform signals and systolic blood pressure reading at the level of  bilateral brachial, anterior tibial, and posterior tibial arteries, when vessel segments are accessible. Bilateral testing is considered an integral part of a complete examination. Photoelectric Plethysmograph (PPG) waveforms and toe systolic pressure readings are included as required and additional duplex testing as needed. Limited examinations for reoccurring indications may be performed as noted.  ABI Findings: +--------+------------------+-----+---------+--------+ Right   Rt Pressure (mmHg)IndexWaveform Comment  +--------+------------------+-----+---------+--------+ [id number]                    triphasic         +--------+------------------+-----+---------+--------+ PTA     130               1.23 triphasic         +--------+------------------+-----+---------+--------+ DP      121               1.14 triphasic         +--------+------------------+-----+---------+--------+ +--------+------------------+-----+---------+------------+ Left    Lt Pressure (mmHg)IndexWaveform Comment      +--------+------------------+-----+---------+------------+ Brachial                                Multiple IVs +--------+------------------+-----+---------+------------+ PTA     133               1.25 biphasic              +--------+------------------+-----+---------+------------+ DP      105               0.99 triphasic             +--------+------------------+-----+---------+------------+ +-------+-----------+-----------+------------+------------+ ABI/TBIToday's ABIToday's TBIPrevious ABIPrevious TBI +-------+-----------+-----------+------------+------------+ Right  1.23                  1.08                     +-------+-----------+-----------+------------+------------+ Left   1.25                  1.11                     +-------+-----------+-----------+------------+------------+ Right ABIs appear increased. Left ABIs appear increased.  Summary: Right: Resting  right ankle-brachial index is within normal range. No evidence of significant right lower extremity arterial disease. Left: Resting left ankle-brachial index is within normal range. No evidence of significant left lower extremity arterial disease.  *See table(s) above for measurements and observations.  Electronically signed by Gretta Began MD on 08/16/2020 at 7:21:21 PM.    Final    VAS [place] LOWER EXTREMITY VENOUS (DVT)  Result Date: 08/16/2020  Lower Venous DVT Study Indications: Pain.  Risk Factors: None identified. Comparison Study: No previous venous exams Performing Technologist: Clint Guy RVT  Examination Guidelines: A complete evaluation includes B-mode imaging, spectral Doppler, color Doppler, and power Doppler as needed of all accessible portions of each vessel. Bilateral testing  is considered an integral part of a complete examination. Limited examinations for reoccurring indications may be performed as noted. The reflux portion of the exam is performed with the patient in reverse Trendelenburg.  +---------+---------------+---------+-----------+----------+--------------+ LEFT     CompressibilityPhasicitySpontaneityPropertiesThrombus Aging +---------+---------------+---------+-----------+----------+--------------+ CFV      Full           Yes      Yes                                 +---------+---------------+---------+-----------+----------+--------------+ SFJ      Full                                                        +---------+---------------+---------+-----------+----------+--------------+ FV Prox  Full                                                        +---------+---------------+---------+-----------+----------+--------------+ FV Mid   Full                                                        +---------+---------------+---------+-----------+----------+--------------+ FV DistalFull                                                         +---------+---------------+---------+-----------+----------+--------------+ PFV      Full                                                        +---------+---------------+---------+-----------+----------+--------------+ POP      Full           Yes      Yes                                 +---------+---------------+---------+-----------+----------+--------------+ PTV      Full                                                        +---------+---------------+---------+-----------+----------+--------------+ PERO     Full                                                        +---------+---------------+---------+-----------+----------+--------------+ Soleal   None  Acute          +---------+---------------+---------+-----------+----------+--------------+   Summary: LEFT: - Findings consistent with acute deep vein thrombosis involving the left soleal veins. - No cystic structure found in the popliteal fossa.  *See table(s) above for measurements and observations. Electronically signed by Gretta Beganodd Early MD on 08/16/2020 at 7:21:06 PM.    Final    Recent Results (from the past 240 hour(s))  Blood culture (single)     Status: None   Collection Time: 08/10/20  6:42 PM   Specimen: BLOOD  Result Value Ref Range Status   Specimen Description BLOOD RIGHT ANTECUBITAL  Final   Special Requests   Final    BOTTLES DRAWN AEROBIC AND ANAEROBIC Blood Culture adequate volume   Culture   Final    NO GROWTH 5 DAYS Performed at [facility], [address] [place], [place] [address]    Report Status 08/15/2020 FINAL  Final  Blood culture (single)     Status: None   Collection Time: 08/10/20  8:26 PM   Specimen: BLOOD  Result Value Ref Range Status   Specimen Description BLOOD BLOOD LEFT FOREARM  Final   Special Requests   Final    BOTTLES DRAWN AEROBIC AND ANAEROBIC Blood Culture results may not be optimal due to an excessive volume of  blood received in culture bottles   Culture   Final    NO GROWTH 5 DAYS Performed at [facility], [address] [place], [place] [address]    Report Status 08/15/2020 FINAL  Final  Resp Panel by RT-PCR (Flu A&B, Covid) Nasopharyngeal Swab     Status: None   Collection Time: 08/10/20  8:26 PM   Specimen: Nasopharyngeal Swab; Nasopharyngeal(NP) swabs in vial transport medium  Result Value Ref Range Status   SARS Coronavirus 2 by RT PCR NEGATIVE NEGATIVE Final    Comment: (NOTE) SARS-CoV-2 target nucleic acids are NOT DETECTED.  The SARS-CoV-2 RNA is generally detectable in upper respiratory specimens during the acute phase of infection. The lowest concentration of SARS-CoV-2 viral copies this assay can detect is 138 copies/mL. A negative result does not preclude SARS-Cov-2 infection and should not be used as the sole basis for treatment or other patient management decisions. A negative result may occur with  improper specimen collection/handling, submission of specimen other than nasopharyngeal swab, presence of viral mutation(s) within the areas targeted by this assay, and inadequate number of viral copies(<138 copies/mL). A negative result must be combined with clinical observations, patient history, and epidemiological information. The expected result is Negative.  Fact Sheet for Patients:  BloggerCourse.comhttps://www.fda.gov/media/152166/download  Fact Sheet for Healthcare Providers:  SeriousBroker.ithttps://www.fda.gov/media/152162/download  This test is no t yet approved or cleared by the [place] FDA and  has been authorized for detection and/or diagnosis of SARS-CoV-2 by FDA under an Emergency Use Authorization (EUA). This EUA will remain  in effect (meaning this test can be used) for the duration of the COVID-19 declaration under Section 564(b)(1) of the Act, 21 U.S.C.section 360bbb-3(b)(1), unless the authorization is terminated  or revoked sooner.       Influenza A by PCR  NEGATIVE NEGATIVE Final   Influenza B by PCR NEGATIVE NEGATIVE Final    Comment: (NOTE) The Xpert Xpress SARS-CoV-2/FLU/RSV plus assay is intended as an aid in the diagnosis of influenza from Nasopharyngeal swab specimens and should not be used as a sole basis for treatment. Nasal washings and aspirates are unacceptable for Xpert Xpress SARS-CoV-2/FLU/RSV testing.  Fact Sheet for  Patients: BloggerCourse.com  Fact Sheet for Healthcare Providers: SeriousBroker.it  This test is not yet approved or cleared by the [place] FDA and has been authorized for detection and/or diagnosis of SARS-CoV-2 by FDA under an Emergency Use Authorization (EUA). This EUA will remain in effect (meaning this test can be used) for the duration of the COVID-19 declaration under Section 564(b)(1) of the Act, 21 U.S.C. section 360bbb-3(b)(1), unless the authorization is terminated or revoked.  Performed at [facility], [address] [place], [place] [address]   Surgical PCR screen     Status: None   Collection Time: 08/14/20 11:50 AM   Specimen: Nasal Mucosa; Nasal Swab  Result Value Ref Range Status   MRSA, PCR NEGATIVE NEGATIVE Final   Staphylococcus aureus NEGATIVE NEGATIVE Final    Comment: (NOTE) The Xpert SA Assay (FDA approved for NASAL specimens in patients 77 years of age and older), is one component of a comprehensive surveillance program. It is not intended to diagnose infection nor to guide or monitor treatment. Performed at [facility], [address] [place], [place] [address]   Body fluid culture w Gram Stain     Status: None (Preliminary result)   Collection Time: 08/15/20  1:48 PM   Specimen: Body Fluid  Result Value Ref Range Status   Specimen Description FLUID PLEURAL LEFT  Final   Special Requests NONE  Final   Gram Stain   Final    ABUNDANT WBC PRESENT,BOTH PMN AND MONONUCLEAR MODERATE GRAM POSITIVE  COCCI    Culture   Final    CULTURE REINCUBATED FOR BETTER GROWTH Performed at [facility], [address] [place], [place] [address]    Report Status PENDING  Incomplete  Anaerobic culture w Gram Stain     Status: None (Preliminary result)   Collection Time: 08/15/20  1:48 PM   Specimen: Fluid  Result Value Ref Range Status   Specimen Description FLUID PLEURAL RIGHT  Final   Special Requests   Final    NONE Performed at [facility], [address] [place], [place] [address]    Culture   Final    NO ANAEROBES ISOLATED; CULTURE IN PROGRESS FOR 5 DAYS   Report Status PENDING  Incomplete   Assessment/Plan: S/P Procedure(s) (LRB): VIDEO ASSISTED THORACOSCOPY (VATS)/DECORTICATION (Left)   1 afeb, VSS, sinus rhythm 2 sats ok on 1 liter [place] 3 cultures from OR- [facility]. Mod GPC- conts zosyn 4 CXR pending- computer issue uploading to server , has been done 5 leukocytosis resolved 6 renal fxn normal, good UOP 7 CT drainage 70 cc- place to H2O seal 8 H/H down slightly , monitor closely on heparin for DVT 9 BS- some readings over 200- management per primary      LOS: 7 days    Rowe Clack PA-C Pager [phone] 08/18/2020   No events. Minimal chest tube output. Chest tube now on waterseal. Patient has not been up and ambulating.  This will be imperative prior to removing chest tube.  Harrell Keane Scrape

## 2020-08-18 NOTE — Progress Notes (Signed)
ANTICOAGULATION CONSULT NOTE - Follow Up Consult  Pharmacy Consult for IV Heparin Indication: DVT  Allergies  Allergen Reactions  . Bee Venom Anaphylaxis  . Coffea Arabica Shortness Of Breath  . Coffee Flavor Shortness Of Breath  . Cheese Swelling    Blue Cheese only    Patient Measurements: Height: 5\' 10"  (177.8 cm) Weight: 93.3 kg (205 lb 11 oz) IBW/kg (Calculated) : 73 Heparin Dosing Weight: 93 kg  Vital Signs: Temp: 98.3 F (36.8 C) (03/03 1100) Temp Source: Oral (03/03 1100) BP: 117/80 (03/03 1100) Pulse Rate: 67 (03/03 1100)  Labs: Recent Labs    08/16/20 0054 08/16/20 1728 08/17/20 0105 08/17/20 1029 08/17/20 2023 08/18/20 0312 08/18/20 1012  HGB 10.3*  --  9.5*  --   --  9.2*  --   HCT 33.2*  --  29.5*  --   --  28.2*  --   PLT 491*  --  558*  --   --  594*  --   HEPARINUNFRC  --    < > <0.10*   < > 0.10* 0.19* 0.31  CREATININE 1.23  --  1.05  --   --  1.17  --    < > = values in this interval not displayed.    Estimated Creatinine Clearance: 78.9 mL/min (by C-G formula based on SCr of 1.17 mg/dL).   Medical History: Past Medical History:  Diagnosis Date  . CHF (congestive heart failure) (HCC)   . Diabetes mellitus without complication (HCC)   . Hypercholesteremia   . Hypertension    Assessment: 60 yr old man admitted with empyema, S/P VATs 2/28, now found with acute L DVT. Pharmacy was consulted to dose IV heparin with no bolus given; pt was on no anticoagulant PTA. Chest tubes remain in place.   Heparin level is therapeutic at 0.31, on 2900 units/hr. Hgb down slightly to 9.2, plt 594. No s/sx of bleeding or infusion issues per nursing.   Goal of Therapy:  Heparin level 0.3-0.7 units/ml Monitor platelets by anticoagulation protocol: Yes   Plan:  Increase heparin infusion to 2950 units/hr Check heparin level in 6 hrs Monitor daily heparin level, CBC Monitor for bleeding  3/28, PharmD, BCCCP Clinical Pharmacist  Phone:  [phone] 08/18/2020 11:31 AM  Please check AMION for all [facility] phone numbers After 10:00 PM, call Main Pharmacy [phone]

## 2020-08-18 NOTE — Progress Notes (Signed)
ANTICOAGULATION CONSULT NOTE - Follow Up Consult  Pharmacy Consult for heparin Indication: DVT  Labs: Recent Labs    08/16/20 0054 08/16/20 1728 08/17/20 0105 08/17/20 1029 08/17/20 2023 08/18/20 0312  HGB 10.3*  --  9.5*  --   --  9.2*  HCT 33.2*  --  29.5*  --   --  28.2*  PLT 491*  --  558*  --   --  594*  HEPARINUNFRC  --    < > <0.10* 0.10* 0.10* 0.19*  CREATININE 1.23  --  1.05  --   --   --    < > = values in this interval not displayed.    Assessment: 60yo male remains subtherapeutic on heparin after rate change though getting closer to goal.  Goal of Therapy:  Heparin level 0.3-0.7 units/ml   Plan:  Will increase heparin gtt by 3 units/kg/hr to 2900 units/hr and check level in 6 hours.    Vernard Gambles, PharmD, BCPS  08/18/2020,4:09 AM

## 2020-08-19 ENCOUNTER — Inpatient Hospital Stay (HOSPITAL_COMMUNITY): Payer: Medicaid Other

## 2020-08-19 LAB — CBC
HCT: 29.6 % — ABNORMAL LOW (ref 39.0–52.0)
Hemoglobin: 9.2 g/dL — ABNORMAL LOW (ref 13.0–17.0)
MCH: 31 pg (ref 26.0–34.0)
MCHC: 31.1 g/dL (ref 30.0–36.0)
MCV: 99.7 fL (ref 80.0–100.0)
Platelets: 604 10*3/uL — ABNORMAL HIGH (ref 150–400)
RBC: 2.97 MIL/uL — ABNORMAL LOW (ref 4.22–5.81)
RDW: 13.7 % (ref 11.5–15.5)
WBC: 8.5 10*3/uL (ref 4.0–10.5)
nRBC: 0 % (ref 0.0–0.2)

## 2020-08-19 LAB — ACID FAST SMEAR (AFB, MYCOBACTERIA): Acid Fast Smear: NEGATIVE

## 2020-08-19 LAB — GLUCOSE, CAPILLARY
Glucose-Capillary: 132 mg/dL — ABNORMAL HIGH (ref 70–99)
Glucose-Capillary: 127 mg/dL — ABNORMAL HIGH (ref 70–99)
Glucose-Capillary: 138 mg/dL — ABNORMAL HIGH (ref 70–99)
Glucose-Capillary: 128 mg/dL — ABNORMAL HIGH (ref 70–99)

## 2020-08-19 LAB — BASIC METABOLIC PANEL
Anion gap: 10 (ref 5–15)
BUN: 6 mg/dL (ref 6–20)
CO2: 25 mmol/L (ref 22–32)
Calcium: 8.5 mg/dL — ABNORMAL LOW (ref 8.9–10.3)
Chloride: 97 mmol/L — ABNORMAL LOW (ref 98–111)
Creatinine, Ser: 0.89 mg/dL (ref 0.61–1.24)
GFR, Estimated: >60 mL/min (ref >60)
Glucose, Bld: 131 mg/dL — ABNORMAL HIGH (ref 70–99)
Potassium: 4.4 mmol/L (ref 3.5–5.1)
Sodium: 132 mmol/L — ABNORMAL LOW (ref 135–145)

## 2020-08-19 LAB — HEPARIN LEVEL (UNFRACTIONATED): Heparin Unfractionated: 0.36 IU/mL (ref 0.30–0.70)

## 2020-08-19 MED ORDER — MELATONIN 3 MG PO TABS
3.0000 mg | ORAL_TABLET | Freq: Every evening | ORAL | Status: DC | PRN
  Administered 2020-08-21 – 2020-08-23 (×3): 3 mg via ORAL
  Filled 2020-08-19 (×3): qty 1

## 2020-08-19 NOTE — Progress Notes (Signed)
Encouraged to have a walk along the hallway but refused due to pain. Explained the significance of walking but insistent not to.Will try to later again.

## 2020-08-19 NOTE — Progress Notes (Addendum)
PROGRESS NOTE  Timothy [name]. UVO:[id number] DOB: 28-Mar-1961 DOA: 08/11/2020 PCP: Rolm Gala, NP  HPI/Recap of past 15 hours: 60 year old male with multiple medical problems-HTN, T2DM, chronic diastolic CHF, HX OF osteomyelitis status post amputation of left great toe 10/21, PTSD/antisocial personality disorder, GERD, HLD obesity transferred from [facility] with acute hypoxic respiratory failure due to left-sided empyema needing CT surgery consult at [facility].  Patient was being managed with IV Zosyn.  CT Sx was consulted at [facility]. 2.28- s/p  VATS/decoritication on left:  08/19/20: Seen and examined at his bedside.  He reports difficulty staying asleep last night.  He has no other complaints.  Assessment/Plan: Principal Problem:   Empyema (HCC) Active Problems:   Essential hypertension   Type 2 diabetes mellitus with hyperglycemia (HCC)   Respiratory failure with hypoxia (HCC)   Hyponatremia   Chronic diastolic CHF (congestive heart failure) (HCC)   Anemia   Abdominal pain   Empyema lung (HCC)  Acute hypoxic respiratory failure 2/2 empyema Left-sided empyema   Sepsis POA secondary to empyema. Sepsis criteria has resolved. Afebrile with no leukocytosis. He is currently on IV Zosyn and probiotics. Body fluid culture grew moderate Streptococcus intermedius susceptibilities to follow.   Continue to follow cultures for sensitivities in order to narrow down antibiotics as indicated. Chest tube in place, managed by CTS.  PVD hx of left toe amputation/ hx of stent-c/o left leg numbness/pain on walking prior to admission- checked vas art/abi-appears normal.  But found to have left soleal DVT-discussed with Dr. Cliffton Asters okay to start heparin without bolus.  Home plavix on hold. Currently on heparin drip for left lower extremity DVT.  Pharmacy assisting with dosing.  Appreciate assistance.  Left soleal DVT: heparin drip without bolus ( 2/2 chest tube), previous provider discussed  with thoracic surgery.  Essential hypertension/HLD:  BP is currently at goal. Norvasc dose has been reduced to 5 mg daily  Continue clonidine 0.1 mg nightly  Continue to monitor vital signs. Continue home Crestor 5 mg daily  T2DM with uncontrolled hyperglycemia on presentation needing IV insulin infusion.  Patient transitioned to Lantus and sliding scale.  Hemoglobin A1c 7.7. Continue Lantus 10 units and sliding scale. Cont to hold home glipizide and Metformin monitor CBG.  Hyponatremia Serum sodium 133.  Chronic diastolic CHF:  Last 2D echo done on June 16, 2020 showed LVEF 60 to 65% Continue strict I's and O's and daily weight.  Net I&O -1.0 L  Chronic Anemia-iron deficiency anemia Iron supplementation at discharge.  No overt bleeding Hemoglobin stable at 9.2 from 9.5.  Left-sided abdominal pain/perinephric STRANDING on CT abdomen RT> LT: no obvious pyelonephritis - patient is being treated with IV Zosyn, repeat imaging in 3 to 4 weeks.   Continue symptomatic management  PTSD/antisocial personality disorder: mood stable on Celexa.  GERD:Cont ppi  Overweight with BMI 29.5: Outpatient PCP follow-up for weight loss  Constipation:  Bowel regimen added  Situational insomnia Start melatonin nightly as needed.   DVT prophylaxis:  Heparin drip.  Code Status:  Full Code  Family Communication: plan of care discussed with patient.    Status is: Inpatient    Dispo:  Patient From: Home  Planned Disposition: Home  Anticipated discharge date: August 23, 2020 or when CTS signs off.  Medically stable for discharge: No, ongoing management of left sided chest tube, empyema, left lower extremity DVT on heparin drip.          Objective: Vitals:   08/18/20 2028 08/19/20  2330 08/19/20 0746 08/19/20 1145  BP: 129/76 128/78 123/67 130/75  Pulse: 68 68 67 73  Resp: 16 13 19 18   Temp: 98.3 F (36.8 C) 98.1 F (36.7 C) 98.4 F (36.9 C) 98.3 F (36.8 C)   TempSrc: Oral Oral Oral Oral  SpO2: 97% 97% 98%   Weight:      Height:        Intake/Output Summary (Last 24 hours) at 08/19/2020 1504 Last data filed at 08/19/2020 1501 Gross per 24 hour  Intake 390 ml  Output 3825 ml  Net -3435 ml   Filed Weights   08/11/20 2120  Weight: 93.3 kg    Exam:  . General: 61 y.o. year-old male chronically ill-appearing no acute distress.  Alert oriented x3.   . Cardiovascular: Regular rate and rhythm no rubs or gallops.   41 Respiratory: Mild rales at bases no wheezing noted.  Poor inspiratory effort..  Left-sided chest tube in place.   .  Abdomen: Soft normal bowel sounds present. . Musculoskeletal: Trace lower extremity edema bilaterally . Skin: No ulcerative lesions noted. Marland Kitchen Psychiatry: Mood is appropriate for condition and setting.   Data Reviewed: CBC: Recent Labs  Lab 08/13/20 0226 08/15/20 0332 08/15/20 1446 08/16/20 0054 08/17/20 0105 08/18/20 0312 08/19/20 0145  WBC 9.9 11.6*  --  14.1* 11.9* 8.6 8.5  NEUTROABS 7.3  --   --   --   --   --   --   HGB 11.0* 10.9* 10.2* 10.3* 9.5* 9.2* 9.2*  HCT 33.5* 33.7* 30.0* 33.2* 29.5* 28.2* 29.6*  MCV 97.4 97.1  --  100.0 98.0 97.9 99.7  PLT 453* 476*  --  491* 558* 594* 604*   Basic Metabolic Panel: Recent Labs  Lab 08/15/20 0332 08/15/20 1446 08/16/20 0054 08/17/20 0105 08/18/20 0312 08/19/20 0145  NA 132* 132* 130* 133* 133* 132*  K 4.3 4.2 4.5 4.2 4.3 4.4  CL 94*  --  93* 96* 97* 97*  CO2 29  --  28 29 28 25   GLUCOSE 216*  --  226* 213* 197* 131*  BUN 9  --  16 14 9 6   CREATININE 0.84  --  1.23 1.05 1.17 0.89  CALCIUM 8.4*  --  8.0* 7.7* 7.9* 8.5*   GFR: Estimated Creatinine Clearance: 103.8 mL/min (by C-G formula based on SCr of 0.89 mg/dL). Liver Function Tests: Recent Labs  Lab 08/13/20 0226 08/16/20 0054 08/17/20 0105 08/18/20 0312  AST 17 15 12* 12*  ALT 17 9 11 11   ALKPHOS 102 90 115 143*  BILITOT 0.7 0.7 0.5 0.7  PROT 5.8* 5.9* 5.6* 5.8*  ALBUMIN 1.8*  1.8* 1.6* 1.7*   No results for input(s): LIPASE, AMYLASE in the last 168 hours. No results for input(s): AMMONIA in the last 168 hours. Coagulation Profile: Recent Labs  Lab 08/14/20 1033  INR 1.2   Cardiac Enzymes: No results for input(s): CKTOTAL, CKMB, CKMBINDEX, TROPONINI in the last 168 hours. BNP (last 3 results) No results for input(s): PROBNP in the last 8760 hours. HbA1C: No results for input(s): HGBA1C in the last 72 hours. CBG: Recent Labs  Lab 08/18/20 1105 08/18/20 1606 08/18/20 2135 08/19/20 0616 08/19/20 1144  GLUCAP 131* 114* 127* 132* 127*   Lipid Profile: No results for input(s): CHOL, HDL, LDLCALC, TRIG, CHOLHDL, LDLDIRECT in the last 72 hours. Thyroid Function Tests: No results for input(s): TSH, T4TOTAL, FREET4, T3FREE, THYROIDAB in the last 72 hours. Anemia Panel: No results for input(s): VITAMINB12, FOLATE, FERRITIN,  TIBC, IRON, RETICCTPCT in the last 72 hours. Urine analysis:    Component Value Date/Time   COLORURINE YELLOW 08/12/2020 1330   APPEARANCEUR HAZY (A) 08/12/2020 1330   APPEARANCEUR Cloudy (A) 01/13/2020 1156   LABSPEC 1.024 08/12/2020 1330   PHURINE 5.0 08/12/2020 1330   GLUCOSEU NEGATIVE 08/12/2020 1330   HGBUR SMALL (A) 08/12/2020 1330   BILIRUBINUR NEGATIVE 08/12/2020 1330   BILIRUBINUR Negative 01/13/2020 1156   KETONESUR NEGATIVE 08/12/2020 1330   PROTEINUR NEGATIVE 08/12/2020 1330   NITRITE NEGATIVE 08/12/2020 1330   LEUKOCYTESUR NEGATIVE 08/12/2020 1330   Sepsis Labs: @LABRCNTIP (procalcitonin:4,lacticidven:4)  ) Recent Results (from the past 240 hour(s))  Blood culture (single)     Status: None   Collection Time: 08/10/20  6:42 PM   Specimen: BLOOD  Result Value Ref Range Status   Specimen Description BLOOD RIGHT ANTECUBITAL  Final   Special Requests   Final    BOTTLES DRAWN AEROBIC AND ANAEROBIC Blood Culture adequate volume   Culture   Final    NO GROWTH 5 DAYS Performed at [facility], [address] [place], [place]    Report Status 08/15/2020 FINAL  Final  Blood culture (single)     Status: None   Collection Time: 08/10/20  8:26 PM   Specimen: BLOOD  Result Value Ref Range Status   Specimen Description BLOOD BLOOD LEFT FOREARM  Final   Special Requests   Final    BOTTLES DRAWN AEROBIC AND ANAEROBIC Blood Culture results may not be optimal due to an excessive volume of blood received in culture bottles   Culture   Final    NO GROWTH 5 DAYS Performed at [facility], [address] [place], [place]    Report Status 08/15/2020 FINAL  Final  Resp Panel by RT-PCR (Flu A&B, Covid) Nasopharyngeal Swab     Status: None   Collection Time: 08/10/20  8:26 PM   Specimen: Nasopharyngeal Swab; Nasopharyngeal(NP) swabs in vial transport medium  Result Value Ref Range Status   SARS Coronavirus 2 by RT PCR NEGATIVE NEGATIVE Final    Comment: (NOTE) SARS-CoV-2 target nucleic acids are NOT DETECTED.  The SARS-CoV-2 RNA is generally detectable in upper respiratory specimens during the acute phase of infection. The lowest concentration of SARS-CoV-2 viral copies this assay can detect is 138 copies/mL. A negative result does not preclude SARS-Cov-2 infection and should not be used as the sole basis for treatment or other patient management decisions. A negative result may occur with  improper specimen collection/handling, submission of specimen other than nasopharyngeal swab, presence of viral mutation(s) within the areas targeted by this assay, and inadequate number of viral copies(<138 copies/mL). A negative result must be combined with clinical observations, patient history, and epidemiological information. The expected result is Negative.  Fact Sheet for Patients:  08/12/20  Fact Sheet for Healthcare Providers:  BloggerCourse.com  This test is no t yet approved or cleared by the [place] FDA  and  has been authorized for detection and/or diagnosis of SARS-CoV-2 by FDA under an Emergency Use Authorization (EUA). This EUA will remain  in effect (meaning this test can be used) for the duration of the COVID-19 declaration under Section 564(b)(1) of the Act, 21 U.S.C.section 360bbb-3(b)(1), unless the authorization is terminated  or revoked sooner.       Influenza A by PCR NEGATIVE NEGATIVE Final   Influenza B by PCR NEGATIVE NEGATIVE Final    Comment: (NOTE) The Xpert Xpress SARS-CoV-2/FLU/RSV plus  assay is intended as an aid in the diagnosis of influenza from Nasopharyngeal swab specimens and should not be used as a sole basis for treatment. Nasal washings and aspirates are unacceptable for Xpert Xpress SARS-CoV-2/FLU/RSV testing.  Fact Sheet for Patients: BloggerCourse.comhttps://www.fda.gov/media/152166/download  Fact Sheet for Healthcare Providers: SeriousBroker.ithttps://www.fda.gov/media/152162/download  This test is not yet approved or cleared by the [place] FDA and has been authorized for detection and/or diagnosis of SARS-CoV-2 by FDA under an Emergency Use Authorization (EUA). This EUA will remain in effect (meaning this test can be used) for the duration of the COVID-19 declaration under Section 564(b)(1) of the Act, 21 U.S.C. section 360bbb-3(b)(1), unless the authorization is terminated or revoked.  Performed at [facility], [address] [name], [place] [address]   Surgical PCR screen     Status: None   Collection Time: 08/14/20 11:50 AM   Specimen: Nasal Mucosa; Nasal Swab  Result Value Ref Range Status   MRSA, PCR NEGATIVE NEGATIVE Final   Staphylococcus aureus NEGATIVE NEGATIVE Final    Comment: (NOTE) The Xpert SA Assay (FDA approved for NASAL specimens in patients 122 years of age and older), is one component of a comprehensive surveillance program. It is not intended to diagnose infection nor to guide or monitor treatment. Performed at [facility], [address] [place], [place] [address]   Body fluid culture w Gram Stain     Status: None (Preliminary result)   Collection Time: 08/15/20  1:48 PM   Specimen: Body Fluid  Result Value Ref Range Status   Specimen Description FLUID PLEURAL LEFT  Final   Special Requests NONE  Final   Gram Stain   Final    ABUNDANT WBC PRESENT,BOTH PMN AND MONONUCLEAR MODERATE GRAM POSITIVE COCCI    Culture   Final    MODERATE STREPTOCOCCUS INTERMEDIUS SUSCEPTIBILITIES TO FOLLOW Performed at [facility], [address] [place], [place] [address]    Report Status PENDING  Incomplete  Anaerobic culture w Gram Stain     Status: None (Preliminary result)   Collection Time: 08/15/20  1:48 PM   Specimen: Fluid  Result Value Ref Range Status   Specimen Description FLUID PLEURAL RIGHT  Final   Special Requests   Final    NONE Performed at [facility], [address] [place], [place] [address]    Culture   Final    NO ANAEROBES ISOLATED; CULTURE IN PROGRESS FOR 5 DAYS   Report Status PENDING  Incomplete      Studies: DG CHEST PORT 1 VIEW  Result Date: 08/19/2020 CLINICAL DATA:  Empyema EXAM: PORTABLE CHEST 1 VIEW COMPARISON:  08/18/2020 FINDINGS: Pulmonary insufflation is stable with mild left-sided volume loss. Large bore left chest tube is unchanged. Small lateral and basilar loculated left pleural effusion is again seen and appears slightly enlarged since prior examination. Superimposed infiltrate within the left mid and lower lung zone persists. Right lung is clear. No pneumothorax. No pleural effusion on the right. Cardiac size within normal limits. IMPRESSION: Slight interval increase in size in a loculated lateral and basilar left pleural effusion. Stable associated left basilar pulmonary infiltrate. Left chest tube in place.  No pneumothorax. Electronically Signed   By: Helyn NumbersAshesh  Parikh MD   On: 08/19/2020 07:03    Scheduled Meds: . acetaminophen  1,000 mg Oral Q6H   Or  .  acetaminophen (TYLENOL) oral liquid 160 mg/5 mL  1,000 mg Oral Q6H  . amLODipine  5  mg Oral Daily  . bisacodyl  10 mg Oral Daily  . citalopram  20 mg Oral Daily  . cloNIDine  0.1 mg Oral QHS  . gabapentin  300 mg Oral TID  . guaiFENesin  1,200 mg Oral BID  . insulin aspart  0-15 Units Subcutaneous TID WC  . insulin aspart  0-5 Units Subcutaneous QHS  . insulin glargine  10 Units Subcutaneous Daily  . lidocaine  1 patch Transdermal Q24H  . magnesium oxide  400 mg Oral Daily  . nicotine  21 mg Transdermal Daily  . pantoprazole  40 mg Oral Daily  . polyethylene glycol  17 g Oral Daily  . rosuvastatin  5 mg Oral Daily  . saccharomyces boulardii  250 mg Oral BID  . senna  1 tablet Oral Daily    Continuous Infusions: . heparin 3,000 Units/hr (08/19/20 1501)  . lactated ringers Stopped (08/15/20 1524)  . piperacillin-tazobactam (ZOSYN)  IV 3.375 g (08/19/20 1201)     LOS: 8 days     Darlin Drop, MD [facility] Pager [phone]  If 7PM-7AM, please contact night-coverage [email or web address] Password [facility] 08/19/2020, 3:04 PM

## 2020-08-19 NOTE — Progress Notes (Signed)
ANTICOAGULATION CONSULT NOTE - Follow Up Consult  Pharmacy Consult for IV Heparin Indication: DVT  Allergies  Allergen Reactions  . Bee Venom Anaphylaxis  . Coffea Arabica Shortness Of Breath  . Coffee Flavor Shortness Of Breath  . Cheese Swelling    Blue Cheese only    Patient Measurements: Height: 5\' 10"  (177.8 cm) Weight: 93.3 kg (205 lb 11 oz) IBW/kg (Calculated) : 73 Heparin Dosing Weight: 93 kg  Vital Signs: Temp: 98.4 F (36.9 C) (03/04 0746) Temp Source: Oral (03/04 0746) BP: 123/67 (03/04 0746) Pulse Rate: 67 (03/04 0746)  Labs: Recent Labs    08/17/20 0105 08/17/20 1029 08/18/20 0312 08/18/20 1012 08/18/20 1747 08/19/20 0145  HGB 9.5*  --  9.2*  --   --  9.2*  HCT 29.5*  --  28.2*  --   --  29.6*  PLT 558*  --  594*  --   --  604*  HEPARINUNFRC <0.10*   < > 0.19* 0.31 0.33 0.36  CREATININE 1.05  --  1.17  --   --  0.89   < > = values in this interval not displayed.    Estimated Creatinine Clearance: 103.8 mL/min (by C-G formula based on SCr of 0.89 mg/dL).   Medical History: Past Medical History:  Diagnosis Date  . CHF (congestive heart failure) (HCC)   . Diabetes mellitus without complication (HCC)   . Hypercholesteremia   . Hypertension    Assessment: 60 yr old man admitted with empyema, S/P VATs 2/28, now found with acute L DVT. Pharmacy was consulted to dose IV heparin with no bolus; pt was on no anticoagulant PTA. Chest tubes remain in place.   Heparin level therapeutic on 0.36, CBC is low but stable, minimal CT output.   Goal of Therapy:  Heparin level 0.3-0.7 units/ml Monitor platelets by anticoagulation protocol: Yes   Plan:  Continue heparin infusion at 3000 units/hr Daily heparin level and CBC F/U plans for oral [facility]   [facility], PharmD, BCPS, [facility] Clinical Pharmacist [phone] Please check AMION for all [facility]acy numbers 08/19/2020

## 2020-08-19 NOTE — Progress Notes (Addendum)
[address]       [name] [address]             [phone]      4 Days Post-Op Procedure(s) (LRB): VIDEO ASSISTED THORACOSCOPY (VATS)/DECORTICATION (Left) Subjective: Feels better, cough lwss productive  Objective: Vital signs in last 24 hours: Temp:  [98.1 F (36.7 C)-98.9 F (37.2 C)] 98.1 F (36.7 C) (03/04 0322) Pulse Rate:  [65-68] 68 (03/04 0322) Cardiac Rhythm: Normal sinus rhythm (03/03 1916) Resp:  [12-16] 13 (03/04 0322) BP: (108-129)/(76-80) 128/78 (03/04 0322) SpO2:  [92 %-97 %] 97 % (03/04 0322)  Hemodynamic parameters for last 24 hours:    Intake/Output from previous day: 03/03 0701 - 03/04 0700 In: 360 [P.O.:360] Out: 3350 [Urine:3325; Chest Tube:25] Intake/Output this shift: No intake/output data recorded.  General appearance: alert, cooperative and no distress Heart: regular rate and rhythm Lungs: dim left lower fields Abdomen: benign Extremities: no change  Wound: healing well  Lab Results: Recent Labs    08/18/20 0312 08/19/20 0145  WBC 8.6 8.5  HGB 9.2* 9.2*  HCT 28.2* 29.6*  PLT 594* 604*   BMET:  Recent Labs    08/18/20 0312 08/19/20 0145  NA 133* 132*  K 4.3 4.4  CL 97* 97*  CO2 28 25  GLUCOSE 197* 131*  BUN 9 6  CREATININE 1.17 0.89  CALCIUM 7.9* 8.5*    PT/INR: No results for input(s): LABPROT, INR in the last 72 hours. ABG    Component Value Date/Time   PHART 7.351 08/15/2020 1545   HCO3 27.8 08/15/2020 1545   TCO2 31 08/15/2020 1446   O2SAT 88.2 08/15/2020 1545   CBG (last 3)  Recent Labs    08/18/20 1606 08/18/20 2135 08/19/20 0616  GLUCAP 114* 127* 132*    Meds Scheduled Meds: . acetaminophen  1,000 mg Oral Q6H   Or  . acetaminophen (TYLENOL) oral liquid 160 mg/5 mL  1,000 mg Oral Q6H  . amLODipine  5 mg Oral Daily  . bisacodyl  10 mg Oral Daily  . citalopram  20 mg Oral Daily  . cloNIDine  0.1 mg Oral QHS  . gabapentin  300 mg Oral TID  . guaiFENesin  1,200 mg Oral BID   . insulin aspart  0-15 Units Subcutaneous TID WC  . insulin aspart  0-5 Units Subcutaneous QHS  . insulin glargine  10 Units Subcutaneous Daily  . lidocaine  1 patch Transdermal Q24H  . magnesium oxide  400 mg Oral Daily  . nicotine  21 mg Transdermal Daily  . pantoprazole  40 mg Oral Daily  . polyethylene glycol  17 g Oral Daily  . rosuvastatin  5 mg Oral Daily  . saccharomyces boulardii  250 mg Oral BID  . senna  1 tablet Oral Daily   Continuous Infusions: . heparin 3,000 Units/hr (08/18/20 1949)  . lactated ringers Stopped (08/15/20 1524)  . piperacillin-tazobactam (ZOSYN)  IV 3.375 g (08/19/20 0502)   PRN Meds:.albuterol, benzonatate, bisacodyl, dextrose, ondansetron (ZOFRAN) IV, oxyCODONE, traMADol  Xrays DG CHEST PORT 1 VIEW  Result Date: 08/19/2020 CLINICAL DATA:  Empyema EXAM: PORTABLE CHEST 1 VIEW COMPARISON:  08/18/2020 FINDINGS: Pulmonary insufflation is stable with mild left-sided volume loss. Large bore left chest tube is unchanged. Small lateral and basilar loculated left pleural effusion is again seen and appears slightly enlarged since prior examination. Superimposed infiltrate within the left mid and lower lung zone persists. Right lung is clear. No pneumothorax. No pleural  effusion on the right. Cardiac size within normal limits. IMPRESSION: Slight interval increase in size in a loculated lateral and basilar left pleural effusion. Stable associated left basilar pulmonary infiltrate. Left chest tube in place.  No pneumothorax. Electronically Signed   By: Helyn Numbers MD   On: 08/19/2020 07:03   DG CHEST PORT 1 VIEW  Result Date: 08/18/2020 CLINICAL DATA:  Status post lung surgery. EXAM: PORTABLE CHEST 1 VIEW COMPARISON:  August 17, 2020. FINDINGS: Stable cardiomegaly. Left-sided chest tube is noted without pneumothorax. Stable small loculated left pleural effusion is noted with associated left basilar atelectasis. Right lung is unremarkable. Bony thorax is unremarkable.  IMPRESSION: Left-sided chest tube is noted without pneumothorax. Stable small loculated left pleural effusion is noted with associated left basilar atelectasis. Electronically Signed   By: Lupita Raider M.D.   On: 08/18/2020 08:41   DG CHEST PORT 1 VIEW  Result Date: 08/17/2020 CLINICAL DATA:  Chest tube, postop EXAM: PORTABLE CHEST 1 VIEW COMPARISON:  Portable exam 0808 hours compared to 08/16/2020 FINDINGS: LEFT thoracostomy tube unchanged. Enlargement of cardiac silhouette. Stable mediastinal contours and pulmonary vascularity. Persistent effusion at lower lateral LEFT hemithorax with atelectasis versus consolidation in lower LEFT lung. Remaining lungs clear. No pneumothorax. IMPRESSION: Persistent atelectasis versus consolidation in lower LEFT lung with LEFT pleural effusion. Electronically Signed   By: Ulyses Southward M.D.   On: 08/17/2020 08:20    Results for orders placed or performed during the hospital encounter of 08/11/20  Surgical PCR screen     Status: None   Collection Time: 08/14/20 11:50 AM   Specimen: Nasal Mucosa; Nasal Swab  Result Value Ref Range Status   MRSA, PCR NEGATIVE NEGATIVE Final   Staphylococcus aureus NEGATIVE NEGATIVE Final    Comment: (NOTE) The Xpert SA Assay (FDA approved for NASAL specimens in patients 68 years of age and older), is one component of a comprehensive surveillance program. It is not intended to diagnose infection nor to guide or monitor treatment. Performed at [facility], [address], [place], [place] [address]   Body fluid culture w Gram Stain     Status: None (Preliminary result)   Collection Time: 08/15/20  1:48 PM   Specimen: Body Fluid  Result Value Ref Range Status   Specimen Description FLUID PLEURAL LEFT  Final   Special Requests NONE  Final   Gram Stain   Final    ABUNDANT WBC PRESENT,BOTH PMN AND MONONUCLEAR MODERATE GRAM POSITIVE COCCI    Culture   Final    CULTURE REINCUBATED FOR BETTER GROWTH Performed at [facility], [address] [place], [place] [address]    Report Status PENDING  Incomplete  Anaerobic culture w Gram Stain     Status: None (Preliminary result)   Collection Time: 08/15/20  1:48 PM   Specimen: Fluid  Result Value Ref Range Status   Specimen Description FLUID PLEURAL RIGHT  Final   Special Requests   Final    NONE Performed at [facility], [address], [place], [place] [address]    Culture   Final    NO ANAEROBES ISOLATED; CULTURE IN PROGRESS FOR 5 DAYS   Report Status PENDING  Incomplete    Assessment/Plan: S/P Procedure(s) (LRB): VIDEO ASSISTED THORACOSCOPY (VATS)/DECORTICATION (Left)  1 afeb, VSS 2 sats ok on 3 liters 3 CXR slightly increased effusion 4 no leukocytosis, conts zosyn 5 H/H stable 6 Normal renal fxn, excellent UOP 7 sugars better controlled  8 CT , only 25 cc- serosang , was placed back on 20 of suction by nursing d/t increased pain- will place back on water seal, poss remove soon but some concern with lack of mobilization and CXR appearance 9 mobilize and cont pulm toilet 10 remains on heparin gtt for DVT, ? Convert to eliquis soon  LOS: 8 days    Rowe Clack PA-C Pager [phone] 08/19/2020   Agree with the plan. Patient has not ambulated much if at all since surgery. Chest tube is now to waterseal aggressive ambulation and pulmonary toilet. Chest x-ray shows worsening effusion. Will consider obtaining a CT scan prior to removal of the chest to  [employer]

## 2020-08-19 NOTE — Progress Notes (Signed)
Chest tube was on suction -20 on shift change claiming to have pain on site on water seal, PA and TCTS made aware and wanted to be on water seal. Explained to the pt. Also encouraged to ambulate  and make use of the IS.continue to monitor.

## 2020-08-20 ENCOUNTER — Inpatient Hospital Stay (HOSPITAL_COMMUNITY): Payer: Medicaid Other

## 2020-08-20 LAB — GLUCOSE, CAPILLARY
Glucose-Capillary: 138 mg/dL — ABNORMAL HIGH (ref 70–99)
Glucose-Capillary: 150 mg/dL — ABNORMAL HIGH (ref 70–99)
Glucose-Capillary: 128 mg/dL — ABNORMAL HIGH (ref 70–99)
Glucose-Capillary: 175 mg/dL — ABNORMAL HIGH (ref 70–99)

## 2020-08-20 LAB — BODY FLUID CULTURE W GRAM STAIN

## 2020-08-20 LAB — CBC
HCT: 33.1 % — ABNORMAL LOW (ref 39.0–52.0)
Hemoglobin: 10.9 g/dL — ABNORMAL LOW (ref 13.0–17.0)
MCH: 31.5 pg (ref 26.0–34.0)
MCHC: 32.9 g/dL (ref 30.0–36.0)
MCV: 95.7 fL (ref 80.0–100.0)
Platelets: 668 10*3/uL — ABNORMAL HIGH (ref 150–400)
RBC: 3.46 MIL/uL — ABNORMAL LOW (ref 4.22–5.81)
RDW: 13.5 % (ref 11.5–15.5)
WBC: 8 10*3/uL (ref 4.0–10.5)
nRBC: 0 % (ref 0.0–0.2)

## 2020-08-20 LAB — ANAEROBIC CULTURE W GRAM STAIN

## 2020-08-20 LAB — HEPARIN LEVEL (UNFRACTIONATED): Heparin Unfractionated: 0.34 IU/mL (ref 0.30–0.70)

## 2020-08-20 MED ORDER — SODIUM CHLORIDE 0.9 % IV SOLN
2.0000 g | INTRAVENOUS | Status: DC
  Administered 2020-08-20 – 2020-08-22 (×3): 2 g via INTRAVENOUS
  Filled 2020-08-20 (×4): qty 20

## 2020-08-20 MED ORDER — METFORMIN HCL 500 MG PO TABS
1000.0000 mg | ORAL_TABLET | Freq: Two times a day (BID) | ORAL | Status: DC
Start: 2020-08-20 — End: 2020-08-24
  Administered 2020-08-20 – 2020-08-24 (×8): 1000 mg via ORAL
  Filled 2020-08-20 (×8): qty 2

## 2020-08-20 NOTE — Progress Notes (Signed)
[address]       [place] [address]             [phone]      5 Days Post-Op Procedure(s) (LRB): VIDEO ASSISTED THORACOSCOPY (VATS)/DECORTICATION (Left) Subjective: Says he feels he is getting better each day. Less cough. On RA with adequate O2 sats.   Objective: Vital signs in last 24 hours: Temp:  [97.9 F (36.6 C)-98.3 F (36.8 C)] 97.9 F (36.6 C) (03/05 0733) Pulse Rate:  [66-94] 66 (03/05 0733) Cardiac Rhythm: Normal sinus rhythm (03/05 0741) Resp:  [11-19] 18 (03/05 0733) BP: (104-140)/(74-82) 130/74 (03/05 0733) SpO2:  [93 %-99 %] 93 % (03/05 0733)     Intake/Output from previous day: 03/04 0701 - 03/05 0700 In: 848.9 [P.O.:229; I.V.:569.9; IV Piggyback:50] Out: 3955 [Urine:3875; Chest Tube:80] Intake/Output this shift: Total I/O In: 210 [P.O.:120; I.V.:90] Out: 400 [Urine:400]  General appearance: alert, cooperative and no distress Heart: regular rate and rhythm Lungs: dim left lower fields, serous CT drainage (19ml past 24 hours). No air leak.  Abdomen: benign Wound: healing well  Lab Results: Recent Labs    08/19/20 0145 08/20/20 0502  WBC 8.5 8.0  HGB 9.2* 10.9*  HCT 29.6* 33.1*  PLT 604* 668*   BMET:  Recent Labs    08/18/20 0312 08/19/20 0145  NA 133* 132*  K 4.3 4.4  CL 97* 97*  CO2 28 25  GLUCOSE 197* 131*  BUN 9 6  CREATININE 1.17 0.89  CALCIUM 7.9* 8.5*    PT/INR: No results for input(s): LABPROT, INR in the last 72 hours. ABG    Component Value Date/Time   PHART 7.351 08/15/2020 1545   HCO3 27.8 08/15/2020 1545   TCO2 31 08/15/2020 1446   O2SAT 88.2 08/15/2020 1545   CBG (last 3)  Recent Labs    08/19/20 1658 08/19/20 2115 08/20/20 0606  GLUCAP 138* 128* 138*    Meds Scheduled Meds: . acetaminophen  1,000 mg Oral Q6H   Or  . acetaminophen (TYLENOL) oral liquid 160 mg/5 mL  1,000 mg Oral Q6H  . amLODipine  5 mg Oral Daily  . bisacodyl  10 mg Oral Daily  . citalopram  20 mg Oral  Daily  . cloNIDine  0.1 mg Oral QHS  . gabapentin  300 mg Oral TID  . guaiFENesin  1,200 mg Oral BID  . insulin aspart  0-15 Units Subcutaneous TID WC  . insulin aspart  0-5 Units Subcutaneous QHS  . insulin glargine  10 Units Subcutaneous Daily  . magnesium oxide  400 mg Oral Daily  . nicotine  21 mg Transdermal Daily  . pantoprazole  40 mg Oral Daily  . polyethylene glycol  17 g Oral Daily  . rosuvastatin  5 mg Oral Daily  . saccharomyces boulardii  250 mg Oral BID  . senna  1 tablet Oral Daily   Continuous Infusions: . heparin 3,000 Units/hr (08/20/20 0903)  . lactated ringers Stopped (08/15/20 1524)  . piperacillin-tazobactam (ZOSYN)  IV 3.375 g (08/20/20 0553)   PRN Meds:.albuterol, benzonatate, bisacodyl, dextrose, melatonin, ondansetron (ZOFRAN) IV, oxyCODONE, traMADol  Xrays DG Chest 2 View  Result Date: 08/20/2020 CLINICAL DATA:  Follow-up surgery EXAM: CHEST - 2 VIEW COMPARISON:  August 19, 2020 FINDINGS: A left chest tube terminates in the left apex, unchanged. No pneumothorax identified. Stable left effusion and underlying opacity. The cardiomediastinal silhouette is stable. The right lung remains clear. No other acute interval changes. IMPRESSION: 1.  Stable left chest tube without pneumothorax. 2. Stable effusion and opacity in the left mid lower lung. Electronically Signed   By: Gerome Sam III M.D   On: 08/20/2020 08:16   DG CHEST PORT 1 VIEW  Result Date: 08/19/2020 CLINICAL DATA:  Empyema EXAM: PORTABLE CHEST 1 VIEW COMPARISON:  08/18/2020 FINDINGS: Pulmonary insufflation is stable with mild left-sided volume loss. Large bore left chest tube is unchanged. Small lateral and basilar loculated left pleural effusion is again seen and appears slightly enlarged since prior examination. Superimposed infiltrate within the left mid and lower lung zone persists. Right lung is clear. No pneumothorax. No pleural effusion on the right. Cardiac size within normal limits. IMPRESSION:  Slight interval increase in size in a loculated lateral and basilar left pleural effusion. Stable associated left basilar pulmonary infiltrate. Left chest tube in place.  No pneumothorax. Electronically Signed   By: Helyn Numbers MD   On: 08/19/2020 07:03    Results for orders placed or performed during the hospital encounter of 08/11/20  Surgical PCR screen     Status: None   Collection Time: 08/14/20 11:50 AM   Specimen: Nasal Mucosa; Nasal Swab  Result Value Ref Range Status   MRSA, PCR NEGATIVE NEGATIVE Final   Staphylococcus aureus NEGATIVE NEGATIVE Final    Comment: (NOTE) The Xpert SA Assay (FDA approved for NASAL specimens in patients 34 years of age and older), is one component of a comprehensive surveillance program. It is not intended to diagnose infection nor to guide or monitor treatment. Performed at [facility], [address], [place], [place] [address]   Acid Fast Smear (AFB)     Status: None   Collection Time: 08/15/20  1:48 PM   Specimen: Other Source; Body Fluid  Result Value Ref Range Status   AFB Specimen Processing Concentration  Final   Acid Fast Smear Negative  Final    Comment: (NOTE) Performed At: [facility] [address] [place], [place] [id number] Jolene Schimke MD DJ:[id number]    Source (AFB) FLUID  Final    Comment: PLEURAL RIGHT Performed at [facility], [address], [place], [place] [address]   Body fluid culture w Gram Stain     Status: None (Preliminary result)   Collection Time: 08/15/20  1:48 PM   Specimen: Body Fluid  Result Value Ref Range Status   Specimen Description FLUID PLEURAL LEFT  Final   Special Requests NONE  Final   Gram Stain   Final    ABUNDANT WBC PRESENT,BOTH PMN AND MONONUCLEAR MODERATE GRAM POSITIVE COCCI    Culture   Final    MODERATE STREPTOCOCCUS INTERMEDIUS SUSCEPTIBILITIES TO FOLLOW Performed at [facility], [address], [place], [place] [address]    Report Status  PENDING  Incomplete  Anaerobic culture w Gram Stain     Status: None (Preliminary result)   Collection Time: 08/15/20  1:48 PM   Specimen: Fluid  Result Value Ref Range Status   Specimen Description FLUID PLEURAL RIGHT  Final   Special Requests   Final    NONE Performed at [facility], [address] [place], [place] [address]    Culture   Final    NO ANAEROBES ISOLATED; CULTURE IN PROGRESS FOR 5 DAYS   Report Status PENDING  Incomplete    Assessment/Plan: S/P Procedure(s) (LRB): VIDEO ASSISTED THORACOSCOPY (VATS)/DECORTICATION (Left)  POD-5 VATS / Decort. 1 afeb, VSS 2 sats ok on RA 3 CXR  stable, small left effusion and lower lobe density.  CT chest tomorrow to assure abscess adequately drained before removing the chest tube.  4 no leukocytosis, has continuous zosyn infusion 5 H/H stable 6 Normal renal fxn, excellent UOP 7 Glucose controlled 8. Remains on heparin gtt for DVT, ? Convert to eliquis soon. Defer to primary medicine team.    LOS: 9 days    Leary Roca PA-C Pager [phone] 08/20/2020

## 2020-08-20 NOTE — Progress Notes (Signed)
Offered to have a walk along the hallway but refused due to pain. Sleeping most of the time.  To try later again.

## 2020-08-20 NOTE — Progress Notes (Signed)
ANTICOAGULATION CONSULT NOTE - Follow Up Consult  Pharmacy Consult for IV Heparin Indication: DVT  Allergies  Allergen Reactions  . Bee Venom Anaphylaxis  . Coffea Arabica Shortness Of Breath  . Coffee Flavor Shortness Of Breath  . Cheese Swelling    Blue Cheese only    Patient Measurements: Height: 5\' 10"  (177.8 cm) Weight: 93.3 kg (205 lb 11 oz) IBW/kg (Calculated) : 73 Heparin Dosing Weight: 93 kg  Vital Signs: Temp: 97.9 F (36.6 C) (03/05 0733) Temp Source: Oral (03/05 0733) BP: 130/74 (03/05 0733) Pulse Rate: 66 (03/05 0733)  Labs: Recent Labs    08/18/20 0312 08/18/20 1012 08/18/20 1747 08/19/20 0145 08/20/20 0502  HGB 9.2*  --   --  9.2* 10.9*  HCT 28.2*  --   --  29.6* 33.1*  PLT 594*  --   --  604* 668*  HEPARINUNFRC 0.19*   < > 0.33 0.36 0.34  CREATININE 1.17  --   --  0.89  --    < > = values in this interval not displayed.    Estimated Creatinine Clearance: 103.8 mL/min (by C-G formula based on SCr of 0.89 mg/dL).   Medical History: Past Medical History:  Diagnosis Date  . CHF (congestive heart failure) (HCC)   . Diabetes mellitus without complication (HCC)   . Hypercholesteremia   . Hypertension    Assessment: 60 yr old man admitted with empyema, S/P VATs 2/28, now found with acute L DVT. Pharmacy was consulted to dose IV heparin with no bolus; pt was on no anticoagulant PTA. Chest tubes remain in place.   Heparin level continues to be therapeutic at 0.3 on 3000 units/hr , hgb improved in 10s this morning, minimal CT output.   Goal of Therapy:  Heparin level 0.3-0.7 units/ml Monitor platelets by anticoagulation protocol: Yes   Plan:  Continue heparin infusion at 3000 units/hr Daily heparin level and CBC F/U plans for oral [facility]   [facility] PharmD., BCPS Clinical Pharmacist 08/20/2020 7:59 AM

## 2020-08-20 NOTE — Progress Notes (Signed)
PROGRESS NOTE   Timothy [name].  OZD:[id number] DOB: 1960-09-18 DOA: 08/11/2020 PCP: Rolm Gala, NP  Brief Narrative:  60 year old previously home dwelling male HTN DM TY 2 with hyperglycemia HFpEF Peripheral vascular disease + DES lower extremity-osteomyelitis with amputation left great toe + group B strep 03/22/2020-Rx Augmentin PTSD plus antisocial disorder  Admit to [facility] left-sided chest pain, spasm + S OB with sleep and exertion + coughing spells blood-tinged sputum + Chills no fever fatigue  CT = large left pleural effusion with multiple loculations-WBC 13 sodium 126 procalcitonin 0.4  Transfer to [facility] 2/28 had VATS with left-sided decortication  Hospital-Problem based course  Acute hypoxic respiratory failure 2/2 empyema Sepsis prior to arrival Management of chest tube imaging as well as outpatient follow-up per thoracic surgeon Zosyn since 2/23 IV White count has been decreasing cultures growing Streptococcus intermedius probably can transition to cefazolin-we will ask pharmacy to look into it PVD + left great toe amputation + stent Imaging performed reveal DVT as below Should probably continue Plavix 75--will need discussion with vascular on Monday with regards to the same Left soleal DVT  continue heparin as above HTN  continue amlodipine 5, clonidine 0.1-blood pressures are stable DM TY 2  blood sugars ranging 1 38-1 50-eating only 20% of meals  Continue Lantus 10 and sliding scale  Resume Metformin 1000 twice daily, continues on gabapentin 1 3 times daily  Hold glipizide until discharge  mild hypervolemic hyponatremia probably from heart failure  holding Entresto from prior to admission at this time  Monitor only at this time PTSD antisocial personality disorder insomnia  Continue Celexa 20 daily Left-sided abdominal pain  DVT prophylaxis: Heparin GTT Code Status: Full Family Communication: None present Disposition:  Status is:  Inpatient  Remains inpatient appropriate because:Hemodynamically unstable, Persistent severe electrolyte disturbances, Ongoing active pain requiring inpatient pain management and Altered mental status   Dispo:  Patient From: Home  Planned Disposition: Home  Medically stable for discharge: No         Consultants:   CVTS [id number]  Procedures: None at present  Antimicrobials: Zosyn currently   Subjective: Awake alert coherent sitting up in the bed no distress no fever States chest pain on lying flat on his back No nausea no vomiting Passing stool  Objective: Vitals:   08/20/20 0335 08/20/20 0546 08/20/20 0733 08/20/20 1111  BP: 104/80  130/74 132/77  Pulse: 94 71 66 66  Resp: 19 11 18 16   Temp: 97.9 F (36.6 C)  97.9 F (36.6 C) 97.8 F (36.6 C)  TempSrc: Oral  Oral Oral  SpO2: 96% 97% 93% 97%  Weight:      Height:        Intake/Output Summary (Last 24 hours) at 08/20/2020 1326 Last data filed at 08/20/2020 1200 Gross per 24 hour  Intake 659.93 ml  Output 4055 ml  Net -3395.07 ml   Filed Weights   08/11/20 2120  Weight: 93.3 kg    Examination:  EOMI NCAT no focal deficit-he has a slightly flat affect S1-S2 no murmur no rub no gallop Chest clear no added sound no increased egophony or tactile vocal fremitus posterolaterally Chest tube is in the left side Moving all 4 limbs equally slight lower extremity edema Neurologically intact   Data Reviewed: personally reviewed   CBC    Component Value Date/Time   WBC 8.0 08/20/2020 0502   RBC 3.46 (L) 08/20/2020 0502   HGB 10.9 (L) 08/20/2020 0502   HGB  14.6 06/22/2020 1058   HCT 33.1 (L) 08/20/2020 0502   HCT 41.8 06/22/2020 1058   PLT 668 (H) 08/20/2020 0502   PLT 301 06/22/2020 1058   MCV 95.7 08/20/2020 0502   MCV 96 06/22/2020 1058   MCH 31.5 08/20/2020 0502   MCHC 32.9 08/20/2020 0502   RDW 13.5 08/20/2020 0502   RDW 13.1 06/22/2020 1058   LYMPHSABS 1.4 08/13/2020 0226   LYMPHSABS 1.2  06/22/2020 1058   MONOABS 1.0 08/13/2020 0226   EOSABS 0.1 08/13/2020 0226   EOSABS 0.1 06/22/2020 1058   BASOSABS 0.0 08/13/2020 0226   BASOSABS 0.0 06/22/2020 1058   CMP Latest Ref Rng & Units 08/19/2020 08/18/2020 08/17/2020  Glucose 70 - 99 mg/dL 546(E) 703(J) 009(F)  BUN 6 - 20 mg/dL 6 9 14   Creatinine 0.61 - 1.24 mg/dL 8.18 2.99  Sodium 135 - 145 mmol/L 132(L) 133(L) 133(L)  Potassium 3.5 - 5.1 mmol/L 4.4 4.3 4.2  Chloride 98 - 111 mmol/L 97(L) 97(L) 96(L)  CO2 22 - 32 mmol/L 25 28 29   Calcium 8.9 - 10.3 mg/dL 3.71) 7.9(L) 7.7(L)  Total Protein 6.5 - 8.1 g/dL - 5.8(L) 5.6(L)  Total Bilirubin 0.3 - 1.2 mg/dL - 0.7 0.5  Alkaline Phos 38 - 126 U/L - 143(H) 115  AST 15 - 41 U/L - 12(L) 12(L)  ALT 0 - 44 U/L - 11 11     Radiology Studies: DG Chest 2 View  Result Date: 08/20/2020 CLINICAL DATA:  Follow-up surgery EXAM: CHEST - 2 VIEW COMPARISON:  August 19, 2020 FINDINGS: A left chest tube terminates in the left apex, unchanged. No pneumothorax identified. Stable left effusion and underlying opacity. The cardiomediastinal silhouette is stable. The right lung remains clear. No other acute interval changes. IMPRESSION: 1. Stable left chest tube without pneumothorax. 2. Stable effusion and opacity in the left mid lower lung. Electronically Signed   By: 10/20/2020 III M.D   On: 08/20/2020 08:16   DG CHEST PORT 1 VIEW  Result Date: 08/19/2020 CLINICAL DATA:  Empyema EXAM: PORTABLE CHEST 1 VIEW COMPARISON:  08/18/2020 FINDINGS: Pulmonary insufflation is stable with mild left-sided volume loss. Large bore left chest tube is unchanged. Small lateral and basilar loculated left pleural effusion is again seen and appears slightly enlarged since prior examination. Superimposed infiltrate within the left mid and lower lung zone persists. Right lung is clear. No pneumothorax. No pleural effusion on the right. Cardiac size within normal limits. IMPRESSION: Slight interval increase in size in a  loculated lateral and basilar left pleural effusion. Stable associated left basilar pulmonary infiltrate. Left chest tube in place.  No pneumothorax. Electronically Signed   By: 10/19/2020 MD   On: 08/19/2020 07:03     Scheduled Meds: . acetaminophen  1,000 mg Oral Q6H   Or  . acetaminophen (TYLENOL) oral liquid 160 mg/5 mL  1,000 mg Oral Q6H  . amLODipine  5 mg Oral Daily  . bisacodyl  10 mg Oral Daily  . citalopram  20 mg Oral Daily  . cloNIDine  0.1 mg Oral QHS  . gabapentin  300 mg Oral TID  . guaiFENesin  1,200 mg Oral BID  . insulin aspart  0-15 Units Subcutaneous TID WC  . insulin aspart  0-5 Units Subcutaneous QHS  . insulin glargine  10 Units Subcutaneous Daily  . magnesium oxide  400 mg Oral Daily  . nicotine  21 mg Transdermal Daily  . pantoprazole  40 mg Oral Daily  .  polyethylene glycol  17 g Oral Daily  . rosuvastatin  5 mg Oral Daily  . saccharomyces boulardii  250 mg Oral BID  . senna  1 tablet Oral Daily   Continuous Infusions: . heparin 3,000 Units/hr (08/20/20 0903)  . lactated ringers Stopped (08/15/20 1524)  . piperacillin-tazobactam (ZOSYN)  IV 3.375 g (08/20/20 0553)     LOS: 9 days   Time spent: 25  Rhetta Mura, MD [facility] To contact the attending provider between 7A-7P or the covering provider during after hours 7P-7A, please log into the web site [email or web address] and access using universal  password for that web site. If you do not have the password, please call the hospital operator.  08/20/2020, 1:26 PM

## 2020-08-21 ENCOUNTER — Inpatient Hospital Stay (HOSPITAL_COMMUNITY): Payer: Medicaid Other

## 2020-08-21 LAB — COMPREHENSIVE METABOLIC PANEL
ALT: 13 U/L (ref 0–44)
AST: 14 U/L — ABNORMAL LOW (ref 15–41)
Albumin: 2 g/dL — ABNORMAL LOW (ref 3.5–5.0)
Alkaline Phosphatase: 194 U/L — ABNORMAL HIGH (ref 38–126)
Anion gap: 11 (ref 5–15)
BUN: 5 mg/dL — ABNORMAL LOW (ref 6–20)
CO2: 28 mmol/L (ref 22–32)
Calcium: 8.7 mg/dL — ABNORMAL LOW (ref 8.9–10.3)
Chloride: 93 mmol/L — ABNORMAL LOW (ref 98–111)
Creatinine, Ser: 0.9 mg/dL (ref 0.61–1.24)
GFR, Estimated: >60 mL/min (ref >60)
Glucose, Bld: 132 mg/dL — ABNORMAL HIGH (ref 70–99)
Potassium: 4.3 mmol/L (ref 3.5–5.1)
Sodium: 132 mmol/L — ABNORMAL LOW (ref 135–145)
Total Bilirubin: 0.7 mg/dL (ref 0.3–1.2)
Total Protein: 6.9 g/dL (ref 6.5–8.1)

## 2020-08-21 LAB — CBC
HCT: 31.5 % — ABNORMAL LOW (ref 39.0–52.0)
Hemoglobin: 10.4 g/dL — ABNORMAL LOW (ref 13.0–17.0)
MCH: 31.6 pg (ref 26.0–34.0)
MCHC: 33 g/dL (ref 30.0–36.0)
MCV: 95.7 fL (ref 80.0–100.0)
Platelets: 646 10*3/uL — ABNORMAL HIGH (ref 150–400)
RBC: 3.29 MIL/uL — ABNORMAL LOW (ref 4.22–5.81)
RDW: 13.7 % (ref 11.5–15.5)
WBC: 9.8 10*3/uL (ref 4.0–10.5)
nRBC: 0 % (ref 0.0–0.2)

## 2020-08-21 LAB — HEPARIN LEVEL (UNFRACTIONATED): Heparin Unfractionated: 0.29 IU/mL — ABNORMAL LOW (ref 0.30–0.70)

## 2020-08-21 LAB — GLUCOSE, CAPILLARY
Glucose-Capillary: 140 mg/dL — ABNORMAL HIGH (ref 70–99)
Glucose-Capillary: 153 mg/dL — ABNORMAL HIGH (ref 70–99)
Glucose-Capillary: 134 mg/dL — ABNORMAL HIGH (ref 70–99)
Glucose-Capillary: 172 mg/dL — ABNORMAL HIGH (ref 70–99)

## 2020-08-21 NOTE — Progress Notes (Signed)
6 Days Post-Op Procedure(s) (LRB): VIDEO ASSISTED THORACOSCOPY (VATS)/DECORTICATION (Left) Subjective: Feels better today  Objective: Vital signs in last 24 hours: Temp:  [97.6 F (36.4 C)-98.9 F (37.2 C)] 98.2 F (36.8 C) (03/06 0740) Pulse Rate:  [66-85] 76 (03/06 0740) Cardiac Rhythm: Normal sinus rhythm (03/06 0721) Resp:  [15-19] 17 (03/06 0740) BP: (113-132)/(65-77) 117/65 (03/06 0740) SpO2:  [94 %-98 %] 94 % (03/06 0740)  Hemodynamic parameters for last 24 hours:    Intake/Output from previous day: 03/05 0701 - 03/06 0700 In: 799.6 [P.O.:120; I.V.:629.6; IV Piggyback:50] Out: 1865 [Urine:1825; Chest Tube:40] Intake/Output this shift: No intake/output data recorded.  General appearance: alert, cooperative and no distress Neurologic: intact Heart: regular rate and rhythm Lungs: diminished breath sounds bibasilar no air leak, serous drainage from CT  Lab Results: Recent Labs    08/20/20 0502 08/21/20 0158  WBC 8.0 9.8  HGB 10.9* 10.4*  HCT 33.1* 31.5*  PLT 668* 646*   BMET:  Recent Labs    08/19/20 0145 08/21/20 0158  NA 132* 132*  K 4.4 4.3  CL 97* 93*  CO2 25 28  GLUCOSE 131* 132*  BUN 6 5*  CREATININE 0.89 0.90  CALCIUM 8.5* 8.7*    PT/INR: No results for input(s): LABPROT, INR in the last 72 hours. ABG    Component Value Date/Time   PHART 7.351 08/15/2020 1545   HCO3 27.8 08/15/2020 1545   TCO2 31 08/15/2020 1446   O2SAT 88.2 08/15/2020 1545   CBG (last 3)  Recent Labs    08/20/20 1556 08/20/20 2106 08/21/20 0606  GLUCAP 128* 175* 140*    Assessment/Plan: S/P Procedure(s) (LRB): VIDEO ASSISTED THORACOSCOPY (VATS)/DECORTICATION (Left) -Making good progress Needs to ambulate more Pain control is good Rocephin IV Will get noncontrast CT to evaluate lateral opacity on CXR  LOS: 10 days    Loreli Slot 08/21/2020

## 2020-08-21 NOTE — Progress Notes (Signed)
ANTICOAGULATION CONSULT NOTE - Follow Up Consult  Pharmacy Consult for IV Heparin Indication: DVT  Allergies  Allergen Reactions  . Bee Venom Anaphylaxis  . Coffea Arabica Shortness Of Breath  . Coffee Flavor Shortness Of Breath  . Cheese Swelling    Blue Cheese only    Patient Measurements: Height: 5\' 10"  (177.8 cm) Weight: 93.3 kg (205 lb 11 oz) IBW/kg (Calculated) : 73 Heparin Dosing Weight: 93 kg  Vital Signs: Temp: 98.2 F (36.8 C) (03/06 0740) Temp Source: Oral (03/06 0740) BP: 117/65 (03/06 0740) Pulse Rate: 76 (03/06 0740)  Labs: Recent Labs    08/19/20 0145 08/20/20 0502 08/21/20 0158  HGB 9.2* 10.9* 10.4*  HCT 29.6* 33.1* 31.5*  PLT 604* 668* 646*  HEPARINUNFRC 0.36 0.34 0.29*  CREATININE 0.89  --  0.90    Estimated Creatinine Clearance: 102.6 mL/min (by C-G formula based on SCr of 0.9 mg/dL).   Medical History: Past Medical History:  Diagnosis Date  . CHF (congestive heart failure) (HCC)   . Diabetes mellitus without complication (HCC)   . Hypercholesteremia   . Hypertension    Assessment: 60 yr old man admitted with empyema, S/P VATs 2/28, now found with acute L DVT. Pharmacy was consulted to dose IV heparin with no bolus; pt was on no anticoagulant PTA. Chest tubes remain in place.   Heparin level just below goal this morning at 0.29 on 3000 units/hr , hgb stable in 10s this morning, minimal CT output.   Goal of Therapy:  Heparin level 0.3-0.7 units/ml Monitor platelets by anticoagulation protocol: Yes   Plan:  Increase heparin infusion to 3100 units/hr to keep within range Daily heparin level and CBC F/U plans for oral [facility]  [facility] PharmD., BCPS Clinical Pharmacist 08/21/2020 7:56 AM

## 2020-08-21 NOTE — Progress Notes (Addendum)
PROGRESS NOTE   Timothy [name].  LFY:[id number] DOB: September 06, 1960 DOA: 08/11/2020 PCP: Rolm Gala, NP  Brief Narrative:  60 year old previously home dwelling male HTN DM TY 2 with hyperglycemia HFpEF Peripheral vascular disease + DES lower extremity-osteomyelitis with amputation left great toe + group B strep 03/22/2020-Rx Augmentin PTSD plus antisocial disorder  Admit to [facility] left-sided chest pain, spasm + S OB with sleep and exertion + coughing spells blood-tinged sputum + Chills no fever fatigue  CT = large left pleural effusion with multiple loculations-WBC 13 sodium 126 procalcitonin 0.4  Transfer to [facility] 2/28 had VATS with left-sided decortication  Hospital-Problem based course  Acute hypoxic respiratory failure 2/2 empyema-growing pansensitive Streptococcus intermedius Sepsis prior to arrival Appreciate CVTS input-CT scan ordered to delineate lungs better Zosyn since 2/23-->ceftriaxone 3/5 to cover strep Further planning depending on chest tube placement and/or removal PVD + left great toe amputation + stent Imaging performed reveal DVT as below Holding long-term Plavix 75-Dr. Lenell Antu [facility] recommends only Eliquis at this time Left soleal DVT  continue heparin at this time and transition to DOAC once no more procedures are planned HTN  continue amlodipine 5, clonidine 0.1-blood pressures are stable DM TY 2  CBG up to 180 0-eating only 20% of meals  Continue Lantus 10 and sliding scale  Resume Metformin 1000 twice daily, continues on gabapentin 1 3 times daily  Hold glipizide until discharge  mild hypervolemic hyponatremia probably from heart failure  holding Entresto from prior to admission at this time  Monitor only at this time PTSD antisocial personality disorder insomnia  Continue Celexa 20 daily Left-sided abdominal pain  DVT prophylaxis: Heparin GTT Code Status: Full Family Communication: None present Disposition:  Status is:  Inpatient  Remains inpatient appropriate because:Hemodynamically unstable, Persistent severe electrolyte disturbances, Ongoing active pain requiring inpatient pain management and Altered mental status   Dispo:  Patient From: Home  Planned Disposition: Home  Medically stable for discharge: No         Consultants:   CVTS  Procedures: None at present  Antimicrobials: Zosyn currently   Subjective:  Fair slightly flat affect Frustrated with the chest tube CT was done this morning and he is hopeful that they can be removed States pain is 5/10  Objective: Vitals:   08/20/20 2300 08/21/20 0000 08/21/20 0300 08/21/20 0740  BP: 120/70  115/75 117/65  Pulse: 75 74 78 76  Resp: 17 18 17 17   Temp: 98.9 F (37.2 C)  98.7 F (37.1 C) 98.2 F (36.8 C)  TempSrc: Oral  Oral Oral  SpO2: 96% 97% 96% 94%  Weight:      Height:        Intake/Output Summary (Last 24 hours) at 08/21/2020 1029 Last data filed at 08/21/2020 1000 Gross per 24 hour  Intake 709.64 ml  Output 1765 ml  Net -1055.36 ml   Filed Weights   08/11/20 2120  Weight: 93.3 kg    Examination:  Awake coherent no distress slightly flat affect Chest clear posterolaterally bilaterally S1-S2 no murmur sinus rhythm on monitors Abdomen soft no rebound No lower extremity edema  Data Reviewed: personally reviewed   CBC    Component Value Date/Time   WBC 9.8 08/21/2020 0158   RBC 3.29 (L) 08/21/2020 0158   HGB 10.4 (L) 08/21/2020 0158   HGB 14.6 06/22/2020 1058   HCT 31.5 (L) 08/21/2020 0158   HCT 41.8 06/22/2020 1058   PLT 646 (H) 08/21/2020 0158   PLT 301  06/22/2020 1058   MCV 95.7 08/21/2020 0158   MCV 96 06/22/2020 1058   MCH 31.6 08/21/2020 0158   MCHC 33.0 08/21/2020 0158   RDW 13.7 08/21/2020 0158   RDW 13.1 06/22/2020 1058   LYMPHSABS 1.4 08/13/2020 0226   LYMPHSABS 1.2 06/22/2020 1058   MONOABS 1.0 08/13/2020 0226   EOSABS 0.1 08/13/2020 0226   EOSABS 0.1 06/22/2020 1058   BASOSABS 0.0  08/13/2020 0226   BASOSABS 0.0 06/22/2020 1058   CMP Latest Ref Rng & Units 08/21/2020 08/19/2020 08/18/2020  Glucose 70 - 99 mg/dL 262(M) 355(H) 741(U)  BUN 6 - 20 mg/dL 5(L) 6 9  Creatinine 3.84 - 1.24 mg/dL 5.36 4.68 0.32  Sodium 135 - 145 mmol/L 132(L) 132(L) 133(L)  Potassium 3.5 - 5.1 mmol/L 4.3 4.4 4.3  Chloride 98 - 111 mmol/L 93(L) 97(L) 97(L)  CO2 22 - 32 mmol/L 28 25 28   Calcium 8.9 - 10.3 mg/dL ) 1.2(Y) 7.9(L)  Total Protein 6.5 - 8.1 g/dL 6.9 - 5.8(L)  Total Bilirubin 0.3 - 1.2 mg/dL 0.7 - 0.7  Alkaline Phos 38 - 126 U/L 194(H) - 143(H)  AST 15 - 41 U/L 14(L) - 12(L)  ALT 0 - 44 U/L 13 - 11     Radiology Studies: DG Chest 2 View  Result Date: 08/20/2020 CLINICAL DATA:  Follow-up surgery EXAM: CHEST - 2 VIEW COMPARISON:  August 19, 2020 FINDINGS: A left chest tube terminates in the left apex, unchanged. No pneumothorax identified. Stable left effusion and underlying opacity. The cardiomediastinal silhouette is stable. The right lung remains clear. No other acute interval changes. IMPRESSION: 1. Stable left chest tube without pneumothorax. 2. Stable effusion and opacity in the left mid lower lung. Electronically Signed   By: August 21, 2020 III M.D   On: 08/20/2020 08:16   DG CHEST PORT 1 VIEW  Result Date: 08/21/2020 CLINICAL DATA:  Empyema.  History of CHF. EXAM: PORTABLE CHEST 1 VIEW COMPARISON:  August 20, 2020 FINDINGS: The left chest tube is in good position. No pneumothorax. The left-sided effusion and underlying opacity is stable. The cardiomediastinal silhouette is stable. No other interval changes. IMPRESSION: 1. The left chest tube remains in place without pneumothorax. 2. Effusion and opacity in the left mid lower lung is stable. No other changes. Electronically Signed   By: August 22, 2020 III M.D   On: 08/21/2020 07:42     Scheduled Meds: . amLODipine  5 mg Oral Daily  . bisacodyl  10 mg Oral Daily  . citalopram  20 mg Oral Daily  . cloNIDine  0.1 mg Oral QHS  .  gabapentin  300 mg Oral TID  . guaiFENesin  1,200 mg Oral BID  . insulin aspart  0-15 Units Subcutaneous TID WC  . insulin aspart  0-5 Units Subcutaneous QHS  . insulin glargine  10 Units Subcutaneous Daily  . magnesium oxide  400 mg Oral Daily  . metFORMIN  1,000 mg Oral BID WC  . nicotine  21 mg Transdermal Daily  . pantoprazole  40 mg Oral Daily  . polyethylene glycol  17 g Oral Daily  . rosuvastatin  5 mg Oral Daily  . saccharomyces boulardii  250 mg Oral BID  . senna  1 tablet Oral Daily   Continuous Infusions: . cefTRIAXone (ROCEPHIN)  IV 2 g (08/20/20 2132)  . heparin 3,100 Units/hr (08/21/20 0826)  . lactated ringers Stopped (08/15/20 1524)     LOS: 10 days   Time spent: [address],  MD [facility] To contact the attending provider between 7A-7P or the covering provider during after hours 7P-7A, please log into the web site [email or web address] and access using universal [place] password for that web site. If you do not have the password, please call the hospital operator.  08/21/2020, 10:29 AM

## 2020-08-22 LAB — COMPREHENSIVE METABOLIC PANEL
ALT: 12 U/L (ref 0–44)
AST: 14 U/L — ABNORMAL LOW (ref 15–41)
Albumin: 2 g/dL — ABNORMAL LOW (ref 3.5–5.0)
Alkaline Phosphatase: 165 U/L — ABNORMAL HIGH (ref 38–126)
Anion gap: 13 (ref 5–15)
BUN: 6 mg/dL (ref 6–20)
CO2: 24 mmol/L (ref 22–32)
Calcium: 8.5 mg/dL — ABNORMAL LOW (ref 8.9–10.3)
Chloride: 94 mmol/L — ABNORMAL LOW (ref 98–111)
Creatinine, Ser: 0.94 mg/dL (ref 0.61–1.24)
GFR, Estimated: >60 mL/min (ref >60)
Glucose, Bld: 183 mg/dL — ABNORMAL HIGH (ref 70–99)
Potassium: 4.1 mmol/L (ref 3.5–5.1)
Sodium: 131 mmol/L — ABNORMAL LOW (ref 135–145)
Total Bilirubin: 0.3 mg/dL (ref 0.3–1.2)
Total Protein: 6.7 g/dL (ref 6.5–8.1)

## 2020-08-22 LAB — GLUCOSE, CAPILLARY
Glucose-Capillary: 141 mg/dL — ABNORMAL HIGH (ref 70–99)
Glucose-Capillary: 145 mg/dL — ABNORMAL HIGH (ref 70–99)
Glucose-Capillary: 110 mg/dL — ABNORMAL HIGH (ref 70–99)
Glucose-Capillary: 106 mg/dL — ABNORMAL HIGH (ref 70–99)
Glucose-Capillary: 181 mg/dL — ABNORMAL HIGH (ref 70–99)

## 2020-08-22 LAB — CBC
HCT: 30.1 % — ABNORMAL LOW (ref 39.0–52.0)
Hemoglobin: 9.6 g/dL — ABNORMAL LOW (ref 13.0–17.0)
MCH: 31 pg (ref 26.0–34.0)
MCHC: 31.9 g/dL (ref 30.0–36.0)
MCV: 97.1 fL (ref 80.0–100.0)
Platelets: 555 10*3/uL — ABNORMAL HIGH (ref 150–400)
RBC: 3.1 MIL/uL — ABNORMAL LOW (ref 4.22–5.81)
RDW: 13.8 % (ref 11.5–15.5)
WBC: 7.9 10*3/uL (ref 4.0–10.5)
nRBC: 0 % (ref 0.0–0.2)

## 2020-08-22 LAB — HEPARIN LEVEL (UNFRACTIONATED): Heparin Unfractionated: 0.43 IU/mL (ref 0.30–0.70)

## 2020-08-22 NOTE — Progress Notes (Addendum)
[address]       [place] [address]             [phone]      7 Days Post-Op Procedure(s) (LRB): VIDEO ASSISTED THORACOSCOPY (VATS)/DECORTICATION (Left) Subjective: Feels okay, thinks he is getting better with each day in the hospital  Objective: Vital signs in last 24 hours: Temp:  [97.9 F (36.6 C)-98.4 F (36.9 C)] 98 F (36.7 C) (03/07 0300) Pulse Rate:  [67-78] 67 (03/07 0300) Cardiac Rhythm: Normal sinus rhythm (03/07 0400) Resp:  [13-20] 13 (03/07 0300) BP: (95-121)/(65-82) 121/71 (03/07 0300) SpO2:  [94 %-97 %] 96 % (03/07 0300)     Intake/Output from previous day: 03/06 0701 - 03/07 0700 In: 1069.4 [P.O.:240; I.V.:729.4; IV Piggyback:100] Out: 1645 [Urine:1625; Chest Tube:20] Intake/Output this shift: No intake/output data recorded.  General appearance: alert, cooperative and no distress Heart: regular rate and rhythm, S1, S2 normal, no murmur, click, rub or gallop Lungs: clear to auscultation bilaterally Abdomen: soft, non-tender; bowel sounds normal; no masses,  no organomegaly Extremities: extremities normal, atraumatic, no cyanosis or edema Wound: clean and dry  Lab Results: Recent Labs    08/21/20 0158 08/22/20 0100  WBC 9.8 7.9  HGB 10.4* 9.6*  HCT 31.5* 30.1*  PLT 646* 555*   BMET:  Recent Labs    08/21/20 0158 08/22/20 0100  NA 132* 131*  K 4.3 4.1  CL 93* 94*  CO2 28 24  GLUCOSE 132* 183*  BUN 5* 6  CREATININE 0.90 0.94  CALCIUM 8.7* 8.5*    PT/INR: No results for input(s): LABPROT, INR in the last 72 hours. ABG    Component Value Date/Time   PHART 7.351 08/15/2020 1545   HCO3 27.8 08/15/2020 1545   TCO2 31 08/15/2020 1446   O2SAT 88.2 08/15/2020 1545   CBG (last 3)  Recent Labs    08/21/20 1611 08/21/20 2105 08/22/20 0547  GLUCAP 134* 172* 141*    Assessment/Plan: S/P Procedure(s) (LRB): VIDEO ASSISTED THORACOSCOPY (VATS)/DECORTICATION (Left)  1. Chest tube with scant drainage. No  pneumo on CXR, left mid effusion and opacity. CT scan listed below, 2. NSR in the 60s, BP well controlled. Continue Norvasc 3. Continue Rocephin for empyema 4. On IV heparin for DVT, pharm following   Chest CT 3/6:  IMPRESSION: 1. The left-sided empyema is much smaller in the interval with less pleural fluid. However, there is still a rind of pleural fluid, most of which is not free-flowing and apparently loculated. The chest tube on the left, placed in the interval, courses through this fluid. There is associated atelectasis. 2. Air in the left pleural space is likely due to the chest tube. 3. Patchy opacities in the right upper lobe, likely infectious. Recommend attention on follow-up. 4. There is a 2.3 cm nodular region in the medial left upper lobe abutting the mediastinum as seen on coronal image 52. This could represent focal pleural thickening or atelectasis given recent pleural fluid in this region. A neoplasm cannot be excluded on this study alone. Recommend short-term follow-up imaging to ensure resolution. 5. Calcified atherosclerosis in the nonaneurysmal aorta. 6. Coronary artery calcifications. 7. New small right pleural effusion.  Plan: May be able to remove chest tube today. It is currently on water seal without air leak. Continue abx. Continue ambulation around the unit. CXR in the morning.    LOS: 11 days    [name] 08/22/2020   Agree with above Will remove  CT today Follow-up CXR tomorrow  Corliss Skains

## 2020-08-22 NOTE — Progress Notes (Signed)
PROGRESS NOTE   Timothy [name].  HEN:[id number] DOB: 07-17-1960 DOA: 08/11/2020 PCP: Rolm Gala, NP  Brief Narrative:  60 year old previously home dwelling male HTN DM TY 2 with hyperglycemia HFpEF Peripheral vascular disease + DES lower extremity-osteomyelitis with amputation left great toe + group B strep 03/22/2020-Rx Augmentin PTSD plus antisocial disorder  Admit to [facility] left-sided chest pain, spasm + S OB with sleep and exertion + coughing spells blood-tinged sputum + Chills no fever fatigue  CT = large left pleural effusion with multiple loculations-WBC 13 sodium 126 procalcitonin 0.4  Transfer to [facility] 2/28 had VATS with left-sided decortication  Hospital-Problem based course  Acute hypoxic respiratory failure 2/2 empyema-growing pansensitive Streptococcus intermedius Sepsis prior to arrival Appreciate CVTS input-CT scan shows small R&D therefore chest tube removed 3/7 Zosyn since 2/23-->ceftriaxone 3/5 to cover strep--- defer to CVTS de-escalation of antibiotics and duration PVD + left great toe amputation + stent Imaging performed reveal DVT as below Holding long-term Plavix 75-Dr. Lenell Antu [facility] on 3/6 recommended only Eliquis at this time Left soleal DVT  continue heparin at this time and transition to DOAC once no more procedures  are planned HTN  continue amlodipine 5, clonidine 0.1-blood pressures are stable DM TY 2  CBG 100-1 40, eating 80% meals  Continue Lantus 10 and sliding scale  Resume Metformin 1000 twice daily, continues on gabapentin 1 3 times daily  Hold glipizide until discharge  mild hypervolemic hyponatremia probably from heart failure  resuming Entresto 3/7  -5 L since admission PTSD antisocial personality disorder insomnia  Continue Celexa 20 daily Left-sided abdominal pain  DVT prophylaxis: Heparin GTT Code Status: Full Family Communication: None present Disposition:  Status is: Inpatient  Remains inpatient  appropriate because:Hemodynamically unstable, Persistent severe electrolyte disturbances, Ongoing active pain requiring inpatient pain management and Altered mental status   Dispo:  Patient From: Home  Planned Disposition: Home probably 3/8  Medically stable for discharge: No         Consultants:   CVTS  Procedures: None at present  Antimicrobials: Zosyn currently   Subjective:  Looks much happier not in much pain chest tube just removed no fever no chills Smiling  Objective: Vitals:   08/22/20 0300 08/22/20 0822 08/22/20 1122 08/22/20 1625  BP: 121/71 106/74 116/73   Pulse: 67 68 66   Resp: 13 16 14    Temp: 98 F (36.7 C) 98.2 F (36.8 C) 98.3 F (36.8 C) 98 F (36.7 C)  TempSrc: Oral Oral Oral Oral  SpO2: 96% 96% 94%   Weight:      Height:        Intake/Output Summary (Last 24 hours) at 08/22/2020 1708 Last data filed at 08/22/2020 1430 Gross per 24 hour  Intake 816.06 ml  Output 345 ml  Net 471.06 ml   Filed Weights   08/11/20 2120  Weight: 93.3 kg    Examination:  Pleasant coherent no distress EOMI NCAT no focal deficit Chest clear no added sound Abdomen soft no rebound No lower extremity edema ROM intact. No rales or rhonchi  Data Reviewed: personally reviewed   CBC    Component Value Date/Time   WBC 7.9 08/22/2020 0100   RBC 3.10 (L) 08/22/2020 0100   HGB 9.6 (L) 08/22/2020 0100   HGB 14.6 06/22/2020 1058   HCT 30.1 (L) 08/22/2020 0100   HCT 41.8 06/22/2020 1058   PLT 555 (H) 08/22/2020 0100   PLT 301 06/22/2020 1058   MCV 97.1 08/22/2020 0100  MCV 96 06/22/2020 1058   MCH 31.0 08/22/2020 0100   MCHC 31.9 08/22/2020 0100   RDW 13.8 08/22/2020 0100   RDW 13.1 06/22/2020 1058   LYMPHSABS 1.4 08/13/2020 0226   LYMPHSABS 1.2 06/22/2020 1058   MONOABS 1.0 08/13/2020 0226   EOSABS 0.1 08/13/2020 0226   EOSABS 0.1 06/22/2020 1058   BASOSABS 0.0 08/13/2020 0226   BASOSABS 0.0 06/22/2020 1058   CMP Latest Ref Rng & Units 08/22/2020  08/21/2020 08/19/2020  Glucose 70 - 99 mg/dL 130(Q) 657(Q) 469(G)  BUN 6 - 20 mg/dL 6 5(L) 6  Creatinine 2.95 - 1.24 mg/dL 2.84 1.32 4.40  Sodium 135 - 145 mmol/L 131(L) 132(L) 132(L)  Potassium 3.5 - 5.1 mmol/L 4.1 4.3 4.4  Chloride 98 - 111 mmol/L 94(L) 93(L) 97(L)  CO2 22 - 32 mmol/L 24 28 25   Calcium 8.9 - 10.3 mg/dL ) 1.0(U) 7.2(Z)  Total Protein 6.5 - 8.1 g/dL 6.7 6.9 -  Total Bilirubin 0.3 - 1.2 mg/dL 0.3 0.7 -  Alkaline Phos 38 - 126 U/L 165(H) 194(H) -  AST 15 - 41 U/L 14(L) 14(L) -  ALT 0 - 44 U/L 12 13 -     Radiology Studies: CT CHEST WO CONTRAST  Result Date: 08/21/2020 CLINICAL DATA:  Empyema follow-up. EXAM: CT CHEST WITHOUT CONTRAST TECHNIQUE: Multidetector CT imaging of the chest was performed following the standard protocol without IV contrast. COMPARISON:  CT scan August 10, 2020 FINDINGS: Cardiovascular: Mild calcified atherosclerosis in the nonaneurysmal aorta. The central pulmonary arteries are normal in caliber. Coronary artery disease in the left coronary arteries. The heart is unchanged. Mediastinum/Nodes: The chest wall is normal. There is a tiny right pleural effusion which is new. No pericardial effusion. There is a new left chest tube in good position. The amount of pleural fluid has significantly decreased. However, a small to moderate amount of pleural fluid remains peripherally. Much of this fluid is likely loculated rather than free-flowing. A small amount of air within the fluid may be due to the chest tube. Lungs/Pleura: Central airways are normal. No pneumothorax on the right. Mild opacity is seen in the posterior right upper lobe on series 6, image 64. Similar opacity is seen laterally in the right upper lobe on series 6, image 78. No other suspicious infiltrates identified on the right. There is fluid and air in the left pleural space. At least some of the air is likely due to the chest tube in place. There is atelectasis associated with the left-sided  effusion. There is a nodular region in the left apex abutting the mediastinum measuring up to 2.3 cm on coronal image 52. This finding is also seen on axial image 38 of series 6. No other nodules or masses. Upper Abdomen: No acute abnormality. Musculoskeletal: No chest wall mass or suspicious bone lesions identified. IMPRESSION: 1. The left-sided empyema is much smaller in the interval with less pleural fluid. However, there is still a rind of pleural fluid, most of which is not free-flowing and apparently loculated. The chest tube on the left, placed in the interval, courses through this fluid. There is associated atelectasis. 2. Air in the left pleural space is likely due to the chest tube. 3. Patchy opacities in the right upper lobe, likely infectious. Recommend attention on follow-up. 4. There is a 2.3 cm nodular region in the medial left upper lobe abutting the mediastinum as seen on coronal image 52. This could represent focal pleural thickening or atelectasis given recent pleural fluid  in this region. A neoplasm cannot be excluded on this study alone. Recommend short-term follow-up imaging to ensure resolution. 5. Calcified atherosclerosis in the nonaneurysmal aorta. 6. Coronary artery calcifications. 7. New small right pleural effusion. Aortic Atherosclerosis (ICD10-I70.0). Electronically Signed   By: Gerome Sam III M.D   On: 08/21/2020 12:36   DG CHEST PORT 1 VIEW  Result Date: 08/21/2020 CLINICAL DATA:  Empyema.  History of CHF. EXAM: PORTABLE CHEST 1 VIEW COMPARISON:  August 20, 2020 FINDINGS: The left chest tube is in good position. No pneumothorax. The left-sided effusion and underlying opacity is stable. The cardiomediastinal silhouette is stable. No other interval changes. IMPRESSION: 1. The left chest tube remains in place without pneumothorax. 2. Effusion and opacity in the left mid lower lung is stable. No other changes. Electronically Signed   By: Gerome Sam III M.D   On: 08/21/2020  07:42     Scheduled Meds: . amLODipine  5 mg Oral Daily  . bisacodyl  10 mg Oral Daily  . citalopram  20 mg Oral Daily  . cloNIDine  0.1 mg Oral QHS  . gabapentin  300 mg Oral TID  . guaiFENesin  1,200 mg Oral BID  . insulin aspart  0-15 Units Subcutaneous TID WC  . insulin aspart  0-5 Units Subcutaneous QHS  . insulin glargine  10 Units Subcutaneous Daily  . magnesium oxide  400 mg Oral Daily  . metFORMIN  1,000 mg Oral BID WC  . nicotine  21 mg Transdermal Daily  . pantoprazole  40 mg Oral Daily  . polyethylene glycol  17 g Oral Daily  . rosuvastatin  5 mg Oral Daily  . saccharomyces boulardii  250 mg Oral BID  . senna  1 tablet Oral Daily   Continuous Infusions: . cefTRIAXone (ROCEPHIN)  IV 2 g (08/21/20 2103)  . heparin 3,100 Units/hr (08/22/20 1622)  . lactated ringers 10 mL/hr at 08/22/20 0857     LOS: 11 days   Time spent: 15  Rhetta Mura, MD [facility] To contact the attending provider between 7A-7P or the covering provider during after hours 7P-7A, please log into the web site [email or web address] and access using universal [place] password for that web site. If you do not have the password, please call the hospital operator.  08/22/2020, 5:08 PM

## 2020-08-22 NOTE — Progress Notes (Signed)
Left chest tube removed at 1545 without complications. Black suture removed, white tie suture closed at chest tube site. Small guaze placed immediately over chest tube site with large Tegaderm over top. Patient tolerated removal well.

## 2020-08-22 NOTE — Progress Notes (Signed)
ANTICOAGULATION CONSULT NOTE - Follow Up Consult  Pharmacy Consult for IV Heparin Indication: DVT  Allergies  Allergen Reactions  . Bee Venom Anaphylaxis  . Coffea Arabica Shortness Of Breath  . Coffee Flavor Shortness Of Breath  . Cheese Swelling    Blue Cheese only    Patient Measurements: Height: 5\' 10"  (177.8 cm) Weight: 93.3 kg (205 lb 11 oz) IBW/kg (Calculated) : 73 Heparin Dosing Weight: 93 kg  Vital Signs: Temp: 98 F (36.7 C) (03/07 0300) Temp Source: Oral (03/07 0300) BP: 121/71 (03/07 0300) Pulse Rate: 67 (03/07 0300)  Labs: Recent Labs    08/20/20 0502 08/21/20 0158 08/22/20 0100  HGB 10.9* 10.4* 9.6*  HCT 33.1* 31.5* 30.1*  PLT 668* 646* 555*  HEPARINUNFRC 0.34 0.29* 0.43  CREATININE  --  0.90 0.94    Estimated Creatinine Clearance: 98.3 mL/min (by C-G formula based on SCr of 0.94 mg/dL).   Medical History: Past Medical History:  Diagnosis Date  . CHF (congestive heart failure) (HCC)   . Diabetes mellitus without complication (HCC)   . Hypercholesteremia   . Hypertension    Assessment: 60 yr old man admitted with empyema, S/P VATs 2/28, now found with acute L DVT. Pharmacy was consulted to dose IV heparin with no bolus; pt was on no anticoagulant PTA. Chest tubes remain in place.   Heparin level therapeutic at 0.43, CBC stable. Chest tube to possibly be removed today.  Goal of Therapy:  Heparin level 0.3-0.7 units/ml Monitor platelets by anticoagulation protocol: Yes   Plan:  Continue heparin 3100 units/hr  Daily heparin level and CBC F/U plans for oral [facility]  [facility], PharmD, BCPS, [facility] Clinical Pharmacist [phone] Please check AMION for all [facility] Pharmacy numbers 08/22/2020

## 2020-08-23 ENCOUNTER — Inpatient Hospital Stay (HOSPITAL_COMMUNITY): Payer: Medicaid Other

## 2020-08-23 ENCOUNTER — Other Ambulatory Visit (HOSPITAL_COMMUNITY): Payer: Self-pay | Admitting: Family Medicine

## 2020-08-23 ENCOUNTER — Inpatient Hospital Stay: Payer: Self-pay

## 2020-08-23 ENCOUNTER — Telehealth: Payer: Self-pay

## 2020-08-23 ENCOUNTER — Other Ambulatory Visit: Payer: Self-pay | Admitting: Family Medicine

## 2020-08-23 LAB — GLUCOSE, CAPILLARY
Glucose-Capillary: 146 mg/dL — ABNORMAL HIGH (ref 70–99)
Glucose-Capillary: 123 mg/dL — ABNORMAL HIGH (ref 70–99)
Glucose-Capillary: 160 mg/dL — ABNORMAL HIGH (ref 70–99)

## 2020-08-23 LAB — CBC
HCT: 33.6 % — ABNORMAL LOW (ref 39.0–52.0)
Hemoglobin: 10.8 g/dL — ABNORMAL LOW (ref 13.0–17.0)
MCH: 31.1 pg (ref 26.0–34.0)
MCHC: 32.1 g/dL (ref 30.0–36.0)
MCV: 96.8 fL (ref 80.0–100.0)
Platelets: 586 10*3/uL — ABNORMAL HIGH (ref 150–400)
RBC: 3.47 MIL/uL — ABNORMAL LOW (ref 4.22–5.81)
RDW: 13.8 % (ref 11.5–15.5)
WBC: 7 10*3/uL (ref 4.0–10.5)
nRBC: 0 % (ref 0.0–0.2)

## 2020-08-23 LAB — COMPREHENSIVE METABOLIC PANEL
ALT: 13 U/L (ref 0–44)
AST: 14 U/L — ABNORMAL LOW (ref 15–41)
Albumin: 2.1 g/dL — ABNORMAL LOW (ref 3.5–5.0)
Alkaline Phosphatase: 151 U/L — ABNORMAL HIGH (ref 38–126)
Anion gap: 10 (ref 5–15)
BUN: 6 mg/dL (ref 6–20)
CO2: 29 mmol/L (ref 22–32)
Calcium: 9 mg/dL (ref 8.9–10.3)
Chloride: 92 mmol/L — ABNORMAL LOW (ref 98–111)
Creatinine, Ser: 0.86 mg/dL (ref 0.61–1.24)
GFR, Estimated: >60 mL/min (ref >60)
Glucose, Bld: 168 mg/dL — ABNORMAL HIGH (ref 70–99)
Potassium: 4.3 mmol/L (ref 3.5–5.1)
Sodium: 131 mmol/L — ABNORMAL LOW (ref 135–145)
Total Bilirubin: 0.5 mg/dL (ref 0.3–1.2)
Total Protein: 7.4 g/dL (ref 6.5–8.1)

## 2020-08-23 LAB — HEPARIN LEVEL (UNFRACTIONATED): Heparin Unfractionated: 0.44 IU/mL (ref 0.30–0.70)

## 2020-08-23 MED ORDER — CHLORHEXIDINE GLUCONATE CLOTH 2 % EX PADS
6.0000 | MEDICATED_PAD | Freq: Every day | CUTANEOUS | Status: DC
  Administered 2020-08-23 – 2020-08-24 (×2): 6 via TOPICAL

## 2020-08-23 MED ORDER — PANTOPRAZOLE SODIUM 40 MG PO TBEC: 40.0000 mg | DELAYED_RELEASE_TABLET | Freq: Every day | ORAL | 3 refills | Status: DC

## 2020-08-23 MED ORDER — NICOTINE 21 MG/24HR TD PT24: 21.0000 mg | MEDICATED_PATCH | Freq: Every day | TRANSDERMAL | 0 refills | Status: DC

## 2020-08-23 MED ORDER — SODIUM CHLORIDE 0.9% FLUSH
10.0000 mL | Freq: Two times a day (BID) | INTRAVENOUS | Status: DC
  Administered 2020-08-23 – 2020-08-24 (×3): 10 mL

## 2020-08-23 MED ORDER — SODIUM CHLORIDE 0.9% FLUSH: 10.0000 mL | INTRAVENOUS | Status: DC | PRN

## 2020-08-23 MED ORDER — APIXABAN 5 MG PO TABS: ORAL_TABLET | ORAL | 3 refills | Status: DC

## 2020-08-23 MED ORDER — SODIUM CHLORIDE 0.9 % IV SOLN
2.0000 g | Freq: Two times a day (BID) | INTRAVENOUS | Status: AC
  Administered 2020-08-23 – 2020-08-24 (×2): 2 g via INTRAVENOUS
  Filled 2020-08-23: qty 2
  Filled 2020-08-23: qty 20

## 2020-08-23 MED ORDER — TRAMADOL HCL 50 MG PO TABS: 50.0000 mg | ORAL_TABLET | Freq: Four times a day (QID) | ORAL | 0 refills | Status: AC | PRN

## 2020-08-23 MED ORDER — CEFTRIAXONE IV (FOR PTA / DISCHARGE USE ONLY): 2.0000 g | INTRAVENOUS | 0 refills | Status: AC

## 2020-08-23 MED FILL — ELIQUIS 5 MG TABLET: 5 | 30 days supply | Qty: 74 | Fill #0

## 2020-08-23 NOTE — Progress Notes (Signed)
Peripherally Inserted Central Catheter Placement  The IV Nurse has discussed with the patient and/or persons authorized to consent for the patient, the purpose of this procedure and the potential benefits and risks involved with this procedure.  The benefits include less needle sticks, lab draws from the catheter, and the patient may be discharged home with the catheter. Risks include, but not limited to, infection, bleeding, blood clot (thrombus formation), and puncture of an artery; nerve damage and irregular heartbeat and possibility to perform a PICC exchange if needed/ordered by physician.  Alternatives to this procedure were also discussed.  Bard Power PICC patient education guide, fact sheet on infection prevention and patient information card has been provided to patient /or left at bedside.    PICC Placement Documentation  PICC Single Lumen 08/23/20 PICC Right Basilic 39 cm 0 cm (Active)  Indication for Insertion or Continuance of Line Home intravenous therapies (PICC only) 08/23/20 1230  Exposed Catheter (cm) 0 cm 08/23/20 1230  Site Assessment Clean;Dry;Intact 08/23/20 1230  Line Status Flushed;Saline locked;Blood return noted 08/23/20 1230  Dressing Type Transparent;Securing device 08/23/20 1230  Dressing Status Clean;Dry;Intact 08/23/20 1230  Antimicrobial disc in place? Yes 08/23/20 1230  Dressing Intervention New dressing 08/23/20 1230  Dressing Change Due 08/30/20 08/23/20 1230       Annett Fabian 08/23/2020, 12:31 PM

## 2020-08-23 NOTE — Progress Notes (Signed)
ANTICOAGULATION CONSULT NOTE - Follow Up Consult  Pharmacy Consult for IV Heparin Indication: DVT  Allergies  Allergen Reactions  . Bee Venom Anaphylaxis  . Coffea Arabica Shortness Of Breath  . Coffee Flavor Shortness Of Breath  . Cheese Swelling    Blue Cheese only    Patient Measurements: Height: 5\' 10"  (177.8 cm) Weight: 93.3 kg (205 lb 11 oz) IBW/kg (Calculated) : 73 Heparin Dosing Weight: 93 kg  Vital Signs: Temp: 97.7 F (36.5 C) (03/08 0300) Temp Source: Oral (03/08 0300) BP: 129/72 (03/08 0300) Pulse Rate: 67 (03/08 0300)  Labs: Recent Labs    08/21/20 0158 08/22/20 0100 08/23/20 0026  HGB 10.4* 9.6* 10.8*  HCT 31.5* 30.1* 33.6*  PLT 646* 555* 586*  HEPARINUNFRC 0.29* 0.43 0.44  CREATININE 0.90 0.94 0.86    Estimated Creatinine Clearance: 107.4 mL/min (by C-G formula based on SCr of 0.86 mg/dL).   Medical History: Past Medical History:  Diagnosis Date  . CHF (congestive heart failure) (HCC)   . Diabetes mellitus without complication (HCC)   . Hypercholesteremia   . Hypertension    Assessment: 60 yr old man admitted with empyema, S/P VATs 2/28, now found with acute L DVT. Pharmacy was consulted to dose IV heparin with no bolus; pt was on no anticoagulant PTA. Chest tubes remain in place.   Heparin level therapeutic at 0.44, CBC stable. Chest tube removed.  Goal of Therapy:  Heparin level 0.3-0.7 units/ml Monitor platelets by anticoagulation protocol: Yes   Plan:  Continue heparin 3100 units/hr  Daily heparin level and CBC F/U plans for oral [facility] - will place [facility] consult to assess copay  [facility], PharmD, BCPS, [facility] Clinical Pharmacist [phone] Please check AMION for all [facility] Pharmacy numbers 08/23/2020

## 2020-08-23 NOTE — Telephone Encounter (Signed)
Order in work queue

## 2020-08-23 NOTE — Telephone Encounter (Signed)
This patient is currently hospitalized. Cancelled Myoview scheduled for tomorrow. Will send to scheduling to reschedule accordingly. Patient currently has a follow up scheduled with Eula Listen on 09/02/20.

## 2020-08-23 NOTE — Discharge Summary (Signed)
Physician Discharge Summary  Timothy [name]. ATF:[id number] DOB: 1960/08/03 DOA: 08/11/2020  PCP: Timothy Reusing, NP  Admit date: 08/11/2020 Discharge date: 08/23/2020  Time spent: 37 minutes  Recommendations for Outpatient Follow-up:  1. Needs outpatient 2 view chest x-ray in about 1 to 2 weeks 2. Home health will be coming out to give him IV Rocephin will need labs as per their protocol 3. Please get follow-up Chem-12 CBC 4. ?  Restart Entresto as an outpatient after labs-he never picked up the prescription 5. No further Plavix-replacing with Eliquis 6. Continue smoking cessation counseling  Discharge Diagnoses:  [name] problem for hospitalization   Left-sided empyema with Streptococcus intermedius empyema Status post chest tube placement  Please see below for itemized issues addressed in HOpsital- refer to other progress notes for clarity if needed  Discharge Condition: Improved  Diet recommendation: Heart healthy diabetic  Filed Weights   08/11/20 2120  Weight: 93.3 kg    History of present illness:  60 year old previously home dwelling male HTN DM TY 2 with hyperglycemia HFpEF Peripheral vascular disease + DES lower extremity-osteomyelitis with amputation left great toe + group B strep 03/22/2020-Rx Augmentin PTSD plus antisocial disorder  Admit to [facility] left-sided chest pain, spasm + S OB with sleep and exertion + coughing spells blood-tinged sputum + Chills no fever fatigue  CT = large left pleural effusion with multiple loculations-WBC 13 sodium 126 procalcitonin 0.4  Transfer to [facility] 2/28 had VATS with left-sided decortication  Hospital Course:   Acute hypoxic respiratory failure 2/2 empyema-growing pansensitive Streptococcus intermedius Sepsis prior to arrival Appreciate CVTS input-CT scan shows small rind onlt therefore chest tube removed 3/7 Zosyn since 2/23-->ceftriaxone 3/5 to cover strep and will continue until 3/13 which is 3  weeks PICC line to be removed after treatment PVD + left great toe amputation + stent Imaging performed reveal DVT as below Holding long-term Plavix 75-Dr. Stanford Breed VVS on 3/6 recommended only Eliquis at this time Left soleal DVT             continue heparin at this time and transition to Eliquis on  discharge HTN             continue amlodipine 5, clonidine 0.1-blood pressures are  stable DM TY 2             CBG 100-140, eating 50% meals             All oral meds resumed on discharge  gabapentin 1 3 times  daily  mild hypervolemic hyponatremia probably from heart failure              not resuming Entresto 3/7 (had not picked it up from his  pharmacy previously)  Outpatient consideration for the same             -5 L since admission PTSD antisocial personality disorder insomnia             Continue Celexa 20 daily Left-sided abdominal pain  Procedures:  Chest tube placement Consultations:  CVTS  Discharge Exam: Vitals:   08/23/20 0700 08/23/20 1143  BP: 124/72 129/80  Pulse: 69 63  Resp: 16 17  Temp: 98.3 F (36.8 C) 98.1 F (36.7 C)  SpO2: 96% 94%    Subj on day of d/c   Awake alert no distress eating drinking no pain that is apparent at this time No nausea no vomiting Tolerating diet has been up and about  General Exam on discharge  EOMI NCAT no focal deficit CTA B no added sound no rales no rhonchi Chest clear Abdomen soft no rebound no guarding ROM intact Neurologically intact no focal deficit  Discharge Instructions   Discharge Instructions    [facility] pharmacist to adjust dose for Vancomycin, Aminoglycosides and other anti-infective therapies as requested by physician.   Complete by: As directed    [facility] to provide Cath Flo 566m   Complete by: As directed    Administer for PICC line occlusion and as ordered by physician for other access device issues.   Anaphylaxis Kit: Provided to treat any anaphylactic reaction to the  medication being provided to the patient if First Dose or when requested by physician   Complete by: As directed    Epinephrine 160mml vial / amp: Administer 0.66m31m0.66ml71mubcutaneously once for moderate to severe anaphylaxis, nurse to call physician and pharmacy when reaction occurs and call 911 if needed for immediate care   Diphenhydramine 50mg166mIV vial: Administer 25-50mg 35mM PRN for first dose reaction, rash, itching, mild reaction, nurse to call physician and pharmacy when reaction occurs   Sodium Chloride 0.9% NS 500ml I89mdminister if needed for hypovolemic blood pressure drop or as ordered by physician after call to physician with anaphylactic reaction   Change dressing on IV access line weekly and PRN   Complete by: As directed    Diet - low sodium heart healthy   Complete by: As directed    Discharge instructions   Complete by: As directed    You were diagnosed with a lung infection that needed a tube in your chest to drain it You will need an IV in addition to gettin IV antibiotics for the next several weeks Please ensure that you get follow-up for this and follow-up with your primary care physician If you have significant fever chills or other issues in the outpatient setting you will need to be seen by primary care   Flush IV access with Sodium Chloride 0.9% and Heparin 10 units/ml or 100 units/ml   Complete by: As directed    Home infusion instructions - [facility]   Complete by: As directed    Instructions: Flush IV access with Sodium Chloride 0.9% and Heparin 10units/ml or 100units/ml   Change dressing on IV access line: Weekly and PRN   Instructions Cath Flo 2mg: Ad766mister for PICC Line occlusion and as ordered by physician for other access device   [facility] pharmacist to adjust dose for: Vancomycin, Aminoglycosides and other anti-infective therapies as requested by physician   Increase activity slowly   Complete by: As directed    Leave  dressing on - Keep it clean, dry, and intact until clinic visit   Complete by: As directed    Method of administration may be changed at the discretion of home infusion pharmacist based upon assessment of the patient and/or caregiver's ability to self-administer the medication ordered   Complete by: As directed      Allergies as of 08/23/2020      Reactions   Bee Venom Anaphylaxis   Coffea Arabica Shortness Of Breath   Coffee Flavor Shortness Of Breath   Cheese Swelling   Blue Cheese only      Medication List    STOP taking these medications   esomeprazole 20 MG capsule Commonly known as: NEXIUM Replaced by: pantoprazole 40 MG tablet   sacubitril-valsartan 24-26 MG Commonly known as: ENTRESTO     TAKE these  medications   albuterol 108 (90 Base) MCG/ACT inhaler Commonly known as: VENTOLIN HFA Inhale 1 puff into the lungs every 6 (six) hours as needed for wheezing or shortness of breath.   amLODipine 5 MG tablet Commonly known as: NORVASC Take 1 tablet (5 mg total) by mouth daily.   ARTHRITIS PAIN PO Take 1,000 mg by mouth daily.   blood glucose meter kit and supplies Kit Dispense based on patient and insurance preference. Use up to four times daily as directed. (FOR ICD-9 250.00, 250.01).   cefTRIAXone  IVPB Commonly known as: ROCEPHIN Inject 2 g into the vein daily for 7 days. Indication:  empyema First Dose: No Last Day of Therapy:  08/30/20 Labs - Once weekly:  CBC/D and BMP, Labs - Every other week:  ESR and CRP Method of administration: IV Push Method of administration may be changed at the discretion of home infusion pharmacist based upon assessment of the patient and/or caregiver's ability to self-administer the medication ordered.   citalopram 20 MG tablet Commonly known as: CELEXA TAKE ONE TABLET BY MOUTH EVERY DAY   cloNIDine 0.1 MG tablet Commonly known as: CATAPRES TAKE ONE TABLET BY MOUTH AT BEDTIME   clopidogrel 75 MG tablet Commonly known as:  PLAVIX Take 1 tablet (75 mg total) by mouth daily with breakfast.   gabapentin 300 MG capsule Commonly known as: NEURONTIN Take 1 capsule (300 mg total) by mouth 3 (three) times daily.   glipiZIDE 5 MG tablet Commonly known as: GLUCOTROL Take 10 mg in the morning and 5 mg in the evening What changed:   how much to take  how to take this  when to take this   Magnesium 400 MG Caps Take 400 mg by mouth daily.   metFORMIN 1000 MG tablet Commonly known as: Glucophage Take 1 tablet (1,000 mg total) by mouth 2 (two) times daily with a meal.   nicotine 21 mg/24hr patch Commonly known as: NICODERM CQ - dosed in mg/24 hours Place 1 patch (21 mg total) onto the skin daily. Start taking on: August 24, 2020   pantoprazole 40 MG tablet Commonly known as: PROTONIX Take 1 tablet (40 mg total) by mouth daily. Start taking on: August 24, 2020 Replaces: esomeprazole 20 MG capsule   rosuvastatin 5 MG tablet Commonly known as: CRESTOR Take 1 tablet (5 mg total) by mouth daily.   traMADol 50 MG tablet Commonly known as: ULTRAM Take 1-2 tablets (50-100 mg total) by mouth every 6 (six) hours as needed for up to 5 days (mild pain).   triamcinolone 0.025 % ointment Commonly known as: KENALOG Apply 1 application topically 2 (two) times daily. To buttocks            Discharge Care Instructions  (From admission, onward)         Start     Ordered   08/23/20 0000  Change dressing on IV access line weekly and PRN  (Home infusion instructions - [facility] )        08/23/20 1410   08/23/20 0000  Leave dressing on - Keep it clean, dry, and intact until clinic visit        08/23/20 1410         Allergies  Allergen Reactions  . Bee Venom Anaphylaxis  . Coffea Arabica Shortness Of Breath  . Coffee Flavor Shortness Of Breath  . Cheese Swelling    Blue Cheese only    Follow-up Information    [name],  MD Follow up.   Specialty: Cardiothoracic Surgery Why:  Please see discharge paperwork for follow-up appointment with Dr. Kipp Brood.  Also obtain a chest x-ray at [facility] 1/2-hour prior to appointment.  It is located on the first floor of the same office complex. Contact information: [place] [address] [place] Follow up.   Why: [facility]               The results of significant diagnostics from this hospitalization (including imaging, microbiology, ancillary and laboratory) are listed below for reference.    Significant Diagnostic Studies: CT ABDOMEN PELVIS W WO CONTRAST  Result Date: 08/12/2020 CLINICAL DATA:  LEFT lower quadrant abdominal pain in a 60 year old male EXAM: CT ABDOMEN AND PELVIS WITHOUT AND WITH CONTRAST TECHNIQUE: Multidetector CT imaging of the abdomen and pelvis was performed following the standard protocol before and following the bolus administration of intravenous contrast. CONTRAST:  118m OMNIPAQUE IOHEXOL 300 MG/ML  SOLN COMPARISON:  CT angiography of the chest from August 10, 2020 FINDINGS: Lower chest: Signs of suspected empyema and LEFT lower lobe airspace disease better demonstrated on preceding CT of the chest. Hepatobiliary: No focal, suspicious hepatic lesion. The portal vein is patent. No pericholecystic stranding. Mildly limited assessment due to respiratory motion of all upper abdominal viscera. Sludge and/or small stones in the dependent gallbladder. Pancreas: Normal, without mass, inflammation or ductal dilatation. Spleen: Normal size spleen without focal lesion. Adrenals/Urinary Tract: Adrenal glands are normal. Symmetric renal enhancement. No hydronephrosis. Mild renal cortical scarring on the RIGHT. Mild perinephric stranding, slightly asymmetric along the upper pole the RIGHT kidney as compared to the LEFT. No visible lesion. Stomach/Bowel: No acute gastrointestinal process. Colonic diverticulosis of the  sigmoid colon. Appendix not visualized though there are no secondary signs to suggest acute appendicitis. Vascular/Lymphatic: Calcified noncalcified atheromatous plaque in the abdominal aorta without aortic aneurysm. 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 There is no gastrohepatic or hepatoduodenal ligament lymphadenopathy. No retroperitoneal or mesenteric lymphadenopathy. No pelvic sidewall lymphadenopathy. Reproductive: Prostate and urinary bladder are grossly normal on CT. No perivesical stranding. Other: No free air.  No ascites. Musculoskeletal: Spinal degenerative changes. No acute or destructive bone process. IMPRESSION: 1. Signs of suspected empyema and LEFT lower lobe airspace disease better demonstrated on prior CT of the chest. 2. Mild perinephric stranding slightly greater on the RIGHT than the LEFT. Correlate with urinalysis. No classic findings otherwise of pyelonephritis on the current study. 3. 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 4. Colonic diverticulosis without evidence of acute gastrointestinal process. 5. Aortic atherosclerosis. Aortic Atherosclerosis (ICD10-I70.0). Electronically Signed   By: GZetta BillsM.D.   On: 08/12/2020 08:09   DG Chest 2 View  Result Date: 08/23/2020 CLINICAL DATA:  Chest tube removal EXAM: CHEST - 2 VIEW COMPARISON:  08/21/2020 FINDINGS: Left large bore chest tube has been removed. Small laterally loculated left pleural effusion persists, unchanged. Opacity at the left lung base likely relates to pleural thickening within the fissure best noted on CT examination of  08/21/2020. There is left-sided volume loss again noted. No pneumothorax. Right lung is clear. No pneumothorax or pleural effusion on the right. Cardiac size within normal limits. IMPRESSION: Interval left chest tube removal.  No pneumothorax. Stable laterally loculated left pleural effusion, left basilar opacity secondary to pleural thickening, and left-sided volume loss, all better appreciated on CT examination of 08/21/2020. Electronically Signed   By: Fidela Salisbury MD   On: 08/23/2020 06:46   DG Chest 2 View  Result Date: 08/20/2020 CLINICAL DATA:  Follow-up surgery EXAM: CHEST - 2 VIEW COMPARISON:  August 19, 2020 FINDINGS: A left chest tube terminates in the left apex, unchanged. No pneumothorax identified. Stable left effusion and underlying opacity. The cardiomediastinal silhouette is stable. The right lung remains clear. No other acute interval changes. IMPRESSION: 1. Stable left chest tube without pneumothorax. 2. Stable effusion and opacity in the left mid lower lung. Electronically Signed   By: Dorise Bullion III M.D   On: 08/20/2020 08:16   CT CHEST WO CONTRAST  Result Date: 08/21/2020 CLINICAL DATA:  Empyema follow-up. EXAM: CT CHEST WITHOUT CONTRAST TECHNIQUE: Multidetector CT imaging of the chest was performed following the standard protocol without IV contrast. COMPARISON:  CT scan August 10, 2020 FINDINGS: Cardiovascular: Mild calcified atherosclerosis in the nonaneurysmal aorta. The central pulmonary arteries are normal in caliber. Coronary artery disease in the left coronary arteries. The heart is unchanged. Mediastinum/Nodes: The chest wall is normal. There is a tiny right pleural effusion which is new. No pericardial effusion. There is a new left chest tube in good position. The amount of pleural fluid has significantly decreased. However, a small to moderate amount of pleural fluid remains peripherally. Much of this fluid is likely loculated rather than free-flowing. A small amount of  air within the fluid may be due to the chest tube. Lungs/Pleura: Central airways are normal. No pneumothorax on the right. Mild opacity is seen in the posterior right upper lobe on series 6, image 64. Similar opacity is seen laterally in the right upper lobe on series 6, image 78. No other suspicious infiltrates identified on the right. There is fluid and air in the left pleural space. At least some of the air is likely due to the chest tube in place. There is atelectasis associated with the left-sided effusion. There is a nodular region in the left apex abutting the mediastinum measuring up to 2.3 cm on coronal image 52. This finding is also seen on axial image 38 of series 6. No other nodules or masses. Upper Abdomen: No acute abnormality. Musculoskeletal: No chest wall mass or suspicious bone lesions identified. IMPRESSION: 1. The left-sided empyema is much smaller in the interval with less pleural fluid. However, there is still a rind of pleural fluid, most of which is not free-flowing and apparently loculated. The chest tube on the left, placed in the interval, courses through this fluid. There is associated atelectasis. 2. Air in the left pleural space is likely due to the chest tube. 3. Patchy opacities in the right upper lobe, likely infectious. Recommend attention on follow-up. 4. There is a 2.3 cm nodular region in the medial left upper lobe abutting the mediastinum as seen on coronal image 52. This could represent focal pleural thickening or atelectasis given recent pleural fluid in this region. A neoplasm cannot be excluded on this study alone. Recommend short-term follow-up imaging to ensure resolution. 5. Calcified atherosclerosis in the nonaneurysmal aorta. 6. Coronary artery calcifications. 7. New small right pleural effusion. Aortic  Atherosclerosis (ICD10-I70.0). Electronically Signed   By: Dorise Bullion III M.D   On: 08/21/2020 12:36   CT Angio Chest PE W and/or Wo Contrast  Result Date:  08/10/2020 CLINICAL DATA:  Positive D-dimer. Poor oxygen saturation. Abnormal chest radiography. EXAM: CT ANGIOGRAPHY CHEST WITH CONTRAST TECHNIQUE: Multidetector CT imaging of the chest was performed using the standard protocol during bolus administration of intravenous contrast. Multiplanar CT image reconstructions and MIPs were obtained to evaluate the vascular anatomy. CONTRAST:  121m OMNIPAQUE IOHEXOL 350 MG/ML SOLN COMPARISON:  Chest radiography same day.  CT 11/03/2019 FINDINGS: Cardiovascular: Heart size is normal. No pericardial fluid. There is some coronary artery calcification. There is aortic atherosclerotic calcification. Pulmonary arterial opacification is good. There are no pulmonary emboli. Mediastinum/Nodes: Mild reactive nodal prominence in the mediastinum. No dominant node. Lungs/Pleura: Right chest is clear. No infiltrate, effusion or collapse. On the left, there is a large amount of pleural fluid with multiple loculations worrisome for empyema. There is considerable volume loss in the adjacent lung, particularly affecting the left lower lobe. Upper Abdomen: No significant upper abdominal finding. Musculoskeletal: Ordinary mild thoracic degenerative changes. Review of the MIP images confirms the above findings. IMPRESSION: 1. No pulmonary emboli. 2. Large amount of pleural fluid on the left with multiple loculations worrisome for empyema. Volume loss in the adjacent lung, particularly affecting the left lower lobe. 3. Aortic atherosclerosis. Coronary artery calcification. Aortic Atherosclerosis (ICD10-I70.0). Electronically Signed   By: MNelson ChimesM.D.   On: 08/10/2020 20:18   DG CHEST PORT 1 VIEW  Result Date: 08/21/2020 CLINICAL DATA:  Empyema.  History of CHF. EXAM: PORTABLE CHEST 1 VIEW COMPARISON:  August 20, 2020 FINDINGS: The left chest tube is in good position. No pneumothorax. The left-sided effusion and underlying opacity is stable. The cardiomediastinal silhouette is stable. No  other interval changes. IMPRESSION: 1. The left chest tube remains in place without pneumothorax. 2. Effusion and opacity in the left mid lower lung is stable. No other changes. Electronically Signed   By: DDorise BullionIII M.D   On: 08/21/2020 07:42   DG CHEST PORT 1 VIEW  Result Date: 08/19/2020 CLINICAL DATA:  Empyema EXAM: PORTABLE CHEST 1 VIEW COMPARISON:  08/18/2020 FINDINGS: Pulmonary insufflation is stable with mild left-sided volume loss. Large bore left chest tube is unchanged. Small lateral and basilar loculated left pleural effusion is again seen and appears slightly enlarged since prior examination. Superimposed infiltrate within the left mid and lower lung zone persists. Right lung is clear. No pneumothorax. No pleural effusion on the right. Cardiac size within normal limits. IMPRESSION: Slight interval increase in size in a loculated lateral and basilar left pleural effusion. Stable associated left basilar pulmonary infiltrate. Left chest tube in place.  No pneumothorax. Electronically Signed   By: AFidela SalisburyMD   On: 08/19/2020 07:03   DG CHEST PORT 1 VIEW  Result Date: 08/18/2020 CLINICAL DATA:  Status post lung surgery. EXAM: PORTABLE CHEST 1 VIEW COMPARISON:  August 17, 2020. FINDINGS: Stable cardiomegaly. Left-sided chest tube is noted without pneumothorax. Stable small loculated left pleural effusion is noted with associated left basilar atelectasis. Right lung is unremarkable. Bony thorax is unremarkable. IMPRESSION: Left-sided chest tube is noted without pneumothorax. Stable small loculated left pleural effusion is noted with associated left basilar atelectasis. Electronically Signed   By: JMarijo ConceptionM.D.   On: 08/18/2020 08:41   DG CHEST PORT 1 VIEW  Result Date: 08/17/2020 CLINICAL DATA:  Chest tube, postop EXAM: PORTABLE  CHEST 1 VIEW COMPARISON:  Portable exam 0808 hours compared to 08/16/2020 FINDINGS: LEFT thoracostomy tube unchanged. Enlargement of cardiac silhouette.  Stable mediastinal contours and pulmonary vascularity. Persistent effusion at lower lateral LEFT hemithorax with atelectasis versus consolidation in lower LEFT lung. Remaining lungs clear. No pneumothorax. IMPRESSION: Persistent atelectasis versus consolidation in lower LEFT lung with LEFT pleural effusion. Electronically Signed   By: Lavonia Dana M.D.   On: 08/17/2020 08:20   DG CHEST PORT 1 VIEW  Result Date: 08/16/2020 CLINICAL DATA:  Empyema. EXAM: PORTABLE CHEST 1 VIEW COMPARISON:  August 15, 2020. FINDINGS: Stable cardiomegaly. Left-sided chest tube is unchanged in position. No pneumothorax is noted. Stable residual left pleural effusion is noted with associated atelectasis or infiltrate. Right lung is unremarkable. Bony thorax is unremarkable. IMPRESSION: Left-sided chest tube is unchanged in position. Stable residual left pleural effusion is noted with associated atelectasis or infiltrate. Electronically Signed   By: Marijo Conception M.D.   On: 08/16/2020 08:24   DG Chest Port 1 View  Result Date: 08/15/2020 CLINICAL DATA:  Post VATS EXAM: PORTABLE CHEST 1 VIEW COMPARISON:  08/10/2020 FINDINGS: Left chest tube in place. No pneumothorax. Left pleural effusion and left mid and lower lung airspace disease, similar to prior study. No confluent opacity on the right. Mild cardiomegaly. IMPRESSION: Left chest tube in place without pneumothorax. Continued left effusion with left lung atelectasis or infiltrates. Electronically Signed   By: Rolm Baptise M.D.   On: 08/15/2020 16:30   DG Chest Portable 1 View  Result Date: 08/10/2020 CLINICAL DATA:  Short of breath.  Chest pain. EXAM: PORTABLE CHEST 1 VIEW COMPARISON:  03/15/2020 FINDINGS: There is a moderate to large left pleural effusion with significantly diminished aeration to the left lower and left mid lung. Veil like opacification of the left upper lung is also noted. Right lung appears clear. Visualized osseous structures are unremarkable. IMPRESSION:  Moderate to large left pleural effusion with significantly diminished aeration to the left lower and left mid lung. This is new when compared with the previous exam. Electronically Signed   By: Kerby Moors M.D.   On: 08/10/2020 19:58   VAS [place] ABI WITH/WO TBI  Result Date: 08/16/2020 LOWER EXTREMITY DOPPLER STUDY Indications: Claudication, and peripheral artery disease. High Risk Factors: Hypertension, hyperlipidemia, Diabetes, current smoker.  Vascular Interventions: SFA Stent. Comparison Study: Previous 2/22 Normal Performing Technologist: Vonzell Schlatter RVT  Examination Guidelines: A complete evaluation includes at minimum, Doppler waveform signals and systolic blood pressure reading at the level of bilateral brachial, anterior tibial, and posterior tibial arteries, when vessel segments are accessible. Bilateral testing is considered an integral part of a complete examination. Photoelectric Plethysmograph (PPG) waveforms and toe systolic pressure readings are included as required and additional duplex testing as needed. Limited examinations for reoccurring indications may be performed as noted.  ABI Findings: +--------+------------------+-----+---------+--------+ Right   Rt Pressure (mmHg)IndexWaveform Comment  +--------+------------------+-----+---------+--------+ [id number]                    triphasic         +--------+------------------+-----+---------+--------+ PTA     130               1.23 triphasic         +--------+------------------+-----+---------+--------+ DP      121               1.14 triphasic         +--------+------------------+-----+---------+--------+ +--------+------------------+-----+---------+------------+ Left    Lt Pressure (mmHg)IndexWaveform  Comment      +--------+------------------+-----+---------+------------+ Brachial                                Multiple IVs +--------+------------------+-----+---------+------------+ PTA     133                1.25 biphasic              +--------+------------------+-----+---------+------------+ DP      105               0.99 triphasic             +--------+------------------+-----+---------+------------+ +-------+-----------+-----------+------------+------------+ ABI/TBIToday's ABIToday's TBIPrevious ABIPrevious TBI +-------+-----------+-----------+------------+------------+ Right  1.23                  1.08                     +-------+-----------+-----------+------------+------------+ Left   1.25                  1.11                     +-------+-----------+-----------+------------+------------+ Right ABIs appear increased. Left ABIs appear increased.  Summary: Right: Resting right ankle-brachial index is within normal range. No evidence of significant right lower extremity arterial disease. Left: Resting left ankle-brachial index is within normal range. No evidence of significant left lower extremity arterial disease.  *See table(s) above for measurements and observations.  Electronically signed by Curt Jews MD on 08/16/2020 at 7:21:21 PM.    Final    VAS [place] LOWER EXTREMITY VENOUS (DVT)  Result Date: 08/16/2020  Lower Venous DVT Study Indications: Pain.  Risk Factors: None identified. Comparison Study: No previous venous exams Performing Technologist: Vonzell Schlatter RVT  Examination Guidelines: A complete evaluation includes B-mode imaging, spectral Doppler, color Doppler, and power Doppler as needed of all accessible portions of each vessel. Bilateral testing is considered an integral part of a complete examination. Limited examinations for reoccurring indications may be performed as noted. The reflux portion of the exam is performed with the patient in reverse Trendelenburg.  +---------+---------------+---------+-----------+----------+--------------+ LEFT     CompressibilityPhasicitySpontaneityPropertiesThrombus Aging  +---------+---------------+---------+-----------+----------+--------------+ CFV      Full           Yes      Yes                                 +---------+---------------+---------+-----------+----------+--------------+ SFJ      Full                                                        +---------+---------------+---------+-----------+----------+--------------+ FV Prox  Full                                                        +---------+---------------+---------+-----------+----------+--------------+ FV Mid   Full                                                        +---------+---------------+---------+-----------+----------+--------------+  FV DistalFull                                                        +---------+---------------+---------+-----------+----------+--------------+ PFV      Full                                                        +---------+---------------+---------+-----------+----------+--------------+ POP      Full           Yes      Yes                                 +---------+---------------+---------+-----------+----------+--------------+ PTV      Full                                                        +---------+---------------+---------+-----------+----------+--------------+ PERO     Full                                                        +---------+---------------+---------+-----------+----------+--------------+ Soleal   None                                         Acute          +---------+---------------+---------+-----------+----------+--------------+   Summary: LEFT: - Findings consistent with acute deep vein thrombosis involving the left soleal veins. - No cystic structure found in the popliteal fossa.  *See table(s) above for measurements and observations. Electronically signed by Curt Jews MD on 08/16/2020 at 7:21:06 PM.    Final    [place] EKG SITE RITE  Result Date: 08/23/2020 If Site Rite image  not attached, placement could not be confirmed due to current cardiac rhythm.   Microbiology: Recent Results (from the past 240 hour(s))  Surgical PCR screen     Status: None   Collection Time: 08/14/20 11:50 AM   Specimen: Nasal Mucosa; Nasal Swab  Result Value Ref Range Status   MRSA, PCR NEGATIVE NEGATIVE Final   Staphylococcus aureus NEGATIVE NEGATIVE Final    Comment: (NOTE) The Xpert SA Assay (FDA approved for NASAL specimens in patients 26 years of age and older), is one component of a comprehensive surveillance program. It is not intended to diagnose infection nor to guide or monitor treatment. Performed at [facility], [place] [address] [place], [place] [address]   Acid Fast Smear (AFB)     Status: None   Collection Time: 08/15/20  1:48 PM   Specimen: Other Source; Body Fluid  Result Value Ref Range Status   AFB Specimen Processing Concentration  Final   Acid Fast Smear Negative  Final    Comment: (NOTE) Performed  At: [facility] [place], [place] [id number] Rush Farmer MD [id number]    Source (AFB) FLUID  Final    Comment: PLEURAL RIGHT Performed at [facility], [place] [address] [place], [place] [address]   Body fluid culture w Gram Stain     Status: None   Collection Time: 08/15/20  1:48 PM   Specimen: Body Fluid  Result Value Ref Range Status   Specimen Description FLUID PLEURAL LEFT  Final   Special Requests NONE  Final   Gram Stain   Final    ABUNDANT WBC PRESENT,BOTH PMN AND MONONUCLEAR MODERATE GRAM POSITIVE COCCI Performed at [facility], [place] [address] [place], [place] [address]    Culture MODERATE STREPTOCOCCUS INTERMEDIUS  Final   Report Status 08/20/2020 FINAL  Final   Organism ID, Bacteria STREPTOCOCCUS INTERMEDIUS  Final      Susceptibility   Streptococcus intermedius - MIC*    TETRACYCLINE <=0.25 SENSITIVE Sensitive     VANCOMYCIN 0.25 SENSITIVE Sensitive     CLINDAMYCIN <=0.25 SENSITIVE  Sensitive     PENICILLIN Value in next row Sensitive      SENSITIVE<=0.06    CEFTRIAXONE Value in next row Sensitive      SENSITIVE<=0.12    * MODERATE STREPTOCOCCUS INTERMEDIUS  Anaerobic culture w Gram Stain     Status: None   Collection Time: 08/15/20  1:48 PM   Specimen: Fluid  Result Value Ref Range Status   Specimen Description FLUID PLEURAL RIGHT  Final   Special Requests NONE  Final   Culture   Final    NO ANAEROBES ISOLATED Performed at [facility], [address] [place], [place] [address]    Report Status 08/20/2020 FINAL  Final     Labs: Basic Metabolic Panel: Recent Labs  Lab 08/18/20 0312 08/19/20 0145 08/21/20 0158 08/22/20 0100 08/23/20 0026  NA 133* 132* 132* 131* 131*  K 4.3 4.4 4.3 4.1 4.3  CL 97* 97* 93* 94* 92*  CO2 _0 GLUCOSE 197* 131* 132* 183* 168*  BUN 9 6 5* 6 6  CREATININE 1.17 0.89 0.90 0.94 0.86  CALCIUM 7.9* 8.5* 8.7* 8.5* 9.0   Liver Function Tests: Recent Labs  Lab 08/17/20 0105 08/18/20 0312 08/21/20 0158 08/22/20 0100 08/23/20 0026  AST 12* 12* 14* 14* 14*  ALT _1 ALKPHOS 115 143* 194* 165* 151*  BILITOT 0.5 0.7 0.7 0.3 0.5  PROT 5.6* 5.8* 6.9 6.7 7.4  ALBUMIN 1.6* 1.7* 2.0* 2.0* 2.1*   No results for input(s): LIPASE, AMYLASE in the last 168 hours. No results for input(s): AMMONIA in the last 168 hours. CBC: Recent Labs  Lab 08/19/20 0145 08/20/20 0502 08/21/20 0158 08/22/20 0100 08/23/20 0026  WBC 8.5 8.0 9.8 7.9 7.0  HGB 9.2* 10.9* 10.4* 9.6* 10.8*  HCT 29.6* 33.1* 31.5* 30.1* 33.6*  MCV 99.7 95.7 95.7 97.1 96.8  PLT 604* 668* 646* 555* 586*   Cardiac Enzymes: No results for input(s): CKTOTAL, CKMB, CKMBINDEX, TROPONINI in the last 168 hours. BNP: BNP (last 3 results) Recent Labs    08/10/20 1842  BNP 222.7*    ProBNP (last 3 results) No results for input(s): PROBNP in the last 8760 hours.  CBG: Recent Labs  Lab 08/22/20 1124 08/22/20 1623 08/22/20 2114  08/23/20 0618 08/23/20 1142  GLUCAP 110* 106* 181* 146* 123*       Signed:  Nita Sells MD  [facility] 08/23/2020, 2:11 PM

## 2020-08-23 NOTE — Progress Notes (Signed)
Patient attempted to walk for 6 minutes in hall on room air, walked only 15 feet before desatting to 86%---2L o2 applied and only recovered to 89%, 3L o2 started and patient sats went to 92% while ambulating---at rest patient is 97% on 3L o2---able to walk full 6 min with 3L o2 and sats at 92%

## 2020-08-23 NOTE — TOC Progression Note (Addendum)
Transition of Care (TOC) - Progression Note    Patient Details  Name: Timothy [name]. MRN: [id number] Date of Birth: 1961-06-09  Transition of Care [facility]) CM/SW Contact  Leone Haven, RN Phone Number: 08/23/2020, 3:11 PM  Clinical Narrative:    Patient is for dc today, will need  [facility] for iv abx, referral made to Amy with [facility], also referral made to Jeri Modena with [facility].  NCM made referral to [facility] with [facility] for chairty home oxygen also.  Patient has picc placed today. Pam will let this NCM know if it will be possible to dc patient today. Also waiting to hear back from Amy with [facility] to see if they can take referral and have soc for tomorrow. MD will send script for eliquis to [facility] pharmacy to fill first 30 day free.  [facility], made follow up apt for 3/23 at 10 am.  Also will fax dc summary to Dr. Tonia Ghent at [phone] so that she can give a script to Medication Management (to [place]) to do patient assistance for eliquis for patient.         Expected Discharge Plan and Services           Expected Discharge Date: 08/23/20                                     Social Determinants of Health (SDOH) Interventions    Readmission Risk Interventions No flowsheet data found.

## 2020-08-23 NOTE — Progress Notes (Signed)
PHARMACY CONSULT NOTE FOR:  OUTPATIENT  PARENTERAL ANTIBIOTIC THERAPY (OPAT)  Indication: empyema Regimen: Ceftriaxone 2g IV q24h End date: 09/08/20  IV antibiotic discharge orders are pended. To discharging provider:  please sign these orders via discharge navigator,  Select New Orders & click on the button choice - Manage This Unsigned Work.     Thank you for allowing pharmacy to be a part of this patient's care.   Fredonia Highland, PharmD, BCPS, [facility] Clinical Pharmacist [phone] Please check AMION for all [facility] numbers 08/23/2020

## 2020-08-23 NOTE — Progress Notes (Addendum)
      [address]       [place] [address]             [phone]      8 Days Post-Op Procedure(s) (LRB): VIDEO ASSISTED THORACOSCOPY (VATS)/DECORTICATION (Left) Subjective: Feels okay. Appears worried this morning about going home.   Objective: Vital signs in last 24 hours: Temp:  [97.7 F (36.5 C)-98.3 F (36.8 C)] 97.7 F (36.5 C) (03/08 0300) Pulse Rate:  [66-69] 67 (03/08 0300) Cardiac Rhythm: Normal sinus rhythm (03/07 1912) Resp:  [14-19] 17 (03/08 0300) BP: (106-131)/(72-76) 129/72 (03/08 0300) SpO2:  [93 %-96 %] 93 % (03/08 0300)    Intake/Output from previous day: 03/07 0701 - 03/08 0700 In: 1639.6 [P.O.:480; I.V.:959.6; IV Piggyback:200] Out: 1450 [Urine:1450] Intake/Output this shift: No intake/output data recorded.  General appearance: alert, cooperative and no distress Heart: regular rate and rhythm, S1, S2 normal, no murmur, click, rub or gallop Lungs: clear to auscultation bilaterally Abdomen: soft, non-tender; bowel sounds normal; no masses,  no organomegaly Extremities: extremities normal, atraumatic, no cyanosis or edema Wound: clean and dry  Lab Results: Recent Labs    08/22/20 0100 08/23/20 0026  WBC 7.9 7.0  HGB 9.6* 10.8*  HCT 30.1* 33.6*  PLT 555* 586*   BMET:  Recent Labs    08/22/20 0100 08/23/20 0026  NA 131* 131*  K 4.1 4.3  CL 94* 92*  CO2 24 29  GLUCOSE 183* 168*  BUN 6 6  CREATININE 0.94 0.86  CALCIUM 8.5* 9.0    PT/INR: No results for input(s): LABPROT, INR in the last 72 hours. ABG    Component Value Date/Time   PHART 7.351 08/15/2020 1545   HCO3 27.8 08/15/2020 1545   TCO2 31 08/15/2020 1446   O2SAT 88.2 08/15/2020 1545   CBG (last 3)  Recent Labs    08/22/20 1623 08/22/20 2114 08/23/20 0618  GLUCAP 106* 181* 146*    Assessment/Plan: S/P Procedure(s) (LRB): VIDEO ASSISTED THORACOSCOPY (VATS)/DECORTICATION (Left)  1. Chest tube removed yesterday without issue. No pneumo on CXR,  left mid effusion and opacity is stable.  2. NSR in the 60s, BP well controlled. Continue Norvasc 3. Continue Rocephin for empyema, we recommend for a total of 3 weeks. 4. On IV heparin for DVT, pharm following 5. Oxygen saturation has been good on 2L.   Plan: Should be okay to resume DOAC on discharge. He will need home health services since he lives alone. Follow-up appointment in the chart.     LOS: 12 days    [name] 08/23/2020   Agree with above Can follow up in 1 week following discharge  Cherylynn Liszewski O Vaniah Chambers

## 2020-08-23 NOTE — TOC Transition Note (Signed)
Transition of Care [facility]) - CM/SW Discharge Note   Patient Details  Name: Timothy [name]. MRN: [id number] Date of Birth: 1960/08/20  Transition [facility] Care [facility]) CM/SW Contact:  Leone Haven, RN Phone Number: 08/23/2020, 6:17 PM   Clinical Narrative:    Patient will dc in the am after Pam does the iv abx teaching with him.  [facility] states they will take referral for charity for [facility].  The oxygen has been delivered to patient's room.  TOC has brought the eliquis 30 day free to patient room also.  NCM faxed the dc summary to patient's PCP so that they can start on patient asst with eliquis thru medication management at the [facility]. Patient will need transportation assistance at dc with [facility] transportation.    Final next level of care: Home w Home Health Services Barriers to Discharge:  (iv abx teaching in am)   Patient Goals and CMS Choice Patient states their goals for this hospitalization and ongoing recovery are:: go home CMS Medicare.gov Compare Post Acute Care list provided to:: Patient Choice offered to / list presented to : Patient  Discharge Placement                       Discharge Plan and Services                DME Arranged: Oxygen DME Agency: [facility] Date DME Agency Contacted: 08/23/20 Time DME Agency Contacted: 1500 Representative spoke with at DME Agency: Ian Malkin HH Arranged: RN,IV Antibiotics HH Agency: [facility] Date [facility] Agency Contacted: 08/23/20 Time HH Agency Contacted: 1545 Representative spoke with at [facility] Agency: Amy/Pam  Social Determinants of Health (SDOH) Interventions     Readmission Risk Interventions No flowsheet data found.

## 2020-08-24 ENCOUNTER — Other Ambulatory Visit: Payer: Self-pay

## 2020-08-24 LAB — GLUCOSE, CAPILLARY
Glucose-Capillary: 153 mg/dL — ABNORMAL HIGH (ref 70–99)
Glucose-Capillary: 154 mg/dL — ABNORMAL HIGH (ref 70–99)
Glucose-Capillary: 98 mg/dL (ref 70–99)

## 2020-08-24 MED ORDER — APIXABAN 5 MG PO TABS: 5.0000 mg | ORAL_TABLET | Freq: Two times a day (BID) | ORAL | Status: DC

## 2020-08-24 MED ORDER — APIXABAN 5 MG PO TABS
10.0000 mg | ORAL_TABLET | Freq: Two times a day (BID) | ORAL | Status: DC
  Filled 2020-08-24: qty 2

## 2020-08-24 MED ORDER — APIXABAN 5 MG PO TABS: ORAL_TABLET | ORAL | 3 refills | Status: DC

## 2020-08-24 MED ORDER — APIXABAN 5 MG PO TABS
10.0000 mg | ORAL_TABLET | Freq: Two times a day (BID) | ORAL | Status: DC
  Administered 2020-08-24: 10 mg via ORAL

## 2020-08-24 NOTE — Discharge Instructions (Signed)
Thoracoscopy, Care After This sheet gives you information about how to care for yourself after your procedure. Your health care provider may also give you more specific instructions. If you have problems or questions, contact your health care provider. What can I expect after the procedure? After the procedure, it is common to have pain and soreness in the surgical area. Follow these instructions at home: Incision care  Follow instructions from your health care provider about how to take care of your incision. Make sure you: ? Wash your hands with soap and water before you change your bandage (dressing). If soap and water are not available, use hand sanitizer. ? Change your dressing as told by your health care provider. ? Leave stitches (sutures), skin glue, or adhesive strips in place. These skin closures may need to stay in place for 2 weeks or longer. If adhesive strip edges start to loosen and curl up, you may trim the loose edges. Do not remove adhesive strips completely unless your health care provider tells you to do that.  Check your incision areas every day for signs of infection. Check for: ? Redness, swelling, or pain. ? Fluid or blood. ? Warmth. ? Pus or a bad smell.  Do not take baths, swim, or use a hot tub until your health care provider approves. You may take showers.   Medicines  Take over-the-counter and prescription medicines only as told by your health care provider.  If you were prescribed an antibiotic medicine, take it as told by your health care provider. Do not stop taking the antibiotic even if you start to feel better.  Do not drive or use heavy machinery while taking prescription pain medicine.  If you are taking prescription pain medicine, take actions to prevent or treat constipation. Your health care provider may recommend that you: ? Drink enough fluid to keep your urine pale yellow. ? Eat foods that are high in fiber, such as fresh fruits and vegetables,  whole grains, and beans. ? Limit foods that are high in fat and processed sugars, such as fried and sweet foods. ? Take an over-the-counter or prescription medicine for constipation. Managing pain, stiffness, and swelling  If directed, put ice on the affected area: ? Put ice in a plastic bag. ? Place a towel between your skin and the bag. ? Leave the ice on for 20 minutes, 2-3 times a day.   Preventing lung infection  To prevent pneumonia and to keep your lungs healthy: ? Try to cough often. If it hurts to cough, hold a pillow against your chest as you cough. ? Take deep breaths or do breathing exercises as instructed by your health care provider. ? If you were given an incentive spirometer, use it as directed by your health care provider. General instructions  Do not lift anything that is heavier than 10 lb (4.5 kg), or the limit that you are told, until your health care provider says that it is safe.  Do not use any products that contain nicotine or tobacco, such as cigarettes and e-cigarettes. These can delay healing after surgery. If you need help quitting, ask your health care provider.  Avoid driving until your health care provider approves.  If you have a chest drainage tube, care for it as instructed by your health care provider. Do not travel by airplane after the chest drainage tube is removed until your health care provider approves.  Keep all follow-up visits as told by your health care provider. This is   important. Contact a health care provider if:  You have a fever.  Pain medicines do not ease your pain.  You have redness, swelling, or increasing pain in your incision area.  You develop a cough that does not go away, or you are coughing up mucus that is yellow or green. Get help right away if:  You have fluid, blood, or pus coming from your incision.  There is a bad smell coming from your incision or dressing.  You develop a rash.  You cough up blood.  You  develop light-headedness, or you feel faint.  You have difficulty breathing.  You develop chest pain.  Your heartbeat feels irregular or very fast. These symptoms may represent a serious problem that is an emergency. Do not wait to see if the symptoms will go away. Get medical help right away. Call your local emergency services (911 in the [place] Do not drive yourself to the hospital. Summary  Follow instructions from your health care provider about how to take care of your incision.  Do not drive or use heavy machinery while taking prescription pain medicine.  Leave stitches (sutures), skin glue, or adhesive strips in place.  Check your incision areas every day for signs of infection. This information is not intended to replace advice given to you by your health care provider. Make sure you discuss any questions you have with your health care provider. Document Revised: 02/04/2020 Document Reviewed: 02/04/2020 Elsevier Patient Education  2021 Elsevier Inc.  Information on my medicine - ELIQUIS (apixaban) Why was Eliquis prescribed for you? Eliquis was prescribed to treat blood clots that may have been found in the veins of your legs (deep vein thrombosis) or in your lungs (pulmonary embolism) and to reduce the risk of them occurring again.  What do You need to know about Eliquis ? The starting dose is 10 mg (two 5 mg tablets) taken TWICE daily until 3/11  the dose is reduced to ONE 5 mg tablet taken TWICE daily.  Eliquis may be taken with or without food.   Try to take the dose about the same time in the morning and in the evening. If you have difficulty swallowing the tablet whole please discuss with your pharmacist how to take the medication safely.  Take Eliquis exactly as prescribed and DO NOT stop taking Eliquis without talking to the doctor who prescribed the medication.  Stopping may increase your risk of developing a new blood clot.  Refill your prescription before you  run out.  After discharge, you should have regular check-up appointments with your healthcare provider that is prescribing your Eliquis.    What do you do if you miss a dose? If a dose of ELIQUIS is not taken at the scheduled time, take it as soon as possible on the same day and twice-daily administration should be resumed. The dose should not be doubled to make up for a missed dose.  Important Safety Information A possible side effect of Eliquis is bleeding. You should call your healthcare provider right away if you experience any of the following: ? Bleeding from an injury or your nose that does not stop. ? Unusual colored urine (red or dark brown) or unusual colored stools (red or black). ? Unusual bruising for unknown reasons. ? A serious fall or if you hit your head (even if there is no bleeding).  Some medicines may interact with Eliquis and might increase your risk of bleeding or clotting while on Eliquis. To help avoid  this, consult your healthcare provider or pharmacist prior to using any new prescription or non-prescription medications, including herbals, vitamins, non-steroidal anti-inflammatory drugs (NSAIDs) and supplements.  This website has more information on Eliquis (apixaban): http://www.eliquis.com/eliquis/home

## 2020-08-24 NOTE — Progress Notes (Signed)
Plan unchanged since 3/9 Can discharge home Will need a cab ride home

## 2020-08-24 NOTE — Progress Notes (Signed)
ANTICOAGULATION CONSULT NOTE - Follow Up Consult  Pharmacy Consult for IV Heparin Indication: DVT  Allergies  Allergen Reactions  . Bee Venom Anaphylaxis  . Coffea Arabica Shortness Of Breath  . Coffee Flavor Shortness Of Breath  . Cheese Swelling    Blue Cheese only    Patient Measurements: Height: 5\' 10"  (177.8 cm) Weight: 93.3 kg (205 lb 11 oz) IBW/kg (Calculated) : 73 Heparin Dosing Weight: 93 kg  Vital Signs: Temp: 98.4 F (36.9 C) (03/09 0732) Temp Source: Oral (03/09 0732) BP: 120/67 (03/09 0732) Pulse Rate: 65 (03/09 0732)  Labs: Recent Labs    08/22/20 0100 08/23/20 0026  HGB 9.6* 10.8*  HCT 30.1* 33.6*  PLT 555* 586*  HEPARINUNFRC 0.43 0.44  CREATININE 0.94 0.86    Estimated Creatinine Clearance: 107.4 mL/min (by C-G formula based on SCr of 0.86 mg/dL).   Medical History: Past Medical History:  Diagnosis Date  . CHF (congestive heart failure) (HCC)   . Diabetes mellitus without complication (HCC)   . Hypercholesteremia   . Hypertension    Assessment: 60 yr old man admitted with empyema, S/P VATs 2/28, now found with acute L DVT. Pharmacy was consulted to dose IV heparin with no bolus; pt was on no anticoagulant PTA. Chest tubes remain in place.   Heparin level has been therapeutic at 0.4, CBC has been stable. No labs this am in anticipation of discharge after abx teaching. Apixaban script has been delivered by toc and case management is setting up patient assistance for outpatient follow up, patient educated and aware to follow up on assistance.   Goal of Therapy:  Heparin level 0.3-0.7 units/ml Monitor platelets by anticoagulation protocol: Yes   Plan:  Transition to apixaban this am Education provided to patient  3/28 PharmD., BCPS Clinical Pharmacist 08/24/2020 8:55 AM

## 2020-08-28 NOTE — Progress Notes (Deleted)
Cardiology Office Note    Date:  08/28/2020   ID:  Timothy Sill., DOB 1960-08-04, MRN [id number]  PCP:  Rolm Gala, NP  Cardiologist:  Debbe Odea, MD  Electrophysiologist:  None   Chief Complaint: Follow up Lexiscan MPI  History of Present Illness:   Timothy Fadden. is a 60 y.o. male with history of bacterial empyema in 07/2020, COPD with ongoing tobacco use of 40+ years, DM2, HTN, and HLD who presents for ***.  He has been followed by pulmonology for ongoing shortness of breath with underlying COPD and ongoing tobacco use.  Echo obtained by them in 05/2020 showed an EF of 60 to 65%, no regional wall motion abnormalities, normal LV diastolic function parameters, normal RV systolic function and ventricular cavity size, and a mildly dilated left atrium.  PFTs demonstrated moderate to severe COPD.  Prior lung cancer screening CT demonstrated coronary artery calcifications and in this setting he was referred to our office and evaluated by Dr. Azucena [name] on 08/08/2020 with a 81-month history of progressive shortness of breath sometimes associated with chest discomfort.  It was noted he had previously been started on Entresto by the [facility] clinic, though never started this medication.  BP at his visit with our office was soft at 98/64 with an O2 saturation of 87% on room air.  It was recommended Entresto be discontinued in the setting of soft BP and preserved LV systolic function with normal diastolic function noted on echo as above.  He was scheduled for [facility] MPI, though ultimately had to miss this appointment as he was admitted to the hospital for sepsis with acute hypoxic respiratory failure secondary to Streptococcus intermedius empyema status post VATS with left-sided decortication/chest tube placement.  Hospital admission was complicated by left soleal DVT with recommendation to hold long-term Plavix and treat with Eliquis.  During this admission  high-sensitivity troponin was normal x2.  BNP 222.  ***   Labs independently reviewed: 08/2020 - potassium 4.3, BUN 6, serum creatinine 0.86, albumin 2.1, AST 14, ALT normal, Hgb 10.8, PLT 586 06/2020 - A1c 7.7, TC 167, TG 335, HDL 44, LDL 70 10/2019 -TSH normal  Past Medical History:  Diagnosis Date  . CHF (congestive heart failure) (HCC)   . Diabetes mellitus without complication (HCC)   . Hypercholesteremia   . Hypertension     Past Surgical History:  Procedure Laterality Date  . AMPUTATION Left 03/22/2020   Procedure: AMPUTATION RAY-1st Ray Left Foot;  Surgeon: Linus Galas, [name];  Location: [facility] ORS;  Service: Podiatry;  Laterality: Left;  . AMPUTATION TOE Left 03/16/2020   Procedure: AMPUTATION GREAT TOE;  Surgeon: Linus Galas, [name];  Location: [facility] ORS;  Service: Podiatry;  Laterality: Left;  . COLONOSCOPY WITH PROPOFOL N/A 12/22/2019   Procedure: COLONOSCOPY WITH PROPOFOL;  Surgeon: Midge Minium, MD;  Location: [facility] ENDOSCOPY;  Service: Endoscopy;  Laterality: N/A;  . LOWER EXTREMITY ANGIOGRAPHY Left 03/18/2020   Procedure: Lower Extremity Angiography;  Surgeon: Renford Dills, MD;  Location: [facility] INVASIVE CV LAB;  Service: Cardiovascular;  Laterality: Left;  . TONSILLECTOMY    . VIDEO ASSISTED THORACOSCOPY (VATS)/DECORTICATION Left 08/15/2020   Procedure: VIDEO ASSISTED THORACOSCOPY (VATS)/DECORTICATION;  Surgeon: Corliss Skains, MD;  Location: [facility] OR;  Service: Thoracic;  Laterality: Left;  . WISDOM TOOTH EXTRACTION      Current Medications: No outpatient medications have been marked as taking for the 08/29/20 encounter (Appointment) with Sondra Barges, PA-C.    Allergies:  Bee venom, Coffea arabica, Coffee flavor, and Cheese   Social History   Socioeconomic History  . Marital status: Single    Spouse name: Not on file  . Number of children: 0  . Years of education: Not on file  . Highest education level: Not on file  Occupational History  . Occupation:  Unemployed  Tobacco Use  . Smoking status: Current Every Day Smoker    Packs/day: 2.00    Years: 41.00    Pack years: 82.00    Types: Cigarettes  . Smokeless tobacco: Never Used  . Tobacco comment: 1PPD 07/28/2020  Vaping Use  . Vaping Use: Never used  Substance and Sexual Activity  . Alcohol use: Yes    Alcohol/week: 6.0 standard drinks    Types: 6 Cans of beer per week    Comment: occasionally   . Drug use: Not Currently  . Sexual activity: Not Currently  Other Topics Concern  . Not on file  Social History Narrative  . Not on file   Social Determinants of Health   Financial Resource Strain: Medium Risk  . Difficulty of Paying Living Expenses: Somewhat hard  Food Insecurity: No Food Insecurity  . Worried About Programme researcher, broadcasting/film/video in the Last Year: Never true  . Ran Out of Food in the Last Year: Never true  Transportation Needs: No Transportation Needs  . Lack of Transportation (Medical): No  . Lack of Transportation (Non-Medical): No  Physical Activity: Inactive  . Days of Exercise per Week: 0 days  . Minutes of Exercise per Session: 0 min  Stress: Stress Concern Present  . Feeling of Stress : Rather much  Social Connections: Unknown  . Frequency of Communication with Friends and Family: Three times a week  . Frequency of Social Gatherings with Friends and Family: Never  . Attends Religious Services: Never  . Active Member of Clubs or Organizations: No  . Attends Banker Meetings: Never  . Marital Status: Not on file     Family History:  The patient's Family history is unknown by patient.  ROS:   ROS   EKGs/Labs/Other Studies Reviewed:    Studies reviewed were summarized above. The additional studies were reviewed today:  2D echo 05/2020: 1. Left ventricular ejection fraction, by estimation, is 60 to 65%. The  left ventricle has normal function. The left ventricle has no regional  wall motion abnormalities. Left ventricular diastolic  parameters were  normal.  2. Right ventricular systolic function is normal. The right ventricular  size is normal. Tricuspid regurgitation signal is inadequate for assessing  PA pressure.  3. Left atrial size was mildly dilated.  __________  Eugenie Birks MPI: Pending  EKG:  EKG is ordered today.  The EKG ordered today demonstrates ***  Recent Labs: 10/28/2019: TSH 1.720 08/10/2020: B Natriuretic Peptide 222.7 08/23/2020: ALT 13; BUN 6; Creatinine, Ser 0.86; Hemoglobin 10.8; Platelets 586; Potassium 4.3; Sodium 131  Recent Lipid Panel    Component Value Date/Time   CHOL 167 06/22/2020 1058   TRIG 335 (H) 06/22/2020 1058   HDL 44 06/22/2020 1058   CHOLHDL 3.8 06/22/2020 1058   LDLCALC 70 06/22/2020 1058    PHYSICAL EXAM:    VS:  There were no vitals taken for this visit.  BMI: There is no height or weight on file to calculate BMI.  Physical Exam  Wt Readings from Last 3 Encounters:  08/11/20 205 lb 11 oz (93.3 kg)  08/10/20 212 lb (96.2 kg)  08/08/20 208  lb (94.3 kg)     ASSESSMENT & PLAN:   1. Shortness of breath:  2. Coronary artery calcification:  3. HTN: Blood pressure ***  4. HLD: LDL 70 in 06/2020.  ***  5. History of empyema status post VATS with decortication/COPD/ongoing tobacco use: ***  Disposition: F/u with Dr. Azucena [name] or an APP in ***.   Medication Adjustments/Labs and Tests Ordered: Current medicines are reviewed at length with the patient today.  Concerns regarding medicines are outlined above. Medication changes, Labs and Tests ordered today are summarized above and listed in the Patient Instructions accessible in Encounters.   Signed, Timothy Listen, PA-C 08/28/2020 9:00 AM     [facility] [place] [address] [place], [place] [address] [phone]

## 2020-08-29 ENCOUNTER — Ambulatory Visit: Payer: Self-pay | Admitting: Physician Assistant

## 2020-08-30 ENCOUNTER — Encounter: Payer: Self-pay | Admitting: Physician Assistant

## 2020-08-31 ENCOUNTER — Encounter: Payer: Self-pay | Admitting: Family

## 2020-08-31 ENCOUNTER — Ambulatory Visit: Payer: Medicaid Other | Attending: Family | Admitting: Family

## 2020-08-31 ENCOUNTER — Other Ambulatory Visit: Payer: Self-pay

## 2020-08-31 VITALS — BP 110/75 | HR 89 | Resp 20 | Ht 70.0 in | Wt 205.0 lb

## 2020-08-31 DIAGNOSIS — Z7901 Long term (current) use of anticoagulants: Secondary | ICD-10-CM | POA: Diagnosis not present

## 2020-08-31 DIAGNOSIS — E119 Type 2 diabetes mellitus without complications: Secondary | ICD-10-CM | POA: Diagnosis not present

## 2020-08-31 DIAGNOSIS — Z7984 Long term (current) use of oral hypoglycemic drugs: Secondary | ICD-10-CM | POA: Diagnosis not present

## 2020-08-31 DIAGNOSIS — E785 Hyperlipidemia, unspecified: Secondary | ICD-10-CM | POA: Insufficient documentation

## 2020-08-31 DIAGNOSIS — Z792 Long term (current) use of antibiotics: Secondary | ICD-10-CM | POA: Insufficient documentation

## 2020-08-31 DIAGNOSIS — I5032 Chronic diastolic (congestive) heart failure: Secondary | ICD-10-CM | POA: Diagnosis not present

## 2020-08-31 DIAGNOSIS — Z79899 Other long term (current) drug therapy: Secondary | ICD-10-CM | POA: Diagnosis not present

## 2020-08-31 DIAGNOSIS — I110 Hypertensive heart disease with heart failure: Secondary | ICD-10-CM | POA: Insufficient documentation

## 2020-08-31 DIAGNOSIS — E1140 Type 2 diabetes mellitus with diabetic neuropathy, unspecified: Secondary | ICD-10-CM

## 2020-08-31 DIAGNOSIS — J869 Pyothorax without fistula: Secondary | ICD-10-CM | POA: Diagnosis not present

## 2020-08-31 DIAGNOSIS — I10 Essential (primary) hypertension: Secondary | ICD-10-CM

## 2020-08-31 DIAGNOSIS — Z89422 Acquired absence of other left toe(s): Secondary | ICD-10-CM | POA: Insufficient documentation

## 2020-08-31 DIAGNOSIS — F17210 Nicotine dependence, cigarettes, uncomplicated: Secondary | ICD-10-CM | POA: Insufficient documentation

## 2020-08-31 DIAGNOSIS — B955 Unspecified streptococcus as the cause of diseases classified elsewhere: Secondary | ICD-10-CM | POA: Insufficient documentation

## 2020-08-31 NOTE — Patient Instructions (Addendum)
Continue weighing daily and call for an overnight weight gain of > 2 pounds or a weekly weight gain of >5 pounds.   Call us in the future if you'd like to schedule another appointment 

## 2020-08-31 NOTE — Progress Notes (Signed)
Patient ID: Timothy Serano., male    DOB: 1961/01/05, 60 y.o.   MRN: [id number]  HPI  Timothy [name] is a 60 y/o male with a history of DM, hyperlipidemia, HTN, current tobacco use and chronic heart failure.   Echo report from 06/16/20 reviewed and showed an EF of 60-65% along with mild LAE.   Admitted 08/11/20 due to acute hypoxic respiratory failure 2/2 empyema-growing pansensitive Streptococcus intermedius. Chest tube placed with subsequent removal. Given IV antibiotics. Cardiothoracic surgery consult obtained. Imaging revealed left leg DVT, treated with heparin and then changed to eliquis. -5 L down from admission. Discharged after 13 days.   He presents today for a follow-up visit with a chief complaint of minimal shortness of breath upon moderate exertion. He describes this as chronic in nature having been present for several months and has improved since he's been placed on oxygen although he doesn't feel like the infection is completely gone yet. He has associated fatigue, productive cough, intermittent chest pain, palpitations, light-headedness, depression and anxiety along with this. He denies any difficulty sleeping, abdominal distention, pedal edema or weight gain.   Says that he has a couple more doses of antibiotics via his PICC line.   Past Medical History:  Diagnosis Date  . CHF (congestive heart failure) ([place])   . Diabetes mellitus without complication ([place])   . Hypercholesteremia   . Hypertension    Past Surgical History:  Procedure Laterality Date  . AMPUTATION Left 03/22/2020   Procedure: AMPUTATION RAY-1st Ray Left Foot;  Surgeon: [name], [name];  Location: [facility] ORS;  Service: Podiatry;  Laterality: Left;  . AMPUTATION TOE Left 03/16/2020   Procedure: AMPUTATION GREAT TOE;  Surgeon: [name], [name];  Location: [facility] ORS;  Service: Podiatry;  Laterality: Left;  . COLONOSCOPY WITH PROPOFOL N/A 12/22/2019   Procedure: COLONOSCOPY WITH PROPOFOL;  Surgeon: [name], MD;   Location: [facility] ENDOSCOPY;  Service: Endoscopy;  Laterality: N/A;  . LOWER EXTREMITY ANGIOGRAPHY Left 03/18/2020   Procedure: Lower Extremity Angiography;  Surgeon: [name], MD;  Location: [facility] CV LAB;  Service: Cardiovascular;  Laterality: Left;  . TONSILLECTOMY    . VIDEO ASSISTED THORACOSCOPY (VATS)/DECORTICATION Left 08/15/2020   Procedure: VIDEO ASSISTED THORACOSCOPY (VATS)/DECORTICATION;  Surgeon: [name], MD;  Location: [facility];  Service: Thoracic;  Laterality: Left;  . WISDOM TOOTH EXTRACTION     Family History  Family history unknown: Yes   Social History   Tobacco Use  . Smoking status: Current Every Day Smoker    Packs/day: 2.00    Years: 41.00    Pack years: 82.00    Types: Cigarettes  . Smokeless tobacco: Never Used  . Tobacco comment: 1PPD 07/28/2020  Substance Use Topics  . Alcohol use: Yes    Alcohol/week: 6.0 standard drinks    Types: 6 Cans of beer per week    Comment: occasionally    Allergies  Allergen Reactions  . Bee Venom Anaphylaxis  . Coffea Arabica Shortness Of Breath  . Coffee Flavor Shortness Of Breath  . Cheese Swelling    Blue Cheese only   Prior to Admission medications   Medication Sig Start Date End Date Taking? Authorizing Provider  Acetaminophen (ARTHRITIS PAIN PO) Take 1,000 mg by mouth daily.   Yes [provider]  albuterol (VENTOLIN HFA) 108 (90 Base) MCG/ACT inhaler Inhale 1 puff into the lungs every 6 (six) hours as needed for wheezing or shortness of breath. 05/26/20  Yes Timothy Pita,  MD  amLODipine (NORVASC) 5 MG tablet Take 1 tablet (5 mg total) by mouth daily. 08/08/20 11/06/20 Yes Timothy [name], Timothy Edelman, MD  apixaban (ELIQUIS) 5 MG TABS tablet Take 2 tablets (85m) twice daily for 2 days, then 1 tablet (530m twice daily starting 08/26/20 08/24/20  Yes Timothy [name]  blood glucose meter kit and supplies KIT Dispense based on patient and insurance preference. Use up to four times daily as  directed. (FOR ICD-9 250.00, 250.01). 10/15/19  Yes Timothy [name], Timothy [name], Timothy [name]  citalopram (CELEXA) 20 MG tablet TAKE ONE TABLET BY MOUTH EVERY DAY Patient taking differently: Take 20 mg by mouth daily. 07/13/20  Yes Timothy [name], Timothy [name], Timothy [name]  cloNIDine (CATAPRES) 0.1 MG tablet TAKE ONE TABLET BY MOUTH AT BEDTIME Patient taking differently: Take 0.1 mg by mouth at bedtime. 06/07/20  Yes Timothy [name], Timothy [name], Timothy [name]  gabapentin (NEURONTIN) 300 MG capsule Take 1 capsule (300 mg total) by mouth 3 (three) times daily. 06/29/20  Yes Timothy [name], Timothy [name], Timothy [name]  glipiZIDE (GLUCOTROL) 5 MG tablet Take 10 mg in the morning and 5 mg in the evening Patient taking differently: Take 5-10 mg by mouth See admin instructions. Take 10 mg in the morning and 5 mg in the evening 06/29/20  Yes Timothy [name], Timothy [name], Timothy [name]  Magnesium 400 MG CAPS Take 400 mg by mouth daily.   Yes [provider]  metFORMIN (GLUCOPHAGE) 1000 MG tablet Take 1 tablet (1,000 mg total) by mouth 2 (two) times daily with a meal. 06/29/20 06/29/21 Yes Timothy [name], Timothy [name], Timothy [name]  nicotine (NICODERM CQ - DOSED IN MG/24 HOURS) 21 mg/24hr patch Place 1 patch (21 mg total) onto the skin daily. 08/24/20  Yes Timothy [name]  pantoprazole (PROTONIX) 40 MG tablet Take 1 tablet (40 mg total) by mouth daily. 08/24/20  Yes Timothy [name]  rosuvastatin (CRESTOR) 5 MG tablet Take 1 tablet (5 mg total) by mouth daily. 06/29/20  Yes Timothy [name], Timothy [name], Timothy [name]  triamcinolone (KENALOG) 0.025 % ointment Apply 1 application topically 2 (two) times daily. To buttocks 05/25/20  Yes Timothy [name], Timothy [name]    Review of Systems  Constitutional: Positive for fatigue. Negative for appetite change.  HENT: Negative for congestion, postnasal drip and sore throat.   Eyes: Negative.   Respiratory: Positive for cough (productive) and shortness of breath (with moderate exertion).   Cardiovascular: Positive for chest pain and palpitations. Negative for leg swelling.   Gastrointestinal: Negative for abdominal distention and abdominal pain.  Endocrine: Negative.   Genitourinary: Negative.   Musculoskeletal: Negative for arthralgias and back pain.  Skin: Negative.   Allergic/Immunologic: Negative.   Neurological: Positive for light-headedness (with position changes). Negative for dizziness.  Hematological: Negative for adenopathy. Does not bruise/bleed easily.  Psychiatric/Behavioral: Positive for dysphoric mood. Negative for sleep disturbance. The patient is nervous/anxious.    Vitals:   08/31/20 1302  BP: 110/75  Pulse: 89  Resp: 20  SpO2: 100%  Weight: 205 lb (93 kg)  Height: '5\' 10"'  (1.778 m)   Wt Readings from Last 3 Encounters:  08/31/20 205 lb (93 kg)  08/11/20 205 lb 11 oz (93.3 kg)  08/10/20 212 lb (96.2 kg)   Lab Results  Component Value Date   CREATININE 0.86 08/23/2020   CREATININE 0.94 08/22/2020   CREATININE 0.90 08/21/2020   Physical Exam Vitals and nursing note reviewed.  Constitutional:      Appearance: He is well-developed.  HENT:     Head: Normocephalic and atraumatic.  Neck:  Vascular: No JVD.  Cardiovascular:     Rate and Rhythm: Normal rate and regular rhythm.  Pulmonary:     Effort: Pulmonary effort is normal. No respiratory distress.     Breath sounds: No wheezing or rales.  Abdominal:     Palpations: Abdomen is soft.     Tenderness: There is no abdominal tenderness.  Musculoskeletal:        General: No tenderness.     Cervical back: Neck supple.     Right lower leg: No tenderness. No edema.     Left lower leg: No tenderness. No edema.  Skin:    General: Skin is warm and dry.  Neurological:     General: No focal deficit present.     Mental Status: He is alert and oriented to person, place, and time.  Psychiatric:        Mood and Affect: Mood normal.        Behavior: Behavior normal.    Assessment & Plan:  1: Chronic heart failure with preserved ejection fraction along with structural changes  (LAE)- - NYHA class II - euvolemic today - weighing daily; reminded to call for an overnight weight gain of > 2 pounds or a weekly weight gain of > 5 pounds - weight unchanged from last visit here 1 month ago - trying to follow a low sodium diet; reviewed the importance of reading food labels for sodium content and looking at serving sizes - will check CMP/CBC per discharge summary - sees cardiology (Timothy [name]) 08/08/20; returns 09/08/20 - BNP 08/10/20 was 222.7  2: HTN- - BP looks good today - saw PCP @ [facility] Timothy [name]) 06/29/20; returns 09/07/20 - BMP 08/23/20 reviewed and showed sodium 131, potassium 4.3, creatinine 0.86 and GFR >60  3: DM- - saw vascular Timothy [name]) 04/22/20 - A1c 06/22/20 was 7.7%  4: Empyema- - says that he has a couple more doses of IV antibiotics left - saw pulmonology Timothy [name]) 07/28/20 - sees cardiothoracic (Timothy [name]) 09/02/20   Patient did not bring his medications nor a list. Each medication was verbally reviewed with the patient and he was encouraged to bring the bottles to every visit to confirm accuracy of list.  Due to multiple provider appointments and HF stability, will not make a return appointment for patient at this time. Advised patient to continue close follow-up with his other providers but that he could call us back at anytime to schedule another appointment and he was comfortable with this plan.

## 2020-09-01 ENCOUNTER — Other Ambulatory Visit
Admission: RE | Admit: 2020-09-01 | Discharge: 2020-09-01 | Disposition: A | Discharge status: Home / Self Care | Payer: Medicaid Other | Source: Ambulatory Visit | Attending: Family | Admitting: Family

## 2020-09-01 ENCOUNTER — Other Ambulatory Visit: Payer: Self-pay | Admitting: Thoracic Surgery (Cardiothoracic Vascular Surgery)

## 2020-09-01 DIAGNOSIS — I5032 Chronic diastolic (congestive) heart failure: Secondary | ICD-10-CM | POA: Insufficient documentation

## 2020-09-01 DIAGNOSIS — J869 Pyothorax without fistula: Secondary | ICD-10-CM

## 2020-09-01 LAB — CBC
HCT: 31.6 % — ABNORMAL LOW (ref 39.0–52.0)
Hemoglobin: 10.8 g/dL — ABNORMAL LOW (ref 13.0–17.0)
MCH: 31.3 pg (ref 26.0–34.0)
MCHC: 34.2 g/dL (ref 30.0–36.0)
MCV: 91.6 fL (ref 80.0–100.0)
Platelets: 420 10*3/uL — ABNORMAL HIGH (ref 150–400)
RBC: 3.45 MIL/uL — ABNORMAL LOW (ref 4.22–5.81)
RDW: 14.1 % (ref 11.5–15.5)
WBC: 5.8 10*3/uL (ref 4.0–10.5)
nRBC: 0 % (ref 0.0–0.2)

## 2020-09-01 LAB — COMPREHENSIVE METABOLIC PANEL
ALT: 13 U/L (ref 0–44)
AST: 17 U/L (ref 15–41)
Albumin: 3 g/dL — ABNORMAL LOW (ref 3.5–5.0)
Alkaline Phosphatase: 96 U/L (ref 38–126)
Anion gap: 10 (ref 5–15)
BUN: 15 mg/dL (ref 6–20)
CO2: 24 mmol/L (ref 22–32)
Calcium: 8.8 mg/dL — ABNORMAL LOW (ref 8.9–10.3)
Chloride: 96 mmol/L — ABNORMAL LOW (ref 98–111)
Creatinine, Ser: 0.86 mg/dL (ref 0.61–1.24)
GFR, Estimated: >60 mL/min (ref >60)
Glucose, Bld: 358 mg/dL — ABNORMAL HIGH (ref 70–99)
Potassium: 4.8 mmol/L (ref 3.5–5.1)
Sodium: 130 mmol/L — ABNORMAL LOW (ref 135–145)
Total Bilirubin: 0.5 mg/dL (ref 0.3–1.2)
Total Protein: 7.9 g/dL (ref 6.5–8.1)

## 2020-09-02 ENCOUNTER — Other Ambulatory Visit: Payer: Self-pay

## 2020-09-02 ENCOUNTER — Encounter: Payer: Self-pay | Admitting: Thoracic Surgery (Cardiothoracic Vascular Surgery)

## 2020-09-02 ENCOUNTER — Ambulatory Visit
Admission: RE | Admit: 2020-09-02 | Discharge: 2020-09-02 | Disposition: A | Discharge status: Home / Self Care | Payer: Self-pay | Source: Ambulatory Visit | Attending: Thoracic Surgery (Cardiothoracic Vascular Surgery) | Admitting: Thoracic Surgery (Cardiothoracic Vascular Surgery)

## 2020-09-02 ENCOUNTER — Ambulatory Visit (INDEPENDENT_AMBULATORY_CARE_PROVIDER_SITE_OTHER): Payer: Self-pay | Admitting: Thoracic Surgery (Cardiothoracic Vascular Surgery)

## 2020-09-02 VITALS — BP 112/73 | HR 92 | Resp 20 | Ht 70.0 in | Wt 188.0 lb

## 2020-09-02 DIAGNOSIS — Z09 Encounter for follow-up examination after completed treatment for conditions other than malignant neoplasm: Secondary | ICD-10-CM

## 2020-09-02 DIAGNOSIS — J90 Pleural effusion, not elsewhere classified: Secondary | ICD-10-CM

## 2020-09-02 DIAGNOSIS — J869 Pyothorax without fistula: Secondary | ICD-10-CM

## 2020-09-02 NOTE — Progress Notes (Deleted)
Cardiology Office Note    Date:  09/02/2020   ID:  Timothy Sill., DOB May 30, 1961, MRN [id number]  PCP:  Timothy Gala, Timothy [name]  Cardiologist:  Timothy Odea, Timothy [name]  Electrophysiologist:  None   Chief Complaint: ***  History of Present Illness:   Timothy Rijo. is a 60 y.o. male with history of bacterial empyema in 07/2020, COPD with ongoing tobacco use of 40+ years, DM2, HTN, and HLD who presents for ***.  He has been followed by pulmonology for ongoing shortness of breath with underlying COPD and ongoing tobacco use.  Echo obtained by them in 05/2020 showed an EF of 60 to 65%, no regional wall motion abnormalities, normal LV diastolic function parameters, normal RV systolic function and ventricular cavity size, and a mildly dilated left atrium.  PFTs demonstrated moderate to severe COPD.  Prior lung cancer screening CT demonstrated coronary artery calcifications and in this setting he was referred to our office and evaluated by Dr. Azucena Cecil on 08/08/2020 with a 32-month history of progressive shortness of breath sometimes associated with chest discomfort.  It was noted he had previously been started on Entresto by the [facility] CHF clinic, though never started this medication.  BP at his visit with our office was soft at 98/64 with an O2 saturation of 87% on room air.  It was recommended Entresto be discontinued in the setting of soft BP and preserved LV systolic function with normal diastolic function noted on echo as above.  He was scheduled for [facility] MPI, though ultimately had to miss this appointment as he was admitted to the hospital for sepsis with acute hypoxic respiratory failure secondary to Streptococcus intermedius empyema status post VATS with left-sided decortication/chest tube placement.  Hospital admission was complicated by left soleal DVT with recommendation to hold long-term Plavix and treat with Eliquis.  During this admission high-sensitivity troponin was  normal x2.  BNP 222.  ***   Labs independently reviewed: 08/2020 - potassium 4.3, BUN 6, serum creatinine 0.86, albumin 2.1, AST 14, ALT normal, Hgb 10.8, PLT 586 06/2020 - A1c 7.7, TC 167, TG 335, HDL 44, LDL 70 10/2019 -TSH normal   Past Medical History:  Diagnosis Date  . CHF (congestive heart failure) (HCC)   . Diabetes mellitus without complication (HCC)   . Hypercholesteremia   . Hypertension     Past Surgical History:  Procedure Laterality Date  . AMPUTATION Left 03/22/2020   Procedure: AMPUTATION RAY-1st Ray Left Foot;  Surgeon: Timothy [name], Timothy [name];  Location: [facility] ORS;  Service: Podiatry;  Laterality: Left;  . AMPUTATION TOE Left 03/16/2020   Procedure: AMPUTATION GREAT TOE;  Surgeon: Timothy [name], Timothy [name];  Location: [facility] ORS;  Service: Podiatry;  Laterality: Left;  . COLONOSCOPY WITH PROPOFOL N/A 12/22/2019   Procedure: COLONOSCOPY WITH PROPOFOL;  Surgeon: Timothy Minium, Timothy [name];  Location: [facility] ENDOSCOPY;  Service: Endoscopy;  Laterality: N/A;  . LOWER EXTREMITY ANGIOGRAPHY Left 03/18/2020   Procedure: Lower Extremity Angiography;  Surgeon: Timothy Dills, Timothy [name];  Location: [facility] INVASIVE CV LAB;  Service: Cardiovascular;  Laterality: Left;  . TONSILLECTOMY    . VIDEO ASSISTED THORACOSCOPY (VATS)/DECORTICATION Left 08/15/2020   Procedure: VIDEO ASSISTED THORACOSCOPY (VATS)/DECORTICATION;  Surgeon: Timothy Skains, Timothy [name];  Location: [facility] OR;  Service: Thoracic;  Laterality: Left;  . WISDOM TOOTH EXTRACTION      Current Medications: No outpatient medications have been marked as taking for the 09/08/20 encounter (Appointment) with Timothy Barges, Timothy [name].    Allergies:   Bee  venom, Coffea arabica, Coffee flavor, and Cheese   Social History   Socioeconomic History  . Marital status: Single    Spouse name: Not on file  . Number of children: 0  . Years of education: Not on file  . Highest education level: Not on file  Occupational History  . Occupation: Unemployed  Tobacco Use  . Smoking  status: Current Every Day Smoker    Packs/day: 2.00    Years: 41.00    Pack years: 82.00    Types: Cigarettes  . Smokeless tobacco: Never Used  . Tobacco comment: 1PPD 07/28/2020  Vaping Use  . Vaping Use: Never used  Substance and Sexual Activity  . Alcohol use: Yes    Alcohol/week: 6.0 standard drinks    Types: 6 Cans of beer per week    Comment: occasionally   . Drug use: Not Currently  . Sexual activity: Not Currently  Other Topics Concern  . Not on file  Social History Narrative  . Not on file   Social Determinants of Health   Financial Resource Strain: Medium Risk  . Difficulty of Paying Living Expenses: Somewhat hard  Food Insecurity: No Food Insecurity  . Worried About Programme researcher, broadcasting/film/video in the Last Year: Never true  . Ran Out of Food in the Last Year: Never true  Transportation Needs: No Transportation Needs  . Lack of Transportation (Medical): No  . Lack of Transportation (Non-Medical): No  Physical Activity: Inactive  . Days of Exercise per Week: 0 days  . Minutes of Exercise per Session: 0 min  Stress: Stress Concern Present  . Feeling of Stress : Rather much  Social Connections: Unknown  . Frequency of Communication with Friends and Family: Three times a week  . Frequency of Social Gatherings with Friends and Family: Never  . Attends Religious Services: Never  . Active Member of Clubs or Organizations: No  . Attends Banker Meetings: Never  . Marital Status: Not on file     Family History:  The patient's Family history is unknown by patient.  ROS:   ROS   EKGs/Labs/Other Studies Reviewed:    Studies reviewed were summarized above. The additional studies were reviewed today:  2D echo 05/2020: 1. Left ventricular ejection fraction, by estimation, is 60 to 65%. The  left ventricle has normal function. The left ventricle has no regional  wall motion abnormalities. Left ventricular diastolic parameters were  normal.  2. Right  ventricular systolic function is normal. The right ventricular  size is normal. Tricuspid regurgitation signal is inadequate for assessing  PA pressure.  3. Left atrial size was mildly dilated.  __________  Eugenie Birks MPI: Pending  EKG:  EKG is ordered today.  The EKG ordered today demonstrates ***  Recent Labs: 10/28/2019: TSH 1.720 08/10/2020: B Natriuretic Peptide 222.7 08/31/2020: ALT 13; BUN 15; Creatinine, Ser 0.86; Hemoglobin 10.8; Platelets 420; Potassium 4.8; Sodium 130  Recent Lipid Panel    Component Value Date/Time   CHOL 167 06/22/2020 1058   TRIG 335 (H) 06/22/2020 1058   HDL 44 06/22/2020 1058   CHOLHDL 3.8 06/22/2020 1058   LDLCALC 70 06/22/2020 1058    PHYSICAL EXAM:    VS:  There were no vitals taken for this visit.  BMI: There is no height or weight on file to calculate BMI.  Physical Exam  Wt Readings from Last 3 Encounters:  08/31/20 205 lb (93 kg)  08/11/20 205 lb 11 oz (93.3 kg)  08/10/20 212 lb (  96.2 kg)     ASSESSMENT & PLAN:   1. Shortness of breath:  2. Coronary artery calcification:  3. HTN: Blood pressure ***  4. HLD: LDL 70 in 06/2020.  ***  5. History of empyema status post VATS with decortication/COPD/ongoing tobacco use: ***  Disposition: F/u with Dr. Azucena Cecil or an APP in ***.   Medication Adjustments/Labs and Tests Ordered: Current medicines are reviewed at length with the patient today.  Concerns regarding medicines are outlined above. Medication changes, Labs and Tests ordered today are summarized above and listed in the Patient Instructions accessible in Encounters.   Signed, Timothy Listen, Timothy [name] 09/02/2020 10:14 AM     [facility] [address] [place], [place] [address] [phone]

## 2020-09-02 NOTE — Progress Notes (Signed)
      [address]       [place] [address]             [phone]        [name] [place] Medical Record #[id number] Date of Birth: [birth year]  Referring: Darlin Drop, DO Primary Care: Rolm Gala, NP Primary Cardiologist:Brian Agbor-Etang, MD  Reason for visit:   follow-up  History of Present Illness:     60 year old male presents for his first follow-up appointment after undergoing left VATS decortication for loculated empyema.  Remains on some oxygen at 3 L.  His breathing has improved somewhat overall.  Physical Exam: BP 112/73   Pulse 92   Resp 20   Ht 5\' 10"  (1.778 m)   Wt 188 lb (85.3 kg)   SpO2 98% Comment: 3L O2 per [place]  BMI 26.98 kg/m   Alert NAD Incision clean, stitch removed.   Abdomen soft, ND No peripheral edema   Diagnostic Studies & Laboratory data: CXR: Improved aeration of the lung.  Small residual effusion. Path: No malignancy noted on path.    Assessment / Plan:   60 year old male status post left VATS decortication. He is on supplemental oxygen but states that even prior to his admission he was experiencing exertional dyspnea.  He is followed with Dr. 41 as his pulmonologist.  We have made a repeat appointment with her Follow-up with me as needed.   Jayme Cloud 09/02/2020 12:57 PM

## 2020-09-07 ENCOUNTER — Telehealth: Payer: Self-pay | Admitting: *Deleted

## 2020-09-07 ENCOUNTER — Ambulatory Visit: Payer: Self-pay | Admitting: Internal Medicine

## 2020-09-07 ENCOUNTER — Encounter: Payer: Self-pay | Admitting: Internal Medicine

## 2020-09-07 ENCOUNTER — Telehealth: Payer: Self-pay | Admitting: Physician Assistant

## 2020-09-07 ENCOUNTER — Other Ambulatory Visit: Payer: Self-pay

## 2020-09-07 VITALS — BP 118/77 | HR 86 | Temp 93.7°F | Ht 70.0 in | Wt 191.4 lb

## 2020-09-07 DIAGNOSIS — J9601 Acute respiratory failure with hypoxia: Secondary | ICD-10-CM

## 2020-09-07 DIAGNOSIS — R0602 Shortness of breath: Secondary | ICD-10-CM

## 2020-09-07 NOTE — Telephone Encounter (Signed)
Abby with [facility] called in regards to Timothy [name]'s Rocephin order. Per Abby, the patient has inconsistently been getting his prescribed IV Rocephin. Abby states she visited Timothy [name] today for discharge when he told her he still has two doses of antibiotics left. Per Abby, patient was suppose to be done with the antibiotics days ago. Abby contacted our office for further advice. Per Dr. Cliffton Asters, advised Abby to get in contact with patient's PCP for management of patient's antibiotic regimen. Abby notified of this. No further questions at this time.

## 2020-09-07 NOTE — Telephone Encounter (Signed)
-----   Message from Festus Aloe, CMA sent at 09/07/2020  3:39 PM EDT ----- Please contact the patient. The patient cancelled the stress test and is down to see Eula Listen, PA tomorrow. Please cancel follow up appt until after the stress test unless the patient has a problem or any complications. Thanks, Jasmine December

## 2020-09-07 NOTE — Progress Notes (Signed)
Established Patient Office Visit  Subjective:  Patient ID: Timothy [name]., male    DOB: [birth year]  Age: 60 y.o. MRN: [id number]  CC: No chief complaint on file.   HPI Timothy [name]. presents for fu from recent hospitalization.  Past Medical History:  Diagnosis Date  . CHF (congestive heart failure) (Timothy [name])   . Diabetes mellitus without complication (Timothy [name])   . Hypercholesteremia   . Hypertension     Past Surgical History:  Procedure Laterality Date  . AMPUTATION Left 03/22/2020   Procedure: AMPUTATION RAY-1st Ray Left Foot;  Surgeon: [name], [name];  Location: [facility] ORS;  Service: Podiatry;  Laterality: Left;  . AMPUTATION TOE Left 03/16/2020   Procedure: AMPUTATION GREAT TOE;  Surgeon: [name], [name];  Location: [facility] ORS;  Service: Podiatry;  Laterality: Left;  . COLONOSCOPY WITH PROPOFOL N/A 12/22/2019   Procedure: COLONOSCOPY WITH PROPOFOL;  Surgeon: [name], MD;  Location: [facility] ENDOSCOPY;  Service: Endoscopy;  Laterality: N/A;  . LOWER EXTREMITY ANGIOGRAPHY Left 03/18/2020   Procedure: Lower Extremity Angiography;  Surgeon: [name], MD;  Location: [place] CV LAB;  Service: Cardiovascular;  Laterality: Left;  . TONSILLECTOMY    . VIDEO ASSISTED THORACOSCOPY (VATS)/DECORTICATION Left 08/15/2020   Procedure: VIDEO ASSISTED THORACOSCOPY (VATS)/DECORTICATION;  Surgeon: [name], MD;  Location: [place];  Service: Thoracic;  Laterality: Left;  . WISDOM TOOTH EXTRACTION      Family History  Family history unknown: Yes    Social History   Socioeconomic History  . Marital status: Single    Spouse name: Not on file  . Number of children: 0  . Years of education: Not on file  . Highest education level: Not on file  Occupational History  . Occupation: Unemployed  Tobacco Use  . Smoking status: Current Every Day Smoker    Packs/day: 2.00    Years: 41.00    Pack years: 82.00    Types: Cigarettes  . Smokeless tobacco: Never Used  .  Tobacco comment: 1PPD 07/28/2020  Vaping Use  . Vaping Use: Never used  Substance and Sexual Activity  . Alcohol use: Yes    Alcohol/week: 6.0 standard drinks    Types: 6 Cans of beer per week    Comment: occasionally   . Drug use: Not Currently  . Sexual activity: Not Currently  Other Topics Concern  . Not on file  Social History Narrative  . Not on file   Social Determinants of Health   Financial Resource Strain: Medium Risk  . Difficulty of Paying Living Expenses: Somewhat hard  Food Insecurity: No Food Insecurity  . Worried About Charity fundraiser in the Last Year: Never true  . Ran Out of Food in the Last Year: Never true  Transportation Needs: No Transportation Needs  . Lack of Transportation (Medical): No  . Lack of Transportation (Non-Medical): No  Physical Activity: Inactive  . Days of Exercise per Week: 0 days  . Minutes of Exercise per Session: 0 min  Stress: Stress Concern Present  . Feeling of Stress : Rather much  Social Connections: Unknown  . Frequency of Communication with Friends and Family: Three times a week  . Frequency of Social Gatherings with Friends and Family: Never  . Attends Religious Services: Never  . Active Member of Clubs or Organizations: No  . Attends Archivist Meetings: Never  . Marital Status: Not on file  Intimate Partner Violence: Not At Risk  . Fear of  Current or Ex-Partner: No  . Emotionally Abused: No  . Physically Abused: No  . Sexually Abused: No    Outpatient Medications Prior to Visit  Medication Sig Dispense Refill  . Acetaminophen (ARTHRITIS PAIN PO) Take 1,000 mg by mouth daily.    Marland Kitchen albuterol (VENTOLIN HFA) 108 (90 Base) MCG/ACT inhaler Inhale 1 puff into the lungs every 6 (six) hours as needed for wheezing or shortness of breath. 18 g 11  . amLODipine (NORVASC) 5 MG tablet Take 1 tablet (5 mg total) by mouth daily. 90 tablet 1  . apixaban (ELIQUIS) 5 MG TABS tablet Take 2 tablets (69m) twice daily for 2  days, then 1 tablet (560m twice daily starting 08/26/20 60 tablet 3  . blood glucose meter kit and supplies KIT Dispense based on patient and insurance preference. Use up to four times daily as directed. (FOR ICD-9 250.00, 250.01). 1 each 0  . citalopram (CELEXA) 20 MG tablet TAKE ONE TABLET BY MOUTH EVERY DAY (Patient taking differently: Take 20 mg by mouth daily.) 30 tablet 0  . cloNIDine (CATAPRES) 0.1 MG tablet TAKE ONE TABLET BY MOUTH AT BEDTIME (Patient taking differently: Take 0.1 mg by mouth at bedtime.) 56 tablet 0  . gabapentin (NEURONTIN) 300 MG capsule Take 1 capsule (300 mg total) by mouth 3 (three) times daily. 90 capsule 2  . glipiZIDE (GLUCOTROL) 5 MG tablet Take 10 mg in the morning and 5 mg in the evening (Patient taking differently: Take 5-10 mg by mouth See admin instructions. Take 10 mg in the morning and 5 mg in the evening) 90 tablet 2  . Magnesium 400 MG CAPS Take 400 mg by mouth daily.    . metFORMIN (GLUCOPHAGE) 1000 MG tablet Take 1 tablet (1,000 mg total) by mouth 2 (two) times daily with a meal. 60 tablet 3  . nicotine (NICODERM CQ - DOSED IN MG/24 HOURS) 21 mg/24hr patch Place 1 patch (21 mg total) onto the skin daily. 28 patch 0  . pantoprazole (PROTONIX) 40 MG tablet Take 1 tablet (40 mg total) by mouth daily. 30 tablet 3  . rosuvastatin (CRESTOR) 5 MG tablet Take 1 tablet (5 mg total) by mouth daily. 30 tablet 3  . triamcinolone (KENALOG) 0.025 % ointment Apply 1 application topically 2 (two) times daily. To buttocks 60 g 2   No facility-administered medications prior to visit.    Allergies  Allergen Reactions  . Bee Venom Anaphylaxis  . Coffea Arabica Shortness Of Breath  . Coffee Flavor Shortness Of Breath  . Cheese Swelling    Blue Cheese only    ROS Review of Systems    Objective:    Physical Exam  There were no vitals taken for this visit. Wt Readings from Last 3 Encounters:  09/02/20 188 lb (85.3 kg)  08/31/20 205 lb (93 kg)  08/11/20 205 lb  11 oz (93.3 kg)     Health Maintenance Due  Topic Date Due  . COVID-19 Vaccine (1) Never done  . OPHTHALMOLOGY EXAM  Never done  . TETANUS/TDAP  Never done    There are no preventive care reminders to display for this patient.  Lab Results  Component Value Date   TSH 1.720 10/28/2019   Lab Results  Component Value Date   WBC 5.8 08/31/2020   HGB 10.8 (L) 08/31/2020   HCT 31.6 (L) 08/31/2020   MCV 91.6 08/31/2020   PLT 420 (H) 08/31/2020   Lab Results  Component Value Date   NA 130 (  L) 08/31/2020   K 4.8 08/31/2020   CO2 24 08/31/2020   GLUCOSE 358 (H) 08/31/2020   BUN 15 08/31/2020   CREATININE 0.86 08/31/2020   BILITOT 0.5 08/31/2020   ALKPHOS 96 08/31/2020   AST 17 08/31/2020   ALT 13 08/31/2020   PROT 7.9 08/31/2020   ALBUMIN 3.0 (L) 08/31/2020   CALCIUM 8.8 (L) 08/31/2020   ANIONGAP 10 08/31/2020   Lab Results  Component Value Date   CHOL 167 06/22/2020   Lab Results  Component Value Date   HDL 44 06/22/2020   Lab Results  Component Value Date   LDLCALC 70 06/22/2020   Lab Results  Component Value Date   TRIG 335 (H) 06/22/2020   Lab Results  Component Value Date   CHOLHDL 3.8 06/22/2020   Lab Results  Component Value Date   HGBA1C 7.7 (H) 06/22/2020      Assessment & Plan:   Problem List Items Addressed This Visit   None     No orders of the defined types were placed in this encounter. 1. Follow up Pt recovering from a pleura VATS decortification. Unsure why he was asked to come in today as he has an appt and follow ups in several week. Overall, pt reports doing well. Encouraged him to keep appointments previously scheduled.     Follow-up: No follow-ups on file.    Taeja Debellis Charyl Bigger

## 2020-09-07 NOTE — Telephone Encounter (Signed)
Called patient to reschedule  No answer / no VM

## 2020-09-08 ENCOUNTER — Ambulatory Visit: Payer: Self-pay | Admitting: Physician Assistant

## 2020-09-08 ENCOUNTER — Telehealth: Payer: Self-pay | Admitting: Pulmonary Disease

## 2020-09-08 ENCOUNTER — Telehealth: Payer: Self-pay | Admitting: Physician Assistant

## 2020-09-08 NOTE — Telephone Encounter (Signed)
Called and spoke with Maxine Glenn at home health. Advised that we are not able to pull patients PICC line here in our office. She requested that I please notify patient that they will be out there tomorrow to take care of that for him. She was appreciative for the call back with no further questions at this time.

## 2020-09-08 NOTE — Telephone Encounter (Signed)
Dr. Jayme Cloud, please review chart and let me know if we can get patient in sooner then 10/20/20?

## 2020-09-08 NOTE — Telephone Encounter (Signed)
Nurse with Home Health is calling to ask if we are able to pull patients pic line today at his visit.

## 2020-09-08 NOTE — Telephone Encounter (Signed)
appt has been scheduled for 10/20/2020 at 4:00. Letter has been mailed to address on file per patient request.

## 2020-09-08 NOTE — Telephone Encounter (Signed)
Currently it looks like he is being followed by many providers.  Cardiothoracic has been following post empyema.  He is also following with primary.  He is still getting IV antibiotics.  Currently we would not do anything different.  We can try to get him in sooner with one of the APP's but otherwise I believe that 5/5 appointment is okay.

## 2020-09-08 NOTE — Telephone Encounter (Signed)
Patient was discharged with PICC line for IV antibiotics and other infusions. Home health went out to assess and it had been cut. Patient refused nurse to come out today due to his appointment. Nikkie RN received orders to discontinue the PICC line and they wanted to know if we could do that when he comes in today for his appointment. Advised I will check with provider and would call her back either way to let her know. She verbalized understanding with no further questions at this time.

## 2020-09-08 NOTE — Telephone Encounter (Signed)
Reviewed request from provider and we are not able to do that here in this setting. Will call home health nurse back to make her aware.

## 2020-09-15 ENCOUNTER — Encounter
Admission: RE | Admit: 2020-09-15 | Discharge: 2020-09-15 | Disposition: A | Discharge status: Home / Self Care | Payer: Medicaid Other | Source: Ambulatory Visit | Attending: Cardiology | Admitting: Cardiology

## 2020-09-15 ENCOUNTER — Other Ambulatory Visit: Payer: Self-pay

## 2020-09-15 DIAGNOSIS — R0600 Dyspnea, unspecified: Secondary | ICD-10-CM

## 2020-09-15 DIAGNOSIS — R0602 Shortness of breath: Secondary | ICD-10-CM

## 2020-09-16 ENCOUNTER — Ambulatory Visit: Admission: RE | Admit: 2020-09-16 | Payer: Self-pay | Source: Ambulatory Visit

## 2020-09-16 NOTE — Progress Notes (Deleted)
Cardiology Office Note    Date:  09/16/2020   ID:  Timothy Sill., DOB [birth year], MRN [id number]  PCP:  Rolm Gala, NP  Cardiologist:  Debbe Odea, MD  Electrophysiologist:  None   Chief Complaint: Follow-up Lexiscan MPI  History of Present Illness:   Timothy Reiber. is a 60 y.o. male with history of bacterial empyema in 07/2020, COPD with ongoing tobacco use of 40+ years, DM2, HTN, and HLD who presents for follow-up of Lexiscan.  He has been followed by pulmonology for ongoing shortness of breath with underlying COPD and ongoing tobacco use.  Echo obtained by them in 05/2020 showed an EF of 60 to 65%, no regional wall motion abnormalities, normal LV diastolic function parameters, normal RV systolic function and ventricular cavity size, and a mildly dilated left atrium.  PFTs demonstrated moderate to severe COPD.  Prior lung cancer screening CT demonstrated coronary artery calcifications and in this setting he was referred to our office and evaluated by Dr. Azucena Cecil on 08/08/2020 with a 65-month history of progressive shortness of breath sometimes associated with chest discomfort.  It was noted he had previously been started on Entresto by the [facility] CHF clinic, though never started this medication.  BP at his visit with our office was soft at 98/64 with an O2 saturation of 87% on room air.  It was recommended Entresto be discontinued in the setting of soft BP and preserved LV systolic function with normal diastolic function noted on echo as above.  He was scheduled for [facility] MPI, though ultimately had to miss this appointment as he was admitted to the hospital for sepsis with acute hypoxic respiratory failure secondary to Streptococcus intermedius empyema status post VATS with left-sided decortication/chest tube placement.  Hospital admission was complicated by left soleal DVT with recommendation to hold long-term Plavix and treat with Eliquis.  During this  admission high-sensitivity troponin was normal x2.  BNP 222.  ***   Labs independently reviewed: 08/2020 - potassium 4.3, BUN 6, serum creatinine 0.86, albumin 2.1, AST 14, ALT normal, Hgb 10.8, PLT 586 06/2020 - A1c 7.7, TC 167, TG 335, HDL 44, LDL 70 10/2019 -TSH normal   Past Medical History:  Diagnosis Date  . CHF (congestive heart failure) (HCC)   . Diabetes mellitus without complication (HCC)   . Hypercholesteremia   . Hypertension     Past Surgical History:  Procedure Laterality Date  . AMPUTATION Left 03/22/2020   Procedure: AMPUTATION RAY-1st Ray Left Foot;  Surgeon: Linus Galas, [name];  Location: [facility] ORS;  Service: Podiatry;  Laterality: Left;  . AMPUTATION TOE Left 03/16/2020   Procedure: AMPUTATION GREAT TOE;  Surgeon: Linus Galas, [name];  Location: [facility] ORS;  Service: Podiatry;  Laterality: Left;  . COLONOSCOPY WITH PROPOFOL N/A 12/22/2019   Procedure: COLONOSCOPY WITH PROPOFOL;  Surgeon: Midge Minium, MD;  Location: [facility] ENDOSCOPY;  Service: Endoscopy;  Laterality: N/A;  . LOWER EXTREMITY ANGIOGRAPHY Left 03/18/2020   Procedure: Lower Extremity Angiography;  Surgeon: Renford Dills, MD;  Location: [facility] INVASIVE CV LAB;  Service: Cardiovascular;  Laterality: Left;  . TONSILLECTOMY    . VIDEO ASSISTED THORACOSCOPY (VATS)/DECORTICATION Left 08/15/2020   Procedure: VIDEO ASSISTED THORACOSCOPY (VATS)/DECORTICATION;  Surgeon: Corliss Skains, MD;  Location: [facility] OR;  Service: Thoracic;  Laterality: Left;  . WISDOM TOOTH EXTRACTION      Current Medications: No outpatient medications have been marked as taking for the 09/22/20 encounter (Appointment) with Sondra Barges, PA-C.  Allergies:   Bee venom, Coffea arabica, Coffee flavor, and Cheese   Social History   Socioeconomic History  . Marital status: Single    Spouse name: Not on file  . Number of children: 0  . Years of education: Not on file  . Highest education level: Not on file  Occupational History  .  Occupation: Unemployed  Tobacco Use  . Smoking status: Current Every Day Smoker    Packs/day: 2.00    Years: 41.00    Pack years: 82.00    Types: Cigarettes  . Smokeless tobacco: Never Used  . Tobacco comment: 1PPD 07/28/2020  Vaping Use  . Vaping Use: Never used  Substance and Sexual Activity  . Alcohol use: Yes    Alcohol/week: 6.0 standard drinks    Types: 6 Cans of beer per week    Comment: occasionally   . Drug use: Not Currently  . Sexual activity: Not Currently  Other Topics Concern  . Not on file  Social History Narrative  . Not on file   Social Determinants of Health   Financial Resource Strain: Medium Risk  . Difficulty of Paying Living Expenses: Somewhat hard  Food Insecurity: No Food Insecurity  . Worried About Programme researcher, broadcasting/film/video in the Last Year: Never true  . Ran Out of Food in the Last Year: Never true  Transportation Needs: No Transportation Needs  . Lack of Transportation (Medical): No  . Lack of Transportation (Non-Medical): No  Physical Activity: Inactive  . Days of Exercise per Week: 0 days  . Minutes of Exercise per Session: 0 min  Stress: Stress Concern Present  . Feeling of Stress : Rather much  Social Connections: Unknown  . Frequency of Communication with Friends and Family: Three times a week  . Frequency of Social Gatherings with Friends and Family: Never  . Attends Religious Services: Never  . Active Member of Clubs or Organizations: No  . Attends Banker Meetings: Never  . Marital Status: Not on file     Family History:  The patient's Family history is unknown by patient.  ROS:   ROS   EKGs/Labs/Other Studies Reviewed:    Studies reviewed were summarized above. The additional studies were reviewed today:  2D echo 05/2020: 1. Left ventricular ejection fraction, by estimation, is 60 to 65%. The  left ventricle has normal function. The left ventricle has no regional  wall motion abnormalities. Left ventricular  diastolic parameters were  normal.  2. Right ventricular systolic function is normal. The right ventricular  size is normal. Tricuspid regurgitation signal is inadequate for assessing  PA pressure.  3. Left atrial size was mildly dilated.  __________  Eugenie Birks MPI: Pending   EKG:  EKG is ordered today.  The EKG ordered today demonstrates ***  Recent Labs: 10/28/2019: TSH 1.720 08/10/2020: B Natriuretic Peptide 222.7 08/31/2020: ALT 13; BUN 15; Creatinine, Ser 0.86; Hemoglobin 10.8; Platelets 420; Potassium 4.8; Sodium 130  Recent Lipid Panel    Component Value Date/Time   CHOL 167 06/22/2020 1058   TRIG 335 (H) 06/22/2020 1058   HDL 44 06/22/2020 1058   CHOLHDL 3.8 06/22/2020 1058   LDLCALC 70 06/22/2020 1058    PHYSICAL EXAM:    VS:  There were no vitals taken for this visit.  BMI: There is no height or weight on file to calculate BMI.  Physical Exam  Wt Readings from Last 3 Encounters:  09/07/20 191 lb 6.4 oz (86.8 kg)  09/02/20 188 lb (85.3  kg)  08/31/20 205 lb (93 kg)     ASSESSMENT & PLAN:   1. Shortness of breath:  2. Coronary artery calcification:  3. HTN: Blood pressure ***  4. HLD: LDL 70 in 06/2020.  ***  5. History of empyema status post VATS with decortication/COPD/ongoing tobacco use: ***   Disposition: F/u with Dr. Azucena Cecil or an APP in ***.   Medication Adjustments/Labs and Tests Ordered: Current medicines are reviewed at length with the patient today.  Concerns regarding medicines are outlined above. Medication changes, Labs and Tests ordered today are summarized above and listed in the Patient Instructions accessible in Encounters.   Signed, Eula Listen, PA-C 09/16/2020 3:04 PM     [facility] [address] [place], [place] [address] [phone]

## 2020-09-19 ENCOUNTER — Other Ambulatory Visit: Payer: Self-pay

## 2020-09-20 LAB — FUNGUS CULTURE WITH STAIN

## 2020-09-20 LAB — FUNGUS CULTURE RESULT

## 2020-09-20 LAB — FUNGAL ORGANISM REFLEX

## 2020-09-21 ENCOUNTER — Other Ambulatory Visit: Payer: Self-pay

## 2020-09-21 DIAGNOSIS — E1140 Type 2 diabetes mellitus with diabetic neuropathy, unspecified: Secondary | ICD-10-CM

## 2020-09-22 ENCOUNTER — Ambulatory Visit: Payer: Self-pay | Admitting: Physician Assistant

## 2020-09-22 LAB — HEMOGLOBIN A1C
Est. average glucose Bld gHb Est-mCnc: 192 mg/dL
Hgb A1c MFr Bld: 8.3 % — ABNORMAL HIGH (ref 4.8–5.6)

## 2020-09-22 NOTE — Telephone Encounter (Signed)
Attempted to schedule no ans no vm  

## 2020-09-28 ENCOUNTER — Other Ambulatory Visit: Payer: Self-pay

## 2020-09-28 ENCOUNTER — Ambulatory Visit: Payer: Self-pay | Admitting: Gerontology

## 2020-09-28 ENCOUNTER — Encounter: Payer: Self-pay | Admitting: Gerontology

## 2020-09-28 VITALS — BP 110/71 | HR 93 | Temp 97.0°F | Resp 18 | Wt 196.1 lb

## 2020-09-28 DIAGNOSIS — F4310 Post-traumatic stress disorder, unspecified: Secondary | ICD-10-CM

## 2020-09-28 DIAGNOSIS — G6289 Other specified polyneuropathies: Secondary | ICD-10-CM

## 2020-09-28 DIAGNOSIS — E1140 Type 2 diabetes mellitus with diabetic neuropathy, unspecified: Secondary | ICD-10-CM

## 2020-09-28 MED ORDER — GLIPIZIDE 10 MG PO TABS
10.0000 mg | ORAL_TABLET | Freq: Two times a day (BID) | ORAL | 2 refills | Status: DC
Start: 2020-09-28 — End: 2021-01-16
  Filled 2020-09-28: qty 60, 30d supply, fill #0
  Filled 2020-11-08: qty 60, 30d supply, fill #1
  Filled 2020-12-12: qty 60, 30d supply, fill #2

## 2020-09-28 MED ORDER — CITALOPRAM HYDROBROMIDE 20 MG PO TABS
20.0000 mg | ORAL_TABLET | Freq: Every day | ORAL | 0 refills | Status: DC
  Filled 2020-09-28: qty 30, 30d supply, fill #0

## 2020-09-28 MED ORDER — GABAPENTIN 400 MG PO CAPS
400.0000 mg | ORAL_CAPSULE | Freq: Three times a day (TID) | ORAL | 2 refills | Status: DC
Start: 2020-09-28 — End: 2021-01-16
  Filled 2020-09-28: qty 90, 30d supply, fill #0
  Filled 2020-11-08: qty 90, 30d supply, fill #1
  Filled 2020-12-12: qty 90, 30d supply, fill #2

## 2020-09-28 MED ORDER — CLONIDINE HCL 0.1 MG PO TABS
0.1000 mg | ORAL_TABLET | Freq: Every day | ORAL | 0 refills | Status: DC
  Filled 2020-09-28: qty 56, 56d supply, fill #0

## 2020-09-28 MED ORDER — METFORMIN HCL 1000 MG PO TABS
ORAL_TABLET | Freq: Two times a day (BID) | ORAL | 3 refills | Status: DC
  Filled 2020-09-28: qty 60, 30d supply, fill #0
  Filled 2020-11-08: qty 60, 30d supply, fill #1
  Filled 2020-12-12: qty 60, 30d supply, fill #2

## 2020-09-28 NOTE — Progress Notes (Signed)
Established Patient Office Visit  Subjective:  Patient ID: Timothy [name]., male    DOB: February 03, 1961  Age: 60 y.o. MRN: [id number]  CC: No chief complaint on file.   HPI Timothy [name]. 60 y/o male who has history of CHF, T2DM, Hyperlipidemia and Hypertension presents for lab review. He was recently discharged from the hospital after being treated for empyema. He continues on 3L oxygen via nasal canula, denies shortness of breath, chest pain and light headedness. He will follow up with the Pulmonologist Dr Patsey Berthold on 10/20/20. His HgbA1c done on 09/21/20 increased from 7.7%, to 8.3%. He's not consistent with checking his blood glucose, and it was 234 mg/dl when last checked 2 days. He denies hypoglycemic symptoms, and reports occasional polyuria and peripheral neuropathy is not improved with taking 300 mg of gabapentin tid. His POCT blood glucose was 189 mg/dl during visit. He has a history of depression and was previously seen at [facility] with [facility] behavioral health and unable to keep his F/U appointment. He states that his mood is dysphoric, denies SI/HI ideation and request mental health. Overall, he states that he's doing well and offers no further complaint.  Past Medical History:  Diagnosis Date  . CHF (congestive heart failure) ([place])   . Diabetes mellitus without complication ([place])   . Hypercholesteremia   . Hypertension     Past Surgical History:  Procedure Laterality Date  . AMPUTATION Left 03/22/2020   Procedure: AMPUTATION RAY-1st Ray Left Foot;  Surgeon: [name], [name];  Location: [facility] ORS;  Service: Podiatry;  Laterality: Left;  . AMPUTATION TOE Left 03/16/2020   Procedure: AMPUTATION GREAT TOE;  Surgeon: [name], [name];  Location: [facility] ORS;  Service: Podiatry;  Laterality: Left;  . COLONOSCOPY WITH PROPOFOL N/A 12/22/2019   Procedure: COLONOSCOPY WITH PROPOFOL;  Surgeon: [name], MD;  Location: [facility] ENDOSCOPY;  Service: Endoscopy;  Laterality: N/A;  .  LOWER EXTREMITY ANGIOGRAPHY Left 03/18/2020   Procedure: Lower Extremity Angiography;  Surgeon: [name], MD;  Location: [place] CV LAB;  Service: Cardiovascular;  Laterality: Left;  . TONSILLECTOMY    . VIDEO ASSISTED THORACOSCOPY (VATS)/DECORTICATION Left 08/15/2020   Procedure: VIDEO ASSISTED THORACOSCOPY (VATS)/DECORTICATION;  Surgeon: [name], MD;  Location: [place];  Service: Thoracic;  Laterality: Left;  . WISDOM TOOTH EXTRACTION      Family History  Family history unknown: Yes    Social History   Socioeconomic History  . Marital status: Single    Spouse name: Not on file  . Number of children: 0  . Years of education: Not on file  . Highest education level: Not on file  Occupational History  . Occupation: Unemployed  Tobacco Use  . Smoking status: Current Every Day Smoker    Packs/day: 2.00    Years: 41.00    Pack years: 82.00    Types: Cigarettes  . Smokeless tobacco: Never Used  . Tobacco comment: 1PPD 07/28/2020  Vaping Use  . Vaping Use: Never used  Substance and Sexual Activity  . Alcohol use: Yes    Alcohol/week: 6.0 standard drinks    Types: 6 Cans of beer per week    Comment: occasionally   . Drug use: Not Currently  . Sexual activity: Not Currently  Other Topics Concern  . Not on file  Social History Narrative  . Not on file   Social Determinants of Health   Financial Resource Strain: Medium Risk  . Difficulty of Paying Living Expenses:  Somewhat hard  Food Insecurity: No Food Insecurity  . Worried About Charity fundraiser in the Last Year: Never true  . Ran Out of Food in the Last Year: Never true  Transportation Needs: No Transportation Needs  . Lack of Transportation (Medical): No  . Lack of Transportation (Non-Medical): No  Physical Activity: Inactive  . Days of Exercise per Week: 0 days  . Minutes of Exercise per Session: 0 min  Stress: Stress Concern Present  . Feeling of Stress : Rather much  Social  Connections: Unknown  . Frequency of Communication with Friends and Family: Three times a week  . Frequency of Social Gatherings with Friends and Family: Never  . Attends Religious Services: Never  . Active Member of Clubs or Organizations: No  . Attends Archivist Meetings: Never  . Marital Status: Not on file  Intimate Partner Violence: Not At Risk  . Fear of Current or Ex-Partner: No  . Emotionally Abused: No  . Physically Abused: No  . Sexually Abused: No    Outpatient Medications Prior to Visit  Medication Sig Dispense Refill  . Acetaminophen (ARTHRITIS PAIN PO) Take 1,000 mg by mouth daily.    Marland Kitchen albuterol (VENTOLIN HFA) 108 (90 Base) MCG/ACT inhaler INHALE ONE PUFF INTO THE LUNGS EVERY 6 HOURS AS NEEDED FOR WHEEZING OR SHORTNESS OF BREATH. 8.5 g 11  . amLODipine (NORVASC) 5 MG tablet TAKE ONE TABLET BY MOUTH EVERY DAY 90 tablet 1  . Fluticasone-Umeclidin-Vilant 100-62.5-25 MCG/INH AEPB INHALE 1 PUFF INTO THE LUNGS EVERY DAY 60 each 11  . Magnesium 400 MG CAPS Take 400 mg by mouth daily.    . pantoprazole (PROTONIX) 40 MG tablet TAKE ONE TABLET BY MOUTH EVERY DAY 30 tablet 3  . rosuvastatin (CRESTOR) 5 MG tablet TAKE ONE TABLET BY MOUTH EVERY DAY 30 tablet 3  . TRELEGY ELLIPTA 100-62.5-25 MCG/INH AEPB INHALE 1 PUFF INTO THE LUNGS EVERY DAY 180 each 3  . apixaban (ELIQUIS) 5 MG TABS tablet Take 2 tablets (30m) twice daily for 2 days, then 1 tablet (532m twice daily starting 08/26/20 60 tablet 3  . citalopram (CELEXA) 10 MG tablet TAKE 2 TABLETS (20MG) BY MOUTH EVERY DAY 112 tablet 0  . gabapentin (NEURONTIN) 300 MG capsule TAKE ONE CAPSULE BY MOUTH 3 TIMES A DAY 90 capsule 2  . glipiZIDE (GLUCOTROL) 5 MG tablet Take 10 mg in the morning and 5 mg in the evening (Patient taking differently: Take 5-10 mg by mouth See admin instructions. Take 10 mg in the morning and 5 mg in the evening) 90 tablet 2  . metFORMIN (GLUCOPHAGE) 1000 MG tablet TAKE ONE TABLET BY MOUTH 2 TIMES A DAY  WITH A MEAL 60 tablet 3  . apixaban (ELIQUIS) 5 MG TABS tablet TAKE 2 TABLETS (10MG) TWICE DAILY FOR 7 DAYS, THEN 1 TABLET (5MG) TWICE DAILY 74 tablet 3  . blood glucose meter kit and supplies KIT Dispense based on patient and insurance preference. Use up to four times daily as directed. (FOR ICD-9 250.00, 250.01). 1 each 0  . nicotine (NICODERM CQ - DOSED IN MG/24 HOURS) 21 mg/24hr patch Place 1 patch (21 mg total) onto the skin daily. (Patient not taking: Reported on 09/28/2020) 28 patch 0  . triamcinolone (KENALOG) 0.025 % ointment APPLY ONE APPLICATION TOPICALLY 2 TIMES A DAY (Patient not taking: Reported on 09/28/2020) 30 g 0  . amoxicillin-clavulanate (AUGMENTIN) 875-125 MG tablet TAKE ONE TABLET BY MOUTH 2 TIMES A DAY FOR 14 DAYS (  Patient not taking: Reported on 09/28/2020) 28 tablet 0  . citalopram (CELEXA) 20 MG tablet TAKE ONE TABLET BY MOUTH EVERY DAY (Patient taking differently: Take 20 mg by mouth daily.) 30 tablet 0  . cloNIDine (CATAPRES) 0.1 MG tablet TAKE ONE TABLET BY MOUTH AT BEDTIME (Patient taking differently: Take 0.1 mg by mouth at bedtime.) 56 tablet 0   No facility-administered medications prior to visit.    Allergies  Allergen Reactions  . Bee Venom Anaphylaxis  . Coffea Arabica Shortness Of Breath  . Coffee Flavor Shortness Of Breath  . Cheese Swelling    Blue Cheese only    ROS Review of Systems  Constitutional: Negative.   Respiratory: Positive for shortness of breath.   Cardiovascular: Negative.   Endocrine: Negative.   Neurological: Negative.   Psychiatric/Behavioral: Negative.       Objective:    Physical Exam HENT:     Head: Normocephalic and atraumatic.  Cardiovascular:     Rate and Rhythm: Normal rate and regular rhythm.     Pulses: Normal pulses.     Heart sounds: Normal heart sounds.  Pulmonary:     Effort: Pulmonary effort is normal.     Breath sounds: Normal breath sounds.  Skin:    General: Skin is warm.  Neurological:     General:  No focal deficit present.     Mental Status: He is alert and oriented to person, place, and time. Mental status is at baseline.  Psychiatric:        Mood and Affect: Mood normal.        Behavior: Behavior normal.        Thought Content: Thought content normal.        Judgment: Judgment normal.     BP 110/71 (BP Location: Left Arm, Patient Position: Sitting, Cuff Size: Large)   Pulse 93   Temp (!) 97 F (36.1 C)   Resp 18   Wt 196 lb 1.6 oz (89 kg)   SpO2 98% Comment: 3 L of oxygen  BMI 28.14 kg/m  Wt Readings from Last 3 Encounters:  09/28/20 196 lb 1.6 oz (89 kg)  09/21/20 194 lb (88 kg)  09/07/20 191 lb 6.4 oz (86.8 kg)     Health Maintenance Due  Topic Date Due  . COVID-19 Vaccine (1) Never done  . OPHTHALMOLOGY EXAM  Never done  . TETANUS/TDAP  Never done    There are no preventive care reminders to display for this patient.  Lab Results  Component Value Date   TSH 1.720 10/28/2019   Lab Results  Component Value Date   WBC 5.8 08/31/2020   HGB 10.8 (L) 08/31/2020   HCT 31.6 (L) 08/31/2020   MCV 91.6 08/31/2020   PLT 420 (H) 08/31/2020   Lab Results  Component Value Date   NA 130 (L) 08/31/2020   K 4.8 08/31/2020   CO2 24 08/31/2020   GLUCOSE 358 (H) 08/31/2020   BUN 15 08/31/2020   CREATININE 0.86 08/31/2020   BILITOT 0.5 08/31/2020   ALKPHOS 96 08/31/2020   AST 17 08/31/2020   ALT 13 08/31/2020   PROT 7.9 08/31/2020   ALBUMIN 3.0 (L) 08/31/2020   CALCIUM 8.8 (L) 08/31/2020   ANIONGAP 10 08/31/2020   Lab Results  Component Value Date   CHOL 167 06/22/2020   Lab Results  Component Value Date   HDL 44 06/22/2020   Lab Results  Component Value Date   LDLCALC 70 06/22/2020   Lab Results  Component Value Date   TRIG 335 (H) 06/22/2020   Lab Results  Component Value Date   CHOLHDL 3.8 06/22/2020   Lab Results  Component Value Date   HGBA1C 8.3 (H) 09/21/2020      Assessment & Plan:    1. Type 2 diabetes mellitus with diabetic  neuropathy, without long-term current use of insulin (HCC) - His HgbA1c was 8.3% and his goal should be less than 7%. He was encouraged to check at least his fasting blood glucose daily and his goal should be between 80-130 mg/dl. He will continue on his Metformin and Glipizide was increase to 10 mg bid, low carb/ non concentrated sweet diet. - POCT Glucose (CBG); Future - metFORMIN (GLUCOPHAGE) 1000 MG tablet; TAKE ONE TABLET BY MOUTH 2 TIMES A DAY WITH A MEAL  Dispense: 60 tablet; Refill: 3 - glipiZIDE (GLUCOTROL) 10 MG tablet; Take 1 tablet (10 mg total) by mouth 2 (two) times daily before a meal.  Dispense: 60 tablet; Refill: 2 - gabapentin (NEURONTIN) 400 MG capsule; Take 1 capsule (400 mg total) by mouth 3 (three) times daily.  Dispense: 90 capsule; Refill: 2 - HgB A1c; Future - POCT Glucose (CBG)  2. Other polyneuropathy - His peripheral neuropathy is not controlled and it was increased to 400 mg tid. - gabapentin (NEURONTIN) 400 MG capsule; Take 1 capsule (400 mg total) by mouth 3 (three) times daily.  Dispense: 90 capsule; Refill: 2  3. Post-traumatic stress disorder, unspecified - He will continue on current medication and provided with [name] information to schedule and appointment and to call the Crisis help line with worsening symptoms. - citalopram (CELEXA) 20 MG tablet; Take 1 tablet (20 mg total) by mouth daily.  Dispense: 30 tablet; Refill: 0 - cloNIDine (CATAPRES) 0.1 MG tablet; Take 1 tablet (0.1 mg total) by mouth at bedtime.  Dispense: 56 tablet; Refill: 0    Follow-up: Return in about 3 months (around 12/29/2020), or if symptoms worsen or fail to improve.    Aislee Landgren Jerold Coombe, NP

## 2020-09-28 NOTE — Patient Instructions (Signed)

## 2020-09-29 ENCOUNTER — Telehealth: Payer: Self-pay | Admitting: Pharmacist

## 2020-09-29 ENCOUNTER — Other Ambulatory Visit: Payer: Self-pay

## 2020-09-29 LAB — GLUCOSE, POCT (MANUAL RESULT ENTRY): POC Glucose: 189 mg/dl — AB (ref 70–99)

## 2020-09-29 NOTE — Telephone Encounter (Signed)
PATIENT [name]. TILL 09/16/2021-AJ

## 2020-10-01 LAB — ACID FAST CULTURE WITH REFLEXED SENSITIVITIES (MYCOBACTERIA): Acid Fast Culture: NEGATIVE

## 2020-10-07 ENCOUNTER — Encounter: Payer: Self-pay | Admitting: Pulmonary Disease

## 2020-10-11 ENCOUNTER — Encounter: Payer: Self-pay | Admitting: Physician Assistant

## 2020-10-11 NOTE — Telephone Encounter (Signed)
Attempted to schedule no ans no vm.    Mailed letter and closing encounter .

## 2020-10-14 ENCOUNTER — Other Ambulatory Visit: Payer: Self-pay

## 2020-10-14 MED FILL — Albuterol Sulfate Inhal Aero 108 MCG/ACT (90MCG Base Equiv): RESPIRATORY_TRACT | 50 days supply | Qty: 8.5 | Fill #0 | Status: AC

## 2020-10-20 ENCOUNTER — Ambulatory Visit (INDEPENDENT_AMBULATORY_CARE_PROVIDER_SITE_OTHER): Payer: Self-pay | Admitting: Pulmonary Disease

## 2020-10-20 ENCOUNTER — Other Ambulatory Visit: Payer: Self-pay

## 2020-10-20 ENCOUNTER — Encounter: Payer: Self-pay | Admitting: Pulmonary Disease

## 2020-10-20 VITALS — BP 120/62 | HR 74 | Temp 97.1°F | Ht 74.0 in | Wt 200.2 lb

## 2020-10-20 DIAGNOSIS — J9691 Respiratory failure, unspecified with hypoxia: Secondary | ICD-10-CM

## 2020-10-20 DIAGNOSIS — J869 Pyothorax without fistula: Secondary | ICD-10-CM

## 2020-10-20 DIAGNOSIS — F17210 Nicotine dependence, cigarettes, uncomplicated: Secondary | ICD-10-CM

## 2020-10-20 DIAGNOSIS — J449 Chronic obstructive pulmonary disease, unspecified: Secondary | ICD-10-CM

## 2020-10-20 MED ORDER — STIOLTO RESPIMAT 2.5-2.5 MCG/ACT IN AERS
2.0000 | INHALATION_SPRAY | Freq: Every day | RESPIRATORY_TRACT | 11 refills | Status: DC
  Filled 2020-10-20: qty 4, fill #0

## 2020-10-20 MED ORDER — STIOLTO RESPIMAT 2.5-2.5 MCG/ACT IN AERS: 2.0000 | INHALATION_SPRAY | Freq: Every day | RESPIRATORY_TRACT | 0 refills | Status: DC

## 2020-10-20 MED FILL — Apixaban Tab 5 MG: ORAL | 15 days supply | Qty: 30 | Fill #0 | Status: AC

## 2020-10-20 MED FILL — Rosuvastatin Calcium Tab 5 MG: ORAL | 30 days supply | Qty: 30 | Fill #0 | Status: AC

## 2020-10-20 NOTE — Progress Notes (Deleted)
Cardiology Office Note    Date:  10/20/2020   ID:  [name], DOB May 12, 1961, MRN [id number]  PCP:  Rolm Gala, NP  Cardiologist:  Debbe Odea, MD  Electrophysiologist:  None   Chief Complaint: Follow-up  History of Present Illness:   Timothy [name]. is a 60 y.o. male with history of bacterial empyema in 07/2020, COPD with ongoing tobacco use of 40+ years, DM2, HTN, and HLD who presents for follow-up of Lexiscan.  He has been followed by pulmonology for ongoing shortness of breath with underlying COPD and ongoing tobacco use.  Echo obtained by them in 05/2020 showed an EF of 60 to 65%, no regional wall motion abnormalities, normal LV diastolic function parameters, normal RV systolic function and ventricular cavity size, and a mildly dilated left atrium.  PFTs demonstrated moderate to severe COPD.  Prior lung cancer screening CT demonstrated coronary artery calcifications and in this setting he was referred to our office and evaluated by Dr. Azucena Cecil on 08/08/2020 with a 58-month history of progressive shortness of breath sometimes associated with chest discomfort.  It was noted he had previously been started on Entresto by the [facility] CHF clinic, though never started this medication.  BP at his visit with our office was soft at 98/64 with an O2 saturation of 87% on room air.  It was recommended Entresto be discontinued in the setting of soft BP and preserved LV systolic function with normal diastolic function noted on echo as above.  He was scheduled for [facility] MPI, though this was delayed as he was admitted to the hospital for sepsis with acute hypoxic respiratory failure secondary to Streptococcus intermedius empyema status post VATS with left-sided decortication/chest tube placement.  Hospital admission was complicated by left soleal DVT with recommendation to hold long-term Plavix and treat with Eliquis.  During this admission high-sensitivity troponin was  normal x2.  BNP 222.  He underwent Lexiscan MPI on 10/25/2020 which showed no evidence of ischemia with an EF of 55 to 65% and was overall a low risk study.  ***   Labs independently reviewed: 08/2020 - potassium 4.3, BUN 6, serum creatinine 0.86, albumin 2.1, AST 14, ALT normal, Hgb 10.8, PLT 586 06/2020 - A1c 7.7, TC 167, TG 335, HDL 44, LDL 70 10/2019 -TSH normal   Past Medical History:  Diagnosis Date  . CHF (congestive heart failure) (HCC)   . Diabetes mellitus without complication (HCC)   . Hypercholesteremia   . Hypertension     Past Surgical History:  Procedure Laterality Date  . AMPUTATION Left 03/22/2020   Procedure: AMPUTATION RAY-1st Ray Left Foot;  Surgeon: Linus Galas, [name];  Location: [facility] ORS;  Service: Podiatry;  Laterality: Left;  . AMPUTATION TOE Left 03/16/2020   Procedure: AMPUTATION GREAT TOE;  Surgeon: Linus Galas, [name];  Location: [facility] ORS;  Service: Podiatry;  Laterality: Left;  . COLONOSCOPY WITH PROPOFOL N/A 12/22/2019   Procedure: COLONOSCOPY WITH PROPOFOL;  Surgeon: Midge Minium, MD;  Location: [facility] ENDOSCOPY;  Service: Endoscopy;  Laterality: N/A;  . LOWER EXTREMITY ANGIOGRAPHY Left 03/18/2020   Procedure: Lower Extremity Angiography;  Surgeon: Renford Dills, MD;  Location: [facility] INVASIVE CV LAB;  Service: Cardiovascular;  Laterality: Left;  . TONSILLECTOMY    . VIDEO ASSISTED THORACOSCOPY (VATS)/DECORTICATION Left 08/15/2020   Procedure: VIDEO ASSISTED THORACOSCOPY (VATS)/DECORTICATION;  Surgeon: Corliss Skains, MD;  Location: [facility] OR;  Service: Thoracic;  Laterality: Left;  . WISDOM TOOTH EXTRACTION      Current  Medications: No outpatient medications have been marked as taking for the 10/28/20 encounter (Appointment) with Sondra Barges, PA-C.    Allergies:   Bee venom, Coffea arabica, Coffee flavor, and Cheese   Social History   Socioeconomic History  . Marital status: Single    Spouse name: Not on file  . Number of children: 0  . Years of  education: Not on file  . Highest education level: Not on file  Occupational History  . Occupation: Unemployed  Tobacco Use  . Smoking status: Current Every Day Smoker    Packs/day: 2.00    Years: 41.00    Pack years: 82.00    Types: Cigarettes  . Smokeless tobacco: Never Used  . Tobacco comment: 1PPD 07/28/2020  Vaping Use  . Vaping Use: Never used  Substance and Sexual Activity  . Alcohol use: Yes    Alcohol/week: 6.0 standard drinks    Types: 6 Cans of beer per week    Comment: occasionally   . Drug use: Not Currently  . Sexual activity: Not Currently  Other Topics Concern  . Not on file  Social History Narrative  . Not on file   Social Determinants of Health   Financial Resource Strain: Medium Risk  . Difficulty of Paying Living Expenses: Somewhat hard  Food Insecurity: No Food Insecurity  . Worried About Programme researcher, broadcasting/film/video in the Last Year: Never true  . Ran Out of Food in the Last Year: Never true  Transportation Needs: No Transportation Needs  . Lack of Transportation (Medical): No  . Lack of Transportation (Non-Medical): No  Physical Activity: Inactive  . Days of Exercise per Week: 0 days  . Minutes of Exercise per Session: 0 min  Stress: Stress Concern Present  . Feeling of Stress : Rather much  Social Connections: Unknown  . Frequency of Communication with Friends and Family: Three times a week  . Frequency of Social Gatherings with Friends and Family: Never  . Attends Religious Services: Never  . Active Member of Clubs or Organizations: No  . Attends Banker Meetings: Never  . Marital Status: Not on file     Family History:  The patient's Family history is unknown by patient.  ROS:   ROS   EKGs/Labs/Other Studies Reviewed:    Studies reviewed were summarized above. The additional studies were reviewed today:  2D echo 05/2020: 1. Left ventricular ejection fraction, by estimation, is 60 to 65%. The  left ventricle has normal  function. The left ventricle has no regional  wall motion abnormalities. Left ventricular diastolic parameters were  normal.  2. Right ventricular systolic function is normal. The right ventricular  size is normal. Tricuspid regurgitation signal is inadequate for assessing  PA pressure.  3. Left atrial size was mildly dilated.  __________  [facility] MPI 10/25/2020:  There was no ST segment deviation noted during stress.  The study is normal.  This is a low risk study.  The left ventricular ejection fraction is normal (55-65%).  There is no evidence for ischemia    EKG:  EKG is ordered today.  The EKG ordered today demonstrates ***  Recent Labs: 10/28/2019: TSH 1.720 08/10/2020: B Natriuretic Peptide 222.7 08/31/2020: ALT 13; BUN 15; Creatinine, Ser 0.86; Hemoglobin 10.8; Platelets 420; Potassium 4.8; Sodium 130  Recent Lipid Panel    Component Value Date/Time   CHOL 167 06/22/2020 1058   TRIG 335 (H) 06/22/2020 1058   HDL 44 06/22/2020 1058   CHOLHDL 3.8 06/22/2020  1058   LDLCALC 70 06/22/2020 1058    PHYSICAL EXAM:    VS:  There were no vitals taken for this visit.  BMI: There is no height or weight on file to calculate BMI.  Physical Exam  Wt Readings from Last 3 Encounters:  09/28/20 196 lb 1.6 oz (89 kg)  09/21/20 194 lb (88 kg)  09/07/20 191 lb 6.4 oz (86.8 kg)     ASSESSMENT & PLAN:   1. Shortness of breath:  2. Coronary artery calcification:  3. HTN: Blood pressure ***  4. HLD: LDL 70 in 06/2020.  ***  5. History of empyema status post VATS with decortication/COPD/ongoing tobacco use: ***  Disposition: F/u with Dr. Azucena Cecil or an APP in ***.   Medication Adjustments/Labs and Tests Ordered: Current medicines are reviewed at length with the patient today.  Concerns regarding medicines are outlined above. Medication changes, Labs and Tests ordered today are summarized above and listed in the Patient Instructions accessible in Encounters.    Signed, Eula Listen, PA-C 10/20/2020 3:29 PM     [facility] -  [address] [place], [place] [address] [phone]

## 2020-10-20 NOTE — Progress Notes (Signed)
Subjective:    Patient ID: [name], male    DOB: [birth year], 60 y.o.   MRN: [id number] Chief Complaint  Patient presents with  . Follow-up    COPD: breathing not better. Recently had pneumonia and pleurisy.  Sob, coughing up phlegm occasional white and yellow. Wheezing when laying.     HPI Patient is a 60 year old current smoker (half PPD) who presents for follow-up on COPD and dyspnea.  This is a scheduled appointment.  He was initially evaluated here in December 2021 with a subsequent visit in July 28, 2020.  At that time he had been on Trelegy Ellipta.  On February 23 he noticed that he had been having fevers chills increased shortness of breath and presented to the emergency room.  He was noted to have a large left pleural effusion that was loculated.  Empyema was confirmed and patient was transferred to [facility] for definitive surgical treatment.  He underwent video-assisted thoracoscopy with VATS decortication on 28 February.  He was discharged from [facility] on 8 March.  He did receive IV antibiotics as the organism was Streptococcus intermedius.  He was discharged on 3 L nasal cannula O2.  Since that time he has been decreasing his use of O2.  He actually presented to the clinic without O2 today.  He continues to have issues with shortness of breath.  His most recent chest x-ray shows significant pleural scarring on the left.  This will of course worsen his already compromised lung function.  Additionally he is going to have a stress test on 10 May 2 complete cardiac evaluation.  Some of his dyspnea may be related to cardiac disease.  He unfortunately continues to smoke half pack of cigarettes per day.  DATA: 06/16/2020 PFT: FEV1 1.54 L or 42% predicted, FVC 2.54 L or 52% predicted, FEV1/FVC 61%, there is bronchodilator response with net change of 14% on FEV1 postbronchodilator.  There is concomitant restrictive physiology.  There is fluttering of the flow-volume loop  consistent with possible OSA.  Diffusion capacity moderately reduced. 06/17/2019 one 2D echo: LVEF 60 to 65%, mildly dilated LA, no diastolic dysfunction. 08/21/2020 chest CT: Residual left empyema/pleural rind  Review of Systems A 10 point review of systems was performed and it is as noted above otherwise negative.  Patient Active Problem List   Diagnosis Date Noted  . Empyema lung ([place]) 08/15/2020  . Type 2 diabetes mellitus with hyperglycemia ([place]) 08/11/2020  . Respiratory failure with hypoxia ([place]) 08/11/2020  . Hyponatremia 08/11/2020  . Chronic diastolic CHF (congestive heart failure) ([place]) 08/11/2020  . Anemia 08/11/2020  . Abdominal pain 08/11/2020  . Empyema ([place]) 08/10/2020  . Osteomyelitis ([place]) 03/15/2020  . Open wound of foot 01/20/2020  . Elevated lipids 01/20/2020  . Personal history of colonic polyps   . Shortness of breath 11/04/2019  . Encounter to establish care 10/15/2019  . Essential hypertension 10/15/2019  . Controlled diabetes mellitus type 2 with complications ([place]) 85/63/1497  . Smoking 10/15/2019  . Peripheral neuropathy 10/15/2019  . Cramp, abdominal 10/15/2019  . History of hepatitis 10/15/2019  . History of gastroesophageal reflux (GERD) 10/15/2019  . Type 2 diabetes mellitus with diabetic neuropathy, unspecified ([place]) 10/15/2019  . Post-traumatic stress disorder, unspecified 07/14/2019  . Antisocial personality disorder ([place]) 07/14/2019   Social History   Tobacco Use  . Smoking status: Current Every Day Smoker    Packs/day: 0.50    Years: 41.00    Pack years: 20.50  Types: Cigarettes  . Smokeless tobacco: Never Used  . Tobacco comment: .5 ppd 10/20/2020  Substance Use Topics  . Alcohol use: Yes    Alcohol/week: 6.0 standard drinks    Types: 6 Cans of beer per week    Comment: occasionally    Allergies  Allergen Reactions  . Bee Venom Anaphylaxis  . Coffea Arabica Shortness Of Breath  . Coffee Flavor Shortness Of Breath  . Cheese  Swelling    Blue Cheese only   Current Meds  Medication Sig  . Acetaminophen (ARTHRITIS PAIN PO) Take 1,000 mg by mouth daily.  Marland Kitchen albuterol (VENTOLIN HFA) 108 (90 Base) MCG/ACT inhaler INHALE ONE PUFF INTO THE LUNGS EVERY 6 HOURS AS NEEDED FOR WHEEZING OR SHORTNESS OF BREATH.  Marland Kitchen amLODipine (NORVASC) 5 MG tablet TAKE ONE TABLET BY MOUTH EVERY DAY  . apixaban (ELIQUIS) 5 MG TABS tablet TAKE 2 TABLETS (10MG) TWICE DAILY FOR 7 DAYS, THEN 1 TABLET (5MG) TWICE DAILY  . blood glucose meter kit and supplies KIT Dispense based on patient and insurance preference. Use up to four times daily as directed. (FOR ICD-9 250.00, 250.01).  . citalopram (CELEXA) 20 MG tablet Take 1 tablet (20 mg total) by mouth daily.  . cloNIDine (CATAPRES) 0.1 MG tablet Take 1 tablet (0.1 mg total) by mouth at bedtime.  . Fluticasone-Umeclidin-Vilant 100-62.5-25 MCG/INH AEPB INHALE 1 PUFF INTO THE LUNGS EVERY DAY  . gabapentin (NEURONTIN) 400 MG capsule Take 1 capsule (400 mg total) by mouth 3 (three) times daily.  Marland Kitchen glipiZIDE (GLUCOTROL) 10 MG tablet Take 1 tablet (10 mg total) by mouth 2 (two) times daily before a meal.  . Magnesium 400 MG CAPS Take 400 mg by mouth daily.  . metFORMIN (GLUCOPHAGE) 1000 MG tablet TAKE ONE TABLET BY MOUTH 2 TIMES A DAY WITH A MEAL  . nicotine (NICODERM CQ - DOSED IN MG/24 HOURS) 21 mg/24hr patch Place 1 patch (21 mg total) onto the skin daily.  . pantoprazole (PROTONIX) 40 MG tablet TAKE ONE TABLET BY MOUTH EVERY DAY  . rosuvastatin (CRESTOR) 5 MG tablet TAKE ONE TABLET BY MOUTH EVERY DAY  . TRELEGY ELLIPTA 100-62.5-25 MCG/INH AEPB INHALE 1 PUFF INTO THE LUNGS EVERY DAY  . triamcinolone (KENALOG) 0.025 % ointment APPLY ONE APPLICATION TOPICALLY 2 TIMES A DAY      Objective:   Physical Exam BP 120/62 (BP Location: Left Arm, Patient Position: Sitting, Cuff Size: Normal)   Pulse 74   Temp (!) 97.1 F (36.2 C) (Temporal)   Ht _0  (1.88 m)   Wt 200 lb 3.2 oz (90.8 kg)   SpO2 98%   BMI  25.70 kg/m  GENERAL: Well-developed well-nourished gentleman in no acute distress. Fully ambulatory. HEAD: Normocephalic, atraumatic.  EYES: Pupils equal, round, reactive to light. No scleral icterus. Injected conjunctiva. MOUTH: Nose/mouth/throat not examined due to masking requirements for COVID 19. NECK: Supple. No thyromegaly. Trachea midline. No JVD. No adenopathy. PULMONARY: Good air entry symmetrically bilaterally.   Some rhonchi noted, no wheezes. CARDIOVASCULAR: S1 and S2. Regular rate and rhythm. No rubs, murmurs or gallops heard. ABDOMEN: Benign. MUSCULOSKELETAL: No clubbing, no edema.  NEUROLOGIC: No focal deficit, speech is fluent, SKIN: Intact,warm,dry. PSYCH: Flat affect, normal behavior.  Ambulatory oximetry: No desaturations with ambulation on RA.  Patient maintained saturations at 97 to 98%.    Assessment & Plan:     ICD-10-CM   1. COPD, moderate ([place]) -moderate to severe  J44.9    We will discontinue Trelegy [employer]  2 puffs daily Samples and instruction provided to the patient Prescription sent to his pharmacy  2. Respiratory failure with hypoxia, unspecified chronicity (HCC)  J96.91    Currently doing well on RA Can be off oxygen during the day and with ambulation Continue oxygen at nighttime until reassessed again  3. Empyema lung (HCC)  J86.9    Streptococcus immitis Had VATS pleurodesis Had prolonged antibiotic therapy IV Residual pleural scarring, expected  4. Tobacco dependence due to cigarettes  F17.210    Patient was counseled with regards to discontinuation of smoking Total counseling time 3 to 5 minutes   Meds ordered this encounter  Medications  . Tiotropium Bromide-Olodaterol (STIOLTO RESPIMAT) 2.5-2.5 MCG/ACT AERS    Sig: Inhale 2 puffs into the lungs daily.    Dispense:  4 g    Refill:  0    Order Specific Question:   Lot Number?    Answer:   [id number]    Order Specific Question:   Expiration Date?    Answer:   01/16/2022     Order Specific Question:   Quantity    Answer:   3   Patient will be seen in follow-up in 4-6 weeks time with either me or the nurse practitioner.

## 2020-10-20 NOTE — Patient Instructions (Signed)
We are switching your Trelegy inhaler to Stiolto Respimat 2 puffs once a day.  Do not take Trelegy anymore.  We have provided you samples of the Stiolto and will send in a prescription for you.   Some of your shortness of breath may be related to your heart and it is a good idea that you proceed with the chemical stress test that they are going to do.   We will see him in follow-up in 4 to 6 weeks time with either me or the nurse practitioner.

## 2020-10-21 ENCOUNTER — Other Ambulatory Visit: Payer: Self-pay

## 2020-10-25 ENCOUNTER — Encounter: Payer: Self-pay | Admitting: Nurse Practitioner

## 2020-10-25 ENCOUNTER — Encounter
Admission: RE | Admit: 2020-10-25 | Discharge: 2020-10-25 | Disposition: A | Discharge status: Home / Self Care | Payer: Medicaid Other | Source: Ambulatory Visit | Attending: Cardiology | Admitting: Cardiology

## 2020-10-25 ENCOUNTER — Other Ambulatory Visit: Payer: Self-pay

## 2020-10-25 DIAGNOSIS — R0602 Shortness of breath: Secondary | ICD-10-CM | POA: Diagnosis not present

## 2020-10-25 DIAGNOSIS — R0600 Dyspnea, unspecified: Secondary | ICD-10-CM | POA: Diagnosis present

## 2020-10-25 LAB — NM MYOCAR MULTI W/SPECT W/WALL MOTION / EF
Estimated workload: 1 METS
Exercise duration (min): 0 min
Exercise duration (sec): 0 s
LV dias vol: 81 mL (ref 62–150)
LV sys vol: 27 mL
MPHR: 162 {beats}/min
Peak HR: 91 {beats}/min
Percent HR: 56 %
Rest HR: 71 {beats}/min
SDS: 0
SRS: 4
SSS: 0
TID: 0.94

## 2020-10-25 MED ORDER — REGADENOSON 0.4 MG/5ML IV SOLN
0.4000 mg | Freq: Once | INTRAVENOUS | Status: AC
  Administered 2020-10-25: 0.4 mg via INTRAVENOUS

## 2020-10-25 MED ORDER — TECHNETIUM TC 99M TETROFOSMIN IV KIT
28.9000 | PACK | Freq: Once | INTRAVENOUS | Status: AC | PRN
  Administered 2020-10-25: 28.9 via INTRAVENOUS

## 2020-10-25 MED ORDER — TECHNETIUM TC 99M TETROFOSMIN IV KIT
10.0000 | PACK | Freq: Once | INTRAVENOUS | Status: AC | PRN
  Administered 2020-10-25: 9.67 via INTRAVENOUS

## 2020-10-28 ENCOUNTER — Ambulatory Visit: Payer: Self-pay | Admitting: Physician Assistant

## 2020-11-04 ENCOUNTER — Other Ambulatory Visit: Payer: Self-pay

## 2020-11-07 ENCOUNTER — Other Ambulatory Visit: Payer: Self-pay

## 2020-11-07 NOTE — Progress Notes (Signed)
Cardiology Office Note    Date:  11/08/2020   ID:  Timothy Dunaj., DOB August 21, 1960, MRN [id number]  PCP:  Langston Reusing, NP  Cardiologist:  Kate Sable, MD  Electrophysiologist:  None   Chief Complaint: Follow-up  History of Present Illness:   Timothy Leatham. is a 60 y.o. male with history of bacterial empyema in 07/2020, COPD with ongoing tobacco use of 40+ years, DM2, HTN, and HLD who presents for follow-up of Lexiscan.  He has been followed by pulmonology for ongoing shortness of breath with underlying COPD and ongoing tobacco use.  Echo obtained by them in 05/2020 showed an EF of 60 to 65%, no regional wall motion abnormalities, normal LV diastolic function parameters, normal RV systolic function and ventricular cavity size, and a mildly dilated left atrium.  PFTs demonstrated moderate to severe COPD.  Prior lung cancer screening CT demonstrated coronary artery calcifications and in this setting he was referred to our office and evaluated by Dr. Garen Lah on 08/08/2020 with a 70-monthhistory of progressive shortness of breath sometimes associated with chest discomfort.  It was noted he had [facility], though never started this medication.  BP at his visit with our office was soft at 98/64 with an O2 saturation of 87% on room air.  It was recommended Entresto be discontinued in the setting of soft BP and preserved LV systolic function with normal diastolic function noted on echo as above.  [facility] though [facility] was [facility] as he was admitted to the hospital for sepsis with acute hypoxic respiratory failure secondary to Streptococcus intermedius empyema status post VATS with left-sided decortication/chest tube placement.  Hospital admission was complicated by left soleal DVT with recommendation to hold long-term Plavix and treat with Eliquis.  During this admission high-sensitivity troponin was  normal x2.  BNP 222.  He underwent Lexiscan MPI on 10/25/2020 which showed no evidence of ischemia with an EF of 55 to 65% and was overall a low risk study.  He comes in continuing to note exertional shortness of breath and chest discomfort that is improved when compared to his initial visit with uKoreain 07/2020.  He continues to feel like there is "infection in my lungs."  He does not feel like his new inhalers are helping.  No lower extremity swelling, abdominal distention, worsening orthopnea, PND, early satiety.  He is now smoking less than 1/2 pack/day.  He occasionally socially drinks.  He did have some increased stress at home earlier this morning which he feels like contributed to his initial BP being elevated.  He does not have any cardiac issues or concerns at this time.  Prior notes indicate history of CHF with most recent echo demonstrating preserved LV systolic function and normal LV diastolic function.   Labs independently reviewed: 09/2020 - A1c 8.3 08/2020 - potassium 4.8, BUN 15, serum creatinine 0.86, albumin 3.0, AST/ALT normal, Hgb 10.8, PLT 420 06/2020 - A1c 7.7, TC 167, TG 335, HDL 44, LDL 70 10/2019 -TSH normal   Past Medical History:  Diagnosis Date  . Diabetes mellitus without complication ([facility]   . Hypercholesteremia   . Hypertension     Past Surgical History:  Procedure Laterality Date  . AMPUTATION Left 03/22/2020   Procedure: AMPUTATION RAY-1st Ray Left Foot;  Surgeon: CSharlotte Alamo [name];  Location: [facility] ORS;  Service: Podiatry;  Laterality: Left;  . AMPUTATION TOE Left 03/16/2020  Procedure: AMPUTATION GREAT TOE;  Surgeon: [name], [name];  Location: [facility] ORS;  Service: Podiatry;  Laterality: Left;  . COLONOSCOPY WITH PROPOFOL N/A 12/22/2019   Procedure: COLONOSCOPY WITH PROPOFOL;  Surgeon: [name], MD;  Location: [facility] ENDOSCOPY;  Service: Endoscopy;  Laterality: N/A;  . LOWER EXTREMITY ANGIOGRAPHY Left 03/18/2020   Procedure: Lower Extremity Angiography;  Surgeon:  [name], MD;  Location: [facility];  Service: Cardiovascular;  Laterality: Left;  . TONSILLECTOMY    . VIDEO ASSISTED THORACOSCOPY (VATS)/DECORTICATION Left 08/15/2020   Procedure: VIDEO ASSISTED THORACOSCOPY (VATS)/DECORTICATION;  Surgeon: [name], MD;  Location: [facility];  Service: Thoracic;  Laterality: Left;  . WISDOM TOOTH EXTRACTION      Current Medications: Current Meds  Medication Sig  . Acetaminophen (ARTHRITIS PAIN PO) Take 1,000 mg by mouth as needed.  Marland Kitchen albuterol (VENTOLIN HFA) 108 (90 Base) MCG/ACT inhaler INHALE ONE PUFF INTO THE LUNGS EVERY 6 HOURS AS NEEDED FOR WHEEZING OR SHORTNESS OF BREATH.  Marland Kitchen amLODipine (NORVASC) 5 MG tablet TAKE ONE TABLET BY MOUTH EVERY DAY  . apixaban (ELIQUIS) 5 MG TABS tablet TAKE 2 TABLETS (10MG) TWICE DAILY FOR 7 DAYS, THEN 1 TABLET (5MG) TWICE DAILY  . blood glucose meter kit and supplies KIT Dispense based on patient and insurance preference. Use up to four times daily as directed. (FOR ICD-9 250.00, 250.01).  . citalopram (CELEXA) 20 MG tablet Take 1 tablet (20 mg total) by mouth daily.  . cloNIDine (CATAPRES) 0.1 MG tablet Take 1 tablet (0.1 mg total) by mouth at bedtime.  . gabapentin (NEURONTIN) 400 MG capsule Take 1 capsule (400 mg total) by mouth 3 (three) times daily.  Marland Kitchen glipiZIDE (GLUCOTROL) 10 MG tablet Take 1 tablet (10 mg total) by mouth 2 (two) times daily before a meal.  . Magnesium 400 MG CAPS Take 400 mg by mouth daily.  . metFORMIN (GLUCOPHAGE) 1000 MG tablet TAKE ONE TABLET BY MOUTH 2 TIMES A DAY WITH A MEAL  . pantoprazole (PROTONIX) 40 MG tablet TAKE ONE TABLET BY MOUTH EVERY DAY  . rosuvastatin (CRESTOR) 5 MG tablet TAKE ONE TABLET BY MOUTH EVERY DAY  . [START ON 11/15/2020] Tiotropium Bromide-Olodaterol (STIOLTO RESPIMAT) 2.5-2.5 MCG/ACT AERS Inhale 2 puffs into the lungs daily.  Marland Kitchen triamcinolone (KENALOG) 0.025 % ointment APPLY ONE APPLICATION TOPICALLY 2 TIMES A DAY    Allergies:   Bee venom,  Coffea arabica, Coffee flavor, and Cheese   Social History   Socioeconomic History  . Marital status: Single    Spouse name: Not on file  . Number of children: 0  . Years of education: Not on file  . Highest education level: Not on file  Occupational History  . Occupation: Unemployed  Tobacco Use  . Smoking status: Current Every Day Smoker    Packs/day: 0.50    Years: 41.00    Pack years: 20.50    Types: Cigarettes  . Smokeless tobacco: Never Used  . Tobacco comment: .5 ppd 10/20/2020  Vaping Use  . Vaping Use: Never used  Substance and Sexual Activity  . Alcohol use: Yes    Alcohol/week: 6.0 standard drinks    Types: 6 Cans of beer per week    Comment: occasionally   . Drug use: Not Currently  . Sexual activity: Not Currently  Other Topics Concern  . Not on file  Social History Narrative  . Not on file   Social Determinants of Health   Financial Resource Strain: Medium Risk  .  Difficulty of Paying Living Expenses: Somewhat hard  Food Insecurity: No Food Insecurity  . Worried About Charity fundraiser in the Last Year: Never true  . Ran Out of Food in the Last Year: Never true  Transportation Needs: No Transportation Needs  . Lack of Transportation (Medical): No  . Lack of Transportation (Non-Medical): No  Physical Activity: Inactive  . Days of Exercise per Week: 0 days  . Minutes of Exercise per Session: 0 min  Stress: Stress Concern Present  . Feeling of Stress : Rather much  Social Connections: Unknown  . Frequency of Communication with Friends and Family: Three times a week  . Frequency of Social Gatherings with Friends and Family: Never  . Attends Religious Services: Never  . Active Member of Clubs or Organizations: No  . Attends Archivist Meetings: Never  . Marital Status: Not on file     Family History:  The patient's Family history is unknown by patient.  ROS:   Review of Systems  Constitutional: Positive for malaise/fatigue. Negative  for chills, diaphoresis, fever and weight loss.  HENT: Negative for congestion.   Eyes: Negative for discharge and redness.  Respiratory: Positive for sputum production and shortness of breath. Negative for cough and wheezing.        Brown sputum  Cardiovascular: Positive for chest pain. Negative for palpitations, orthopnea, claudication, leg swelling and PND.  Gastrointestinal: Negative for abdominal pain, blood in stool, heartburn, melena, nausea and vomiting.  Musculoskeletal: Negative for falls and myalgias.  Skin: Negative for rash.  Neurological: Negative for dizziness, tingling, tremors, sensory change, speech change, focal weakness, loss of consciousness and weakness.  Endo/Heme/Allergies: Does not bruise/bleed easily.  Psychiatric/Behavioral: Negative for substance abuse. The patient is not nervous/anxious.   All other systems reviewed and are negative.    EKGs/Labs/Other Studies Reviewed:    Studies reviewed were summarized above. The additional studies were reviewed today:  2D echo 05/2020: 1. Left ventricular ejection fraction, by estimation, is 60 to 65%. The  left ventricle has normal function. The left ventricle has no regional  wall motion abnormalities. Left ventricular diastolic parameters were  normal.  2. Right ventricular systolic function is normal. The right ventricular  size is normal. Tricuspid regurgitation signal is inadequate for assessing  PA pressure.  3. Left atrial size was mildly dilated.  __________  Carlton Adam MPI 10/25/2020:  There was no ST segment deviation noted during stress.  The study is normal.  This is a low risk study.  The left ventricular ejection fraction is normal (55-65%).  There is no evidence for ischemia   EKG:  EKG is not ordered today.    Recent Labs: 08/10/2020: B Natriuretic Peptide 222.7 08/31/2020: ALT 13; BUN 15; Creatinine, Ser 0.86; Hemoglobin 10.8; Platelets 420; Potassium 4.8; Sodium 130  Recent Lipid  Panel    Component Value Date/Time   CHOL 167 06/22/2020 1058   TRIG 335 (H) 06/22/2020 1058   HDL 44 06/22/2020 1058   CHOLHDL 3.8 06/22/2020 1058   LDLCALC 70 06/22/2020 1058    PHYSICAL EXAM:    VS:  BP 130/90 (BP Location: Left Arm, Patient Position: Sitting, Cuff Size: Large)   Pulse 72   Ht '5\' 10"'  (1.778 m)   Wt 196 lb (88.9 kg)   SpO2 98% Comment: on 3L O2  BMI 28.12 kg/m   BMI: Body mass index is 28.12 kg/m.  Physical Exam Vitals reviewed.  Constitutional:      Appearance: He is well-developed.  HENT:     Head: Normocephalic and atraumatic.  Eyes:     General:        Right eye: No discharge.        Left eye: No discharge.  Neck:     Vascular: No JVD.  Cardiovascular:     Rate and Rhythm: Normal rate and regular rhythm.     Pulses: No midsystolic click and no opening snap.          Posterior tibial pulses are 2+ on the right side and 2+ on the left side.     Heart sounds: Normal heart sounds, S1 normal and S2 normal. Heart sounds not distant. No murmur heard. No friction rub.  Pulmonary:     Effort: Pulmonary effort is normal. No respiratory distress.     Breath sounds: Decreased breath sounds present. No wheezing or rales.     Comments: Supplemental oxygen via nasal cannula noted Chest:     Chest wall: No tenderness.  Abdominal:     General: There is no distension.     Palpations: Abdomen is soft.     Tenderness: There is no abdominal tenderness.  Musculoskeletal:     Cervical back: Normal range of motion.  Skin:    General: Skin is warm and dry.     Nails: There is no clubbing.  Neurological:     Mental Status: He is alert and oriented to person, place, and time.  Psychiatric:        Speech: Speech normal.        Behavior: Behavior normal.        Thought Content: Thought content normal.        Judgment: Judgment normal.     Wt Readings from Last 3 Encounters:  11/08/20 196 lb (88.9 kg)  10/20/20 200 lb 3.2 oz (90.8 kg)  09/28/20 196 lb 1.6  oz (89 kg)     ASSESSMENT & PLAN:   1. Coronary artery calcification/HLD: Lexiscan MPI with no evidence of ischemia.  LDL 70 in 06/2020.  He remains on rosuvastatin which is followed by PCP.  Risk factor modification including complete smoking cessation is recommended.  2. HTN: Blood pressure initially elevated at 130/90 at triage with repeat improved at 118/74.  He remains on amlodipine 5 mg daily.  Low-sodium diet recommended.  3. Chronic hypoxic respiratory failure with history of empyema status post VATS with decortication/COPD/ongoing tobacco use: Cardiac work-up has been unrevealing.  Recommend he discuss ongoing exertional dyspnea with PCP and pulmonology.  Continues to use supplemental oxygen.  Follow-up with PCP and pulmonology as directed.  4. History of DVT: He remains on Eliquis.  Management per PCP.  Disposition: F/u with Dr. Garen Lah or an APP in 6 months.   Medication Adjustments/Labs and Tests Ordered: Current medicines are reviewed at length with the patient today.  Concerns regarding medicines are outlined above. Medication changes, Labs and Tests ordered today are summarized above and listed in the Patient Instructions accessible in Encounters.   Signed, Christell Faith, PA-C 11/08/2020 1:52 PM     [address] [place] [place], [place] [address] [phone]

## 2020-11-08 ENCOUNTER — Other Ambulatory Visit: Payer: Self-pay | Admitting: Gerontology

## 2020-11-08 ENCOUNTER — Other Ambulatory Visit: Payer: Self-pay

## 2020-11-08 ENCOUNTER — Encounter: Payer: Self-pay | Admitting: Physician Assistant

## 2020-11-08 ENCOUNTER — Ambulatory Visit (INDEPENDENT_AMBULATORY_CARE_PROVIDER_SITE_OTHER): Payer: Medicaid Other | Admitting: Physician Assistant

## 2020-11-08 VITALS — BP 130/90 | HR 72 | Ht 70.0 in | Wt 196.0 lb

## 2020-11-08 DIAGNOSIS — Z720 Tobacco use: Secondary | ICD-10-CM

## 2020-11-08 DIAGNOSIS — I10 Essential (primary) hypertension: Secondary | ICD-10-CM

## 2020-11-08 DIAGNOSIS — I2584 Coronary atherosclerosis due to calcified coronary lesion: Secondary | ICD-10-CM

## 2020-11-08 DIAGNOSIS — I2510 Atherosclerotic heart disease of native coronary artery without angina pectoris: Secondary | ICD-10-CM | POA: Diagnosis not present

## 2020-11-08 DIAGNOSIS — E785 Hyperlipidemia, unspecified: Secondary | ICD-10-CM

## 2020-11-08 DIAGNOSIS — Z86718 Personal history of other venous thrombosis and embolism: Secondary | ICD-10-CM

## 2020-11-08 DIAGNOSIS — J9611 Chronic respiratory failure with hypoxia: Secondary | ICD-10-CM

## 2020-11-08 DIAGNOSIS — Z8709 Personal history of other diseases of the respiratory system: Secondary | ICD-10-CM

## 2020-11-08 DIAGNOSIS — F4310 Post-traumatic stress disorder, unspecified: Secondary | ICD-10-CM

## 2020-11-08 DIAGNOSIS — J449 Chronic obstructive pulmonary disease, unspecified: Secondary | ICD-10-CM

## 2020-11-08 MED FILL — Apixaban Tab 5 MG: ORAL | 30 days supply | Qty: 60 | Fill #1 | Status: AC

## 2020-11-08 MED FILL — Amlodipine Besylate Tab 5 MG (Base Equivalent): ORAL | 90 days supply | Qty: 90 | Fill #0 | Status: AC

## 2020-11-08 NOTE — Patient Instructions (Signed)
Medication Instructions:  No changes at this time.   *If you need a refill on your cardiac medications before your next appointment, please call your pharmacy*   Lab Work: None  If you have labs (blood work) drawn today and your tests are completely normal, you will receive your results only by: Marland Kitchen MyChart Message (if you have MyChart) OR . A paper copy in the mail If you have any lab test that is abnormal or we need to change your treatment, we will call you to review the results.   Testing/Procedures: None   Follow-Up: At [facility], you and your health needs are our priority.  As part of our continuing mission to provide you with exceptional heart care, we have created designated Provider Care Teams.  These Care Teams include your primary Cardiologist (physician) and Advanced Practice Providers (APPs -  Physician Assistants and Nurse Practitioners) who all work together to provide you with the care you need, when you need it.  We recommend signing up for the patient portal called "MyChart".  Sign up information is provided on this After Visit Summary.  MyChart is used to connect with patients for Virtual Visits (Telemedicine).  Patients are able to view lab/test results, encounter notes, upcoming appointments, etc.  Non-urgent messages can be sent to your provider as well.   To learn more about what you can do with MyChart, go to [email or web address].    Your next appointment:   6 month(s)  The format for your next appointment:   In Person  Provider:   You may see Debbe Odea, MD or one of the following Advanced Practice Providers on your designated Care Team:    Nicolasa Ducking, NP  Eula Listen, PA-C  Marisue Ivan, PA-C  Cadence Tancred, [place]  Gillian Shields, NP

## 2020-11-09 ENCOUNTER — Other Ambulatory Visit: Payer: Self-pay

## 2020-11-09 MED FILL — Citalopram Hydrobromide Tab 20 MG (Base Equiv): ORAL | 30 days supply | Qty: 30 | Fill #0 | Status: AC

## 2020-11-09 NOTE — Telephone Encounter (Signed)
Last refill. Must reach out to [facility] for future refills.

## 2020-11-11 ENCOUNTER — Other Ambulatory Visit: Payer: Self-pay

## 2020-11-21 ENCOUNTER — Other Ambulatory Visit: Payer: Self-pay

## 2020-11-25 ENCOUNTER — Other Ambulatory Visit: Payer: Self-pay

## 2020-12-12 ENCOUNTER — Other Ambulatory Visit: Payer: Self-pay | Admitting: Gerontology

## 2020-12-12 ENCOUNTER — Other Ambulatory Visit: Payer: Self-pay

## 2020-12-12 DIAGNOSIS — E785 Hyperlipidemia, unspecified: Secondary | ICD-10-CM

## 2020-12-12 DIAGNOSIS — F4310 Post-traumatic stress disorder, unspecified: Secondary | ICD-10-CM

## 2020-12-12 MED FILL — Pantoprazole Sodium EC Tab 40 MG (Base Equiv): ORAL | 30 days supply | Qty: 30 | Fill #0 | Status: AC

## 2020-12-12 MED FILL — Albuterol Sulfate Inhal Aero 108 MCG/ACT (90MCG Base Equiv): RESPIRATORY_TRACT | 50 days supply | Qty: 8.5 | Fill #1 | Status: AC

## 2020-12-12 MED FILL — Apixaban Tab 5 MG: ORAL | 30 days supply | Qty: 60 | Fill #2 | Status: AC

## 2020-12-13 ENCOUNTER — Other Ambulatory Visit: Payer: Self-pay

## 2020-12-13 MED ORDER — CLONIDINE HCL 0.1 MG PO TABS
0.1000 mg | ORAL_TABLET | Freq: Every day | ORAL | 0 refills | Status: DC
  Filled 2020-12-13: qty 56, 56d supply, fill #0

## 2020-12-13 MED ORDER — ROSUVASTATIN CALCIUM 5 MG PO TABS
ORAL_TABLET | Freq: Every day | ORAL | 3 refills | Status: DC
  Filled 2020-12-13: qty 20, 20d supply, fill #0

## 2020-12-14 ENCOUNTER — Other Ambulatory Visit: Payer: Self-pay

## 2020-12-14 ENCOUNTER — Ambulatory Visit (INDEPENDENT_AMBULATORY_CARE_PROVIDER_SITE_OTHER): Payer: Medicaid Other | Admitting: Primary Care

## 2020-12-14 ENCOUNTER — Encounter: Payer: Self-pay | Admitting: Primary Care

## 2020-12-14 VITALS — BP 130/82 | HR 82 | Temp 98.1°F | Ht 70.0 in | Wt 198.8 lb

## 2020-12-14 DIAGNOSIS — Z8669 Personal history of other diseases of the nervous system and sense organs: Secondary | ICD-10-CM | POA: Diagnosis not present

## 2020-12-14 DIAGNOSIS — J9691 Respiratory failure, unspecified with hypoxia: Secondary | ICD-10-CM | POA: Diagnosis not present

## 2020-12-14 DIAGNOSIS — J441 Chronic obstructive pulmonary disease with (acute) exacerbation: Secondary | ICD-10-CM

## 2020-12-14 DIAGNOSIS — F17210 Nicotine dependence, cigarettes, uncomplicated: Secondary | ICD-10-CM

## 2020-12-14 DIAGNOSIS — R0602 Shortness of breath: Secondary | ICD-10-CM

## 2020-12-14 DIAGNOSIS — J449 Chronic obstructive pulmonary disease, unspecified: Secondary | ICD-10-CM

## 2020-12-14 DIAGNOSIS — F17200 Nicotine dependence, unspecified, uncomplicated: Secondary | ICD-10-CM

## 2020-12-14 MED ORDER — STIOLTO RESPIMAT 2.5-2.5 MCG/ACT IN AERS
2.0000 | INHALATION_SPRAY | Freq: Every day | RESPIRATORY_TRACT | 11 refills | Status: DC
  Filled 2020-12-14: qty 4, fill #0

## 2020-12-14 MED ORDER — STIOLTO RESPIMAT 2.5-2.5 MCG/ACT IN AERS: 2.0000 | INHALATION_SPRAY | Freq: Every day | RESPIRATORY_TRACT | 0 refills | Status: DC

## 2020-12-14 MED ORDER — GUAIFENESIN ER 600 MG PO TB12
600.0000 mg | ORAL_TABLET | Freq: Two times a day (BID) | ORAL | 1 refills | Status: DC | PRN
  Filled 2020-12-14: qty 60, 30d supply, fill #0

## 2020-12-14 MED ORDER — DOXYCYCLINE HYCLATE 100 MG PO TABS
100.0000 mg | ORAL_TABLET | Freq: Two times a day (BID) | ORAL | 0 refills | Status: DC
  Filled 2020-12-14: qty 14, 7d supply, fill #0

## 2020-12-14 NOTE — Assessment & Plan Note (Addendum)
-   Patient reports chronic fatigue symptoms, loud snoring, restless sleep. Epworth 19. High suspicion patient has underlying sleep apnea. Needs in-lab split night sleep study as he is already on oxygen for chronic respiratory failure. FU in 4 weeks to review sleep study results and discuss treatment options further.

## 2020-12-14 NOTE — Progress Notes (Signed)
Agree with the details of the visit as noted by Elizabeth Walsh, NP.  C. Laura Amiel Mccaffrey, MD [facility] 

## 2020-12-14 NOTE — Assessment & Plan Note (Addendum)
-   Patient has had a productive cough x 2 weeks with tan mucus. Lungs are clear on exam, slightly diminished. No overt wheezing. No indication for CXR today or oral prednisone at this time. Starting patient on Doxycycline 100mg  BID x 7 days and prn mucinex for acute bronchitis symptoms. Recommend he resume Stiolto as this works better . Avoiding ICS d.t prior empyema. We have given him 1 month worth of samples and sent in new prescription to medical management. FU in 2-3 months with Dr. Museum/gallery exhibitions officer or sooner if needed.

## 2020-12-14 NOTE — Patient Instructions (Addendum)
Nice meeting you today Timothy [name]  Recommendations: - Treating you for COPD exacerbation today with antibiotic - Resume Stiolto, please notify us if you run out before getting prescription from medication management  - Please take mucinex 600mg  twice a day to loose chest congestion - Start using incentive spirometer 10/hour while awake to encourage deep breathing  - Continue to work on smoking cessation, recommend you pick quit date and try   Rx: - Doxycycline 100mg  twice daily x 7 days   Orders: - Split night sleep study re: hx sleep apnea  Follow-up: - 4 week virtual visit with Timothy [name] to discuss sleep study results  - 3 months with Dr. 

## 2020-12-14 NOTE — Progress Notes (Signed)
'@Patient'  ID: Timothy Main., male    DOB: August 27, 1960, 60 y.o.   MRN: [id number]  No chief complaint on file.   Referring provider: Langston Reusing, NP  HPI: 60 year old male, current every day smoker. PMH significant for COPD, chronic diastolic heart failure, hypertension, empyema lung, respiratory failure with hypoxia, type 2 diabetes.  Patient of Dr. Patsey Berthold, last seen in office on 10/20/2020.  Previous [facility] pulmonary encounter: 10/20/20- Dr. Patsey Berthold  Patient is a 60 year old current smoker (half PPD) who presents for follow-up on COPD and dyspnea.  This is a scheduled appointment.  He was initially evaluated here in December 2021 with a subsequent visit in July 28, 2020.  At that time he had been on Trelegy Ellipta.  On February 23 he noticed that he had been having fevers chills increased shortness of breath and presented to the emergency room.  He was noted to have a large left pleural effusion that was loculated.  Empyema was confirmed and patient was transferred to [facility] for definitive surgical treatment.  He underwent video-assisted thoracoscopy with VATS decortication on 28 February.  He was discharged from [facility] on 8 March.  He did receive IV antibiotics as the organism was Streptococcus intermedius.  He was discharged on 3 L nasal cannula O2.  Since that time he has been decreasing his use of O2.  He actually presented to the clinic without O2 today.  He continues to have issues with shortness of breath.  His most recent chest x-ray shows significant pleural scarring on the left.  This will of course worsen his already compromised lung function.  Additionally he is going to have a stress test on 10 May 2 complete cardiac evaluation.  Some of his dyspnea may be related to cardiac disease.  He unfortunately continues to smoke half pack of cigarettes per day.   12/14/2020 - interim hx  Patient presents today for 6 week follow-up. Hx emphysema lungs, streptococcus  immitis. He had VATS pleurodesis in February 2022 and prolonged IV antibiotics therapy. Residual scarring expected. He was discharged on 3L oxygen and has been decreasing his use. Currently using as needed and at night. During last visit patient was changed from Trelegy to [employer]. He had a normal stress test in May with cardiology.   He is still awaiting prescription from medication management for Stiolto. He feels this worked better but had to resume trelegy in the mean time. He currently has a cough which started two weeks ago, he is getting up white-tan mucus. He is not taking any over the counter medication for cough. Wheezing is not bad during the day but worsens at night. He reports hx sleep apnea and daytime sleepiness. Epworth score 19. He is still smoking, he has cut back from 1-1.5 ppd to 5 cigarettes.   Sleep questionnaire: Previous sleep study- patient reports hx sleep apnea  Symptoms- Snoring, choking, restless sleep, daytimes sleepiness  Bedtime- 10-11pm Nocturnal awakenings- 4-6 times Time our of bed in morning- 7-8am Weight- up 20-30 lbs  Epworth- 19     DATA: 06/16/2020 PFT: FEV1 1.54 L or 42% predicted, FVC 2.54 L or 52% predicted, FEV1/FVC 61%, there is bronchodilator response with net change of 14% on FEV1 postbronchodilator.  There is concomitant restrictive physiology.  There is fluttering of the flow-volume loop consistent with possible OSA.  Diffusion capacity moderately reduced. 06/17/2019 one 2D echo: LVEF 60 to 65%, mildly dilated LA, no diastolic dysfunction. 08/21/2020 chest CT: Residual left empyema/pleural rind  Review of Systems A 10 point review of systems was performed and it is as noted above otherwise negative.  Allergies  Allergen Reactions   Bee Venom Anaphylaxis   Coffea Arabica Shortness Of Breath   Coffee Flavor Shortness Of Breath   Cheese Swelling    Blue Cheese only     There is no immunization history on file for this patient.  Past  Medical History:  Diagnosis Date   Diabetes mellitus without complication ([place])    Hypercholesteremia    Hypertension     Tobacco History: Social History   Tobacco Use  Smoking Status Every Day   Packs/day: 0.50   Years: 41.00   Pack years: 20.50   Types: Cigarettes  Smokeless Tobacco Never  Tobacco Comments   4-5 cigs per day 12/14/2020   Ready to quit: Not Answered Counseling given: Not Answered Tobacco comments: 4-5 cigs per day 12/14/2020   Outpatient Medications Prior to Visit  Medication Sig Dispense Refill   Acetaminophen (ARTHRITIS PAIN PO) Take 1,000 mg by mouth as needed.     albuterol (VENTOLIN HFA) 108 (90 Base) MCG/ACT inhaler INHALE ONE PUFF INTO THE LUNGS EVERY 6 HOURS AS NEEDED FOR WHEEZING OR SHORTNESS OF BREATH. 8.5 g 11   amLODipine (NORVASC) 5 MG tablet TAKE ONE TABLET BY MOUTH EVERY DAY 90 tablet 1   apixaban (ELIQUIS) 5 MG TABS tablet TAKE 2 TABLETS (10MG) TWICE DAILY FOR 7 DAYS, THEN 1 TABLET (5MG) TWICE DAILY 74 tablet 3   blood glucose meter kit and supplies KIT Dispense based on patient and insurance preference. Use up to four times daily as directed. (FOR ICD-9 250.00, 250.01). 1 each 0   citalopram (CELEXA) 20 MG tablet Take 1 tablet (20 mg total) by mouth daily. 30 tablet 0   cloNIDine (CATAPRES) 0.1 MG tablet Take 1 tablet (0.1 mg total) by mouth once daily at bedtime. 56 tablet 0   gabapentin (NEURONTIN) 400 MG capsule Take 1 capsule (400 mg total) by mouth 3 (three) times daily. 90 capsule 2   glipiZIDE (GLUCOTROL) 10 MG tablet Take 1 tablet (10 mg total) by mouth 2 (two) times daily before a meal. 60 tablet 2   Magnesium 400 MG CAPS Take 400 mg by mouth daily.     metFORMIN (GLUCOPHAGE) 1000 MG tablet TAKE ONE TABLET BY MOUTH 2 TIMES A DAY WITH A MEAL 60 tablet 3   pantoprazole (PROTONIX) 40 MG tablet TAKE ONE TABLET BY MOUTH EVERY DAY 30 tablet 3   rosuvastatin (CRESTOR) 5 MG tablet TAKE ONE TABLET BY MOUTH EVERY DAY 30 tablet 3   triamcinolone  (KENALOG) 0.025 % ointment APPLY ONE APPLICATION TOPICALLY 2 TIMES A DAY 30 g 0   Tiotropium Bromide-Olodaterol (STIOLTO RESPIMAT) 2.5-2.5 MCG/ACT AERS Inhale 2 puffs into the lungs daily. 4 g 11   No facility-administered medications prior to visit.    Review of Systems  Review of Systems  Constitutional: Negative.   HENT:  Positive for congestion.   Respiratory:  Positive for cough. Negative for chest tightness and shortness of breath.        Nocturnal wheeze  Cardiovascular: Negative.     Physical Exam  BP 130/82 (BP Location: Left Arm, Patient Position: Sitting, Cuff Size: Normal)   Pulse 82   Temp 98.1 F (36.7 C) (Oral)   Ht '5\' 10"'  (1.778 m)   Wt 198 lb 12.8 oz (90.2 kg)   SpO2 98%   BMI 28.52 kg/m  Physical Exam Constitutional:  Appearance: Normal appearance.  HENT:     Head: Normocephalic and atraumatic.     Mouth/Throat:     Comments: Deferred d/t masking Cardiovascular:     Rate and Rhythm: Normal rate and regular rhythm.  Pulmonary:     Breath sounds: No wheezing, rhonchi or rales.     Comments: CTA, diminished; 3L oxygen  Skin:    General: Skin is warm and dry.  Neurological:     General: No focal deficit present.     Mental Status: He is alert and oriented to person, place, and time. Mental status is at baseline.  Psychiatric:        Mood and Affect: Mood normal.        Behavior: Behavior normal.        Thought Content: Thought content normal.        Judgment: Judgment normal.     Lab Results:  CBC    Component Value Date/Time   WBC 5.8 08/31/2020 1314   RBC 3.45 (L) 08/31/2020 1314   HGB 10.8 (L) 08/31/2020 1314   HGB 14.6 06/22/2020 1058   HCT 31.6 (L) 08/31/2020 1314   HCT 41.8 06/22/2020 1058   PLT 420 (H) 08/31/2020 1314   PLT 301 06/22/2020 1058   MCV 91.6 08/31/2020 1314   MCV 96 06/22/2020 1058   MCH 31.3 08/31/2020 1314   MCHC 34.2 08/31/2020 1314   RDW 14.1 08/31/2020 1314   RDW 13.1 06/22/2020 1058   LYMPHSABS 1.4  08/13/2020 0226   LYMPHSABS 1.2 06/22/2020 1058   MONOABS 1.0 08/13/2020 0226   EOSABS 0.1 08/13/2020 0226   EOSABS 0.1 06/22/2020 1058   BASOSABS 0.0 08/13/2020 0226   BASOSABS 0.0 06/22/2020 1058    BMET    Component Value Date/Time   NA 130 (L) 08/31/2020 1314   NA 134 06/22/2020 1058   K 4.8 08/31/2020 1314   CL 96 (L) 08/31/2020 1314   CO2 24 08/31/2020 1314   GLUCOSE 358 (H) 08/31/2020 1314   BUN 15 08/31/2020 1314   BUN 8 06/22/2020 1058   CREATININE 0.86 08/31/2020 1314   CALCIUM 8.8 (L) 08/31/2020 1314   GFRNONAA >60 08/31/2020 1314   GFRAA 107 06/22/2020 1058    BNP    Component Value Date/Time   BNP 222.7 (H) 08/10/2020 1842    ProBNP No results found for: PROBNP  Imaging: No results found.   Assessment & Plan:   COPD with acute exacerbation ([place]) - Patient has had a productive cough x 2 weeks with tan mucus. Lungs are clear on exam, slightly diminished. No overt wheezing. No indication for CXR today or oral prednisone at this time. Starting patient on Doxycycline 174m BID x 7 days and prn mucinex for acute bronchitis symptoms. Recommend he resume Stiolto as this works better tIT consultant Avoiding ICS d.t prior empyema. We have given him 1 month worth of samples and sent in new prescription to medical management. FU in 2-3 months with Dr. GPatsey Bertholdor sooner if needed.  Respiratory failure with hypoxia ([facility]) - Continue 3L oxygen with exertion and at night - Recommend he get pulse oximeter to monitor oxygen level at home, goal > 88-90%  - Referring to pulmonary rehab   Hx of sleep apnea - Patient reports chronic fatigue symptoms, loud snoring, restless sleep. Epworth 19. High suspicion patient has underlying sleep apnea. Needs in-lab split night sleep study as he is already on oxygen for chronic respiratory failure. FU in 4 weeks to review  sleep study results and discuss treatment options further.   Smoking - He is still smoking, he has cut back from  1-1.5 ppd to 5 cigarettes a day - Encourage patient pick quit date, continue to use NRT and taper patch amount  - Spent 3-5 mins discussing smoking cessation   Shortness of breath - Related COPD GOLD III and prior VATS procedure. He had a normal stress test in May 2022 with cardiology   Martyn Ehrich, NP 12/14/2020

## 2020-12-14 NOTE — Assessment & Plan Note (Signed)
-   He is still smoking, he has cut back from 1-1.5 ppd to 5 cigarettes a day - Encourage patient pick quit date, continue to use NRT and taper patch amount  - Spent 3-5 mins discussing smoking cessation

## 2020-12-14 NOTE — Assessment & Plan Note (Addendum)
-   Continue 3L oxygen with exertion and at night - Recommend he get pulse oximeter to monitor oxygen level at home, goal > 88-90%  - Referring to pulmonary rehab

## 2020-12-14 NOTE — Assessment & Plan Note (Addendum)
-   Related COPD GOLD III and prior VATS procedure. He had a normal stress test in May 2022 with cardiology

## 2020-12-15 ENCOUNTER — Other Ambulatory Visit: Payer: Self-pay

## 2020-12-16 ENCOUNTER — Other Ambulatory Visit: Payer: Self-pay

## 2020-12-18 ENCOUNTER — Other Ambulatory Visit: Payer: Self-pay

## 2020-12-18 ENCOUNTER — Encounter (HOSPITAL_COMMUNITY): Payer: Self-pay

## 2020-12-18 ENCOUNTER — Emergency Department (HOSPITAL_COMMUNITY)
Admission: EM | Admit: 2020-12-18 | Discharge: 2020-12-20 | Disposition: A | Discharge status: Other Acute Inpatient Hospital | Payer: Medicaid Other | Attending: Emergency Medicine | Admitting: Emergency Medicine

## 2020-12-18 DIAGNOSIS — I110 Hypertensive heart disease with heart failure: Secondary | ICD-10-CM | POA: Diagnosis not present

## 2020-12-18 DIAGNOSIS — Z79899 Other long term (current) drug therapy: Secondary | ICD-10-CM | POA: Diagnosis not present

## 2020-12-18 DIAGNOSIS — F332 Major depressive disorder, recurrent severe without psychotic features: Secondary | ICD-10-CM | POA: Diagnosis not present

## 2020-12-18 DIAGNOSIS — F17210 Nicotine dependence, cigarettes, uncomplicated: Secondary | ICD-10-CM | POA: Diagnosis not present

## 2020-12-18 DIAGNOSIS — Z20822 Contact with and (suspected) exposure to covid-19: Secondary | ICD-10-CM | POA: Insufficient documentation

## 2020-12-18 DIAGNOSIS — E1140 Type 2 diabetes mellitus with diabetic neuropathy, unspecified: Secondary | ICD-10-CM | POA: Diagnosis not present

## 2020-12-18 DIAGNOSIS — Z5900 Homelessness unspecified: Secondary | ICD-10-CM

## 2020-12-18 DIAGNOSIS — I5032 Chronic diastolic (congestive) heart failure: Secondary | ICD-10-CM | POA: Diagnosis not present

## 2020-12-18 DIAGNOSIS — F333 Major depressive disorder, recurrent, severe with psychotic symptoms: Secondary | ICD-10-CM

## 2020-12-18 DIAGNOSIS — J441 Chronic obstructive pulmonary disease with (acute) exacerbation: Secondary | ICD-10-CM | POA: Diagnosis not present

## 2020-12-18 DIAGNOSIS — X58XXXA Exposure to other specified factors, initial encounter: Secondary | ICD-10-CM | POA: Diagnosis not present

## 2020-12-18 DIAGNOSIS — Y900 Blood alcohol level of less than 20 mg/100 ml: Secondary | ICD-10-CM | POA: Insufficient documentation

## 2020-12-18 DIAGNOSIS — F4329 Adjustment disorder with other symptoms: Secondary | ICD-10-CM

## 2020-12-18 DIAGNOSIS — Z7984 Long term (current) use of oral hypoglycemic drugs: Secondary | ICD-10-CM | POA: Diagnosis not present

## 2020-12-18 DIAGNOSIS — R45851 Suicidal ideations: Secondary | ICD-10-CM | POA: Diagnosis not present

## 2020-12-18 DIAGNOSIS — Z7951 Long term (current) use of inhaled steroids: Secondary | ICD-10-CM | POA: Insufficient documentation

## 2020-12-18 DIAGNOSIS — S91302A Unspecified open wound, left foot, initial encounter: Secondary | ICD-10-CM | POA: Insufficient documentation

## 2020-12-18 DIAGNOSIS — Z7901 Long term (current) use of anticoagulants: Secondary | ICD-10-CM | POA: Insufficient documentation

## 2020-12-18 DIAGNOSIS — S99922A Unspecified injury of left foot, initial encounter: Secondary | ICD-10-CM | POA: Diagnosis present

## 2020-12-18 LAB — COMPREHENSIVE METABOLIC PANEL
ALT: 25 U/L (ref 0–44)
AST: 24 U/L (ref 15–41)
Albumin: 4.3 g/dL (ref 3.5–5.0)
Alkaline Phosphatase: 82 U/L (ref 38–126)
Anion gap: 11 (ref 5–15)
BUN: 16 mg/dL (ref 6–20)
CO2: 25 mmol/L (ref 22–32)
Calcium: 9.3 mg/dL (ref 8.9–10.3)
Chloride: 101 mmol/L (ref 98–111)
Creatinine, Ser: 0.96 mg/dL (ref 0.61–1.24)
GFR, Estimated: >60 mL/min (ref >60)
Glucose, Bld: 243 mg/dL — ABNORMAL HIGH (ref 70–99)
Potassium: 4.3 mmol/L (ref 3.5–5.1)
Sodium: 137 mmol/L (ref 135–145)
Total Bilirubin: 0.7 mg/dL (ref 0.3–1.2)
Total Protein: 7.5 g/dL (ref 6.5–8.1)

## 2020-12-18 LAB — RAPID URINE DRUG SCREEN, HOSP PERFORMED
Amphetamines: NOT DETECTED
Barbiturates: NOT DETECTED
Benzodiazepines: NOT DETECTED
Cocaine: NOT DETECTED
Opiates: NOT DETECTED
Tetrahydrocannabinol: NOT DETECTED

## 2020-12-18 LAB — CBC
HCT: 42.9 % (ref 39.0–52.0)
Hemoglobin: 15.1 g/dL (ref 13.0–17.0)
MCH: 32.3 pg (ref 26.0–34.0)
MCHC: 35.2 g/dL (ref 30.0–36.0)
MCV: 91.9 fL (ref 80.0–100.0)
Platelets: 234 10*3/uL (ref 150–400)
RBC: 4.67 MIL/uL (ref 4.22–5.81)
RDW: 12.1 % (ref 11.5–15.5)
WBC: 9.1 10*3/uL (ref 4.0–10.5)
nRBC: 0 % (ref 0.0–0.2)

## 2020-12-18 LAB — RESP PANEL BY RT-PCR (FLU A&B, COVID) ARPGX2
Influenza A by PCR: NEGATIVE
Influenza B by PCR: NEGATIVE
SARS Coronavirus 2 by RT PCR: NEGATIVE

## 2020-12-18 LAB — CBG MONITORING, ED: Glucose-Capillary: 194 mg/dL — ABNORMAL HIGH (ref 70–99)

## 2020-12-18 LAB — SALICYLATE LEVEL: Salicylate Lvl: <7 mg/dL — ABNORMAL LOW (ref 7.0–30.0)

## 2020-12-18 LAB — ETHANOL: Alcohol, Ethyl (B): <10 mg/dL (ref <10)

## 2020-12-18 LAB — ACETAMINOPHEN LEVEL: Acetaminophen (Tylenol), Serum: <10 ug/mL — ABNORMAL LOW (ref 10–30)

## 2020-12-18 MED ORDER — AMLODIPINE BESYLATE 5 MG PO TABS: 5.0000 mg | ORAL_TABLET | Freq: Every day | ORAL | Status: DC

## 2020-12-18 MED ORDER — PANTOPRAZOLE SODIUM 40 MG PO TBEC
40.0000 mg | DELAYED_RELEASE_TABLET | Freq: Every day | ORAL | Status: DC
  Administered 2020-12-18 – 2020-12-20 (×3): 40 mg via ORAL
  Filled 2020-12-18 (×3): qty 1

## 2020-12-18 MED ORDER — DOXYCYCLINE HYCLATE 100 MG PO TABS
100.0000 mg | ORAL_TABLET | Freq: Two times a day (BID) | ORAL | Status: DC
  Administered 2020-12-18 – 2020-12-20 (×4): 100 mg via ORAL
  Filled 2020-12-18 (×4): qty 1

## 2020-12-18 MED ORDER — CLONIDINE HCL 0.2 MG PO TABS
0.1000 mg | ORAL_TABLET | Freq: Every day | ORAL | Status: DC
  Administered 2020-12-18 – 2020-12-19 (×2): 0.1 mg via ORAL
  Filled 2020-12-18 (×2): qty 1

## 2020-12-18 MED ORDER — UMECLIDINIUM BROMIDE 62.5 MCG/INH IN AEPB
1.0000 | INHALATION_SPRAY | Freq: Every day | RESPIRATORY_TRACT | Status: DC
  Administered 2020-12-18 – 2020-12-19 (×2): 1 via RESPIRATORY_TRACT
  Filled 2020-12-18 (×2): qty 7

## 2020-12-18 MED ORDER — AMLODIPINE BESYLATE 5 MG PO TABS
5.0000 mg | ORAL_TABLET | Freq: Every day | ORAL | Status: DC
  Administered 2020-12-18 – 2020-12-20 (×3): 5 mg via ORAL
  Filled 2020-12-18 (×3): qty 1

## 2020-12-18 MED ORDER — ROSUVASTATIN CALCIUM 5 MG PO TABS
5.0000 mg | ORAL_TABLET | Freq: Every day | ORAL | Status: DC
  Administered 2020-12-18 – 2020-12-20 (×3): 5 mg via ORAL
  Filled 2020-12-18 (×3): qty 1

## 2020-12-18 MED ORDER — CITALOPRAM HYDROBROMIDE 10 MG PO TABS
20.0000 mg | ORAL_TABLET | Freq: Every day | ORAL | Status: DC
  Administered 2020-12-18 – 2020-12-20 (×3): 20 mg via ORAL
  Filled 2020-12-18 (×3): qty 2

## 2020-12-18 MED ORDER — METFORMIN HCL 500 MG PO TABS
1000.0000 mg | ORAL_TABLET | Freq: Two times a day (BID) | ORAL | Status: DC
  Administered 2020-12-18 – 2020-12-20 (×2): 1000 mg via ORAL
  Filled 2020-12-18 (×3): qty 2

## 2020-12-18 MED ORDER — APIXABAN 5 MG PO TABS
5.0000 mg | ORAL_TABLET | Freq: Two times a day (BID) | ORAL | Status: DC
  Administered 2020-12-18 – 2020-12-20 (×4): 5 mg via ORAL
  Filled 2020-12-18 (×4): qty 1

## 2020-12-18 MED ORDER — NICOTINE 21 MG/24HR TD PT24
21.0000 mg | MEDICATED_PATCH | Freq: Every day | TRANSDERMAL | Status: DC
  Administered 2020-12-18 – 2020-12-20 (×3): 21 mg via TRANSDERMAL
  Filled 2020-12-18 (×3): qty 1

## 2020-12-18 MED ORDER — ARFORMOTEROL TARTRATE 15 MCG/2ML IN NEBU
15.0000 ug | INHALATION_SOLUTION | Freq: Two times a day (BID) | RESPIRATORY_TRACT | Status: DC
  Administered 2020-12-19: 15 ug via RESPIRATORY_TRACT
  Filled 2020-12-18 (×5): qty 2

## 2020-12-18 MED ORDER — GABAPENTIN 400 MG PO CAPS
400.0000 mg | ORAL_CAPSULE | Freq: Three times a day (TID) | ORAL | Status: DC | PRN
  Administered 2020-12-18 – 2020-12-20 (×5): 400 mg via ORAL
  Filled 2020-12-18 (×5): qty 1

## 2020-12-18 NOTE — Progress Notes (Signed)
Per Shnese Mills,NP, patient meets criteria for inpatient treatment. There are no available or appropriate beds at [facility] today. CSW faxed referrals to the following facilities for review:  [facility]  TTS will continue to seek bed placement.  Itzelle Gains, MSW, LCSW-A, LCAS-A Phone: [phone] Disposition/TOC  

## 2020-12-18 NOTE — ED Notes (Addendum)
Patient changed into scrubs. 1 patient belonging bag, and 1 gym bag moved to locker 12. Wanded by security.

## 2020-12-18 NOTE — ED Provider Notes (Signed)
[facility] EMERGENCY DEPARTMENT Provider Note   CSN: [id number] Arrival date & time: 12/18/20  7989     History No chief complaint on file.   Timothy [name]. is a 60 y.o. male with a history of diabetes mellitus, hypertension, COPD, CHF, PTSD.  Patient presents emergency department with a chief complaint of suicidal ideations.  Patient reports that he has had suicidal ideations  As he can remember.  Patient states that his suicidal thoughts have been worse over the last 2 to 3 days.  Patient reports that he has a plan to jump off a bridge or drug currently drained to harm himself.  Patient also expresses the idea of shooting himself with a gun however he states that he does not have access to a gun.  Patient reports that he attempted suicide once before when he was "a child."  Patient denies any attempts to harm himself recently.  Patient denies any homicidal ideations or auditory hallucinations.  Patient does endorse visual hallucinations stating that he is "seeing evil faces when I look up in the clouds."  Patient reports that he takes the medication daily for his depression.  Patient reports compliant with medication.  Patient denies any illicit drug use.  Patient endorses alcohol use however states he has not drink regularly.  Patient states that he last had alcohol yesterday.  Patient endorses tobacco use.    HPI     Past Medical History:  Diagnosis Date   Diabetes mellitus without complication ([place])    Hypercholesteremia    Hypertension     Patient Active Problem List   Diagnosis Date Noted   COPD with acute exacerbation ([place]) 12/14/2020   Hx of sleep apnea 12/14/2020   Empyema lung ([place]) 08/15/2020   Type 2 diabetes mellitus with hyperglycemia ([place]) 08/11/2020   Respiratory failure with hypoxia ([place]) 08/11/2020   Hyponatremia 08/11/2020   Chronic diastolic CHF (congestive heart failure) ([place]) 08/11/2020   Anemia 08/11/2020   Abdominal pain  08/11/2020   Empyema ([place]) 08/10/2020   Osteomyelitis ([place]) 03/15/2020   Open wound of foot 01/20/2020   Elevated lipids 01/20/2020   Personal history of colonic polyps    Shortness of breath 11/04/2019   Encounter to establish care 10/15/2019   Essential hypertension 10/15/2019   Controlled diabetes mellitus type 2 with complications ([place]) 21/19/4174   Smoking 10/15/2019   Peripheral neuropathy 10/15/2019   Cramp, abdominal 10/15/2019   History of hepatitis 10/15/2019   History of gastroesophageal reflux (GERD) 10/15/2019   Type 2 diabetes mellitus with diabetic neuropathy, unspecified ([place]) 10/15/2019   Post-traumatic stress disorder, unspecified 07/14/2019   Antisocial personality disorder ([place]) 07/14/2019    Past Surgical History:  Procedure Laterality Date   AMPUTATION Left 03/22/2020   Procedure: AMPUTATION RAY-1st Ray Left Foot;  Surgeon: [name], [name];  Location: [facility] ORS;  Service: Podiatry;  Laterality: Left;   AMPUTATION TOE Left 03/16/2020   Procedure: AMPUTATION GREAT TOE;  Surgeon: [name], [name];  Location: [facility] ORS;  Service: Podiatry;  Laterality: Left;   COLONOSCOPY WITH PROPOFOL N/A 12/22/2019   Procedure: COLONOSCOPY WITH PROPOFOL;  Surgeon: [name], MD;  Location: [facility] ENDOSCOPY;  Service: Endoscopy;  Laterality: N/A;   LOWER EXTREMITY ANGIOGRAPHY Left 03/18/2020   Procedure: Lower Extremity Angiography;  Surgeon: [name], MD;  Location: [facility] LAB;  Service: Cardiovascular;  Laterality: Left;   TONSILLECTOMY     VIDEO ASSISTED THORACOSCOPY (VATS)/DECORTICATION Left 08/15/2020   Procedure: VIDEO ASSISTED  THORACOSCOPY (VATS)/DECORTICATION;  Surgeon: [name], MD;  Location: [facility] OR;  Service: Thoracic;  Laterality: Left;   WISDOM TOOTH EXTRACTION         Family History  Family history unknown: Yes    Social History   Tobacco Use   Smoking status: Every Day    Packs/day: 0.50    Years: 41.00    Pack years: 20.50     Types: Cigarettes   Smokeless tobacco: Never   Tobacco comments:    4-5 cigs per day 12/14/2020  Vaping Use   Vaping Use: Never used  Substance Use Topics   Alcohol use: Yes    Alcohol/week: 6.0 standard drinks    Types: 6 Cans of beer per week    Comment: occasionally    Drug use: Not Currently    Home Medications Prior to Admission medications   Medication Sig Start Date End Date Taking? Authorizing Provider  Acetaminophen (ARTHRITIS PAIN PO) Take 1,000 mg by mouth as needed.    [provider]  albuterol (VENTOLIN HFA) 108 (90 Base) MCG/ACT inhaler INHALE ONE PUFF INTO THE LUNGS EVERY 6 HOURS AS NEEDED FOR WHEEZING OR SHORTNESS OF BREATH. 05/26/20 05/26/21  Tyler Pita, MD  amLODipine (NORVASC) 5 MG tablet TAKE ONE TABLET BY MOUTH EVERY DAY 08/08/20 08/08/21  Kate Sable, MD  apixaban (ELIQUIS) 5 MG TABS tablet TAKE 2 TABLETS (10MG) TWICE DAILY FOR 7 DAYS, THEN 1 TABLET (5MG) TWICE DAILY 08/23/20 08/23/21  Nita Sells, MD  blood glucose meter kit and supplies KIT Dispense based on patient and insurance preference. Use up to four times daily as directed. (FOR ICD-9 250.00, 250.01). 10/15/19   Iloabachie, Chioma E, NP  citalopram (CELEXA) 20 MG tablet Take 1 tablet (20 mg total) by mouth daily. 11/09/20   Iloabachie, Chioma E, NP  cloNIDine (CATAPRES) 0.1 MG tablet Take 1 tablet (0.1 mg total) by mouth once daily at bedtime. 12/13/20   Iloabachie, Chioma E, NP  doxycycline (VIBRA-TABS) 100 MG tablet Take 1 tablet (100 mg total) by mouth 2 (two) times daily. 12/14/20   Martyn Ehrich, NP  gabapentin (NEURONTIN) 400 MG capsule Take 1 capsule (400 mg total) by mouth 3 (three) times daily. 09/28/20   Iloabachie, Chioma E, NP  glipiZIDE (GLUCOTROL) 10 MG tablet Take 1 tablet (10 mg total) by mouth 2 (two) times daily before a meal. 09/28/20   Iloabachie, Chioma E, NP  guaiFENesin (MUCINEX) 600 MG 12 hr tablet Take 1 tablet (600 mg total) by mouth 2 (two) times daily as  needed for to loosen phlegm or cough. 12/14/20   Martyn Ehrich, NP  Magnesium 400 MG CAPS Take 400 mg by mouth daily.    [provider]  metFORMIN (GLUCOPHAGE) 1000 MG tablet TAKE ONE TABLET BY MOUTH 2 TIMES A DAY WITH A MEAL 09/28/20 09/28/21  Iloabachie, Chioma E, NP  pantoprazole (PROTONIX) 40 MG tablet TAKE ONE TABLET BY MOUTH EVERY DAY 08/23/20 08/23/21  Nita Sells, MD  rosuvastatin (CRESTOR) 5 MG tablet TAKE ONE TABLET BY MOUTH EVERY DAY 12/13/20 12/13/21  Iloabachie, Chioma E, NP  Tiotropium Bromide-Olodaterol (STIOLTO RESPIMAT) 2.5-2.5 MCG/ACT AERS Inhale 2 puffs into the lungs daily. 12/14/20   Martyn Ehrich, NP  Tiotropium Bromide-Olodaterol (STIOLTO RESPIMAT) 2.5-2.5 MCG/ACT AERS Inhale 2 puffs into the lungs once daily. 12/14/20   Martyn Ehrich, NP  Tiotropium Bromide-Olodaterol (STIOLTO RESPIMAT) 2.5-2.5 MCG/ACT AERS Inhale 2 puffs into the lungs daily. 12/14/20   Martyn Ehrich, NP  triamcinolone (KENALOG) 0.025 % ointment APPLY ONE APPLICATION TOPICALLY 2 TIMES A DAY 05/25/20 05/25/21  Tukov-Yual, Arlyss Gandy, NP    Allergies    Bee venom, Coffea arabica, Coffee flavor, and Cheese  Review of Systems   Review of Systems  Constitutional:  Negative for chills and fever.  Eyes:  Negative for visual disturbance.  Respiratory:  Negative for shortness of breath.   Cardiovascular:  Negative for chest pain.  Gastrointestinal:  Negative for abdominal pain, nausea and vomiting.  Genitourinary:  Negative for difficulty urinating.  Musculoskeletal:  Negative for back pain and neck pain.  Skin:  Positive for wound (sole of foot). Negative for color change and rash.  Neurological:  Negative for dizziness, syncope, light-headedness and headaches.  Psychiatric/Behavioral:  Positive for hallucinations and suicidal ideas. Negative for confusion and self-injury.    Physical Exam Updated Vital Signs BP (!) 146/93 (BP Location: Right Arm)   Pulse 91   Temp 98.5 F  (36.9 C) (Oral)   Resp 16   SpO2 97%   Physical Exam Vitals and nursing note reviewed.  Constitutional:      General: He is not in acute distress.    Appearance: He is not ill-appearing, toxic-appearing or diaphoretic.  HENT:     Head: Normocephalic.  Eyes:     General: No scleral icterus.       Right eye: No discharge.        Left eye: No discharge.  Cardiovascular:     Rate and Rhythm: Normal rate.     Pulses:          Dorsalis pedis pulses are 2+ on the left side.  Pulmonary:     Effort: Pulmonary effort is normal. No tachypnea, bradypnea or respiratory distress.     Breath sounds: Normal breath sounds.  Abdominal:     General: There is no distension. There are no signs of injury.     Palpations: Abdomen is soft. There is no mass or pulsatile mass.     Tenderness: There is no abdominal tenderness. There is no guarding or rebound.  Musculoskeletal:     Cervical back: Neck supple.     Left foot: Normal range of motion.     Left Lower Extremity: (Amputation of great toe) Feet:     Left foot:     Skin integrity: Skin breakdown present. No ulcer, blister, erythema, warmth, callus, dry skin or fissure.     Toenail Condition: Left toenails are normal.     Comments: Circular wound noted to sole of left foot just proximal to left great toe amputation, 1 cm in diameter.  No purulent discharge or surrounding erythema/fluctuance/induration.  Refill less than 2 seconds in all digits of left foot.  Sensation intact to all digits of left foot. Skin:    General: Skin is warm and dry.     Comments: No evidence of self injury to patient's chest, abdomen, back, bilateral upper or lower extremities.  Neurological:     General: No focal deficit present.     Mental Status: He is alert.  Psychiatric:        Attention and Perception: He is attentive. He perceives visual hallucinations. He does not perceive auditory hallucinations.        Speech: Speech normal.        Behavior: Behavior is  cooperative.        Thought Content: Thought content is not paranoid or delusional. Thought content includes suicidal ideation. Thought content does not include  homicidal ideation. Thought content includes suicidal plan. Thought content does not include homicidal plan.    ED Results / Procedures / Treatments   Labs (all labs ordered are listed, but only abnormal results are displayed) Labs Reviewed  COMPREHENSIVE METABOLIC PANEL - Abnormal; Notable for the following components:      Result Value   Glucose, Bld 243 (*)    All other components within normal limits  SALICYLATE LEVEL - Abnormal; Notable for the following components:   Salicylate Lvl <1.7 (*)    All other components within normal limits  ACETAMINOPHEN LEVEL - Abnormal; Notable for the following components:   Acetaminophen (Tylenol), Serum <10 (*)    All other components within normal limits  RESP PANEL BY RT-PCR (FLU A&B, COVID) ARPGX2  ETHANOL  CBC  RAPID URINE DRUG SCREEN, HOSP PERFORMED  CBG MONITORING, ED    EKG None  Radiology No results found.  Procedures Procedures   Medications Ordered in ED Medications - No data to display  ED Course  I have reviewed the triage vital signs and the nursing notes.  Pertinent labs & imaging results that were available during my care of the patient were reviewed by me and considered in my medical decision making (see chart for details).    MDM Rules/Calculators/A&P                          Alert 60 year old male no acute distress, nontoxic-appearing.  Patient presents emergency department with a chief complaint of suicidal ideation.  Patient has a plan of suicide and needs to carry out his plan.  Endorses 1 previous suicide attempt when he was "a child."  Denies any illicit drug use.  Endorses occasional alcohol use.  Last EtOH use used 1 day prior.  Patient also endorses visual hallucinations.  Denies any homicidal ideation or auditory hallucinations.  Will order  medical clearance labs.  Patient is medically cleared at this time.  Will place patient in psych hold and consult TTS.  Per Olena Leatherwood, patient meets criteria for inpatient treatment. There are no available or appropriate beds at [facility] today.  Referral sent to local facilities, TTS will continue to seek bed placement.  Home meds ordered.  Final Clinical Impression(s) / ED Diagnoses Final diagnoses:  Suicidal ideation    Rx / DC Orders ED Discharge Orders     None        Loni Beckwith, PA-C 12/18/20 [place], [place], MD 12/20/20 [phone]

## 2020-12-18 NOTE — ED Provider Notes (Signed)
Emergency Medicine Provider Triage Evaluation Note  Timothy [name]. , a 60 y.o. male  was evaluated in triage.  Pt complains of suicidal ideation.  Patient reports that he is always has suicidal ideations as far back as he can remember.  Patient reports that his thoughts have been worse over the last 2 to 3 days.  Patient has a stated plan to jump off of a bridge or in front of a train.  Patient denies any attempts at self-harm.  Patient denies any homicidal ideations or auditory hallucinations.  Patient reports that he has visual hallucinations described as "seeing evil faces when I woke up in the clouds."  Denies any illicit drug use.  No alcohol use.  States last EtOH use was yesterday.  Patient endorses tobacco use.  He reports he has been taking all his medications as prescribed including daily medication for depression.  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  No purulent discharge, surrounding erythema, surrounding fluctuance, surrounding induration.  Medical Decision Making  Medically screening exam initiated at 10:31 AM.  Appropriate orders placed.  Timothy [name]. was informed that the remainder of the evaluation will be completed by another provider, this initial triage assessment does not replace that evaluation, and the importance of remaining in the ED until their evaluation is complete.  The patient appears stable so that the remainder of the work up may be completed by another provider.      Haskel Schroeder, PA-C 12/18/20 1034    Derwood Kaplan,  MD 12/20/20 [id number]

## 2020-12-18 NOTE — ED Triage Notes (Signed)
Patient complains of suicidal thoughts for the past 2-3 days. States that he plans to jump off bridge or sit on train track. Patient takes daily med for depression. Calm with flat affect

## 2020-12-18 NOTE — Progress Notes (Signed)
CSW was contacted by [facility] in reference to this patient for possible placement. It was reported by Jonathan/Phone#: [phone] that the patient is under review, however they would prefer if the patient was IVC'D due to safety concerns if accepted to the facility and the long transport.   Crissie Reese, MSW, LCSW-A, LCAS-A Phone: [phone] Disposition/TOC
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
## 2020-12-18 NOTE — ED Notes (Signed)
Assumed care of pt at this time. Pt lying in bed with eyes closed. Respirations even and unlabored. NAD noted, will continue to monitor. 

## 2020-12-19 ENCOUNTER — Encounter (HOSPITAL_COMMUNITY): Payer: Self-pay | Admitting: Registered Nurse

## 2020-12-19 DIAGNOSIS — F332 Major depressive disorder, recurrent severe without psychotic features: Secondary | ICD-10-CM | POA: Diagnosis present

## 2020-12-19 MED ORDER — ACETAMINOPHEN 325 MG PO TABS
650.0000 mg | ORAL_TABLET | Freq: Once | ORAL | Status: AC
  Administered 2020-12-19: 650 mg via ORAL
  Filled 2020-12-19: qty 2

## 2020-12-19 NOTE — ED Notes (Signed)
tts set up for pt

## 2020-12-19 NOTE — Progress Notes (Signed)
Patient information has been sent to [facility] via secure chat to review for potential admission. Patient meets inpatient criteria per Ophelia Shoulder, NP.   Situation ongoing, CSW will continue to monitor progress.    Signed:  Damita Dunnings, MSW, LCSW-A  12/19/2020 9:31 AM

## 2020-12-19 NOTE — Consult Note (Signed)
Telepsych Consultation   Reason for Consult:  Suicidal ideation Referring Physician:  Dyann Ruddle Location of Patient: [facility] ED Location of Provider: Other: [facility]  Patient Identification: Timothy [name]. MRN:  [id number] Principal Diagnosis: MDD (major depressive disorder), recurrent severe, without psychosis ([place]) Diagnosis:  Principal Problem:   MDD (major depressive disorder), recurrent severe, without psychosis ([place])   Total Time spent with patient: 30 minutes  Subjective:   Timothy [name]. is a 60 y.o. male patient admitted to [facility] ED with complaints of suicidal ideation.  HPI:  Timothy [name]., 60 y.o., male patient seen via tele health by this provider, consulted with Dr. Hampton Abbot; and chart reviewed on 12/19/20.  On evaluation Timothy [name]. Reports he came to the hospital because he was having suicidal thoughts and plan to jump off of an over pass or stand on rail road to get hit by train.  Patient reporting his stressors are "Everything.  I just lost my husband, my place to stay.  Now I have no where to go."  Patient states he has a prior history of suicide attempt "Once as a child and again about 2 yrs ago."  Patient states he had outpatient psychiatric services with [facility] at one time but not now.  Patient states on Celexa in past and it helped some.  Patient unable to contract for safety.     Past Psychiatric History: See above  Risk to Self:  Yes Risk to Others:  No Prior Inpatient Therapy:  Yes Prior Outpatient Therapy:  Yes  Past Medical History:  Past Medical History:  Diagnosis Date   Diabetes mellitus without complication ([place])    Hypercholesteremia    Hypertension     Past Surgical History:  Procedure Laterality Date   AMPUTATION Left 03/22/2020   Procedure: AMPUTATION RAY-1st Ray Left Foot;  Surgeon: [name], [name];  Location: [facility] ORS;  Service: Podiatry;  Laterality: Left;   AMPUTATION TOE Left 03/16/2020    Procedure: AMPUTATION GREAT TOE;  Surgeon: [name], [name];  Location: [facility] ORS;  Service: Podiatry;  Laterality: Left;   COLONOSCOPY WITH PROPOFOL N/A 12/22/2019   Procedure: COLONOSCOPY WITH PROPOFOL;  Surgeon: [name], MD;  Location: [facility] ENDOSCOPY;  Service: Endoscopy;  Laterality: N/A;   LOWER EXTREMITY ANGIOGRAPHY Left 03/18/2020   Procedure: Lower Extremity Angiography;  Surgeon: [name], MD;  Location: [facility] CV LAB;  Service: Cardiovascular;  Laterality: Left;   TONSILLECTOMY     VIDEO ASSISTED THORACOSCOPY (VATS)/DECORTICATION Left 08/15/2020   Procedure: VIDEO ASSISTED THORACOSCOPY (VATS)/DECORTICATION;  Surgeon: [name], MD;  Location: [facility] OR;  Service: Thoracic;  Laterality: Left;   WISDOM TOOTH EXTRACTION     Family History:  Family History  Family history unknown: Yes   Family Psychiatric  History: None noted Social History:  Social History   Substance and Sexual Activity  Alcohol Use Yes   Alcohol/week: 6.0 standard drinks   Types: 6 Cans of beer per week   Comment: occasionally      Social History   Substance and Sexual Activity  Drug Use Not Currently    Social History   Socioeconomic History   Marital status: Single    Spouse name: Not on file   Number of children: 0   Years of education: Not on file   Highest education level: Not on file  Occupational History   Occupation: Unemployed  Tobacco Use   Smoking status: Every Day  Packs/day: 0.50    Years: 41.00    Pack years: 20.50    Types: Cigarettes   Smokeless tobacco: Never   Tobacco comments:    4-5 cigs per day 12/14/2020  Vaping Use   Vaping Use: Never used  Substance and Sexual Activity   Alcohol use: Yes    Alcohol/week: 6.0 standard drinks    Types: 6 Cans of beer per week    Comment: occasionally    Drug use: Not Currently   Sexual activity: Not Currently  Other Topics Concern   Not on file  Social History Narrative   Not on file   Social  Determinants of Health   Financial Resource Strain: Medium Risk   Difficulty of Paying Living Expenses: Somewhat hard  Food Insecurity: No Food Insecurity   Worried About Running Out of Food in the Last Year: Never true   Ran Out of Food in the Last Year: Never true  Transportation Needs: No Transportation Needs   Lack of Transportation (Medical): No   Lack of Transportation (Non-Medical): No  Physical Activity: Inactive   Days of Exercise per Week: 0 days   Minutes of Exercise per Session: 0 min  Stress: Stress Concern Present   Feeling of Stress : Rather much  Social Connections: Unknown   Frequency of Communication with Friends and Family: Three times a week   Frequency of Social Gatherings with Friends and Family: Never   Attends Religious Services: Never   Marine scientist or Organizations: No   Attends Archivist Meetings: Never   Marital Status: Not on file   Additional Social History:    Allergies:   Allergies  Allergen Reactions   Bee Venom Anaphylaxis   Cheese Anaphylaxis, Swelling and Other (See Comments)    Blue Cheese only   Coffea Arabica Shortness Of Breath   Coffee Flavor Shortness Of Breath    Labs:  Results for orders placed or performed during the hospital encounter of 12/18/20 (from the past 48 hour(s))  Comprehensive metabolic panel     Status: Abnormal   Collection Time: 12/18/20 10:23 AM  Result Value Ref Range   Sodium 137 135 - 145 mmol/L   Potassium 4.3 3.5 - 5.1 mmol/L   Chloride 101 98 - 111 mmol/L   CO2 25 22 - 32 mmol/L   Glucose, Bld 243 (H) 70 - 99 mg/dL    Comment: Glucose reference range applies only to samples taken after fasting for at least 8 hours.   BUN 16 6 - 20 mg/dL   Creatinine, Ser 0.96 0.61 - 1.24 mg/dL   Calcium 9.3 8.9 - 10.3 mg/dL   Total Protein 7.5 6.5 - 8.1 g/dL   Albumin 4.3 3.5 - 5.0 g/dL   AST 24 15 - 41 U/L   ALT 25 0 - 44 U/L   Alkaline Phosphatase 82 38 - 126 U/L   Total Bilirubin 0.7 0.3 -  1.2 mg/dL   GFR, Estimated >60 >60 mL/min    Comment: (NOTE) Calculated using the CKD-EPI Creatinine Equation (2021)    Anion gap 11 5 - 15    Comment: Performed at [facility] [address], [place], [place] [address]  Ethanol     Status: None   Collection Time: 12/18/20 10:23 AM  Result Value Ref Range   Alcohol, Ethyl (B) <10 <10 mg/dL    Comment: (NOTE) Lowest detectable limit for serum alcohol is 10 mg/dL.  For medical purposes only. Performed at  [facility], [place] [address] [place], [place] [address]   Salicylate level     Status: Abnormal   Collection Time: 12/18/20 10:23 AM  Result Value Ref Range   Salicylate Lvl <2.9 (L) 7.0 - 30.0 mg/dL    Comment: Performed at [facility] [address], [place], [place] [address]  Acetaminophen level     Status: Abnormal   Collection Time: 12/18/20 10:23 AM  Result Value Ref Range   Acetaminophen (Tylenol), Serum <10 (L) 10 - 30 ug/mL    Comment: (NOTE) Therapeutic concentrations vary significantly. A range of 10-30 ug/mL  may be an effective concentration for many patients. However, some  are best treated at concentrations outside of this range. Acetaminophen concentrations >150 ug/mL at 4 hours after ingestion  and >50 ug/mL at 12 hours after ingestion are often associated with  toxic reactions.  Performed at [facility], [place] [address] [place], [place] [address]   cbc     Status: None   Collection Time: 12/18/20 10:23 AM  Result Value Ref Range   WBC 9.1 4.0 - 10.5 K/uL   RBC 4.67 4.22 - 5.81 MIL/uL   Hemoglobin 15.1 13.0 - 17.0 g/dL   HCT 42.9 39.0 - 52.0 %   MCV 91.9 80.0 - 100.0 fL   MCH 32.3 26.0 - 34.0 pg   MCHC 35.2 30.0 - 36.0 g/dL   RDW 12.1 11.5 - 15.5 %   Platelets 234 150 - 400 K/uL   nRBC 0.0 0.0 - 0.2 %    Comment: Performed at [facility], [place] [address] [place], [place] [address]  Rapid urine drug screen (hospital performed)     Status: None   Collection Time:  12/18/20 10:23 AM  Result Value Ref Range   Opiates NONE DETECTED NONE DETECTED   Cocaine NONE DETECTED NONE DETECTED   Benzodiazepines NONE DETECTED NONE DETECTED   Amphetamines NONE DETECTED NONE DETECTED   Tetrahydrocannabinol NONE DETECTED NONE DETECTED   Barbiturates NONE DETECTED NONE DETECTED    Comment: (NOTE) DRUG SCREEN FOR MEDICAL PURPOSES ONLY.  IF CONFIRMATION IS NEEDED FOR ANY PURPOSE, NOTIFY LAB WITHIN 5 DAYS.  LOWEST DETECTABLE LIMITS FOR URINE DRUG SCREEN Drug Class                     Cutoff (ng/mL) Amphetamine and metabolites    1000 Barbiturate and metabolites    200 Benzodiazepine                 109 Tricyclics and metabolites     300 Opiates and metabolites        300 Cocaine and metabolites        300 THC                            50 Performed at [facility], [place] [address], [place], [place] [address]   Resp Panel by RT-PCR (Flu A&B, Covid) Nasopharyngeal Swab     Status: None   Collection Time: 12/18/20  3:53 PM   Specimen: Nasopharyngeal Swab; Nasopharyngeal(NP) swabs in vial transport medium  Result Value Ref Range   SARS Coronavirus 2 by RT PCR NEGATIVE NEGATIVE    Comment: (NOTE) SARS-CoV-2 target nucleic acids are NOT DETECTED.  The SARS-CoV-2 RNA is generally detectable in upper respiratory specimens during the acute phase of infection. The lowest concentration of SARS-CoV-2 viral copies this  assay can detect is 138 copies/mL. A negative result does not preclude SARS-Cov-2 infection and should not be used as the sole basis for treatment or other patient management decisions. A negative result may occur with  improper specimen collection/handling, submission of specimen other than nasopharyngeal swab, presence of viral mutation(s) within the areas targeted by this assay, and inadequate number of viral copies(<138 copies/mL). A negative result must be combined with clinical observations, patient history, and  epidemiological information. The expected result is Negative.  Fact Sheet for Patients:  EntrepreneurPulse.com.au  Fact Sheet for Healthcare Providers:  IncredibleEmployment.be  This test is no t yet approved or cleared by the [place] FDA and  has been authorized for detection and/or diagnosis of SARS-CoV-2 by FDA under an Emergency Use Authorization (EUA). This EUA will remain  in effect (meaning this test can be used) for the duration of the COVID-19 declaration under Section 564(b)(1) of the Act, 21 U.S.C.section 360bbb-3(b)(1), unless the authorization is terminated  or revoked sooner.       Influenza A by PCR NEGATIVE NEGATIVE   Influenza B by PCR NEGATIVE NEGATIVE    Comment: (NOTE) The Xpert Xpress SARS-CoV-2/FLU/RSV plus assay is intended as an aid in the diagnosis of influenza from Nasopharyngeal swab specimens and should not be used as a sole basis for treatment. Nasal washings and aspirates are unacceptable for Xpert Xpress SARS-CoV-2/FLU/RSV testing.  Fact Sheet for Patients: EntrepreneurPulse.com.au  Fact Sheet for Healthcare Providers: IncredibleEmployment.be  This test is not yet approved or cleared by the [place] FDA and has been authorized for detection and/or diagnosis of SARS-CoV-2 by FDA under an Emergency Use Authorization (EUA). This EUA will remain in effect (meaning this test can be used) for the duration of the COVID-19 declaration under Section 564(b)(1) of the Act, 21 U.S.C. section 360bbb-3(b)(1), unless the authorization is terminated or revoked.  Performed at [facility], [place] [address] [place], [place] [address]   CBG monitoring, ED     Status: Abnormal   Collection Time: 12/18/20  5:04 PM  Result Value Ref Range   Glucose-Capillary 194 (H) 70 - 99 mg/dL    Comment: Glucose reference range applies only to samples taken after fasting for at least 8  hours.    Medications:  Current Facility-Administered Medications  Medication Dose Route Frequency Provider Last Rate Last Admin   amLODipine (NORVASC) tablet 5 mg  5 mg Oral Daily Heloise Purpura, RPH   5 mg at 12/19/20 0910   apixaban (ELIQUIS) tablet 5 mg  5 mg Oral BID Loni Beckwith, PA-C   5 mg at 12/19/20 0910   arformoterol (BROVANA) nebulizer solution 15 mcg  15 mcg Nebulization BID Loni Beckwith, PA-C       And   umeclidinium bromide (INCRUSE ELLIPTA) 62.5 MCG/INH 1 puff  1 puff Inhalation Daily Loni Beckwith, PA-C   1 puff at 12/19/20 7782   citalopram (CELEXA) tablet 20 mg  20 mg Oral Daily Merlyn Lot E, NP   20 mg at 12/19/20 0910   cloNIDine (CATAPRES) tablet 0.1 mg  0.1 mg Oral QHS Loni Beckwith, PA-C   0.1 mg at 12/18/20 2126   doxycycline (VIBRA-TABS) tablet 100 mg  100 mg Oral BID Loni Beckwith, PA-C   100 mg at 12/19/20 4235   gabapentin (NEURONTIN) capsule 400 mg  400 mg Oral TID PRN Loni Beckwith, PA-C   400 mg at 12/18/20 1712   metFORMIN (GLUCOPHAGE) tablet 1,000  mg  1,000 mg Oral BID WC Loni Beckwith, PA-C   1,000 mg at 12/18/20 1711   nicotine (NICODERM CQ - dosed in mg/24 hours) patch 21 mg  21 mg Transdermal Daily Loni Beckwith, PA-C   21 mg at 12/19/20 0910   pantoprazole (PROTONIX) EC tablet 40 mg  40 mg Oral Daily Loni Beckwith, PA-C   40 mg at 12/19/20 0910   rosuvastatin (CRESTOR) tablet 5 mg  5 mg Oral Daily Loni Beckwith, PA-C   5 mg at 12/19/20 9242   Current Outpatient Medications  Medication Sig Dispense Refill   acetaminophen (TYLENOL) 650 MG CR tablet Take 1,300 mg by mouth every 8 (eight) hours as needed for pain.     albuterol (VENTOLIN HFA) 108 (90 Base) MCG/ACT inhaler INHALE ONE PUFF INTO THE LUNGS EVERY 6 HOURS AS NEEDED FOR WHEEZING OR SHORTNESS OF BREATH. (Patient taking differently: Inhale 1-2 puffs into the lungs See admin instructions. Inhale 1-2 puffs into the lungs  every four to six hours as needed for shortness of breath or wheezing) 8.5 g 11   amLODipine (NORVASC) 5 MG tablet TAKE ONE TABLET BY MOUTH EVERY DAY (Patient taking differently: Take 5 mg by mouth daily.) 90 tablet 1   apixaban (ELIQUIS) 5 MG TABS tablet TAKE 2 TABLETS (10MG) TWICE DAILY FOR 7 DAYS, THEN 1 TABLET (5MG) TWICE DAILY (Patient taking differently: Take 5 mg by mouth 2 (two) times daily.) 74 tablet 3   citalopram (CELEXA) 20 MG tablet Take 1 tablet (20 mg total) by mouth daily. (Patient taking differently: Take 20 mg by mouth at bedtime.) 30 tablet 0   cloNIDine (CATAPRES) 0.1 MG tablet Take 1 tablet (0.1 mg total) by mouth once daily at bedtime. 56 tablet 0   doxycycline (VIBRA-TABS) 100 MG tablet Take 1 tablet (100 mg total) by mouth 2 (two) times daily. 14 tablet 0   gabapentin (NEURONTIN) 400 MG capsule Take 1 capsule (400 mg total) by mouth 3 (three) times daily. (Patient taking differently: Take 400 mg by mouth 3 (three) times daily as needed (for neuropathy).) 90 capsule 2   glipiZIDE (GLUCOTROL) 10 MG tablet Take 1 tablet (10 mg total) by mouth 2 (two) times daily before a meal. 60 tablet 2   magnesium oxide (MAG-OX) 400 MG tablet Take 800 mg by mouth 2 (two) times daily.     metFORMIN (GLUCOPHAGE) 1000 MG tablet TAKE ONE TABLET BY MOUTH 2 TIMES A DAY WITH A MEAL (Patient taking differently: Take 1,000 mg by mouth 2 (two) times daily with a meal.) 60 tablet 3   nicotine (NICODERM CQ - DOSED IN MG/24 HOURS) 21 mg/24hr patch Place 21 mg onto the skin daily.     pantoprazole (PROTONIX) 40 MG tablet TAKE ONE TABLET BY MOUTH EVERY DAY (Patient taking differently: Take 40 mg by mouth daily.) 30 tablet 3   rosuvastatin (CRESTOR) 5 MG tablet TAKE ONE TABLET BY MOUTH EVERY DAY (Patient taking differently: Take 5 mg by mouth daily.) 30 tablet 3   Tiotropium Bromide-Olodaterol (STIOLTO RESPIMAT) 2.5-2.5 MCG/ACT AERS Inhale 2 puffs into the lungs once daily. 4 g 11   blood glucose meter kit and  supplies KIT Dispense based on patient and insurance preference. Use up to four times daily as directed. (FOR ICD-9 250.00, 250.01). 1 each 0   guaiFENesin (MUCINEX) 600 MG 12 hr tablet Take 1 tablet (600 mg total) by mouth 2 (two) times daily as needed for to loosen phlegm or cough. (  Patient not taking: Reported on 12/18/2020) 60 tablet 1    Musculoskeletal: Strength & Muscle Tone: within normal limits Gait & Station: normal Patient leans: N/A  Psychiatric Specialty Exam:  Presentation  General Appearance: Appropriate for Environment  Eye Contact:Good  Speech:Clear and Coherent; Normal Rate  Speech Volume:Normal  Handedness:Right   Mood and Affect  Mood:Depressed  Affect:Congruent; Depressed   Thought Process  Thought Processes:Coherent; Goal Directed  Descriptions of Associations:No data recorded Orientation:Full (Time, Place and Person)  Thought Content:WDL  History of Schizophrenia/Schizoaffective disorder:No  Duration of Psychotic Symptoms:Greater than six months  Hallucinations:Hallucinations: None  Ideas of Reference:None  Suicidal Thoughts:Suicidal Thoughts: Yes, Active SI Active Intent and/or Plan: With Intent; With Plan; With Means to Carry Out (Patient states he has plan to stand on rail road or jump off of an over pass.  "I ha a gun in the home I was staying in but since I'm not there anymore I don't have access to it.")  Homicidal Thoughts:Homicidal Thoughts: No   Sensorium  Memory:Immediate Good; Recent Good  Judgment:Fair  Insight:Fair; Present   Executive Functions  Concentration:Good  Attention Span:Good  [place] of Knowledge:Good  Language:Good   Psychomotor Activity  Psychomotor Activity:Psychomotor Activity: Normal   Assets  Assets:Communication Skills; Desire for Improvement; Resilience   Sleep  Sleep:Sleep: Good    Physical Exam: Physical Exam Vitals and nursing note reviewed. Exam conducted with a  chaperone present.  Constitutional:      General: He is not in acute distress.    Appearance: Normal appearance. He is not ill-appearing.  Cardiovascular:     Rate and Rhythm: Normal rate.  Pulmonary:     Effort: Pulmonary effort is normal.  Neurological:     Mental Status: He is alert and oriented to person, place, and time.  Psychiatric:        Attention and Perception: Attention and perception normal. He does not perceive auditory or visual hallucinations.        Mood and Affect: Mood is depressed. Affect is flat.        Speech: Speech normal.        Behavior: Behavior normal. Behavior is cooperative.        Thought Content: Thought content is not paranoid or delusional. Thought content includes suicidal ideation. Thought content does not include homicidal ideation. Thought content includes suicidal plan.        Cognition and Memory: Cognition and memory normal.   Review of Systems  Constitutional: Negative.   HENT: Negative.    Eyes: Negative.   Respiratory: Negative.    Cardiovascular: Negative.   Gastrointestinal: Negative.   Genitourinary: Negative.   Musculoskeletal: Negative.   Skin: Negative.   Neurological: Negative.   Endo/Heme/Allergies: Negative.   Psychiatric/Behavioral:  Positive for depression and suicidal ideas. Negative for hallucinations. The patient is nervous/anxious. The patient does not have insomnia.   Blood pressure (!) 142/86, pulse 77, temperature 98.9 F (37.2 C), temperature source Oral, resp. rate 16, SpO2 98 %. There is no height or weight on file to calculate BMI.  Treatment Plan Summary: Daily contact with patient to assess and evaluate symptoms and progress in treatment, Medication management, and Plan Inpatient psychiatric treatment  Disposition: Recommend psychiatric Inpatient admission when medically cleared.  This service was provided via telemedicine using a 2-way, interactive audio and video technology.  Names of all persons  participating in this telemedicine service and their role in this encounter. Name: Earleen [place], NP Role: PMHNP  Name: Dr.  Hampton Abbot Role: Psychiatrist  Name: Timothy [name] Role: Patient  Name: Ralph Leyden, RN Role: Patient's nurse sent a secure message informing: Psychiatric consult completed.  Continue to recommend inpatient psychiatric treatment.  Please inform MD only default listed.     Salimata Christenson, NP 12/19/2020 1:24 PM

## 2020-12-19 NOTE — ED Notes (Signed)
Patient refused a Snack. 

## 2020-12-19 NOTE — BH Assessment (Signed)
Record Reviewed for Inpatient [facility] placement. This patient with hx of COPD, reported sleep apnea with chronic 02 use per patient report, uses 3l [place] at night. Unable to accommodate this need at [facility]. Will have SW fax to med-psych facility.

## 2020-12-19 NOTE — Progress Notes (Signed)
Patient has been faxed out due to medical complications. Patient has been recommended for medical psych facilities. Patient meets inpatient criteria per [facility]. Patient referred to the following facilities:  [facility]  [address] [place] [place] [address] [phone]  [facility]  [address] [place] [address] [phone]  [facility]  [address] [place] [address] [phone]  [facility]  [address] [place], [place] [address] [phone]  [facility]  [address] [place] [address] [phone]  [facility]  [address] [place], [place] [address] [phone]  [facility]  [address] [place] [address] [phone]  [facility]  [address] [place] [address] [phone]  [facility]  [address] [place] [address] [phone]  [facility]  [address] [place] [address] [phone]  [facility]  [address] [place] [address] [phone]  [facility]  [address] [place] [address] [phone]  [facility]  [address], [place] [address] [phone]  [facility]  [address], [place] [address] [phone]  [facility]  [address], [place] [address] [phone]  [facility]  [address] [place] [address] [phone]  [facility]  [address] [place],  [place] [address] [phone]  [facility]  [address] [place] [address] [phone]  [facility]  [address] [place] [address] [phone]    CSW will continue to monitor disposition.    Damita Dunnings, MSW, LCSW-A  10:25 AM 12/19/2020

## 2020-12-19 NOTE — BH Assessment (Signed)
Disposition:   Per Assunta Found, NP, patient meets criteria for inpatient psychiatric treatment. Patient is not not appropriate for admission to [facility] and has been faxed out due to medical complications. Patient referred out to multiple faculties.  @1421 , Morning CSW noted that pt is currently under review at [facility]. @1553 , Clinician Followed up with [facility]. Spoke to [place] who stated that patient was denied due to chronicity.   Disposition staff will continue seeking appropriate placement.      , MS, LCAS Disposition Clinician  [phone]

## 2020-12-20 MED ORDER — ARFORMOTEROL TARTRATE 15 MCG/2ML IN NEBU
15.0000 ug | INHALATION_SOLUTION | Freq: Two times a day (BID) | RESPIRATORY_TRACT | Status: DC
  Filled 2020-12-20 (×2): qty 2

## 2020-12-20 MED ORDER — UMECLIDINIUM BROMIDE 62.5 MCG/INH IN AEPB
1.0000 | INHALATION_SPRAY | Freq: Every day | RESPIRATORY_TRACT | Status: DC
  Administered 2020-12-20: 1 via RESPIRATORY_TRACT

## 2020-12-20 NOTE — ED Notes (Signed)
[facility] notified regarding pt being ready for transport they have all the information and will notify us when they are in route

## 2020-12-20 NOTE — ED Notes (Addendum)
Attempted to call report on pt.  Spoke with the director of facility Joni Reining she refuses to take report unless the transportation team is here to get pt.

## 2020-12-20 NOTE — Progress Notes (Signed)
Patient has been faxed out due to no beds available at [facility]. Patient meets inpatient criteria per [facility],NP. Patient referred to the following facilities:  [facility]  [address] [place]., [place] [address] [phone]  [facility]  [address] [place] [address] [phone]  [facility]  [address] [place] [address] [phone]  [facility]  [address] [place], [place] [address] [phone]  [facility]  [address] [place] [address] [phone]  [facility]  [address] [place], [place] [address] [phone]  [facility]  [address] [place] [address] [phone]  [facility]  [address] [place] [address] [phone]  [facility]  [address], [place] [address] [phone]  [facility]  [address] [place] [address] [phone]  [facility]  [address], [place] [address] [phone]  [facility]  [address] [place] [address] [phone]  [facility]  [address] [place] [address] [phone]  [facility]  [address], [place] [address] [phone]  [facility]  [address] [place] [address] [phone]  [facility]  [address] [place] [address] [phone]  [facility]  [address] [place], [place] [address] [phone]  [facility]  [address], [place] [address] [phone]  [facility]  [address], [place] [address] [phone]    CSW will continue to monitor disposition.    Damita Dunnings, MSW, LCSW-A  9:58 AM 12/20/2020

## 2020-12-20 NOTE — Progress Notes (Signed)
Pt accepted to [facility]   Patient meets inpatient criteria per Assunta Found, NP   Dr. Margarita Rana is the attending provider.    Call report to [phone]   Karsten Ro, MD @ [facility] ED notified.     Pt scheduled  to arrive at [facility] TODAY by 1800   Damita Dunnings, MSW, LCSW-A  1:08 PM 12/20/2020

## 2020-12-20 NOTE — ED Notes (Signed)
Placed Breakfast Order 

## 2020-12-20 NOTE — ED Provider Notes (Signed)
Emergency Medicine Observation Re-evaluation Note  Timothy [name]. is a 60 y.o. male, seen on rounds today.  Pt initially presented to the ED for complaints of Suicidal (Pt reported that he has been depressed for eight months, has felt suicidal since death of husband.  Endorsed plan.) Currently, the patient is resting.  Physical Exam  BP 122/81   Pulse 76   Temp 98 F (36.7 C)   Resp 20   SpO2 99%  Physical Exam General: resting, nad Cardiac: warm and well perfused Lungs: even and unlabored Psych: calm  ED Course / MDM  EKG:   I have reviewed the labs performed to date as well as medications administered while in observation.  Recent changes in the last 24 hours include psych working on placement, found bed at [facility].  Plan  Current plan is for placement at [facility]. Patient is not under full IVC at this time.   Milagros Loll, MD 12/21/20 [phone]

## 2020-12-21 ENCOUNTER — Telehealth: Payer: Self-pay

## 2020-12-21 ENCOUNTER — Other Ambulatory Visit: Payer: Self-pay

## 2020-12-21 NOTE — Telephone Encounter (Signed)
Received record request from disability determination services.  Request has been faxed to medical records.

## 2020-12-26 ENCOUNTER — Other Ambulatory Visit: Payer: Self-pay

## 2020-12-29 ENCOUNTER — Ambulatory Visit: Payer: Self-pay | Admitting: Gerontology

## 2021-01-09 DIAGNOSIS — Z736 Limitation of activities due to disability: Secondary | ICD-10-CM

## 2021-01-12 ENCOUNTER — Other Ambulatory Visit: Payer: Self-pay

## 2021-01-12 ENCOUNTER — Encounter: Payer: Medicaid Other | Admitting: Primary Care

## 2021-01-12 ENCOUNTER — Telehealth: Payer: Self-pay | Admitting: *Deleted

## 2021-01-12 NOTE — Progress Notes (Deleted)
Virtual Visit via Telephone Note  I connected with Timothy [name]. on 01/12/21 at 10:00 AM EDT by telephone and verified that I am speaking with the correct person using two identifiers.  Location: Patient: Home Provider: Office   I discussed the limitations, risks, security and privacy concerns of performing an evaluation and management service by telephone and the availability of in person appointments. I also discussed with the patient that there may be a patient responsible charge related to this service. The patient expressed understanding and agreed to proceed.   History of Present Illness:  60 year old male, current every day smoker. PMH significant for COPD, chronic diastolic heart failure, hypertension, empyema lung, respiratory failure with hypoxia, type 2 diabetes.  Patient of Dr. Jayme Cloud, last seen in office on 10/20/2020.  Previous [facility] pulmonary encounter: 10/20/20- Dr. Jayme Cloud  Patient is a 60 year old current smoker (half PPD) who presents for follow-up on COPD and dyspnea.  This is a scheduled appointment.  He was initially evaluated here in December 2021 with a subsequent visit in July 28, 2020.  At that time he had been on Trelegy Ellipta.  On February 23 he noticed that he had been having fevers chills increased shortness of breath and presented to the emergency room.  He was noted to have a large left pleural effusion that was loculated.  Empyema was confirmed and patient was transferred to [facility] for definitive surgical treatment.  He underwent video-assisted thoracoscopy with VATS decortication on 28 February.  He was discharged from [facility] on 8 March.  He did receive IV antibiotics as the organism was Streptococcus intermedius.  He was discharged on 3 L nasal cannula O2.  Since that time he has been decreasing his use of O2.  He actually presented to the clinic without O2 today.  He continues to have issues with shortness of breath.  His most recent chest x-ray  shows significant pleural scarring on the left.  This will of course worsen his already compromised lung function.  Additionally he is going to have a stress test on 10 May 2 complete cardiac evaluation.  Some of his dyspnea may be related to cardiac disease.  He unfortunately continues to smoke half pack of cigarettes per day.   12/14/2020 - interim hx  Patient presents today for 6 week follow-up. Hx emphysema lungs, streptococcus immitis. He had VATS pleurodesis in February 2022 and prolonged IV antibiotics therapy. Residual scarring expected. He was discharged on 3L oxygen and has been decreasing his use. Currently using as needed and at night. During last visit patient was changed from Trelegy to [employer]. He had a normal stress test in May with cardiology.   He is still awaiting prescription from medication management for Stiolto. He feels this worked better but had to resume trelegy in the mean time. He currently has a cough which started two weeks ago, he is getting up white-tan mucus. He is not taking any over the counter medication for cough. Wheezing is not bad during the day but worsens at night. He reports hx sleep apnea and daytime sleepiness. Epworth score 19. He is still smoking, he has cut back from 1-1.5 ppd to 5 cigarettes.   Sleep questionnaire: Previous sleep study- patient reports hx sleep apnea  Symptoms- Snoring, choking, restless sleep, daytimes sleepiness  Bedtime- 10-11pm Nocturnal awakenings- 4-6 times Time our of bed in morning- 7-8am Weight- up 20-30 lbs  Epworth- 19    01/12/2021 Patient contacted today to review sleep study results,  split night sleep study has not been completed   Current smoker. During last visit we resumed him Stiolto and treated him for acute exacerbation of bronchtis symptoms with Doxycycline and mucinex. He is on 3L oxygen with exertion and at night. Referred to pulmonary rehab      DATA: 06/16/2020 PFT: FEV1 1.54 L or 42% predicted, FVC  2.54 L or 52% predicted, FEV1/FVC 61%, there is bronchodilator response with net change of 14% on FEV1 postbronchodilator.  There is concomitant restrictive physiology.  There is fluttering of the flow-volume loop consistent with possible OSA.  Diffusion capacity moderately reduced. 06/17/2019 one 2D echo: LVEF 60 to 65%, mildly dilated LA, no diastolic dysfunction. 08/21/2020 chest CT: Residual left empyema/pleural rind  Review of Systems A 10 point review of systems was performed and it is as noted above otherwise negative.      Observations/Objective:   Assessment and Plan:   Follow Up Instructions:    I discussed the assessment and treatment plan with the patient. The patient was provided an opportunity to ask questions and all were answered. The patient agreed with the plan and demonstrated an understanding of the instructions.   The patient was advised to call back or seek an in-person evaluation if the symptoms worsen or if the condition fails to improve as anticipated.  I provided *** minutes of non-face-to-face time during this encounter.   Glenford Bayley, NP

## 2021-01-16 ENCOUNTER — Other Ambulatory Visit: Payer: Self-pay

## 2021-01-16 MED ORDER — GLIPIZIDE 10 MG PO TABS
ORAL_TABLET | ORAL | 1 refills | Status: DC
  Filled 2021-01-16: qty 60, 30d supply, fill #0

## 2021-01-16 MED ORDER — CLONIDINE HCL 0.1 MG PO TABS: ORAL_TABLET | ORAL | 1 refills | Status: DC

## 2021-01-16 MED ORDER — ELIQUIS 5 MG PO TABS
ORAL_TABLET | ORAL | 1 refills | Status: DC
  Filled 2021-01-16: qty 60, 30d supply, fill #0
  Filled 2021-02-16: qty 60, 30d supply, fill #1

## 2021-01-16 MED ORDER — GABAPENTIN 400 MG PO CAPS
ORAL_CAPSULE | ORAL | 1 refills | Status: DC
  Filled 2021-01-16: qty 90, 30d supply, fill #0
  Filled 2021-02-16: qty 90, 30d supply, fill #1

## 2021-01-16 MED ORDER — PANTOPRAZOLE SODIUM 40 MG PO TBEC
DELAYED_RELEASE_TABLET | ORAL | 1 refills | Status: DC
  Filled 2021-01-16: qty 30, 30d supply, fill #0
  Filled 2021-02-16: qty 30, 30d supply, fill #1

## 2021-01-16 MED ORDER — AMLODIPINE BESYLATE 5 MG PO TABS
5.0000 mg | ORAL_TABLET | Freq: Every day | ORAL | 1 refills | Status: DC
  Filled 2021-02-16: qty 30, 30d supply, fill #0

## 2021-01-16 MED ORDER — ARIPIPRAZOLE 10 MG PO TABS
ORAL_TABLET | ORAL | 1 refills | Status: DC
  Filled 2021-01-16: qty 30, 30d supply, fill #0
  Filled 2021-02-16: qty 30, 30d supply, fill #1

## 2021-01-16 MED ORDER — MELATONIN 3 MG PO TABS: ORAL_TABLET | ORAL | 1 refills | Status: DC

## 2021-01-16 MED ORDER — ALBUTEROL SULFATE HFA 108 (90 BASE) MCG/ACT IN AERS
INHALATION_SPRAY | RESPIRATORY_TRACT | 1 refills | Status: DC
  Filled 2021-01-16: qty 18, 30d supply, fill #0

## 2021-01-16 MED ORDER — METFORMIN HCL 1000 MG PO TABS
ORAL_TABLET | ORAL | 1 refills | Status: DC
  Filled 2021-01-16: qty 60, 30d supply, fill #0
  Filled 2021-02-16: qty 60, 30d supply, fill #1

## 2021-01-16 MED ORDER — VENLAFAXINE HCL ER 37.5 MG PO CP24
ORAL_CAPSULE | ORAL | 1 refills | Status: DC
  Filled 2021-01-16: qty 60, 30d supply, fill #0
  Filled 2021-02-16: qty 60, 30d supply, fill #1

## 2021-01-16 MED ORDER — ROSUVASTATIN CALCIUM 5 MG PO TABS
ORAL_TABLET | ORAL | 1 refills | Status: DC
  Filled 2021-01-16: qty 30, 30d supply, fill #0
  Filled 2021-02-16: qty 30, 30d supply, fill #1

## 2021-01-17 ENCOUNTER — Other Ambulatory Visit: Payer: Self-pay

## 2021-01-18 ENCOUNTER — Other Ambulatory Visit: Payer: Self-pay

## 2021-01-22 DIAGNOSIS — I70219 Atherosclerosis of native arteries of extremities with intermittent claudication, unspecified extremity: Secondary | ICD-10-CM | POA: Insufficient documentation

## 2021-01-22 NOTE — Progress Notes (Signed)
MRN : [id number]  Timothy [name]. is a 60 y.o. (12-16-1960) male who presents with chief complaint of follow up for leg blockages.  History of Present Illness:   The patient returns to the office for followup and review status post angiogram with intervention on 03/18/2020.   Procedure: Percutaneous transluminal angioplasty and stent placement left superficial femoral artery. Percutaneous transluminal angioplasty of the left peroneal and tibioperoneal trunk to 4 mm with a [employer] drug-eluting balloon  The patient notes improvement in the lower extremity symptoms. No interval shortening of the patient's claudication distance or rest pain symptoms. Previous wounds have now healed.  No new ulcers or wounds have occurred since the last visit.  Since the patient's last visit he has had a significant change in his overall health care.  He is now status post a partial pneumonectomy secondary to an empyema.  He notes that ever since the surgery he is having significant increase in the pain in his legs particularly when he stands and he is now describing a numbness that radiates down the lateral aspect of his left leg from his hip to the knee.  He is also now on home oxygen.  Additionally, as a postoperative complication he developed a DVT in the left calf and it was started on Eliquis.  This was initiated in March 2022  The patient denies amaurosis fugax or recent TIA symptoms. There are no recent neurological changes noted. The patient denies history of DVT, PE or superficial thrombophlebitis. The patient denies recent episodes of angina or shortness of breath.   ABI's Rt=1.19 and Lt=1.15  (previous ABI's Rt=1.08 and Lt=1.11) Duplex US of the left lower extremity arterial system shows widely patent SFA stent   No outpatient medications have been marked as taking for the 01/23/21 encounter (Appointment) with Gilda Crease, Latina Craver, MD.    Past Medical History:  Diagnosis Date   Diabetes  mellitus without complication (HCC)    Hypercholesteremia    Hypertension     Past Surgical History:  Procedure Laterality Date   AMPUTATION Left 03/22/2020   Procedure: AMPUTATION RAY-1st Ray Left Foot;  Surgeon: Linus Galas, [name];  Location: [facility] ORS;  Service: Podiatry;  Laterality: Left;   AMPUTATION TOE Left 03/16/2020   Procedure: AMPUTATION GREAT TOE;  Surgeon: Linus Galas, [name];  Location: [facility] ORS;  Service: Podiatry;  Laterality: Left;   COLONOSCOPY WITH PROPOFOL N/A 12/22/2019   Procedure: COLONOSCOPY WITH PROPOFOL;  Surgeon: Midge Minium, MD;  Location: [facility];  Service: Endoscopy;  Laterality: N/A;   LOWER EXTREMITY ANGIOGRAPHY Left 03/18/2020   Procedure: Lower Extremity Angiography;  Surgeon: Renford Dills, MD;  Location: [facility] INVASIVE CV LAB;  Service: Cardiovascular;  Laterality: Left;   TONSILLECTOMY     VIDEO ASSISTED THORACOSCOPY (VATS)/DECORTICATION Left 08/15/2020   Procedure: VIDEO ASSISTED THORACOSCOPY (VATS)/DECORTICATION;  Surgeon: Corliss Skains, MD;  Location: [facility] OR;  Service: Thoracic;  Laterality: Left;   WISDOM TOOTH EXTRACTION      Social History Social History   Tobacco Use   Smoking status: Every Day    Packs/day: 0.50    Years: 41.00    Pack years: 20.50    Types: Cigarettes   Smokeless tobacco: Never   Tobacco comments:    4-5 cigs per day 12/14/2020  Vaping Use   Vaping Use: Never used  Substance Use Topics   Alcohol use: Yes    Alcohol/week: 6.0 standard drinks    Types: 6 Cans of beer per week  Comment: occasionally    Drug use: Not Currently    Family History Family History  Family history unknown: Yes    Allergies  Allergen Reactions   Bee Venom Anaphylaxis   Cheese Anaphylaxis, Swelling and Other (See Comments)    Blue Cheese only   Coffea Arabica Shortness Of Breath   Coffee Flavor Shortness Of Breath     REVIEW OF SYSTEMS (Negative unless checked)  Constitutional: [] Weight loss  [] Fever   [] Chills Cardiac: [] Chest pain   [] Chest pressure   [] Palpitations   [] Shortness of breath when laying flat   [x] Shortness of breath with exertion. Vascular:  [x] Pain in legs with walking   [x] Pain in legs at rest  [] History of DVT   [] Phlebitis   [] Swelling in legs   [] Varicose veins   [] Non-healing ulcers Pulmonary:   [] Uses home oxygen   [] Productive cough   [] Hemoptysis   [] Wheeze  [] COPD   [] Asthma Neurologic:  [] Dizziness   [] Seizures   [] History of stroke   [] History of TIA  [] Aphasia   [] Vissual changes   [] Weakness or numbness in arm   [x] Weakness or numbness in leg Musculoskeletal:   [] Joint swelling   [] Joint pain   [] Low back pain Hematologic:  [] Easy bruising  [] Easy bleeding   [] Hypercoagulable state   [] Anemic Gastrointestinal:  [] Diarrhea   [] Vomiting  [] Gastroesophageal reflux/heartburn   [] Difficulty swallowing. Genitourinary:  [] Chronic kidney disease   [] Difficult urination  [] Frequent urination   [] Blood in urine Skin:  [] Rashes   [] Ulcers  Psychological:  [] History of anxiety   []  History of major depression.  Physical Examination  There were no vitals filed for this visit. There is no height or weight on file to calculate BMI. Gen: WD/WN, NAD Head: [place]/AT, No temporalis wasting.  Ear/Nose/Throat: Hearing grossly intact, nares w/o erythema or drainage Eyes: PER, EOMI, sclera nonicteric.  Neck: Supple, no masses.  No bruit or JVD.  Pulmonary:  Good air movement, no audible wheezing, no use of accessory muscles.  Cardiac: RRR, normal S1, S2, no Murmurs. Vascular:   Vessel Right Left  Radial Palpable Palpable  PT Palpable Palpable  DP Palpable Palpable  Gastrointestinal: soft, non-distended. No guarding/no peritoneal signs.  Musculoskeletal: M/S 5/5 throughout.  No visible deformity.  Neurologic: CN 2-12 intact. Pain and light touch intact in extremities.  Symmetrical.  Speech is fluent. Motor exam as listed above. Psychiatric: Judgment intact, Mood & affect  appropriate for pt's clinical situation. Dermatologic: No rashes or ulcers noted.  No changes consistent with cellulitis.   CBC Lab Results  Component Value Date   WBC 9.1 12/18/2020   HGB 15.1 12/18/2020   HCT 42.9 12/18/2020   MCV 91.9 12/18/2020   PLT 234 12/18/2020    BMET    Component Value Date/Time   NA 137 12/18/2020 1023   NA 134 06/22/2020 1058   K 4.3 12/18/2020 1023   CL 101 12/18/2020 1023   CO2 25 12/18/2020 1023   GLUCOSE 243 (H) 12/18/2020 1023   BUN 16 12/18/2020 1023   BUN 8 06/22/2020 1058   CREATININE 0.96 12/18/2020 1023   CALCIUM 9.3 12/18/2020 1023   GFRNONAA >60 12/18/2020 1023   GFRAA 107 06/22/2020 1058   CrCl cannot be calculated (Patient's most recent lab result is older than the maximum 21 days allowed.).  COAG Lab Results  Component Value Date   INR 1.2 08/14/2020   INR 0.9 03/16/2020    Radiology No results found.   Assessment/Plan 1. Atherosclerosis  of native artery of both lower extremities with intermittent claudication (HCC)  Recommend:  The patient has evidence of atherosclerosis of the lower extremities with claudication.  The patient does not voice lifestyle limiting changes at this point in time.  Noninvasive studies do not suggest clinically significant change.  No invasive studies, angiography or surgery at this time The patient should continue walking and begin a more formal exercise program.  The patient should continue antiplatelet therapy and aggressive treatment of the lipid abnormalities  No changes in the patient's medications at this time  The patient should continue wearing graduated compression socks 10-15 mmHg strength to control the mild edema.    2. Pain in both lower extremities  Recommend:  The patient has atypical pain symptoms for pure atherosclerotic disease.   Noninvasive studies including ABI's and left leg arterial ultrasound show a widely patent arterial system.  I suspect the patient is  c/o pseudoclaudication.  Patient should have an evaluation of his LS spine and I will ask Dr Myer Haff to see him.  The patient should continue walking and begin a more formal exercise program. The patient should continue his antiplatelet therapy and aggressive treatment of the lipid abnormalities.  - Ambulatory referral to Neurosurgery  3. Acute deep vein thrombosis (DVT) of calf muscle vein of left lower extremity (HCC) We will see him back in September repeat his duplex ultrasound and possibly stop his Eliquis at that time - VAS [place] LOWER EXTREMITY VENOUS (DVT); Future  4. Essential hypertension Continue antihypertensive medications as already ordered, these medications have been reviewed and there are no changes at this time.   5. Chronic diastolic CHF (congestive heart failure) (HCC) Continue cardiac and antihypertensive medications as already ordered and reviewed, no changes at this time.  Continue statin as ordered and reviewed, no changes at this time  Nitrates PRN for chest pain   6. Type 2 diabetes mellitus with hyperglycemia, without long-term current use of insulin (HCC) Continue hypoglycemic medications as already ordered, these medications have been reviewed and there are no changes at this time.  Hgb A1C to be monitored as already arranged by primary service    [name], MD  01/22/2021 2:56 PM

## 2021-01-23 ENCOUNTER — Ambulatory Visit (INDEPENDENT_AMBULATORY_CARE_PROVIDER_SITE_OTHER): Payer: Medicaid Other | Admitting: Vascular Surgery

## 2021-01-23 ENCOUNTER — Ambulatory Visit (INDEPENDENT_AMBULATORY_CARE_PROVIDER_SITE_OTHER): Payer: Medicaid Other

## 2021-01-23 ENCOUNTER — Encounter (INDEPENDENT_AMBULATORY_CARE_PROVIDER_SITE_OTHER): Payer: Self-pay | Admitting: Vascular Surgery

## 2021-01-23 ENCOUNTER — Other Ambulatory Visit: Payer: Self-pay

## 2021-01-23 VITALS — BP 130/84 | HR 91 | Ht 70.0 in | Wt 195.0 lb

## 2021-01-23 DIAGNOSIS — M79606 Pain in leg, unspecified: Secondary | ICD-10-CM | POA: Insufficient documentation

## 2021-01-23 DIAGNOSIS — I82402 Acute embolism and thrombosis of unspecified deep veins of left lower extremity: Secondary | ICD-10-CM | POA: Insufficient documentation

## 2021-01-23 DIAGNOSIS — J869 Pyothorax without fistula: Secondary | ICD-10-CM

## 2021-01-23 DIAGNOSIS — M79605 Pain in left leg: Secondary | ICD-10-CM

## 2021-01-23 DIAGNOSIS — I82462 Acute embolism and thrombosis of left calf muscular vein: Secondary | ICD-10-CM

## 2021-01-23 DIAGNOSIS — I70213 Atherosclerosis of native arteries of extremities with intermittent claudication, bilateral legs: Secondary | ICD-10-CM | POA: Diagnosis not present

## 2021-01-23 DIAGNOSIS — I739 Peripheral vascular disease, unspecified: Secondary | ICD-10-CM

## 2021-01-23 DIAGNOSIS — M79604 Pain in right leg: Secondary | ICD-10-CM

## 2021-01-23 DIAGNOSIS — I10 Essential (primary) hypertension: Secondary | ICD-10-CM

## 2021-01-23 DIAGNOSIS — I5032 Chronic diastolic (congestive) heart failure: Secondary | ICD-10-CM

## 2021-01-23 DIAGNOSIS — E1165 Type 2 diabetes mellitus with hyperglycemia: Secondary | ICD-10-CM

## 2021-02-01 ENCOUNTER — Other Ambulatory Visit: Payer: Self-pay

## 2021-02-01 MED FILL — Albuterol Sulfate Inhal Aero 108 MCG/ACT (90MCG Base Equiv): RESPIRATORY_TRACT | 50 days supply | Qty: 8.5 | Fill #2 | Status: CN

## 2021-02-01 MED FILL — Albuterol Sulfate Inhal Aero 108 MCG/ACT (90MCG Base Equiv): RESPIRATORY_TRACT | 100 days supply | Qty: 17 | Fill #2 | Status: AC

## 2021-02-02 ENCOUNTER — Other Ambulatory Visit: Payer: Self-pay

## 2021-02-09 ENCOUNTER — Other Ambulatory Visit (HOSPITAL_COMMUNITY): Payer: Self-pay | Admitting: Neurosurgery

## 2021-02-09 ENCOUNTER — Other Ambulatory Visit: Payer: Self-pay | Admitting: Neurosurgery

## 2021-02-09 ENCOUNTER — Other Ambulatory Visit: Payer: Self-pay

## 2021-02-09 DIAGNOSIS — M5416 Radiculopathy, lumbar region: Secondary | ICD-10-CM

## 2021-02-09 DIAGNOSIS — R200 Anesthesia of skin: Secondary | ICD-10-CM

## 2021-02-16 ENCOUNTER — Other Ambulatory Visit: Payer: Self-pay

## 2021-02-16 ENCOUNTER — Other Ambulatory Visit: Payer: Self-pay | Admitting: Gerontology

## 2021-02-16 MED ORDER — GLIPIZIDE 10 MG PO TABS
ORAL_TABLET | ORAL | 1 refills | Status: DC
  Filled 2021-02-16: qty 30, 30d supply, fill #0

## 2021-02-17 ENCOUNTER — Other Ambulatory Visit: Payer: Self-pay

## 2021-02-18 ENCOUNTER — Ambulatory Visit: Payer: Medicaid Other

## 2021-02-23 ENCOUNTER — Ambulatory Visit: Admission: RE | Admit: 2021-02-23 | Payer: Medicaid Other | Source: Ambulatory Visit

## 2021-03-06 ENCOUNTER — Ambulatory Visit: Payer: Medicaid Other

## 2021-03-13 ENCOUNTER — Ambulatory Visit (INDEPENDENT_AMBULATORY_CARE_PROVIDER_SITE_OTHER): Payer: Medicaid Other | Admitting: Vascular Surgery

## 2021-03-13 ENCOUNTER — Encounter (INDEPENDENT_AMBULATORY_CARE_PROVIDER_SITE_OTHER): Payer: Medicaid Other

## 2021-03-23 ENCOUNTER — Ambulatory Visit (INDEPENDENT_AMBULATORY_CARE_PROVIDER_SITE_OTHER): Payer: Medicaid Other | Admitting: Vascular Surgery

## 2021-03-23 ENCOUNTER — Encounter (INDEPENDENT_AMBULATORY_CARE_PROVIDER_SITE_OTHER): Payer: Medicaid Other

## 2021-03-23 ENCOUNTER — Encounter (INDEPENDENT_AMBULATORY_CARE_PROVIDER_SITE_OTHER): Payer: Self-pay | Admitting: Vascular Surgery

## 2021-03-24 ENCOUNTER — Telehealth: Payer: Self-pay | Admitting: Pharmacy Technician

## 2021-03-24 NOTE — Telephone Encounter (Signed)
Patient has Medicaid with prescription drug coverage.  No longer meets [facility]'s eligibility criteria.  Timothy [name] Care Manager [facility]     [address] [place], [place]  [address]   March 23, 2021    Timothy [name] [address] [place], [place]  [address]  Dear Timothy [name]:  This is to inform you that you are no longer eligible to receive medication assistance at [facility].  The reason(s) are:    _____Your total gross monthly household income exceeds 250% of the Federal Poverty Level.   _____Tangible assets (savings, checking, stocks/bonds, pension, retirement, etc.) exceeds our limit  __X__You are eligible to receive benefits from [facility], [facility] or HIV Medication            Assistance program _____You are eligible to receive benefits from a Medicare Part "D" plan __X__You have prescription insurance with [place] [employer] _____You are not an [employer] resident _____Failure to provide all requested documentation (proof of income information for 2022., and/or Patient Intake Application, DOH Attestation, Contract, etc).    We regret that we are unable to help you at this time.  If your prescription coverage is terminated, please contact [facility], so that we may reassess your eligibility for our program.  If you have questions, we may be contacted at [phone].  Thank you,  [facility]

## 2021-03-30 ENCOUNTER — Telehealth: Payer: Self-pay | Admitting: Pulmonary Disease

## 2021-03-30 NOTE — Telephone Encounter (Signed)
Patient stated that 10 days ago, he developed a head cold that moved into his chest. C/o prod cough with yellow to green sputum, increased sob, wheezing with laying flat, chills, sweats and chest congestion.  He is using Stiolto once daily,  albuterol HFA 10x daily and albuterol solution BID. He wears 3L QHS and PRN. He does not monitor spo2. No recent covid test.  2 covid vaccines.   Dr. Jayme Cloud, please advise. Thanks

## 2021-03-30 NOTE — Telephone Encounter (Signed)
He needs to be seen in the emergency room I suspect he may have pneumonia again.

## 2021-03-30 NOTE — Telephone Encounter (Signed)
Lm for patient.  

## 2021-03-30 NOTE — Telephone Encounter (Signed)
Patient is aware of below message and voiced his understanding.  Nothing further needed at this time.   

## 2021-04-10 ENCOUNTER — Other Ambulatory Visit: Payer: Self-pay

## 2021-04-10 ENCOUNTER — Ambulatory Visit
Admission: RE | Admit: 2021-04-10 | Discharge: 2021-04-10 | Disposition: A | Discharge status: Home / Self Care | Payer: Medicaid Other | Source: Ambulatory Visit | Attending: Neurosurgery | Admitting: Neurosurgery

## 2021-04-10 DIAGNOSIS — M5416 Radiculopathy, lumbar region: Secondary | ICD-10-CM | POA: Diagnosis not present

## 2021-04-10 DIAGNOSIS — R200 Anesthesia of skin: Secondary | ICD-10-CM | POA: Diagnosis present

## 2021-04-17 ENCOUNTER — Telehealth (INDEPENDENT_AMBULATORY_CARE_PROVIDER_SITE_OTHER): Payer: Self-pay | Admitting: Vascular Surgery

## 2021-04-17 ENCOUNTER — Ambulatory Visit (INDEPENDENT_AMBULATORY_CARE_PROVIDER_SITE_OTHER): Payer: Medicaid Other | Admitting: Vascular Surgery

## 2021-04-17 ENCOUNTER — Ambulatory Visit (INDEPENDENT_AMBULATORY_CARE_PROVIDER_SITE_OTHER): Payer: Medicaid Other

## 2021-04-17 ENCOUNTER — Encounter (INDEPENDENT_AMBULATORY_CARE_PROVIDER_SITE_OTHER): Payer: Self-pay | Admitting: Vascular Surgery

## 2021-04-17 ENCOUNTER — Other Ambulatory Visit: Payer: Self-pay

## 2021-04-17 VITALS — BP 103/69 | HR 87 | Resp 16 | Wt 196.0 lb

## 2021-04-17 DIAGNOSIS — I10 Essential (primary) hypertension: Secondary | ICD-10-CM

## 2021-04-17 DIAGNOSIS — M47817 Spondylosis without myelopathy or radiculopathy, lumbosacral region: Secondary | ICD-10-CM

## 2021-04-17 DIAGNOSIS — I82462 Acute embolism and thrombosis of left calf muscular vein: Secondary | ICD-10-CM | POA: Diagnosis not present

## 2021-04-17 DIAGNOSIS — M869 Osteomyelitis, unspecified: Secondary | ICD-10-CM

## 2021-04-17 DIAGNOSIS — I7025 Atherosclerosis of native arteries of other extremities with ulceration: Secondary | ICD-10-CM

## 2021-04-17 DIAGNOSIS — E118 Type 2 diabetes mellitus with unspecified complications: Secondary | ICD-10-CM

## 2021-04-17 NOTE — Telephone Encounter (Signed)
Documentation only.

## 2021-04-17 NOTE — Progress Notes (Signed)
MRN : [id number]  Timothy [name]. is a 60 y.o. (26-Dec-1960) male who presents with chief complaint of check leg.  History of Present Illness:   The patient is seen for evaluation of painful lower extremities and diminished pulses associated with ulceration of the right foot.  The patient notes the ulcer has been present for multiple weeks.  Initially its seemed like the toe had gotten bigger and was more painful but then per his description it drained copious amounts of pus that soiled multiple socks and since that time it has been less painful and somewhat smaller.  However, he notes that it remains a bluish cyanotic color and is still quite bulbous and he himself has voiced concerns about possible bone infection.  No specific history of trauma noted by the patient.  The patient denies fever or chills.  The patient does have diabetes which has been difficult to control.  Patient notes prior to the ulcer developing the extremities were painful particularly with ambulation or activity and the discomfort is very consistent day today.  Indeed he is status post intervention left lower extremity for similar circumstances.  The patient denies rest pain or dangling of an extremity off the side of the bed during the night for relief. No prior interventions or surgeries.  Additionally one of his presenting complaints from his initial evaluation was numbness and painful dysesthesias of both lateral thigh areas beginning at the level of the groin and extending down to the knee.  This has persisted.  There is a history of back problems or DJD of the lumbar sacral spine.  Recent MRI of the lumbar sacral spine demonstrates severe disease at the L5-S1 level with moderate to severe disease at the L4-L5 level.  The patient denies amaurosis fugax or recent TIA symptoms. There are no recent neurological changes noted. The patient denies history of DVT, PE or superficial thrombophlebitis. The patient denies  recent episodes of angina or shortness of breath.    Current Meds  Medication Sig   acetaminophen (TYLENOL) 650 MG CR tablet Take 1,300 mg by mouth every 8 (eight) hours as needed for pain.   albuterol (VENTOLIN HFA) 108 (90 Base) MCG/ACT inhaler INHALE ONE PUFF INTO THE LUNGS EVERY 6 HOURS AS NEEDED FOR WHEEZING OR SHORTNESS OF BREATH. (Patient taking differently: Inhale 1-2 puffs into the lungs See admin instructions. Inhale 1-2 puffs into the lungs every four to six hours as needed for shortness of breath or wheezing)   albuterol (VENTOLIN HFA) 108 (90 Base) MCG/ACT inhaler INHALE 1 PUFF INTO THE LUNGS ONCE EVERY 6 HOURS AS NEEDED FOR SHORTNESS OF BREATH OR WHEEZING.   amLODipine (NORVASC) 5 MG tablet Take 1 tablet (5 mg total) by mouth once daily.   apixaban (ELIQUIS) 5 MG TABS tablet TAKE ONE TABLET ($RemoveBef'5MG'fnTykojbqp$  TOTAL) BY MOUTH TWICE DAILY.   ARIPiprazole (ABILIFY) 10 MG tablet TAKE ONE TABLET ($RemoveBef'10MG'oGiGpPyDRA$  TOTAL) BY MOUTH ONCE DAILY AT BEDTIME.   blood glucose meter kit and supplies KIT Dispense based on patient and insurance preference. Use up to four times daily as directed. (FOR ICD-9 250.00, 250.01).   cloNIDine (CATAPRES) 0.1 MG tablet Take one tablet ( 0.$RemoveBe'1mg'QqWROjyZB$  total) by mouth every day at bedtime.   doxycycline (VIBRA-TABS) 100 MG tablet Take 1 tablet (100 mg total) by mouth 2 (two) times daily.   gabapentin (NEURONTIN) 400 MG capsule TAKE ONE CAPSULE ($RemoveBefo'400MG'BdtpJWmfJlJ$  TOTAL) BY MOUTH THREE TIMES DAILY.   glipiZIDE (GLUCOTROL) 10 MG tablet TAKE ONE TABLET (  $'10MG'T$  TOTAL) BY MOUTH TWICE DAILY.   guaiFENesin (MUCINEX) 600 MG 12 hr tablet Take 1 tablet (600 mg total) by mouth 2 (two) times daily as needed for to loosen phlegm or cough.   magnesium oxide (MAG-OX) 400 MG tablet Take 800 mg by mouth 2 (two) times daily.   melatonin 3 MG TABS tablet TAKE 2 TABLETS ($RemoveBe'6MG'kjSdEoPje$  TOTAL) BY MOUTH ONCE DAILY AT BEDTIME.   metFORMIN (GLUCOPHAGE) 1000 MG tablet TAKE ONE TABLET (1,$RemoveBefor'000MG'ayGAsoXlDtXM$  TOTAL) BY MOUTH TWICE DAILY.   nicotine (NICODERM CQ  - DOSED IN MG/24 HOURS) 21 mg/24hr patch Place 21 mg onto the skin daily.   pantoprazole (PROTONIX) 40 MG tablet TAKE ONE TABLET ($RemoveBef'40MG'KvyxWanBoH$  TOTAL) BY MOUTH ONCE DAILY.   rosuvastatin (CRESTOR) 5 MG tablet TAKE ONE TABLET ($RemoveBef'5MG'mVUQEHpwcv$  TOTAL) BY MOUTH ONCE DAILY.   Tiotropium Bromide-Olodaterol (STIOLTO RESPIMAT) 2.5-2.5 MCG/ACT AERS Inhale 2 puffs into the lungs once daily.   venlafaxine XR (EFFEXOR XR) 37.5 MG 24 hr capsule TAKE 2 CAPSULES ($RemoveBef'75MG'QZWbbHfiJf$  TOTAL) BY MOUTH ONCE DAILY.    Past Medical History:  Diagnosis Date   Diabetes mellitus without complication ([place])    Hypercholesteremia    Hypertension     Past Surgical History:  Procedure Laterality Date   AMPUTATION Left 03/22/2020   Procedure: AMPUTATION RAY-1st Ray Left Foot;  Surgeon: [name], [name];  Location: [facility] ORS;  Service: Podiatry;  Laterality: Left;   AMPUTATION TOE Left 03/16/2020   Procedure: AMPUTATION GREAT TOE;  Surgeon: [name], [name];  Location: [facility];  Service: Podiatry;  Laterality: Left;   COLONOSCOPY WITH PROPOFOL N/A 12/22/2019   Procedure: COLONOSCOPY WITH PROPOFOL;  Surgeon: [name], MD;  Location: [facility];  Service: Endoscopy;  Laterality: N/A;   LOWER EXTREMITY ANGIOGRAPHY Left 03/18/2020   Procedure: Lower Extremity Angiography;  Surgeon: [name], MD;  Location: [facility];  Service: Cardiovascular;  Laterality: Left;   TONSILLECTOMY     VIDEO ASSISTED THORACOSCOPY (VATS)/DECORTICATION Left 08/15/2020   Procedure: VIDEO ASSISTED THORACOSCOPY (VATS)/DECORTICATION;  Surgeon: [name], MD;  Location: [facility] OR;  Service: Thoracic;  Laterality: Left;   WISDOM TOOTH EXTRACTION      Social History Social History   Tobacco Use   Smoking status: Every Day    Packs/day: 0.50    Years: 41.00    Pack years: 20.50    Types: Cigarettes   Smokeless tobacco: Never   Tobacco comments:    4-5 cigs per day 12/14/2020  Vaping Use   Vaping Use: Never used  Substance Use Topics   Alcohol  use: Yes    Alcohol/week: 6.0 standard drinks    Types: 6 Cans of beer per week    Comment: occasionally    Drug use: Not Currently    Family History Family History  Family history unknown: Yes    Allergies  Allergen Reactions   Bee Venom Anaphylaxis   Cheese Anaphylaxis, Swelling and Other (See Comments)    Blue Cheese only   Coffea Arabica Shortness Of Breath   Coffee Flavor Shortness Of Breath     REVIEW OF SYSTEMS (Negative unless checked)  Constitutional: $RemoveBeforeDE'[]'ddCAKqTzXeCkrDa$ Weight loss  $Rem'[]'Lobt$ Fever  $Remo'[]'LgOYt$ Chills Cardiac: $RemoveBeforeD'[]'ywydxmKxrqKdeX$ Chest pain   '[]'$ Chest pressure   '[]'$ Palpitations   '[]'$ Shortness of breath when laying flat   '[]'$ Shortness of breath with exertion. Vascular:  $RemoveBe'[]'uRryizGcX$ Pain in legs with walking   '[x]'$ Pain in legs at rest  $Rem'[]'lSfW$ History of DVT   '[]'$ Phlebitis   '[x]'$ Swelling in legs   '[]'$ Varicose veins   '[]'$ Non-healing  ulcers Pulmonary:   [] Uses home oxygen   [] Productive cough   [] Hemoptysis   [] Wheeze  [] COPD   [] Asthma Neurologic:  [] Dizziness   [] Seizures   [] History of stroke   [] History of TIA  [] Aphasia   [] Vissual changes   [] Weakness or numbness in arm   [] Weakness or numbness in leg Musculoskeletal:   [] Joint swelling   [] Joint pain   [] Low back pain Hematologic:  [] Easy bruising  [] Easy bleeding   [] Hypercoagulable state   [] Anemic Gastrointestinal:  [] Diarrhea   [] Vomiting  [] Gastroesophageal reflux/heartburn   [] Difficulty swallowing. Genitourinary:  [] Chronic kidney disease   [] Difficult urination  [] Frequent urination   [] Blood in urine Skin:  [] Rashes   [] Ulcers  Psychological:  [] History of anxiety   []  History of major depression.  Physical Examination  Vitals:   04/17/21 1523  BP: 103/69  Pulse: 87  Resp: 16  Weight: 196 lb (88.9 kg)   Body mass index is 28.12 kg/m. Gen: WD/WN, NAD Head: [place]/AT, No temporalis wasting.  Ear/Nose/Throat: Hearing grossly intact, nares w/o erythema or drainage, pinna without lesions Eyes: PER, EOMI, sclera nonicteric.  Neck: Supple, no gross masses.  No JVD.   Pulmonary:  Good air movement, no audible wheezing, no use of accessory muscles.  Cardiac: RRR, precordium not hyperdynamic. Vascular:  scattered varicosities present bilaterally.  Moderate venous stasis changes to the legs bilaterally.  2+ firm non-pitting edema right third toe is quite bulbous with changes characteristic for chronic osteomyelitis ulceration at the tip and cyanotic discoloration Vessel Right Left  Radial Palpable Palpable  DP Not palpable Not palpable  PT Not palpable Not palpable  Gastrointestinal: soft, non-distended. No guarding/no peritoneal signs.  Musculoskeletal: M/S 5/5 throughout.  No deformity.  Neurologic: CN 2-12 intact. Pain and light touch intact in extremities.  Symmetrical.  Speech is fluent. Motor exam as listed above. Psychiatric: Judgment intact, Mood & affect appropriate for pt's clinical situation. Dermatologic: Venous rashes no ulcers noted.  + changes consistent with infection of the right third toe. Lymph : No lichenification or skin changes of chronic lymphedema.  CBC Lab Results  Component Value Date   WBC 9.1 12/18/2020   HGB 15.1 12/18/2020   HCT 42.9 12/18/2020   MCV 91.9 12/18/2020   PLT 234 12/18/2020    BMET    Component Value Date/Time   NA 137 12/18/2020 1023   NA 134 06/22/2020 1058   K 4.3 12/18/2020 1023   CL 101 12/18/2020 1023   CO2 25 12/18/2020 1023   GLUCOSE 243 (H) 12/18/2020 1023   BUN 16 12/18/2020 1023   BUN 8 06/22/2020 1058   CREATININE 0.96 12/18/2020 1023   CALCIUM 9.3 12/18/2020 1023   GFRNONAA >60 12/18/2020 1023   GFRAA 107 06/22/2020 1058   CrCl cannot be calculated (Patient's most recent lab result is older than the maximum 21 days allowed.).  COAG Lab Results  Component Value Date   INR 1.2 08/14/2020   INR 0.9 03/16/2020    Radiology MR LUMBAR SPINE WO CONTRAST  Result Date: 04/12/2021 CLINICAL DATA:  60 year old male with low back pain radiating to the left leg with numbness since  February. No known injury. EXAM: MRI LUMBAR SPINE WITHOUT CONTRAST TECHNIQUE: Multiplanar, multisequence MR imaging of the lumbar spine was performed. No intravenous contrast was administered. COMPARISON:  Lumbar radiographs 05/08/2019. CT Abdomen and Pelvis 08/11/2020. FINDINGS: Segmentation:  Normal on the comparisons. Alignment: Mild straightening of lumbar lordosis since 2020. Mild degenerative retrolisthesis of L5 on S1.  Vertebrae: Chronic degenerative endplate marrow signal changes at L5-S1 including chronic inferior L5 Schmorl's node. Some vacuum phenomena was associated on the CT earlier this year. No marrow edema or evidence of acute osseous abnormality. Normal background bone marrow signal. Intact visible sacrum and SI joints. Conus medullaris and cauda equina: Conus extends to the L1 level. No lower spinal cord or conus signal abnormality. Normal cauda equina nerve roots. Paraspinal and other soft tissues: Visualized abdominal viscera and paraspinal soft tissues are within normal limits. Disc levels: T11-T12: Partially visible, grossly negative. T12-L1:  Negative. L1-L2: Subtle disc desiccation and circumferential disc bulge. No stenosis. L2-L3: Mild disc desiccation. Subtle circumferential disc bulge. No stenosis. L3-L4: Mild far lateral disc bulging. Mild facet and ligament flavum hypertrophy. Mild epidural lipomatosis. No spinal stenosis. Borderline to mild bilateral L3 neural foraminal stenosis. L4-L5: Mild circumferential disc bulge. Mild endplate spurring. Mild to moderate facet and ligament flavum hypertrophy. Mild epidural lipomatosis. No spinal or convincing lateral recess stenosis. Mild to moderate L4 neural foraminal stenosis appears symmetric. L5-S1: Chronic disc space loss and vacuum disc. Circumferential disc osteophyte complex. Moderate bilateral facet hypertrophy. Mild epidural lipomatosis. Small central component of disc (series 8, image 34) but no spinal or lateral recess stenosis. There  is moderate to severe bilateral L5 foraminal stenosis, although fairly symmetric. IMPRESSION: 1. 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 Superimposed moderate posterior element hypertrophy. Subsequent moderate to severe bilateral L5 neural foraminal stenosis, but fairly symmetric. Query left L5 radiculitis. 2. Generally mild for age lumbar spine degeneration elsewhere with no lumbar spinal stenosis. But up to moderate bilateral foraminal stenosis also at L4-L5 related to disc and posterior element degeneration. Query left L4 radiculitis. Electronically Signed   By: Genevie Ann M.D.   On: 04/12/2021 06:14     Assessment/Plan 1. Atherosclerosis of native arteries of the extremities with ulceration ([place]) Recommend:  Patient should undergo ABIs of the lower extremity ASAP because there has been a significant deterioration in the patient's lower extremity symptoms.  The patient states they are having increased pain and a marked decrease in the distance that they can walk.  The risks and benefits as well as the alternatives were discussed in detail with the patient.  All questions were answered.  Patient agrees to proceed and understands this could be a prelude to angiography and intervention.  The patient will follow up with me in the office to review the studies.   - VAS [place] ABI WITH/WO TBI; Future   2. DJD (degenerative joint disease), lumbosacral I have reviewed the results of his MRI with him.  His degenerative changes do correlate with his lateral thigh symptoms.  I will reengage with Dr. Nelly Laurence office.  - Ambulatory referral to Neurosurgery  3. Osteomyelitis of right foot, unspecified type ([place]) I will obtain plain x-rays to determine whether indeed there is chronic osteomyelitis as this might impact his treatment.  - DG Foot 2 Views Right; Future  4. Controlled type 2 diabetes mellitus with complication,  without long-term current use of insulin ([facility]) Continue hypoglycemic medications as already ordered, these medications have been reviewed and there are no changes at this time.  Hgb A1C to be monitored as already arranged by primary service   5. Essential hypertension Continue antihypertensive medications as already ordered, these medications have been reviewed and there are no changes at this time.     Hortencia Pilar, MD  04/17/2021 3:32  PM

## 2021-04-18 ENCOUNTER — Encounter (INDEPENDENT_AMBULATORY_CARE_PROVIDER_SITE_OTHER): Payer: Self-pay | Admitting: Vascular Surgery

## 2021-04-18 DIAGNOSIS — I7025 Atherosclerosis of native arteries of other extremities with ulceration: Secondary | ICD-10-CM | POA: Insufficient documentation

## 2021-04-18 DIAGNOSIS — M47817 Spondylosis without myelopathy or radiculopathy, lumbosacral region: Secondary | ICD-10-CM | POA: Insufficient documentation

## 2021-04-19 ENCOUNTER — Ambulatory Visit (INDEPENDENT_AMBULATORY_CARE_PROVIDER_SITE_OTHER): Payer: Medicaid Other | Admitting: Primary Care

## 2021-04-19 ENCOUNTER — Other Ambulatory Visit: Payer: Self-pay

## 2021-04-19 ENCOUNTER — Encounter: Payer: Self-pay | Admitting: Primary Care

## 2021-04-19 DIAGNOSIS — J9691 Respiratory failure, unspecified with hypoxia: Secondary | ICD-10-CM | POA: Diagnosis not present

## 2021-04-19 DIAGNOSIS — J441 Chronic obstructive pulmonary disease with (acute) exacerbation: Secondary | ICD-10-CM | POA: Diagnosis not present

## 2021-04-19 MED ORDER — DOXYCYCLINE HYCLATE 100 MG PO TABS: 100.0000 mg | ORAL_TABLET | Freq: Two times a day (BID) | ORAL | 0 refills | Status: DC

## 2021-04-19 MED ORDER — TRELEGY ELLIPTA 100-62.5-25 MCG/ACT IN AEPB: 100.0000 ug | INHALATION_SPRAY | Freq: Every day | RESPIRATORY_TRACT | 0 refills | Status: DC

## 2021-04-19 MED ORDER — PREDNISONE 10 MG PO TABS: ORAL_TABLET | ORAL | 0 refills | Status: DC

## 2021-04-19 NOTE — Progress Notes (Signed)
'@Patient'  ID: Timothy Main., male    DOB: 07-10-1960, 60 y.o.   MRN: [id number]  No chief complaint on file.   Referring provider: No ref. provider found  HPI: 60 year old male, current every day smoker. PMH significant for COPD, chronic diastolic heart failure, hypertension, empyema lung, respiratory failure with hypoxia, type 2 diabetes.  Patient of Dr. Patsey Berthold, last seen in office on 10/20/2020.  Previous [facility] pulmonary encounter: 10/20/20- Dr. Patsey Berthold  Patient is a 60 year old current smoker (half PPD) who presents for follow-up on COPD and dyspnea.  This is a scheduled appointment.  He was initially evaluated here in December 2021 with a subsequent visit in July 28, 2020.  At that time he had been on Trelegy Ellipta.  On February 23 he noticed that he had been having fevers chills increased shortness of breath and presented to the emergency room.  He was noted to have a large left pleural effusion that was loculated.  Empyema was confirmed and patient was transferred to [facility] for definitive surgical treatment.  He underwent video-assisted thoracoscopy with VATS decortication on 28 February.  He was discharged from [facility] on 8 March.  He did receive IV antibiotics as the organism was Streptococcus intermedius.  He was discharged on 3 L nasal cannula O2.  Since that time he has been decreasing his use of O2.  He actually presented to the clinic without O2 today.  He continues to have issues with shortness of breath.  His most recent chest x-ray shows significant pleural scarring on the left.  This will of course worsen his already compromised lung function.  Additionally he is going to have a stress test on 10 May 2 complete cardiac evaluation.  Some of his dyspnea may be related to cardiac disease.  He unfortunately continues to smoke half pack of cigarettes per day.   12/14/2020  Patient presents today for 6 week follow-up. Hx emphysema lungs, streptococcus immitis. He had  VATS pleurodesis in February 2022 and prolonged IV antibiotics therapy. Residual scarring expected. He was discharged on 3L oxygen and has been decreasing his use. Currently using as needed and at night. During last visit patient was changed from Trelegy to [employer]. He had a normal stress test in May with cardiology.   He is still awaiting prescription from medication management for Stiolto. He feels this worked better but had to resume trelegy in the mean time. He currently has a cough which started two weeks ago, he is getting up white-tan mucus. He is not taking any over the counter medication for cough. Wheezing is not bad during the day but worsens at night. He reports hx sleep apnea and daytime sleepiness. Epworth score 19. He is still smoking, he has cut back from 1-1.5 ppd to 5 cigarettes.   Sleep questionnaire: Previous sleep study- patient reports hx sleep apnea  Symptoms- Snoring, choking, restless sleep, daytimes sleepiness  Bedtime- 10-11pm Nocturnal awakenings- 4-6 times Time our of bed in morning- 7-8am Weight- up 20-30 lbs  Epworth- 19   04/19/2021- - interim hx  Patient presents today for 3 month follow-up. Patient developed head cold in early October which never cleared. He received no treatment. He still has a congested cough with brown mucus. Associated shortness of breath. He experiences chest tightness and wheezing when laying down at night. He is maintained on Stiolto and is using Albuterol 7-10x/day. He wears 3L oxygen at night and prn. Denies f/c/s. Needs CXR   DATA: 06/16/2020 PFT: FEV1  1.54 L or 42% predicted, FVC 2.54 L or 52% predicted, FEV1/FVC 61%, there is bronchodilator response with net change of 14% on FEV1 postbronchodilator.  There is concomitant restrictive physiology.  There is fluttering of the flow-volume loop consistent with possible OSA.  Diffusion capacity moderately reduced. 06/17/2019 one 2D echo: LVEF 60 to 65%, mildly dilated LA, no diastolic  dysfunction. 08/21/2020 chest CT: Residual left empyema/pleural rind    Allergies  Allergen Reactions   Bee Venom Anaphylaxis   Cheese Anaphylaxis, Swelling and Other (See Comments)    Blue Cheese only   Coffea Arabica Shortness Of Breath   Coffee Flavor Shortness Of Breath     There is no immunization history on file for this patient.  Past Medical History:  Diagnosis Date   Diabetes mellitus without complication ([place])    Hypercholesteremia    Hypertension     Tobacco History: Social History   Tobacco Use  Smoking Status Every Day   Packs/day: 2.00   Years: 41.00   Pack years: 82.00   Types: Cigarettes  Smokeless Tobacco Never  Tobacco Comments   10 ciggs or less 04/19/2021   Ready to quit: Not Answered Counseling given: Not Answered Tobacco comments: 10 ciggs or less 04/19/2021   Outpatient Medications Prior to Visit  Medication Sig Dispense Refill   acetaminophen (TYLENOL) 650 MG CR tablet Take 1,300 mg by mouth every 8 (eight) hours as needed for pain.     albuterol (VENTOLIN HFA) 108 (90 Base) MCG/ACT inhaler INHALE 1 PUFF INTO THE LUNGS ONCE EVERY 6 HOURS AS NEEDED FOR SHORTNESS OF BREATH OR WHEEZING. 18 g 1   amLODipine (NORVASC) 5 MG tablet Take 1 tablet (5 mg total) by mouth once daily. 30 tablet 1   apixaban (ELIQUIS) 5 MG TABS tablet TAKE ONE TABLET (5MG TOTAL) BY MOUTH TWICE DAILY. 60 tablet 1   ARIPiprazole (ABILIFY) 10 MG tablet TAKE ONE TABLET (10MG TOTAL) BY MOUTH ONCE DAILY AT BEDTIME. 30 tablet 1   blood glucose meter kit and supplies KIT Dispense based on patient and insurance preference. Use up to four times daily as directed. (FOR ICD-9 250.00, 250.01). 1 each 0   cloNIDine (CATAPRES) 0.1 MG tablet Take one tablet ( 0.64m total) by mouth every day at bedtime. 30 tablet 1   gabapentin (NEURONTIN) 400 MG capsule TAKE ONE CAPSULE (400MG TOTAL) BY MOUTH THREE TIMES DAILY. (Patient taking differently: Take 400 mg by mouth 4 (four) times daily.) 90  capsule 1   glipiZIDE (GLUCOTROL) 10 MG tablet TAKE ONE TABLET (10MG TOTAL) BY MOUTH TWICE DAILY. 30 tablet 1   guaiFENesin (MUCINEX) 600 MG 12 hr tablet Take 1 tablet (600 mg total) by mouth 2 (two) times daily as needed for to loosen phlegm or cough. 60 tablet 1   magnesium oxide (MAG-OX) 400 MG tablet Take 800 mg by mouth 2 (two) times daily.     melatonin 3 MG TABS tablet TAKE 2 TABLETS (6MG TOTAL) BY MOUTH ONCE DAILY AT BEDTIME. 60 tablet 1   metFORMIN (GLUCOPHAGE) 1000 MG tablet TAKE ONE TABLET (1,000MG TOTAL) BY MOUTH TWICE DAILY. 60 tablet 1   pantoprazole (PROTONIX) 40 MG tablet TAKE ONE TABLET (40MG TOTAL) BY MOUTH ONCE DAILY. 30 tablet 1   rosuvastatin (CRESTOR) 5 MG tablet TAKE ONE TABLET (5MG TOTAL) BY MOUTH ONCE DAILY. 30 tablet 1   Tiotropium Bromide-Olodaterol (STIOLTO RESPIMAT) 2.5-2.5 MCG/ACT AERS Inhale 2 puffs into the lungs once daily. 4 g 11   venlafaxine XR (EFFEXOR XR)  37.5 MG 24 hr capsule TAKE 2 CAPSULES (75MG TOTAL) BY MOUTH ONCE DAILY. 60 capsule 1   albuterol (VENTOLIN HFA) 108 (90 Base) MCG/ACT inhaler INHALE ONE PUFF INTO THE LUNGS EVERY 6 HOURS AS NEEDED FOR WHEEZING OR SHORTNESS OF BREATH. (Patient taking differently: Inhale 1-2 puffs into the lungs See admin instructions. Inhale 1-2 puffs into the lungs every four to six hours as needed for shortness of breath or wheezing) 8.5 g 11   doxycycline (VIBRA-TABS) 100 MG tablet Take 1 tablet (100 mg total) by mouth 2 (two) times daily. 14 tablet 0   nicotine (NICODERM CQ - DOSED IN MG/24 HOURS) 21 mg/24hr patch Place 21 mg onto the skin daily. (Patient not taking: Reported on 05/02/2021)     No facility-administered medications prior to visit.    Review of Systems  Review of Systems  Constitutional: Negative.   HENT: Negative.    Respiratory: Negative.    Cardiovascular: Negative.     Physical Exam  BP 110/80 (BP Location: Left Arm, Patient Position: Sitting, Cuff Size: Normal)   Pulse 99   Temp (!) 97.1 F  (36.2 C) (Oral)   Ht '5\' 10"'  (1.778 m)   Wt 191 lb 6.4 oz (86.8 kg)   SpO2 99%   BMI 27.46 kg/m  Physical Exam Constitutional:      Appearance: Normal appearance.  HENT:     Head: Normocephalic and atraumatic.  Cardiovascular:     Rate and Rhythm: Normal rate and regular rhythm.  Pulmonary:     Effort: Pulmonary effort is normal.     Breath sounds: Wheezing and rhonchi present.  Skin:    General: Skin is warm and dry.  Neurological:     General: No focal deficit present.     Mental Status: He is alert and oriented to person, place, and time. Mental status is at baseline.  Psychiatric:        Mood and Affect: Mood normal.        Behavior: Behavior normal.        Thought Content: Thought content normal.        Judgment: Judgment normal.     Lab Results:  CBC    Component Value Date/Time   WBC 9.1 12/18/2020 1023   RBC 4.67 12/18/2020 1023   HGB 15.1 12/18/2020 1023   HGB 14.6 06/22/2020 1058   HCT 42.9 12/18/2020 1023   HCT 41.8 06/22/2020 1058   PLT 234 12/18/2020 1023   PLT 301 06/22/2020 1058   MCV 91.9 12/18/2020 1023   MCV 96 06/22/2020 1058   MCH 32.3 12/18/2020 1023   MCHC 35.2 12/18/2020 1023   RDW 12.1 12/18/2020 1023   RDW 13.1 06/22/2020 1058   LYMPHSABS 1.4 08/13/2020 0226   LYMPHSABS 1.2 06/22/2020 1058   MONOABS 1.0 08/13/2020 0226   EOSABS 0.1 08/13/2020 0226   EOSABS 0.1 06/22/2020 1058   BASOSABS 0.0 08/13/2020 0226   BASOSABS 0.0 06/22/2020 1058    BMET    Component Value Date/Time   NA 137 12/18/2020 1023   NA 134 06/22/2020 1058   K 4.3 12/18/2020 1023   CL 101 12/18/2020 1023   CO2 25 12/18/2020 1023   GLUCOSE 243 (H) 12/18/2020 1023   BUN 16 12/18/2020 1023   BUN 8 06/22/2020 1058   CREATININE 0.96 12/18/2020 1023   CALCIUM 9.3 12/18/2020 1023   GFRNONAA >60 12/18/2020 1023   GFRAA 107 06/22/2020 1058    BNP    Component Value Date/Time  BNP 222.7 (H) 08/10/2020 1842    ProBNP No results found for:  PROBNP  Imaging: MR LUMBAR SPINE WO CONTRAST  Result Date: 04/12/2021 CLINICAL DATA:  60 year old male with low back pain radiating to the left leg with numbness since February. No known injury. EXAM: MRI LUMBAR SPINE WITHOUT CONTRAST TECHNIQUE: Multiplanar, multisequence MR imaging of the lumbar spine was performed. No intravenous contrast was administered. COMPARISON:  Lumbar radiographs 05/08/2019. CT Abdomen and Pelvis 08/11/2020. FINDINGS: Segmentation:  Normal on the comparisons. Alignment: Mild straightening of lumbar lordosis since 2020. Mild degenerative retrolisthesis of L5 on S1. Vertebrae: Chronic degenerative endplate marrow signal changes at L5-S1 including chronic inferior L5 Schmorl's node. Some vacuum phenomena was associated on the CT earlier this year. No marrow edema or evidence of acute osseous abnormality. Normal background bone marrow signal. Intact visible sacrum and SI joints. Conus medullaris and cauda equina: Conus extends to the L1 level. No lower spinal cord or conus signal abnormality. Normal cauda equina nerve roots. Paraspinal and other soft tissues: Visualized abdominal viscera and paraspinal soft tissues are within normal limits. Disc levels: T11-T12: Partially visible, grossly negative. T12-L1:  Negative. L1-L2: Subtle disc desiccation and circumferential disc bulge. No stenosis. L2-L3: Mild disc desiccation. Subtle circumferential disc bulge. No stenosis. L3-L4: Mild far lateral disc bulging. Mild facet and ligament flavum hypertrophy. Mild epidural lipomatosis. No spinal stenosis. Borderline to mild bilateral L3 neural foraminal stenosis. L4-L5: Mild circumferential disc bulge. Mild endplate spurring. Mild to moderate facet and ligament flavum hypertrophy. Mild epidural lipomatosis. No spinal or convincing lateral recess stenosis. Mild to moderate L4 neural foraminal stenosis appears symmetric. L5-S1: Chronic disc space loss and vacuum disc. Circumferential disc osteophyte  complex. Moderate bilateral facet hypertrophy. Mild epidural lipomatosis. Small central component of disc (series 8, image 34) but no spinal or lateral recess stenosis. There is moderate to severe bilateral L5 foraminal stenosis, although fairly symmetric. IMPRESSION: 1. 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 Superimposed moderate posterior element hypertrophy. Subsequent moderate to severe bilateral L5 neural foraminal stenosis, but fairly symmetric. Query left L5 radiculitis. 2. Generally mild for age lumbar spine degeneration elsewhere with no lumbar spinal stenosis. But up to moderate bilateral foraminal stenosis also at L4-L5 related to disc and posterior element degeneration. Query left L4 radiculitis. Electronically Signed   By: Genevie Ann M.D.   On: 04/12/2021 06:14   VAS [place] LOWER EXTREMITY VENOUS (DVT)  Result Date: 04/27/2021  Lower Venous DVT Study Patient Name:  Hadi Dubin.  Date of Exam:   04/17/2021 Medical Rec #: [id number]             Accession #:    [id number] Date of Birth: 11/08/1960             Patient Gender: M Patient Age:   36 years Exam Location:  [facility] Procedure:      VAS [place] LOWER EXTREMITY VENOUS (DVT) Referring Phys: Belenda Cruise SCHNIER --------------------------------------------------------------------------------  Risk Factors: DVT Hx Soleal vein DVT. Comparison Study: 08/2020 Performing Technologist: Concha Norway RVT  Examination Guidelines: A complete evaluation includes B-mode imaging, spectral Doppler, color Doppler, and power Doppler as needed of all accessible portions of each vessel. Bilateral testing is considered an integral part of a complete examination. Limited examinations for reoccurring indications may be performed as noted. The reflux portion of the exam is performed with the patient in reverse Trendelenburg.   +-----+---------------+---------+-----------+----------+--------------+ RIGHTCompressibilityPhasicitySpontaneityPropertiesThrombus  Aging +-----+---------------+---------+-----------+----------+--------------+ CFV  Full           Yes      Yes                                 +-----+---------------+---------+-----------+----------+--------------+ SFJ  Full           Yes      Yes                                 +-----+---------------+---------+-----------+----------+--------------+   +---------+---------------+---------+-----------+----------+--------------+ LEFT     CompressibilityPhasicitySpontaneityPropertiesThrombus Aging +---------+---------------+---------+-----------+----------+--------------+ CFV      Full           Yes      Yes                                 +---------+---------------+---------+-----------+----------+--------------+ SFJ      Full           Yes      Yes                                 +---------+---------------+---------+-----------+----------+--------------+ FV Prox  Full                                                        +---------+---------------+---------+-----------+----------+--------------+ FV Mid   Full           Yes      Yes                                 +---------+---------------+---------+-----------+----------+--------------+ FV DistalFull                                                        +---------+---------------+---------+-----------+----------+--------------+ PFV      Full           Yes      Yes                                 +---------+---------------+---------+-----------+----------+--------------+ POP      Full           Yes      Yes                                 +---------+---------------+---------+-----------+----------+--------------+ PTV      Full           Yes      Yes                                 +---------+---------------+---------+-----------+----------+--------------+  PERO     Full           Yes  Yes                                 +---------+---------------+---------+-----------+----------+--------------+ Soleal   Full                                                        +---------+---------------+---------+-----------+----------+--------------+ Gastroc  Full           Yes      Yes                                 +---------+---------------+---------+-----------+----------+--------------+ GSV      Full           Yes      Yes                                 +---------+---------------+---------+-----------+----------+--------------+ SSV      Full           Yes      Yes                                 +---------+---------------+---------+-----------+----------+--------------+     Summary: RIGHT: - No evidence of common femoral vein obstruction.  LEFT: - No evidence of deep vein thrombosis in the lower extremity. No indirect evidence of obstruction proximal to the inguinal ligament.  *See table(s) above for measurements and observations. Electronically signed by Hortencia Pilar MD on 04/27/2021 at 3:29:47 PM.    Final      Assessment & Plan:   COPD with acute exacerbation [facility]) - Patient was seen today for regular follow-up. He developed URI symptoms 2-3 weeks ago. Received no treatment. He currently has congested cough with purulent mucus. Associated shortness of breath, chest tightness and wheezing. He is compliant with Stiolto but requiring Albuterol hfa upwards of 7-10 times a day. Sending in Rx for Doxycycline and prednisone taper for AECOPD. We will also trial patient on Trelegy 124mg one puff daily and start mucinex-DM OTC twice daily x 1 week or until cough is better. FU in 2-4 weeks or sooner if needed.    Respiratory failure with hypoxia (HCC) - Continue 3L oxygen at night and prn to maintain O2 >88-90%   EMartyn Ehrich NP 05/10/2021

## 2021-04-19 NOTE — Patient Instructions (Addendum)
  Recommendations: - Stop Stiolto - Start Trelegy - take one puff daily in the morning (given 2 week sample, call if you want RX sent) - Use albuterol rescue inhaler 2 puffs every 6 hours as needed for breakthrough shortness of breath/wheezing  - Start Mucinex-DM t(over the counter) twice daily for 1 week until cough is better  Rx: - Doxycycline 1 tab twice daily x 10 days - Prednisone taper  Follow-up: - 2-4 weeks with Beth NP or sooner if need

## 2021-05-01 NOTE — Progress Notes (Signed)
Cardiology Office Note    Date:  05/02/2021   ID:  Timothy Main., DOB 1961/06/15, MRN [id number]  PCP:  Ellene Route  Cardiologist:  Kate Sable, MD  Electrophysiologist:  None   Chief Complaint: Follow up  History of Present Illness:   Timothy Reister. is a 60 y.o. male with history of bacterial empyema in 07/2020, COPD with ongoing tobacco use of 40+ years, DM2, HTN, HLD, PAD, lumbar radiculopathy, idiopathic peripheral neuropathy, depression, and GERD who presents for follow-up of dyspnea.   He has been followed by pulmonology for ongoing shortness of breath with underlying COPD and ongoing tobacco use.  Echo obtained by them in 05/2020 showed an EF of 60 to 65%, no regional wall motion abnormalities, normal LV diastolic function parameters, normal RV systolic function and ventricular cavity size, and a mildly dilated left atrium.  PFTs demonstrated moderate to severe COPD.  Prior lung cancer screening CT demonstrated coronary artery calcifications and in this setting he was referred to our office and evaluated by Dr. Garen Lah on 08/08/2020 with a 71-monthhistory of progressive shortness of breath sometimes associated with chest discomfort.  It was noted [facility] though never started this medication.  BP at his visit with our office was soft at 98/64 with an O2 saturation of 87% on room air.  It was recommended Entresto be discontinued in the setting of soft BP and preserved LV systolic function with normal diastolic function noted on echo as above.  [facility] though this was delayed as he was admitted to the hospital for sepsis with acute hypoxic respiratory failure secondary to Streptococcus intermedius empyema status post VATS with left-sided decortication/chest tube placement.  Hospital admission was complicated by left soleal DVT with recommendation to hold long-term Plavix  and treat with Eliquis.  During this admission high-sensitivity troponin was normal x2.  BNP 222.  He underwent Lexiscan MPI on 10/25/2020 which showed no evidence of ischemia with an EF of 55 to 65% and was overall a low risk study.  He was seen in follow-up in 10/2020 and continued to note exertional shortness of breath and chest discomfort that was improved when compared to his initial visit in 07/2020.  He was smoking less than half pack per day.  He is followed by vascular surgery for underlying PAD with ABIs being abnormal bilaterally in 01/2021, and were essentially unchanged when compared to prior study in 07/2020.  At his last visit with vascular surgery in 03/2021, he noted progressive claudication symptoms with recommendation to undergo repeat noninvasive imaging.  He comes in doing reasonably well from a cardiac perspective.  No chest pain.  He does feel like his dyspnea has gotten worse, noting shortness of breath following ambulation of approximately 100 feet that improves with rest.  He also notes intermittent palpitations that will occur 3-4 times per month and will last for several seconds to several minutes followed by spontaneous resolution.  With these palpitations, there is some associated dizziness without presyncope or syncope.  He drinks a minimal amount of tea and will occasionally have a beer socially.  No illicit substances.  No lower extremity swelling.  He does now sleep with 4 pillows, prior baseline 2 pillows).  His weight is down 6 pounds today when compared to his last clinic weight.  He does remain on apixaban secondary to lower extremity DVT and denies any falls, hematochezia, or  melena.  Anticoagulation is followed by his PCP.     Labs independently reviewed: 12/2020 - Hgb 15.1, PLT 234, potassium 4.3, BUN 16, serum creatinine 0.96, albumin 4.3, AST/ALT normal 09/2020 - A1c 8.3 06/2020 - A1c 7.7, TC 167, TG 335, HDL 44, LDL 70 10/2019 -TSH normal    Past Medical History:   Diagnosis Date   Diabetes mellitus without complication ([place])    Hypercholesteremia    Hypertension     Past Surgical History:  Procedure Laterality Date   AMPUTATION Left 03/22/2020   Procedure: AMPUTATION RAY-1st Ray Left Foot;  Surgeon: [name], [name];  Location: [facility] ORS;  Service: Podiatry;  Laterality: Left;   AMPUTATION TOE Left 03/16/2020   Procedure: AMPUTATION GREAT TOE;  Surgeon: [name], [name];  Location: [facility] ORS;  Service: Podiatry;  Laterality: Left;   COLONOSCOPY WITH PROPOFOL N/A 12/22/2019   Procedure: COLONOSCOPY WITH PROPOFOL;  Surgeon: [name], MD;  Location: [facility] ENDOSCOPY;  Service: Endoscopy;  Laterality: N/A;   LOWER EXTREMITY ANGIOGRAPHY Left 03/18/2020   Procedure: Lower Extremity Angiography;  Surgeon: [name], MD;  Location: [facility];  Service: Cardiovascular;  Laterality: Left;   TONSILLECTOMY     VIDEO ASSISTED THORACOSCOPY (VATS)/DECORTICATION Left 08/15/2020   Procedure: VIDEO ASSISTED THORACOSCOPY (VATS)/DECORTICATION;  Surgeon: [name], MD;  Location: [facility] OR;  Service: Thoracic;  Laterality: Left;   WISDOM TOOTH EXTRACTION      Current Medications: Current Meds  Medication Sig   acetaminophen (TYLENOL) 650 MG CR tablet Take 1,300 mg by mouth every 8 (eight) hours as needed for pain.   albuterol (VENTOLIN HFA) 108 (90 Base) MCG/ACT inhaler INHALE 1 PUFF INTO THE LUNGS ONCE EVERY 6 HOURS AS NEEDED FOR SHORTNESS OF BREATH OR WHEEZING.   amLODipine (NORVASC) 5 MG tablet Take 1 tablet (5 mg total) by mouth once daily.   apixaban (ELIQUIS) 5 MG TABS tablet TAKE ONE TABLET (5MG TOTAL) BY MOUTH TWICE DAILY.   ARIPiprazole (ABILIFY) 10 MG tablet TAKE ONE TABLET (10MG TOTAL) BY MOUTH ONCE DAILY AT BEDTIME.   blood glucose meter kit and supplies KIT Dispense based on patient and insurance preference. Use up to four times daily as directed. (FOR ICD-9 250.00, 250.01).   cloNIDine (CATAPRES) 0.1 MG tablet Take one tablet (  0.19m total) by mouth every day at bedtime.   doxycycline (VIBRA-TABS) 100 MG tablet Take 1 tablet (100 mg total) by mouth 2 (two) times daily.   Fluticasone-Umeclidin-Vilant (TRELEGY ELLIPTA) 100-62.5-25 MCG/ACT AEPB Inhale 100 mcg into the lungs daily.   gabapentin (NEURONTIN) 400 MG capsule TAKE ONE CAPSULE (400MG TOTAL) BY MOUTH THREE TIMES DAILY. (Patient taking differently: Take 400 mg by mouth 4 (four) times daily.)   glipiZIDE (GLUCOTROL) 10 MG tablet TAKE ONE TABLET (10MG TOTAL) BY MOUTH TWICE DAILY.   guaiFENesin (MUCINEX) 600 MG 12 hr tablet Take 1 tablet (600 mg total) by mouth 2 (two) times daily as needed for to loosen phlegm or cough.   magnesium oxide (MAG-OX) 400 MG tablet Take 800 mg by mouth 2 (two) times daily.   melatonin 3 MG TABS tablet TAKE 2 TABLETS (6MG TOTAL) BY MOUTH ONCE DAILY AT BEDTIME.   metFORMIN (GLUCOPHAGE) 1000 MG tablet TAKE ONE TABLET (1,000MG TOTAL) BY MOUTH TWICE DAILY.   pantoprazole (PROTONIX) 40 MG tablet TAKE ONE TABLET (40MG TOTAL) BY MOUTH ONCE DAILY.   predniSONE (DELTASONE) 10 MG tablet Take 4 tabs po daily x 3 days; then 3 tabs daily x3 days; then  2 tabs daily x3 days; then 1 tab daily x 3 days; then stop   rosuvastatin (CRESTOR) 5 MG tablet TAKE ONE TABLET (5MG TOTAL) BY MOUTH ONCE DAILY.   Tiotropium Bromide-Olodaterol (STIOLTO RESPIMAT) 2.5-2.5 MCG/ACT AERS Inhale 2 puffs into the lungs once daily.   venlafaxine XR (EFFEXOR XR) 37.5 MG 24 hr capsule TAKE 2 CAPSULES (75MG TOTAL) BY MOUTH ONCE DAILY.    Allergies:   Bee venom, Cheese, Coffea arabica, and Coffee flavor   Social History   Socioeconomic History   Marital status: Single    Spouse name: Not on file   Number of children: 0   Years of education: Not on file   Highest education level: Not on file  Occupational History   Occupation: Unemployed  Tobacco Use   Smoking status: Every Day    Packs/day: 2.00    Years: 41.00    Pack years: 82.00    Types: Cigarettes   Smokeless  tobacco: Never   Tobacco comments:    10 ciggs or less 04/19/2021  Vaping Use   Vaping Use: Never used  Substance and Sexual Activity   Alcohol use: Yes    Alcohol/week: 6.0 standard drinks    Types: 6 Cans of beer per week    Comment: occasionally    Drug use: Not Currently   Sexual activity: Not Currently  Other Topics Concern   Not on file  Social History Narrative   Not on file   Social Determinants of Health   Financial Resource Strain: Not on file  Food Insecurity: Not on file  Transportation Needs: Not on file  Physical Activity: Not on file  Stress: Not on file  Social Connections: Not on file     Family History:  The patient's Family history is unknown by patient.  ROS:   Review of Systems  Constitutional:  Positive for malaise/fatigue. Negative for chills, diaphoresis, fever and weight loss.  HENT:  Negative for congestion.   Eyes:  Negative for discharge and redness.  Respiratory:  Positive for cough and shortness of breath. Negative for hemoptysis, sputum production and wheezing.   Cardiovascular:  Positive for orthopnea. Negative for chest pain, palpitations, claudication, leg swelling and PND.  Gastrointestinal:  Negative for abdominal pain, blood in stool, heartburn, melena, nausea and vomiting.  Musculoskeletal:  Negative for falls and myalgias.  Skin:  Negative for rash.  Neurological:  Negative for dizziness, tingling, tremors, sensory change, speech change, focal weakness, loss of consciousness and weakness.  Endo/Heme/Allergies:  Does not bruise/bleed easily.  Psychiatric/Behavioral:  Negative for substance abuse. The patient is not nervous/anxious.   All other systems reviewed and are negative.   EKGs/Labs/Other Studies Reviewed:    Studies reviewed were summarized above. The additional studies were reviewed today:  2D echo 05/2020:  1. Left ventricular ejection fraction, by estimation, is 60 to 65%. The  left ventricle has normal function. The  left ventricle has no regional  wall motion abnormalities. Left ventricular diastolic parameters were  normal.   2. Right ventricular systolic function is normal. The right ventricular  size is normal. Tricuspid regurgitation signal is inadequate for assessing  PA pressure.   3. Left atrial size was mildly dilated.  __________   Carlton Adam MPI 10/25/2020: There was no ST segment deviation noted during stress. The study is normal. This is a low risk study. The left ventricular ejection fraction is normal (55-65%). There is no evidence for ischemia   EKG:  EKG is ordered today.  The  EKG ordered today demonstrates NSR, 83 bpm, normal axis, no acute ST-T changes Recent Labs: 08/10/2020: B Natriuretic Peptide 222.7 12/18/2020: ALT 25; BUN 16; Creatinine, Ser 0.96; Hemoglobin 15.1; Platelets 234; Potassium 4.3; Sodium 137  Recent Lipid Panel    Component Value Date/Time   CHOL 167 06/22/2020 1058   TRIG 335 (H) 06/22/2020 1058   HDL 44 06/22/2020 1058   CHOLHDL 3.8 06/22/2020 1058   LDLCALC 70 06/22/2020 1058    PHYSICAL EXAM:    VS:  BP 116/80   Pulse 83   Ht _0  (1.778 m)   Wt 190 lb (86.2 kg)   SpO2 98%   BMI 27.26 kg/m   BMI: Body mass index is 27.26 kg/m.  Physical Exam Constitutional:      Appearance: He is well-developed.  HENT:     Head: Normocephalic and atraumatic.  Eyes:     General:        Right eye: No discharge.        Left eye: No discharge.  Neck:     Vascular: No JVD.  Cardiovascular:     Rate and Rhythm: Normal rate and regular rhythm.     Pulses:          Radial pulses are 2+ on the right side.       Posterior tibial pulses are 1+ on the right side and 1+ on the left side.     Heart sounds: Normal heart sounds, S1 normal and S2 normal. Heart sounds not distant. No midsystolic click and no opening snap. No murmur heard.   No friction rub.  Pulmonary:     Effort: Pulmonary effort is normal. No respiratory distress.     Breath sounds: Decreased  breath sounds present. No wheezing or rales.     Comments: Diminished breath sounds bilaterally with poor inspiratory effort.  Supplemental oxygen is noted to be in the room with the patient, though not currently being utilized with O2 saturations of 98% on room air. Chest:     Chest wall: No tenderness.  Abdominal:     General: There is no distension.     Palpations: Abdomen is soft.     Tenderness: There is no abdominal tenderness.  Musculoskeletal:     Cervical back: Normal range of motion.     Right lower leg: No edema.     Left lower leg: No edema.  Skin:    General: Skin is warm and dry.     Nails: There is no clubbing.  Neurological:     Mental Status: He is alert and oriented to person, place, and time.  Psychiatric:        Speech: Speech normal.        Behavior: Behavior normal.        Thought Content: Thought content normal.        Judgment: Judgment normal.    Wt Readings from Last 3 Encounters:  05/02/21 190 lb (86.2 kg)  04/19/21 191 lb 6.4 oz (86.8 kg)  04/17/21 196 lb (88.9 kg)     ASSESSMENT & PLAN:   Coronary artery calcification/HLD: No symptoms of chest pain.  Recent Lexiscan MPI showed no evidence of ischemia.  Suspect his progressive dyspnea is pulmonary in etiology.  We will trend an echo to ensure no new cardiomyopathy and to further evaluate PASP.  At this time, unless dictated by echo, no plans for further ischemic evaluation and recommend resector modification including complete smoking cessation.  Palpitations: Place [employer]  patch.  Recent labs showed normal thyroid function and no significant electrolyte derangements.  Continue to minimize caffeine intake.  HTN: Blood pressure is well controlled in the office today.  He remains on amlodipine and clonidine.  Chronic hypoxic respiratory failure with history of empyema status post VATS with decortication/COPD/ongoing tobacco use: Stable without acute exacerbation.  Suspect his progressive dyspnea is  pulmonary in etiology.  Recommend he follow-up with pulmonology.  History of DVT: No symptoms concerning for bleeding.  He remains on apixaban which is followed by his PCP.  PVD: Followed by vascular surgery.   Disposition: F/u with Dr. Garen Lah or an APP in 2 months.   Medication Adjustments/Labs and Tests Ordered: Current medicines are reviewed at length with the patient today.  Concerns regarding medicines are outlined above. Medication changes, Labs and Tests ordered today are summarized above and listed in the Patient Instructions accessible in Encounters.   Signed, Christell Faith, PA-C 05/02/2021 2:27 PM     [facility] [address], [place] [address] [phone]

## 2021-05-02 ENCOUNTER — Ambulatory Visit (INDEPENDENT_AMBULATORY_CARE_PROVIDER_SITE_OTHER): Payer: Medicaid Other | Admitting: Physician Assistant

## 2021-05-02 ENCOUNTER — Ambulatory Visit (INDEPENDENT_AMBULATORY_CARE_PROVIDER_SITE_OTHER): Payer: Medicaid Other

## 2021-05-02 ENCOUNTER — Other Ambulatory Visit: Payer: Self-pay

## 2021-05-02 ENCOUNTER — Encounter: Payer: Self-pay | Admitting: Physician Assistant

## 2021-05-02 VITALS — BP 116/80 | HR 83 | Ht 70.0 in | Wt 190.0 lb

## 2021-05-02 DIAGNOSIS — E785 Hyperlipidemia, unspecified: Secondary | ICD-10-CM | POA: Diagnosis not present

## 2021-05-02 DIAGNOSIS — I10 Essential (primary) hypertension: Secondary | ICD-10-CM | POA: Diagnosis not present

## 2021-05-02 DIAGNOSIS — J449 Chronic obstructive pulmonary disease, unspecified: Secondary | ICD-10-CM

## 2021-05-02 DIAGNOSIS — R002 Palpitations: Secondary | ICD-10-CM | POA: Diagnosis not present

## 2021-05-02 DIAGNOSIS — I739 Peripheral vascular disease, unspecified: Secondary | ICD-10-CM

## 2021-05-02 DIAGNOSIS — I2510 Atherosclerotic heart disease of native coronary artery without angina pectoris: Secondary | ICD-10-CM | POA: Diagnosis not present

## 2021-05-02 DIAGNOSIS — R0600 Dyspnea, unspecified: Secondary | ICD-10-CM

## 2021-05-02 DIAGNOSIS — Z8709 Personal history of other diseases of the respiratory system: Secondary | ICD-10-CM

## 2021-05-02 DIAGNOSIS — J9611 Chronic respiratory failure with hypoxia: Secondary | ICD-10-CM

## 2021-05-02 DIAGNOSIS — Z86718 Personal history of other venous thrombosis and embolism: Secondary | ICD-10-CM

## 2021-05-02 DIAGNOSIS — I2584 Coronary atherosclerosis due to calcified coronary lesion: Secondary | ICD-10-CM

## 2021-05-02 NOTE — Patient Instructions (Signed)
Medication Instructions:  No changes at this time.  *If you need a refill on your cardiac medications before your next appointment, please call your pharmacy*   Lab Work: None  If you have labs (blood work) drawn today and your tests are completely normal, you will receive your results only by: MyChart Message (if you have MyChart) OR A paper copy in the mail If you have any lab test that is abnormal or we need to change your treatment, we will call you to review the results.   Testing/Procedures: Your physician has requested that you have an echocardiogram.   Echocardiography is a painless test that uses sound waves to create images of your heart. It provides your doctor with information about the size and shape of your heart and how well your heart's chambers and valves are working. This procedure takes approximately one hour. There are no restrictions for this procedure.    Your physician has recommended that you wear a Zio monitor for 2 weeks. They will mail you the monitor with instructions on placement. Once you have worn for the 2 weeks you will return in box provided.   This monitor is a medical device that records the heart's electrical activity. Doctors most often use these monitors to diagnose arrhythmias. Arrhythmias are problems with the speed or rhythm of the heartbeat. The monitor is a small device applied to your chest. You can wear one while you do your normal daily activities. While wearing this monitor if you have any symptoms to push the button and record what you felt. Once you have worn this monitor for the period of time provider prescribed (Usually 14 days), you will return the monitor device in the postage paid box. Once it is returned they will download the data collected and provide [place] with a report which the provider will then review and we will call you with those results. Important tips:  Avoid showering during the first 24 hours of wearing the monitor. Avoid  excessive sweating to help maximize wear time. Do not submerge the device, no hot tubs, and no swimming pools. Keep any lotions or oils away from the patch. After 24 hours you may shower with the patch on. Take brief showers with your back facing the shower head.  Do not remove patch once it has been placed because that will interrupt data and decrease adhesive wear time. Push the button when you have any symptoms and write down what you were feeling. Once you have completed wearing your monitor, remove and place into box which has postage paid and place in your outgoing mailbox.  If for some reason you have misplaced your box then call our office and we can provide another box and/or mail it off for you.      Follow-Up: At [facility], you and your health needs are our priority.  As part of our continuing mission to provide you with exceptional heart care, we have created designated Provider Care Teams.  These Care Teams include your primary Cardiologist (physician) and Advanced Practice Providers (APPs -  Physician Assistants and Nurse Practitioners) who all work together to provide you with the care you need, when you need it.  We recommend signing up for the patient portal called "MyChart".  Sign up information is provided on this After Visit Summary.  MyChart is used to connect with patients for Virtual Visits (Telemedicine).  Patients are able to view lab/test results, encounter notes, upcoming appointments, etc.  Non-urgent messages can be  sent to your provider as well.   To learn more about what you can do with MyChart, go to [email or web address].    Your next appointment:   2 month(s)  The format for your next appointment:   In Person  Provider:   Debbe Odea, MD or Eula Listen, PA-C

## 2021-05-09 ENCOUNTER — Telehealth (INDEPENDENT_AMBULATORY_CARE_PROVIDER_SITE_OTHER): Payer: Self-pay

## 2021-05-09 NOTE — Telephone Encounter (Signed)
He needs to go to the outpatient imaging to get his xray.  He doesn't need an order, he just has to go but he needs to go by 12/1.  He then needs an ABI and see GS

## 2021-05-09 NOTE — Telephone Encounter (Signed)
Patient came into the office needing to request the provider to order new orders for [place] of toes he was never able to get them done because he lost his phone and is just now gaining access back to all his information     Please call and advise

## 2021-05-10 NOTE — Assessment & Plan Note (Signed)
-   Continue 3L oxygen at night and prn to maintain O2 >88-90%

## 2021-05-10 NOTE — Assessment & Plan Note (Signed)
-   Patient was seen today for regular follow-up. He developed URI symptoms 2-3 weeks ago. Received no treatment. He currently has congested cough with purulent mucus. Associated shortness of breath, chest tightness and wheezing. He is compliant with Stiolto but requiring Albuterol hfa upwards of 7-10 times a day. Sending in Rx for Doxycycline and prednisone taper for AECOPD. We will also trial patient on Trelegy one puff daily and start mucinex-DM OTC twice daily x 1 week or until cough is better. FU in 2-4 weeks or sooner if needed.

## 2021-05-16 ENCOUNTER — Ambulatory Visit
Admission: RE | Admit: 2021-05-16 | Discharge: 2021-05-16 | Disposition: A | Discharge status: Home / Self Care | Payer: Medicaid Other | Attending: Vascular Surgery | Admitting: Vascular Surgery

## 2021-05-16 ENCOUNTER — Other Ambulatory Visit: Payer: Self-pay

## 2021-05-16 ENCOUNTER — Ambulatory Visit
Admission: RE | Admit: 2021-05-16 | Discharge: 2021-05-16 | Disposition: A | Discharge status: Home / Self Care | Payer: Medicaid Other | Source: Ambulatory Visit | Attending: Vascular Surgery | Admitting: Vascular Surgery

## 2021-05-16 DIAGNOSIS — M869 Osteomyelitis, unspecified: Secondary | ICD-10-CM

## 2021-05-16 NOTE — Telephone Encounter (Signed)
Spoke with the patient and gave the recommendation from Sheppard Plumber NP. See notes below.

## 2021-05-24 ENCOUNTER — Encounter: Payer: Self-pay | Admitting: Pulmonary Disease

## 2021-05-24 ENCOUNTER — Ambulatory Visit (INDEPENDENT_AMBULATORY_CARE_PROVIDER_SITE_OTHER): Payer: Medicaid Other | Admitting: Pulmonary Disease

## 2021-05-24 ENCOUNTER — Other Ambulatory Visit: Payer: Self-pay

## 2021-05-24 VITALS — BP 110/70 | HR 88 | Temp 97.7°F | Ht 70.0 in | Wt 185.8 lb

## 2021-05-24 DIAGNOSIS — R002 Palpitations: Secondary | ICD-10-CM | POA: Diagnosis not present

## 2021-05-24 DIAGNOSIS — Z8709 Personal history of other diseases of the respiratory system: Secondary | ICD-10-CM

## 2021-05-24 DIAGNOSIS — F17210 Nicotine dependence, cigarettes, uncomplicated: Secondary | ICD-10-CM | POA: Diagnosis not present

## 2021-05-24 DIAGNOSIS — J9611 Chronic respiratory failure with hypoxia: Secondary | ICD-10-CM | POA: Diagnosis not present

## 2021-05-24 DIAGNOSIS — J449 Chronic obstructive pulmonary disease, unspecified: Secondary | ICD-10-CM | POA: Diagnosis not present

## 2021-05-24 NOTE — Patient Instructions (Addendum)
We are getting a breathing test ordered.  Continue using your Trelegy, make sure you rinse your mouth well after you use it.  We will see him in follow-up in 6 to 8 weeks time call sooner should any new problems arise.  Practitioner at that time depending on our schedule.

## 2021-05-24 NOTE — Progress Notes (Signed)
Subjective:    Patient ID: Timothy [name]., male    DOB: 1960/07/15, 60 y.o.   MRN: [id number] Chief Complaint  Patient presents with   Follow-up    Sob with exertion, prod cough with greenish to brown sputum and wheezing   HPI Timothy [name] is a 60 year old current smoker (3 to 7 cigarettes/day 63 PY) who presents for follow-up of COPD and dyspnea.  This is a scheduled appointment.  He was last evaluated by Timothy Barrow, NP on 19 April 2021.  He was following up at that time after an episode of empyema with Streptococcus imitis which required VATS.  He was treated at [name].  He has not had any recurrent symptoms of empyema.  He does have some persistent pain postoperatively but is getting better.  Also notes persistent mild dyspnea but at baseline.  He is now back to using Trelegy and feels that this is doing well for him.  Previously he thought the Trelegy was responsible for his empyema however he had started the medication only a few days prior to his issues developing.  He is on oxygen at 3 L/min since he was in the hospital for his empyema.  He notes that this helps him.  Any fevers, chills or sweats.  He has had usual morning cough sputum mostly whitish to gray.  Occasional wheezing noted that responds to albuterol.  He does not endorse any other symptomatology today overall feels that he is doing as good as can be expected.  Today he actually looks quite well and does not appear disheveled.  DATA: 06/16/2020 PFT: FEV1 1.54 L or 42% predicted, FVC 2.54 L or 52% predicted, FEV1/FVC 61%, there is bronchodilator response with net change of 14% on FEV1 postbronchodilator.  There is concomitant restrictive physiology.  There is fluttering of the flow-volume loop consistent with possible OSA.  Diffusion capacity moderately reduced. 06/17/2019 one 2D echo: LVEF 60 to 65%, mildly dilated LA, no diastolic dysfunction. 08/21/2020 chest CT: Residual left empyema/pleural rind. 10/25/2020 nuclear  stress test: Normal, no evidence of ischemia  Review of Systems A 10 point review of systems was performed and it is as noted above otherwise negative.  Patient Active Problem List   Diagnosis Date Noted   Atherosclerosis of native arteries of the extremities with ulceration ([place]) 04/18/2021   DJD (degenerative joint disease), lumbosacral 04/18/2021   Leg pain 01/23/2021   Acute deep vein thrombosis (DVT) of left lower extremity ([place]) 01/23/2021   Atherosclerosis of native arteries of extremity with intermittent claudication ([place]) 01/22/2021   MDD (major depressive disorder), recurrent severe, without psychosis ([place]) 12/19/2020   Severe episode of recurrent major depressive disorder, with psychotic features ([place])    COPD with acute exacerbation ([place]) 12/14/2020   Hx of sleep apnea 12/14/2020   Empyema lung ([place]) 08/15/2020   Type 2 diabetes mellitus with hyperglycemia ([place]) 08/11/2020   Respiratory failure with hypoxia ([place]) 08/11/2020   Hyponatremia 08/11/2020   Chronic diastolic CHF (congestive heart failure) ([place]) 08/11/2020   Anemia 08/11/2020   Abdominal pain 08/11/2020   Empyema ([place]) 08/10/2020   Osteomyelitis ([place]) 03/15/2020   Open wound of foot 01/20/2020   Elevated lipids 01/20/2020   Personal history of colonic polyps    Shortness of breath 11/04/2019   Encounter to establish care 10/15/2019   Essential hypertension 10/15/2019   Controlled diabetes mellitus type 2 with complications ([place]) 15/17/6160   Smoking 10/15/2019   Peripheral neuropathy 10/15/2019   Cramp, abdominal 10/15/2019  History of hepatitis 10/15/2019   History of gastroesophageal reflux (GERD) 10/15/2019   Type 2 diabetes mellitus with diabetic neuropathy, unspecified ([place]) 10/15/2019   Post-traumatic stress disorder, unspecified 07/14/2019   Antisocial personality disorder ([place]) 07/14/2019   Social History   Tobacco Use   Smoking status: Every Day    Packs/day: 2.00    Years: 41.00    Pack  years: 82.00    Types: Cigarettes   Smokeless tobacco: Never   Tobacco comments:    3-7cigs daily 05/24/2021  Substance Use Topics   Alcohol use: Yes    Alcohol/week: 6.0 standard drinks    Types: 6 Cans of beer per week    Comment: occasionally    Allergies  Allergen Reactions   Bee Venom Anaphylaxis   Cheese Anaphylaxis, Swelling and Other (See Comments)    Blue Cheese only   Coffea Arabica Shortness Of Breath   Coffee Flavor Shortness Of Breath   Current Meds  Medication Sig   acetaminophen (TYLENOL) 650 MG CR tablet Take 1,300 mg by mouth every 8 (eight) hours as needed for pain.   albuterol (VENTOLIN HFA) 108 (90 Base) MCG/ACT inhaler INHALE 1 PUFF INTO THE LUNGS ONCE EVERY 6 HOURS AS NEEDED FOR SHORTNESS OF BREATH OR WHEEZING.   amLODipine (NORVASC) 5 MG tablet Take 1 tablet (5 mg total) by mouth once daily.   apixaban (ELIQUIS) 5 MG TABS tablet TAKE ONE TABLET (5MG TOTAL) BY MOUTH TWICE DAILY.   ARIPiprazole (ABILIFY) 10 MG tablet TAKE ONE TABLET (10MG TOTAL) BY MOUTH ONCE DAILY AT BEDTIME.   blood glucose meter kit and supplies KIT Dispense based on patient and insurance preference. Use up to four times daily as directed. (FOR ICD-9 250.00, 250.01).   cloNIDine (CATAPRES) 0.1 MG tablet Take one tablet ( 0.50m total) by mouth every day at bedtime.   Fluticasone-Umeclidin-Vilant (TRELEGY ELLIPTA) 100-62.5-25 MCG/ACT AEPB Inhale 100 mcg into the lungs daily.   gabapentin (NEURONTIN) 400 MG capsule TAKE ONE CAPSULE (400MG TOTAL) BY MOUTH THREE TIMES DAILY. (Patient taking differently: Take 400 mg by mouth 4 (four) times daily.)   glipiZIDE (GLUCOTROL) 10 MG tablet TAKE ONE TABLET (10MG TOTAL) BY MOUTH TWICE DAILY.   guaiFENesin (MUCINEX) 600 MG 12 hr tablet Take 1 tablet (600 mg total) by mouth 2 (two) times daily as needed for to loosen phlegm or cough.   magnesium oxide (MAG-OX) 400 MG tablet Take 800 mg by mouth 2 (two) times daily.   melatonin 3 MG TABS tablet TAKE 2 TABLETS  (6MG TOTAL) BY MOUTH ONCE DAILY AT BEDTIME.   metFORMIN (GLUCOPHAGE) 1000 MG tablet TAKE ONE TABLET (1,000MG TOTAL) BY MOUTH TWICE DAILY.   pantoprazole (PROTONIX) 40 MG tablet TAKE ONE TABLET (40MG TOTAL) BY MOUTH ONCE DAILY.   rosuvastatin (CRESTOR) 5 MG tablet TAKE ONE TABLET (5MG TOTAL) BY MOUTH ONCE DAILY.   venlafaxine XR (EFFEXOR XR) 37.5 MG 24 hr capsule TAKE 2 CAPSULES (75MG TOTAL) BY MOUTH ONCE DAILY.   [DISCONTINUED] doxycycline (VIBRA-TABS) 100 MG tablet Take 1 tablet (100 mg total) by mouth 2 (two) times daily.   [DISCONTINUED] predniSONE (DELTASONE) 10 MG tablet Take 4 tabs po daily x 3 days; then 3 tabs daily x3 days; then 2 tabs daily x3 days; then 1 tab daily x 3 days; then stop   [DISCONTINUED] Tiotropium Bromide-Olodaterol (STIOLTO RESPIMAT) 2.5-2.5 MCG/ACT AERS Inhale 2 puffs into the lungs once daily.       Objective:   Physical Exam BP 110/70 (BP Location: Left  Arm, Cuff Size: Normal)   Pulse 88   Temp 97.7 F (36.5 C) (Temporal)   Ht '5\' 10"'  (1.778 m)   Wt 185 lb 12.8 oz (84.3 kg)   SpO2 97%   BMI 26.66 kg/m  GENERAL: Well-developed well-nourished gentleman in no acute distress.  Fully ambulatory.  Conversational dyspnea.  Notable on nasal cannula O2. HEAD: Normocephalic, atraumatic.  EYES: Pupils equal, round, reactive to light.  No scleral icterus. Injected conjunctiva. MOUTH: Nose/mouth/throat not examined due to masking requirements for COVID 19. NECK: Supple. No thyromegaly. Trachea midline. No JVD.  No adenopathy. PULMONARY: Good air entry symmetrically bilaterally.Coarse breath sounds with no other adventitious sounds. CARDIOVASCULAR: S1 and S2. Regular rate and rhythm.  No rubs, murmurs or gallops heard. ABDOMEN: Benign. MUSCULOSKELETAL: No clubbing, no edema.   NEUROLOGIC: No focal deficit, speech is fluent, SKIN: Intact,warm,dry. PSYCH: Mood and behavior    Assessment & Plan:     ICD-10-CM   1. Stage 3 severe COPD by GOLD classification ([place])   J44.9 Pulmonary Function Test [facility] Only   We will obtain PFTs to reassess pulmonary function Continue Trelegy Ellipta 100/62.5/25 Continue albuterol as needed    2. Chronic respiratory failure with hypoxia (HCC)  J96.11    Continue oxygen at 3 L/min Will reassess on follow-up visit to see if can decrease    3. History of pleural empyema  Z87.09    This issue adds complexity to his management Has residual pleural rind Likely added restrictive physiology to his pulmonary function    4. Tobacco dependence due to cigarettes  F17.210    Counseled regards to discontinuation of smoking     Orders Placed This Encounter  Procedures   Pulmonary Function Test [facility] Only    Standing Status:   Future    Standing Expiration Date:   05/24/2022    Scheduling Instructions:     06/07/2021 IF POSSIBLE OTHERWISE NEXT AVAILABLE.    Order Specific Question:   Full PFT: includes the following: basic spirometry, spirometry pre & post bronchodilator, diffusion capacity (DLCO), lung volumes    Answer:   Full PFT   Overall Antuane appears to be doing well.  He is well compensated with regards to his COPD.  We will obtain pulmonary function testing to reassess his lung function in particular since developing pleural rind after empyema.  We will see him in follow-up in 6 to 8 weeks time he is to contact us prior to that time should any new difficulties arise.  Renold Don, MD Advanced Bronchoscopy PCCM [facility]    *This note was dictated using voice recognition software/Dragon.  Despite best efforts to proofread, errors can occur which can change the meaning. Any transcriptional errors that result from this process are unintentional and may not be fully corrected at the time of dictation.

## 2021-05-25 ENCOUNTER — Other Ambulatory Visit (INDEPENDENT_AMBULATORY_CARE_PROVIDER_SITE_OTHER): Payer: Self-pay | Admitting: Vascular Surgery

## 2021-05-25 ENCOUNTER — Ambulatory Visit (INDEPENDENT_AMBULATORY_CARE_PROVIDER_SITE_OTHER): Payer: Medicaid Other | Admitting: Vascular Surgery

## 2021-05-25 ENCOUNTER — Encounter (INDEPENDENT_AMBULATORY_CARE_PROVIDER_SITE_OTHER): Payer: Medicaid Other

## 2021-05-25 DIAGNOSIS — I7025 Atherosclerosis of native arteries of other extremities with ulceration: Secondary | ICD-10-CM

## 2021-05-26 ENCOUNTER — Encounter: Payer: Self-pay | Admitting: Pulmonary Disease

## 2021-05-29 ENCOUNTER — Other Ambulatory Visit: Payer: Self-pay

## 2021-05-29 ENCOUNTER — Other Ambulatory Visit
Admission: RE | Admit: 2021-05-29 | Discharge: 2021-05-29 | Disposition: A | Discharge status: Home / Self Care | Payer: Medicaid Other | Source: Ambulatory Visit | Attending: Pulmonary Disease | Admitting: Pulmonary Disease

## 2021-05-29 DIAGNOSIS — Z20822 Contact with and (suspected) exposure to covid-19: Secondary | ICD-10-CM | POA: Insufficient documentation

## 2021-05-29 DIAGNOSIS — Z01812 Encounter for preprocedural laboratory examination: Secondary | ICD-10-CM | POA: Insufficient documentation

## 2021-05-30 ENCOUNTER — Ambulatory Visit: Payer: Medicaid Other | Attending: Pulmonary Disease

## 2021-05-30 DIAGNOSIS — R059 Cough, unspecified: Secondary | ICD-10-CM | POA: Diagnosis not present

## 2021-05-30 DIAGNOSIS — J449 Chronic obstructive pulmonary disease, unspecified: Secondary | ICD-10-CM | POA: Diagnosis not present

## 2021-05-30 DIAGNOSIS — F17210 Nicotine dependence, cigarettes, uncomplicated: Secondary | ICD-10-CM | POA: Diagnosis not present

## 2021-05-30 DIAGNOSIS — R0600 Dyspnea, unspecified: Secondary | ICD-10-CM | POA: Diagnosis not present

## 2021-05-30 LAB — SARS CORONAVIRUS 2 (TAT 6-24 HRS): SARS Coronavirus 2: NEGATIVE

## 2021-05-30 MED ORDER — ALBUTEROL SULFATE (2.5 MG/3ML) 0.083% IN NEBU
2.5000 mg | INHALATION_SOLUTION | Freq: Once | RESPIRATORY_TRACT | Status: AC
  Administered 2021-05-30: 2.5 mg via RESPIRATORY_TRACT
  Filled 2021-05-30: qty 3

## 2021-06-07 ENCOUNTER — Other Ambulatory Visit: Payer: Self-pay

## 2021-06-07 ENCOUNTER — Ambulatory Visit (INDEPENDENT_AMBULATORY_CARE_PROVIDER_SITE_OTHER): Payer: Medicaid Other

## 2021-06-07 DIAGNOSIS — R0600 Dyspnea, unspecified: Secondary | ICD-10-CM

## 2021-06-07 LAB — ECHOCARDIOGRAM COMPLETE
AR max vel: 4.19 cm2
AV Area VTI: 3.98 cm2
AV Area mean vel: 4.08 cm2
AV Mean grad: 2 mmHg
AV Peak grad: 4.6 mmHg
Ao pk vel: 1.07 m/s
Area-P 1/2: 3.23 cm2
Calc EF: 53.1 %
S' Lateral: 3.6 cm
Single Plane A2C EF: 50.9 %
Single Plane A4C EF: 55.5 %

## 2021-06-15 ENCOUNTER — Ambulatory Visit (INDEPENDENT_AMBULATORY_CARE_PROVIDER_SITE_OTHER): Payer: Medicaid Other | Admitting: Vascular Surgery

## 2021-06-15 ENCOUNTER — Encounter (HOSPITAL_COMMUNITY): Payer: Self-pay | Admitting: Radiology

## 2021-06-15 ENCOUNTER — Ambulatory Visit (INDEPENDENT_AMBULATORY_CARE_PROVIDER_SITE_OTHER): Payer: Medicaid Other

## 2021-06-15 DIAGNOSIS — I7025 Atherosclerosis of native arteries of other extremities with ulceration: Secondary | ICD-10-CM | POA: Diagnosis not present

## 2021-06-22 ENCOUNTER — Encounter (INDEPENDENT_AMBULATORY_CARE_PROVIDER_SITE_OTHER): Payer: Self-pay | Admitting: *Deleted

## 2021-07-03 ENCOUNTER — Encounter: Payer: Self-pay | Admitting: Cardiology

## 2021-07-03 ENCOUNTER — Ambulatory Visit (INDEPENDENT_AMBULATORY_CARE_PROVIDER_SITE_OTHER): Payer: Medicaid Other | Admitting: Cardiology

## 2021-07-03 ENCOUNTER — Other Ambulatory Visit: Payer: Self-pay

## 2021-07-03 ENCOUNTER — Telehealth: Payer: Self-pay | Admitting: *Deleted

## 2021-07-03 VITALS — BP 134/82 | HR 89 | Ht 70.0 in | Wt 189.0 lb

## 2021-07-03 DIAGNOSIS — I10 Essential (primary) hypertension: Secondary | ICD-10-CM

## 2021-07-03 DIAGNOSIS — I2510 Atherosclerotic heart disease of native coronary artery without angina pectoris: Secondary | ICD-10-CM | POA: Diagnosis not present

## 2021-07-03 DIAGNOSIS — I471 Supraventricular tachycardia: Secondary | ICD-10-CM

## 2021-07-03 DIAGNOSIS — F17200 Nicotine dependence, unspecified, uncomplicated: Secondary | ICD-10-CM

## 2021-07-03 DIAGNOSIS — R0602 Shortness of breath: Secondary | ICD-10-CM

## 2021-07-03 MED ORDER — METOPROLOL SUCCINATE ER 25 MG PO TB24: 25.0000 mg | ORAL_TABLET | Freq: Every day | ORAL | 5 refills | Status: DC

## 2021-07-03 NOTE — Telephone Encounter (Addendum)
Patient returning call for results but states he has an appt with [facility] this afternoon and will discuss in person today.    No further call back needed at this time per patient

## 2021-07-03 NOTE — Patient Instructions (Signed)
Medication Instructions:   Your physician has recommended you make the following change in your medication:    START taking Metoprolol Succinate (Toprol XL) 25 MG once a day.   *If you need a refill on your cardiac medications before your next appointment, please call your pharmacy*   Lab Work: None Ordered If you have labs (blood work) drawn today and your tests are completely normal, you will receive your results only by: MyChart Message (if you have MyChart) OR A paper copy in the mail If you have any lab test that is abnormal or we need to change your treatment, we will call you to review the results.   Testing/Procedures: None Ordered   Follow-Up: At [facility], you and your health needs are our priority.  As part of our continuing mission to provide you with exceptional heart care, we have created designated Provider Care Teams.  These Care Teams include your primary Cardiologist (physician) and Advanced Practice Providers (APPs -  Physician Assistants and Nurse Practitioners) who all work together to provide you with the care you need, when you need it.  We recommend signing up for the patient portal called "MyChart".  Sign up information is provided on this After Visit Summary.  MyChart is used to connect with patients for Virtual Visits (Telemedicine).  Patients are able to view lab/test results, encounter notes, upcoming appointments, etc.  Non-urgent messages can be sent to your provider as well.   To learn more about what you can do with MyChart, go to [email or web address].    Your next appointment:   3 month(s)  The format for your next appointment:   In Person  Provider:   You may see Debbe Odea, MD or one of the following Advanced Practice Providers on your designated Care Team:   Nicolasa Ducking, NP Eula Listen, PA-C    Other Instructions

## 2021-07-03 NOTE — Telephone Encounter (Signed)
Left voicemail message to call back for review of results and recommendations.  

## 2021-07-03 NOTE — Telephone Encounter (Signed)
-----   Message from Rise Mu, PA-C sent at 07/03/2021  8:36 AM EST ----- Outpatient cardiac monitoring showed a predominant rhythm of sinus with an average rate of 87 bpm (range 65 to 193 bpm, 8 runs of SVT occurred with the fastest and longest interval lasting 20 beats.  Patient triggered events were associated with SVT.  Recommendations: -If he is agreeable, start Toprol-XL 25 mg daily to decrease given paroxysmal SVT -In an effort to minimize risk of hypotension with this, would recommend he discontinue amlodipine upon initiating Toprol -Follow-up as planned

## 2021-07-03 NOTE — Progress Notes (Signed)
Cardiology Office Note:    Date:  07/03/2021   ID:  Timothy [name]., DOB 07/08/1960, MRN [id number]  PCP:  [facility] [place]  Cardiologist:  Kate Sable, MD  Advanced Practice Provider:  No care team member to display Electrophysiologist:  None       Referring MD: Ellene Route   Chief Complaint  Patient presents with   Other    2 month follow up -- Patient c.o Chest pain, SOB -- when walking up stairs, and fluttering in chest. Meds reviewed verbally with patient.     History of Present Illness:    Timothy [name]. is a 61 y.o. male with a hx of CAD  (coronary calcifications on chest CT )hypertension, diabetes, hyperlipidemia, COPD, current smoker x40+ years presenting for follow-up.  Last seen in the clinic due to palpitations.  Cardiac monitor was placed to evaluate any significant arrhythmias.  States palpitations alcohol randomly about 2 times a week lasting a couple of minutes.  Denies dizziness, syncope.  Still has shortness of breath when he overexerts himself.  Still smokes.   Prior notes Echo 05/2021 EF 55 to 60% Echocardiogram obtained 05/2020 showed normal systolic and diastolic function, EF 60 to 65%.  PFTs 05/2020 showed moderate to severe COPD.  Coronary artery calcifications noted on prior CT chest for lung cancer screening. placed on clonidine by his psychiatrist because it also helps him sleep.  Past Medical History:  Diagnosis Date   Diabetes mellitus without complication ([place])    Hypercholesteremia    Hypertension     Past Surgical History:  Procedure Laterality Date   AMPUTATION Left 03/22/2020   Procedure: AMPUTATION RAY-1st Ray Left Foot;  Surgeon: [name], [name];  Location: [facility] ORS;  Service: Podiatry;  Laterality: Left;   AMPUTATION TOE Left 03/16/2020   Procedure: AMPUTATION GREAT TOE;  Surgeon: [name], [name];  Location: [facility] ORS;  Service: Podiatry;  Laterality: Left;    COLONOSCOPY WITH PROPOFOL N/A 12/22/2019   Procedure: COLONOSCOPY WITH PROPOFOL;  Surgeon: [name], MD;  Location: [facility] ENDOSCOPY;  Service: Endoscopy;  Laterality: N/A;   LOWER EXTREMITY ANGIOGRAPHY Left 03/18/2020   Procedure: Lower Extremity Angiography;  Surgeon: [name], MD;  Location: [place] [facility] CV LAB;  Service: Cardiovascular;  Laterality: Left;   TONSILLECTOMY     VIDEO ASSISTED THORACOSCOPY (VATS)/DECORTICATION Left 08/15/2020   Procedure: VIDEO ASSISTED THORACOSCOPY (VATS)/DECORTICATION;  Surgeon: [name], MD;  Location: [facility] OR;  Service: Thoracic;  Laterality: Left;   WISDOM TOOTH EXTRACTION      Current Medications: Current Meds  Medication Sig   acetaminophen (TYLENOL) 650 MG CR tablet Take 1,300 mg by mouth every 8 (eight) hours as needed for pain.   albuterol (VENTOLIN HFA) 108 (90 Base) MCG/ACT inhaler INHALE 1 PUFF INTO THE LUNGS ONCE EVERY 6 HOURS AS NEEDED FOR SHORTNESS OF BREATH OR WHEEZING.   amLODipine (NORVASC) 5 MG tablet Take 1 tablet (5 mg total) by mouth once daily.   apixaban (ELIQUIS) 5 MG TABS tablet TAKE ONE TABLET (5MG TOTAL) BY MOUTH TWICE DAILY.   ARIPiprazole (ABILIFY) 10 MG tablet TAKE ONE TABLET (10MG TOTAL) BY MOUTH ONCE DAILY AT BEDTIME.   blood glucose meter kit and supplies KIT Dispense based on patient and insurance preference. Use up to four times daily as directed. (FOR ICD-9 250.00, 250.01).   cloNIDine (CATAPRES) 0.1 MG tablet Take one tablet ( 0.67m total) by mouth every day at bedtime.  Fluticasone-Umeclidin-Vilant (TRELEGY ELLIPTA) 100-62.5-25 MCG/ACT AEPB Inhale 100 mcg into the lungs daily.   gabapentin (NEURONTIN) 400 MG capsule TAKE ONE CAPSULE (400MG TOTAL) BY MOUTH THREE TIMES DAILY.   glipiZIDE (GLUCOTROL) 10 MG tablet TAKE ONE TABLET (10MG TOTAL) BY MOUTH TWICE DAILY.   guaiFENesin (MUCINEX) 600 MG 12 hr tablet Take 1 tablet (600 mg total) by mouth 2 (two) times daily as needed for to loosen phlegm or cough.    magnesium oxide (MAG-OX) 400 MG tablet Take 800 mg by mouth 2 (two) times daily.   melatonin 3 MG TABS tablet TAKE 2 TABLETS (6MG TOTAL) BY MOUTH ONCE DAILY AT BEDTIME.   metFORMIN (GLUCOPHAGE) 1000 MG tablet TAKE ONE TABLET (1,000MG TOTAL) BY MOUTH TWICE DAILY.   metoprolol succinate (TOPROL XL) 25 MG 24 hr tablet Take 1 tablet (25 mg total) by mouth daily.   pantoprazole (PROTONIX) 40 MG tablet TAKE ONE TABLET (40MG TOTAL) BY MOUTH ONCE DAILY.   rosuvastatin (CRESTOR) 5 MG tablet TAKE ONE TABLET (5MG TOTAL) BY MOUTH ONCE DAILY.   venlafaxine XR (EFFEXOR XR) 37.5 MG 24 hr capsule TAKE 2 CAPSULES (75MG TOTAL) BY MOUTH ONCE DAILY.     Allergies:   Bee venom, Cheese, Coffea arabica, and Coffee flavor   Social History   Socioeconomic History   Marital status: Single    Spouse name: Not on file   Number of children: 0   Years of education: Not on file   Highest education level: Not on file  Occupational History   Occupation: Unemployed  Tobacco Use   Smoking status: Every Day    Packs/day: 2.00    Years: 41.00    Pack years: 82.00    Types: Cigarettes   Smokeless tobacco: Never   Tobacco comments:    3-7cigs daily 05/24/2021  Vaping Use   Vaping Use: Never used  Substance and Sexual Activity   Alcohol use: Yes    Alcohol/week: 6.0 standard drinks    Types: 6 Cans of beer per week    Comment: occasionally    Drug use: Not Currently   Sexual activity: Not Currently  Other Topics Concern   Not on file  Social History Narrative   Not on file   Social Determinants of Health   Financial Resource Strain: Not on file  Food Insecurity: Not on file  Transportation Needs: Not on file  Physical Activity: Not on file  Stress: Not on file  Social Connections: Not on file     Family History: The patient's Family history is unknown by patient.  ROS:   Please see the history of present illness.     All other systems reviewed and are negative.  EKGs/Labs/Other Studies  Reviewed:    The following studies were reviewed today:   EKG:  EKG is  ordered today.  The ekg ordered today demonstrates normal sinus rhythm, normal EKG  Recent Labs: 08/10/2020: B Natriuretic Peptide 222.7 12/18/2020: ALT 25; BUN 16; Creatinine, Ser 0.96; Hemoglobin 15.1; Platelets 234; Potassium 4.3; Sodium 137  Recent Lipid Panel    Component Value Date/Time   CHOL 167 06/22/2020 1058   TRIG 335 (H) 06/22/2020 1058   HDL 44 06/22/2020 1058   CHOLHDL 3.8 06/22/2020 1058   LDLCALC 70 06/22/2020 1058     Risk Assessment/Calculations:      Physical Exam:    VS:  BP 134/82 (BP Location: Left Arm, Patient Position: Sitting, Cuff Size: Normal)    Pulse 89    Ht '5\' 10"'  (  1.778 m)    Wt 189 lb (85.7 kg)    SpO2 99%    BMI 27.12 kg/m     Wt Readings from Last 3 Encounters:  07/03/21 189 lb (85.7 kg)  05/24/21 185 lb 12.8 oz (84.3 kg)  05/02/21 190 lb (86.2 kg)     GEN:  Well nourished, well developed in no acute distress HEENT: Normal NECK: No JVD; No carotid bruits LYMPHATICS: No lymphadenopathy CARDIAC: RRR, no murmurs, rubs, gallops RESPIRATORY: Decreased breath sounds at bases, no wheezing ABDOMEN: Soft, non-tender, non-distended MUSCULOSKELETAL:  No edema; No deformity  SKIN: Warm and dry NEUROLOGIC:  Alert and oriented x 3 PSYCHIATRIC:  Normal affect   ASSESSMENT:    1. SVT (supraventricular tachycardia) ([place])   2. SOB (shortness of breath)   3. Coronary artery disease involving native coronary artery of native heart without angina pectoris   4. Primary hypertension   5. Smoking     PLAN:    In order of problems listed above:  Palpitations, cardiac monitor showed paroxysmal SVTs, start Toprol-XL 25 mg daily. Shortness of breath, likely secondary to COPD.  Coronary artery calcifications noted on screening chest CT scan.  Myoview with no ischemia. CAD/coronary calcifications.  Risk factors of hypertension, current smoker.  Myoview 10/2020 no evidence for  ischemia.  LDL at goal.  LDL at goal, on Eliquis for DVT. Hypertension, BP controlled.  Continue Norvasc, clonidine, Toprol-XL.  Takes clonidine because it helps him with sleep.. Current smoker, cessation advised.  Follow-up in 3 months.    Medication Adjustments/Labs and Tests Ordered: Current medicines are reviewed at length with the patient today.  Concerns regarding medicines are outlined above.  Orders Placed This Encounter  Procedures   EKG 12-Lead   Meds ordered this encounter  Medications   metoprolol succinate (TOPROL XL) 25 MG 24 hr tablet    Sig: Take 1 tablet (25 mg total) by mouth daily.    Dispense:  30 tablet    Refill:  5    Patient Instructions  Medication Instructions:   Your physician has recommended you make the following change in your medication:    START taking Metoprolol Succinate (Toprol XL) 25 MG once a day.   *If you need a refill on your cardiac medications before your next appointment, please call your pharmacy*   Lab Work: None Ordered If you have labs (blood work) drawn today and your tests are completely normal, you will receive your results only by: [place] (if you have MyChart) OR A paper copy in the mail If you have any lab test that is abnormal or we need to change your treatment, we will call you to review the results.   Testing/Procedures: None Ordered   Follow-Up: At [facility], you and your health needs are our priority.  As part of our continuing mission to provide you with exceptional heart care, we have created designated Provider Care Teams.  These Care Teams include your primary Cardiologist (physician) and Advanced Practice Providers (APPs -  Physician Assistants and Nurse Practitioners) who all work together to provide you with the care you need, when you need it.  We recommend signing up for the patient portal called "MyChart".  Sign up information is provided on this After Visit Summary.  MyChart is used to  connect with patients for Virtual Visits (Telemedicine).  Patients are able to view lab/test results, encounter notes, upcoming appointments, etc.  Non-urgent messages can be sent to your provider as well.  To learn more about what you can do with MyChart, go to [email or web address].    Your next appointment:   3 month(s)  The format for your next appointment:   In Person  Provider:   You may see Kate Sable, MD or one of the following Advanced Practice Providers on your designated Care Team:   Murray Hodgkins, NP Christell Faith, PA-C    Other Instructions     Signed, Kate Sable, MD  07/03/2021 12:37 PM    [place]

## 2021-07-06 ENCOUNTER — Other Ambulatory Visit: Payer: Self-pay

## 2021-07-06 ENCOUNTER — Ambulatory Visit (INDEPENDENT_AMBULATORY_CARE_PROVIDER_SITE_OTHER): Payer: Medicaid Other | Admitting: Pulmonary Disease

## 2021-07-06 ENCOUNTER — Encounter: Payer: Self-pay | Admitting: Pulmonary Disease

## 2021-07-06 VITALS — BP 126/88 | HR 90 | Temp 98.2°F | Ht 70.0 in | Wt 189.4 lb

## 2021-07-06 DIAGNOSIS — J449 Chronic obstructive pulmonary disease, unspecified: Secondary | ICD-10-CM

## 2021-07-06 DIAGNOSIS — J9611 Chronic respiratory failure with hypoxia: Secondary | ICD-10-CM

## 2021-07-06 DIAGNOSIS — F17210 Nicotine dependence, cigarettes, uncomplicated: Secondary | ICD-10-CM

## 2021-07-06 NOTE — Patient Instructions (Signed)
Your oxygen level did well today.  I think eventually you should be able to come off oxygen during the day but at least have it at nighttime.  I gave you the copy of the report of your foot x-ray so that you can have your primary care order the needed studies on your foot.  Continue using your Trelegy.  We will see you in follow-up in 2 months time call sooner should any new problems arise

## 2021-07-06 NOTE — Progress Notes (Signed)
Subjective:    Patient ID: Timothy [name]., male    DOB: 09-23-1960, 61 y.o.   MRN: [id number] Chief Complaint  Patient presents with   Follow-up    HPI Timothy [name] is a 61 year old current smoker (3 to 7 cigarettes/day 82 PY) who presents for follow-up of COPD and dyspnea.  This is a scheduled appointment.  He was last evaluated by me on 24 May 2021.  He was following up at that time after an episode of empyema with Streptococcus imitis which required VATS.  He was treated at [facility].  He has not had any recurrent symptoms of empyema.  He does have some persistent pain postoperatively but this continues to be getting better.  Also notes persistent mild dyspnea but this is at baseline.  He is now back to using Trelegy and feels that this is doing well for him, he is compliant with the medication.  Previously he thought the Trelegy was responsible for his empyema however he had started the medication only a few days prior to his issues developing.  He is on oxygen at 3 L/min since he was in the hospital for his empyema.  He notes that this helps him.  Any fevers, chills or sweats.  He has had usual morning cough sputum mostly whitish to gray.  Occasional wheezing noted that responds to albuterol.  He does not endorse any other symptomatology today overall feels that he is doing as good as can be expected.  Today he actually looks quite well and does not appear disheveled.   DATA: 06/16/2020 PFT: FEV1 1.54 L or 42% predicted, FVC 2.54 L or 52% predicted, FEV1/FVC 61%, there is bronchodilator response with net change of 14% on FEV1 postbronchodilator.  There is concomitant restrictive physiology.  There is fluttering of the flow-volume loop consistent with possible OSA.  Diffusion capacity moderately reduced. 06/17/2019 one 2D echo: LVEF 60 to 65%, mildly dilated LA, no diastolic dysfunction. 08/21/2020 chest CT: Residual left empyema/pleural rind. 10/25/2020 nuclear stress test: Normal, no  evidence of ischemia  Review of Systems A 10 point review of systems was performed and it is as noted above otherwise negative.  Patient Active Problem List   Diagnosis Date Noted   Atherosclerosis of native arteries of the extremities with ulceration (HCC) 04/18/2021   DJD (degenerative joint disease), lumbosacral 04/18/2021   Leg pain 01/23/2021   Acute deep vein thrombosis (DVT) of left lower extremity (HCC) 01/23/2021   Atherosclerosis of native arteries of extremity with intermittent claudication (HCC) 01/22/2021   MDD (major depressive disorder), recurrent severe, without psychosis (HCC) 12/19/2020   Severe episode of recurrent major depressive disorder, with psychotic features (HCC)    COPD with acute exacerbation (HCC) 12/14/2020   Hx of sleep apnea 12/14/2020   Empyema lung (HCC) 08/15/2020   Type 2 diabetes mellitus with hyperglycemia (HCC) 08/11/2020   Respiratory failure with hypoxia (HCC) 08/11/2020   Hyponatremia 08/11/2020   Chronic diastolic CHF (congestive heart failure) (HCC) 08/11/2020   Anemia 08/11/2020   Abdominal pain 08/11/2020   Empyema (HCC) 08/10/2020   Osteomyelitis (HCC) 03/15/2020   Open wound of foot 01/20/2020   Elevated lipids 01/20/2020   Personal history of colonic polyps    Shortness of breath 11/04/2019   Encounter to establish care 10/15/2019   Essential hypertension 10/15/2019   Controlled diabetes mellitus type 2 with complications (HCC) 10/15/2019   Smoking 10/15/2019   Peripheral neuropathy 10/15/2019   Cramp, abdominal 10/15/2019   History of hepatitis  10/15/2019   History of gastroesophageal reflux (GERD) 10/15/2019   Type 2 diabetes mellitus with diabetic neuropathy, unspecified (HCC) 10/15/2019   Post-traumatic stress disorder, unspecified 07/14/2019   Antisocial personality disorder (HCC) 07/14/2019   Social History   Tobacco Use   Smoking status: Every Day    Packs/day: 2.00    Years: 41.00    Pack years: 82.00    Types:  Cigarettes   Smokeless tobacco: Never   Tobacco comments:    3-10cigs daily 05/24/2021  Substance Use Topics   Alcohol use: Yes    Alcohol/week: 6.0 standard drinks    Types: 6 Cans of beer per week    Comment: occasionally    Allergies  Allergen Reactions   Bee Venom Anaphylaxis   Cheese Anaphylaxis, Swelling and Other (See Comments)    Blue Cheese only   Coffea Arabica Shortness Of Breath   Coffee Flavor Shortness Of Breath   Current Meds  Medication Sig   acetaminophen (TYLENOL) 650 MG CR tablet Take 1,300 mg by mouth every 8 (eight) hours as needed for pain.   albuterol (VENTOLIN HFA) 108 (90 Base) MCG/ACT inhaler INHALE 1 PUFF INTO THE LUNGS ONCE EVERY 6 HOURS AS NEEDED FOR SHORTNESS OF BREATH OR WHEEZING.   amLODipine (NORVASC) 5 MG tablet Take 1 tablet (5 mg total) by mouth once daily.   apixaban (ELIQUIS) 5 MG TABS tablet TAKE ONE TABLET (5MG  TOTAL) BY MOUTH TWICE DAILY.   ARIPiprazole (ABILIFY) 10 MG tablet TAKE ONE TABLET (10MG  TOTAL) BY MOUTH ONCE DAILY AT BEDTIME.   blood glucose meter kit and supplies KIT Dispense based on patient and insurance preference. Use up to four times daily as directed. (FOR ICD-9 250.00, 250.01).   cloNIDine (CATAPRES) 0.1 MG tablet Take one tablet ( 0.1mg  total) by mouth every day at bedtime.   Fluticasone-Umeclidin-Vilant (TRELEGY ELLIPTA) 100-62.5-25 MCG/ACT AEPB Inhale 100 mcg into the lungs daily.   gabapentin (NEURONTIN) 400 MG capsule TAKE ONE CAPSULE (400MG  TOTAL) BY MOUTH THREE TIMES DAILY.   glipiZIDE (GLUCOTROL) 10 MG tablet TAKE ONE TABLET (10MG  TOTAL) BY MOUTH TWICE DAILY.   magnesium oxide (MAG-OX) 400 MG tablet Take 800 mg by mouth 2 (two) times daily.   metFORMIN (GLUCOPHAGE) 1000 MG tablet TAKE ONE TABLET (1,000MG  TOTAL) BY MOUTH TWICE DAILY.   metoprolol succinate (TOPROL XL) 25 MG 24 hr tablet Take 1 tablet (25 mg total) by mouth daily.   pantoprazole (PROTONIX) 40 MG tablet TAKE ONE TABLET (40MG  TOTAL) BY MOUTH ONCE DAILY.    rosuvastatin (CRESTOR) 5 MG tablet TAKE ONE TABLET (5MG  TOTAL) BY MOUTH ONCE DAILY.   venlafaxine XR (EFFEXOR XR) 37.5 MG 24 hr capsule TAKE 2 CAPSULES (75MG  TOTAL) BY MOUTH ONCE DAILY.   [DISCONTINUED] guaiFENesin (MUCINEX) 600 MG 12 hr tablet Take 1 tablet (600 mg total) by mouth 2 (two) times daily as needed for to loosen phlegm or cough.   [DISCONTINUED] melatonin 3 MG TABS tablet TAKE 2 TABLETS (6MG  TOTAL) BY MOUTH ONCE DAILY AT BEDTIME.    There is no immunization history on file for this patient.     Objective:   Physical Exam BP 126/88 (BP Location: Left Arm, Patient Position: Sitting, Cuff Size: Normal)   Pulse 90   Temp 98.2 F (36.8 C) (Oral)   Ht 5\' 10"  (1.778 m)   Wt 189 lb 6.4 oz (85.9 kg)   SpO2 98%   BMI 27.18 kg/m    GENERAL: Well-developed well-nourished gentleman in no acute distress.  Fully  ambulatory.  No conversational dyspnea.  Comfortable on nasal cannula O2. HEAD: Normocephalic, atraumatic.  EYES: Pupils equal, round, reactive to light.  No scleral icterus. Injected conjunctiva. MOUTH: Nose/mouth/throat not examined due to masking requirements for COVID 19. NECK: Supple. No thyromegaly. Trachea midline. No JVD.  No adenopathy. PULMONARY: Good air entry symmetrically bilaterally.Coarse breath sounds with no other adventitious sounds. CARDIOVASCULAR: S1 and S2. Regular rate and rhythm.  No rubs, murmurs or gallops heard. ABDOMEN: Benign. MUSCULOSKELETAL: No clubbing, no edema.   NEUROLOGIC: No focal deficit, speech is fluent, SKIN: Intact,warm,dry. PSYCH: Mood and behavior  Titrated O2 to 2 L/min.    Assessment & Plan:     ICD-10-CM   1. Stage 3 severe COPD by GOLD classification (HCC)  J44.9    Continue Trelegy Ellipta Continue as needed albuterol    2. Chronic respiratory failure with hypoxia (HCC)  J96.11    Continue oxygen at 2 L/min Patient titrated down during visit Maintained oxygen saturations on 2 L/min    3. Tobacco dependence due to  cigarettes  F17.210    Patient counseled with regards to discontinuation of smoking Total counseling time 3 to 5 minutes     Will see the patient in follow-up in 2 months time call sooner should any new problems arise.   Gailen Shelter, MD Advanced Bronchoscopy PCCM  [facility]    *This note was dictated using voice recognition software/Dragon.  Despite best efforts to proofread, errors can occur which can change the meaning. Any transcriptional errors that result from this process are unintentional and may not be fully corrected at the time of dictation.

## 2021-08-08 ENCOUNTER — Other Ambulatory Visit: Payer: Self-pay

## 2021-08-08 ENCOUNTER — Encounter: Payer: Self-pay | Admitting: Emergency Medicine

## 2021-08-08 ENCOUNTER — Inpatient Hospital Stay
Admission: EM | Admit: 2021-08-08 | Discharge: 2021-08-12 | DRG: 603 | Disposition: A | Discharge status: Home / Self Care | Payer: Medicaid Other | Attending: Internal Medicine | Admitting: Internal Medicine

## 2021-08-08 ENCOUNTER — Emergency Department: Payer: Medicaid Other

## 2021-08-08 DIAGNOSIS — E1165 Type 2 diabetes mellitus with hyperglycemia: Secondary | ICD-10-CM

## 2021-08-08 DIAGNOSIS — I10 Essential (primary) hypertension: Secondary | ICD-10-CM | POA: Diagnosis present

## 2021-08-08 DIAGNOSIS — F17210 Nicotine dependence, cigarettes, uncomplicated: Secondary | ICD-10-CM | POA: Diagnosis present

## 2021-08-08 DIAGNOSIS — L03211 Cellulitis of face: Secondary | ICD-10-CM | POA: Diagnosis present

## 2021-08-08 DIAGNOSIS — Z79899 Other long term (current) drug therapy: Secondary | ICD-10-CM

## 2021-08-08 DIAGNOSIS — E222 Syndrome of inappropriate secretion of antidiuretic hormone: Secondary | ICD-10-CM | POA: Diagnosis present

## 2021-08-08 DIAGNOSIS — F17200 Nicotine dependence, unspecified, uncomplicated: Secondary | ICD-10-CM | POA: Diagnosis present

## 2021-08-08 DIAGNOSIS — Z7984 Long term (current) use of oral hypoglycemic drugs: Secondary | ICD-10-CM

## 2021-08-08 DIAGNOSIS — Z91030 Bee allergy status: Secondary | ICD-10-CM

## 2021-08-08 DIAGNOSIS — E785 Hyperlipidemia, unspecified: Secondary | ICD-10-CM | POA: Diagnosis present

## 2021-08-08 DIAGNOSIS — Z91018 Allergy to other foods: Secondary | ICD-10-CM | POA: Diagnosis not present

## 2021-08-08 DIAGNOSIS — Z7951 Long term (current) use of inhaled steroids: Secondary | ICD-10-CM

## 2021-08-08 DIAGNOSIS — J449 Chronic obstructive pulmonary disease, unspecified: Secondary | ICD-10-CM | POA: Diagnosis present

## 2021-08-08 DIAGNOSIS — R739 Hyperglycemia, unspecified: Secondary | ICD-10-CM

## 2021-08-08 DIAGNOSIS — Z89412 Acquired absence of left great toe: Secondary | ICD-10-CM

## 2021-08-08 DIAGNOSIS — Z20822 Contact with and (suspected) exposure to covid-19: Secondary | ICD-10-CM | POA: Diagnosis present

## 2021-08-08 DIAGNOSIS — E871 Hypo-osmolality and hyponatremia: Secondary | ICD-10-CM | POA: Diagnosis present

## 2021-08-08 DIAGNOSIS — L0201 Cutaneous abscess of face: Principal | ICD-10-CM

## 2021-08-08 DIAGNOSIS — Z7901 Long term (current) use of anticoagulants: Secondary | ICD-10-CM

## 2021-08-08 LAB — CBC WITH DIFFERENTIAL/PLATELET
Abs Immature Granulocytes: 0.09 10*3/uL — ABNORMAL HIGH (ref 0.00–0.07)
Basophils Absolute: 0.1 10*3/uL (ref 0.0–0.1)
Basophils Relative: 1 %
Eosinophils Absolute: 0.1 10*3/uL (ref 0.0–0.5)
Eosinophils Relative: 1 %
HCT: 42.4 % (ref 39.0–52.0)
Hemoglobin: 14.5 g/dL (ref 13.0–17.0)
Immature Granulocytes: 1 %
Lymphocytes Relative: 10 %
Lymphs Abs: 1.3 10*3/uL (ref 0.7–4.0)
MCH: 31.7 pg (ref 26.0–34.0)
MCHC: 34.2 g/dL (ref 30.0–36.0)
MCV: 92.8 fL (ref 80.0–100.0)
Monocytes Absolute: 1 10*3/uL (ref 0.1–1.0)
Monocytes Relative: 8 %
Neutro Abs: 10.9 10*3/uL — ABNORMAL HIGH (ref 1.7–7.7)
Neutrophils Relative %: 79 %
Platelets: 266 10*3/uL (ref 150–400)
RBC: 4.57 MIL/uL (ref 4.22–5.81)
RDW: 13 % (ref 11.5–15.5)
WBC: 13.5 10*3/uL — ABNORMAL HIGH (ref 4.0–10.5)
nRBC: 0 % (ref 0.0–0.2)

## 2021-08-08 LAB — BASIC METABOLIC PANEL
Anion gap: 14 (ref 5–15)
BUN: 16 mg/dL (ref 6–20)
CO2: 23 mmol/L (ref 22–32)
Calcium: 8.9 mg/dL (ref 8.9–10.3)
Chloride: 94 mmol/L — ABNORMAL LOW (ref 98–111)
Creatinine, Ser: 0.94 mg/dL (ref 0.61–1.24)
GFR, Estimated: >60 mL/min (ref >60)
Glucose, Bld: 347 mg/dL — ABNORMAL HIGH (ref 70–99)
Potassium: 4.5 mmol/L (ref 3.5–5.1)
Sodium: 131 mmol/L — ABNORMAL LOW (ref 135–145)

## 2021-08-08 LAB — MRSA NEXT GEN BY PCR, NASAL: MRSA by PCR Next Gen: NOT DETECTED

## 2021-08-08 LAB — RESP PANEL BY RT-PCR (FLU A&B, COVID) ARPGX2
Influenza A by PCR: NEGATIVE
Influenza B by PCR: NEGATIVE
SARS Coronavirus 2 by RT PCR: NEGATIVE

## 2021-08-08 LAB — GLUCOSE, CAPILLARY: Glucose-Capillary: 246 mg/dL — ABNORMAL HIGH (ref 70–99)

## 2021-08-08 LAB — OSMOLALITY: Osmolality: 284 mOsm/kg (ref 275–295)

## 2021-08-08 MED ORDER — SENNOSIDES-DOCUSATE SODIUM 8.6-50 MG PO TABS
1.0000 | ORAL_TABLET | Freq: Every evening | ORAL | Status: DC | PRN
  Administered 2021-08-11: 1 via ORAL
  Filled 2021-08-08: qty 1

## 2021-08-08 MED ORDER — METOPROLOL SUCCINATE ER 25 MG PO TB24
25.0000 mg | ORAL_TABLET | Freq: Every day | ORAL | Status: DC
  Administered 2021-08-09 – 2021-08-12 (×4): 25 mg via ORAL
  Filled 2021-08-08 (×4): qty 1

## 2021-08-08 MED ORDER — VANCOMYCIN HCL IN DEXTROSE 1-5 GM/200ML-% IV SOLN
1000.0000 mg | Freq: Two times a day (BID) | INTRAVENOUS | Status: DC
  Administered 2021-08-09 – 2021-08-11 (×5): 1000 mg via INTRAVENOUS
  Filled 2021-08-08 (×8): qty 200

## 2021-08-08 MED ORDER — HYDROMORPHONE HCL 1 MG/ML IJ SOLN
1.0000 mg | INTRAMUSCULAR | Status: DC | PRN
  Administered 2021-08-09 – 2021-08-12 (×8): 1 mg via INTRAVENOUS
  Filled 2021-08-08 (×9): qty 1

## 2021-08-08 MED ORDER — HYDROCODONE-ACETAMINOPHEN 5-325 MG PO TABS
1.0000 | ORAL_TABLET | Freq: Four times a day (QID) | ORAL | Status: DC | PRN
  Administered 2021-08-09 – 2021-08-12 (×6): 1 via ORAL
  Filled 2021-08-08 (×6): qty 1

## 2021-08-08 MED ORDER — MORPHINE SULFATE (PF) 4 MG/ML IV SOLN
4.0000 mg | INTRAVENOUS | Status: DC | PRN
  Administered 2021-08-08 – 2021-08-10 (×5): 4 mg via INTRAVENOUS
  Filled 2021-08-08 (×6): qty 1

## 2021-08-08 MED ORDER — IOHEXOL 300 MG/ML  SOLN
75.0000 mL | Freq: Once | INTRAMUSCULAR | Status: AC | PRN
  Administered 2021-08-08: 75 mL via INTRAVENOUS
  Filled 2021-08-08: qty 75

## 2021-08-08 MED ORDER — VANCOMYCIN HCL 2000 MG/400ML IV SOLN
2000.0000 mg | Freq: Once | INTRAVENOUS | Status: AC
  Administered 2021-08-08: 2000 mg via INTRAVENOUS
  Filled 2021-08-08: qty 400

## 2021-08-08 MED ORDER — SODIUM CHLORIDE 0.9 % IV BOLUS
1000.0000 mL | Freq: Once | INTRAVENOUS | Status: AC
Start: 2021-08-08 — End: 2021-08-09
  Administered 2021-08-08: 1000 mL via INTRAVENOUS

## 2021-08-08 MED ORDER — IPRATROPIUM-ALBUTEROL 0.5-2.5 (3) MG/3ML IN SOLN: 3.0000 mL | Freq: Four times a day (QID) | RESPIRATORY_TRACT | Status: DC | PRN

## 2021-08-08 MED ORDER — LACTATED RINGERS IV SOLN: INTRAVENOUS | Status: DC

## 2021-08-08 MED ORDER — ALBUTEROL SULFATE (2.5 MG/3ML) 0.083% IN NEBU: 3.0000 mL | INHALATION_SOLUTION | RESPIRATORY_TRACT | Status: DC | PRN

## 2021-08-08 MED ORDER — ACETAMINOPHEN 325 MG PO TABS
650.0000 mg | ORAL_TABLET | Freq: Four times a day (QID) | ORAL | Status: DC | PRN
  Administered 2021-08-09: 650 mg via ORAL
  Filled 2021-08-08: qty 2

## 2021-08-08 MED ORDER — INSULIN ASPART 100 UNIT/ML IJ SOLN
0.0000 [IU] | Freq: Three times a day (TID) | INTRAMUSCULAR | Status: DC
  Administered 2021-08-08: 5 [IU] via SUBCUTANEOUS
  Administered 2021-08-09: 12 [IU] via SUBCUTANEOUS
  Filled 2021-08-08 (×2): qty 1

## 2021-08-08 MED ORDER — ONDANSETRON HCL 4 MG PO TABS: 4.0000 mg | ORAL_TABLET | Freq: Four times a day (QID) | ORAL | Status: DC | PRN

## 2021-08-08 MED ORDER — UMECLIDINIUM BROMIDE 62.5 MCG/ACT IN AEPB
1.0000 | INHALATION_SPRAY | Freq: Every day | RESPIRATORY_TRACT | Status: DC
  Administered 2021-08-09 – 2021-08-12 (×4): 1 via RESPIRATORY_TRACT
  Filled 2021-08-08: qty 7

## 2021-08-08 MED ORDER — PANTOPRAZOLE SODIUM 40 MG PO TBEC
40.0000 mg | DELAYED_RELEASE_TABLET | Freq: Every day | ORAL | Status: DC
  Administered 2021-08-09 – 2021-08-12 (×4): 40 mg via ORAL
  Filled 2021-08-08 (×4): qty 1

## 2021-08-08 MED ORDER — ENOXAPARIN SODIUM 40 MG/0.4ML IJ SOSY
40.0000 mg | PREFILLED_SYRINGE | INTRAMUSCULAR | Status: DC
  Administered 2021-08-08 – 2021-08-11 (×4): 40 mg via SUBCUTANEOUS
  Filled 2021-08-08 (×4): qty 0.4

## 2021-08-08 MED ORDER — LIDOCAINE HCL (PF) 1 % IJ SOLN
5.0000 mL | Freq: Once | INTRAMUSCULAR | Status: AC
  Administered 2021-08-08: 5 mL via INTRADERMAL
  Filled 2021-08-08: qty 5

## 2021-08-08 MED ORDER — NICOTINE 14 MG/24HR TD PT24
14.0000 mg | MEDICATED_PATCH | Freq: Every day | TRANSDERMAL | Status: DC
  Administered 2021-08-08 – 2021-08-09 (×2): 14 mg via TRANSDERMAL
  Filled 2021-08-08 (×3): qty 1

## 2021-08-08 MED ORDER — ONDANSETRON HCL 4 MG/2ML IJ SOLN: 4.0000 mg | Freq: Four times a day (QID) | INTRAMUSCULAR | Status: DC | PRN

## 2021-08-08 MED ORDER — DEXAMETHASONE SODIUM PHOSPHATE 10 MG/ML IJ SOLN
10.0000 mg | Freq: Three times a day (TID) | INTRAMUSCULAR | Status: DC
  Administered 2021-08-08 – 2021-08-09 (×2): 10 mg via INTRAVENOUS
  Filled 2021-08-08 (×4): qty 1

## 2021-08-08 MED ORDER — ONDANSETRON HCL 4 MG/2ML IJ SOLN
4.0000 mg | Freq: Once | INTRAMUSCULAR | Status: AC
  Administered 2021-08-08: 4 mg via INTRAVENOUS
  Filled 2021-08-08: qty 2

## 2021-08-08 MED ORDER — FLUTICASONE FUROATE-VILANTEROL 100-25 MCG/ACT IN AEPB
1.0000 | INHALATION_SPRAY | Freq: Every day | RESPIRATORY_TRACT | Status: DC
  Administered 2021-08-09 – 2021-08-12 (×4): 1 via RESPIRATORY_TRACT
  Filled 2021-08-08: qty 28

## 2021-08-08 MED ORDER — AMLODIPINE BESYLATE 5 MG PO TABS
5.0000 mg | ORAL_TABLET | Freq: Every day | ORAL | Status: DC
  Administered 2021-08-09 – 2021-08-12 (×4): 5 mg via ORAL
  Filled 2021-08-08 (×4): qty 1

## 2021-08-08 MED ORDER — CLONIDINE HCL 0.1 MG PO TABS
0.1000 mg | ORAL_TABLET | Freq: Every day | ORAL | Status: DC
  Administered 2021-08-08 – 2021-08-11 (×4): 0.1 mg via ORAL
  Filled 2021-08-08 (×4): qty 1

## 2021-08-08 MED ORDER — INSULIN ASPART 100 UNIT/ML IJ SOLN
6.0000 [IU] | Freq: Once | INTRAMUSCULAR | Status: AC
  Administered 2021-08-08: 6 [IU] via SUBCUTANEOUS
  Filled 2021-08-08: qty 1

## 2021-08-08 MED ORDER — LIDOCAINE-EPINEPHRINE-TETRACAINE (LET) TOPICAL GEL
3.0000 mL | Freq: Once | TOPICAL | Status: AC
  Administered 2021-08-08: 3 mL via TOPICAL
  Filled 2021-08-08: qty 3

## 2021-08-08 MED ORDER — ROSUVASTATIN CALCIUM 10 MG PO TABS
5.0000 mg | ORAL_TABLET | Freq: Every day | ORAL | Status: DC
  Administered 2021-08-08 – 2021-08-11 (×4): 5 mg via ORAL
  Filled 2021-08-08 (×4): qty 1

## 2021-08-08 MED ORDER — ACETAMINOPHEN 650 MG RE SUPP: 650.0000 mg | Freq: Four times a day (QID) | RECTAL | Status: DC | PRN

## 2021-08-08 NOTE — Assessment & Plan Note (Addendum)
Initial sodium 125, likely pseudohyponatremia in the setting of hyperglycemia with glucose above 700. Sodium near normal 133 today. -- Monitor BMP

## 2021-08-08 NOTE — H&P (Signed)
History and Physical    Patient: Timothy [name]. OZY:[id number] DOB: [birth year] DOA: 08/08/2021 DOS: the patient was seen and examined on 08/08/2021 PCP: Ellene Route  Patient coming from: Home  Chief Complaint:  Chief Complaint  Patient presents with   Abscess    HPI: [name]. is a 61 y.o. male with medical history significant of DMT2, COPD, HTN who presents with left-sided facial pain and swelling that started 3 to 4 days ago.  He thought initially had a small pimple and he tried to squeeze on the left side of his face.  He has not had worsening pain over the last few days with increasing swelling and redness of his face.  Swelling is now extending into his left eyelids.  He is able to move his eyes without pain and he has no change in vision.  Reports left-sided face is tender to palpation.  His left eye is have closed due to the swelling.  Reports he has had chills but no fever.  Eyes headache.  Denies neck pain or stiffness and he is able to swallow normally.  Reports he has not been taking his blood sugars at home as he lost his glucometer and a recent move.  States he has been taking metformin and glipizide as prescribed.  Smokes 1 pack of cigarettes a day.  Denies illicit drug use or alcohol use.  Review of Systems: As mentioned in the history of present illness. All other systems reviewed and are negative. Past Medical History:  Diagnosis Date   Diabetes mellitus without complication ([place])    Hypercholesteremia    Hypertension    Past Surgical History:  Procedure Laterality Date   AMPUTATION Left 03/22/2020   Procedure: AMPUTATION RAY-1st Ray Left Foot;  Surgeon: [name], [name];  Location: [facility] ORS;  Service: Podiatry;  Laterality: Left;   AMPUTATION TOE Left 03/16/2020   Procedure: AMPUTATION GREAT TOE;  Surgeon: [name], [name];  Location: [facility] ORS;  Service: Podiatry;  Laterality: Left;   COLONOSCOPY WITH PROPOFOL N/A 12/22/2019   Procedure:  COLONOSCOPY WITH PROPOFOL;  Surgeon: [name], MD;  Location: [facility] ENDOSCOPY;  Service: Endoscopy;  Laterality: N/A;   LOWER EXTREMITY ANGIOGRAPHY Left 03/18/2020   Procedure: Lower Extremity Angiography;  Surgeon: [name], MD;  Location: [place] CV LAB;  Service: Cardiovascular;  Laterality: Left;   TONSILLECTOMY     VIDEO ASSISTED THORACOSCOPY (VATS)/DECORTICATION Left 08/15/2020   Procedure: VIDEO ASSISTED THORACOSCOPY (VATS)/DECORTICATION;  Surgeon: [name], MD;  Location: [facility];  Service: Thoracic;  Laterality: Left;   WISDOM TOOTH EXTRACTION     Social History:  reports that he has been smoking cigarettes. He has a 82.00 pack-year smoking history. He has never used smokeless tobacco. He reports current alcohol use of about 6.0 standard drinks per week. He reports that he does not currently use drugs.  Allergies  Allergen Reactions   Bee Venom Anaphylaxis   Cheese Anaphylaxis, Swelling and Other (See Comments)    Blue Cheese only   Coffea Arabica Shortness Of Breath   Coffee Flavor Shortness Of Breath    Family History  Family history unknown: Yes    Prior to Admission medications   Medication Sig Start Date End Date Taking? Authorizing Provider  acetaminophen (TYLENOL) 650 MG CR tablet Take 1,300 mg by mouth every 8 (eight) hours as needed for pain.    [provider]  albuterol (VENTOLIN HFA) 108 (90 Base) MCG/ACT inhaler INHALE 1 PUFF INTO  THE LUNGS ONCE EVERY 6 HOURS AS NEEDED FOR SHORTNESS OF BREATH OR WHEEZING. 01/16/21     amLODipine (NORVASC) 5 MG tablet Take 1 tablet (5 mg total) by mouth once daily. 01/16/21     apixaban (ELIQUIS) 5 MG TABS tablet TAKE ONE TABLET (5MG TOTAL) BY MOUTH TWICE DAILY. 01/16/21     ARIPiprazole (ABILIFY) 10 MG tablet TAKE ONE TABLET (10MG TOTAL) BY MOUTH ONCE DAILY AT BEDTIME. 01/16/21     blood glucose meter kit and supplies KIT Dispense based on patient and insurance preference. Use up to four times daily as  directed. (FOR ICD-9 250.00, 250.01). 10/15/19   Iloabachie, Chioma E, NP  cloNIDine (CATAPRES) 0.1 MG tablet Take one tablet ( 0.12m total) by mouth every day at bedtime. 01/16/21     Fluticasone-Umeclidin-Vilant (TRELEGY ELLIPTA) 100-62.5-25 MCG/ACT AEPB Inhale 100 mcg into the lungs daily. 04/19/21   WMartyn Ehrich NP  gabapentin (NEURONTIN) 400 MG capsule TAKE ONE CAPSULE (400MG TOTAL) BY MOUTH THREE TIMES DAILY. 01/16/21     glipiZIDE (GLUCOTROL) 10 MG tablet TAKE ONE TABLET (10MG TOTAL) BY MOUTH TWICE DAILY. 02/16/21   Iloabachie, Chioma E, NP  magnesium oxide (MAG-OX) 400 MG tablet Take 800 mg by mouth 2 (two) times daily.    [provider]  metFORMIN (GLUCOPHAGE) 1000 MG tablet TAKE ONE TABLET (1,000MG TOTAL) BY MOUTH TWICE DAILY. 01/16/21     metoprolol succinate (TOPROL XL) 25 MG 24 hr tablet Take 1 tablet (25 mg total) by mouth daily. 07/03/21   AKate Sable MD  pantoprazole (PROTONIX) 40 MG tablet TAKE ONE TABLET (40MG TOTAL) BY MOUTH ONCE DAILY. 01/16/21     rosuvastatin (CRESTOR) 5 MG tablet TAKE ONE TABLET (5MG TOTAL) BY MOUTH ONCE DAILY. 01/16/21     venlafaxine XR (EFFEXOR XR) 37.5 MG 24 hr capsule TAKE 2 CAPSULES (75MG TOTAL) BY MOUTH ONCE DAILY. 01/16/21     citalopram (CELEXA) 20 MG tablet Take 1 tablet (20 mg total) by mouth daily. Patient taking differently: Take 20 mg by mouth at bedtime. 11/09/20 01/17/21  ILangston Reusing NP    Physical Exam: Vitals:   08/08/21 1255 08/08/21 1501 08/08/21 1615  BP: 131/89  (!) 162/99  Pulse: 96  89  Resp: 17  16  Temp: 99 F (37.2 C)    TempSrc: Oral    SpO2: 96%  92%  Weight:  85.9 kg   Height:  '5\' 10"'  (1.778 m)    General: WDWN, Alert and oriented x3.  Eyes: EOMI, PERRL, lids and conjunctivae normal.  Sclera nonicteric HENT:  [place]  external ears normal.  Nares patent without epistasis.  Has erythema of left side of face extending from ear to left eyelids. Swelling of left eyelid. Flucutation of left face with golf ball  sized density with minimal purulent drainage. Tender to palpation. Mucous membranes are moist.  Neck: Soft, normal range of motion, supple, no masses, Trachea midline Respiratory: Equal breath sounds with diffuse scattered  rales. no wheezing, no crackles. Normal respiratory effort. No accessory muscle use.  Cardiovascular: Regular rate and rhythm, no murmurs / rubs / gallops. No extremity edema. 2+ pedal pulses.  Abdomen: Soft, no tenderness, nondistended, no rebound or guarding.  No masses palpated. Bowel sounds normoactive Musculoskeletal: FROM. no cyanosis. Normal muscle tone.  Skin: Warm, dry, no ulcers.  No petechiae or purpura. Neurologic: CN 2-12 grossly intact.  Normal speech. Sensation intact Psychiatric: Normal judgment and insight.  Normal mood.    Data Reviewed: Lab Work:  WBC 13,500 hemoglobin 14.5 hematocrit 42.4 platelets 266,000 sodium 131 potassium 4.5 chloride 94 bicarb 23 creatinine 0.94 BUN 16 glucose 347 calcium 8.9 COVID pending  CT of head shows no acute intracranial pathology CT maxillofacial with contrast shows extensive left periorbital soft tissue swelling consistent with cellulitis and a 4.2 x 3.8 x 1.8 density over the left side of the face concerning for abscess extending inflammatory changes superiorly along the temporalis muscle and inferiorly on the left side of the face and neck  Assessment and Plan: * Facial abscess Mr. Zuckerman is admitted to Med Surg floor.  Culture obtained in ER and will be monitored.  Consult ENT for evaluation with facial abscess that may need I&D in OR. Discussed with Dr. Tami Ribas who will see in am.  Started on Vancomycin for antibiotic coverage. Pain control provided with dilaudid overnight for severe pain  Facial cellulitis Started on antibiotics as above.  Check CBC CMP in am  Uncontrolled type 2 diabetes mellitus with hyperglycemia (HCC) Hold metformin for 48 hours after having IV contrast for CT scan. Monitor blood  glucose qac qhs. Provide SSI as needed for glycemic control.  Check HgbA1c.  Pt needs a glucometer for home as his got lost in move he states Continue statin  Essential hypertension- (present on admission) Continue with norvasc, clonidine, metoprolol. Monitor BP  COPD (chronic obstructive pulmonary disease) ([place]) Continue trelegy elliptica daily. duoneb q 6 hour as needed.  Incentive spirometer every 2 hours while awake.  Advised to quit smoking. Given nicotine patch to prevent withdrawls.   Hyponatremia- (present on admission) Check serum osmolarity and urine sodium. Hx of hyponatremia. 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 Tami Ribas  Family Communication: Diagnosis and plan discussed with patient.  Patient verbalized understanding agrees with plan.  Further recommendation as clinically indicated  Author: Eben Burow, MD 08/08/2021 6:07 PM  For on call review [email or web address].

## 2021-08-08 NOTE — Assessment & Plan Note (Addendum)
A1c 11.9%.  Patient was recently resumed on metformin and glipizide by PCP.  Required insulin drip in the ICU, weaned off morning of 2/23.  -- Diabetes coordinator following, appreciate recommendations -- Discharging with 20 units insulin glargine daily -- Covered here with sliding scale NovoLog -- Resume prior oral metformin and glipizide at discharge, with basal insulin -- Close PCP follow up.  May require addition of mealtime insulin, reassess.

## 2021-08-08 NOTE — Assessment & Plan Note (Addendum)
Stable, not acutely exacerbated. Continue trelegy elliptica daily.  Duoneb q 6 hour as needed.

## 2021-08-08 NOTE — Assessment & Plan Note (Addendum)
See facial abscess plan

## 2021-08-08 NOTE — ED Triage Notes (Signed)
Pt presents via POV with c/o abscess to left face. Swelling has spread to left eye and pt unable to fully open left eye. Tenderness to left side of face and down left side of neck. Pt denies fevers, chills, N/V at home.

## 2021-08-08 NOTE — Assessment & Plan Note (Signed)
Continue with norvasc, clonidine, metoprolol. Monitor BP

## 2021-08-08 NOTE — Consult Note (Signed)
Pharmacy Antibiotic Note  Timothy [name]. is a 61 y.o. male w/ h/o DMT2, COPD, HTN who presents with left-sided facial pain and swelling that started 3 to 4 days ago prior to being admitted on 08/08/2021 for facial abscess/cellulitis.  Pharmacy has been consulted for vancomycin dosing.  Plan: Vancomycin 2g loading dose followed by vancomycin 1g q12h Goal AUC 400-550  Est AUC: 420; Cmax: 26.8; Cmin: 11.5 SCr 0.94; IBW; Vd 0.72  F/u MRSA PCR ordered & cultures.   Height: 5\' 10"  (177.8 cm) Weight: 85.9 kg (189 lb 6 oz) IBW/kg (Calculated) : 73  Temp (24hrs), Avg:99 F (37.2 C), Min:99 F (37.2 C), Max:99 F (37.2 C)  Recent Labs  Lab 08/08/21 1256  WBC 13.5*  CREATININE 0.94    Estimated Creatinine Clearance: 87.4 mL/min (by C-G formula based on SCr of 0.94 mg/dL).    Allergies  Allergen Reactions   Bee Venom Anaphylaxis   Cheese Anaphylaxis, Swelling and Other (See Comments)    Blue Cheese only   Coffea Arabica Shortness Of Breath   Coffee Flavor Shortness Of Breath    Antimicrobials this admission: Vancomycin (2/21>>  Dose adjustments this admission: CTM and adjust PRN  Microbiology results: 2/21 Wound Cx: surgical/deep wound facial abscess - pending 2/21 Flu/Cov: Negative 2/21 MRSA PCR: ordered  Thank you for allowing pharmacy to be a part of this patients care.  3/21, PharmD, [facility] Clinical Pharmacist 08/08/2021 6:34 PM

## 2021-08-08 NOTE — ED Notes (Signed)
Informed RN bed assigned 

## 2021-08-08 NOTE — ED Notes (Signed)
Pt to ED for abscess on L face, upper cheek area. States abscess began around 08/01/21 and has just gotten worse. Appears reddened and swollen.

## 2021-08-08 NOTE — Assessment & Plan Note (Addendum)
ENT following, appreciate recommendations. Bedside I&D but ENT morning of 2/23. ENT plans for I&D under anesthesia in the OR later today. Initially on empiric IV vancomycin. Cultures grew MSSA, transitioned to Ancef. ENT recommends d/c on PO Cipro 500 mg BID x 2 weeks, and topical gentamicin BID.   Follow up Monday morning with ENT in clinic.  Patient has been afebrile, clinically improving and stable for discharge home with close ENT follow up.

## 2021-08-08 NOTE — ED Provider Triage Note (Signed)
Emergency Medicine Provider Triage Evaluation Note  Timothy [name]. , a 61 y.o. male  was evaluated in triage.  Pt complains of abscess to the left side of the face, started as an ingrown hair?,  Got much worse overnight.  The left eye is closed with redness and swelling, tenderness into the neck, cannot open his jaw.  Review of Systems  Positive: Swelling, infection, pain Negative: Fever  Physical Exam  BP 131/89    Pulse 96    Temp 99 F (37.2 C) (Oral)    Resp 17    SpO2 96%  Gen:   Awake, no distress   Resp:  Normal effort  MSK:   Moves extremities without difficulty  Other:  Large amount of redness and swelling noted to the left preauricular area, spreading over to the left eye with the left upper lid being swollen, patient is tender through the left jaw and into the neck  Medical Decision Making  Medically screening exam initiated at 12:55 PM.  Appropriate orders placed.  Timothy [name]. was informed that the remainder of the evaluation will be completed by another provider, this initial triage assessment does not replace that evaluation, and the importance of remaining in the ED until their evaluation is complete.  Labs, CT maxillofacial with contrast, CT of the head   Faythe Ghee, PA-C 08/08/21 1257

## 2021-08-08 NOTE — ED Provider Notes (Signed)
[facility] Provider Note    Event Date/Time   First MD Initiated Contact with Patient 08/08/21 1456     (approximate)   History   Abscess   HPI  Timothy Slocumb. is a 61 y.o. male presents to the ER for evaluation of left facial pain and swelling has been ongoing for several days.  Thought was a pimple and tried to squeeze it but has had worsening pain and swelling noted to have trouble worsening his jaw due to pain and swelling.  Also having trouble opening his left eye.  Denies any headaches no fevers or chills no recent antibiotics.  Does have history of diabetes which he says is only moderately well controlled.     Physical Exam   Triage Vital Signs: ED Triage Vitals  Enc Vitals Group     BP 08/08/21 1255 131/89     Pulse Rate 08/08/21 1255 96     Resp 08/08/21 1255 17     Temp 08/08/21 1255 99 F (37.2 C)     Temp Source 08/08/21 1255 Oral     SpO2 08/08/21 1255 96 %     Weight 08/08/21 1501 189 lb 6 oz (85.9 kg)     Height 08/08/21 1501 5\' 10"  (1.778 m)     Head Circumference --      Peak Flow --      Pain Score 08/08/21 1255 8     Pain Loc --      Pain Edu? --      Excl. in [place]? --     Most recent vital signs: Vitals:   08/08/21 1255 08/08/21 1615  BP: 131/89 (!) 162/99  Pulse: 96 89  Resp: 17 16  Temp: 99 F (37.2 C)   SpO2: 96% 92%     Constitutional: Alert  Eyes: Conjunctivae are normal.  Head: Atraumatic.  Large golf ball size area of fluctuance and tenderness in folliculitis of the left face overlying the TMJ anterior to the ear.  Does also have some surrounding erythema and edema. Nose: No congestion/rhinnorhea. Mouth/Throat: Mucous membranes are moist.   Neck: Painless ROM.  Cardiovascular:   Good peripheral circulation. Respiratory: Normal respiratory effort.  No retractions.  Gastrointestinal: Soft and nontender.  Musculoskeletal:  no deformity Neurologic:  MAE spontaneously. No gross focal neurologic  deficits are appreciated.  Skin:  Skin is warm, dry and intact. No rash noted. Psychiatric: Mood and affect are normal. Speech and behavior are normal.    ED Results / Procedures / Treatments   Labs (all labs ordered are listed, but only abnormal results are displayed) Labs Reviewed  BASIC METABOLIC PANEL - Abnormal; Notable for the following components:      Result Value   Sodium 131 (*)    Chloride 94 (*)    Glucose, Bld 347 (*)    All other components within normal limits  CBC WITH DIFFERENTIAL/PLATELET - Abnormal; Notable for the following components:   WBC 13.5 (*)    Neutro Abs 10.9 (*)    Abs Immature Granulocytes 0.09 (*)    All other components within normal limits  AEROBIC/ANAEROBIC CULTURE W GRAM STAIN (SURGICAL/DEEP WOUND)  RESP PANEL BY RT-PCR (FLU A&B, COVID) [id number]     EKG     RADIOLOGY Please see ED Course for my review and interpretation.  I personally reviewed all radiographic images ordered to evaluate for the above acute complaints and reviewed radiology reports and findings.  These findings were personally  discussed with the patient.  Please see medical record for radiology report.    PROCEDURES:  Critical Care performed: No  ..Incision and Drainage  Date/Time: 08/08/2021 4:57 PM Performed by: Willy Eddy, MD Authorized by: Willy Eddy, MD   Consent:    Consent obtained:  Verbal   Consent given by:  Patient   Risks discussed:  Bleeding, infection, incomplete drainage and pain   Alternatives discussed:  Alternative treatment, delayed treatment and observation Location:    Type:  Abscess   Location:  Head   Head location:  Face Pre-procedure details:    Skin preparation:  Povidone-iodine Anesthesia:    Anesthesia method:  Local infiltration and topical application   Topical anesthetic:  LET   Local anesthetic:  Lidocaine 1% w/o epi Procedure type:    Complexity:  Simple Procedure details:    Needle aspiration: yes      Needle size:  18 G   Incision types:  Stab incision and single straight   Incision depth:  Subcutaneous   Wound management:  Probed and deloculated and irrigated with saline   Drainage:  Purulent   Drainage amount:  Moderate   Wound treatment:  Wound left open Post-procedure details:    Procedure completion:  Tolerated well, no immediate complications   MEDICATIONS ORDERED IN ED: Medications  morphine (PF) 4 MG/ML injection 4 mg (has no administration in time range)  sodium chloride 0.9 % bolus 1,000 mL (1,000 mLs Intravenous New Bag/Given 08/08/21 1518)  insulin aspart (novoLOG) injection 6 Units (6 Units Subcutaneous Given 08/08/21 1523)  iohexol (OMNIPAQUE) 300 MG/ML solution 75 mL (75 mLs Intravenous Contrast Given 08/08/21 1530)  lidocaine (PF) (XYLOCAINE) 1 % injection 5 mL (5 mLs Intradermal Given by Other 08/08/21 1619)  lidocaine-EPINEPHrine-tetracaine (LET) topical gel (3 mLs Topical Given 08/08/21 1616)  ondansetron (ZOFRAN) injection 4 mg (4 mg Intravenous Given 08/08/21 1616)     IMPRESSION / MDM / ASSESSMENT AND PLAN / ED COURSE  I reviewed the triage vital signs and the nursing notes.                              Differential diagnosis includes, but is not limited to, abscess, cellulitis, mass, Ludewig's, parotitis, folliculitis  Presenting to the ER for evaluation of symptoms as described above low-grade temperature noted to be hyperglycemic on blood work as well as mild leukocytosis.  CT imaging ordered out of triage for above differential order IV fluids we will treat hyperglycemia with insulin.   Clinical Course as of 08/08/21 1659  Tue Aug 08, 2021  1556 CT imaging of the head by my independent review does not show any evidence of mass or subdural hematoma.  I do appreciate [PR]  1557  cellulitic changes on my review of the CT max face will await formal radiology report. [PR]  1658 I&D performed with purulent drainage.  Left open.  Given his hyperglycemia and extent  of cellulitic changes do feel that he would benefit from IV antibiotics and admission to the hospital I have consulted with hospitalist who agreed admit patient to their service.  I have ordered IV vanc. [PR]    Clinical Course User Index [PR] Willy Eddy, MD     FINAL CLINICAL IMPRESSION(S) / ED DIAGNOSES   Final diagnoses:  Facial abscess  Facial cellulitis  Hyperglycemia     Rx / DC Orders   ED Discharge Orders     None  Note:  This document was prepared using Dragon voice recognition software and may include unintentional dictation errors.    Merlyn Lot, MD 08/08/21 [phone]

## 2021-08-08 NOTE — Consult Note (Signed)
PHARMACY -  BRIEF ANTIBIOTIC NOTE   Pharmacy has received consult(s) for vancomycin from an ED provider.  The patient's profile has been reviewed for ht/wt/allergies/indication/available labs.    One time order(s) placed for vancomycin 2 g IV   Further antibiotics/pharmacy consults should be ordered by admitting physician if indicated.                       Thank you, Derrek Gu, PharmD 08/08/2021  5:22 PM

## 2021-08-08 NOTE — ED Notes (Signed)
1 set blood cultures drawn with IV insertion and sent to lab at this time.

## 2021-08-09 DIAGNOSIS — L0201 Cutaneous abscess of face: Secondary | ICD-10-CM | POA: Diagnosis not present

## 2021-08-09 LAB — GLUCOSE, CAPILLARY
Glucose-Capillary: 189 mg/dL — ABNORMAL HIGH (ref 70–99)
Glucose-Capillary: 191 mg/dL — ABNORMAL HIGH (ref 70–99)
Glucose-Capillary: 229 mg/dL — ABNORMAL HIGH (ref 70–99)
Glucose-Capillary: 271 mg/dL — ABNORMAL HIGH (ref 70–99)
Glucose-Capillary: 275 mg/dL — ABNORMAL HIGH (ref 70–99)
Glucose-Capillary: 276 mg/dL — ABNORMAL HIGH (ref 70–99)
Glucose-Capillary: 305 mg/dL — ABNORMAL HIGH (ref 70–99)
Glucose-Capillary: 309 mg/dL — ABNORMAL HIGH (ref 70–99)
Glucose-Capillary: 401 mg/dL — ABNORMAL HIGH (ref 70–99)
Glucose-Capillary: 466 mg/dL — ABNORMAL HIGH (ref 70–99)
Glucose-Capillary: 500 mg/dL — ABNORMAL HIGH (ref 70–99)
Glucose-Capillary: 501 mg/dL (ref 70–99)
Glucose-Capillary: 523 mg/dL (ref 70–99)
Glucose-Capillary: >600 mg/dL (ref 70–99)

## 2021-08-09 LAB — BASIC METABOLIC PANEL
Anion gap: 11 (ref 5–15)
Anion gap: 12 (ref 5–15)
Anion gap: 13 (ref 5–15)
Anion gap: 9 (ref 5–15)
Anion gap: 7 (ref 5–15)
BUN: 15 mg/dL (ref 6–20)
BUN: 23 mg/dL — ABNORMAL HIGH (ref 6–20)
BUN: 23 mg/dL — ABNORMAL HIGH (ref 6–20)
BUN: 22 mg/dL — ABNORMAL HIGH (ref 6–20)
BUN: 20 mg/dL (ref 6–20)
CO2: 24 mmol/L (ref 22–32)
CO2: 20 mmol/L — ABNORMAL LOW (ref 22–32)
CO2: 20 mmol/L — ABNORMAL LOW (ref 22–32)
CO2: 22 mmol/L (ref 22–32)
CO2: 24 mmol/L (ref 22–32)
Calcium: 8.4 mg/dL — ABNORMAL LOW (ref 8.9–10.3)
Calcium: 8.2 mg/dL — ABNORMAL LOW (ref 8.9–10.3)
Calcium: 8.1 mg/dL — ABNORMAL LOW (ref 8.9–10.3)
Calcium: 8.5 mg/dL — ABNORMAL LOW (ref 8.9–10.3)
Calcium: 8.2 mg/dL — ABNORMAL LOW (ref 8.9–10.3)
Chloride: 94 mmol/L — ABNORMAL LOW (ref 98–111)
Chloride: 93 mmol/L — ABNORMAL LOW (ref 98–111)
Chloride: 92 mmol/L — ABNORMAL LOW (ref 98–111)
Chloride: 99 mmol/L (ref 98–111)
Chloride: 99 mmol/L (ref 98–111)
Creatinine, Ser: 0.92 mg/dL (ref 0.61–1.24)
Creatinine, Ser: 1.18 mg/dL (ref 0.61–1.24)
Creatinine, Ser: 1.18 mg/dL (ref 0.61–1.24)
Creatinine, Ser: 0.89 mg/dL (ref 0.61–1.24)
Creatinine, Ser: 0.73 mg/dL (ref 0.61–1.24)
GFR, Estimated: >60 mL/min (ref >60)
GFR, Estimated: >60 mL/min (ref >60)
GFR, Estimated: >60 mL/min (ref >60)
GFR, Estimated: >60 mL/min (ref >60)
GFR, Estimated: >60 mL/min (ref >60)
Glucose, Bld: 397 mg/dL — ABNORMAL HIGH (ref 70–99)
Glucose, Bld: 738 mg/dL (ref 70–99)
Glucose, Bld: 733 mg/dL (ref 70–99)
Glucose, Bld: 325 mg/dL — ABNORMAL HIGH (ref 70–99)
Glucose, Bld: 260 mg/dL — ABNORMAL HIGH (ref 70–99)
Potassium: 4.9 mmol/L (ref 3.5–5.1)
Potassium: 4.9 mmol/L (ref 3.5–5.1)
Potassium: 4.8 mmol/L (ref 3.5–5.1)
Potassium: 4.2 mmol/L (ref 3.5–5.1)
Potassium: 3.9 mmol/L (ref 3.5–5.1)
Sodium: 129 mmol/L — ABNORMAL LOW (ref 135–145)
Sodium: 125 mmol/L — ABNORMAL LOW (ref 135–145)
Sodium: 125 mmol/L — ABNORMAL LOW (ref 135–145)
Sodium: 130 mmol/L — ABNORMAL LOW (ref 135–145)
Sodium: 130 mmol/L — ABNORMAL LOW (ref 135–145)

## 2021-08-09 LAB — CBC
HCT: 38.9 % — ABNORMAL LOW (ref 39.0–52.0)
Hemoglobin: 13.1 g/dL (ref 13.0–17.0)
MCH: 31.3 pg (ref 26.0–34.0)
MCHC: 33.7 g/dL (ref 30.0–36.0)
MCV: 93.1 fL (ref 80.0–100.0)
Platelets: 272 10*3/uL (ref 150–400)
RBC: 4.18 MIL/uL — ABNORMAL LOW (ref 4.22–5.81)
RDW: 12.9 % (ref 11.5–15.5)
WBC: 11 10*3/uL — ABNORMAL HIGH (ref 4.0–10.5)
nRBC: 0 % (ref 0.0–0.2)

## 2021-08-09 LAB — GLUCOSE, RANDOM: Glucose, Bld: 505 mg/dL (ref 70–99)

## 2021-08-09 LAB — SODIUM, URINE, RANDOM: Sodium, Ur: 30 mmol/L

## 2021-08-09 LAB — HEMOGLOBIN A1C
Hgb A1c MFr Bld: 11.9 % — ABNORMAL HIGH (ref 4.8–5.6)
Mean Plasma Glucose: 295 mg/dL

## 2021-08-09 LAB — OSMOLALITY: Osmolality: 313 mOsm/kg — ABNORMAL HIGH (ref 275–295)

## 2021-08-09 LAB — HIV ANTIBODY (ROUTINE TESTING W REFLEX): HIV Screen 4th Generation wRfx: NONREACTIVE

## 2021-08-09 MED ORDER — POTASSIUM CHLORIDE 10 MEQ/100ML IV SOLN
10.0000 meq | INTRAVENOUS | Status: AC
  Administered 2021-08-09 (×4): 10 meq via INTRAVENOUS
  Filled 2021-08-09 (×4): qty 100

## 2021-08-09 MED ORDER — LACTATED RINGERS IV BOLUS
20.0000 mL/kg | Freq: Once | INTRAVENOUS | Status: AC
  Administered 2021-08-09: 1718 mL via INTRAVENOUS

## 2021-08-09 MED ORDER — CHLORHEXIDINE GLUCONATE CLOTH 2 % EX PADS
6.0000 | MEDICATED_PAD | Freq: Every day | CUTANEOUS | Status: DC
  Administered 2021-08-09: 6 via TOPICAL

## 2021-08-09 MED ORDER — SODIUM CHLORIDE 0.9 % IV BOLUS
1000.0000 mL | Freq: Once | INTRAVENOUS | Status: AC
  Administered 2021-08-09: 1000 mL via INTRAVENOUS

## 2021-08-09 MED ORDER — INSULIN REGULAR(HUMAN) IN NACL 100-0.9 UT/100ML-% IV SOLN
INTRAVENOUS | Status: DC
  Administered 2021-08-09: 18 [IU]/h via INTRAVENOUS
  Administered 2021-08-09: 10.5 [IU]/h via INTRAVENOUS
  Filled 2021-08-09 (×2): qty 100

## 2021-08-09 MED ORDER — GABAPENTIN 400 MG PO CAPS
400.0000 mg | ORAL_CAPSULE | Freq: Three times a day (TID) | ORAL | Status: DC
  Administered 2021-08-09 – 2021-08-12 (×8): 400 mg via ORAL
  Filled 2021-08-09 (×8): qty 1

## 2021-08-09 MED ORDER — DEXTROSE IN LACTATED RINGERS 5 % IV SOLN: INTRAVENOUS | Status: DC

## 2021-08-09 MED ORDER — VENLAFAXINE HCL ER 75 MG PO CP24
75.0000 mg | ORAL_CAPSULE | Freq: Every day | ORAL | Status: DC
  Administered 2021-08-09 – 2021-08-12 (×4): 75 mg via ORAL
  Filled 2021-08-09 (×4): qty 1

## 2021-08-09 MED ORDER — DEXTROSE 50 % IV SOLN: 0.0000 mL | INTRAVENOUS | Status: DC | PRN

## 2021-08-09 MED ORDER — INSULIN ASPART 100 UNIT/ML IJ SOLN
3.0000 [IU] | Freq: Three times a day (TID) | INTRAMUSCULAR | Status: DC
  Administered 2021-08-09: 3 [IU] via SUBCUTANEOUS
  Filled 2021-08-09: qty 1

## 2021-08-09 MED ORDER — SODIUM CHLORIDE 0.9 % IV SOLN: INTRAVENOUS | Status: DC

## 2021-08-09 MED ORDER — NICOTINE 21 MG/24HR TD PT24
21.0000 mg | MEDICATED_PATCH | Freq: Every day | TRANSDERMAL | Status: DC
Start: 2021-08-09 — End: 2021-08-12
  Administered 2021-08-09 – 2021-08-11 (×3): 21 mg via TRANSDERMAL
  Filled 2021-08-09 (×4): qty 1

## 2021-08-09 MED ORDER — INSULIN GLARGINE-YFGN 100 UNIT/ML ~~LOC~~ SOLN
10.0000 [IU] | Freq: Once | SUBCUTANEOUS | Status: AC
  Administered 2021-08-09: 10 [IU] via SUBCUTANEOUS
  Filled 2021-08-09: qty 0.1

## 2021-08-09 MED ORDER — ARIPIPRAZOLE 10 MG PO TABS
10.0000 mg | ORAL_TABLET | Freq: Every day | ORAL | Status: DC
  Administered 2021-08-09 – 2021-08-11 (×3): 10 mg via ORAL
  Filled 2021-08-09 (×4): qty 1

## 2021-08-09 MED ORDER — LACTATED RINGERS IV SOLN: INTRAVENOUS | Status: DC

## 2021-08-09 NOTE — Progress Notes (Signed)
08/09/2021 5:21 PM  Timothy [name] [id number]  Feeling better this afternoon, swelling and pain decreased.  He was placed in ICU as blood sugars elevated and needed insulin drip.      Temp:  [97.6 F (36.4 C)-99.5 F (37.5 C)] 97.8 F (36.6 C) (02/22 1708) Pulse Rate:  [70-96] 70 (02/22 1500) Resp:  [18] 18 (02/22 1500) BP: (130-162)/(78-91) 130/78 (02/22 1700) SpO2:  [95 %-96 %] 95 % (02/22 1500) Weight:  [93.1 kg] 93.1 kg (02/22 1709),     Intake/Output Summary (Last 24 hours) at 08/09/2021 1721 Last data filed at 08/09/2021 1600 Gross per 24 hour  Intake 2504.37 ml  Output 1175 ml  Net 1329.37 ml    Results for orders placed or performed during the hospital encounter of 08/08/21 (from the past 24 hour(s))  MRSA Next Gen by PCR, Nasal     Status: None   Collection Time: 08/08/21  7:51 PM   Specimen: Nasal Mucosa; Nasal Swab  Result Value Ref Range   MRSA by PCR Next Gen NOT DETECTED NOT DETECTED  Glucose, capillary     Status: Abnormal   Collection Time: 08/08/21  9:31 PM  Result Value Ref Range   Glucose-Capillary 246 (H) 70 - 99 mg/dL  HIV Antibody (routine testing w rflx)     Status: None   Collection Time: 08/08/21  9:32 PM  Result Value Ref Range   HIV Screen 4th Generation wRfx Non Reactive Non Reactive  Osmolality     Status: None   Collection Time: 08/08/21  9:32 PM  Result Value Ref Range   Osmolality 284 275 - 295 mOsm/kg  Basic metabolic panel     Status: Abnormal   Collection Time: 08/09/21  4:16 AM  Result Value Ref Range   Sodium 129 (L) 135 - 145 mmol/L   Potassium 4.9 3.5 - 5.1 mmol/L   Chloride 94 (L) 98 - 111 mmol/L   CO2 24 22 - 32 mmol/L   Glucose, Bld 397 (H) 70 - 99 mg/dL   BUN 15 6 - 20 mg/dL   Creatinine, Ser 5.80 0.61 - 1.24 mg/dL   Calcium 8.4 (L) 8.9 - 10.3 mg/dL   GFR, Estimated >99 >83 mL/min   Anion gap 11 5 - 15  CBC     Status: Abnormal   Collection Time: 08/09/21  4:16 AM  Result Value Ref Range   WBC 11.0 (H) 4.0 - 10.5  K/uL   RBC 4.18 (L) 4.22 - 5.81 MIL/uL   Hemoglobin 13.1 13.0 - 17.0 g/dL   HCT 38.2 (L) 50.5 - 39.7 %   MCV 93.1 80.0 - 100.0 fL   MCH 31.3 26.0 - 34.0 pg   MCHC 33.7 30.0 - 36.0 g/dL   RDW 67.3 41.9 - 37.9 %   Platelets 272 150 - 400 K/uL   nRBC 0.0 0.0 - 0.2 %  Sodium, urine, random     Status: None   Collection Time: 08/09/21  4:27 AM  Result Value Ref Range   Sodium, Ur 30 mmol/L  Glucose, capillary     Status: Abnormal   Collection Time: 08/09/21  8:48 AM  Result Value Ref Range   Glucose-Capillary 500 (H) 70 - 99 mg/dL  Glucose, random     Status: Abnormal   Collection Time: 08/09/21  9:24 AM  Result Value Ref Range   Glucose, Bld 505 (HH) 70 - 99 mg/dL  Glucose, capillary     Status: Abnormal   Collection Time: 08/09/21 11:40  AM  Result Value Ref Range   Glucose-Capillary >600 (HH) 70 - 99 mg/dL  Basic metabolic panel     Status: Abnormal   Collection Time: 08/09/21 12:13 PM  Result Value Ref Range   Sodium 125 (L) 135 - 145 mmol/L   Potassium 4.9 3.5 - 5.1 mmol/L   Chloride 93 (L) 98 - 111 mmol/L   CO2 20 (L) 22 - 32 mmol/L   Glucose, Bld 738 (HH) 70 - 99 mg/dL   BUN 23 (H) 6 - 20 mg/dL   Creatinine, Ser 9.16 0.61 - 1.24 mg/dL   Calcium 8.2 (L) 8.9 - 10.3 mg/dL   GFR, Estimated >38 >46 mL/min   Anion gap 12 5 - 15  Basic metabolic panel     Status: Abnormal   Collection Time: 08/09/21 12:46 PM  Result Value Ref Range   Sodium 125 (L) 135 - 145 mmol/L   Potassium 4.8 3.5 - 5.1 mmol/L   Chloride 92 (L) 98 - 111 mmol/L   CO2 20 (L) 22 - 32 mmol/L   Glucose, Bld 733 (HH) 70 - 99 mg/dL   BUN 23 (H) 6 - 20 mg/dL   Creatinine, Ser 6.59 0.61 - 1.24 mg/dL   Calcium 8.1 (L) 8.9 - 10.3 mg/dL   GFR, Estimated >93 >57 mL/min   Anion gap 13 5 - 15  Osmolality     Status: Abnormal   Collection Time: 08/09/21 12:46 PM  Result Value Ref Range   Osmolality 313 (H) 275 - 295 mOsm/kg  Glucose, capillary     Status: Abnormal   Collection Time: 08/09/21  2:25 PM  Result  Value Ref Range   Glucose-Capillary 523 (HH) 70 - 99 mg/dL  Glucose, capillary     Status: Abnormal   Collection Time: 08/09/21  3:04 PM  Result Value Ref Range   Glucose-Capillary 501 (HH) 70 - 99 mg/dL   Comment 1 Notify RN   Glucose, capillary     Status: Abnormal   Collection Time: 08/09/21  3:32 PM  Result Value Ref Range   Glucose-Capillary 466 (H) 70 - 99 mg/dL  Glucose, capillary     Status: Abnormal   Collection Time: 08/09/21  4:14 PM  Result Value Ref Range   Glucose-Capillary 401 (H) 70 - 99 mg/dL  Glucose, capillary     Status: Abnormal   Collection Time: 08/09/21  4:47 PM  Result Value Ref Range   Glucose-Capillary 305 (H) 70 - 99 mg/dL    SUBJECTIVE:  Feeling better overall.  OBJECTIVE:  periorbital swelling has decreased markedly, as has the left preauricular swelling.  There is still purulence draining centrally from the previous I & D site, palpation there is no fluctuance that I can detect, with firm palpation, pain is less, and minimal pus expressed from the wound.  IMPRESSION:  Facial cellulitis/abscess-improved, culture pending likely staph.  Would continue vanc and start to taper down decadron which should help blood glucose  PLAN:  Note/message sent to hospitalist.  Will follow  Davina Poke 08/09/2021, 5:21 PM

## 2021-08-09 NOTE — TOC Initial Note (Addendum)
Transition of Care (TOC) - Initial/Assessment Note    Patient Details  Name: Timothy [name]. MRN: [id number] Date of Birth: May 13, 1961  Transition of Care [facility]) CM/SW Contact:    Conception Oms, RN Phone Number: 08/09/2021, 9:03 AM  Clinical Narrative:                  Transition of Care [facility]) Screening Note   Patient Details  Name: Timothy [name]. Date of Birth: February 27, 1961   Transition of Care [facility]) CM/SW Contact:    Conception Oms, RN Phone Number: 08/09/2021, 9:03 AM  The patient is independent, Has a PCP and has Medicaid to cover cost, [facility] [place] [facility] for [facility]  Transition of Care Department [facility]) has reviewed patient and no TOC needs have been identified at this time. We will continue to monitor patient advancement through interdisciplinary progression rounds. If new patient transition needs arise, please place a TOC consult.          Patient Goals and CMS Choice        Expected Discharge Plan and Services                                                Prior Living Arrangements/Services                       Activities of Daily Living Home Assistive Devices/Equipment: Cane (specify quad or straight) ADL Screening (condition at time of admission) Patient's cognitive ability adequate to safely complete daily activities?: Yes Is the patient deaf or have difficulty hearing?: No Does the patient have difficulty seeing, even when wearing glasses/contacts?: No Does the patient have difficulty concentrating, remembering, or making decisions?: No Patient able to express need for assistance with ADLs?: Yes Does the patient have difficulty dressing or bathing?: No Independently performs ADLs?: Yes (appropriate for developmental age) Does the patient have difficulty walking or climbing stairs?: Yes Weakness of Legs: None Weakness of Arms/Hands: None  Permission  Sought/Granted                  Emotional Assessment              Admission diagnosis:  Facial abscess [L02.01] Hyperglycemia [R73.9] Facial cellulitis [L03.211] Patient Active Problem List   Diagnosis Date Noted   Facial abscess 08/08/2021   Facial cellulitis 08/08/2021   Uncontrolled type 2 diabetes mellitus with hyperglycemia ([place]) 08/08/2021   COPD (chronic obstructive pulmonary disease) ([place]) 08/08/2021   Atherosclerosis of native arteries of the extremities with ulceration ([place]) 04/18/2021   DJD (degenerative joint disease), lumbosacral 04/18/2021   Leg pain 01/23/2021   Acute deep vein thrombosis (DVT) of left lower extremity ([place]) 01/23/2021   Atherosclerosis of native arteries of extremity with intermittent claudication ([place]) 01/22/2021   MDD (major depressive disorder), recurrent severe, without psychosis ([place]) 12/19/2020   Severe episode of recurrent major depressive disorder, with psychotic features ([place])    COPD with acute exacerbation ([place]) 12/14/2020   Hx of sleep apnea 12/14/2020   Empyema lung ([place]) 08/15/2020   Type 2 diabetes mellitus with hyperglycemia ([place]) 08/11/2020   Respiratory failure with hypoxia ([place]) 08/11/2020   Hyponatremia 08/11/2020   Chronic diastolic CHF (congestive heart failure) ([place]) 08/11/2020   Anemia 08/11/2020   Abdominal  pain 08/11/2020   Empyema ([place]) 08/10/2020   Osteomyelitis ([place]) 03/15/2020   Open wound of foot 01/20/2020   Elevated lipids 01/20/2020   Personal history of colonic polyps    Shortness of breath 11/04/2019   Encounter to establish care 10/15/2019   Essential hypertension 10/15/2019   Controlled diabetes mellitus type 2 with complications ([place]) [id number]   Smoking 10/15/2019   Peripheral neuropathy 10/15/2019   Cramp, abdominal 10/15/2019   History of hepatitis 10/15/2019   History of gastroesophageal reflux (GERD) 10/15/2019   Type 2 diabetes mellitus with diabetic neuropathy, unspecified ([place])  10/15/2019   Post-traumatic stress disorder, unspecified 07/14/2019   Antisocial personality disorder ([place]) 07/14/2019   PCP:  [facility]:   [facility] [address] [place] [address] Phone: [phone] Fax: [phone]  [facility] [place] [place] [facility] [place] [address] [place] [address] Phone: [phone] Fax: [phone]     Social Determinants of Health (SDOH) Interventions    Readmission Risk Interventions No flowsheet data found.

## 2021-08-09 NOTE — Consult Note (Signed)
Timothy [name], Timothy [name] [id number] 08/17/1960 Timothy Reasons, MD  Reason for Consult: Facial cellulitis possible abscess  HPI: Mr. Poarch is a 61 year old male admitted to the hospital yesterday evening for left-sided facial cellulitis/abscess.  He tells me that he thought he had an infected pimple over the left temporal region he made multiple attempts to try to open this area just progressively got worse.  He presented to the emergency room with significant left-sided facial swelling involving the upper and lower eyelids as well.  In the emergency room he had an incision and drainage by Dr. Quentin [name] where cultures were taken.  The Gram stain results showed gram-positive cocci with white blood cells.  He was admitted and started on IV vancomycin and Decadron.  This morning he feels better the swelling around the left eye significantly improved he is able to open his eye without difficulty.  Only complains of some pain over the area as well as mild trismus.  Allergies:  Allergies  Allergen Reactions   Bee Venom Anaphylaxis   Cheese Anaphylaxis, Swelling and Other (See Comments)    Blue Cheese only   Coffea Arabica Shortness Of Breath   Coffee Flavor Shortness Of Breath    ROS: Review of systems normal other than 12 systems except per HPI.  PMH:  Past Medical History:  Diagnosis Date   Diabetes mellitus without complication ([place])    Hypercholesteremia    Hypertension     FH:  Family History  Family history unknown: Yes    SH:  Social History   Socioeconomic History   Marital status: Single    Spouse name: Not on file   Number of children: 0   Years of education: Not on file   Highest education level: Not on file  Occupational History   Occupation: Unemployed  Tobacco Use   Smoking status: Every Day    Packs/day: 2.00    Years: 41.00    Pack years: 82.00    Types: Cigarettes   Smokeless tobacco: Never   Tobacco comments:    3-10cigs daily 05/24/2021  Vaping Use   Vaping Use:  Never used  Substance and Sexual Activity   Alcohol use: Yes    Alcohol/week: 6.0 standard drinks    Types: 6 Cans of beer per week    Comment: occasionally    Drug use: Not Currently   Sexual activity: Not Currently  Other Topics Concern   Not on file  Social History Narrative   Not on file   Social Determinants of Health   Financial Resource Strain: Not on file  Food Insecurity: Not on file  Transportation Needs: Not on file  Physical Activity: Not on file  Stress: Not on file  Social Connections: Not on file  Intimate Partner Violence: Not on file    PSH:  Past Surgical History:  Procedure Laterality Date   AMPUTATION Left 03/22/2020   Procedure: AMPUTATION RAY-1st Ray Left Foot;  Surgeon: [name], [name];  Location: [facility] ORS;  Service: Podiatry;  Laterality: Left;   AMPUTATION TOE Left 03/16/2020   Procedure: AMPUTATION GREAT TOE;  Surgeon: [name], [name];  Location: [facility] ORS;  Service: Podiatry;  Laterality: Left;   COLONOSCOPY WITH PROPOFOL N/A 12/22/2019   Procedure: COLONOSCOPY WITH PROPOFOL;  Surgeon: [name], MD;  Location[facility];  Service: Endoscopy;  Laterality: N/A;   LOWER EXTREMITY ANGIOGRAPHY Left 03/18/2020   Procedure: Lower Extremity Angiography;  Surgeon: [name], MD;  Location: [facility] CV LAB;  Service: Cardiovascular;  Laterality: Left;   TONSILLECTOMY     VIDEO ASSISTED THORACOSCOPY (VATS)/DECORTICATION Left 08/15/2020   Procedure: VIDEO ASSISTED THORACOSCOPY (VATS)/DECORTICATION;  Surgeon: [name], MD;  Location: [facility];  Service: Thoracic;  Laterality: Left;   WISDOM TOOTH EXTRACTION      Physical  Exam: Patient is awake and alert in the bed with head elevated at 45 degrees.  There is a dressing overlying the left face.  This dressing was removed.  There is mild swelling around the upper left eyelid however there is no impediment to opening or closing the eye.  He believes that the swelling is significantly  decreased in this area.  Overlying the soft tissue swelling in the preauricular region there is significant swelling with mild tenderness this area feels firm and I cannot detect any fluctuance over the area.  There continues to be some drainage from what appears to be the area where I&D was performed in the emergency room yesterday.  Microbiology reviewed showed gram-positive cocci  A/P: Left facial cellulitis/abscess-I reviewed the CT scan from yesterday appeared to be more of cellulitis and phlegmon as opposed to abscess.  Dr. Quentin [name] was able to obtain some purulence in the emergency room which was sent for culture so far this is growing gram-positive cocci consistent with staph infection.  Clinically the patient is feeling better the swelling is improved and the wound continues to drain.  He has only had 12 hours of IV antibiotic, and with this improved we will continue with current treatment plan and guide antibiotic therapy based on culture results.  Patient agrees with this plan, we will follow along with his progress.  Would recommend continued dressing care with iodoform dressing as previously done.   Roena Malady 08/09/2021 7:48 AM

## 2021-08-09 NOTE — Progress Notes (Signed)
1500 patient has normal saline going as a bolus stopped and started LR per orders patient alert 4 able to make all needs known started IV for insulin gtt and potassium to be started per orders

## 2021-08-09 NOTE — Progress Notes (Signed)
PROGRESS NOTE    Timothy [name].  CWC:[id number] DOB: 08/04/1960 DOA: 08/08/2021 PCP: Nira Retort     Brief Narrative:   Timothy Conkin. is a 61 y.o. male with medical history significant of DMT2, COPD, HTN who presents with left-sided facial pain and swelling that started 3 to 4 days ago.   He thought initially had a small pimple and he tried to squeeze on the left side of his face, denies injury or bug bite  Subjective:  Reports facial pain Blood glucose elevated Reports craving cigarrette Wants his home meds resumed   Assessment & Plan:  Principal Problem:   Facial abscess Active Problems:   Essential hypertension   Hyponatremia   Facial cellulitis   Uncontrolled type 2 diabetes mellitus with hyperglycemia (HCC)   COPD (chronic obstructive pulmonary disease) (HCC)    Assessment and Plan:  * Facial abscess/cellulitis Seen by  ENT , no plan for  I&D  currently Continue iv abx ,  iv decadrone, F/u on Culture obtained in ER  Pain control    Uncontrolled type 2 diabetes mellitus with hyperglycemia (HCC) Hold metformin for 48 hours after having IV contrast for CT scan. Blood glucose in  the 700s despite sub Q insulin Elevated blood glucose likely due to stress and steroid  Start insulin drip , IV fluids  Diabetes education, and report does not monitor blood glucose at home. Pt needs a glucometer for home as his got lost in move he states Check HgbA1c.  Continue statin    Hyponatremia- (present on admission) Likely pseudohyponatremia in the setting of hyperglycemia Currently on insulin drip and IV hydration Monitor BMP   Essential hypertension- (present on admission) Continue with norvasc, clonidine, metoprolol. Monitor BP   COPD (chronic obstructive pulmonary disease) (HCC) Continue trelegy elliptica daily. duoneb q 6 hour as needed.  Incentive spirometer every 2 hours while awake.  No wheezing, no cough, he is on room  air  Smoker Advised to quit smoking. Given nicotine patch to prevent withdrawls.    Psych: stable on home meds     I have Reviewed nursing notes, Vitals, pain scores, I/o's, Lab results and  imaging results since pt's last encounter, details please see discussion above  I ordered the following labs:  Unresulted Labs (From admission, onward)     Start     Ordered   08/10/21 0500  CBC  Tomorrow morning,   R       Question:  Specimen collection method  Answer:  Lab=Lab collect   08/09/21 1646   08/10/21 0500  Magnesium  Tomorrow morning,   R       Question:  Specimen collection method  Answer:  Lab=Lab collect   08/09/21 1646   08/09/21 1256  Basic metabolic panel  (Hyperglycemic Hyperosmolar State (HHS))  STAT Now then every 4 hours ,   TIMED      08/09/21 1256   08/09/21 1256  Hemoglobin A1c  (Hyperglycemic Hyperosmolar State (HHS))  Once,   R       Comments: To assess prior glycemic control.    08/09/21 1256   08/08/21 2039  Hemoglobin A1c  Once,   R       Comments: To assess prior glycemic control    08/08/21 2038             DVT prophylaxis: enoxaparin (LOVENOX) injection 40 mg Start: 08/08/21 2200   Code Status:   Code Status: Full Code  Family Communication: None  at bedside Disposition:   Status is: Inpatient  Dispo: The patient is from: Home              Anticipated d/c is to: Home              Anticipated d/c date is: Greater than 48 hours  Antimicrobials:   Anti-infectives (From admission, onward)    Start     Dose/Rate Route Frequency Ordered Stop   08/09/21 0600  vancomycin (VANCOCIN) IVPB 1000 mg/200 mL premix        1,000 mg 200 mL/hr over 60 Minutes Intravenous Every 12 hours 08/08/21 1830     08/08/21 1730  vancomycin (VANCOREADY) IVPB 2000 mg/400 mL        2,000 mg 200 mL/hr over 120 Minutes Intravenous  Once 08/08/21 1724 08/08/21 2000           Objective: Vitals:   08/09/21 0254 08/09/21 0831 08/09/21 1124 08/09/21 1500  BP:  (!) 162/90 (!) 153/87 (!) 158/91 140/86  Pulse: 84 72 75 70  Resp: 18 18 18 18   Temp: 97.7 F (36.5 C) 98.6 F (37 C) 97.6 F (36.4 C)   TempSrc:      SpO2: 96% 95% 96% 95%  Weight:      Height:        Intake/Output Summary (Last 24 hours) at 08/09/2021 1646 Last data filed at 08/09/2021 1600 Gross per 24 hour  Intake 2504.37 ml  Output 1175 ml  Net 1329.37 ml   Filed Weights   08/08/21 1501  Weight: 85.9 kg    Examination:  General exam: alert, awake, communicative,calm, NAD Respiratory system: Clear to auscultation. Respiratory effort normal. Cardiovascular system:  RRR.  Gastrointestinal system: Abdomen is nondistended, soft and nontender.  Normal bowel sounds heard. Central nervous system: Alert and oriented. No focal neurological deficits. Extremities:  no edema Skin: No rashes, lesions or ulcers Psychiatry: Judgement and insight appear normal. Mood & affect appropriate.     Data Reviewed: I have personally reviewed  labs and visualized  imaging studies since the last encounter and formulate the plan        Scheduled Meds:  amLODipine  5 mg Oral Daily   ARIPiprazole  10 mg Oral QHS   Chlorhexidine Gluconate Cloth  6 each Topical Daily   cloNIDine  0.1 mg Oral QHS   dexamethasone (DECADRON) injection  10 mg Intravenous Q8H   enoxaparin (LOVENOX) injection  40 mg Subcutaneous Q24H   fluticasone furoate-vilanterol  1 puff Inhalation Daily   And   umeclidinium bromide  1 puff Inhalation Daily   gabapentin  400 mg Oral TID   metoprolol succinate  25 mg Oral Daily   nicotine  21 mg Transdermal Daily   pantoprazole  40 mg Oral Daily   rosuvastatin  5 mg Oral Daily   venlafaxine XR  75 mg Oral Q breakfast   Continuous Infusions:  sodium chloride 75 mL/hr at 08/09/21 1600   dextrose 5% lactated ringers     insulin 18 Units/hr (08/09/21 1600)   lactated ringers     potassium chloride 10 mEq (08/09/21 1605)   vancomycin Stopped (08/09/21 0709)     LOS: 1  day    08/11/21, MD PhD FACP [facility]  Available via Epic secure chat 7am-7pm for nonurgent issues Please page for urgent issues To page the attending provider between 7A-7P or the covering provider during after hours 7P-7A, please log into the web site [email or web address] and access using  universal [place] password for that web site. If you do not have the password, please call the hospital operator.    08/09/2021, 4:46 PM

## 2021-08-09 NOTE — Progress Notes (Signed)
Inpatient Diabetes Program Recommendations  AACE/ADA: New Consensus Statement on Inpatient Glycemic Control (2015)  Target Ranges:  Prepandial:   less than 140 mg/dL      Peak postprandial:   less than 180 mg/dL (1-2 hours)      Critically ill patients:  140 - 180 mg/dL   Lab Results  Component Value Date   GLUCAP >600 (HH) 08/09/2021   HGBA1C 8.3 (H) 09/21/2020    Review of Glycemic Control  Diabetes history: DM2 Outpatient Diabetes medications: glipizide 10 mg BID, metformin 1000 mg BID Current orders for Inpatient glycemic control: IV insulin per [employer] for HHS  HgbA1C pending Decadron 10 mg Q8H x 3 doses  Inpatient Diabetes Program Recommendations:    Agree with orders.  Diabetes Coordinator to see pt when appropriate, to discuss his gluocse control at home  Will need prescription for glucose monitoring kit at discharge.   Continue to follow trends.  Thank you. Lorenda Peck, RD, LDN, CDE Inpatient Diabetes Coordinator [phone]

## 2021-08-10 DIAGNOSIS — L0201 Cutaneous abscess of face: Secondary | ICD-10-CM | POA: Diagnosis not present

## 2021-08-10 LAB — HEMOGLOBIN A1C
Hgb A1c MFr Bld: 11.9 % — ABNORMAL HIGH (ref 4.8–5.6)
Mean Plasma Glucose: 295 mg/dL

## 2021-08-10 LAB — CBC
HCT: 33.5 % — ABNORMAL LOW (ref 39.0–52.0)
Hemoglobin: 11.6 g/dL — ABNORMAL LOW (ref 13.0–17.0)
MCH: 31.9 pg (ref 26.0–34.0)
MCHC: 34.6 g/dL (ref 30.0–36.0)
MCV: 92 fL (ref 80.0–100.0)
Platelets: 288 10*3/uL (ref 150–400)
RBC: 3.64 MIL/uL — ABNORMAL LOW (ref 4.22–5.81)
RDW: 12.6 % (ref 11.5–15.5)
WBC: 11.1 10*3/uL — ABNORMAL HIGH (ref 4.0–10.5)
nRBC: 0 % (ref 0.0–0.2)

## 2021-08-10 LAB — MAGNESIUM: Magnesium: 2 mg/dL (ref 1.7–2.4)

## 2021-08-10 LAB — GLUCOSE, CAPILLARY
Glucose-Capillary: 127 mg/dL — ABNORMAL HIGH (ref 70–99)
Glucose-Capillary: 132 mg/dL — ABNORMAL HIGH (ref 70–99)
Glucose-Capillary: 134 mg/dL — ABNORMAL HIGH (ref 70–99)
Glucose-Capillary: 135 mg/dL — ABNORMAL HIGH (ref 70–99)
Glucose-Capillary: 141 mg/dL — ABNORMAL HIGH (ref 70–99)
Glucose-Capillary: 142 mg/dL — ABNORMAL HIGH (ref 70–99)
Glucose-Capillary: 162 mg/dL — ABNORMAL HIGH (ref 70–99)
Glucose-Capillary: 164 mg/dL — ABNORMAL HIGH (ref 70–99)
Glucose-Capillary: 170 mg/dL — ABNORMAL HIGH (ref 70–99)
Glucose-Capillary: 217 mg/dL — ABNORMAL HIGH (ref 70–99)
Glucose-Capillary: 290 mg/dL — ABNORMAL HIGH (ref 70–99)

## 2021-08-10 LAB — BASIC METABOLIC PANEL
Anion gap: 7 (ref 5–15)
Anion gap: 9 (ref 5–15)
BUN: 18 mg/dL (ref 6–20)
BUN: 18 mg/dL (ref 6–20)
CO2: 25 mmol/L (ref 22–32)
CO2: 27 mmol/L (ref 22–32)
Calcium: 8.1 mg/dL — ABNORMAL LOW (ref 8.9–10.3)
Calcium: 8.3 mg/dL — ABNORMAL LOW (ref 8.9–10.3)
Chloride: 97 mmol/L — ABNORMAL LOW (ref 98–111)
Chloride: 99 mmol/L (ref 98–111)
Creatinine, Ser: 0.68 mg/dL (ref 0.61–1.24)
Creatinine, Ser: 0.72 mg/dL (ref 0.61–1.24)
GFR, Estimated: >60 mL/min (ref >60)
GFR, Estimated: >60 mL/min (ref >60)
Glucose, Bld: 144 mg/dL — ABNORMAL HIGH (ref 70–99)
Glucose, Bld: 176 mg/dL — ABNORMAL HIGH (ref 70–99)
Potassium: 3.9 mmol/L (ref 3.5–5.1)
Potassium: 4 mmol/L (ref 3.5–5.1)
Sodium: 131 mmol/L — ABNORMAL LOW (ref 135–145)
Sodium: 133 mmol/L — ABNORMAL LOW (ref 135–145)

## 2021-08-10 MED ORDER — INSULIN GLARGINE-YFGN 100 UNIT/ML ~~LOC~~ SOLN
14.0000 [IU] | Freq: Every day | SUBCUTANEOUS | Status: DC
Start: 2021-08-10 — End: 2021-08-11
  Administered 2021-08-10 – 2021-08-11 (×2): 14 [IU] via SUBCUTANEOUS
  Filled 2021-08-10 (×2): qty 0.14

## 2021-08-10 MED ORDER — INSULIN ASPART 100 UNIT/ML IJ SOLN
0.0000 [IU] | Freq: Three times a day (TID) | INTRAMUSCULAR | Status: DC
  Administered 2021-08-10: 3 [IU] via SUBCUTANEOUS
  Administered 2021-08-10: 2 [IU] via SUBCUTANEOUS
  Administered 2021-08-11: 5 [IU] via SUBCUTANEOUS
  Filled 2021-08-10 (×3): qty 1

## 2021-08-10 NOTE — Progress Notes (Signed)
Met w/ pt at bedside to review current CBGs, transition to SQ Insulin, current A1c, etc.    Pt told me he saw his PCP in January and PCP started him back on Metformin and Glipizide meds, however, pt stated he could NOT afford the $4 co-pays for Medicaid and did not start these 2 diabetes meds until Feb 13th--has only been taking the meds steadily for about 10 days since they were re-prescribed.  Is open to taking insulin if needed but concerned about the cost--I did remind pt that all insulins covered by Medicaid will be $4 per Rx.  Needs CBG meter for home as well.    MD- Please provide pt with Rx for CBG meter for home and if you choose to send pt home on Insulin:  Covered by [place] Novolog or Humalog insulin pens  And   Lantus or Levemir insulin pens       --Will follow patient during hospitalization--  Wyn Quaker RN, MSN, CDE Diabetes Coordinator Inpatient Glycemic Control Team Team Pager: [phone] (8a-5p)

## 2021-08-10 NOTE — Hospital Course (Addendum)
Timothy [name]. is a 61 y.o. male with medical history significant of DMT2, COPD, HTN who presents with left-sided facial pain and swelling that started 3 to 4 days ago.  He thought initially had a small pimple and he tried to squeeze on the left side of his face, denies injury or bug bite.  Admitted for IV antibiotics with ENT consulted for facial cellulitis with abscess.

## 2021-08-10 NOTE — Progress Notes (Signed)
08/10/2021 9:11 AM  Timothy [name] [id number]  Feeling less pain today, swelling improved.      Temp:  [97.6 F (36.4 C)-98 F (36.7 C)] 98 F (36.7 C) (02/23 0747) Pulse Rate:  [62-75] 62 (02/23 0200) Resp:  [10-28] 10 (02/23 0200) BP: (125-158)/(78-96) 137/87 (02/23 0200) SpO2:  [93 %-97 %] 93 % (02/23 0200) Weight:  [93.1 kg] 93.1 kg (02/22 1709),     Intake/Output Summary (Last 24 hours) at 08/10/2021 0911 Last data filed at 08/10/2021 0700 Gross per 24 hour  Intake 4828.04 ml  Output 1975 ml  Net 2853.04 ml    Results for orders placed or performed during the hospital encounter of 08/08/21 (from the past 24 hour(s))  Glucose, random     Status: Abnormal   Collection Time: 08/09/21  9:24 AM  Result Value Ref Range   Glucose, Bld 505 (HH) 70 - 99 mg/dL  Glucose, capillary     Status: Abnormal   Collection Time: 08/09/21 11:40 AM  Result Value Ref Range   Glucose-Capillary >600 (HH) 70 - 99 mg/dL  Basic metabolic panel     Status: Abnormal   Collection Time: 08/09/21 12:13 PM  Result Value Ref Range   Sodium 125 (L) 135 - 145 mmol/L   Potassium 4.9 3.5 - 5.1 mmol/L   Chloride 93 (L) 98 - 111 mmol/L   CO2 20 (L) 22 - 32 mmol/L   Glucose, Bld 738 (HH) 70 - 99 mg/dL   BUN 23 (H) 6 - 20 mg/dL   Creatinine, Ser 2.50 0.61 - 1.24 mg/dL   Calcium 8.2 (L) 8.9 - 10.3 mg/dL   GFR, Estimated >03 >70 mL/min   Anion gap 12 5 - 15  Basic metabolic panel     Status: Abnormal   Collection Time: 08/09/21 12:46 PM  Result Value Ref Range   Sodium 125 (L) 135 - 145 mmol/L   Potassium 4.8 3.5 - 5.1 mmol/L   Chloride 92 (L) 98 - 111 mmol/L   CO2 20 (L) 22 - 32 mmol/L   Glucose, Bld 733 (HH) 70 - 99 mg/dL   BUN 23 (H) 6 - 20 mg/dL   Creatinine, Ser 4.88 0.61 - 1.24 mg/dL   Calcium 8.1 (L) 8.9 - 10.3 mg/dL   GFR, Estimated >89 >16 mL/min   Anion gap 13 5 - 15  Osmolality     Status: Abnormal   Collection Time: 08/09/21 12:46 PM  Result Value Ref Range   Osmolality 313 (H)  275 - 295 mOsm/kg  Hemoglobin A1c     Status: Abnormal   Collection Time: 08/09/21 12:46 PM  Result Value Ref Range   Hgb A1c MFr Bld 11.9 (H) 4.8 - 5.6 %   Mean Plasma Glucose 295 mg/dL  Glucose, capillary     Status: Abnormal   Collection Time: 08/09/21  2:25 PM  Result Value Ref Range   Glucose-Capillary 523 (HH) 70 - 99 mg/dL  Glucose, capillary     Status: Abnormal   Collection Time: 08/09/21  3:04 PM  Result Value Ref Range   Glucose-Capillary 501 (HH) 70 - 99 mg/dL   Comment 1 Notify RN   Glucose, capillary     Status: Abnormal   Collection Time: 08/09/21  3:32 PM  Result Value Ref Range   Glucose-Capillary 466 (H) 70 - 99 mg/dL  Glucose, capillary     Status: Abnormal   Collection Time: 08/09/21  4:14 PM  Result Value Ref Range   Glucose-Capillary 401 (H)  70 - 99 mg/dL  Glucose, capillary     Status: Abnormal   Collection Time: 08/09/21  4:47 PM  Result Value Ref Range   Glucose-Capillary 305 (H) 70 - 99 mg/dL  Basic metabolic panel     Status: Abnormal   Collection Time: 08/09/21  5:08 PM  Result Value Ref Range   Sodium 130 (L) 135 - 145 mmol/L   Potassium 4.2 3.5 - 5.1 mmol/L   Chloride 99 98 - 111 mmol/L   CO2 22 22 - 32 mmol/L   Glucose, Bld 325 (H) 70 - 99 mg/dL   BUN 22 (H) 6 - 20 mg/dL   Creatinine, Ser 1.190.89 0.61 - 1.24 mg/dL   Calcium 8.5 (L) 8.9 - 10.3 mg/dL   GFR, Estimated >14>60 >78>60 mL/min   Anion gap 9 5 - 15  Glucose, capillary     Status: Abnormal   Collection Time: 08/09/21  5:45 PM  Result Value Ref Range   Glucose-Capillary 271 (H) 70 - 99 mg/dL  Glucose, capillary     Status: Abnormal   Collection Time: 08/09/21  6:43 PM  Result Value Ref Range   Glucose-Capillary 309 (H) 70 - 99 mg/dL  Glucose, capillary     Status: Abnormal   Collection Time: 08/09/21  7:16 PM  Result Value Ref Range   Glucose-Capillary 276 (H) 70 - 99 mg/dL   Comment 1 Notify RN    Comment 2 Document in Chart   Glucose, capillary     Status: Abnormal   Collection  Time: 08/09/21  8:29 PM  Result Value Ref Range   Glucose-Capillary 275 (H) 70 - 99 mg/dL  Basic metabolic panel     Status: Abnormal   Collection Time: 08/09/21  9:04 PM  Result Value Ref Range   Sodium 130 (L) 135 - 145 mmol/L   Potassium 3.9 3.5 - 5.1 mmol/L   Chloride 99 98 - 111 mmol/L   CO2 24 22 - 32 mmol/L   Glucose, Bld 260 (H) 70 - 99 mg/dL   BUN 20 6 - 20 mg/dL   Creatinine, Ser 2.950.73 0.61 - 1.24 mg/dL   Calcium 8.2 (L) 8.9 - 10.3 mg/dL   GFR, Estimated >62>60 >13>60 mL/min   Anion gap 7 5 - 15  Glucose, capillary     Status: Abnormal   Collection Time: 08/09/21  9:37 PM  Result Value Ref Range   Glucose-Capillary 229 (H) 70 - 99 mg/dL  Glucose, capillary     Status: Abnormal   Collection Time: 08/09/21 10:44 PM  Result Value Ref Range   Glucose-Capillary 189 (H) 70 - 99 mg/dL  Glucose, capillary     Status: Abnormal   Collection Time: 08/09/21 11:43 PM  Result Value Ref Range   Glucose-Capillary 191 (H) 70 - 99 mg/dL  Basic metabolic panel     Status: Abnormal   Collection Time: 08/10/21 12:01 AM  Result Value Ref Range   Sodium 131 (L) 135 - 145 mmol/L   Potassium 3.9 3.5 - 5.1 mmol/L   Chloride 97 (L) 98 - 111 mmol/L   CO2 25 22 - 32 mmol/L   Glucose, Bld 176 (H) 70 - 99 mg/dL   BUN 18 6 - 20 mg/dL   Creatinine, Ser 0.860.72 0.61 - 1.24 mg/dL   Calcium 8.3 (L) 8.9 - 10.3 mg/dL   GFR, Estimated >57>60 >84>60 mL/min   Anion gap 9 5 - 15  Glucose, capillary     Status: Abnormal   Collection  Time: 08/10/21 12:54 AM  Result Value Ref Range   Glucose-Capillary 142 (H) 70 - 99 mg/dL  Glucose, capillary     Status: Abnormal   Collection Time: 08/10/21  2:10 AM  Result Value Ref Range   Glucose-Capillary 134 (H) 70 - 99 mg/dL  Glucose, capillary     Status: Abnormal   Collection Time: 08/10/21  3:08 AM  Result Value Ref Range   Glucose-Capillary 141 (H) 70 - 99 mg/dL  Glucose, capillary     Status: Abnormal   Collection Time: 08/10/21  4:19 AM  Result Value Ref Range    Glucose-Capillary 162 (H) 70 - 99 mg/dL  Basic metabolic panel     Status: Abnormal   Collection Time: 08/10/21  4:27 AM  Result Value Ref Range   Sodium 133 (L) 135 - 145 mmol/L   Potassium 4.0 3.5 - 5.1 mmol/L   Chloride 99 98 - 111 mmol/L   CO2 27 22 - 32 mmol/L   Glucose, Bld 144 (H) 70 - 99 mg/dL   BUN 18 6 - 20 mg/dL   Creatinine, Ser 9.37 0.61 - 1.24 mg/dL   Calcium 8.1 (L) 8.9 - 10.3 mg/dL   GFR, Estimated >34 >28 mL/min   Anion gap 7 5 - 15  CBC     Status: Abnormal   Collection Time: 08/10/21  4:27 AM  Result Value Ref Range   WBC 11.1 (H) 4.0 - 10.5 K/uL   RBC 3.64 (L) 4.22 - 5.81 MIL/uL   Hemoglobin 11.6 (L) 13.0 - 17.0 g/dL   HCT 76.8 (L) 11.5 - 72.6 %   MCV 92.0 80.0 - 100.0 fL   MCH 31.9 26.0 - 34.0 pg   MCHC 34.6 30.0 - 36.0 g/dL   RDW 20.3 55.9 - 74.1 %   Platelets 288 150 - 400 K/uL   nRBC 0.0 0.0 - 0.2 %  Magnesium     Status: None   Collection Time: 08/10/21  4:27 AM  Result Value Ref Range   Magnesium 2.0 1.7 - 2.4 mg/dL  Glucose, capillary     Status: Abnormal   Collection Time: 08/10/21  5:16 AM  Result Value Ref Range   Glucose-Capillary 135 (H) 70 - 99 mg/dL  Glucose, capillary     Status: Abnormal   Collection Time: 08/10/21  6:28 AM  Result Value Ref Range   Glucose-Capillary 132 (H) 70 - 99 mg/dL  Glucose, capillary     Status: Abnormal   Collection Time: 08/10/21  7:40 AM  Result Value Ref Range   Glucose-Capillary 127 (H) 70 - 99 mg/dL  Glucose, capillary     Status: Abnormal   Collection Time: 08/10/21  9:06 AM  Result Value Ref Range   Glucose-Capillary 170 (H) 70 - 99 mg/dL    SUBJECTIVE:  Feeling better, less pain  OBJECTIVE:  swelling improved, periorbital swelling almost completely resolved.  Central area from previous I & D draining.  I cleaned the area with alcohol swab and opened this area up with forceps, then expressed about 3-5cc pus from the wound.  This decreased the swelling centrally, no more pus could be expressed.     IMPRESSION:  Facial cellulitis/abscess.  Much improved.  I opened the wound at bedside today getting some more pus out.  There was no more fluctuance after I expressed the pus out.  Culture now showing Staph aureous-sensitivities are pending.  PLAN:  Continue IV vanc.  Plan to stop insulin drip and return to the  floor from ICU.  Infection improving.  Will follow.  Davina Poke 08/10/2021, 9:11 AM

## 2021-08-10 NOTE — Assessment & Plan Note (Signed)
Nicotine patch ordered. Counseled and encourage to stop smoking.

## 2021-08-10 NOTE — Progress Notes (Addendum)
Inpatient Diabetes Program Recommendations  AACE/ADA: New Consensus Statement on Inpatient Glycemic Control (2015)  Target Ranges:  Prepandial:   less than 140 mg/dL      Peak postprandial:   less than 180 mg/dL (1-2 hours)      Critically ill patients:  140 - 180 mg/dL    Latest Reference Range & Units 09/21/20 12:12 08/09/21 12:46  Hemoglobin A1C 4.8 - 5.6 % 8.3 (H) 11.9 (H)  (295 mg/dl)  (H): Data is abnormally high  Latest Reference Range & Units 08/09/21 12:13  Glucose 70 - 99 mg/dL 738 (HH)  (HH): Data is critically high  Latest Reference Range & Units 08/09/21 08:48 08/09/21 11:40 08/09/21 14:25 08/09/21 15:04 08/09/21 15:32 08/09/21 16:14 08/09/21 16:47 08/09/21 17:45  Glucose-Capillary 70 - 99 mg/dL 500 (H) >600 (HH) 523 (HH) 501 (HH)  IV Insulin Drip Started '@1506'  466 (H) 401 (H) 305 (H) 271 (H)    Latest Reference Range & Units 08/10/21 00:54 08/10/21 02:10 08/10/21 03:08 08/10/21 04:19 08/10/21 05:16 08/10/21 06:28  Glucose-Capillary 70 - 99 mg/dL 142 (H) 134 (H) 141 (H) 162 (H) 135 (H) 132 (H)  IV Insulin Drip Infusing     Admit with:  Facial abscess/cellulitis Uncontrolled type 2 diabetes mellitus with hyperglycemia   History: DM2  Home DM Meds: Glipizide 10 mg BID       Metformin 1000 mg BID  Current Orders: IV Insulin Drip   PCP: Dr. Lovie Macadamia with [facility] A1c was 12.4% at last visit 07/05/2021 Was NOT taking Oral DM meds according to PCP notes due to inability to afford $4 co-pay with Medicaid  Got Decadron 10 mg at 2306 on 02/21 and Decadron 10 mg at 0606 on 02/22    MD- Note 4:27am BMET shows the following: Glucose 144 Anion Gap 7 CO2 level 27 CBGs are stable less than 180  When you allow pt to transition to SQ Insulin, please consider:  1. Start Semglee 14 units Daily (0.15 units/kg based on weight of 93 kg) Make sure to continue IV Insulin Drip for 2 hours after 1st dose Semglee on board then can stop IV Insulin Drip  2. Start  Novolog Moderate Correction Scale/ SSI (0-15 units) TID AC + HS    Addendum 11:15am--Met w/ pt at bedside to review current CBGs, transition to SQ Insulin, current A1c, etc.  Pt told me he saw his PCP in January and PCP started him back on Metformin and Glipizide meds, however, pt stated he could NOT afford the $4 co-pays for Medicaid and did not start these 2 diabetes meds until Feb 13th--has only been taking the meds steadily for about 10 days since they were re-prescribed.  Told me he was in a diabetes study years ago (at [facility]) and took an injectable medication but could not remember the name of it and also told me he was given insulin for 2 months in 2019 when he was in jail.  Is open to taking insulin if needed but concerned about the cost--I did remind pt that all insulins covered by Medicaid will be $4 per Rx.  We also talked about his elevated glucose on admission (738 mg/dl) and his need for IV Insulin through this AM.  Discussed transition to SQ Insulin this AM and explained what Semglee and Novolog insulins are and how they work and when they will be given.  Also Spoke with patient about his current A1c of 11.9% (down from 12.4% in Jan 2023).  Explained what  an A1c is and what it measures.  Reminded patient that his goal A1c is 7% or less per ADA standards to prevent both acute and long-term complications.  Explained to patient the extreme importance of good glucose control at home.  Encouraged patient to check his CBGs at least bid at home (fasting and another check within the day) and to record all CBGs in a logbook for his PCP to review.  Needs CBG meter for home--have requested Rx for CBG at time of d/c home.  Pt stated to me he did not have any questions about healthy diabetes nutrition at home, however, he did admit to alcohol consumption at home when his roommate buys beer.  Talked with pt about the dangers of ETOH when taking insulin since ETOH metabolism can stunt the liver's  ability to produce glucose in the event of a Hypoglycemic event.  Asked pt to refrain from alcohol consumption when he goes home.  Plan to re-visit pt Friday 2/24 to review insulin pen if decision made by MD to send pt home on insulin for better CBG control.      --Will follow patient during hospitalization--  Wyn Quaker RN, MSN, CDE Diabetes Coordinator Inpatient Glycemic Control Team Team Pager: [phone] (8a-5p)

## 2021-08-10 NOTE — Progress Notes (Signed)
°  Progress Note   Patient: Timothy [name]. TIR:[id number] DOB: [birth year] DOA: 08/08/2021     2 DOS: the patient was seen and examined on 08/10/2021   Brief hospital course: Timothy [name]. is a 61 y.o. male with medical history significant of DMT2, COPD, HTN who presents with left-sided facial pain and swelling that started 3 to 4 days ago.  He thought initially had a small pimple and he tried to squeeze on the left side of his face, denies injury or bug bite.  Admitted for IV antibiotics with ENT consulted for facial cellulitis with abscess.  Assessment and Plan: * Facial abscess ENT following, appreciate recommendations. Bedside I&D but ENT this morning. Continue IV vancomycin and Decadron. Follow cultures for sensitivities, growing Staph aureus.   Facial cellulitis POA, purulent with abscess.  Continue antibiotics as above and follow-up ENT recommendations.    Uncontrolled type 2 diabetes mellitus with hyperglycemia (HCC) A1c 11.9%.  Patient was recently resumed on metformin and glipizide by PCP.  Required insulin drip in the ICU, weaned off this morning (2/23). -- Diabetes coordinator following, appreciate recommendations -- Semglee 14 units daily -- Sliding scale NovoLog --Adjust insulin for goal 140-180 --Likely to discharge on insulin, patient agreeable if needed   Hyponatremia- (present on admission) Initial sodium 125, likely pseudohyponatremia in the setting of hyperglycemia with glucose above 700. Sodium near normal 133 today. -- Monitor BMP  Essential hypertension- (present on admission) Continue with norvasc, clonidine, metoprolol. Monitor BP  COPD (chronic obstructive pulmonary disease) (HCC) Continue trelegy elliptica daily.  Duoneb q 6 hour as needed.   Current smoker- (present on admission) Nicotine patch ordered. Counseled and encourage to stop smoking.        Subjective: Pt seen in ICU after eating breakfast today.  Has not used insulin  before but states he is willing to use it if needed.  He reports facial pain after bedside I&D earlier this morning.  Swelling overall getting better.  No fever/chills or other acute complaints.   Physical Exam: Vitals:   08/10/21 0200 08/10/21 0747 08/10/21 0803 08/10/21 1021  BP: 137/87  (!) 170/106 (!) 146/94  Pulse: 62  65   Resp: 10   11  Temp: 97.6 F (36.4 C) 98 F (36.7 C)    TempSrc: Oral Oral    SpO2: 93%  92%   Weight:      Height:       General exam: awake, alert, no acute distress HEENT: left cheek bone with bandaid overlying abscess site s/p I&D, moist mucus membranes, hearing grossly normal  Respiratory system: CTAB but diminished throughout, no wheezes, normal respiratory effort. Cardiovascular system: normal S1/S2, RRR, no pedal edema.   Central nervous system: A&O x3. no gross focal neurologic deficits, normal speech Extremities: moves all, no edema, normal tone Skin: mild facial erythema surrounding left side of upper face, dry, intact Psychiatry: normal mood, congruent affect, judgement and insight appear normal   Data Reviewed:  Labs reviewed and notable for CBG's in 100's on insulin drip. Anion gap closed x 2+, Na 133, glucose 144, Ca 8.1, WBC 11.1, Hbg 11.6,   Family Communication: none  Disposition: Status is: Inpatient Remains inpatient appropriate because: continuing on IV antibiotics pending cultures.          Planned Discharge Destination: Home     Time spent: 35 minutes  Author: Pennie Banter, DO 08/10/2021 5:32 PM  For on call review [email or web address].

## 2021-08-11 ENCOUNTER — Other Ambulatory Visit: Payer: Self-pay

## 2021-08-11 ENCOUNTER — Encounter: Payer: Self-pay | Admitting: Family Medicine

## 2021-08-11 ENCOUNTER — Encounter
Admission: EM | Disposition: A | Discharge status: Home / Self Care | Payer: Self-pay | Source: Home / Self Care | Attending: Internal Medicine

## 2021-08-11 ENCOUNTER — Inpatient Hospital Stay: Payer: Medicaid Other | Admitting: Anesthesiology

## 2021-08-11 DIAGNOSIS — L0201 Cutaneous abscess of face: Secondary | ICD-10-CM | POA: Diagnosis not present

## 2021-08-11 HISTORY — PX: INCISION AND DRAINAGE ABSCESS: SHX5864

## 2021-08-11 LAB — BASIC METABOLIC PANEL
Anion gap: 9 (ref 5–15)
BUN: 17 mg/dL (ref 6–20)
CO2: 28 mmol/L (ref 22–32)
Calcium: 8.3 mg/dL — ABNORMAL LOW (ref 8.9–10.3)
Chloride: 96 mmol/L — ABNORMAL LOW (ref 98–111)
Creatinine, Ser: 0.88 mg/dL (ref 0.61–1.24)
GFR, Estimated: >60 mL/min (ref >60)
Glucose, Bld: 291 mg/dL — ABNORMAL HIGH (ref 70–99)
Potassium: 3.8 mmol/L (ref 3.5–5.1)
Sodium: 133 mmol/L — ABNORMAL LOW (ref 135–145)

## 2021-08-11 LAB — CBC
HCT: 37.8 % — ABNORMAL LOW (ref 39.0–52.0)
Hemoglobin: 12.9 g/dL — ABNORMAL LOW (ref 13.0–17.0)
MCH: 31.8 pg (ref 26.0–34.0)
MCHC: 34.1 g/dL (ref 30.0–36.0)
MCV: 93.1 fL (ref 80.0–100.0)
Platelets: 305 10*3/uL (ref 150–400)
RBC: 4.06 MIL/uL — ABNORMAL LOW (ref 4.22–5.81)
RDW: 12.6 % (ref 11.5–15.5)
WBC: 7.6 10*3/uL (ref 4.0–10.5)
nRBC: 0 % (ref 0.0–0.2)

## 2021-08-11 LAB — GLUCOSE, CAPILLARY
Glucose-Capillary: 275 mg/dL — ABNORMAL HIGH (ref 70–99)
Glucose-Capillary: 321 mg/dL — ABNORMAL HIGH (ref 70–99)
Glucose-Capillary: 182 mg/dL — ABNORMAL HIGH (ref 70–99)
Glucose-Capillary: 168 mg/dL — ABNORMAL HIGH (ref 70–99)
Glucose-Capillary: 244 mg/dL — ABNORMAL HIGH (ref 70–99)

## 2021-08-11 SURGERY — INCISION AND DRAINAGE, ABSCESS
Anesthesia: Monitor Anesthesia Care | Laterality: Left

## 2021-08-11 MED ORDER — PROPOFOL 500 MG/50ML IV EMUL
INTRAVENOUS | Status: DC | PRN
Start: 2021-08-11 — End: 2021-08-11
  Administered 2021-08-11: 200 ug/kg/min via INTRAVENOUS

## 2021-08-11 MED ORDER — BACITRACIN ZINC 500 UNIT/GM EX OINT
TOPICAL_OINTMENT | CUTANEOUS | Status: AC
  Filled 2021-08-11: qty 28.35

## 2021-08-11 MED ORDER — LIDOCAINE-EPINEPHRINE 1 %-1:100000 IJ SOLN
INTRAMUSCULAR | Status: DC | PRN
  Administered 2021-08-11: 1 mL

## 2021-08-11 MED ORDER — FENTANYL CITRATE (PF) 100 MCG/2ML IJ SOLN: 25.0000 ug | INTRAMUSCULAR | Status: DC | PRN

## 2021-08-11 MED ORDER — FENTANYL CITRATE (PF) 100 MCG/2ML IJ SOLN
INTRAMUSCULAR | Status: AC
Start: 2021-08-11 — End: ?
  Filled 2021-08-11: qty 2

## 2021-08-11 MED ORDER — INSULIN GLARGINE-YFGN 100 UNIT/ML ~~LOC~~ SOLN
18.0000 [IU] | Freq: Every day | SUBCUTANEOUS | Status: DC
  Filled 2021-08-11: qty 0.18

## 2021-08-11 MED ORDER — LIDOCAINE-EPINEPHRINE 1 %-1:100000 IJ SOLN
INTRAMUSCULAR | Status: AC
  Filled 2021-08-11: qty 1

## 2021-08-11 MED ORDER — FENTANYL CITRATE (PF) 100 MCG/2ML IJ SOLN
INTRAMUSCULAR | Status: DC | PRN
  Administered 2021-08-11 (×2): 50 ug via INTRAVENOUS

## 2021-08-11 MED ORDER — INSULIN ASPART 100 UNIT/ML IJ SOLN
0.0000 [IU] | Freq: Three times a day (TID) | INTRAMUSCULAR | Status: DC
  Administered 2021-08-11: 11 [IU] via SUBCUTANEOUS
  Administered 2021-08-12: 5 [IU] via SUBCUTANEOUS
  Filled 2021-08-11 (×2): qty 1

## 2021-08-11 MED ORDER — PHENYLEPHRINE HCL (PRESSORS) 10 MG/ML IV SOLN
INTRAVENOUS | Status: AC
  Filled 2021-08-11: qty 1

## 2021-08-11 MED ORDER — ONDANSETRON HCL 4 MG/2ML IJ SOLN: 4.0000 mg | Freq: Once | INTRAMUSCULAR | Status: DC | PRN

## 2021-08-11 MED ORDER — CEFAZOLIN SODIUM-DEXTROSE 1-4 GM/50ML-% IV SOLN
1.0000 g | Freq: Three times a day (TID) | INTRAVENOUS | Status: DC
  Administered 2021-08-12 (×2): 1 g via INTRAVENOUS
  Filled 2021-08-11 (×4): qty 50

## 2021-08-11 MED ORDER — MIDAZOLAM HCL 2 MG/2ML IJ SOLN
INTRAMUSCULAR | Status: DC | PRN
  Administered 2021-08-11: 2 mg via INTRAVENOUS

## 2021-08-11 MED ORDER — SODIUM CHLORIDE 0.9 % IV SOLN: INTRAVENOUS | Status: DC | PRN

## 2021-08-11 MED ORDER — MIDAZOLAM HCL 2 MG/2ML IJ SOLN
INTRAMUSCULAR | Status: AC
  Filled 2021-08-11: qty 2

## 2021-08-11 SURGICAL SUPPLY — 36 items
BLADE SURG 15 STRL LF DISP TIS (BLADE) ×2 IMPLANT
BLADE SURG 15 STRL SS (BLADE) ×2
CORD BIP STRL DISP 12FT (MISCELLANEOUS) ×1 IMPLANT
DERMABOND ADVANCED (GAUZE/BANDAGES/DRESSINGS) ×1
DERMABOND ADVANCED .7 DNX12 (GAUZE/BANDAGES/DRESSINGS) ×1 IMPLANT
DRAIN PENROSE 12X.25 LTX STRL (MISCELLANEOUS) ×1 IMPLANT
DRAPE MAG INST 16X20 L/F (DRAPES) ×1 IMPLANT
DRSG TEGADERM 4X4.75 (GAUZE/BANDAGES/DRESSINGS) ×1 IMPLANT
DRSG TELFA 4X3 1S NADH ST (GAUZE/BANDAGES/DRESSINGS) ×1 IMPLANT
ELECT CAUTERY BLADE TIP 2.5 (TIP) ×2
ELECT REM PT RETURN 9FT ADLT (ELECTROSURGICAL) ×2
ELECTRODE CAUTERY BLDE TIP 2.5 (TIP) ×1 IMPLANT
ELECTRODE REM PT RTRN 9FT ADLT (ELECTROSURGICAL) ×1 IMPLANT
FORCEPS JEWEL BIP 4-3/4 STR (INSTRUMENTS) ×1 IMPLANT
GAUZE 4X4 16PLY ~~LOC~~+RFID DBL (SPONGE) ×2 IMPLANT
GAUZE PACKING IODOFORM 1/2 (PACKING) ×1 IMPLANT
GLOVE SURG ENC MOIS LTX SZ7.5 (GLOVE) ×2 IMPLANT
GOWN STRL REUS W/ TWL LRG LVL3 (GOWN DISPOSABLE) ×2 IMPLANT
GOWN STRL REUS W/TWL LRG LVL3 (GOWN DISPOSABLE) ×2
KIT TURNOVER KIT A (KITS) ×2 IMPLANT
LABEL OR SOLS (LABEL) ×2 IMPLANT
MANIFOLD NEPTUNE II (INSTRUMENTS) ×2 IMPLANT
NDL HYPO 25GX1X1/2 BEV (NEEDLE) ×1 IMPLANT
NEEDLE HYPO 25GX1X1/2 BEV (NEEDLE) ×2 IMPLANT
NS IRRIG 500ML POUR BTL (IV SOLUTION) ×2 IMPLANT
PACK HEAD/NECK (MISCELLANEOUS) ×2 IMPLANT
STAPLER SKIN PROX 35W (STAPLE) ×1 IMPLANT
SUCTION FRAZIER HANDLE 10FR (MISCELLANEOUS) ×1
SUCTION TUBE FRAZIER 10FR DISP (MISCELLANEOUS) ×1 IMPLANT
SUT SILK 2 0 (SUTURE) ×2
SUT SILK 2-0 18XBRD TIE 12 (SUTURE) ×2 IMPLANT
SUT VIC AB 4-0 RB1 18 (SUTURE) ×2 IMPLANT
SWAB CULTURE AMIES ANAERIB BLU (MISCELLANEOUS) ×2 IMPLANT
SYR 10ML LL (SYRINGE) ×2 IMPLANT
SYR BULB EAR ULCER 3OZ GRN STR (SYRINGE) ×2 IMPLANT
WATER STERILE IRR 500ML POUR (IV SOLUTION) ×2 IMPLANT

## 2021-08-11 NOTE — Progress Notes (Signed)
Progress Note   Patient: Timothy [name]. [id number] DOB: Apr 28, 1961 DOA: 08/08/2021     3 DOS: the patient was seen and examined on 08/11/2021   Brief hospital course: Timothy Orsak. is a 61 y.o. male with medical history significant of DMT2, COPD, HTN who presents with left-sided facial pain and swelling that started 3 to 4 days ago.  He thought initially had a small pimple and he tried to squeeze on the left side of his face, denies injury or bug bite.  Admitted for IV antibiotics with ENT consulted for facial cellulitis with abscess.  Assessment and Plan: * Facial abscess ENT following, appreciate recommendations. Bedside I&D but ENT morning of 2/23. ENT plans for I&D under anesthesia in the OR later today. Initially on empiric IV vancomycin. Cultures grew MSSA Transitioned to cefazolin today. Continue Decadron. Follow-up ENT recommendations   Facial cellulitis POA, purulent with abscess.  Continue antibiotics as above and follow-up ENT recommendations.    Uncontrolled type 2 diabetes mellitus with hyperglycemia (HCC) A1c 11.9%.  Patient was recently resumed on metformin and glipizide by PCP.  Required insulin drip in the ICU, weaned off morning of 2/23. 2/24: Sugars uncontrolled in the upper 200s, 321 at lunchtime today -- Diabetes coordinator following, appreciate recommendations -- Increase Semglee 14>> 18 units daily -- Moderate sliding scale NovoLog --Adjust insulin for goal 140-180 --Likely to discharge on insulin, patient agreeable if needed   Hyponatremia- (present on admission) Initial sodium 125, likely pseudohyponatremia in the setting of hyperglycemia with glucose above 700. Sodium near normal 133 today. -- Monitor BMP  Essential hypertension- (present on admission) Continue with norvasc, clonidine, metoprolol. Monitor BP  COPD (chronic obstructive pulmonary disease) (HCC) Stable, not acutely exacerbated. Continue trelegy elliptica daily.   Duoneb q 6 hour as needed.   Current smoker- (present on admission) Nicotine patch ordered. Counseled and encourage to stop smoking.        Subjective: Patient awake resting in bed when seen today.  He reports having headache this morning and left-sided facial pain in the area of his infection.  Denies any other acute complaints at this time including fevers or chills.  Remains agreeable to insulin at discharge if needed.  Physical Exam: Vitals:   08/11/21 0522 08/11/21 0749 08/11/21 1212 08/11/21 1538  BP: (!) 149/94 (!) 164/90 124/77 125/82  Pulse: 67 66 66 76  Resp: 18 18 16 18   Temp: 98 F (36.7 C) 98.6 F (37 C) 97.8 F (36.6 C) (!) 96.9 F (36.1 C)  TempSrc: Oral Oral  Temporal  SpO2: 93% 96% 94% 95%  Weight:    93.1 kg  Height:    5\' 10"  (1.778 m)   General exam: awake, alert, no acute distress HEENT: Left facial erythema surrounding clean dry intact Band-Aid over open abscess, moist mucus membranes, hearing grossly normal  Respiratory system: CTAB, no wheezes, rales or rhonchi, normal respiratory effort. Cardiovascular system: normal S1/S2, RRR, no JVD, murmurs, rubs, gallops, no pedal edema.   Gastrointestinal system: soft, NT, ND, no HSM felt, +bowel sounds. Central nervous system: A&O x 3. no gross focal neurologic deficits, normal speech Extremities: moves all, no edema, normal tone Psychiatry: normal mood, congruent affect, judgement and insight appear normal   Data Reviewed:  Labs reviewed and notable for sodium 133, chloride 96, glucose 291, calcium 8.3, hemoglobin 12.9, CBGs 275, 321  Family Communication: None  Disposition: Status is: Inpatient Remains inpatient appropriate because: Remains on IV antibiotics and undergoing surgical I&D this  afternoon.  Blood sugars remain uncontrolled with insulin adjustments underway      Planned Discharge Destination: Home     Time spent: 35 minutes  Author: Ezekiel Slocumb, DO 08/11/2021 5:14  PM  For on call review [email or web address].

## 2021-08-11 NOTE — Op Note (Signed)
08/11/2021  5:18 PM    Timothy [name]  [id number]   Pre-Op Dx: Left facial abscess  Post-op Dx: SAME  Proc: Incision and drainage of left preauricular/cheek abscess  Surg:  Roena Malady  Anes:  GOT  EBL: Less than 10 cc  Comp: None  Findings: Purulent contents with multiple loculated pockets of purulent debris, approximately 10 cc of pus expressed.  Procedure: Timothy [name] was identified in the holding area taken to the operating room placed in supine position.  After IV sedation the left face was prepped and draped sterilely.  A local anesthetic of 1% lidocaine with 1 100,000 units of epinephrine was used to inject overlying the cystic mass.  There were several tracks of drainage from previous I&D and spontaneous drainage.  A 15 blade was used to break make approximately 2-1/2 cm incision over the cystic mass.  This was taken down to and into the subcutaneous tissues.  Hemostat was then used to probe into the abscess pocket which was immediately entered.  Pus was encountered and sent for culture.  The wound was then probed with a hemostat in all directions breaking up multiple loculations which allowed the pus to drain more freely.  With the wound open saline mixed with Betadine solution was used to irrigate the wound multiple times.  The area was then repalpated there is no other loculation or abscess pockets identifiable.  The wound was then packed with half-inch iodoform gauze was brought out of the wound.  Gauze was then placed over the wound followed by Tegaderm.  Patient was then awakened in the operating room and taken to the recovery room in stable condition.  Dispo:   Good  Plan: Patient will be transferred back to the floor to continue his IV antibiotics overnight.  If he continues to improve as possible to discharge him to home tomorrow on oral antibiotic depending on his response following I&D of the abscess.  Roena Malady  08/11/2021 5:18 PM

## 2021-08-11 NOTE — Progress Notes (Signed)
Order received from Dr Jenne Campus to make the patient NPO

## 2021-08-11 NOTE — Anesthesia Procedure Notes (Signed)
Date/Time: 08/11/2021 4:40 PM Performed by: Nelda Marseille, CRNA Pre-anesthesia Checklist: Patient identified, Emergency Drugs available, Suction available, Patient being monitored and Timeout performed Oxygen Delivery Method: Nasal cannula and Simple face mask

## 2021-08-11 NOTE — Anesthesia Preprocedure Evaluation (Addendum)
Anesthesia Evaluation  Patient identified by MRN, date of birth, ID band Patient awake    Reviewed: Allergy & Precautions, NPO status , Patient's Chart, lab work & pertinent test results  History of Anesthesia Complications Negative for: history of anesthetic complications  Airway Mallampati: III  TM Distance: >3 FB Neck ROM: full    Dental no notable dental hx.    Pulmonary neg shortness of breath, COPD (Stage 3 severe COPD),  COPD inhaler, Current Smoker and Patient abstained from smoking.,  82 pack years  S/p VATS 08/15/20 w/ h/o pleural empyema  Chronic respiratory failure with hypoxia oxygen at 3 L/min   Pulmonary exam normal        Cardiovascular Exercise Tolerance: Poor hypertension, Pt. on medications + Peripheral Vascular Disease and +CHF (DD)  (-) Orthopnea and (-) PND Normal cardiovascular exam+ dysrhythmias   ECHO 12/22: 1. Left ventricular ejection fraction, by estimation, is 55 to 60%. The  left ventricle has normal function. The left ventricle has no regional  wall motion abnormalities. There is mild left ventricular hypertrophy.  Left ventricular diastolic parameters  are consistent with Grade I diastolic dysfunction (impaired relaxation).  The average left ventricular global longitudinal strain is -13.4 %. The  global longitudinal strain is abnormal.  2. Right ventricular systolic function is normal. The right ventricular  size is normal. Tricuspid regurgitation signal is inadequate for assessing  PA pressure.  3. Left atrial size was mild to moderately dilated.  4. The mitral valve is normal in structure. Mild mitral valve  regurgitation. No evidence of mitral stenosis.  5. The aortic valve is tricuspid. Aortic valve regurgitation is not  visualized. Aortic valve sclerosis is present, with no evidence of aortic  valve stenosis.  6. The inferior vena cava is normal in size with greater than 50%   respiratory variability, suggesting right atrial pressure of 3 mmHg.   Neuro/Psych PSYCHIATRIC DISORDERS Anxiety Depression  Neuromuscular disease (Peripheral Neuropathy)    GI/Hepatic Neg liver ROS, GERD  ,  Endo/Other  diabetes (A1c 11.9%. Needed Insulin gtt this admission), Poorly Controlled, Type 2, Insulin Dependent  Renal/GU      Musculoskeletal  (+) Arthritis ,   Abdominal   Peds  Hematology  (+) Blood dyscrasia, anemia ,   Anesthesia Other Findings Left facial abscess Sodium 125 on admission likely from hyperglycemia, 133 today.  Past Medical History: No date: Diabetes mellitus without complication ([place]) No date: Hypercholesteremia No date: Hypertension  Past Surgical History: 03/22/2020: AMPUTATION; Left     Comment:  Procedure: AMPUTATION RAY-1st Ray Left Foot;  Surgeon:               [name], [name];  Location: [facility];  Service:               Podiatry;  Laterality: Left; 03/16/2020: AMPUTATION TOE; Left     Comment:  Procedure: AMPUTATION GREAT TOE;  Surgeon: [name],               [name];  Location: [facility];  Service: Podiatry;                Laterality: Left; 12/22/2019: COLONOSCOPY WITH PROPOFOL; N/A     Comment:  Procedure: COLONOSCOPY WITH PROPOFOL;  Surgeon: [name], MD;  Location: [facility];  Service:               Endoscopy;  Laterality: N/A; 03/18/2020: LOWER  EXTREMITY ANGIOGRAPHY; Left     Comment:  Procedure: Lower Extremity Angiography;  Surgeon:               [name], MD;  Location: [place] [facility];               Service: Cardiovascular;  Laterality: Left; No date: TONSILLECTOMY 08/15/2020: VIDEO ASSISTED THORACOSCOPY (VATS)/DECORTICATION; Left     Comment:  Procedure: VIDEO ASSISTED THORACOSCOPY               (VATS)/DECORTICATION;  Surgeon: [name], MD;              Location: [place];  Service: Thoracic;  Laterality: Left; No date: WISDOM TOOTH EXTRACTION  BMI    Body Mass Index: 29.45  kg/m      Reproductive/Obstetrics negative OB ROS                        Anesthesia Physical Anesthesia Plan  ASA: 3 and emergent  Anesthesia Plan: MAC   Post-op Pain Management: Regional block*   Induction: Intravenous  PONV Risk Score and Plan: 2 and Ondansetron  Airway Management Planned: Nasal Cannula and Natural Airway  Additional Equipment:   Intra-op Plan:   Post-operative Plan:   Informed Consent:   Plan Discussed with: Anesthesiologist, CRNA and Surgeon  Anesthesia Plan Comments:     Anesthesia Quick Evaluation

## 2021-08-11 NOTE — Progress Notes (Signed)
08/11/2021 1:37 PM  Timothy [name] [id number]  Patient feeling better, he says wound continues to drain    Temp:  [97.8 F (36.6 C)-98.6 F (37 C)] 97.8 F (36.6 C) (02/24 1212) Pulse Rate:  [66-67] 66 (02/24 1212) Resp:  [16-18] 16 (02/24 1212) BP: (124-164)/(77-94) 124/77 (02/24 1212) SpO2:  [93 %-96 %] 94 % (02/24 1212),     Intake/Output Summary (Last 24 hours) at 08/11/2021 1337 Last data filed at 08/11/2021 0954 Gross per 24 hour  Intake 400 ml  Output 500 ml  Net -100 ml    Results for orders placed or performed during the hospital encounter of 08/08/21 (from the past 24 hour(s))  Glucose, capillary     Status: Abnormal   Collection Time: 08/10/21  4:34 PM  Result Value Ref Range   Glucose-Capillary 217 (H) 70 - 99 mg/dL  Glucose, capillary     Status: Abnormal   Collection Time: 08/10/21  9:21 PM  Result Value Ref Range   Glucose-Capillary 290 (H) 70 - 99 mg/dL  CBC     Status: Abnormal   Collection Time: 08/11/21  4:59 AM  Result Value Ref Range   WBC 7.6 4.0 - 10.5 K/uL   RBC 4.06 (L) 4.22 - 5.81 MIL/uL   Hemoglobin 12.9 (L) 13.0 - 17.0 g/dL   HCT 56.2 (L) 56.3 - 89.3 %   MCV 93.1 80.0 - 100.0 fL   MCH 31.8 26.0 - 34.0 pg   MCHC 34.1 30.0 - 36.0 g/dL   RDW 73.4 28.7 - 68.1 %   Platelets 305 150 - 400 K/uL   nRBC 0.0 0.0 - 0.2 %  Basic metabolic panel     Status: Abnormal   Collection Time: 08/11/21  4:59 AM  Result Value Ref Range   Sodium 133 (L) 135 - 145 mmol/L   Potassium 3.8 3.5 - 5.1 mmol/L   Chloride 96 (L) 98 - 111 mmol/L   CO2 28 22 - 32 mmol/L   Glucose, Bld 291 (H) 70 - 99 mg/dL   BUN 17 6 - 20 mg/dL   Creatinine, Ser 1.57 0.61 - 1.24 mg/dL   Calcium 8.3 (L) 8.9 - 10.3 mg/dL   GFR, Estimated >26 >20 mL/min   Anion gap 9 5 - 15  Glucose, capillary     Status: Abnormal   Collection Time: 08/11/21  8:22 AM  Result Value Ref Range   Glucose-Capillary 275 (H) 70 - 99 mg/dL  Glucose, capillary     Status: Abnormal   Collection Time:  08/11/21 12:09 PM  Result Value Ref Range   Glucose-Capillary 321 (H) 70 - 99 mg/dL    SUBJECTIVE:  Overall feeling better  OBJECTIVE:  Swelling still decreased, wound continues to drain but is recollecting fluid.  IMPRESSION:  facial cellulitis/abscess  PLAN:  Long talk with [name], despite having been drained twice, the area appears to be re accumulating.  I think it needs to be opened widely, irrigated, and packed with iodoform gauze to prevent this from happening.  He would prefer this be done under anesthesia.  I have posted him for I & D of abscess this afternoon.  He has not had anything to eat or drink since before breakfast.  Risks of bleeding/infection/scarring discussed, he is ready to proceed.  We can probably do this with IV sedation and local anesthesia.  Culture Staph A pansensitive.  We should be able to DC him tomorrow on PO meds.    Davina Poke 08/11/2021,  1:37 PM

## 2021-08-11 NOTE — Transfer of Care (Signed)
Immediate Anesthesia Transfer of Care Note  Patient: Timothy [name].  Procedure(s) Performed: INCISION AND DRAINAGE ABSCESS (Left)  Patient Location: PACU  Anesthesia Type:General  Level of Consciousness: sedated  Airway & Oxygen Therapy: Patient Spontanous Breathing and Patient connected to face mask oxygen  Post-op Assessment: Report given to RN and Post -op Vital signs reviewed and stable  Post vital signs: Reviewed and stable  Last Vitals:  Vitals Value Taken Time  BP 113/79 08/11/21 1715  Temp    Pulse 32 08/11/21 1716  Resp 13 08/11/21 1716  SpO2 97 % 08/11/21 1716  Vitals shown include unvalidated device data.  Last Pain:  Vitals:   08/11/21 1538  TempSrc: Temporal  PainSc: 0-No pain      Patients Stated Pain Goal: 0 (08/10/21 1230)  Complications: No notable events documented.

## 2021-08-11 NOTE — Progress Notes (Addendum)
Inpatient Diabetes Program Recommendations  AACE/ADA: New Consensus Statement on Inpatient Glycemic Control (2015)  Target Ranges:  Prepandial:   less than 140 mg/dL      Peak postprandial:   less than 180 mg/dL (1-2 hours)      Critically ill patients:  140 - 180 mg/dL    Latest Reference Range & Units 08/10/21 07:40 08/10/21 09:06 08/10/21 12:42 08/10/21 16:34 08/10/21 21:21  Glucose-Capillary 70 - 99 mg/dL 127 (H) 170 (H)  2 units Novolog _0   14 units Semglee _1  164 (H) 217 (H)  3 units Novolog  290 (H)    Latest Reference Range & Units 08/11/21 08:22  Glucose-Capillary 70 - 99 mg/dL 275 (H)     Admit with:  Facial abscess/cellulitis Uncontrolled type 2 diabetes mellitus with hyperglycemia    History: DM2   Home DM Meds: Glipizide 10 mg BID                             Metformin 1000 mg BID   Current Orders: Semglee 14 units Daily     Novolog Sensitive Correction Scale/ SSI (0-9 units) TID AC     PCP: Dr. Lovie Macadamia with [facility] A1c was 12.4% at last visit 07/05/2021 Was NOT taking Oral DM meds according to PCP notes due to inability to afford $4 co-pay with Medicaid    MD- Note AM CBG 275 today  Please consider increasing the Semglee to 18 units Daily (0.2 units/kg)  May consider starting one injection basal insulin at home in addition to his oral diabetes meds (Glipizide and Metformin)  Please also make sure to Prescribe CBG meter at time of discharge (pt does not have meter at home) Order [id number]    Addendum 11am--Met w/ pt this AM at bedside.  Showed pt the insulin pen and he is still open and willing to use insulin at home if needed to help heal his face and get better CBG control.  Reviewed all steps of insulin pen including attachment of needle, 2-unit air shot, dialing up dose, giving injection, rotation of injection sites, removing needle, disposal of sharps, storage of unused insulin, disposal of insulin etc.  Patient able to provide  successful return demonstration.  Reviewed troubleshooting with insulin pen.  Also reviewed Signs/Symptoms of Hypoglycemia with patient and how to treat Hypoglycemia at home.  Have asked RNs caring for patient to please allow patient to give all injections here in hospital as much as possible for practice.  MD to give patient Rxs for insulin pens and insulin pen needles.        --Will follow patient during hospitalization--  Wyn Quaker RN, MSN, CDE Diabetes Coordinator Inpatient Glycemic Control Team Team Pager: [phone] (8a-5p)

## 2021-08-11 NOTE — Progress Notes (Signed)
Patient transported to the OR 

## 2021-08-12 LAB — GLUCOSE, CAPILLARY: Glucose-Capillary: 206 mg/dL — ABNORMAL HIGH (ref 70–99)

## 2021-08-12 MED ORDER — INSULIN GLARGINE-YFGN 100 UNIT/ML ~~LOC~~ SOLN
22.0000 [IU] | Freq: Every day | SUBCUTANEOUS | Status: DC
  Administered 2021-08-12: 22 [IU] via SUBCUTANEOUS
  Filled 2021-08-12: qty 0.22

## 2021-08-12 MED ORDER — HYDROCODONE-ACETAMINOPHEN 5-325 MG PO TABS: 1.0000 | ORAL_TABLET | Freq: Four times a day (QID) | ORAL | 0 refills | Status: AC | PRN

## 2021-08-12 MED ORDER — CIPROFLOXACIN HCL 500 MG PO TABS: 500.0000 mg | ORAL_TABLET | Freq: Two times a day (BID) | ORAL | 0 refills | Status: AC

## 2021-08-12 MED ORDER — BLOOD GLUCOSE MONITOR KIT: PACK | 0 refills | Status: DC

## 2021-08-12 MED ORDER — GENTAMICIN SULFATE 0.1 % EX OINT
TOPICAL_OINTMENT | Freq: Three times a day (TID) | CUTANEOUS | Status: DC
  Filled 2021-08-12: qty 15

## 2021-08-12 MED ORDER — GENTAMICIN SULFATE 0.1 % EX OINT: TOPICAL_OINTMENT | Freq: Two times a day (BID) | CUTANEOUS | 0 refills | Status: DC

## 2021-08-12 MED ORDER — PEN NEEDLES 32G X 6 MM MISC: 1.0000 | Freq: Every day | 2 refills | Status: DC

## 2021-08-12 MED ORDER — INSULIN GLARGINE 100 UNIT/ML SOLOSTAR PEN: 20.0000 [IU] | PEN_INJECTOR | Freq: Every day | SUBCUTANEOUS | 2 refills | Status: DC

## 2021-08-12 MED ORDER — SODIUM CHLORIDE 0.9 % IV SOLN
INTRAVENOUS | Status: DC | PRN
Start: 2021-08-12 — End: 2021-08-12

## 2021-08-12 NOTE — Discharge Summary (Signed)
Physician Discharge Summary   Patient: Timothy [name]. MRN: [id number] DOB: 01/31/1961  Admit date:     08/08/2021  Discharge date: 08/17/21  Discharge Physician: Ezekiel Slocumb   PCP: Ellene Route   Recommendations at discharge:    Follow with ENT, Dr. Tami Ribas, in office on Monday Follow up with PCP in 1 week Follow up on glycemic control, pt started on insulin during admission Follow up on resolution of facial cellulitis and abscess  Discharge Diagnoses: Principal Problem:   Facial abscess Active Problems:   Facial cellulitis   Uncontrolled type 2 diabetes mellitus with hyperglycemia ([place])   Hyponatremia   Essential hypertension   COPD (chronic obstructive pulmonary disease) ([place])   Current smoker    Hospital Course: Timothy [name]. is a 61 y.o. male with medical history significant of DMT2, COPD, HTN who presents with left-sided facial pain and swelling that started 3 to 4 days ago.  He thought initially had a small pimple and he tried to squeeze on the left side of his face, denies injury or bug bite.  Admitted for IV antibiotics with ENT consulted for facial cellulitis with abscess.    Assessment and Plan: * Facial abscess ENT following, appreciate recommendations. Bedside I&D but ENT morning of 2/23. ENT plans for I&D under anesthesia in the OR later today. Initially on empiric IV vancomycin. Cultures grew MSSA, transitioned to Ancef. ENT recommends d/c on PO Cipro 500 mg BID x 2 weeks, and topical gentamicin BID.   Follow up Monday morning with ENT in clinic.  Patient has been afebrile, clinically improving and stable for discharge home with close ENT follow up.  Facial cellulitis See facial abscess plan  Uncontrolled type 2 diabetes mellitus with hyperglycemia (HCC) A1c 11.9%.  Patient was recently resumed on metformin and glipizide by PCP.  Required insulin drip in the ICU, weaned off morning of 2/23.  -- Diabetes coordinator following,  appreciate recommendations -- Discharging with 20 units insulin glargine daily -- Covered here with sliding scale NovoLog -- Resume prior oral metformin and glipizide at discharge, with basal insulin -- Close PCP follow up.  May require addition of mealtime insulin, reassess.  Hyponatremia- (present on admission) Initial sodium 125, likely pseudohyponatremia in the setting of hyperglycemia with glucose above 700. Sodium near normal 133 today. -- Monitor BMP  Essential hypertension- (present on admission) Continue with norvasc, clonidine, metoprolol. Monitor BP  COPD (chronic obstructive pulmonary disease) (HCC) Stable, not acutely exacerbated. Continue trelegy elliptica daily.  Duoneb q 6 hour as needed.   Current smoker- (present on admission) Nicotine patch ordered. Counseled and encourage to stop smoking.           Consultants: ENT Procedures performed: I&D of facial abscess Disposition: Home Diet recommendation:  Discharge Diet Orders (From admission, onward)     Start     Ordered   08/12/21 0000  Diet - low sodium heart healthy        08/12/21 1020           Cardiac and Carb modified diet  DISCHARGE MEDICATION: Allergies as of 08/12/2021       Reactions   Bee Venom Anaphylaxis   Cheese Anaphylaxis, Swelling, Other (See Comments)   Blue Cheese only   Coffea Arabica Shortness Of Breath   Coffee Flavor Shortness Of Breath        Medication List     TAKE these medications    acetaminophen 650 MG CR tablet Commonly known as: TYLENOL  Take 1,300 mg by mouth every 8 (eight) hours as needed for pain.   albuterol 108 (90 Base) MCG/ACT inhaler Commonly known as: Ventolin HFA INHALE 1 PUFF INTO THE LUNGS ONCE EVERY 6 HOURS AS NEEDED FOR SHORTNESS OF BREATH OR WHEEZING.   amLODipine 5 MG tablet Commonly known as: Norvasc Take 1 tablet (5 mg total) by mouth once daily.   ARIPiprazole 10 MG tablet Commonly known as: ABILIFY TAKE ONE TABLET (10MG  TOTAL) BY MOUTH ONCE DAILY AT BEDTIME.   blood glucose meter kit and supplies Kit Dispense based on patient and insurance preference. Use up to four times daily as directed. (FOR ICD-9 250.00, 250.01). What changed: Another medication with the same name was added. Make sure you understand how and when to take each.   blood glucose meter kit and supplies Kit Dispense based on patient and insurance preference. Use up to four times daily as directed. What changed: You were already taking a medication with the same name, and this prescription was added. Make sure you understand how and when to take each.   ciprofloxacin 500 MG tablet Commonly known as: Cipro Take 1 tablet (500 mg total) by mouth 2 (two) times daily for 14 days.   cloNIDine 0.1 MG tablet Commonly known as: CATAPRES Take one tablet ( 0.33m total) by mouth every day at bedtime.   Eliquis 5 MG Tabs tablet Generic drug: apixaban TAKE ONE TABLET (5MG TOTAL) BY MOUTH TWICE DAILY.   gabapentin 400 MG capsule Commonly known as: NEURONTIN TAKE ONE CAPSULE (400MG TOTAL) BY MOUTH THREE TIMES DAILY.   gentamicin ointment 0.1 % Commonly known as: GARAMYCIN Apply topically in the morning and at bedtime. To left side of face.   glipiZIDE 10 MG tablet Commonly known as: GLUCOTROL TAKE ONE TABLET (10MG TOTAL) BY MOUTH TWICE DAILY.   HYDROcodone-acetaminophen 5-325 MG tablet Commonly known as: NORCO/VICODIN Take 1-2 tablets by mouth every 6 (six) hours as needed for up to 5 days for moderate pain or severe pain.   magnesium oxide 400 MG tablet Commonly known as: MAG-OX Take 800 mg by mouth 2 (two) times daily.   metFORMIN 1000 MG tablet Commonly known as: GLUCOPHAGE TAKE ONE TABLET (1,000MG TOTAL) BY MOUTH TWICE DAILY.   metoprolol succinate 50 MG 24 hr tablet Commonly known as: TOPROL-XL Take 1 tablet by mouth daily. What changed: Another medication with the same name was removed. Continue taking this medication, and  follow the directions you see here.   pantoprazole 40 MG tablet Commonly known as: PROTONIX TAKE ONE TABLET (40MG TOTAL) BY MOUTH ONCE DAILY.   Pen Needles 32G X 6 MM Misc 1 each by Does not apply route daily.   rosuvastatin 5 MG tablet Commonly known as: Crestor TAKE ONE TABLET (5MG TOTAL) BY MOUTH ONCE DAILY.   Stiolto Respimat 2.5-2.5 MCG/ACT Aers Generic drug: Tiotropium Bromide-Olodaterol Inhale 2 sprays into the lungs daily.   Trelegy Ellipta 100-62.5-25 MCG/ACT Aepb Generic drug: Fluticasone-Umeclidin-Vilant Inhale 100 mcg into the lungs daily.   venlafaxine XR 37.5 MG 24 hr capsule Commonly known as: Effexor XR TAKE 2 CAPSULES (75MG TOTAL) BY MOUTH ONCE DAILY.               Discharge Care Instructions  (From admission, onward)           Start     Ordered   08/12/21 0000  Discharge wound care:       Comments: Change gauze and tegaderm as needed.  Please do not  pull out iodoform gauze packed in the wound. Apply topical gentamicin to area twice daily as instructed.   08/12/21 1020             Discharge Exam: Filed Weights   08/08/21 1501 08/09/21 1709 08/11/21 1538  Weight: 85.9 kg 93.1 kg 93.1 kg   General exam: awake, alert, no acute distress HEENT: left facial erythema and swelling improved, clean dry intact bandaid over abscess site moist mucus membranes, hearing grossly normal  Respiratory system: CTAB no wheezes, rales or rhonchi, normal respiratory effort. Cardiovascular system: normal S1/S2, RRR, no pedal edema.   Central nervous system: A&O x3. no gross focal neurologic deficits, normal speech Extremities: moves all, no edema, normal tone Skin: dry, intact, normal temperature Psychiatry: normal mood, congruent affect, judgement and insight appear normal   Condition at discharge: stable  The results of significant diagnostics from this hospitalization (including imaging, microbiology, ancillary and laboratory) are listed below for  reference.   Imaging Studies: CT HEAD WO CONTRAST (5MM)  Result Date: 08/08/2021 CLINICAL DATA:  Facial abscess. Unable to open left eye. EXAM: CT HEAD WITHOUT CONTRAST TECHNIQUE: Contiguous axial images were obtained from the base of the skull through the vertex without intravenous contrast. RADIATION DOSE REDUCTION: This exam was performed according to the departmental dose-optimization program which includes automated exposure control, adjustment of the mA and/or kV according to patient size and/or use of iterative reconstruction technique. COMPARISON:  None. FINDINGS: Brain: No acute infarct, hemorrhage, or mass lesion is present. The ventricles are of normal size. No significant extraaxial fluid collection is present. The brainstem and cerebellum are within normal limits. Vascular: Minimal atherosclerotic changes are present within the cavernous internal carotid arteries bilaterally. No hyperdense vessel is present. Skull: Calvarium is intact. No focal lytic or blastic lesions are present. CT maxillofacial CT report for periorbital findings. Sinuses: Circumferential mucosal thickening is present in the right sphenoid sinus. Small fluid level is present. The paranasal sinuses and mastoid air cells are otherwise clear. IMPRESSION: 1. Normal CT appearance of the brain. 2. Right sphenoid sinus disease. Electronically Signed   By: San Morelle M.D.   On: 08/08/2021 15:45   CT Maxillofacial W Contrast  Result Date: 08/08/2021 CLINICAL DATA:  Left facial swelling. Abscess. Unable to open left eye. EXAM: CT MAXILLOFACIAL WITH CONTRAST TECHNIQUE: Multidetector CT imaging of the maxillofacial structures was performed with intravenous contrast. Multiplanar CT image reconstructions were also generated. RADIATION DOSE REDUCTION: This exam was performed according to the departmental dose-optimization program which includes automated exposure control, adjustment of the mA and/or kV according to patient size  and/or use of iterative reconstruction technique. CONTRAST:  12m OMNIPAQUE IOHEXOL 300 MG/ML  SOLN COMPARISON:  CT head without contrast FINDINGS: Osseous: Mandible is intact. Periapical lucencies are present about the residual right mandibular molars with large dental caries. No other focal osseous lesions are present. Orbits: Extensive left periorbital soft tissue swelling is present. Marked soft swelling is present over the left side of the face near the ligament arch. Soft tissue swelling is superficial just below the skin surface. Area of increased density and enhancement measures 4.2 x 3.8 x 1.8 cm definite drainable fluid is present. The soft swelling extends to the orbit and into both the upper and lower eyelids, more prominently laterally. No postseptal inflammatory changes are present. Globe is within normal limits. Sinuses: Fluid level is present in the right sphenoid sinus. The paranasal sinuses and mastoid air cells are otherwise clear. Soft tissues: Inflammatory changes extend  superiorly along the temporal os muscle and inferiorly along the left side of face and neck. No other discrete collections are present. Limited intracranial: Unremarkable IMPRESSION: 1. Extensive left periorbital soft tissue swelling compatible with cellulitis. 2. 4.2 x 3.8 x 1.8 cm area of increased density and enhancement over the left side of the face near the ligament arch is concerning for a developing abscess. 3. Inflammatory changes extend superiorly along the temporal os muscle and inferiorly along the left side of the face and neck. 4. No postseptal inflammatory changes are present within the left orbit. 5. Periapical lucencies about the residual right mandibular molars with large dental caries. Electronically Signed   By: San Morelle M.D.   On: 08/08/2021 15:52    Microbiology: Results for orders placed or performed during the hospital encounter of 08/08/21  Aerobic/Anaerobic Culture w Gram Stain  (surgical/deep wound)     Status: None   Collection Time: 08/08/21  5:14 PM   Specimen: Face; Abscess  Result Value Ref Range Status   Specimen Description   Final    FACE Performed at [facility], [address], [place], [place] [address]    Special Requests   Final    Normal Performed at [facility], [place], [place] [address]    Gram Stain   Final    FEW WBC PRESENT, PREDOMINANTLY PMN RARE GRAM POSITIVE COCCI IN PAIRS    Culture   Final    FEW STAPHYLOCOCCUS AUREUS NO ANAEROBES ISOLATED Performed at [facility], [place] [address] [place], [place] [address]    Report Status 08/13/2021 FINAL  Final   Organism ID, Bacteria STAPHYLOCOCCUS AUREUS  Final      Susceptibility   Staphylococcus aureus - MIC*    CIPROFLOXACIN <=0.5 SENSITIVE Sensitive     ERYTHROMYCIN >=8 RESISTANT Resistant     GENTAMICIN <=0.5 SENSITIVE Sensitive     OXACILLIN 0.5 SENSITIVE Sensitive     TETRACYCLINE <=1 SENSITIVE Sensitive     VANCOMYCIN <=0.5 SENSITIVE Sensitive     TRIMETH/SULFA <=10 SENSITIVE Sensitive     CLINDAMYCIN <=0.25 SENSITIVE Sensitive     RIFAMPIN <=0.5 SENSITIVE Sensitive     Inducible Clindamycin NEGATIVE Sensitive     * FEW STAPHYLOCOCCUS AUREUS  Resp Panel by RT-PCR (Flu A&B, Covid) Nasopharyngeal Swab     Status: None   Collection Time: 08/08/21  5:14 PM   Specimen: Nasopharyngeal Swab; Nasopharyngeal(NP) swabs in vial transport medium  Result Value Ref Range Status   SARS Coronavirus 2 by RT PCR NEGATIVE NEGATIVE Final    Comment: (NOTE) SARS-CoV-2 target nucleic acids are NOT DETECTED.  The SARS-CoV-2 RNA is generally detectable in upper respiratory specimens during the acute phase of infection. The lowest concentration of SARS-CoV-2 viral copies this assay can detect is 138 copies/mL. A negative result does not preclude SARS-Cov-2 infection and should not be used as the sole basis for treatment or other patient management  decisions. A negative result may occur with  improper specimen collection/handling, submission of specimen other than nasopharyngeal swab, presence of viral mutation(s) within the areas targeted by this assay, and inadequate number of viral copies(<138 copies/mL). A negative result must be combined with clinical observations, patient history, and epidemiological information. The expected result is Negative.  Fact Sheet for Patients:  EntrepreneurPulse.com.au  Fact Sheet for Healthcare Providers:  IncredibleEmployment.be  This test is no t yet approved or cleared by the [place] and  has been authorized for  detection and/or diagnosis of SARS-CoV-2 by FDA under an Emergency Use Authorization (EUA). This EUA will remain  in effect (meaning this test can be used) for the duration of the COVID-19 declaration under Section 564(b)(1) of the Act, 21 U.S.C.section 360bbb-3(b)(1), unless the authorization is terminated  or revoked sooner.       Influenza A by PCR NEGATIVE NEGATIVE Final   Influenza B by PCR NEGATIVE NEGATIVE Final    Comment: (NOTE) The Xpert Xpress SARS-CoV-2/FLU/RSV plus assay is intended as an aid in the diagnosis of influenza from Nasopharyngeal swab specimens and should not be used as a sole basis for treatment. Nasal washings and aspirates are unacceptable for Xpert Xpress SARS-CoV-2/FLU/RSV testing.  Fact Sheet for Patients: EntrepreneurPulse.com.au  Fact Sheet for Healthcare Providers: IncredibleEmployment.be  This test is not yet approved or cleared by the [place] FDA and has been authorized for detection and/or diagnosis of SARS-CoV-2 by FDA under an Emergency Use Authorization (EUA). This EUA will remain in effect (meaning this test can be used) for the duration of the COVID-19 declaration under Section 564(b)(1) of the Act, 21 U.S.C. section 360bbb-3(b)(1), unless the  authorization is terminated or revoked.  Performed at [facility], [address] [place], [place] [address]   MRSA Next Gen by PCR, Nasal     Status: None   Collection Time: 08/08/21  7:51 PM   Specimen: Nasal Mucosa; Nasal Swab  Result Value Ref Range Status   MRSA by PCR Next Gen NOT DETECTED NOT DETECTED Final    Comment: (NOTE) The GeneXpert MRSA Assay (FDA approved for NASAL specimens only), is one component of a comprehensive MRSA colonization surveillance program. It is not intended to diagnose MRSA infection nor to guide or monitor treatment for MRSA infections. Test performance is not FDA approved in patients less than 61 years old. Performed at [facility], [address] [place], [place] [address]   Aerobic/Anaerobic Culture w Gram Stain (surgical/deep wound)     Status: None   Collection Time: 08/11/21  4:51 PM   Specimen: PATH Other; Abscess  Result Value Ref Range Status   Specimen Description   Final    WOUND Performed at [facility], [address], [place], [place] [address]    Special Requests   Final    ABSCESS Performed at [facility], [address] [place], [place] [address]    Gram Stain   Final    NO SQUAMOUS EPITHELIAL CELLS SEEN FEW WBC SEEN FEW GRAM POSITIVE COCCI    Culture   Final    ABUNDANT STAPHYLOCOCCUS AUREUS NO ANAEROBES ISOLATED Performed at [facility], [place] [address] [place], [place] [address]    Report Status 08/16/2021 FINAL  Final   Organism ID, Bacteria STAPHYLOCOCCUS AUREUS  Final      Susceptibility   Staphylococcus aureus - MIC*    CIPROFLOXACIN <=0.5 SENSITIVE Sensitive     ERYTHROMYCIN >=8 RESISTANT Resistant     GENTAMICIN <=0.5 SENSITIVE Sensitive     OXACILLIN <=0.25 SENSITIVE Sensitive     TETRACYCLINE <=1 SENSITIVE Sensitive     VANCOMYCIN 1 SENSITIVE Sensitive     TRIMETH/SULFA <=10 SENSITIVE Sensitive     CLINDAMYCIN <=0.25 SENSITIVE Sensitive      RIFAMPIN <=0.5 SENSITIVE Sensitive     Inducible Clindamycin NEGATIVE Sensitive     * ABUNDANT STAPHYLOCOCCUS AUREUS    Labs: CBC: Recent Labs  Lab 08/11/21 0459  WBC 7.6  HGB 12.9*  HCT  37.8*  MCV 93.1  PLT 536   Basic Metabolic Panel: Recent Labs  Lab 08/11/21 0459  NA 133*  K 3.8  CL 96*  CO2 28  GLUCOSE 291*  BUN 17  CREATININE 0.88  CALCIUM 8.3*   Liver Function Tests: No results for input(s): AST, ALT, ALKPHOS, BILITOT, PROT, ALBUMIN in the last 168 hours. CBG: Recent Labs  Lab 08/11/21 1209 08/11/21 1531 08/11/21 1711 08/11/21 2111 08/12/21 0725  GLUCAP 321* 182* 168* 244* 206*    Discharge time spent: less than 30 minutes.  Signed: Ezekiel Slocumb, DO [facility] 08/17/2021

## 2021-08-12 NOTE — Progress Notes (Signed)
08/12/2021 9:45 AM  Linna Hoff [id number]  Post-Op Day 1    Temp:  [96.9 F (36.1 C)-98.7 F (37.1 C)] 98.4 F (36.9 C) (02/25 0724) Pulse Rate:  [64-76] 72 (02/25 0724) Resp:  [10-21] 20 (02/25 0724) BP: (103-166)/(58-99) 152/99 (02/25 0724) SpO2:  [94 %-98 %] 95 % (02/25 0724) Weight:  [93.1 kg] 93.1 kg (02/24 1538),     Intake/Output Summary (Last 24 hours) at 08/12/2021 0945 Last data filed at 08/12/2021 0500 Gross per 24 hour  Intake 960.33 ml  Output 400 ml  Net 560.33 ml    Results for orders placed or performed during the hospital encounter of 08/08/21 (from the past 24 hour(s))  Glucose, capillary     Status: Abnormal   Collection Time: 08/11/21 12:09 PM  Result Value Ref Range   Glucose-Capillary 321 (H) 70 - 99 mg/dL  Glucose, capillary     Status: Abnormal   Collection Time: 08/11/21  3:31 PM  Result Value Ref Range   Glucose-Capillary 182 (H) 70 - 99 mg/dL  Aerobic/Anaerobic Culture w Gram Stain (surgical/deep wound)     Status: None (Preliminary result)   Collection Time: 08/11/21  4:51 PM   Specimen: PATH Other; Abscess  Result Value Ref Range   Specimen Description      WOUND Performed at [facility], [address] [place], [place] [address]    Special Requests      ABSCESS Performed at [facility], [address] [place], [place] [address]    Gram Stain      NO SQUAMOUS EPITHELIAL CELLS SEEN FEW WBC SEEN FEW GRAM POSITIVE COCCI Performed at [facility], [address] [place], [place] [address]    Culture PENDING    Report Status PENDING   Glucose, capillary     Status: Abnormal   Collection Time: 08/11/21  5:11 PM  Result Value Ref Range   Glucose-Capillary 168 (H) 70 - 99 mg/dL  Glucose, capillary     Status: Abnormal   Collection Time: 08/11/21  9:11 PM  Result Value Ref Range   Glucose-Capillary 244 (H) 70 - 99 mg/dL  Glucose, capillary     Status: Abnormal   Collection Time: 08/12/21  7:25 AM   Result Value Ref Range   Glucose-Capillary 206 (H) 70 - 99 mg/dL    SUBJECTIVE: Patient is feeling better this morning soreness has improved no new complaint  OBJECTIVE: Dressing was changed, I advanced out the iodoform wick approximately 1 cm and trimmed.  There appears to be no reaccumulation of abscess erythema and swelling have decreased.  IMPRESSION: Status post I&D of facial abscess with placement of iodoform wick.  Patient's symptoms have significantly improved swelling and erythema have also improved.  I had a long discussion with the patient today about wound care.  I have left some supplies in his room for him to go home with.  He can change the dressing once to twice a day, and I have instructed him on how to advance the wick out approximately 1 cm each day, with care taken not to push the remaining wick into the wound.  If he is uncomfortable with this he is to leave this alone and I will see him Monday morning in the office for wound care and postop follow-up.  PLAN: From an ENT standpoint, I think the patient can be discharged home.  Would recommend discharged home on p.o. ciprofloxacin 500 twice daily for 2 weeks, and gentamicin ointment applied  to left facial wound twice daily.  I have given him my business card and instructed him on the address of our office he will follow-up Monday morning with me at approximately 8:30 AM.  If there are any further questions feel free to contact me.  Davina Poke 08/12/2021, 9:45 AM

## 2021-08-12 NOTE — Plan of Care (Signed)

## 2021-08-13 LAB — AEROBIC/ANAEROBIC CULTURE W GRAM STAIN (SURGICAL/DEEP WOUND): Special Requests: NORMAL

## 2021-08-14 ENCOUNTER — Other Ambulatory Visit: Payer: Self-pay | Admitting: Internal Medicine

## 2021-08-14 ENCOUNTER — Encounter: Payer: Self-pay | Admitting: Unknown Physician Specialty

## 2021-08-14 MED ORDER — INSULIN GLARGINE 100 UNIT/ML SOLOSTAR PEN: 20.0000 [IU] | PEN_INJECTOR | Freq: Every day | SUBCUTANEOUS | 3 refills | Status: DC

## 2021-08-14 NOTE — Progress Notes (Unsigned)
Lantus requires prior auth by his insurance. I sent generic insulin glargine pens to pharmacy which do  not require prior auth.

## 2021-08-16 LAB — AEROBIC/ANAEROBIC CULTURE W GRAM STAIN (SURGICAL/DEEP WOUND): Gram Stain: NONE SEEN

## 2021-08-22 NOTE — Anesthesia Postprocedure Evaluation (Signed)
Anesthesia Post Note ? ?Patient: Timothy [name]. ? ?Procedure(s) Performed: INCISION AND DRAINAGE ABSCESS (Left) ? ?Patient location during evaluation: PACU ?Anesthesia Type: MAC ?Level of consciousness: awake and alert ?Pain management: pain level controlled ?Vital Signs Assessment: post-procedure vital signs reviewed and stable ?Respiratory status: spontaneous breathing, nonlabored ventilation, respiratory function stable and patient connected to nasal cannula oxygen ?Cardiovascular status: blood pressure returned to baseline and stable ?Postop Assessment: no apparent nausea or vomiting ?Anesthetic complications: no ? ? ?No notable events documented. ? ? ?Last Vitals:  ?Vitals:  ? 08/12/21 0453 08/12/21 0724  ?BP: (!) 166/98 (!) 152/99  ?Pulse: 64 72  ?Resp: (!) 21 20  ?Temp: 36.5 ?C 36.9 ?C  ?SpO2: 94% 95%  ?  ?Last Pain:  ?Vitals:  ? 08/12/21 0840  ?TempSrc:   ?PainSc: 6   ? ? ?  ?  ?  ?  ?  ?  ? ?Molli Barrows ? ? ? ? ?

## 2021-08-25 ENCOUNTER — Telehealth: Payer: Self-pay | Admitting: *Deleted

## 2021-08-25 NOTE — Telephone Encounter (Signed)
Left message for pt to call to schedule f/u lung screening CT scan.  °

## 2021-08-30 ENCOUNTER — Other Ambulatory Visit: Payer: Self-pay

## 2021-08-30 DIAGNOSIS — F17210 Nicotine dependence, cigarettes, uncomplicated: Secondary | ICD-10-CM

## 2021-08-30 DIAGNOSIS — Z87891 Personal history of nicotine dependence: Secondary | ICD-10-CM

## 2021-09-05 ENCOUNTER — Encounter: Payer: Self-pay | Admitting: Unknown Physician Specialty

## 2021-09-07 ENCOUNTER — Encounter: Payer: Self-pay | Admitting: Pulmonary Disease

## 2021-09-07 ENCOUNTER — Ambulatory Visit (INDEPENDENT_AMBULATORY_CARE_PROVIDER_SITE_OTHER): Payer: Medicaid Other | Admitting: Pulmonary Disease

## 2021-09-07 ENCOUNTER — Other Ambulatory Visit: Payer: Self-pay

## 2021-09-07 VITALS — BP 120/84 | HR 85 | Temp 97.0°F | Ht 70.0 in | Wt 200.6 lb

## 2021-09-07 DIAGNOSIS — J449 Chronic obstructive pulmonary disease, unspecified: Secondary | ICD-10-CM | POA: Diagnosis not present

## 2021-09-07 DIAGNOSIS — J9611 Chronic respiratory failure with hypoxia: Secondary | ICD-10-CM

## 2021-09-07 MED ORDER — TRELEGY ELLIPTA 100-62.5-25 MCG/ACT IN AEPB: 100.0000 ug | INHALATION_SPRAY | Freq: Every day | RESPIRATORY_TRACT | 0 refills | Status: DC

## 2021-09-07 MED ORDER — TRELEGY ELLIPTA 100-62.5-25 MCG/ACT IN AEPB: 1.0000 | INHALATION_SPRAY | Freq: Every day | RESPIRATORY_TRACT | 11 refills | Status: DC

## 2021-09-07 NOTE — Patient Instructions (Signed)
We sent a prescription for Trelegy to your pharmacy.  We also received a sample today. ? ?Stop the Stiolto. ? ?We will see you in follow-up in 4 months time call sooner should any new problems arise. ? ?

## 2021-09-07 NOTE — Progress Notes (Signed)
Subjective:    Patient ID: Timothy [name]., male    DOB: Dec 31, 1960, 61 y.o.   MRN: [id number]  Patient Care Team: Ellene Route as PCP - General Kate Sable, MD as PCP - Cardiology (Cardiology)  Chief Complaint  Patient presents with   Follow-up    HPI Barry is a 61 year old current smoker 1 PPD/day 28 PY) who presents for follow-up of COPD and dyspnea.  This is a scheduled appointment.  We last saw him on 06 July 2021.  At that time was noted to be doing well with his oxygen supplementation.  Recall he had an episode of empyema with Streptococcus imitis which required VATS.  This was in February 2022.  He was treated at [facility].  He has not had any recurrent symptoms of empyema.  He does have some persistent pain postoperatively but is getting better.  Also notes persistent mild dyspnea but at baseline.  He is now back to using Trelegy and feels that this is doing well for him.  Previously he thought the Trelegy was responsible for his empyema however he had started the medication only a few days prior to his issues developing.  He is still using oxygen at 2 to 3 L/min with exertion.  Most recently he was admitted to [facility] with a facial abscess.  He was admitted from 21 February through 2 March.  He was discharged on Stiolto but states he prefers Trelegy.  He has completed antibiotic therapy.  He had I&D by ENT.  He has not had recurrence of this issue since he completed his therapy.  He did not have a COPD exacerbation throughout this.  DATA: 06/16/2020 PFT: FEV1 1.54 L or 42% predicted, FVC 2.54 L or 52% predicted, FEV1/FVC 61%, there is bronchodilator response with net change of 14% on FEV1 postbronchodilator.  There is concomitant restrictive physiology.  There is fluttering of the flow-volume loop consistent with possible OSA.  Diffusion capacity moderately reduced. 06/17/2019 one 2D echo: LVEF 60 to 65%, mildly dilated LA, no diastolic dysfunction. 08/21/2020  chest CT: Residual left empyema/pleural rind. 10/25/2020 nuclear stress test: Normal, no evidence of ischemia 05/30/2021 PFTs: FEV1 2.40 L or 65% predicted, FVC 2.87 L or 59% predicted, FEV1/FVC 84%.  Mild air trapping noted, diffusion capacity reduced.  Flow volume loop delayed and narrow.  This is consistent with a mixed obstructive/restrictive defect.  Restriction could be due to prior history of empyema and resultant pleural scarring, because of the restrictive defect the obstructive defect can be underestimated.   Review of Systems A 10 point review of systems was performed and it is as noted above otherwise negative.  Patient Active Problem List   Diagnosis Date Noted   Fall 06/27/2022   Bipolar disorder with psychotic features ([place]) 05/28/2022   Generalized anxiety disorder 05/08/2022   Facial abscess 08/08/2021   Facial cellulitis 08/08/2021   Uncontrolled type 2 diabetes mellitus with hyperglycemia ([place]) 08/08/2021   COPD (chronic obstructive pulmonary disease) ([place]) 08/08/2021   Atherosclerosis of native arteries of the extremities with ulceration ([place]) 04/18/2021   DJD (degenerative joint disease), lumbosacral 04/18/2021   Leg pain 01/23/2021   Acute deep vein thrombosis (DVT) of left lower extremity ([place]) 01/23/2021   Atherosclerosis of native arteries of extremity with intermittent claudication ([place]) 01/22/2021   MDD (major depressive disorder), recurrent severe, without psychosis ([place]) 12/19/2020   Severe episode of recurrent major depressive disorder, with psychotic features ([place])    COPD with acute exacerbation ([place]) 12/14/2020  Hx of sleep apnea 12/14/2020   Empyema lung ([place]) 08/15/2020   Type 2 diabetes mellitus with hyperglycemia ([place]) 08/11/2020   Respiratory failure with hypoxia ([place]) 08/11/2020   Hyponatremia 08/11/2020   Chronic diastolic CHF (congestive heart failure) ([place]) 08/11/2020   Anemia 08/11/2020   Abdominal pain 08/11/2020   Empyema ([place]) 08/10/2020    Osteomyelitis ([place]) 03/15/2020   Open wound of foot 01/20/2020   Elevated lipids 01/20/2020   Personal history of colonic polyps    Shortness of breath 11/04/2019   Encounter to establish care 10/15/2019   Essential hypertension 10/15/2019   Controlled diabetes mellitus type 2 with complications ([place]) 07/62/2633   Current smoker 10/15/2019   Peripheral neuropathy 10/15/2019   Cramp, abdominal 10/15/2019   History of hepatitis 10/15/2019   History of gastroesophageal reflux (GERD) 10/15/2019   Type 2 diabetes mellitus with diabetic neuropathy, unspecified ([place]) 10/15/2019   Post-traumatic stress disorder, unspecified 07/14/2019   Antisocial personality disorder ([place]) 07/14/2019   Social History   Tobacco Use   Smoking status: Every Day    Packs/day: 1.00    Years: 41.00    Total pack years: 41.00    Types: Cigarettes   Smokeless tobacco: Never   Tobacco comments:    1 pack a day reported but is trying to cut back to stop  Substance Use Topics   Alcohol use: Not Currently    Alcohol/week: 6.0 standard drinks of alcohol    Types: 6 Cans of beer per week    Comment: 1-2 times weekly; 4 "40s"   Allergies  Allergen Reactions   Bee Venom Anaphylaxis   Cheese Anaphylaxis, Swelling and Other (See Comments)    Blue Cheese only   Coffea Arabica Shortness Of Breath   Coffee Flavor Shortness Of Breath    Current Meds  Medication Sig   acetaminophen (TYLENOL) 650 MG CR tablet Take 1,300 mg by mouth every 8 (eight) hours as needed for pain.   albuterol (VENTOLIN HFA) 108 (90 Base) MCG/ACT inhaler INHALE 1 PUFF INTO THE LUNGS ONCE EVERY 6 HOURS AS NEEDED FOR SHORTNESS OF BREATH OR WHEEZING.   amLODipine (NORVASC) 5 MG tablet Take 1 tablet (5 mg total) by mouth once daily.   apixaban (ELIQUIS) 5 MG TABS tablet TAKE ONE TABLET (5MG  TOTAL) BY MOUTH TWICE DAILY.   ARIPiprazole (ABILIFY) 10 MG tablet TAKE ONE TABLET (10MG  TOTAL) BY MOUTH ONCE DAILY AT BEDTIME.   blood glucose meter  kit and supplies KIT Dispense based on patient and insurance preference. Use up to four times daily as directed. (FOR ICD-9 250.00, 250.01).   blood glucose meter kit and supplies KIT Dispense based on patient and insurance preference. Use up to four times daily as directed.   cloNIDine (CATAPRES) 0.1 MG tablet Take one tablet ( 0.1mg  total) by mouth every day at bedtime.   gabapentin (NEURONTIN) 400 MG capsule TAKE ONE CAPSULE (400MG  TOTAL) BY MOUTH THREE TIMES DAILY.   gentamicin ointment (GARAMYCIN) 0.1 % Apply topically in the morning and at bedtime. To left side of face.   glipiZIDE (GLUCOTROL) 10 MG tablet TAKE ONE TABLET (10MG  TOTAL) BY MOUTH TWICE DAILY.   insulin glargine (LANTUS) 100 UNIT/ML Solostar Pen Inject 20 Units into the skin at bedtime.   Insulin Pen Needle (PEN NEEDLES) 32G X 6 MM MISC 1 each by Does not apply route daily.   magnesium oxide (MAG-OX) 400 MG tablet Take 800 mg by mouth 2 (two) times daily.   metFORMIN (GLUCOPHAGE) 1000 MG  tablet TAKE ONE TABLET (1,000MG  TOTAL) BY MOUTH TWICE DAILY.   metoprolol succinate (TOPROL-XL) 50 MG 24 hr tablet Take 1 tablet by mouth daily.   pantoprazole (PROTONIX) 40 MG tablet TAKE ONE TABLET (40MG  TOTAL) BY MOUTH ONCE DAILY.   rosuvastatin (CRESTOR) 5 MG tablet TAKE ONE TABLET (5MG  TOTAL) BY MOUTH ONCE DAILY.   STIOLTO RESPIMAT 2.5-2.5 MCG/ACT AERS Inhale 2 sprays into the lungs daily.   venlafaxine XR (EFFEXOR XR) 37.5 MG 24 hr capsule TAKE 2 CAPSULES (75MG  TOTAL) BY MOUTH ONCE DAILY.   [DISCONTINUED] citalopram (CELEXA) 20 MG tablet Take 1 tablet (20 mg total) by mouth daily. (Patient taking differently: Take 20 mg by mouth at bedtime.)       Objective:   Physical Exam BP 120/84 (BP Location: Left Arm, Patient Position: Sitting, Cuff Size: Normal)   Pulse 85   Temp (!) 97 F (36.1 C) (Oral)   Ht 5\' 10"  (1.778 m)   Wt 200 lb 9.6 oz (91 kg)   SpO2 94%   BMI 28.78 kg/m   SpO2: 94 % O2 Device: None (Room air)  GENERAL:  Well-developed well-nourished gentleman in no acute distress.  Fully ambulatory.  Conversational dyspnea.  Notable on nasal cannula O2. HEAD: Normocephalic, atraumatic.  Well-healing scar on left cheek from abscess drainage. EYES: Pupils equal, round, reactive to light.  No scleral icterus. Injected conjunctiva. MOUTH: Nose/mouth/throat not examined due to masking requirements for COVID 19. NECK: Supple. No thyromegaly. Trachea midline. No JVD.  No adenopathy. PULMONARY: Good air entry symmetrically bilaterally.Coarse breath sounds with no other adventitious sounds. CARDIOVASCULAR: S1 and S2. Regular rate and rhythm.  No rubs, murmurs or gallops heard. ABDOMEN: Benign. MUSCULOSKELETAL: No clubbing, no edema.   NEUROLOGIC: No focal deficit, speech is fluent, SKIN: Intact,warm,dry. PSYCH: Mood and behavior      Assessment & Plan:     ICD-10-CM   1. Stage 3 severe COPD by GOLD classification ([place])  J44.9    Resume Trelegy Ellipta Discontinue Stiolto Continue as needed albuterol    2. Chronic respiratory failure with hypoxia (HCC)  J96.11    O2 at 3 L/min with exertion  O2 at 2 L/min with sleep Patient compliant with therapy     Meds ordered this encounter  Medications   Fluticasone-Umeclidin-Vilant (TRELEGY ELLIPTA) 100-62.5-25 MCG/ACT AEPB    Sig: Inhale 1 puff into the lungs daily.    Dispense:  28 each    Refill:  11   Fluticasone-Umeclidin-Vilant (TRELEGY ELLIPTA) 100-62.5-25 MCG/ACT AEPB    Sig: Inhale 100 mcg into the lungs daily.    Dispense:  28 each    Refill:  0    Order Specific Question:   Lot Number?    Answer:   [id number]    Order Specific Question:   Expiration Date?    Answer:   03/18/2023    Order Specific Question:   Quantity    Answer:   1   Will see the patient in follow-up in 4 months time call sooner should any new problems arise.  Renold Don, MD Advanced Bronchoscopy PCCM [facility]    *This note was dictated using voice  recognition software/Dragon.  Despite best efforts to proofread, errors can occur which can change the meaning. Any transcriptional errors that result from this process are unintentional and may not be fully corrected at the time of dictation.

## 2021-09-18 ENCOUNTER — Ambulatory Visit
Admission: RE | Admit: 2021-09-18 | Discharge: 2021-09-18 | Disposition: A | Discharge status: Home / Self Care | Payer: Medicaid Other | Source: Ambulatory Visit | Attending: Acute Care | Admitting: Acute Care

## 2021-09-18 ENCOUNTER — Other Ambulatory Visit: Payer: Self-pay

## 2021-09-18 DIAGNOSIS — F17210 Nicotine dependence, cigarettes, uncomplicated: Secondary | ICD-10-CM | POA: Insufficient documentation

## 2021-09-18 DIAGNOSIS — Z87891 Personal history of nicotine dependence: Secondary | ICD-10-CM | POA: Diagnosis present

## 2021-09-21 ENCOUNTER — Telehealth: Payer: Self-pay | Admitting: Acute Care

## 2021-09-21 NOTE — Telephone Encounter (Signed)
I have called the patient with the results of his low dose CT Chest. Thre was on answer. I have left a HIPPA compliant message requesting the patient call the office so we can review the results with him.  ? ?Timothy [name], his scan is a 4 A. I have reviewed it with Dr. Darnell Level, and we will be doing a 3 month follow up.  ?Please place order for scan and fax results to PCP, let them know plan is for a 3 month follow up. Thanks so much.  ?

## 2021-09-25 NOTE — Telephone Encounter (Signed)
Pt returned call and left a VM. I attempted to contact pt. Left a detailed message for pt to call back to discuss CT results.  ?

## 2021-09-27 ENCOUNTER — Telehealth: Payer: Self-pay | Admitting: Acute Care

## 2021-09-27 NOTE — Telephone Encounter (Signed)
I have attempted to call the patient with the results of his low dose CT Chest again today. He had called the office and left a call back number on our VM. There was no answer when I returned the call.  I have left a HIPPA compliant message on his VM, asking him to call the office for his results, and I have also asked him for the best time to call him with his results. He needs a 3 month follow up LDCT, which will be due 12/18/2021. We will continue to attempt to contact the patient.  ?

## 2021-09-28 ENCOUNTER — Telehealth: Payer: Self-pay | Admitting: Acute Care

## 2021-09-28 NOTE — Telephone Encounter (Signed)
I have called the patient again to share results of his low dose CT Chest. . Again, there was no answer. I have left a HIPPA compliant message on the patient's VM asking that he call the office for the results of his scan. I left the office contact number.  ? ?Angelique Blonder, please send a letter and ask the patient to call the office for results . ?He needs a 3 month follow up. Thanks so much ?

## 2021-09-29 ENCOUNTER — Encounter: Payer: Self-pay | Admitting: *Deleted

## 2021-09-29 NOTE — Telephone Encounter (Signed)
Left another message for pt to call back to discuss recent Chest CT results. Also, mailed letter to pt asking him to contact the office to discuss.  ?

## 2021-09-29 NOTE — Telephone Encounter (Signed)
See other telephone note 09/28/2021 ?

## 2021-10-02 ENCOUNTER — Encounter: Payer: Self-pay | Admitting: Cardiology

## 2021-10-02 ENCOUNTER — Ambulatory Visit (INDEPENDENT_AMBULATORY_CARE_PROVIDER_SITE_OTHER): Payer: Medicaid Other | Admitting: Cardiology

## 2021-10-02 VITALS — BP 120/66 | HR 74 | Ht 70.0 in | Wt 206.0 lb

## 2021-10-02 DIAGNOSIS — F17200 Nicotine dependence, unspecified, uncomplicated: Secondary | ICD-10-CM

## 2021-10-02 DIAGNOSIS — I10 Essential (primary) hypertension: Secondary | ICD-10-CM | POA: Diagnosis not present

## 2021-10-02 DIAGNOSIS — IMO0001 Reserved for inherently not codable concepts without codable children: Secondary | ICD-10-CM

## 2021-10-02 DIAGNOSIS — I2510 Atherosclerotic heart disease of native coronary artery without angina pectoris: Secondary | ICD-10-CM | POA: Diagnosis not present

## 2021-10-02 DIAGNOSIS — I471 Supraventricular tachycardia, unspecified: Secondary | ICD-10-CM

## 2021-10-02 NOTE — Patient Instructions (Signed)

## 2021-10-02 NOTE — Progress Notes (Signed)
?Cardiology Office Note:   ? ?Date:  10/02/2021  ? ?ID:  Timothy [name]., DOB 1960-09-25, MRN [id number] ? ?PCP:  Ellene Route ?  ?[place]  ?Cardiologist:  Kate Sable, MD  ?Advanced Practice Provider:  No care team member to display ?Electrophysiologist:  None  ?    ? ?Referring MD: Ellene Route  ? ?Chief Complaint  ?Patient presents with  ? Other  ?  3 month follow up -- Meds reviewed verbally with patient.   ? ? ?History of Present Illness:   ? ?Timothy [name]. is a 61 y.o. male with a hx of CAD  (coronary calcifications on chest CT ), hypertension, diabetes, hyperlipidemia, COPD, L. DVT, current smoker x40+ years presenting for follow-up.  Last seen in the clinic due to palpitations. ? ?Toprol-XL was started and eventually titrated to 50 mg daily.  He states symptoms of palpitations have improved.  He gets palpitations occasionally when he overexerts himself.  Also no current shortness of breath.  His blood pressure is adequately controlled.  Overall feels well, he still smokes. ? ? ?Prior notes ?Echo 05/2021 EF 55 to 60% ?Lexiscan Myoview 10/2020 no evidence for ischemia, low risk study ?Echocardiogram obtained 05/2020 showed normal systolic and diastolic function, EF 60 to 65%.  PFTs 05/2020 showed moderate to severe COPD.  Coronary artery calcifications noted on prior CT chest for lung cancer screening. ?placed on clonidine by his psychiatrist because it also helps him sleep. ? ?Past Medical History:  ?Diagnosis Date  ? Diabetes mellitus without complication ([place])   ? Hypercholesteremia   ? Hypertension   ? ? ?Past Surgical History:  ?Procedure Laterality Date  ? AMPUTATION Left 03/22/2020  ? Procedure: AMPUTATION RAY-1st Ray Left Foot;  Surgeon: [name], [name];  Location: [facility] OR[facility];  Service: Podiatry;  Laterality: Left;  ? AMPUTATION TOE Left 03/16/2020  ? Procedure: AMPUTATION GREAT TOE;  Surgeon: [name], [name];  Location: [facility];  Service:  Podiatry;  Laterality: Left;  ? COLONOSCOPY WITH PROPOFOL N/A 12/22/2019  ? Procedure: COLONOSCOPY WITH PROPOFOL;  Surgeon: [name], MD;  Location: [facility];  Service: Endoscopy;  Laterality: N/A;  ? INCISION AND DRAINAGE ABSCESS Left 08/11/2021  ? Procedure: INCISION AND DRAINAGE ABSCESS;  Surgeon: Beverly Gust, MD;  Location: [facility];  Service: ENT;  Laterality: Left;  ? LOWER EXTREMITY ANGIOGRAPHY Left 03/18/2020  ? Procedure: Lower Extremity Angiography;  Surgeon: [name], MD;  Location: [facility];  Service: Cardiovascular;  Laterality: Left;  ? TONSILLECTOMY    ? VIDEO ASSISTED THORACOSCOPY (VATS)/DECORTICATION Left 08/15/2020  ? Procedure: VIDEO ASSISTED THORACOSCOPY (VATS)/DECORTICATION;  Surgeon: [name], MD;  Location: [facility];  Service: Thoracic;  Laterality: Left;  ? WISDOM TOOTH EXTRACTION    ? ? ?Current Medications: ?Current Meds  ?Medication Sig  ? acetaminophen (TYLENOL) 650 MG CR tablet Take 1,300 mg by mouth every 8 (eight) hours as needed for pain.  ? albuterol (VENTOLIN HFA) 108 (90 Base) MCG/ACT inhaler INHALE 1 PUFF INTO THE LUNGS ONCE EVERY 6 HOURS AS NEEDED FOR SHORTNESS OF BREATH OR WHEEZING.  ? amLODipine (NORVASC) 5 MG tablet Take 1 tablet (5 mg total) by mouth once daily.  ? apixaban (ELIQUIS) 5 MG TABS tablet TAKE ONE TABLET (5MG TOTAL) BY MOUTH TWICE DAILY.  ? ARIPiprazole (ABILIFY) 10 MG tablet TAKE ONE TABLET (10MG TOTAL) BY MOUTH ONCE DAILY AT BEDTIME.  ? blood glucose meter kit and supplies KIT Dispense based on  patient and insurance preference. Use up to four times daily as directed. (FOR ICD-9 250.00, 250.01).  ? blood glucose meter kit and supplies KIT Dispense based on patient and insurance preference. Use up to four times daily as directed.  ? cloNIDine (CATAPRES) 0.1 MG tablet Take one tablet ( 0.67m total) by mouth every day at bedtime.  ? Fluticasone-Umeclidin-Vilant (TRELEGY ELLIPTA) 100-62.5-25 MCG/ACT AEPB Inhale 1 puff into the  lungs daily.  ? gabapentin (NEURONTIN) 400 MG capsule TAKE ONE CAPSULE (400MG TOTAL) BY MOUTH THREE TIMES DAILY.  ? gentamicin ointment (GARAMYCIN) 0.1 % Apply topically in the morning and at bedtime. To left side of face.  ? glipiZIDE (GLUCOTROL) 10 MG tablet TAKE ONE TABLET (10MG TOTAL) BY MOUTH TWICE DAILY.  ? insulin glargine (LANTUS) 100 UNIT/ML Solostar Pen Inject 20 Units into the skin at bedtime.  ? Insulin Pen Needle (PEN NEEDLES) 32G X 6 MM MISC 1 each by Does not apply route daily.  ? magnesium oxide (MAG-OX) 400 MG tablet Take 800 mg by mouth 2 (two) times daily.  ? metFORMIN (GLUCOPHAGE) 1000 MG tablet TAKE ONE TABLET (1,000MG TOTAL) BY MOUTH TWICE DAILY.  ? metoprolol succinate (TOPROL-XL) 50 MG 24 hr tablet Take 1 tablet by mouth daily.  ? pantoprazole (PROTONIX) 40 MG tablet TAKE ONE TABLET (40MG TOTAL) BY MOUTH ONCE DAILY.  ? rosuvastatin (CRESTOR) 5 MG tablet TAKE ONE TABLET (5MG TOTAL) BY MOUTH ONCE DAILY.  ? venlafaxine XR (EFFEXOR XR) 37.5 MG 24 hr capsule TAKE 2 CAPSULES (75MG TOTAL) BY MOUTH ONCE DAILY.  ?  ? ?Allergies:   Bee venom, Cheese, Coffea arabica, and Coffee flavor  ? ?Social History  ? ?Socioeconomic History  ? Marital status: Single  ?  Spouse name: Not on file  ? Number of children: 0  ? Years of education: Not on file  ? Highest education level: Not on file  ?Occupational History  ? Occupation: Unemployed  ?Tobacco Use  ? Smoking status: Every Day  ?  Packs/day: 2.00  ?  Years: 41.00  ?  Pack years: 82.00  ?  Types: Cigarettes  ? Smokeless tobacco: Never  ? Tobacco comments:  ?  3-10cigs daily 05/24/2021  ?Vaping Use  ? Vaping Use: Never used  ?Substance and Sexual Activity  ? Alcohol use: Yes  ?  Alcohol/week: 6.0 standard drinks  ?  Types: 6 Cans of beer per week  ?  Comment: occasionally   ? Drug use: Not Currently  ? Sexual activity: Not Currently  ?Other Topics Concern  ? Not on file  ?Social History Narrative  ? Not on file  ? ?Social Determinants of Health  ? ?Financial  Resource Strain: Not on file  ?Food Insecurity: Not on file  ?Transportation Needs: Not on file  ?Physical Activity: Not on file  ?Stress: Not on file  ?Social Connections: Not on file  ?  ? ?Family History: ?The patient's Family history is unknown by patient. ? ?ROS:   ?Please see the history of present illness.    ? All other systems reviewed and are negative. ? ?EKGs/Labs/Other Studies Reviewed:   ? ?The following studies were reviewed today: ? ? ?EKG:  EKG not ordered today.  ? ?Recent Labs: ?12/18/2020: ALT 25 ?08/10/2021: Magnesium 2.0 ?08/11/2021: BUN 17; Creatinine, Ser 0.88; Hemoglobin 12.9; Platelets 305; Potassium 3.8; Sodium 133  ?Recent Lipid Panel ?   ?Component Value Date/Time  ? CHOL 167 06/22/2020 1058  ? TRIG 335 (H) 06/22/2020 1058  ? HDL 44  06/22/2020 1058  ? CHOLHDL 3.8 06/22/2020 1058  ? [place] 70 06/22/2020 1058  ? ? ? ?Risk Assessment/Calculations:   ? ? ? ?Physical Exam:   ? ?VS:  BP 120/66 (BP Location: Left Arm, Patient Position: Sitting, Cuff Size: Normal)   Pulse 74   Ht _0  (1.778 m)   Wt 206 lb (93.4 kg)   SpO2 96%   BMI 29.56 kg/m?    ? ?Wt Readings from Last 3 Encounters:  ?10/02/21 206 lb (93.4 kg)  ?09/07/21 200 lb 9.6 oz (91 kg)  ?08/11/21 205 lb 4 oz (93.1 kg)  ?  ? ?GEN:  Well nourished, well developed in no acute distress ?HEENT: Normal ?NECK: No JVD; No carotid bruits ?CARDIAC: RRR, no murmurs, rubs, gallops ?RESPIRATORY: Decreased breath sounds at bases, no wheezing ?ABDOMEN: Soft, non-tender, non-distended ?MUSCULOSKELETAL:  No edema; No deformity  ?SKIN: Warm and dry ?NEUROLOGIC:  Alert and oriented x 3 ?PSYCHIATRIC:  Normal affect  ? ?ASSESSMENT:   ? ?1. Paroxysmal SVT (supraventricular tachycardia) (HCC)   ?2. Coronary artery disease involving native coronary artery of native heart without angina pectoris   ?3. Primary hypertension   ?4. Smoking   ? ?PLAN:   ? ?In order of problems listed above: ? ?Paroxysmal SVT, palpitations.  Symptoms adequately controlled on  Toprol-XL.  Continue Toprol-XL 50 mg daily. ?CAD/coronary calcifications.  Myoview 10/2020 no evidence for ischemia.  LDL at goal.  LDL at goal, on Eliquis for DVT.  Continue Crestor, Eliquis. ?Hypertension, BP cont

## 2021-10-04 ENCOUNTER — Telehealth: Payer: Self-pay | Admitting: Acute Care

## 2021-10-04 DIAGNOSIS — R918 Other nonspecific abnormal finding of lung field: Secondary | ICD-10-CM

## 2021-10-04 NOTE — Telephone Encounter (Signed)
Results w/plan faxed to PCP 

## 2021-10-04 NOTE — Telephone Encounter (Signed)
I have called the patient with the results of his low dose CT Chest. I explained that his scan was read as a Lung RADS 4 A : suspicious findings, either short term follow up in 3 months or alternatively  PET Scan evaluation may be considered when there is a solid component of  8 mm or larger. He has a new nodule in his LLL measuring 7.2 mm , New linear opacity with associated solid nodular component measuring 5.2 mm, Additional new part solid nodule measuring 8.5 mm in overall diameter with 2.8 mm , and an Additional new small solid nodules  which measure less than 6 mm. ?Recommendation by radiology is for a 3 month follow up low dose CT Chest. He is in agreement with this plan. I have placed the order. Langley Gauss, please fax results to PCP .  ?I told him about the notations of CAD and Atherosclerotic disease of thoracic ?aorta.He is followed by cardiology and takes a statin as maintenance.  ? ?3 month Follow up scan due after 12/18/2021.  ?

## 2021-11-28 ENCOUNTER — Telehealth: Payer: Self-pay | Admitting: Pharmacy Technician

## 2021-11-28 ENCOUNTER — Other Ambulatory Visit (HOSPITAL_COMMUNITY): Payer: Self-pay

## 2021-11-28 NOTE — Telephone Encounter (Signed)
Received notification from [facility] [employer]ICA[employer] regarding a prior authorization for  Trelegy . Authorization has been APPROVED from 11/28/21 to 11/28/22.   Authorization # [id number]  Called pharmacy and advised.

## 2021-11-28 NOTE — Telephone Encounter (Signed)
Submitted a Prior Authorization request to [facility] for  Trelegy 100mg   via [employer] [employer]. Will update once we receive a response.   Confirmation [id number]

## 2021-12-07 ENCOUNTER — Encounter: Payer: Self-pay | Admitting: Acute Care

## 2021-12-29 ENCOUNTER — Ambulatory Visit
Admission: RE | Admit: 2021-12-29 | Discharge: 2021-12-29 | Disposition: A | Discharge status: Home / Self Care | Payer: Medicaid Other | Source: Ambulatory Visit | Attending: Acute Care | Admitting: Acute Care

## 2021-12-29 ENCOUNTER — Other Ambulatory Visit: Payer: Self-pay | Admitting: Acute Care

## 2021-12-29 DIAGNOSIS — Z87891 Personal history of nicotine dependence: Secondary | ICD-10-CM | POA: Insufficient documentation

## 2021-12-29 DIAGNOSIS — R911 Solitary pulmonary nodule: Secondary | ICD-10-CM | POA: Insufficient documentation

## 2021-12-29 DIAGNOSIS — R918 Other nonspecific abnormal finding of lung field: Secondary | ICD-10-CM | POA: Diagnosis present

## 2021-12-29 DIAGNOSIS — I700 Atherosclerosis of aorta: Secondary | ICD-10-CM | POA: Diagnosis not present

## 2021-12-29 DIAGNOSIS — J439 Emphysema, unspecified: Secondary | ICD-10-CM | POA: Diagnosis not present

## 2021-12-29 DIAGNOSIS — Z122 Encounter for screening for malignant neoplasm of respiratory organs: Secondary | ICD-10-CM | POA: Diagnosis not present

## 2022-01-02 ENCOUNTER — Other Ambulatory Visit: Payer: Self-pay | Admitting: Acute Care

## 2022-01-02 DIAGNOSIS — Z122 Encounter for screening for malignant neoplasm of respiratory organs: Secondary | ICD-10-CM

## 2022-01-02 DIAGNOSIS — F17210 Nicotine dependence, cigarettes, uncomplicated: Secondary | ICD-10-CM

## 2022-01-02 DIAGNOSIS — Z87891 Personal history of nicotine dependence: Secondary | ICD-10-CM

## 2022-01-18 ENCOUNTER — Ambulatory Visit: Payer: Medicaid Other | Admitting: Pulmonary Disease

## 2022-01-25 ENCOUNTER — Emergency Department: Payer: Medicaid Other

## 2022-01-25 ENCOUNTER — Emergency Department
Admission: EM | Admit: 2022-01-25 | Discharge: 2022-01-26 | Disposition: A | Discharge status: Home / Self Care | Payer: Medicaid Other | Attending: Emergency Medicine | Admitting: Emergency Medicine

## 2022-01-25 ENCOUNTER — Other Ambulatory Visit: Payer: Self-pay

## 2022-01-25 ENCOUNTER — Encounter: Payer: Self-pay | Admitting: Emergency Medicine

## 2022-01-25 DIAGNOSIS — Y92000 Kitchen of unspecified non-institutional (private) residence as  the place of occurrence of the external cause: Secondary | ICD-10-CM | POA: Insufficient documentation

## 2022-01-25 DIAGNOSIS — J449 Chronic obstructive pulmonary disease, unspecified: Secondary | ICD-10-CM | POA: Insufficient documentation

## 2022-01-25 DIAGNOSIS — S2242XA Multiple fractures of ribs, left side, initial encounter for closed fracture: Secondary | ICD-10-CM | POA: Diagnosis not present

## 2022-01-25 DIAGNOSIS — R0602 Shortness of breath: Secondary | ICD-10-CM | POA: Diagnosis not present

## 2022-01-25 DIAGNOSIS — I509 Heart failure, unspecified: Secondary | ICD-10-CM | POA: Insufficient documentation

## 2022-01-25 DIAGNOSIS — R42 Dizziness and giddiness: Secondary | ICD-10-CM | POA: Insufficient documentation

## 2022-01-25 DIAGNOSIS — S60222A Contusion of left hand, initial encounter: Secondary | ICD-10-CM | POA: Insufficient documentation

## 2022-01-25 DIAGNOSIS — Z720 Tobacco use: Secondary | ICD-10-CM | POA: Insufficient documentation

## 2022-01-25 DIAGNOSIS — Y93G3 Activity, cooking and baking: Secondary | ICD-10-CM | POA: Diagnosis not present

## 2022-01-25 DIAGNOSIS — W1839XA Other fall on same level, initial encounter: Secondary | ICD-10-CM | POA: Diagnosis not present

## 2022-01-25 DIAGNOSIS — S299XXA Unspecified injury of thorax, initial encounter: Secondary | ICD-10-CM | POA: Diagnosis present

## 2022-01-25 DIAGNOSIS — E119 Type 2 diabetes mellitus without complications: Secondary | ICD-10-CM | POA: Insufficient documentation

## 2022-01-25 DIAGNOSIS — W19XXXA Unspecified fall, initial encounter: Secondary | ICD-10-CM

## 2022-01-25 LAB — CBC WITH DIFFERENTIAL/PLATELET
Abs Immature Granulocytes: 0.02 10*3/uL (ref 0.00–0.07)
Basophils Absolute: 0.1 10*3/uL (ref 0.0–0.1)
Basophils Relative: 1 %
Eosinophils Absolute: 0.1 10*3/uL (ref 0.0–0.5)
Eosinophils Relative: 2 %
HCT: 45.9 % (ref 39.0–52.0)
Hemoglobin: 15.9 g/dL (ref 13.0–17.0)
Immature Granulocytes: 0 %
Lymphocytes Relative: 35 %
Lymphs Abs: 2.1 10*3/uL (ref 0.7–4.0)
MCH: 32.4 pg (ref 26.0–34.0)
MCHC: 34.6 g/dL (ref 30.0–36.0)
MCV: 93.7 fL (ref 80.0–100.0)
Monocytes Absolute: 0.6 10*3/uL (ref 0.1–1.0)
Monocytes Relative: 9 %
Neutro Abs: 3 10*3/uL (ref 1.7–7.7)
Neutrophils Relative %: 53 %
Platelets: 269 10*3/uL (ref 150–400)
RBC: 4.9 MIL/uL (ref 4.22–5.81)
RDW: 13.2 % (ref 11.5–15.5)
WBC: 5.9 10*3/uL (ref 4.0–10.5)
nRBC: 0 % (ref 0.0–0.2)

## 2022-01-25 LAB — COMPREHENSIVE METABOLIC PANEL
ALT: 14 U/L (ref 0–44)
AST: 16 U/L (ref 15–41)
Albumin: 4.6 g/dL (ref 3.5–5.0)
Alkaline Phosphatase: 77 U/L (ref 38–126)
Anion gap: 10 (ref 5–15)
BUN: 13 mg/dL (ref 6–20)
CO2: 26 mmol/L (ref 22–32)
Calcium: 9.5 mg/dL (ref 8.9–10.3)
Chloride: 100 mmol/L (ref 98–111)
Creatinine, Ser: 1.01 mg/dL (ref 0.61–1.24)
GFR, Estimated: >60 mL/min (ref >60)
Glucose, Bld: 300 mg/dL — ABNORMAL HIGH (ref 70–99)
Potassium: 4.2 mmol/L (ref 3.5–5.1)
Sodium: 136 mmol/L (ref 135–145)
Total Bilirubin: 0.8 mg/dL (ref 0.3–1.2)
Total Protein: 8 g/dL (ref 6.5–8.1)

## 2022-01-25 LAB — URINALYSIS, ROUTINE W REFLEX MICROSCOPIC
Bacteria, UA: NONE SEEN
Bilirubin Urine: NEGATIVE
Glucose, UA: >=500 mg/dL — AB
Hgb urine dipstick: NEGATIVE
Ketones, ur: 5 mg/dL — AB
Leukocytes,Ua: NEGATIVE
Nitrite: NEGATIVE
Protein, ur: 100 mg/dL — AB
Specific Gravity, Urine: 1.031 — ABNORMAL HIGH (ref 1.005–1.030)
Squamous Epithelial / HPF: NONE SEEN (ref 0–5)
pH: 5 (ref 5.0–8.0)

## 2022-01-25 LAB — TROPONIN I (HIGH SENSITIVITY): Troponin I (High Sensitivity): 4 ng/L (ref <18)

## 2022-01-25 NOTE — ED Triage Notes (Signed)
Fall 2 days ago, standing at stove cooking, lost consciousness and woke up on the floor, pain in L ribs at that time. Larey Seat again on R side yesterday. Today, twisted while swatting at a fly and feels that he may have pulled a muscle d/t pain in L chest.   Has not sought eval prior today.   Primary concern is L chest wall pain. H/o of chest tube 2 years ago.  Intermittent dizziness for weeks but has not sought care from PCP yet.   Postive SOB. No fever, chills.

## 2022-01-25 NOTE — ED Provider Triage Note (Signed)
Emergency Medicine Provider Triage Evaluation Note  Timothy [name]. , a 61 y.o. male  was evaluated in triage.  Pt complains of severe left rib pain, chest pain.  Had a syncopal episode 2 days ago unknown reason, patient fell and injured his left ribs.  Having severe left rib pain.  States has had dizziness for at least 1 month.  Occasional nausea.  No focal weakness, vision changes, vomiting.  Review of Systems  Positive: Syncopal episode, chest pain, left rib pain, nausea, dizziness Negative: Shortness of breath, fevers, abdominal pain, back pain, dysuria  Physical Exam  BP (!) 135/92 (BP Location: Right Arm)   Pulse 93   Temp 98.2 F (36.8 C) (Oral)   Resp 18   Wt 94 kg   SpO2 96%   BMI 29.73 kg/m  Gen:   Awake, no distress   Resp:  Normal effort  MSK:   Moves extremities without difficulty  Other:  Tender to palpation left ribs, no ecchymosis Medical Decision Making  Medically screening exam initiated at 7:18 PM.  Appropriate orders placed.  [name] was informed that the remainder of the evaluation will be completed by another provider, this initial triage assessment does not replace that evaluation, and the importance of remaining in the ED until their evaluation is complete.     Evon Slack, [place] 01/25/22 1921

## 2022-01-26 ENCOUNTER — Telehealth: Payer: Self-pay | Admitting: Emergency Medicine

## 2022-01-26 ENCOUNTER — Emergency Department: Payer: Medicaid Other

## 2022-01-26 ENCOUNTER — Other Ambulatory Visit (HOSPITAL_COMMUNITY): Payer: Self-pay

## 2022-01-26 MED ORDER — DOCUSATE SODIUM 100 MG PO CAPS
ORAL_CAPSULE | ORAL | 0 refills | Status: DC
  Filled 2022-01-26: qty 30, fill #0

## 2022-01-26 MED ORDER — OXYCODONE-ACETAMINOPHEN 5-325 MG PO TABS
2.0000 | ORAL_TABLET | Freq: Once | ORAL | Status: AC
  Administered 2022-01-26: 2 via ORAL
  Filled 2022-01-26: qty 2

## 2022-01-26 MED ORDER — OXYCODONE-ACETAMINOPHEN 5-325 MG PO TABS
2.0000 | ORAL_TABLET | Freq: Four times a day (QID) | ORAL | 0 refills | Status: DC | PRN
  Filled 2022-01-26: qty 16, 2d supply, fill #0

## 2022-01-26 NOTE — Discharge Instructions (Signed)
Your workup today showed that you have a fracture to one or more ribs.  Unfortunately this type of injury hurts but there is no way to fix it immediately; it must heal over time.  Be sure to take plenty of deep breaths so that you get rid of the "bad air" in your lungs.  If you are given a device called an incentive spirometer, please use it as recommended. ° °Unless you have been told by your doctor not to do so, we recommend you take ibuprofen 600 mg 3 times daily with meals for no more than 5 days.  You can also take Tylenol 1000 mg every 6 hours for pain. ° °Take Percocet as prescribed for severe pain. Do not drink alcohol, drive or participate in any other potentially dangerous activities while taking this medication as it may make you sleepy. Do not take this medication with any other sedating medications, either prescription or over-the-counter. If you were prescribed Percocet or Vicodin, do not take these with acetaminophen (Tylenol) as it is already contained within these medications. °  °This medication is an opiate (or narcotic) pain medication and can be habit forming.  Use it as little as possible to achieve adequate pain control.  Do not use or use it with extreme caution if you have a history of opiate abuse or dependence.  If you are on a pain contract with your primary care doctor or a pain specialist, be sure to let them know you were prescribed this medication today from the [facility].  This medication is intended for your use only - do not give any to anyone else and keep it in a secure place where nobody else, especially children, have access to it.  It will also cause or worsen constipation, so you may want to consider taking an over-the-counter stool softener while you are taking this medication. ° °Follow-up at the clinics or with the doctors described in this paperwork.  Return to the emergency department if he develop new or worsening symptoms that concern  you. °

## 2022-01-26 NOTE — ED Provider Notes (Signed)
[facility] Provider Note    Event Date/Time   First MD Initiated Contact with Patient 01/25/22 2355     (approximate)   History   No chief complaint on file.   HPI  Timothy [name]. is a 61 y.o. male whose medical history notably includes but is not limited to COPD, history of empyema requiring chest tube, major depressive disorder, antisocial personality disorder, diabetes, CHF, occasional alcohol use, ongoing tobacco use.  The patient presents for evaluation of pain in the left side of his chest after a fall 2 days ago.  He said that he lost consciousness while standing at the stove and cooking.  He woke up on the floor with pain in the left side of his ribs.  He was not concerned that much about it at the time, but came in today because he twisted to the side to swat a fly and then fell on his right side.  He did not lose consciousness.  He said it feels like he may have pulled a muscle or something is pinching him on the left side of the chest.  Occasionally he has felt dizzy from time to time but has not followed up with his primary care doctor yet.  He feels short of breath because of the pain associated with deep breaths but he has had no fever or chills.  He is not otherwise short of breath except for the left-sided chest wall pain.  He has no nausea, vomiting, nor abdominal pain.  Of note, he is also reporting pain and some swelling in the left hand after his fall 2 days ago.     Physical Exam   Triage Vital Signs: ED Triage Vitals  Enc Vitals Group     BP 01/25/22 1913 (!) 135/92     Pulse Rate 01/25/22 1913 93     Resp 01/25/22 1913 18     Temp 01/25/22 1913 98.2 F (36.8 C)     Temp Source 01/25/22 1913 Oral     SpO2 01/25/22 1913 96 %     Weight 01/25/22 1914 94 kg (207 lb 3.7 oz)     Height --      Head Circumference --      Peak Flow --      Pain Score 01/25/22 1914 10     Pain Loc --      Pain Edu? --      Excl. in GC? --      Most recent vital signs: Vitals:   01/25/22 1913 01/26/22 0011  BP: (!) 135/92 (!) 164/90  Pulse: 93 78  Resp: 18 16  Temp: 98.2 F (36.8 C) 98.4 F (36.9 C)  SpO2: 96% 95%     General: Awake, no distress while lying in bed at rest. CV:  Good peripheral perfusion.  Normal heart sounds. Resp:  Normal effort but with some pain with deep inspiration and with an occasional cough.  Lungs are clear to auscultation bilaterally with no wheezes, rales, nor rhonchi. Abd:  No distention.  No tenderness to palpation of the abdomen. Other:  Very small amount of ecchymosis to the left side of his chest.  He has some tenderness to palpation of the left chest wall consistent with the fractures seen on his imaging.   ED Results / Procedures / Treatments   Labs (all labs ordered are listed, but only abnormal results are displayed) Labs Reviewed  COMPREHENSIVE METABOLIC PANEL - Abnormal; Notable for the  following components:      Result Value   Glucose, Bld 300 (*)    All other components within normal limits  URINALYSIS, ROUTINE W REFLEX MICROSCOPIC - Abnormal; Notable for the following components:   Color, Urine YELLOW (*)    APPearance CLEAR (*)    Specific Gravity, Urine 1.031 (*)    Glucose, UA >=500 (*)    Ketones, ur 5 (*)    Protein, ur 100 (*)    All other components within normal limits  CBC WITH DIFFERENTIAL/PLATELET  TROPONIN I (HIGH SENSITIVITY)     EKG  ED ECG REPORT I, Loleta Rose, the attending physician, personally viewed and interpreted this ECG.  Date: 01/25/2022 EKG Time: 19: 17 Rate: 91 Rhythm: normal sinus rhythm QRS Axis: normal Intervals: normal ST/T Wave abnormalities: normal Narrative Interpretation: no evidence of acute ischemia    RADIOLOGY I viewed and interpreted the patient's CT head, CT cervical spine, and CT chest without contrast.  No evidence of acute intracranial or cervical spine abnormality.  On the CT chest, he has what appears to  be 2 acute fractures on his left lateral ribs.  This is consistent with the clinical exam.  Radiology report agrees with the findings as described, specifically identifying the ribs as the left fourth and fifth ribs.  I also viewed and interpreted the patient's hand x-rays (left hand).  I see no evidence of fracture nor dislocation.  The radiologist comments on soft tissue swelling but no fracture or dislocation.    PROCEDURES:  Critical Care performed: No  Procedures   MEDICATIONS ORDERED IN ED: Medications  oxyCODONE-acetaminophen (PERCOCET/ROXICET) 5-325 MG per tablet 2 tablet (2 tablets Oral Given 01/26/22 0122)     IMPRESSION / MDM / ASSESSMENT AND PLAN / ED COURSE  I reviewed the triage vital signs and the nursing notes.                              Differential diagnosis includes, but is not limited to, fracture, dislocation, acute intracranial hemorrhage, cervical spine injury, electrolyte or metabolic abnormality.  Patient's presentation is most consistent with acute presentation with potential threat to life or bodily function.  Labs/studies ordered: EKG, CT head, CT cervical spine, CT chest, x-rays of the hand, CBC with differential, urinalysis, CMP, high-sensitivity troponin.  Fortunately the patient's workup has been reassuring.  He is hypertensive but otherwise has stable vital signs.  No evidence of ischemia on EKG.  As documented above, his imaging is all reassuring with the only acute finding being two left sided rib fractures.  He has a contusion to his left hand but no fracture or dislocation.  His labs are all essentially normal.  I have no concern for ischemic chest pain; he has 2 rib fractures, negative troponin after 2 days of pain, and no ischemia.  No indication to repeat a high-sensitivity troponin.  No acute abnormalities on urinalysis.  I ordered Percocet 2 tablets and provided an incentive spirometer instructions.  I went over my usual rib fracture  recommendations and encouraged close outpatient follow-up.  I explained the pain control will be key and wrote him a prescription for Percocet after checking the [place] controlled substance database and verify that he has no concerning prescription habits or patterns.  He understands and agrees with the plan.  After an extended period of observation and evaluation in the emergency department, although I initially considered hospitalization given his consolation of  symptoms and chronic medical conditions, I believe that he is stable and appropriate for discharge and outpatient follow-up.  He agrees with the plan and understands the return precautions      FINAL CLINICAL IMPRESSION(S) / ED DIAGNOSES   Final diagnoses:  Fall, initial encounter  Contusion of left hand, initial encounter  Closed fracture of multiple ribs of left side, initial encounter     Rx / DC Orders   ED Discharge Orders          Ordered    Provide patient with incentive spirometry fact sheet.        01/26/22 0121    oxyCODONE-acetaminophen (PERCOCET) 5-325 MG tablet  Every 6 hours PRN        01/26/22 0206    docusate sodium (COLACE) 100 MG capsule        01/26/22 0206             Note:  This document was prepared using Dragon voice recognition software and may include unintentional dictation errors.   Loleta Rose, MD 01/26/22 [phone]

## 2022-01-26 NOTE — ED Notes (Signed)
Transported to xray 

## 2022-01-26 NOTE — ED Notes (Signed)
Incentive spirometer provided at discharge, instructed about use.

## 2022-01-29 ENCOUNTER — Other Ambulatory Visit (HOSPITAL_COMMUNITY): Payer: Self-pay

## 2022-01-30 NOTE — Telephone Encounter (Signed)
Called patient again about rx went to wrong pharmacy.  He left a message on my vm yesterday afternoon about this.  He does not answer and no voicemail.

## 2022-03-01 IMAGING — CT CT ABD-PEL WO/W CM
3 of 9 series · 15 of 46 positions shown, 17 images · IV contrast (omnipaque)
Comparison: CT angiography of the chest from August 10, 2020

CLINICAL DATA: LEFT lower quadrant abdominal pain in a 59-year-old
male

EXAM:
CT ABDOMEN AND PELVIS WITHOUT AND WITH CONTRAST
TECHNIQUE: Multidetector CT imaging of the abdomen and pelvis was performed
following the standard protocol before and following the bolus
administration of intravenous contrast.
CONTRAST:  100mL OMNIPAQUE IOHEXOL 300 MG/ML  SOLN

[Series 2: abd/ pelvis 5.0 i30f 2 · axial · 0.83mm/px · z∈[+752,+802]mm · 2 of 96 slices shown]
[im 10/96  soft-tissue]
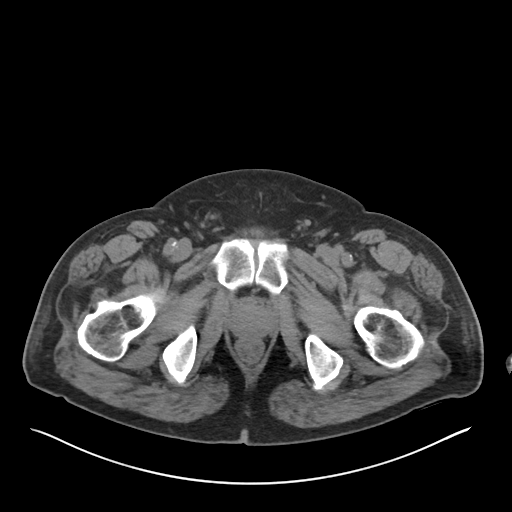
[im 20/96  soft-tissue]
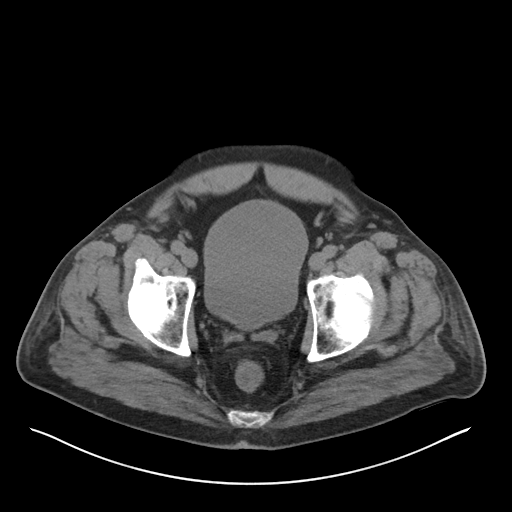

[Series 4: cor st w/o · coronal · non-contrast · 0.78mm/px · 3 of 117 slices shown]
[im 30/117  soft-tissue]
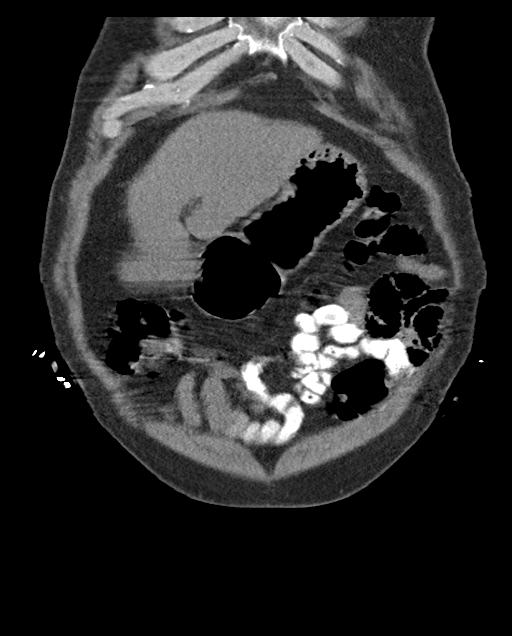
[im 59/117  soft-tissue]
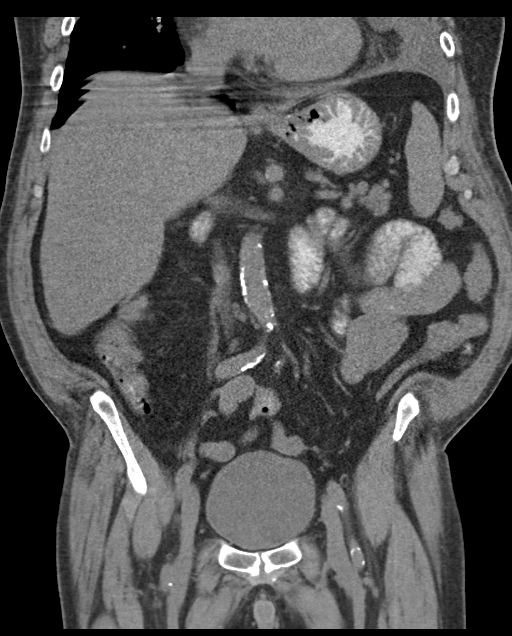
[im 88/117  soft-tissue]
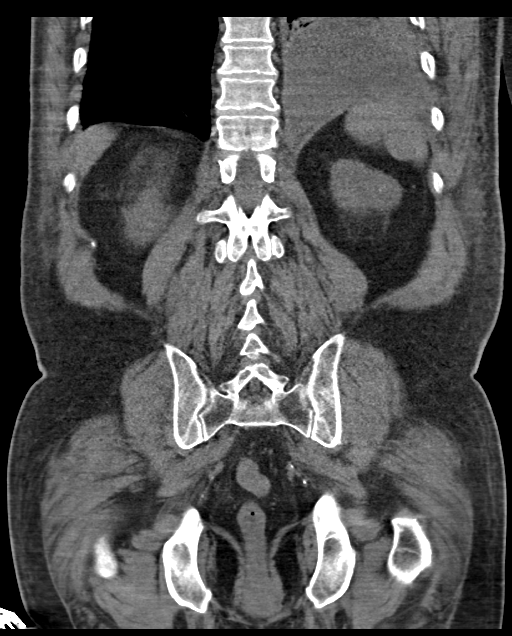

[Series 7: abd/ pelvis 5.0 i30f 2 w · axial · 0.90mm/px · z∈[+749,+1124]mm · 10 of 93 slices shown, 12 images]
[im 9/93  soft-tissue]
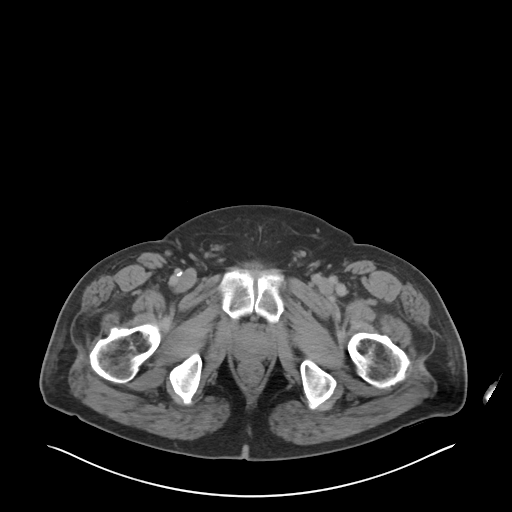
[im 9/93  bone]
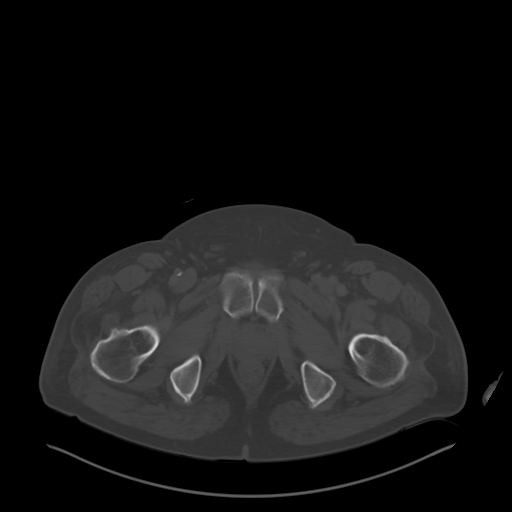
[im 17/93  soft-tissue]
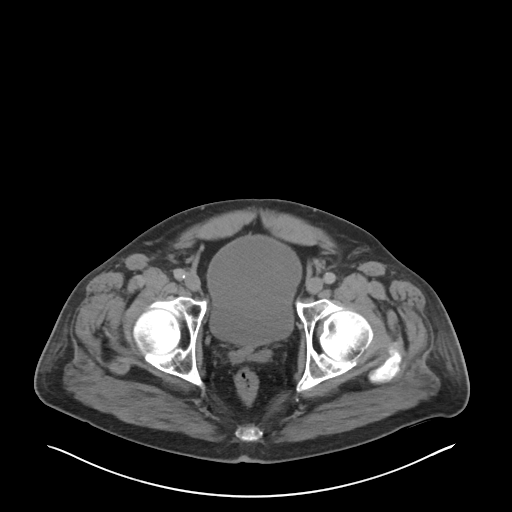
[im 26/93  soft-tissue]
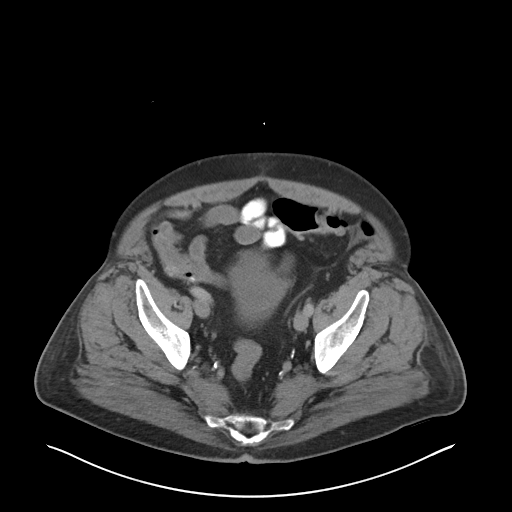
[im 34/93  soft-tissue]
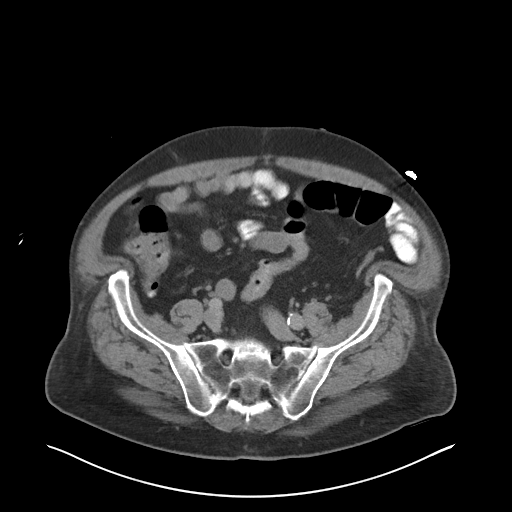
[im 42/93  soft-tissue]
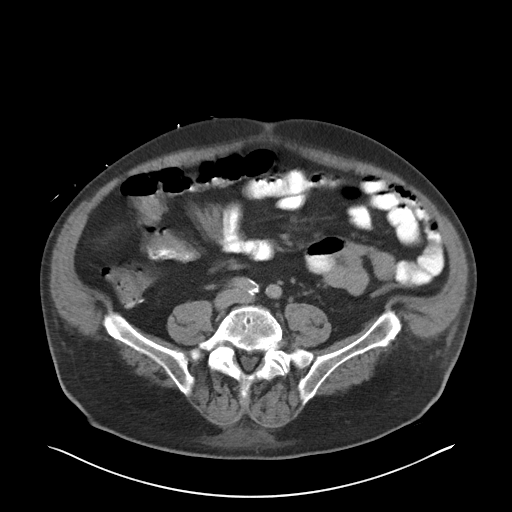
[im 51/93  soft-tissue]
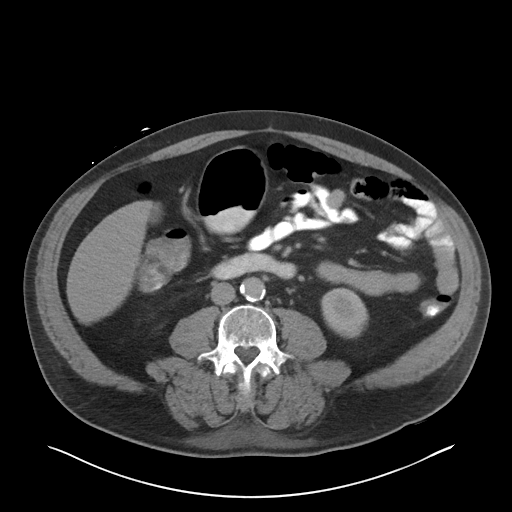
[im 59/93  soft-tissue]
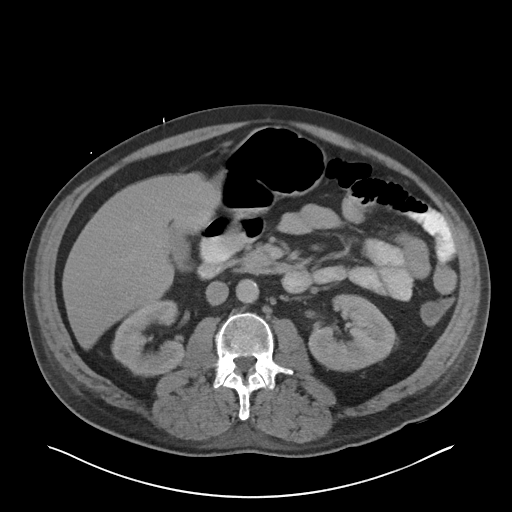
[im 67/93  soft-tissue]
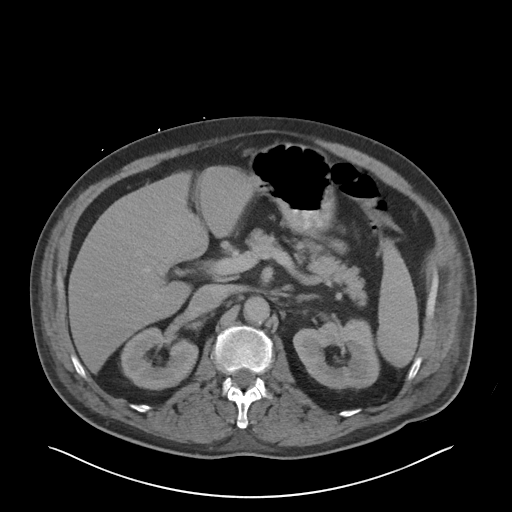
[im 76/93  soft-tissue]
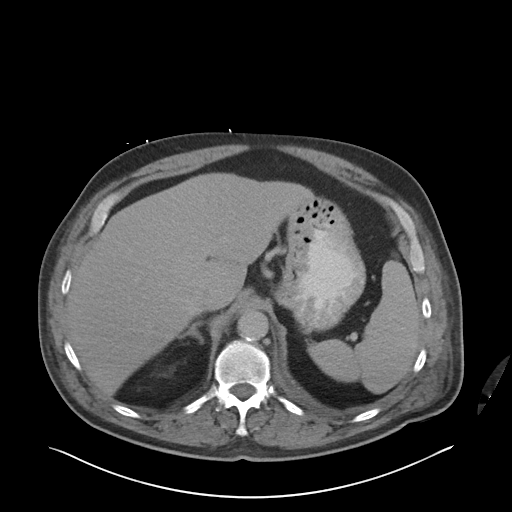
[im 76/93  bone]
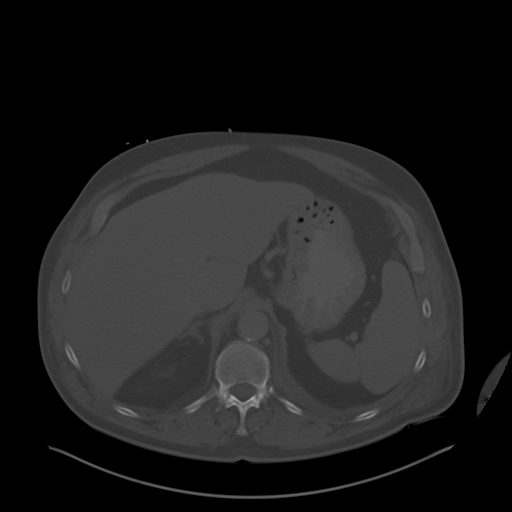
[im 84/93  soft-tissue]
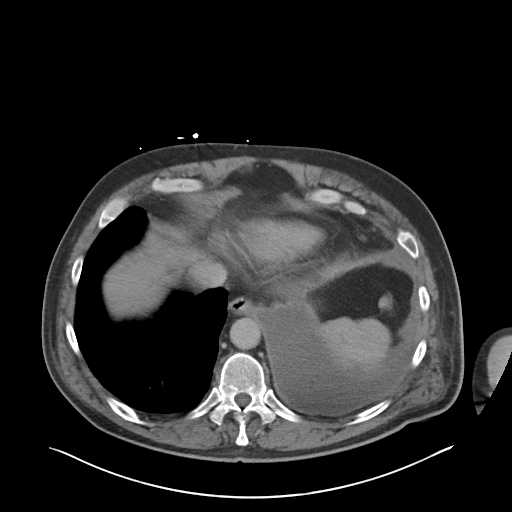

[15 of 46 positions shown; findings below may reference images not displayed]

FINDINGS: Lower chest: Signs of suspected empyema and LEFT lower lobe airspace
disease better demonstrated on preceding CT of the chest.

Hepatobiliary: No focal, suspicious hepatic lesion. The portal vein
is patent. No pericholecystic stranding. Mildly limited assessment
due to respiratory motion of all upper abdominal viscera. Sludge
and/or small stones in the dependent gallbladder.

Pancreas: Normal, without mass, inflammation or ductal dilatation.

Spleen: Normal size spleen without focal lesion.

Adrenals/Urinary Tract: Adrenal glands are normal.

Symmetric renal enhancement. No hydronephrosis. Mild renal cortical
scarring on the RIGHT. Mild perinephric stranding, slightly
asymmetric along the upper pole the RIGHT kidney as compared to the
LEFT. No visible lesion.

Stomach/Bowel: No acute gastrointestinal process. Colonic
diverticulosis of the sigmoid colon.

Appendix not visualized though there are no secondary signs to
suggest acute appendicitis.

Vascular/Lymphatic: Calcified noncalcified atheromatous plaque in
the abdominal aorta without aortic aneurysm.

Celiac dilation up to 12 mm, was present on exam from 0439 and is
little changed. Peripherally calcified mild aneurysmal dilation of
the proximal portion of the splenic artery near the celiac
bifurcation measures 1.3 x 1.0 cm and showed calcification before,
more calcified on today's exam only minimally more dilated than on
the previous study. There is no gastrohepatic or hepatoduodenal
ligament lymphadenopathy. No retroperitoneal or mesenteric
lymphadenopathy.

No pelvic sidewall lymphadenopathy.

Reproductive: Prostate and urinary bladder are grossly normal on CT.
No perivesical stranding.

Other: No free air.  No ascites.

Musculoskeletal: Spinal degenerative changes. No acute or
destructive bone process.
IMPRESSION: 1. Signs of suspected empyema and LEFT lower lobe airspace disease
better demonstrated on prior CT of the chest.
2. Mild perinephric stranding slightly greater on the RIGHT than the
LEFT. Correlate with urinalysis. No classic findings otherwise of
pyelonephritis on the current study.
3. Chronic mild aneurysmal caliber of the celiac axis and calcified
splenic artery aneurysm little changed compared to May 26, 2012.
4. Colonic diverticulosis without evidence of acute gastrointestinal
process.
5. Aortic atherosclerosis.

Aortic Atherosclerosis (A2M14-R09.9).

## 2022-03-28 ENCOUNTER — Other Ambulatory Visit: Payer: Self-pay | Admitting: Podiatry

## 2022-04-03 ENCOUNTER — Other Ambulatory Visit: Payer: Self-pay

## 2022-04-03 ENCOUNTER — Encounter
Admission: RE | Admit: 2022-04-03 | Discharge: 2022-04-03 | Disposition: A | Discharge status: Home / Self Care | Payer: Medicaid Other | Source: Ambulatory Visit | Attending: Podiatry | Admitting: Podiatry

## 2022-04-03 DIAGNOSIS — Z01812 Encounter for preprocedural laboratory examination: Secondary | ICD-10-CM

## 2022-04-03 HISTORY — DX: Chronic obstructive pulmonary disease, unspecified: J44.9

## 2022-04-03 HISTORY — DX: Gastro-esophageal reflux disease without esophagitis: K21.9

## 2022-04-03 HISTORY — DX: Depression, unspecified: F32.A

## 2022-04-03 HISTORY — DX: Pneumonia, unspecified organism: J18.9

## 2022-04-03 HISTORY — DX: Anxiety disorder, unspecified: F41.9

## 2022-04-03 NOTE — Patient Instructions (Addendum)
Your procedure is scheduled on: Report to the Registration Desk on the 1st floor of the [facility]. To find out your arrival time, please call [phone] between 1PM - 3PM on: If your arrival time is 6:00 am, do not arrive prior to that time as the [facility] entrance doors do not open until 6:00 am.  REMEMBER: Instructions that are not followed completely may result in serious medical risk, up to and including death; or upon the discretion of your surgeon and anesthesiologist your surgery may need to be rescheduled.  Do not eat food after midnight the night before surgery.  No gum chewing, lozengers or hard candies.  You may however, drink CLEAR liquids up to 2 hours before you are scheduled to arrive for your surgery. Do not drink anything within 2 hours of your scheduled arrival time. Type 1 and Type 2 diabetics should only drink water.  TAKE ONLY THESE MEDICATIONS THE MORNING OF SURGERY WITH A SIP OF WATER:  - amLODipine (NORVASC)  - TRELEGY ELLIPTA)  - gabapentin (NEURONTIN) - metoprolol succinate  - venlafaxine XR   - Inject  NO LANTUS on the morning of surgery  - Patient reports that he stopped taking his Eliquis approximately 3 days ago.  - HOLD metFORMIN (GLUCOPHAGE) beginning 04/04/22.   One week prior to surgery: Stop Anti-inflammatories (NSAIDS) such as Advil, Aleve, Ibuprofen, Motrin, Naproxen, Naprosyn and Aspirin based products such as Excedrin, Goodys Powder, BC Powder.  Stop ANY OVER THE COUNTER supplements until after surgery.  You may however, continue to take Tylenol if needed for pain up until the day of surgery.  No Alcohol for 24 hours before or after surgery.  No Smoking including e-cigarettes for 24 hours prior to surgery.  No chewable tobacco products for at least 6 hours prior to surgery.  No nicotine patches on the day of surgery.  Do not use any "recreational" drugs for at least a week prior to your surgery.  Please be advised that the  combination of cocaine and anesthesia may have negative outcomes, up to and including death. If you test positive for cocaine, your surgery will be cancelled.  On the morning of surgery brush your teeth with toothpaste and water, you may rinse your mouth with mouthwash if you wish. Do not swallow any toothpaste or mouthwash.  Do not wear jewelry, make-up, hairpins, clips or nail polish.  Do not wear lotions, powders, or perfumes.   Do not shave body from the neck down 48 hours prior to surgery just in case you cut yourself which could leave a site for infection.  Also, freshly shaved skin may become irritated if using the CHG soap.  Contact lenses, hearing aids and dentures may not be worn into surgery.  Do not bring valuables to the hospital. [facility] is not responsible for any missing/lost belongings or valuables.   Notify your doctor if there is any change in your medical condition (cold, fever, infection).  Wear comfortable clothing (specific to your surgery type) to the hospital.  After surgery, you can help prevent lung complications by doing breathing exercises.  Take deep breaths and cough every 1-2 hours. Your doctor may order a device called an Incentive Spirometer to help you take deep breaths. When coughing or sneezing, hold a pillow firmly against your incision with both hands. This is called "splinting." Doing this helps protect your incision. It also decreases belly discomfort.  If you are being admitted to the hospital overnight, leave your suitcase  in the car. After surgery it may be brought to your room.  If you are being discharged the day of surgery, you will not be allowed to drive home. You will need a responsible adult (18 years or older) to drive you home and stay with you that night.   If you are taking public transportation, you will need to have a responsible adult (18 years or older) with you. Please confirm with your physician that it is acceptable to  use public transportation.   Please call the [facility] at [phone] if you have any questions about these instructions.  Surgery Visitation Policy:  Patients undergoing a surgery or procedure may have two family members or support persons with them as long as the person is not COVID-19 positive or experiencing its symptoms.   Inpatient Visitation:    Visiting hours are 7 a.m. to 8 p.m. Up to four visitors are allowed at one time in a patient room, including children. The visitors may rotate out with other people during the day. One designated support person (adult) may remain overnight.

## 2022-04-04 ENCOUNTER — Telehealth: Payer: Self-pay | Admitting: Pulmonary Disease

## 2022-04-04 NOTE — Telephone Encounter (Signed)
Amputation of the left second toe by Dr. Caryl Comes.

## 2022-04-04 NOTE — Telephone Encounter (Signed)
Noted  

## 2022-04-06 ENCOUNTER — Ambulatory Visit: Payer: Medicaid Other | Admitting: Anesthesiology

## 2022-04-06 ENCOUNTER — Other Ambulatory Visit: Payer: Self-pay

## 2022-04-06 ENCOUNTER — Encounter: Payer: Self-pay | Admitting: Podiatry

## 2022-04-06 ENCOUNTER — Ambulatory Visit
Admission: RE | Admit: 2022-04-06 | Discharge: 2022-04-06 | Disposition: A | Discharge status: Home / Self Care | Payer: Medicaid Other | Source: Ambulatory Visit | Attending: Podiatry | Admitting: Podiatry

## 2022-04-06 ENCOUNTER — Encounter
Admission: RE | Disposition: A | Discharge status: Home / Self Care | Payer: Self-pay | Source: Ambulatory Visit | Attending: Podiatry

## 2022-04-06 DIAGNOSIS — E1140 Type 2 diabetes mellitus with diabetic neuropathy, unspecified: Secondary | ICD-10-CM | POA: Diagnosis not present

## 2022-04-06 DIAGNOSIS — G4730 Sleep apnea, unspecified: Secondary | ICD-10-CM | POA: Diagnosis not present

## 2022-04-06 DIAGNOSIS — F17200 Nicotine dependence, unspecified, uncomplicated: Secondary | ICD-10-CM | POA: Insufficient documentation

## 2022-04-06 DIAGNOSIS — E1165 Type 2 diabetes mellitus with hyperglycemia: Secondary | ICD-10-CM | POA: Insufficient documentation

## 2022-04-06 DIAGNOSIS — J449 Chronic obstructive pulmonary disease, unspecified: Secondary | ICD-10-CM | POA: Diagnosis not present

## 2022-04-06 DIAGNOSIS — M2042 Other hammer toe(s) (acquired), left foot: Secondary | ICD-10-CM | POA: Insufficient documentation

## 2022-04-06 DIAGNOSIS — M86172 Other acute osteomyelitis, left ankle and foot: Secondary | ICD-10-CM | POA: Insufficient documentation

## 2022-04-06 DIAGNOSIS — Z9981 Dependence on supplemental oxygen: Secondary | ICD-10-CM | POA: Insufficient documentation

## 2022-04-06 DIAGNOSIS — E1169 Type 2 diabetes mellitus with other specified complication: Secondary | ICD-10-CM | POA: Insufficient documentation

## 2022-04-06 DIAGNOSIS — Z01812 Encounter for preprocedural laboratory examination: Secondary | ICD-10-CM

## 2022-04-06 DIAGNOSIS — Z794 Long term (current) use of insulin: Secondary | ICD-10-CM | POA: Insufficient documentation

## 2022-04-06 DIAGNOSIS — Z7984 Long term (current) use of oral hypoglycemic drugs: Secondary | ICD-10-CM | POA: Diagnosis not present

## 2022-04-06 DIAGNOSIS — E1151 Type 2 diabetes mellitus with diabetic peripheral angiopathy without gangrene: Secondary | ICD-10-CM | POA: Diagnosis not present

## 2022-04-06 HISTORY — PX: AMPUTATION TOE: SHX6595

## 2022-04-06 LAB — GLUCOSE, CAPILLARY
Glucose-Capillary: 405 mg/dL — ABNORMAL HIGH (ref 70–99)
Glucose-Capillary: 374 mg/dL — ABNORMAL HIGH (ref 70–99)
Glucose-Capillary: 310 mg/dL — ABNORMAL HIGH (ref 70–99)

## 2022-04-06 SURGERY — AMPUTATION, TOE
Anesthesia: General | Site: Toe | Laterality: Left

## 2022-04-06 MED ORDER — TRAZODONE HCL 50 MG PO TABS: 25.0000 mg | ORAL_TABLET | Freq: Every evening | ORAL | Status: DC | PRN

## 2022-04-06 MED ORDER — SODIUM CHLORIDE 0.9 % IV SOLN: INTRAVENOUS | Status: DC

## 2022-04-06 MED ORDER — CHLORHEXIDINE GLUCONATE 0.12 % MT SOLN
OROMUCOSAL | Status: AC
  Administered 2022-04-06: 15 mL via OROMUCOSAL
  Filled 2022-04-06: qty 15

## 2022-04-06 MED ORDER — 0.9 % SODIUM CHLORIDE (POUR BTL) OPTIME
TOPICAL | Status: DC | PRN
  Administered 2022-04-06: 500 mL

## 2022-04-06 MED ORDER — LIDOCAINE HCL (CARDIAC) PF 100 MG/5ML IV SOSY
PREFILLED_SYRINGE | INTRAVENOUS | Status: DC | PRN
  Administered 2022-04-06: 50 mg via INTRAVENOUS

## 2022-04-06 MED ORDER — CHLORHEXIDINE GLUCONATE 0.12 % MT SOLN: 15.0000 mL | Freq: Once | OROMUCOSAL | Status: AC

## 2022-04-06 MED ORDER — PROPOFOL 10 MG/ML IV BOLUS
INTRAVENOUS | Status: DC | PRN
  Administered 2022-04-06: 150 mg via INTRAVENOUS

## 2022-04-06 MED ORDER — BUPIVACAINE HCL (PF) 0.5 % IJ SOLN
INTRAMUSCULAR | Status: AC
  Filled 2022-04-06: qty 30

## 2022-04-06 MED ORDER — HYDROMORPHONE HCL 1 MG/ML IJ SOLN: 0.2500 mg | INTRAMUSCULAR | Status: DC | PRN

## 2022-04-06 MED ORDER — HYDROCODONE-ACETAMINOPHEN 5-325 MG PO TABS: 1.0000 | ORAL_TABLET | ORAL | 0 refills | Status: DC | PRN

## 2022-04-06 MED ORDER — INSULIN ASPART 100 UNIT/ML IJ SOLN
12.0000 [IU] | Freq: Once | INTRAMUSCULAR | Status: AC
  Administered 2022-04-06: 12 [IU] via SUBCUTANEOUS

## 2022-04-06 MED ORDER — MIDAZOLAM HCL 2 MG/2ML IJ SOLN
INTRAMUSCULAR | Status: AC
  Filled 2022-04-06: qty 2

## 2022-04-06 MED ORDER — LACTATED RINGERS IV SOLN: INTRAVENOUS | Status: DC | PRN

## 2022-04-06 MED ORDER — FENTANYL CITRATE (PF) 100 MCG/2ML IJ SOLN
INTRAMUSCULAR | Status: AC
  Filled 2022-04-06: qty 2

## 2022-04-06 MED ORDER — MORPHINE SULFATE (PF) 4 MG/ML IV SOLN: 4.0000 mg | INTRAVENOUS | Status: DC | PRN

## 2022-04-06 MED ORDER — ONDANSETRON HCL 4 MG/2ML IJ SOLN
INTRAMUSCULAR | Status: DC | PRN
  Administered 2022-04-06: 4 mg via INTRAVENOUS

## 2022-04-06 MED ORDER — CEFAZOLIN SODIUM-DEXTROSE 2-4 GM/100ML-% IV SOLN
INTRAVENOUS | Status: AC
  Filled 2022-04-06: qty 100

## 2022-04-06 MED ORDER — CEFAZOLIN SODIUM-DEXTROSE 2-4 GM/100ML-% IV SOLN
2.0000 g | INTRAVENOUS | Status: AC
  Administered 2022-04-06: 2 g via INTRAVENOUS

## 2022-04-06 MED ORDER — EPHEDRINE SULFATE (PRESSORS) 50 MG/ML IJ SOLN
INTRAMUSCULAR | Status: DC | PRN
  Administered 2022-04-06 (×2): 10 mg via INTRAVENOUS

## 2022-04-06 MED ORDER — MIDAZOLAM HCL 2 MG/2ML IJ SOLN
INTRAMUSCULAR | Status: DC | PRN
  Administered 2022-04-06: 2 mg via INTRAVENOUS

## 2022-04-06 MED ORDER — OXYCODONE HCL 5 MG/5ML PO SOLN: 5.0000 mg | Freq: Once | ORAL | Status: DC | PRN

## 2022-04-06 MED ORDER — INSULIN ASPART 100 UNIT/ML IJ SOLN
INTRAMUSCULAR | Status: AC
  Filled 2022-04-06: qty 1

## 2022-04-06 MED ORDER — OXYCODONE HCL 5 MG PO TABS: 5.0000 mg | ORAL_TABLET | Freq: Once | ORAL | Status: DC | PRN

## 2022-04-06 MED ORDER — ORAL CARE MOUTH RINSE: 15.0000 mL | Freq: Once | OROMUCOSAL | Status: AC

## 2022-04-06 MED ORDER — FENTANYL CITRATE (PF) 100 MCG/2ML IJ SOLN
INTRAMUSCULAR | Status: DC | PRN
  Administered 2022-04-06: 50 ug via INTRAVENOUS

## 2022-04-06 MED ORDER — BUPIVACAINE HCL 0.5 % IJ SOLN
INTRAMUSCULAR | Status: DC | PRN
  Administered 2022-04-06: 9 mL

## 2022-04-06 SURGICAL SUPPLY — 41 items
BLADE SURG 15 STRL LF DISP TIS (BLADE) ×2 IMPLANT
BLADE SURG 15 STRL SS (BLADE) ×2
BLADE SURG MINI STRL (BLADE) ×1 IMPLANT
BNDG CMPR 75X21 PLY HI ABS (MISCELLANEOUS) ×1
BNDG ELASTIC 4X5.8 VLCR STR LF (GAUZE/BANDAGES/DRESSINGS) IMPLANT
BNDG ESMARK 4X12 TAN STRL LF (GAUZE/BANDAGES/DRESSINGS) ×1 IMPLANT
BNDG GAUZE DERMACEA FLUFF 4 (GAUZE/BANDAGES/DRESSINGS) IMPLANT
BNDG GZE 12X3 1 PLY HI ABS (GAUZE/BANDAGES/DRESSINGS) ×1
BNDG GZE DERMACEA 4 6PLY (GAUZE/BANDAGES/DRESSINGS) ×1
BNDG STRETCH GAUZE 3IN X12FT (GAUZE/BANDAGES/DRESSINGS) IMPLANT
CNTNR SPEC 2.5X3XGRAD LEK (MISCELLANEOUS) ×1
CONT SPEC 4OZ STER OR WHT (MISCELLANEOUS) ×1
CONT SPEC 4OZ STRL OR WHT (MISCELLANEOUS) ×1
CONTAINER SPEC 2.5X3XGRAD LEK (MISCELLANEOUS) IMPLANT
DRSG XEROFORM 1X8 (GAUZE/BANDAGES/DRESSINGS) IMPLANT
DURAPREP 26ML APPLICATOR (WOUND CARE) ×1 IMPLANT
ELECT REM PT RETURN 9FT ADLT (ELECTROSURGICAL) ×1
ELECTRODE REM PT RTRN 9FT ADLT (ELECTROSURGICAL) ×1 IMPLANT
GAUZE SPONGE 4X4 12PLY STRL (GAUZE/BANDAGES/DRESSINGS) IMPLANT
GAUZE STRETCH 2X75IN STRL (MISCELLANEOUS) ×1 IMPLANT
GLOVE BIO SURGEON STRL SZ7.5 (GLOVE) ×1 IMPLANT
GLOVE SURG UNDER LTX SZ8 (GLOVE) ×1 IMPLANT
GOWN STRL REUS W/ TWL LRG LVL3 (GOWN DISPOSABLE) ×2 IMPLANT
GOWN STRL REUS W/TWL LRG LVL3 (GOWN DISPOSABLE) ×2
KIT TURNOVER KIT A (KITS) ×1 IMPLANT
LABEL OR SOLS (LABEL) ×1 IMPLANT
MANIFOLD NEPTUNE II (INSTRUMENTS) ×1 IMPLANT
NDL FILTER BLUNT 18X1 1/2 (NEEDLE) ×1 IMPLANT
NDL HYPO 25X1 1.5 SAFETY (NEEDLE) ×2 IMPLANT
NEEDLE FILTER BLUNT 18X1 1/2 (NEEDLE) ×1 IMPLANT
NEEDLE HYPO 25X1 1.5 SAFETY (NEEDLE) ×2 IMPLANT
NS IRRIG 500ML POUR BTL (IV SOLUTION) ×1 IMPLANT
PACK EXTREMITY ARMC (MISCELLANEOUS) ×1 IMPLANT
SOL PREP PVP 2OZ (MISCELLANEOUS) ×1
SOLUTION PREP PVP 2OZ (MISCELLANEOUS) ×1 IMPLANT
STOCKINETTE STRL 6IN 960660 (GAUZE/BANDAGES/DRESSINGS) ×1 IMPLANT
SUT ETHILON 3-0 FS-10 30 BLK (SUTURE) ×1
SUTURE EHLN 3-0 FS-10 30 BLK (SUTURE) ×1 IMPLANT
SYR 10ML LL (SYRINGE) ×1 IMPLANT
TRAP FLUID SMOKE EVACUATOR (MISCELLANEOUS) ×1 IMPLANT
WATER STERILE IRR 500ML POUR (IV SOLUTION) ×1 IMPLANT

## 2022-04-06 NOTE — Op Note (Signed)
Date of operation: 05/07/2022.  Surgeon: [name] D.P.M.  Preoperative diagnosis: Osteomyelitis left second toe.  Postoperative diagnosis: Same.  Procedure: Amputation left second toe at the metatarsal phalangeal joint.  Anesthesia: LMA with local.  Hemostasis: Esmarch tourniquet left ankle 250 mmHg.  Hemostasis: Less than 5 cc.  Pathology: Left second toe.  Cultures: Bone culture distal phalanx left second toe.  Complications: None apparent.  Operative indications: This is a 61 year old poorly controlled diabetic with neuropathy and history of previous amputation with recent development of ulceration on the left second toe.  Infection noted to extend down to the level of the bone and decision was made for amputation of the second toe.  Operative procedure: Patient was taken to the operating room and placed on the table in the supine position.  Following satisfactory LMA anesthesia the left forefoot was anesthetized with 9 cc of 0.5% Marcaine plain around the second metatarsal.  The foot was prepped and draped in usual sterile fashion and the foot was exsanguinated using an Esmarch which was then left intact around the ankle to act as a tourniquet.  Attention was directed to the distal left forefoot where an elliptical incision was made from dorsal to plantar around the base of the second toe.  Incision carried sharply down to the level of the bone and dissection carried back along the periosteum to the level of the joint where the toe was disarticulated and removed in toto.  A portion of bone from the distal phalanx was taken to be sent for culture and the toe passed off for pathology.  The wound was flushed with copious amounts of sterile saline and closed using 3-0 nylon vertical mattress and simple interrupted sutures.  Xeroform 4 x 4's and conformer applied to the left foot.  Tourniquet was released.  Kerlix and Ace wrap then applied to the left foot for compression.  Patient was  awakened and transported to the PACU having tolerated the anesthesia and procedures well.

## 2022-04-06 NOTE — Discharge Instructions (Addendum)
1.  Keep the bandage on the left foot clean, dry, and do not remove.  2.  Sponge bathe only left lower extremity.  3.  Elevate the left lower extremity on pillows.  4.  Wear the surgical shoe on the left foot whenever walking or standing.  5.  Take 1 pain pill, Norco, every 4 hours only if needed for pain.    AMBULATORY SURGERY  DISCHARGE INSTRUCTIONS   The drugs that you were given will stay in your system until tomorrow so for the next 24 hours you should not:  Drive an automobile Make any legal decisions Drink any alcoholic beverage   You may resume regular meals tomorrow.  Today it is better to start with liquids and gradually work up to solid foods.  You may eat anything you prefer, but it is better to start with liquids, then soup and crackers, and gradually work up to solid foods.   Please notify your doctor immediately if you have any unusual bleeding, trouble breathing, redness and pain at the surgery site, drainage, fever, or pain not relieved by medication.     Your post-operative visit with Dr.                                       is: Date:                        Time:    Please call to schedule your post-operative visit.  Additional Instructions:

## 2022-04-06 NOTE — Interval H&P Note (Signed)
History and Physical Interval Note:  04/06/2022 11:30 AM  Timothy [name].  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  The various methods of treatment have been discussed with the patient and family. After consideration of risks, benefits and other options for treatment, the patient has consented to  Procedure(s): AMPUTATION TOE (Left) as a surgical intervention.  The patient's history has been reviewed, patient examined, no change in status, stable for surgery.  I have reviewed the patient's chart and labs.  Questions were answered to the patient's satisfaction.     [name]

## 2022-04-06 NOTE — H&P (Signed)
Subjective: Patient presents today for amputation of his left second toe.  Objective: Pulses palpable.  There is loss of sensation in the feet and toes distally.  Hammertoe contracture of the left second toe with full-thickness ulceration probing to the level of the bone at the distal phalanx.  Some surrounding erythema and edema.  X-rays in the office reveal cortical destruction at the distal aspect of the distal phalanx on the second toe.  Assessment: Hammertoe with full-thickness ulceration and osteomyelitis left second toe.  Plan: Medical history and physical in the chart reviewed.  Clinical exam stable as compared to previous outpatient visit.  Patient is stable to proceed with surgery for amputation of the left second toe.

## 2022-04-06 NOTE — Anesthesia Preprocedure Evaluation (Signed)
Anesthesia Evaluation  Patient identified by MRN, date of birth, ID band Patient awake    Reviewed: Allergy & Precautions, NPO status , Patient's Chart, lab work & pertinent test results  History of Anesthesia Complications Negative for: history of anesthetic complications  Airway Mallampati: III  TM Distance: >3 FB Neck ROM: full    Dental  (+) Chipped   Pulmonary sleep apnea , COPD,  COPD inhaler and oxygen dependent, Current Smoker,  82 pack years  S/p VATS 08/15/20 w/ h/o pleural empyema  Chronic respiratory failure with hypoxia oxygen at 3 L/min   Pulmonary exam normal        Cardiovascular hypertension, On Medications + CAD, + Peripheral Vascular Disease and +CHF  Normal cardiovascular exam  TTE 05/2021 IMPRESSIONS    1. Left ventricular ejection fraction, by estimation, is 55 to 60%. The  left ventricle has normal function. The left ventricle has no regional  wall motion abnormalities. There is mild left ventricular hypertrophy.  Left ventricular diastolic parameters  are consistent with Grade I diastolic dysfunction (impaired relaxation).  The average left ventricular global longitudinal strain is -13.4 %. The  global longitudinal strain is abnormal.  2. Right ventricular systolic function is normal. The right ventricular  size is normal. Tricuspid regurgitation signal is inadequate for assessing  PA pressure.  3. Left atrial size was mild to moderately dilated.  4. The mitral valve is normal in structure. Mild mitral valve  regurgitation. No evidence of mitral stenosis.  5. The aortic valve is tricuspid. Aortic valve regurgitation is not  visualized. Aortic valve sclerosis is present, with no evidence of aortic  valve stenosis.  6. The inferior vena cava is normal in size with greater than 50%  respiratory variability, suggesting right atrial pressure of 3 mmHg.    Neuro/Psych PSYCHIATRIC DISORDERS  Anxiety Depression  Neuromuscular disease    GI/Hepatic Neg liver ROS, GERD  ,  Endo/Other  negative endocrine ROSdiabetes, Poorly Controlled, Type 2, Oral Hypoglycemic Agents, Insulin DependentHgbA1c 11.1  Preop BG 405   Renal/GU      Musculoskeletal  (+) Arthritis , osteomyelitis    Abdominal   Peds  Hematology negative hematology ROS (+) Hx of DVT on Eliquis    Anesthesia Other Findings Past Medical History: No date: Anxiety No date: COPD (chronic obstructive pulmonary disease) (HCC) No date: Depression No date: Diabetes mellitus without complication (HCC) No date: GERD (gastroesophageal reflux disease) No date: Hypercholesteremia No date: Hypertension No date: Pneumonia  Past Surgical History: 03/22/2020: AMPUTATION; Left     Comment:  Procedure: AMPUTATION RAY-1st Ray Left Foot;  Surgeon:               [name], [name];  Location: [facility];  Service:               Podiatry;  Laterality: Left; 03/16/2020: AMPUTATION TOE; Left     Comment:  Procedure: AMPUTATION GREAT TOE;  Surgeon: [name],               [name];  Location: [facility];  Service: Podiatry;                Laterality: Left; 12/22/2019: COLONOSCOPY WITH PROPOFOL; N/A     Comment:  Procedure: COLONOSCOPY WITH PROPOFOL;  Surgeon: [name], MD;  Location: [facility];  Service:               Endoscopy;  Laterality: N/A; 08/11/2021: INCISION AND DRAINAGE ABSCESS; Left     Comment:  Procedure: INCISION AND DRAINAGE ABSCESS;  Surgeon:               Beverly Gust, MD;  Location: [facility];  Service: ENT;              Laterality: Left; 03/18/2020: LOWER EXTREMITY ANGIOGRAPHY; Left     Comment:  Procedure: Lower Extremity Angiography;  Surgeon:               [name], MD;  Location: [facility];               Service: Cardiovascular;  Laterality: Left; No date: TONSILLECTOMY 08/15/2020: VIDEO ASSISTED THORACOSCOPY (VATS)/DECORTICATION; Left     Comment:  Procedure: VIDEO  ASSISTED THORACOSCOPY               (VATS)/DECORTICATION;  Surgeon: [name], MD;              Location: [place];  Service: Thoracic;  Laterality: Left; No date: WISDOM TOOTH EXTRACTION  BMI    Body Mass Index: 27.98 kg/m      Reproductive/Obstetrics negative OB ROS                            Anesthesia Physical Anesthesia Plan  ASA: 4  Anesthesia Plan: General LMA   Post-op Pain Management: Dilaudid IV   Induction: Intravenous  PONV Risk Score and Plan: 3 and Ondansetron, Midazolam and Treatment may vary due to age or medical condition  Airway Management Planned: Oral ETT  Additional Equipment:   Intra-op Plan:   Post-operative Plan: Extubation in OR  Informed Consent: I have reviewed the patients History and Physical, chart, labs and discussed the procedure including the risks, benefits and alternatives for the proposed anesthesia with the patient or authorized representative who has indicated his/her understanding and acceptance.     Dental Advisory Given  Plan Discussed with: Anesthesiologist, CRNA and Surgeon  Anesthesia Plan Comments: (Patient consented for risks of anesthesia including but not limited to:  - adverse reactions to medications - damage to eyes, teeth, lips or other oral mucosa - nerve damage due to positioning  - sore throat or hoarseness - Damage to heart, brain, nerves, lungs, other parts of body or loss of life - discussed increase risks associated with poorly controlled DM/perioperative hyperglycemia such as impaired wound healing, sepsis, stroke, MI, death.   Patient voiced understanding.)       Anesthesia Quick Evaluation

## 2022-04-06 NOTE — Anesthesia Postprocedure Evaluation (Signed)
Anesthesia Post Note  Patient: Timothy [name].  Procedure(s) Performed: AMPUTATION TOE (Left: Toe)  Patient location during evaluation: PACU Anesthesia Type: General Level of consciousness: awake and alert Pain management: pain level controlled Vital Signs Assessment: post-procedure vital signs reviewed and stable Respiratory status: spontaneous breathing, nonlabored ventilation, respiratory function stable and patient connected to nasal cannula oxygen Cardiovascular status: blood pressure returned to baseline and stable Postop Assessment: no apparent nausea or vomiting Anesthetic complications: no   No notable events documented.   Last Vitals:  Vitals:   04/06/22 1430 04/06/22 1445  BP: 130/77 133/73  Pulse: 66 66  Resp: 18 20  Temp:  (!) 36.2 C  SpO2: 96% 96%    Last Pain:  Vitals:   04/06/22 1445  TempSrc:   PainSc: 0-No pain                 Ilene Qua

## 2022-04-06 NOTE — Anesthesia Procedure Notes (Signed)
Procedure Name: LMA Insertion Date/Time: 04/06/2022 1:26 PM  Performed by: Beverely Low, CRNAPre-anesthesia Checklist: Patient identified, Patient being monitored, Timeout performed, Emergency Drugs available and Suction available Patient Re-evaluated:Patient Re-evaluated prior to induction Oxygen Delivery Method: Circle system utilized Preoxygenation: Pre-oxygenation with 100% oxygen Induction Type: IV induction Ventilation: Mask ventilation without difficulty LMA: LMA inserted LMA Size: 5.0 Tube type: Oral Number of attempts: 1 Placement Confirmation: positive ETCO2 and breath sounds checked- equal and bilateral Tube secured with: Tape Dental Injury: Teeth and Oropharynx as per pre-operative assessment

## 2022-04-06 NOTE — Transfer of Care (Addendum)
Immediate Anesthesia Transfer of Care Note  Patient: Timothy [name].  Procedure(s) Performed: AMPUTATION TOE (Left: Toe)  Patient Location: PACU  Anesthesia Type:General  Level of Consciousness: drowsy  Airway & Oxygen Therapy: Patient Spontanous Breathing and Patient connected to face mask oxygen  Post-op Assessment: Report given to RN and Post -op Vital signs reviewed and stable  Post vital signs: Reviewed and stable  Last Vitals:  Vitals Value Taken Time  BP    Temp    Pulse    Resp    SpO2      Last Pain:  Vitals:   04/06/22 1148  TempSrc: Temporal  PainSc: 0-No pain         Complications: No notable events documented.

## 2022-04-07 ENCOUNTER — Encounter: Payer: Self-pay | Admitting: Podiatry

## 2022-04-10 LAB — SURGICAL PATHOLOGY

## 2022-04-11 LAB — AEROBIC/ANAEROBIC CULTURE W GRAM STAIN (SURGICAL/DEEP WOUND): Gram Stain: NONE SEEN

## 2022-04-27 ENCOUNTER — Ambulatory Visit (HOSPITAL_COMMUNITY): Payer: No Payment, Other | Admitting: Clinical

## 2022-05-08 ENCOUNTER — Encounter (HOSPITAL_COMMUNITY): Payer: Self-pay | Admitting: Psychiatry

## 2022-05-08 ENCOUNTER — Ambulatory Visit (INDEPENDENT_AMBULATORY_CARE_PROVIDER_SITE_OTHER): Payer: No Payment, Other | Admitting: Psychiatry

## 2022-05-08 VITALS — BP 146/82 | HR 74 | Ht 70.0 in | Wt 209.0 lb

## 2022-05-08 DIAGNOSIS — F4310 Post-traumatic stress disorder, unspecified: Secondary | ICD-10-CM | POA: Diagnosis not present

## 2022-05-08 DIAGNOSIS — F332 Major depressive disorder, recurrent severe without psychotic features: Secondary | ICD-10-CM

## 2022-05-08 DIAGNOSIS — F411 Generalized anxiety disorder: Secondary | ICD-10-CM | POA: Insufficient documentation

## 2022-05-08 MED ORDER — VENLAFAXINE HCL ER 150 MG PO CP24: 150.0000 mg | ORAL_CAPSULE | Freq: Every day | ORAL | 2 refills | Status: DC

## 2022-05-08 MED ORDER — CLONIDINE HCL 0.1 MG PO TABS: 0.1000 mg | ORAL_TABLET | Freq: Every evening | ORAL | 2 refills | Status: DC

## 2022-05-08 MED ORDER — ARIPIPRAZOLE 10 MG PO TABS: 10.0000 mg | ORAL_TABLET | Freq: Every day | ORAL | 2 refills | Status: DC
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
## 2022-05-08 NOTE — Patient Instructions (Signed)
Thank you for attending your appointment today.  -- INCREASE Effexor to 150 mg daily -- MOVE Abilify 10 mg to morning time -- Continue other medications as prescribed.  Please do not make any changes to medications without first discussing with your provider. If you are experiencing a psychiatric emergency, please call 911 or present to your nearest emergency department. Additional crisis, medication management, and therapy resources are included below.  [facility]  [address], [place], [place] [address] [phone] WALK-IN URGENT CARE 24/7 FOR ANYONE [address], [place], [place]  [phone] Fax: [phone] [email or web address] *Interpreters available *Accepts all insurance and uninsured for Urgent Care needs *Accepts Medicaid and uninsured for outpatient treatment (below)      ONLY FOR [facility]  Below:    Outpatient New Patient Assessment/Therapy Walk-ins:        Monday -Thursday 8am until slots are full.        Every Friday 1pm-4pm  (first come, first served)                   New Patient Psychiatry/Medication Management        Monday-Friday 8am-11am (first come, first served)               For all walk-ins we ask that you arrive by 7:15am, because patients will be seen in the order of arrival.

## 2022-05-18 ENCOUNTER — Encounter: Payer: Self-pay | Admitting: Pulmonary Disease

## 2022-05-18 ENCOUNTER — Ambulatory Visit (INDEPENDENT_AMBULATORY_CARE_PROVIDER_SITE_OTHER): Payer: Medicaid Other | Admitting: Pulmonary Disease

## 2022-05-18 VITALS — BP 140/90 | HR 76 | Temp 98.3°F | Ht 70.0 in | Wt 209.0 lb

## 2022-05-18 DIAGNOSIS — F17210 Nicotine dependence, cigarettes, uncomplicated: Secondary | ICD-10-CM | POA: Diagnosis not present

## 2022-05-18 DIAGNOSIS — J440 Chronic obstructive pulmonary disease with acute lower respiratory infection: Secondary | ICD-10-CM | POA: Diagnosis not present

## 2022-05-18 DIAGNOSIS — J449 Chronic obstructive pulmonary disease, unspecified: Secondary | ICD-10-CM | POA: Diagnosis not present

## 2022-05-18 DIAGNOSIS — J209 Acute bronchitis, unspecified: Secondary | ICD-10-CM

## 2022-05-18 DIAGNOSIS — J9611 Chronic respiratory failure with hypoxia: Secondary | ICD-10-CM

## 2022-05-18 MED ORDER — DOXYCYCLINE HYCLATE 100 MG PO TABS: 100.0000 mg | ORAL_TABLET | Freq: Two times a day (BID) | ORAL | 0 refills | Status: AC

## 2022-05-18 NOTE — Progress Notes (Signed)
Subjective:    Patient ID: Timothy Sill., male    DOB: 09/24/1960, 61 y.o.   MRN: [id number] Patient Care Team: Nira Retort as PCP - General Debbe Odea, MD as PCP - Cardiology (Cardiology)  Chief Complaint  Patient presents with   Follow-up    SOB with exertion. Little wheezing. Cough with brownish sputum.    HPI Timothy [name] is a 61 year old current smoker (5 to 10 cigarettes/day) for follow-up on the issue of COPD and dyspnea.  This is a schedule appointment.  He was last seen on 07 September 2021.  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  Presents today with cough productive of brownish urine.  No hemoptysis.  Some increased shortness of breath and wheezing.  Endorses dyspnea over baseline.  No extremity edema or calf tenderness.  No chest pain.  No fevers, chills or sweats.  He does not endorse any other symptomatology.  DATA: 06/16/2020 PFT: FEV1 1.54 L or 42% predicted, FVC 2.54 L or 52% predicted, FEV1/FVC 61%, there is bronchodilator response with net change of 14% on FEV1 postbronchodilator.  There is concomitant restrictive physiology.  There is fluttering of the flow-volume loop consistent with possible OSA.  Diffusion capacity moderately reduced. 06/17/2019 one 2D echo: LVEF 60 to 65%, mildly dilated LA, no diastolic dysfunction. 08/21/2020 chest CT: Residual left empyema/pleural rind. 10/25/2020 nuclear stress test: Normal, no evidence of ischemia 05/30/2021 PFTs: FEV1 3.64 L or 65% predicted, FVC 4.80 L or 59% of predicted, FEV1/FVC 76%, lung volumes show air trapping and mild reduction.  Diffusion capacity severely impaired.  Is consistent with combined obstruction/restriction. Review of Systems A 10 point review of systems was performed and it is as noted above otherwise negative.  Patient Active Problem List   Diagnosis Date Noted   Generalized anxiety disorder 05/08/2022   Facial abscess 08/08/2021   Facial  cellulitis 08/08/2021   Uncontrolled type 2 diabetes mellitus with hyperglycemia (HCC) 08/08/2021   COPD (chronic obstructive pulmonary disease) (HCC) 08/08/2021   Atherosclerosis of native arteries of the extremities with ulceration (HCC) 04/18/2021   DJD (degenerative joint disease), lumbosacral 04/18/2021   Leg pain 01/23/2021   Acute deep vein thrombosis (DVT) of left lower extremity (HCC) 01/23/2021   Atherosclerosis of native arteries of extremity with intermittent claudication (HCC) 01/22/2021   MDD (major depressive disorder), recurrent severe, without psychosis (HCC) 12/19/2020   Severe episode of recurrent major depressive disorder, with psychotic features (HCC)    COPD with acute exacerbation (HCC) 12/14/2020   Hx of sleep apnea 12/14/2020   Empyema lung (HCC) 08/15/2020   Type 2 diabetes mellitus with hyperglycemia (HCC) 08/11/2020   Respiratory failure with hypoxia (HCC) 08/11/2020   Hyponatremia 08/11/2020   Chronic diastolic CHF (congestive heart failure) (HCC) 08/11/2020   Anemia 08/11/2020   Abdominal pain 08/11/2020   Empyema (HCC) 08/10/2020   Osteomyelitis (HCC) 03/15/2020   Open wound of foot 01/20/2020   Elevated lipids 01/20/2020   Personal history of colonic polyps    Shortness of breath 11/04/2019   Encounter to establish care 10/15/2019   Essential hypertension 10/15/2019   Controlled diabetes mellitus type 2 with complications (HCC) 10/15/2019   Current smoker 10/15/2019   Peripheral neuropathy 10/15/2019   Cramp, abdominal 10/15/2019   History of hepatitis 10/15/2019   History of gastroesophageal reflux (GERD) 10/15/2019   Type 2 diabetes mellitus with diabetic neuropathy, unspecified (HCC) 10/15/2019   Post-traumatic stress disorder, unspecified 07/14/2019  Antisocial personality disorder (HCC) 07/14/2019   Social History   Tobacco Use   Smoking status: Every Day    Packs/day: 0.50    Years: 41.00    Total pack years: 20.50    Types:  Cigarettes   Smokeless tobacco: Never   Tobacco comments:    5-10 cigarettes daily- 05/18/2022  Substance Use Topics   Alcohol use: Yes    Alcohol/week: 6.0 standard drinks of alcohol    Types: 6 Cans of beer per week    Comment: 1-2 times weekly; 2 "40s"   Allergies  Allergen Reactions   Bee Venom Anaphylaxis   Cheese Anaphylaxis, Swelling and Other (See Comments)    Blue Cheese only   Coffea Arabica Shortness Of Breath   Coffee Flavor Shortness Of Breath   Medications reviewed with the patient.   There is no immunization history on file for this patient.   Objective:   Physical Exam BP (!) 140/90 (BP Location: Left Arm, Cuff Size: Large)   Pulse 76   Temp 98.3 F (36.8 C)   Ht 5\' 10"  (1.778 m)   Wt 209 lb (94.8 kg)   SpO2 98%   BMI 29.99 kg/m   SpO2: 98 % O2 Device: Nasal cannula O2 Flow Rate (L/min): 4 L/min O2 Type: Continuous O2  GENERAL: Well-developed well-nourished gentleman in no acute distress.  Ambulates with the assistance of a cane.  No conversational dyspnea.   HEAD: Normocephalic, atraumatic.  EYES: Pupils equal, round, reactive to light.  No scleral icterus. Injected conjunctiva. MOUTH: Exceedingly poor dentition, oral mucosa moist, no thrush. NECK: Supple. No thyromegaly. Trachea midline. No JVD.  No adenopathy. PULMONARY: Good air entry symmetrically bilaterally.  Few rhonchi noted.  No wheezes. CARDIOVASCULAR: S1 and S2. Regular rate and rhythm.  No rubs, murmurs or gallops heard. ABDOMEN: Benign. MUSCULOSKELETAL: No clubbing, no edema.   NEUROLOGIC: No focal deficit, speech is fluent, SKIN: Intact,warm,dry. PSYCH: Mood and behavior normal.  Attempted ambulation on room air.  Patient was only able to complete 1 lap.  At baseline oxygen saturations were 92% patient with a few steps decreased to 91% but could not complete any further due to issues with left foot pain.  On room air patient was maintaining O2 sats with supplemental oxygen on 2  L/min.     Assessment & Plan:     ICD-10-CM   1. Acute bronchitis with COPD (HCC)  J44.0    J20.9    100 mg twice daily x 7 days    2. Stage 3 severe COPD by GOLD classification (HCC)  J44.9    Continue Trelegy Ellipta 100 Continue as needed albuterol No overt exacerbation despite bronchitis    3. Chronic respiratory failure with hypoxia (HCC)  J96.11    Decrease O2 flow 2 L/min during rest and sleep O2 flow 2 to 3 L/min with ambulation Patient compliant with therapy    4. Tobacco dependence due to cigarettes  F17.210    Patient counseled regarding discontinuation of smoking Total counseling time 3 to 5 minutes     Meds ordered this encounter  Medications   doxycycline (VIBRA-TABS) 100 MG tablet    Sig: Take 1 tablet (100 mg total) by mouth 2 (two) times daily for 7 days.    Dispense:  14 tablet    Refill:  0   Smoking cessation instruction/counseling given:  counseled patient on the dangers of tobacco use, advised patient to stop smoking, and reviewed strategies to maximize success.  Will  see the patient in follow-up in 3 months time call sooner should symptoms worsen, fail to improve or any new issues arise in the interim.  Gailen Shelter, MD Advanced Bronchoscopy P[facility]    *This note was dictated using voice recognition software/Dragon.  Despite best efforts to proofread, errors can occur which can change the meaning. Any transcriptional errors that result from this process are unintentional and may not be fully corrected at the time of dictation.

## 2022-05-18 NOTE — Patient Instructions (Signed)
We have sent an antibiotic to your pharmacy.  Will be taken twice a day.  Continue your medications as you are currently doing.  For your oxygen therapy you can do anywhere between 2 to 3 L when you walk.  Continue using 2 L at nighttime with sleep.  We will see you in follow-up in 3 months time call sooner should any new problems arise.

## 2022-05-19 ENCOUNTER — Telehealth (HOSPITAL_COMMUNITY): Payer: Self-pay

## 2022-05-28 ENCOUNTER — Ambulatory Visit (INDEPENDENT_AMBULATORY_CARE_PROVIDER_SITE_OTHER): Payer: Medicaid Other | Admitting: Licensed Clinical Social Worker

## 2022-05-28 ENCOUNTER — Encounter (HOSPITAL_COMMUNITY): Payer: Self-pay | Admitting: Licensed Clinical Social Worker

## 2022-05-28 ENCOUNTER — Encounter (HOSPITAL_COMMUNITY): Payer: Self-pay

## 2022-05-28 DIAGNOSIS — F411 Generalized anxiety disorder: Secondary | ICD-10-CM

## 2022-05-28 DIAGNOSIS — F319 Bipolar disorder, unspecified: Secondary | ICD-10-CM | POA: Diagnosis not present

## 2022-05-28 DIAGNOSIS — F4310 Post-traumatic stress disorder, unspecified: Secondary | ICD-10-CM
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
## 2022-06-05 ENCOUNTER — Encounter (HOSPITAL_COMMUNITY): Payer: No Payment, Other | Admitting: Psychiatry

## 2022-06-14 ENCOUNTER — Ambulatory Visit (INDEPENDENT_AMBULATORY_CARE_PROVIDER_SITE_OTHER): Payer: Medicaid Other | Admitting: Vascular Surgery

## 2022-06-14 ENCOUNTER — Encounter (INDEPENDENT_AMBULATORY_CARE_PROVIDER_SITE_OTHER): Payer: Medicaid Other

## 2022-06-27 ENCOUNTER — Other Ambulatory Visit: Payer: Self-pay | Admitting: Primary Care

## 2022-06-27 ENCOUNTER — Ambulatory Visit (INDEPENDENT_AMBULATORY_CARE_PROVIDER_SITE_OTHER): Payer: Medicaid Other | Admitting: Primary Care

## 2022-06-27 ENCOUNTER — Ambulatory Visit
Admission: RE | Admit: 2022-06-27 | Discharge: 2022-06-27 | Disposition: A | Discharge status: Home / Self Care | Payer: Medicaid Other | Source: Ambulatory Visit | Attending: Primary Care | Admitting: Primary Care

## 2022-06-27 ENCOUNTER — Ambulatory Visit
Admission: RE | Admit: 2022-06-27 | Discharge: 2022-06-27 | Disposition: A | Discharge status: Home / Self Care | Payer: Medicaid Other | Attending: Primary Care | Admitting: Primary Care

## 2022-06-27 ENCOUNTER — Encounter: Payer: Self-pay | Admitting: Primary Care

## 2022-06-27 VITALS — BP 120/78 | HR 63 | Temp 96.6°F | Ht 70.0 in | Wt 204.2 lb

## 2022-06-27 DIAGNOSIS — S2241XA Multiple fractures of ribs, right side, initial encounter for closed fracture: Secondary | ICD-10-CM | POA: Insufficient documentation

## 2022-06-27 DIAGNOSIS — J209 Acute bronchitis, unspecified: Secondary | ICD-10-CM

## 2022-06-27 DIAGNOSIS — J9611 Chronic respiratory failure with hypoxia: Secondary | ICD-10-CM | POA: Diagnosis not present

## 2022-06-27 DIAGNOSIS — Y929 Unspecified place or not applicable: Secondary | ICD-10-CM | POA: Insufficient documentation

## 2022-06-27 DIAGNOSIS — F17200 Nicotine dependence, unspecified, uncomplicated: Secondary | ICD-10-CM

## 2022-06-27 DIAGNOSIS — J440 Chronic obstructive pulmonary disease with acute lower respiratory infection: Secondary | ICD-10-CM | POA: Diagnosis not present

## 2022-06-27 DIAGNOSIS — W19XXXA Unspecified fall, initial encounter: Secondary | ICD-10-CM

## 2022-06-27 DIAGNOSIS — R0781 Pleurodynia: Secondary | ICD-10-CM

## 2022-06-27 DIAGNOSIS — Y939 Activity, unspecified: Secondary | ICD-10-CM | POA: Insufficient documentation

## 2022-06-27 DIAGNOSIS — J441 Chronic obstructive pulmonary disease with (acute) exacerbation: Secondary | ICD-10-CM

## 2022-06-27 MED ORDER — BREZTRI AEROSPHERE 160-9-4.8 MCG/ACT IN AERO: 2.0000 | INHALATION_SPRAY | Freq: Two times a day (BID) | RESPIRATORY_TRACT | 0 refills | Status: DC

## 2022-06-27 MED ORDER — MUCINEX DM 30-600 MG PO TB12: 1.0000 | ORAL_TABLET | Freq: Two times a day (BID) | ORAL | 0 refills | Status: DC | PRN

## 2022-06-27 MED ORDER — PREDNISONE 10 MG PO TABS: ORAL_TABLET | ORAL | 0 refills | Status: DC

## 2022-06-27 NOTE — Patient Instructions (Addendum)
Recommendations: - Stop Trelegy - Start [employer] tomorrow January 11th - take 2 puffs every morning and evening - Continue Albuterol 2 puffs every 4-6 hours as needed  - Start mucinex-dm 600mg  tablet morning and evening for 1-2 weeks (prescription sent) - Take prednisone taper as directed (prescription sent) - Will get a chest/rib x-ray today - Taper current amount you are smoking by 1 cigarette a week, goal is to be down to 3-4 cigarettes at next visit and then I want to discuss picking a quit date   Orders: - CXR today  Rx: - Mucinex - Prednisone   Follow-up: - 4 weeks with Dr. Patsey Berthold

## 2022-06-27 NOTE — Progress Notes (Signed)
@Patient  ID: Timothy Main., male    DOB: 1960/10/18, 61 y.o.   MRN: [id number]  Chief Complaint  Patient presents with   Follow-up    [place] 3-4 days ago and has pain in the right side of his chest. SOB constant. Has been waking up because he "stops breathing" and when he tries to go back to sleep he "stops breathing."    Referring provider: Ellene Route  HPI: 62 year old male, current every day smoker. PMH significant for COPD, chronic diastolic heart failure, hypertension, empyema lung, respiratory failure with hypoxia, type 2 diabetes.  Patient of Dr. Patsey Berthold, last seen in office on 10/20/2020.  Previous [facility] pulmonary encounter: 10/20/20- Dr. Patsey Berthold  Patient is a 62 year old current smoker (half PPD) who presents for follow-up on COPD and dyspnea.  This is a scheduled appointment.  He was initially evaluated here in December 2021 with a subsequent visit in July 28, 2020.  At that time he had been on Trelegy Ellipta.  On February 23 he noticed that he had been having fevers chills increased shortness of breath and presented to the emergency room.  He was noted to have a large left pleural effusion that was loculated.  Empyema was confirmed and patient was transferred to [facility] for definitive surgical treatment.  He underwent video-assisted thoracoscopy with VATS decortication on 28 February.  He was discharged from [facility] on 8 March.  He did receive IV antibiotics as the organism was Streptococcus intermedius.  He was discharged on 3 L nasal cannula O2.  Since that time he has been decreasing his use of O2.  He actually presented to the clinic without O2 today.  He continues to have issues with shortness of breath.  His most recent chest x-ray shows significant pleural scarring on the left.  This will of course worsen his already compromised lung function.  Additionally he is going to have a stress test on 10 May 2 complete cardiac evaluation.  Some of his dyspnea may be  related to cardiac disease.  He unfortunately continues to smoke half pack of cigarettes per day.   12/14/2020  Patient presents today for 6 week follow-up. Hx emphysema lungs, streptococcus immitis. He had VATS pleurodesis in February 2022 and prolonged IV antibiotics therapy. Residual scarring expected. He was discharged on 3L oxygen and has been decreasing his use. Currently using as needed and at night. During last visit patient was changed from Trelegy to [employer]. He had a normal stress test in May with cardiology.   He is still awaiting prescription from medication management for Stiolto. He feels this worked better but had to resume trelegy in the mean time. He currently has a cough which started two weeks ago, he is getting up white-tan mucus. He is not taking any over the counter medication for cough. Wheezing is not bad during the day but worsens at night. He reports hx sleep apnea and daytime sleepiness. Epworth score 19. He is still smoking, he has cut back from 1-1.5 ppd to 5 cigarettes.   Sleep questionnaire: Previous sleep study- patient reports hx sleep apnea  Symptoms- Snoring, choking, restless sleep, daytimes sleepiness  Bedtime- 10-11pm Nocturnal awakenings- 4-6 times Time our of bed in morning- 7-8am Weight- up 20-30 lbs  Epworth- 19   04/19/2021 Patient presents today for 3 month follow-up. Patient developed head cold in early October which never cleared. He received no treatment. He still has a congested cough with brown mucus. Associated shortness of breath. He  experiences chest tightness and wheezing when laying down at night. He is maintained on Stiolto and is using Albuterol 7-10x/day. He wears 3L oxygen at night and prn. Denies f/c/s. Needs CXR    06/27/2022- Interim hx Patient presents today for acute visit/shortness fo breath. Hx COPD, respiratory failure, empyema lungs, HTN, diastolic heart failure.   Patient reports falling 3-4 days ago when changing light bulb  in [place] outside. He did not have his cane with him at that time. He fell on the ground onto his right side. He did not hit his head or loose consciousness. Since then he has been having pain on his right side and increased shortness of breath. He is taking Trelegy as directed daily. Using albuterol up to 6 times a day. He has has a productive cough for the last 4 weeks. He is getting up tan mucus. He completed course of doxycycline earlier this month. Wears 3L oxygen at bedtime.   Allergies  Allergen Reactions   Bee Venom Anaphylaxis   Cheese Anaphylaxis, Swelling and Other (See Comments)    Blue Cheese only   Coffea Arabica Shortness Of Breath   Coffee Flavor Shortness Of Breath     There is no immunization history on file for this patient.  Past Medical History:  Diagnosis Date   Anxiety    COPD (chronic obstructive pulmonary disease) (HCC)    Depression    Diabetes mellitus without complication (HCC)    GERD (gastroesophageal reflux disease)    Hypercholesteremia    Hypertension    Pneumonia    PTSD (post-traumatic stress disorder)     Tobacco History: Social History   Tobacco Use  Smoking Status Every Day   Packs/day: 0.50   Years: 41.00   Total pack years: 20.50   Types: Cigarettes  Smokeless Tobacco Never  Tobacco Comments   5-7 cigarettes daily- 06/27/2022   Ready to quit: Not Answered Counseling given: Not Answered Tobacco comments: 5-7 cigarettes daily- 06/27/2022   Outpatient Medications Prior to Visit  Medication Sig Dispense Refill   acetaminophen (TYLENOL) 650 MG CR tablet Take 1,300 mg by mouth every 8 (eight) hours as needed for pain.     albuterol (VENTOLIN HFA) 108 (90 Base) MCG/ACT inhaler INHALE 1 PUFF INTO THE LUNGS ONCE EVERY 6 HOURS AS NEEDED FOR SHORTNESS OF BREATH OR WHEEZING. 18 g 1   amLODipine (NORVASC) 5 MG tablet Take 1 tablet (5 mg total) by mouth once daily. 30 tablet 1   apixaban (ELIQUIS) 5 MG TABS tablet TAKE ONE TABLET (5MG  TOTAL)  BY MOUTH TWICE DAILY. 60 tablet 1   ARIPiprazole (ABILIFY) 10 MG tablet Take 1 tablet (10 mg total) by mouth daily. 30 tablet 2   blood glucose meter kit and supplies KIT Dispense based on patient and insurance preference. Use up to four times daily as directed. (FOR ICD-9 250.00, 250.01). 1 each 0   blood glucose meter kit and supplies KIT Dispense based on patient and insurance preference. Use up to four times daily as directed. 1 each 0   cloNIDine (CATAPRES) 0.1 MG tablet Take 1 tablet (0.1 mg total) by mouth at bedtime. 30 tablet 2   cyanocobalamin (VITAMIN B12) 1000 MCG tablet Take 1,000 mcg by mouth daily.     gabapentin (NEURONTIN) 400 MG capsule TAKE ONE CAPSULE (400MG  TOTAL) BY MOUTH THREE TIMES DAILY. (Patient taking differently: Take by mouth 4 (four) times daily.) 90 capsule 1   gentamicin ointment (GARAMYCIN) 0.1 % Apply topically in the  morning and at bedtime. To left side of face. 15 g 0   glipiZIDE (GLUCOTROL) 10 MG tablet TAKE ONE TABLET (10MG  TOTAL) BY MOUTH TWICE DAILY. 30 tablet 1   insulin glargine (LANTUS) 100 UNIT/ML Solostar Pen Inject 20 Units into the skin at bedtime. (Patient taking differently: Inject 38 Units into the skin daily. In AM) 15 mL 3   Insulin Pen Needle (PEN NEEDLES) 32G X 6 MM MISC 1 each by Does not apply route daily. 30 each 2   magnesium oxide (MAG-OX) 400 MG tablet Take 800 mg by mouth 2 (two) times daily.     metFORMIN (GLUCOPHAGE) 1000 MG tablet TAKE ONE TABLET (1,000MG  TOTAL) BY MOUTH TWICE DAILY. 60 tablet 1   metoprolol succinate (TOPROL-XL) 50 MG 24 hr tablet Take 1 tablet by mouth daily.     pantoprazole (PROTONIX) 40 MG tablet TAKE ONE TABLET (40MG  TOTAL) BY MOUTH ONCE DAILY. 30 tablet 1   rosuvastatin (CRESTOR) 5 MG tablet TAKE ONE TABLET (5MG  TOTAL) BY MOUTH ONCE DAILY. 30 tablet 1   venlafaxine XR (EFFEXOR XR) 150 MG 24 hr capsule Take 1 capsule (150 mg total) by mouth daily with breakfast. 30 capsule 2   Fluticasone-Umeclidin-Vilant (TRELEGY  ELLIPTA) 100-62.5-25 MCG/ACT AEPB Inhale 1 puff into the lungs daily. 28 each 11   docusate sodium (COLACE) 100 MG capsule Take 1 tablet once or twice daily as needed for constipation while taking narcotic pain medicine (Patient not taking: Reported on 05/08/2022) 30 capsule 0   HYDROcodone-acetaminophen (NORCO/VICODIN) 5-325 MG tablet Take 1 tablet by mouth every 4 (four) hours as needed for moderate pain. (Patient not taking: Reported on 05/08/2022) 20 tablet 0   No facility-administered medications prior to visit.    Review of Systems  Review of Systems  Constitutional: Negative.   Respiratory:  Positive for cough and shortness of breath.   Cardiovascular: Negative.   Musculoskeletal:        Right sided rib pain   Physical Exam  BP 120/78 (BP Location: Left Arm, Cuff Size: Normal)   Pulse 63   Temp (!) 96.6 F (35.9 C)   Ht 5\' 10"  (1.778 m)   Wt 204 lb 3.2 oz (92.6 kg)   SpO2 94%   BMI 29.30 kg/m  Physical Exam Constitutional:      Appearance: Normal appearance.  HENT:     Head: Normocephalic and atraumatic.  Cardiovascular:     Rate and Rhythm: Normal rate and regular rhythm.  Pulmonary:     Effort: Pulmonary effort is normal.     Comments: +dry cough Musculoskeletal:        General: Normal range of motion.     Comments: Right rib cage tender to palpitation; No structural abnormality or bruising   Skin:    General: Skin is warm and dry.  Neurological:     General: No focal deficit present.     Mental Status: He is alert. Mental status is at baseline.  Psychiatric:        Mood and Affect: Mood normal.        Behavior: Behavior normal.        Thought Content: Thought content normal.        Judgment: Judgment normal.      Lab Results:  CBC    Component Value Date/Time   WBC 5.9 01/25/2022 1921   RBC 4.90 01/25/2022 1921   HGB 15.9 01/25/2022 1921   HGB 14.6 06/22/2020 1058   HCT 45.9 01/25/2022 1921  HCT 41.8 06/22/2020 1058   PLT 269 01/25/2022 1921    PLT 301 06/22/2020 1058   MCV 93.7 01/25/2022 1921   MCV 96 06/22/2020 1058   MCH 32.4 01/25/2022 1921   MCHC 34.6 01/25/2022 1921   RDW 13.2 01/25/2022 1921   RDW 13.1 06/22/2020 1058   LYMPHSABS 2.1 01/25/2022 1921   LYMPHSABS 1.2 06/22/2020 1058   MONOABS 0.6 01/25/2022 1921   EOSABS 0.1 01/25/2022 1921   EOSABS 0.1 06/22/2020 1058   BASOSABS 0.1 01/25/2022 1921   BASOSABS 0.0 06/22/2020 1058    BMET    Component Value Date/Time   NA 136 01/25/2022 1921   NA 134 06/22/2020 1058   K 4.2 01/25/2022 1921   CL 100 01/25/2022 1921   CO2 26 01/25/2022 1921   GLUCOSE 300 (H) 01/25/2022 1921   BUN 13 01/25/2022 1921   BUN 8 06/22/2020 1058   CREATININE 1.01 01/25/2022 1921   CALCIUM 9.5 01/25/2022 1921   GFRNONAA >60 01/25/2022 1921   GFRAA 107 06/22/2020 1058    BNP    Component Value Date/Time   BNP 222.7 (H) 08/10/2020 1842    ProBNP No results found for: "PROBNP"  Imaging: DG Ribs Unilateral W/Chest Left  Result Date: 06/27/2022 CLINICAL DATA:  Fall with rib pain EXAM: LEFT RIBS AND CHEST - 3+ VIEW COMPARISON:  CT 01/25/2022 FINDINGS: Left rib series demonstrates remote left fourth and fifth lateral rib fractures. No definite acute displaced left rib fracture is seen. Healing left fifth posterior rib fracture. IMPRESSION: Remote left fourth and fifth lateral rib fractures. Healing left posterior fifth rib fracture. No definite acute displaced left rib fracture. Electronically Signed   By: Donavan Foil M.D.   On: 06/27/2022 17:22   DG Ribs Unilateral W/Chest Right  Result Date: 06/27/2022 CLINICAL DATA:  Fall with bilateral rib pain EXAM: RIGHT RIBS AND CHEST - 3+ VIEW COMPARISON:  09/02/2020., CT 01/25/2022 FINDINGS: Single-view chest demonstrates no acute airspace disease.Tiny left pleural effusion or pleural scarring. Normal cardiac size. Aortic atherosclerosis. No pneumothorax Right rib series demonstrates chronic displaced right eleventh rib fracture. Age  indeterminate right ninth posterior rib fracture. Chronic appearing right seventh and eighth anterolateral rib fractures. IMPRESSION: 1. Age indeterminate right ninth posterior rib fracture. 2. Chronic appearing right seventh and eighth rib fractures. 3. Tiny left pleural effusion or pleural scarring. Electronically Signed   By: Donavan Foil M.D.   On: 06/27/2022 17:19     Assessment & Plan:   COPD with acute exacerbation ([facility]) - Increased dyspnea symptoms with associated chest tightness and wheezing. He has had a chronic productive cough > 4 weeks. Completed course of Doxycyline in early December without noticeable improvement. Using SABA >4-6 times a day. Checking CXR. Changing Trelegy to [employer] two puffs twice daily. Sending in prednisone taper. Advised patient start mucinex-dm 600mg  twice daily. Holding off on further abx until imaging comes back. FU in 4 weeks or sooner if needed.   Respiratory failure with hypoxia (HCC) - Stable; O2 94% RA  - Continue 3L supplemental oxygen at bedtime and with moderate exertion to keep O2 >88-90%   Fall - Patient tripped and fell three days ago, landing on the ground onto his right side. No LOC. No obvious injury or ecchymosis on exam. Checking rib xray.   Current smoker - Strong encourage patient quit smoking. We set a goal that he cut down to 3-4 cigarettes/day by next month    Martyn Ehrich, NP 06/27/2022

## 2022-06-27 NOTE — Assessment & Plan Note (Signed)
-   Strong encourage patient quit smoking. We set a goal that he cut down to 3-4 cigarettes/day by next month

## 2022-06-27 NOTE — Assessment & Plan Note (Signed)
-   Patient tripped and fell three days ago, landing on the ground onto his right side. No LOC. No obvious injury or ecchymosis on exam. Checking rib xray.

## 2022-06-27 NOTE — Assessment & Plan Note (Addendum)
-   Increased dyspnea symptoms with associated chest tightness and wheezing. He has had a chronic productive cough > 4 weeks. Completed course of Doxycyline in early December without noticeable improvement. Using SABA >4-6 times a day. Checking CXR. Changing Trelegy to [employer] two puffs twice daily. Sending in prednisone taper. Advised patient start mucinex-dm 600mg  twice daily. Holding off on further abx until imaging comes back. FU in 4 weeks or sooner if needed.

## 2022-06-27 NOTE — Assessment & Plan Note (Addendum)
-   Stable; O2 94% RA  - Continue 3L supplemental oxygen at bedtime and with moderate exertion to keep O2 >88-90%

## 2022-06-28 NOTE — Progress Notes (Signed)
Please let patient know he has age indeterminate right 9th posterior rib fracture, chronic right 7th and 8th rib fractures. Tiny pleural effusion/scarring.   Please make sue patient is following with PCP- should have bone testing for osteoporosis

## 2022-06-28 NOTE — Progress Notes (Signed)
He also has remote left 4th and 5th rib fractures. Healing left 5th rib fracture.   No definite acute or displaced fractures

## 2022-07-02 ENCOUNTER — Encounter (HOSPITAL_COMMUNITY): Payer: Self-pay

## 2022-07-02 ENCOUNTER — Ambulatory Visit (HOSPITAL_COMMUNITY): Payer: No Payment, Other | Admitting: Licensed Clinical Social Worker

## 2022-07-10 ENCOUNTER — Other Ambulatory Visit (INDEPENDENT_AMBULATORY_CARE_PROVIDER_SITE_OTHER): Payer: Self-pay | Admitting: Vascular Surgery

## 2022-07-10 DIAGNOSIS — I7025 Atherosclerosis of native arteries of other extremities with ulceration: Secondary | ICD-10-CM

## 2022-07-11 NOTE — Progress Notes (Signed)
MRN : [id number]  Timothy [name]. is a 62 y.o. (1960/07/05) male who presents with chief complaint of check circulation.  History of Present Illness:   The patient returns to the office for followup and review of the noninvasive studies.   The patient notes that there has been a significant deterioration in the lower extremity symptoms.  The patient notes interval shortening of their claudication distance and development of mild rest pain symptoms. No new ulcers or wounds have occurred since the last visit.  There have been no significant changes to the patient's overall health care.  The patient denies amaurosis fugax or recent TIA symptoms. There are no recent neurological changes noted. There is no history of DVT, PE or superficial thrombophlebitis. The patient denies recent episodes of angina or shortness of breath.   ABI's Rt=0.98 and Lt=0.96 (previous ABI's Rt=1.00 and Lt=1.17)  No outpatient medications have been marked as taking for the 07/12/22 encounter (Appointment) with Delana Meyer, Dolores Lory, MD.    Past Medical History:  Diagnosis Date   Anxiety    COPD (chronic obstructive pulmonary disease) ([place])    Depression    Diabetes mellitus without complication ([place])    GERD (gastroesophageal reflux disease)    Hypercholesteremia    Hypertension    Pneumonia    PTSD (post-traumatic stress disorder)     Past Surgical History:  Procedure Laterality Date   AMPUTATION Left 03/22/2020   Procedure: AMPUTATION RAY-1st Ray Left Foot;  Surgeon: [name], [name];  Location: [facility] ORS;  Service: Podiatry;  Laterality: Left;   AMPUTATION TOE Left 03/16/2020   Procedure: AMPUTATION GREAT TOE;  Surgeon: [name], [name];  Location: [facility] OR[facility];  Service: Podiatry;  Laterality: Left;   AMPUTATION TOE Left 04/06/2022   Procedure: AMPUTATION TOE;  Surgeon: [name], [name];  Location: [facility] OR[facility];  Service: Podiatry;  Laterality: Left;   COLONOSCOPY WITH PROPOFOL N/A  12/22/2019   Procedure: COLONOSCOPY WITH PROPOFOL;  Surgeon: [name], MD;  Location: [facility] ENDOSCOPY;  Service: Endoscopy;  Laterality: N/A;   INCISION AND DRAINAGE ABSCESS Left 08/11/2021   Procedure: INCISION AND DRAINAGE ABSCESS;  Surgeon: Beverly Gust, MD;  Location: [facility] ORS;  Service: ENT;  Laterality: Left;   LOWER EXTREMITY ANGIOGRAPHY Left 03/18/2020   Procedure: Lower Extremity Angiography;  Surgeon: [name], MD;  Location: [facility];  Service: Cardiovascular;  Laterality: Left;   TONSILLECTOMY     VIDEO ASSISTED THORACOSCOPY (VATS)/DECORTICATION Left 08/15/2020   Procedure: VIDEO ASSISTED THORACOSCOPY (VATS)/DECORTICATION;  Surgeon: [name], MD;  Location: [facility] OR;  Service: Thoracic;  Laterality: Left;   WISDOM TOOTH EXTRACTION      Social History Social History   Tobacco Use   Smoking status: Every Day    Packs/day: 0.50    Years: 41.00    Total pack years: 20.50    Types: Cigarettes   Smokeless tobacco: Never   Tobacco comments:    5-7 cigarettes daily- 06/27/2022  Vaping Use   Vaping Use: Never used  Substance Use Topics   Alcohol use: Yes    Alcohol/week: 6.0 standard drinks of alcohol    Types: 6 Cans of beer per week    Comment: 1-2 times weekly; 4 "40s"   Drug use: Not Currently    Family History Family History  Family history unknown: Yes    Allergies  Allergen Reactions   Bee Venom Anaphylaxis  Cheese Anaphylaxis, Swelling and Other (See Comments)    Blue Cheese only   Coffea Arabica Shortness Of Breath   Coffee Flavor Shortness Of Breath     REVIEW OF SYSTEMS (Negative unless checked)  Constitutional: [] Weight loss  [] Fever  [] Chills Cardiac: [] Chest pain   [] Chest pressure   [] Palpitations   [] Shortness of breath when laying flat   [] Shortness of breath with exertion. Vascular:  [x] Pain in legs with walking   [] Pain in legs at rest  [] History of DVT   [] Phlebitis   [] Swelling in legs   [] Varicose veins    [] Non-healing ulcers Pulmonary:   [] Uses home oxygen   [] Productive cough   [] Hemoptysis   [] Wheeze  [x] COPD   [] Asthma Neurologic:  [] Dizziness   [] Seizures   [] History of stroke   [] History of TIA  [] Aphasia   [] Vissual changes   [] Weakness or numbness in arm   [] Weakness or numbness in leg Musculoskeletal:   [] Joint swelling   [x] Joint pain   [x] Low back pain Hematologic:  [] Easy bruising  [] Easy bleeding   [] Hypercoagulable state   [] Anemic Gastrointestinal:  [] Diarrhea   [] Vomiting  [x] Gastroesophageal reflux/heartburn   [] Difficulty swallowing. Genitourinary:  [] Chronic kidney disease   [] Difficult urination  [] Frequent urination   [] Blood in urine Skin:  [] Rashes   [] Ulcers  Psychological:  [] History of anxiety   []  History of major depression.  Physical Examination  There were no vitals filed for this visit. There is no height or weight on file to calculate BMI. Gen: WD/WN, NAD Head: [place]/AT, No temporalis wasting.  Ear/Nose/Throat: Hearing grossly intact, nares w/o erythema or drainage Eyes: PER, EOMI, sclera nonicteric.  Neck: Supple, no masses.  No bruit or JVD.  Pulmonary:  Good air movement, no audible wheezing, no use of accessory muscles.  Cardiac: RRR, normal S1, S2, no Murmurs. Vascular:  mild trophic changes, no open wounds Vessel Right Left  Radial Palpable Palpable  PT Not Palpable Not Palpable  DP Trace Palpable Not Palpable  Gastrointestinal: soft, non-distended. No guarding/no peritoneal signs.  Musculoskeletal: M/S 5/5 throughout.  No visible deformity.  Neurologic: CN 2-12 intact. Pain and light touch intact in extremities.  Symmetrical.  Speech is fluent. Motor exam as listed above. Psychiatric: Judgment intact, Mood & affect appropriate for pt's clinical situation. Dermatologic: No rashes or ulcers noted.  No changes consistent with cellulitis.   CBC Lab Results  Component Value Date   WBC 5.9 01/25/2022   HGB 15.9 01/25/2022   HCT 45.9 01/25/2022   MCV  93.7 01/25/2022   PLT 269 01/25/2022    BMET    Component Value Date/Time   NA 136 01/25/2022 1921   NA 134 06/22/2020 1058   K 4.2 01/25/2022 1921   CL 100 01/25/2022 1921   CO2 26 01/25/2022 1921   GLUCOSE 300 (H) 01/25/2022 1921   BUN 13 01/25/2022 1921   BUN 8 06/22/2020 1058   CREATININE 1.01 01/25/2022 1921   CALCIUM 9.5 01/25/2022 1921   GFRNONAA >60 01/25/2022 1921   GFRAA 107 06/22/2020 1058   CrCl cannot be calculated (Patient's most recent lab result is older than the maximum 21 days allowed.).  COAG Lab Results  Component Value Date   INR 1.2 08/14/2020   INR 0.9 03/16/2020    Radiology DG Ribs Unilateral W/Chest Left  Result Date: 06/27/2022 CLINICAL DATA:  Fall with rib pain EXAM: LEFT RIBS AND CHEST - 3+ VIEW COMPARISON:  CT 01/25/2022 FINDINGS: Left rib series demonstrates remote  left fourth and fifth lateral rib fractures. No definite acute displaced left rib fracture is seen. Healing left fifth posterior rib fracture. IMPRESSION: Remote left fourth and fifth lateral rib fractures. Healing left posterior fifth rib fracture. No definite acute displaced left rib fracture. Electronically Signed   By: Donavan Foil M.D.   On: 06/27/2022 17:22   DG Ribs Unilateral W/Chest Right  Result Date: 06/27/2022 CLINICAL DATA:  Fall with bilateral rib pain EXAM: RIGHT RIBS AND CHEST - 3+ VIEW COMPARISON:  09/02/2020., CT 01/25/2022 FINDINGS: Single-view chest demonstrates no acute airspace disease.Tiny left pleural effusion or pleural scarring. Normal cardiac size. Aortic atherosclerosis. No pneumothorax Right rib series demonstrates chronic displaced right eleventh rib fracture. Age indeterminate right ninth posterior rib fracture. Chronic appearing right seventh and eighth anterolateral rib fractures. IMPRESSION: 1. Age indeterminate right ninth posterior rib fracture. 2. Chronic appearing right seventh and eighth rib fractures. 3. Tiny left pleural effusion or pleural  scarring. Electronically Signed   By: Donavan Foil M.D.   On: 06/27/2022 17:19     Assessment/Plan 1. Atherosclerosis of native arteries of the extremities with ulceration ([place])  Recommend:  His ulceration has resolved.   The patient has evidence of atherosclerosis of the lower extremities with claudication.  The patient does not voice lifestyle limiting changes at this point in time.  Noninvasive studies do not suggest clinically significant change.  No invasive studies, angiography or surgery at this time The patient should continue walking and begin a more formal exercise program.  The patient should continue antiplatelet therapy and aggressive treatment of the lipid abnormalities  No changes in the patient's medications at this time  Continued surveillance is indicated as atherosclerosis is likely to progress with time.    The patient will continue follow up with noninvasive studies as ordered.  - VAS [place] ABI WITH/WO TBI - VAS [place] ABI WITH/WO TBI; Future  2. Essential hypertension Continue antihypertensive medications as already ordered, these medications have been reviewed and there are no changes at this time.  3. Chronic obstructive pulmonary disease, unspecified COPD type ([place]) Continue pulmonary medications and aerosols as already ordered, these medications have been reviewed and there are no changes at this time.   4. Controlled type 2 diabetes mellitus with complication, without long-term current use of insulin (HCC) Continue hypoglycemic medications as already ordered, these medications have been reviewed and there are no changes at this time.  Hgb A1C to be monitored as already arranged by primary service  5. DJD (degenerative joint disease), lumbosacral Continue NSAID medications as already ordered, these medications have been reviewed and there are no changes at this time.  Continued activity and therapy was stressed.    Hortencia Pilar,  MD  07/11/2022 11:07 AM

## 2022-07-12 ENCOUNTER — Encounter (INDEPENDENT_AMBULATORY_CARE_PROVIDER_SITE_OTHER): Payer: Self-pay | Admitting: Vascular Surgery

## 2022-07-12 ENCOUNTER — Ambulatory Visit (INDEPENDENT_AMBULATORY_CARE_PROVIDER_SITE_OTHER): Payer: Medicaid Other | Admitting: Vascular Surgery

## 2022-07-12 ENCOUNTER — Ambulatory Visit (INDEPENDENT_AMBULATORY_CARE_PROVIDER_SITE_OTHER): Payer: Medicaid Other

## 2022-07-12 VITALS — BP 95/65 | HR 63 | Ht 70.0 in | Wt 204.0 lb

## 2022-07-12 DIAGNOSIS — I7025 Atherosclerosis of native arteries of other extremities with ulceration: Secondary | ICD-10-CM | POA: Diagnosis not present

## 2022-07-12 DIAGNOSIS — E118 Type 2 diabetes mellitus with unspecified complications: Secondary | ICD-10-CM | POA: Diagnosis not present

## 2022-07-12 DIAGNOSIS — M47817 Spondylosis without myelopathy or radiculopathy, lumbosacral region: Secondary | ICD-10-CM

## 2022-07-12 DIAGNOSIS — I10 Essential (primary) hypertension: Secondary | ICD-10-CM | POA: Diagnosis not present

## 2022-07-12 DIAGNOSIS — J449 Chronic obstructive pulmonary disease, unspecified: Secondary | ICD-10-CM

## 2022-07-14 ENCOUNTER — Encounter (INDEPENDENT_AMBULATORY_CARE_PROVIDER_SITE_OTHER): Payer: Self-pay | Admitting: Vascular Surgery

## 2022-07-16 LAB — VAS US ABI WITH/WO TBI
Left ABI: 0.96
Right ABI: 0.98

## 2022-07-23 ENCOUNTER — Ambulatory Visit (INDEPENDENT_AMBULATORY_CARE_PROVIDER_SITE_OTHER): Payer: Medicaid Other | Admitting: Licensed Clinical Social Worker

## 2022-07-23 DIAGNOSIS — F319 Bipolar disorder, unspecified: Secondary | ICD-10-CM | POA: Diagnosis not present

## 2022-07-23 DIAGNOSIS — F4310 Post-traumatic stress disorder, unspecified: Secondary | ICD-10-CM

## 2022-07-23 NOTE — Progress Notes (Signed)
THERAPIST PROGRESS NOTE  Session Time: 20   Participation Level: Active  Behavioral Response: CasualAlertAnxious and Depressed  Type of Therapy: Individual Therapy  Treatment Goals addressed:  Active     BH CCP Acute or Chronic Trauma Reaction     LTG: Recall traumatic events without becoming overwhelmed with negative emotions     Start:  05/28/22    Expected End:  01/16/23         STG: Cletus Gash will attend at least 80% of scheduled follow-up medication management appointments     Start:  05/28/22    Expected End:  01/16/23         STG: Carl will cooperate with and complete psychological testing to help evaluate trauma symptoms     Start:  05/28/22    Expected End:  01/16/23         STG: Benuel will identify internal and external stimuli that trigger PTSD symptoms     Start:  05/28/22    Expected End:  01/16/23         STG: Montarius will acknowledge that healing from PTSD is a gradual process     Start:  05/28/22    Expected End:  01/16/23         STG: Hazim will identify negative coping strategies that have been used to cope with the feelings associated with the trauma     Start:  05/28/22    Expected End:  01/16/23         STG: Ziggy will identify coping strategies to deal with trauma memories and the associated emotional reaction     Start:  05/28/22    Expected End:  01/16/23           BH CCP BIPOLAR DISORDER-MANIA/HYPOMANIA     STG: Fitz will cooperate with a psychiatric evaluation on 08/20/22 and during all follow-up visits     Start:  05/28/22    Expected End:  01/16/23         STG: Tykeem will identify cognitive patterns and beliefs that interfere with therapy (Progressing)     Start:  05/28/22    Expected End:  01/16/23         LTG: Decrease PHQ-9 below 10  (Progressing)     Start:  05/28/22    Expected End:  01/16/23         STG: Decrease GAD-7 below 5  (Progressing)     Start:  05/28/22    Expected End:  01/16/23                ProgressTowards Goals: Progressing  Interventions: CBT and Motivational Interviewing  Summary: [name] is a 62 y.o. male who presents with depressed and anxious mood\affect.  Patient was pleasant, cooperative, maintained good eye contact.  He engaged well in therapy session was dressed casually.  Patient reports today decrease in stress with roommate.  He attributes this to one of his roommates who he considers a "alcoholic" moving out of the house that they were staying in.  Patient reports that this has caused some residual effects in his mental health as he now has more isolation and less socialization.  Patient does report That alcohol has overall decreased due to roommate moving out  and not providing pt with alcohol.   Suicidal/Homicidal: Nowithout intent/plan  Therapist Response:    Intervention/Plan: LCSW administered PHQ-9.  LCSW notes a decrease from 22-17.  LCSW reviewed scores with patient.  LCSW spoke with patient  about coping skills for decreasing anxiety such as listening to music.  LCSW educated patient on taking medications as prescribed.  LCSW educated patient on the benefits of taking medications.  LCSW used supportive therapy for praise and encouragement.  LCSW psychoanalytic therapy for patient to express thoughts, feelings and emotions in session.   Plan: Return again in 3 weeks.  Diagnosis: Bipolar disorder with psychotic features ([place])  Post-traumatic stress disorder, unspecified  Collaboration of Care: Other None today    Patient/Guardian was advised Release of Information must be obtained prior to any record release in order to collaborate their care with an outside provider. Patient/Guardian was advised if they have not already done so to contact the registration department to sign all necessary forms in order for [place] to release information regarding their care.   Consent: Patient/Guardian gives verbal consent for treatment and assignment of  benefits for services provided during this visit. Patient/Guardian expressed understanding and agreed to proceed.   Dory Horn, LCSW 07/23/2022

## 2022-08-09 ENCOUNTER — Encounter: Payer: Self-pay | Admitting: Pulmonary Disease

## 2022-08-09 ENCOUNTER — Ambulatory Visit (INDEPENDENT_AMBULATORY_CARE_PROVIDER_SITE_OTHER): Payer: Medicaid Other | Admitting: Pulmonary Disease

## 2022-08-09 VITALS — BP 120/82 | HR 86 | Temp 97.7°F | Ht 70.0 in | Wt 196.0 lb

## 2022-08-09 DIAGNOSIS — J209 Acute bronchitis, unspecified: Secondary | ICD-10-CM | POA: Diagnosis not present

## 2022-08-09 DIAGNOSIS — J440 Chronic obstructive pulmonary disease with acute lower respiratory infection: Secondary | ICD-10-CM | POA: Diagnosis not present

## 2022-08-09 DIAGNOSIS — J449 Chronic obstructive pulmonary disease, unspecified: Secondary | ICD-10-CM

## 2022-08-09 DIAGNOSIS — F17210 Nicotine dependence, cigarettes, uncomplicated: Secondary | ICD-10-CM | POA: Diagnosis not present

## 2022-08-09 MED ORDER — BREZTRI AEROSPHERE 160-9-4.8 MCG/ACT IN AERO: 2.0000 | INHALATION_SPRAY | Freq: Two times a day (BID) | RESPIRATORY_TRACT | 11 refills | Status: DC

## 2022-08-09 MED ORDER — AZITHROMYCIN 250 MG PO TABS: ORAL_TABLET | ORAL | 0 refills | Status: DC

## 2022-08-09 NOTE — Patient Instructions (Signed)
Your lungs sounded good today.  I sent a short course of antibiotic to see if we can clear that discoloration in your sputum.  Make efforts to quit smoking.  We will see you in follow-up in 4 to 6 months time call sooner should any new problems arise.

## 2022-08-09 NOTE — Progress Notes (Signed)
Subjective:    Patient ID: Timothy [name]., male    DOB: [birth year], 62 y.o.   MRN: [id number] Patient Care Team: Ellene Route as PCP - General Kate Sable, MD as PCP - Cardiology (Cardiology)  Chief Complaint  Patient presents with   Follow-up    SOB with exertion. Cough with brown sputum. Wheezing with stairs or laying on his side.    HPI Patient is a 62 year old current smoker (half PPD) who presents for follow-up on COPD and dyspnea.  This is a scheduled appointment.  Last visit here was on 18 May 2022.  At that time he was still requiring oxygen.  He was last seen by Geraldo Pitter, NP on 27 June 2022 for an acute visit due to increased shortness of breath.  This had occurred after a fall.  He was noted at that time to have 3 rib fractures on the right.  He noted pleural scarring unchanged.  Today he notes that his chest pain and shortness of breath have improved.  He does have some cough productive of yellowish to greenish sputum.  He does not endorse any other symptomatology since his most recent visit.  No fevers, chills or sweats.  No orthopnea or paroxysmal nocturnal dyspnea, no lower extremity edema or calf tenderness.  He unfortunately continues to smoke half pack of cigarettes per day.   DATA: 06/16/2020 PFT: FEV1 1.54 L or 42% predicted, FVC 2.54 L or 52% predicted, FEV1/FVC 61%, there is bronchodilator response with net change of 14% on FEV1 postbronchodilator.  There is concomitant restrictive physiology.  There is fluttering of the flow-volume loop consistent with possible OSA.  Diffusion capacity moderately reduced. 06/17/2019 one 2D echo: LVEF 60 to 65%, mildly dilated LA, no diastolic dysfunction. 08/21/2020 chest CT: Residual left empyema/pleural rind. 10/25/2020 nuclear stress test: Normal, no evidence of ischemia 05/30/2021 PFTs: FEV1 3.64 L or 65% predicted, FVC 4.80 L or 59% of predicted, FEV1/FVC 76%, lung volumes show air trapping and  mild reduction.  Diffusion capacity severely impaired.  Is consistent with combined obstruction/restriction.  Review of Systems A 10 point review of systems was performed and it is as noted above otherwise negative.  Patient Active Problem List   Diagnosis Date Noted   Fall 06/27/2022   Bipolar disorder with psychotic features ([place]) 05/28/2022   Generalized anxiety disorder 05/08/2022   Facial abscess 08/08/2021   Facial cellulitis 08/08/2021   Uncontrolled type 2 diabetes mellitus with hyperglycemia ([place]) 08/08/2021   COPD (chronic obstructive pulmonary disease) ([place]) 08/08/2021   Atherosclerosis of native arteries of the extremities with ulceration ([place]) 04/18/2021   DJD (degenerative joint disease), lumbosacral 04/18/2021   Leg pain 01/23/2021   Acute deep vein thrombosis (DVT) of left lower extremity ([place]) 01/23/2021   Atherosclerosis of native arteries of extremity with intermittent claudication ([place]) 01/22/2021   MDD (major depressive disorder), recurrent severe, without psychosis ([place]) 12/19/2020   Severe episode of recurrent major depressive disorder, with psychotic features ([place])    COPD with acute exacerbation ([place]) 12/14/2020   Hx of sleep apnea 12/14/2020   Empyema lung ([place]) 08/15/2020   Type 2 diabetes mellitus with hyperglycemia ([place]) 08/11/2020   Respiratory failure with hypoxia ([place]) 08/11/2020   Hyponatremia 08/11/2020   Chronic diastolic CHF (congestive heart failure) ([place]) 08/11/2020   Anemia 08/11/2020   Abdominal pain 08/11/2020   Empyema ([place]) 08/10/2020   Osteomyelitis ([place]) 03/15/2020   Open wound of foot 01/20/2020   Elevated lipids 01/20/2020   Personal history  of colonic polyps    Shortness of breath 11/04/2019   Encounter to establish care 10/15/2019   Essential hypertension 10/15/2019   Controlled diabetes mellitus type 2 with complications ([place]) 123456   Current smoker 10/15/2019   Peripheral neuropathy 10/15/2019   Cramp, abdominal  10/15/2019   History of hepatitis 10/15/2019   History of gastroesophageal reflux (GERD) 10/15/2019   Type 2 diabetes mellitus with diabetic neuropathy, unspecified ([place]) 10/15/2019   Post-traumatic stress disorder, unspecified 07/14/2019   Antisocial personality disorder ([place]) 07/14/2019   Social History   Tobacco Use   Smoking status: Every Day    Packs/day: 0.50    Years: 41.00    Total pack years: 20.50    Types: Cigarettes   Smokeless tobacco: Never   Tobacco comments:    5-10 cigarettes daily- 08/09/2022  Substance Use Topics   Alcohol use: Yes    Alcohol/week: 6.0 standard drinks of alcohol    Types: 6 Cans of beer per week    Comment: 1-2 times weekly; 4 "40s"   Allergies  Allergen Reactions   Bee Venom Anaphylaxis   Cheese Anaphylaxis, Swelling and Other (See Comments)    Blue Cheese only   Coffea Arabica Shortness Of Breath   Coffee Flavor Shortness Of Breath   Current Meds  Medication Sig   acetaminophen (TYLENOL) 650 MG CR tablet Take 1,300 mg by mouth every 8 (eight) hours as needed for pain.   albuterol (VENTOLIN HFA) 108 (90 Base) MCG/ACT inhaler INHALE 1 PUFF INTO THE LUNGS ONCE EVERY 6 HOURS AS NEEDED FOR SHORTNESS OF BREATH OR WHEEZING.   amLODipine (NORVASC) 5 MG tablet Take 1 tablet (5 mg total) by mouth once daily.   apixaban (ELIQUIS) 5 MG TABS tablet TAKE ONE TABLET ('5MG'$  TOTAL) BY MOUTH TWICE DAILY.   blood glucose meter kit and supplies KIT Dispense based on patient and insurance preference. Use up to four times daily as directed. (FOR ICD-9 250.00, 250.01).   blood glucose meter kit and supplies KIT Dispense based on patient and insurance preference. Use up to four times daily as directed.   Budeson-Glycopyrrol-Formoterol (BREZTRI AEROSPHERE) 160-9-4.8 MCG/ACT AERO Inhale 2 puffs into the lungs in the morning and at bedtime.   buPROPion HCl (WELLBUTRIN PO) Take 2 tablets by mouth daily.   cyanocobalamin (VITAMIN B12) 1000 MCG tablet Take 1,000 mcg by  mouth daily.   Dextromethorphan-guaiFENesin (MUCINEX DM) 30-600 MG TB12 Take 1 tablet by mouth 2 (two) times daily as needed (cough).   gabapentin (NEURONTIN) 400 MG capsule TAKE ONE CAPSULE ('400MG'$  TOTAL) BY MOUTH THREE TIMES DAILY. (Patient taking differently: Take by mouth 4 (four) times daily.)   gentamicin ointment (GARAMYCIN) 0.1 % Apply topically in the morning and at bedtime. To left side of face.   glipiZIDE (GLUCOTROL) 10 MG tablet TAKE ONE TABLET ('10MG'$  TOTAL) BY MOUTH TWICE DAILY.   insulin glargine (LANTUS) 100 UNIT/ML Solostar Pen Inject 20 Units into the skin at bedtime. (Patient taking differently: Inject 38 Units into the skin daily. In AM)   Insulin Pen Needle (PEN NEEDLES) 32G X 6 MM MISC 1 each by Does not apply route daily.   magnesium oxide (MAG-OX) 400 MG tablet Take 800 mg by mouth 2 (two) times daily.   metFORMIN (GLUCOPHAGE) 1000 MG tablet TAKE ONE TABLET (1,'000MG'$  TOTAL) BY MOUTH TWICE DAILY.   metoprolol succinate (TOPROL-XL) 50 MG 24 hr tablet Take 1 tablet by mouth daily.   pantoprazole (PROTONIX) 40 MG tablet TAKE ONE TABLET ('40MG'$  TOTAL)  BY MOUTH ONCE DAILY.   rosuvastatin (CRESTOR) 5 MG tablet TAKE ONE TABLET ('5MG'$  TOTAL) BY MOUTH ONCE DAILY.    There is no immunization history on file for this patient.     Objective:   Physical Exam BP 120/82 (BP Location: Left Arm, Cuff Size: Normal)   Pulse 86   Temp 97.7 F (36.5 C)   Ht '5\' 10"'$  (1.778 m)   Wt 196 lb (88.9 kg)   SpO2 97%   BMI 28.12 kg/m   SpO2: 97 % O2 Device: None (Room air)  GENERAL: Well-developed well-nourished gentleman in no acute distress.  Ambulates with the assistance of a cane.  No conversational dyspnea.   HEAD: Normocephalic, atraumatic.  EYES: Pupils equal, round, reactive to light.  No scleral icterus. Injected conjunctiva. MOUTH: Exceedingly poor dentition, oral mucosa moist, no thrush. NECK: Supple. No thyromegaly. Trachea midline. No JVD.  No adenopathy. PULMONARY: Good air entry  symmetrically bilaterally.  No adventitious sounds. CARDIOVASCULAR: S1 and S2. Regular rate and rhythm.  No rubs, murmurs or gallops heard. ABDOMEN: Benign. MUSCULOSKELETAL: No clubbing, no edema.   NEUROLOGIC: No focal deficit, speech is fluent, SKIN: Intact,warm,dry. PSYCH: Mood and behavior normal.     Assessment & Plan:     ICD-10-CM   1. Acute bronchitis with COPD ([place])  J44.0    J20.9    Patient was prescribed azithromycin    2. Stage 3 severe COPD by GOLD classification (HCC)  J44.9    Continue Breztri 2 puffs twice a day Continue as needed albuterol    3. Tobacco dependence due to cigarettes  F17.210    Patient counseled regards discontinuation of smoking Counseling time 3 to 5 minutes     Meds ordered this encounter  Medications   azithromycin (ZITHROMAX) 250 MG tablet    Sig: Take as directed in the package.    Dispense:  6 tablet    Refill:  0   Budeson-Glycopyrrol-Formoterol (BREZTRI AEROSPHERE) 160-9-4.8 MCG/ACT AERO    Sig: Inhale 2 puffs into the lungs in the morning and at bedtime.    Dispense:  10.7 g    Refill:  11   See the patient in follow-up in 4 to 6 months time he is to contact us prior to that time should any new difficulties arise.   Renold Don, MD Advanced Bronchoscopy [facility]    *This note was dictated using voice recognition software/Dragon.  Despite best efforts to proofread, errors can occur which can change the meaning. Any transcriptional errors that result from this process are unintentional and may not be fully corrected at the time of dictation.

## 2022-08-10 ENCOUNTER — Ambulatory Visit (INDEPENDENT_AMBULATORY_CARE_PROVIDER_SITE_OTHER): Payer: Medicaid Other | Admitting: Physician Assistant

## 2022-08-10 ENCOUNTER — Encounter (HOSPITAL_COMMUNITY): Payer: Self-pay | Admitting: Physician Assistant

## 2022-08-10 ENCOUNTER — Telehealth (HOSPITAL_COMMUNITY): Payer: Self-pay

## 2022-08-10 VITALS — BP 137/78 | HR 81 | Temp 98.2°F | Ht 70.0 in | Wt 197.0 lb

## 2022-08-10 DIAGNOSIS — F411 Generalized anxiety disorder: Secondary | ICD-10-CM

## 2022-08-10 DIAGNOSIS — F4310 Post-traumatic stress disorder, unspecified: Secondary | ICD-10-CM

## 2022-08-10 DIAGNOSIS — F332 Major depressive disorder, recurrent severe without psychotic features: Secondary | ICD-10-CM | POA: Diagnosis not present

## 2022-08-10 MED ORDER — VENLAFAXINE HCL ER 75 MG PO CP24: 75.0000 mg | ORAL_CAPSULE | Freq: Every day | ORAL | 2 refills | Status: DC

## 2022-08-10 MED ORDER — VENLAFAXINE HCL ER 150 MG PO CP24: 150.0000 mg | ORAL_CAPSULE | Freq: Every day | ORAL | 2 refills | Status: DC

## 2022-08-10 MED ORDER — ARIPIPRAZOLE 10 MG PO TABS: 10.0000 mg | ORAL_TABLET | Freq: Every day | ORAL | 2 refills | Status: DC

## 2022-08-10 MED ORDER — CLONIDINE HCL 0.1 MG PO TABS: 0.1000 mg | ORAL_TABLET | Freq: Every evening | ORAL | 2 refills | Status: DC
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
## 2022-08-10 NOTE — Telephone Encounter (Signed)
Medication management - telephone call with [facility]'s [facility] [employer] in [place], after calling [place] to get prior authorization for patient's prescribed Abilify. [id number] good until 08/05/23. [facility] verified this medication was  now being filled as prescribed.

## 2022-08-14 ENCOUNTER — Telehealth: Payer: Self-pay

## 2022-08-14 NOTE — Telephone Encounter (Signed)
PA request received via CMM for Breztri Aerosphere 160-9-4.8MCG/ACT aerosol  PA submitted through [employer] to [facility] Medicaid and is pending determination.  Confirmation: [id number]

## 2022-08-21 NOTE — Telephone Encounter (Signed)
PA has been APPROVED from 08/14/2022 - 08/14/2023

## 2022-08-28 ENCOUNTER — Encounter: Payer: Self-pay | Admitting: Pulmonary Disease

## 2022-08-29 ENCOUNTER — Ambulatory Visit (HOSPITAL_COMMUNITY): Payer: Medicaid Other | Admitting: Licensed Clinical Social Worker

## 2022-09-05 ENCOUNTER — Ambulatory Visit (INDEPENDENT_AMBULATORY_CARE_PROVIDER_SITE_OTHER): Payer: Medicaid Other | Admitting: Licensed Clinical Social Worker

## 2022-09-05 DIAGNOSIS — F4310 Post-traumatic stress disorder, unspecified: Secondary | ICD-10-CM

## 2022-09-05 DIAGNOSIS — F333 Major depressive disorder, recurrent, severe with psychotic symptoms: Secondary | ICD-10-CM | POA: Diagnosis not present

## 2022-09-05 NOTE — Progress Notes (Signed)
THERAPIST PROGRESS NOTE  Session Time: 76  Participation Level: Active  Behavioral Response: CasualAlertAnxious and Depressed  Type of Therapy: Individual Therapy  Treatment Goals addressed:  Active     BH CCP Acute or Chronic Trauma Reaction     LTG: Recall traumatic events without becoming overwhelmed with negative emotions     Start:  05/28/22    Expected End:  01/16/23         STG: Timothy [name] will attend at least 80% of scheduled follow-up medication management appointments (Progressing)     Start:  05/28/22    Expected End:  01/16/23         STG: Timothy [name] will cooperate with and complete psychological testing to help evaluate trauma symptoms (Progressing)     Start:  05/28/22    Expected End:  01/16/23         STG: Timothy [name] will identify internal and external stimuli that trigger PTSD symptoms     Start:  05/28/22    Expected End:  01/16/23         STG: Timothy [name] will acknowledge that healing from PTSD is a gradual process     Start:  05/28/22    Expected End:  01/16/23         STG: Timothy [name] will identify negative coping strategies that have been used to cope with the feelings associated with the trauma     Start:  05/28/22    Expected End:  01/16/23         STG: Timothy [name] will identify coping strategies to deal with trauma memories and the associated emotional reaction     Start:  05/28/22    Expected End:  01/16/23           BH CCP BIPOLAR DISORDER-MANIA/HYPOMANIA     STG: Timothy [name] will cooperate with a psychiatric evaluation on 08/20/22 and during all follow-up visits (Completed/Met)     Start:  05/28/22    Expected End:  01/16/23    Resolved:  09/05/22      STG: Timothy [name] will identify cognitive patterns and beliefs that interfere with therapy (Progressing)     Start:  05/28/22    Expected End:  01/16/23         LTG: Decrease PHQ-9 below 10  (Progressing)     Start:  05/28/22    Expected End:  01/16/23         STG: Decrease GAD-7 below 5  (Progressing)      Start:  05/28/22    Expected End:  01/16/23         Work with Timothy [name] to discuss risks and benefits of medication treatment options for this problem and prescribe as indicated      Start:  05/28/22       Intervention Note     Reviewed with patient during session.             ProgressTowards Goals: Progressing  Interventions: CBT, Motivational Interviewing, and Supportive  Summary: Timothy [name]. is a 62 y.o. male who presents with depressed and anxious mood\affect.  Patient was pleasant, cooperative, maintained good eye contact.  He engaged well in therapy session was dressed casually.  Taraji was alert and oriented x 5.  Patient comes in today with primary stressor of her roommate.  Patient reports that he is his roommates primary caregiver and in return he is able to live in roommate's house.  Patient reports tension in their relationship as he does not always agree with  how his roommate balances his finances and his expectations of how patient takes care of him.  Hans reports that he makes all his meals and does his laundry as well as keep up with house tasks.  Patient believes that that is how he should be taking care of of his roommate but at times they disagreed.  Suicidal/Homicidal: Nowithout intent/plan  Therapist Response:    Intervention/Plan: LCSW administered GAD-7.  LCSW administered a PHQ-9.  Both decreased from last PHQ-9 and GAD-7 taken 3 weeks ago by medication provider.  Patient is progressing towards goal of GAD-7.5 and PHQ-9 below a 10.  LCSW psychoanalytic therapy for patient to express thoughts, feelings and emotions in session.  LCSW supportive therapy for praise and encouragement.    Plan: Return again in 3 weeks.  Diagnosis: Severe episode of recurrent major depressive disorder, with psychotic features ([place])  Post-traumatic stress disorder, unspecified  Collaboration of Care: Other None today   Patient/Guardian was advised Release of Information  must be obtained prior to any record release in order to collaborate their care with an outside provider. Patient/Guardian was advised if they have not already done so to contact the registration department to sign all necessary forms in order for [place] to release information regarding their care.   Consent: Patient/Guardian gives verbal consent for treatment and assignment of benefits for services provided during this visit. Patient/Guardian expressed understanding and agreed to proceed.   Dory Horn, LCSW 09/05/2022

## 2022-09-17 ENCOUNTER — Ambulatory Visit (HOSPITAL_COMMUNITY): Payer: Medicaid Other | Admitting: Licensed Clinical Social Worker

## 2022-10-02 DIAGNOSIS — E782 Mixed hyperlipidemia: Secondary | ICD-10-CM | POA: Insufficient documentation

## 2022-10-03 ENCOUNTER — Ambulatory Visit (INDEPENDENT_AMBULATORY_CARE_PROVIDER_SITE_OTHER): Payer: Medicaid Other | Admitting: Licensed Clinical Social Worker

## 2022-10-03 DIAGNOSIS — F332 Major depressive disorder, recurrent severe without psychotic features: Secondary | ICD-10-CM | POA: Diagnosis not present

## 2022-10-03 NOTE — Progress Notes (Signed)
   THERAPIST PROGRESS NOTE  Session Time: 4  Participation Level: Active  Behavioral Response: CasualAlertAnxious and Depressed  Type of Therapy: Individual Therapy  Treatment Goals addressed:   ProgressTowards Goals: Progressing  Interventions: CBT, Motivational Interviewing, and Supportive  Summary: Timothy [name]. is a 62 y.o. male who presents with depressed and anxious mood\affect.  Patient was pleasant, cooperative, maintained good eye contact.  He engaged well in therapy session was dressed casually.  Patient reports primary stressors as waiting for determination on Social Security disability income and also foot ulcer.  Patient reports tension and worry due to his foot ulcer because of his diabetes and possibility of bone infection.  [name] reports that he has taking care of of his foot by wrapping it up and putting ointment on it as prescribed by his doctors.   Other stressors for patient are his Social Security income determination.  Patient reports that this has been a battle that has been ongoing since 2021.  Patient reports that he has multiple failed attempts and is hoping that this is the time where he can gain income which could result in him gaining independent living.  Suicidal/Homicidal: Nowithout intent/plan  Therapist Response:     Intervention/Plan: LCSW talked about coping skills for depression.  LCSW talked about coping skills for anxiety.  LCSW provided patient worksheets for coping skills for depression and anxiety by therapy [email or web address] patient to review she needs for next session and come up with 1 coping skill for anxiety and 1 coping skill for depression that he can administer into his daily life.  LCSW used psychoanalytic therapy for patient to express thoughts, feelings and emotions.  LCSW used supportive therapy for praise and encouragement.  LCSW used person centered therapy for empowerment.  Plan: Return again in 4 weeks.  Diagnosis: MDD (major  depressive disorder), recurrent severe, without psychosis  Collaboration of Care: Other None today   Patient/Guardian was advised Release of Information must be obtained prior to any record release in order to collaborate their care with an outside provider. Patient/Guardian was advised if they have not already done so to contact the registration department to sign all necessary forms in order for [place] to release information regarding their care.   Consent: Patient/Guardian gives verbal consent for treatment and assignment of benefits for services provided during this visit. Patient/Guardian expressed understanding and agreed to proceed.   Weber Cooks, LCSW 10/03/2022

## 2022-10-10 ENCOUNTER — Encounter (HOSPITAL_COMMUNITY): Payer: Medicaid Other | Admitting: Physician Assistant

## 2022-10-31 ENCOUNTER — Ambulatory Visit (HOSPITAL_COMMUNITY): Payer: Medicaid Other | Admitting: Licensed Clinical Social Worker

## 2022-11-01 ENCOUNTER — Ambulatory Visit (INDEPENDENT_AMBULATORY_CARE_PROVIDER_SITE_OTHER): Payer: Medicaid Other | Admitting: Vascular Surgery

## 2022-11-01 NOTE — Progress Notes (Deleted)
MRN : [id number]  Timothy [name]. is a 62 y.o. (June 28, 1960) male who presents with chief complaint of check circulation.  History of Present Illness:   The patient returns to the office for followup and review of the noninvasive studies.    The patient notes that there has been a significant deterioration in the lower extremity symptoms.  The patient notes interval shortening of their claudication distance and development of mild rest pain symptoms. No new ulcers or wounds have occurred since the last visit.   There have been no significant changes to the patient's overall health care.   The patient denies amaurosis fugax or recent TIA symptoms. There are no recent neurological changes noted. There is no history of DVT, PE or superficial thrombophlebitis. The patient denies recent episodes of angina or shortness of breath.    ABI's Rt=0.98 and Lt=0.96 (previous ABI's Rt=1.00 and Lt=1.17)  No outpatient medications have been marked as taking for the 11/01/22 encounter (Appointment) with Gilda Crease, Latina Craver, MD.    Past Medical History:  Diagnosis Date   Anxiety    COPD (chronic obstructive pulmonary disease) (HCC)    Depression    Diabetes mellitus without complication (HCC)    GERD (gastroesophageal reflux disease)    Hypercholesteremia    Hypertension    Pneumonia    PTSD (post-traumatic stress disorder)     Past Surgical History:  Procedure Laterality Date   AMPUTATION Left 03/22/2020   Procedure: AMPUTATION RAY-1st Ray Left Foot;  Surgeon: Linus Galas, [name];  Location: [facility];  Service: Podiatry;  Laterality: Left;   AMPUTATION TOE Left 03/16/2020   Procedure: AMPUTATION GREAT TOE;  Surgeon: Linus Galas, [name];  Location: [facility];  Service: Podiatry;  Laterality: Left;   AMPUTATION TOE Left 04/06/2022   Procedure: AMPUTATION TOE;  Surgeon: Linus Galas, [name];  Location: [facility];  Service: Podiatry;   Laterality: Left;   COLONOSCOPY WITH PROPOFOL N/A 12/22/2019   Procedure: COLONOSCOPY WITH PROPOFOL;  Surgeon: Midge Minium, MD;  Location: [facility];  Service: Endoscopy;  Laterality: N/A;   INCISION AND DRAINAGE ABSCESS Left 08/11/2021   Procedure: INCISION AND DRAINAGE ABSCESS;  Surgeon: Linus Salmons, MD;  Location: [facility];  Service: ENT;  Laterality: Left;   LOWER EXTREMITY ANGIOGRAPHY Left 03/18/2020   Procedure: Lower Extremity Angiography;  Surgeon: Renford Dills, MD;  Location: [facility];  Service: Cardiovascular;  Laterality: Left;   TONSILLECTOMY     VIDEO ASSISTED THORACOSCOPY (VATS)/DECORTICATION Left 08/15/2020   Procedure: VIDEO ASSISTED THORACOSCOPY (VATS)/DECORTICATION;  Surgeon: Corliss Skains, MD;  Location: [facility];  Service: Thoracic;  Laterality: Left;   WISDOM TOOTH EXTRACTION      Social History Social History   Tobacco Use   Smoking status: Every Day    Packs/day: 1.00    Years: 41.00    Additional pack years: 0.00    Total pack years: 41.00    Types: Cigarettes   Smokeless tobacco: Never   Tobacco comments:    1 pack a day reported but is trying to cut back to stop  Vaping Use  Vaping Use: Never used  Substance Use Topics   Alcohol use: Not Currently    Alcohol/week: 6.0 standard drinks of alcohol    Types: 6 Cans of beer per week    Comment: 1-2 times weekly; 4 "40s"   Drug use: Not Currently    Family History Family History  Family history unknown: Yes    Allergies  Allergen Reactions   Bee Venom Anaphylaxis   Cheese Anaphylaxis, Swelling and Other (See Comments)    Blue Cheese only   Coffea Arabica Shortness Of Breath   Coffee Flavor Shortness Of Breath     REVIEW OF SYSTEMS (Negative unless checked)  Constitutional: [] Weight loss  [] Fever  [] Chills Cardiac: [] Chest pain   [] Chest pressure   [] Palpitations   [] Shortness of breath when laying flat   [] Shortness of breath with exertion. Vascular:  [x] Pain in  legs with walking   [] Pain in legs at rest  [] History of DVT   [] Phlebitis   [] Swelling in legs   [] Varicose veins   [] Non-healing ulcers Pulmonary:   [] Uses home oxygen   [] Productive cough   [] Hemoptysis   [] Wheeze  [] COPD   [] Asthma Neurologic:  [] Dizziness   [] Seizures   [] History of stroke   [] History of TIA  [] Aphasia   [] Vissual changes   [] Weakness or numbness in arm   [] Weakness or numbness in leg Musculoskeletal:   [] Joint swelling   [] Joint pain   [] Low back pain Hematologic:  [] Easy bruising  [] Easy bleeding   [] Hypercoagulable state   [] Anemic Gastrointestinal:  [] Diarrhea   [] Vomiting  [] Gastroesophageal reflux/heartburn   [] Difficulty swallowing. Genitourinary:  [] Chronic kidney disease   [] Difficult urination  [] Frequent urination   [] Blood in urine Skin:  [] Rashes   [] Ulcers  Psychological:  [] History of anxiety   []  History of major depression.  Physical Examination  There were no vitals filed for this visit. There is no height or weight on file to calculate BMI. Gen: WD/WN, NAD Head: [place]/AT, No temporalis wasting.  Ear/Nose/Throat: Hearing grossly intact, nares w/o erythema or drainage Eyes: PER, EOMI, sclera nonicteric.  Neck: Supple, no masses.  No bruit or JVD.  Pulmonary:  Good air movement, no audible wheezing, no use of accessory muscles.  Cardiac: RRR, normal S1, S2, no Murmurs. Vascular:  mild trophic changes, no open wounds Vessel Right Left  Radial Palpable Palpable  PT Not Palpable Not Palpable  DP Not Palpable Not Palpable  Gastrointestinal: soft, non-distended. No guarding/no peritoneal signs.  Musculoskeletal: M/S 5/5 throughout.  No visible deformity.  Neurologic: CN 2-12 intact. Pain and light touch intact in extremities.  Symmetrical.  Speech is fluent. Motor exam as listed above. Psychiatric: Judgment intact, Mood & affect appropriate for pt's clinical situation. Dermatologic: No rashes or ulcers noted.  No changes consistent with  cellulitis.   CBC Lab Results  Component Value Date   WBC 5.9 01/25/2022   HGB 15.9 01/25/2022   HCT 45.9 01/25/2022   MCV 93.7 01/25/2022   PLT 269 01/25/2022    BMET    Component Value Date/Time   NA 136 01/25/2022 1921   NA 134 06/22/2020 1058   K 4.2 01/25/2022 1921   CL 100 01/25/2022 1921   CO2 26 01/25/2022 1921   GLUCOSE 300 (H) 01/25/2022 1921   BUN 13 01/25/2022 1921   BUN 8 06/22/2020 1058   CREATININE 1.01 01/25/2022 1921   CALCIUM 9.5 01/25/2022 1921   GFRNONAA >60 01/25/2022 1921   GFRAA 107 06/22/2020 1058  CrCl cannot be calculated (Patient's most recent lab result is older than the maximum 21 days allowed.).  COAG Lab Results  Component Value Date   INR 1.2 08/14/2020   INR 0.9 03/16/2020    Radiology No results found.   Assessment/Plan There are no diagnoses linked to this encounter.   Timothy Dredge, MD  11/01/2022 8:41 AM

## 2022-11-02 ENCOUNTER — Inpatient Hospital Stay: Payer: Medicaid Other

## 2022-11-02 ENCOUNTER — Other Ambulatory Visit: Payer: Self-pay

## 2022-11-02 ENCOUNTER — Inpatient Hospital Stay
Admission: EM | Admit: 2022-11-02 | Discharge: 2022-11-24 | DRG: 853 | Disposition: A | Discharge status: Home / Self Care | Payer: Medicaid Other | Attending: Internal Medicine | Admitting: Internal Medicine

## 2022-11-02 ENCOUNTER — Encounter: Payer: Self-pay | Admitting: Internal Medicine

## 2022-11-02 DIAGNOSIS — F418 Other specified anxiety disorders: Secondary | ICD-10-CM | POA: Diagnosis present

## 2022-11-02 DIAGNOSIS — E876 Hypokalemia: Secondary | ICD-10-CM | POA: Diagnosis present

## 2022-11-02 DIAGNOSIS — E871 Hypo-osmolality and hyponatremia: Secondary | ICD-10-CM | POA: Diagnosis present

## 2022-11-02 DIAGNOSIS — E782 Mixed hyperlipidemia: Secondary | ICD-10-CM | POA: Diagnosis not present

## 2022-11-02 DIAGNOSIS — E11621 Type 2 diabetes mellitus with foot ulcer: Secondary | ICD-10-CM | POA: Diagnosis present

## 2022-11-02 DIAGNOSIS — Z7901 Long term (current) use of anticoagulants: Secondary | ICD-10-CM

## 2022-11-02 DIAGNOSIS — I701 Atherosclerosis of renal artery: Secondary | ICD-10-CM | POA: Diagnosis not present

## 2022-11-02 DIAGNOSIS — Z89422 Acquired absence of other left toe(s): Secondary | ICD-10-CM

## 2022-11-02 DIAGNOSIS — E1169 Type 2 diabetes mellitus with other specified complication: Secondary | ICD-10-CM | POA: Diagnosis present

## 2022-11-02 DIAGNOSIS — Z560 Unemployment, unspecified: Secondary | ICD-10-CM

## 2022-11-02 DIAGNOSIS — Z7189 Other specified counseling: Secondary | ICD-10-CM | POA: Diagnosis not present

## 2022-11-02 DIAGNOSIS — M869 Osteomyelitis, unspecified: Secondary | ICD-10-CM | POA: Diagnosis present

## 2022-11-02 DIAGNOSIS — E10621 Type 1 diabetes mellitus with foot ulcer: Secondary | ICD-10-CM | POA: Diagnosis not present

## 2022-11-02 DIAGNOSIS — J449 Chronic obstructive pulmonary disease, unspecified: Secondary | ICD-10-CM | POA: Diagnosis present

## 2022-11-02 DIAGNOSIS — L089 Local infection of the skin and subcutaneous tissue, unspecified: Secondary | ICD-10-CM | POA: Diagnosis not present

## 2022-11-02 DIAGNOSIS — Z515 Encounter for palliative care: Secondary | ICD-10-CM | POA: Diagnosis not present

## 2022-11-02 DIAGNOSIS — E7800 Pure hypercholesterolemia, unspecified: Secondary | ICD-10-CM | POA: Diagnosis present

## 2022-11-02 DIAGNOSIS — E785 Hyperlipidemia, unspecified: Secondary | ICD-10-CM | POA: Diagnosis not present

## 2022-11-02 DIAGNOSIS — F17210 Nicotine dependence, cigarettes, uncomplicated: Secondary | ICD-10-CM | POA: Diagnosis present

## 2022-11-02 DIAGNOSIS — I5032 Chronic diastolic (congestive) heart failure: Secondary | ICD-10-CM | POA: Diagnosis present

## 2022-11-02 DIAGNOSIS — E1165 Type 2 diabetes mellitus with hyperglycemia: Secondary | ICD-10-CM | POA: Diagnosis present

## 2022-11-02 DIAGNOSIS — A419 Sepsis, unspecified organism: Secondary | ICD-10-CM | POA: Diagnosis present

## 2022-11-02 DIAGNOSIS — L03115 Cellulitis of right lower limb: Secondary | ICD-10-CM | POA: Diagnosis not present

## 2022-11-02 DIAGNOSIS — K219 Gastro-esophageal reflux disease without esophagitis: Secondary | ICD-10-CM | POA: Diagnosis present

## 2022-11-02 DIAGNOSIS — M8610 Other acute osteomyelitis, unspecified site: Secondary | ICD-10-CM | POA: Diagnosis not present

## 2022-11-02 DIAGNOSIS — E11649 Type 2 diabetes mellitus with hypoglycemia without coma: Secondary | ICD-10-CM | POA: Diagnosis not present

## 2022-11-02 DIAGNOSIS — Z79899 Other long term (current) drug therapy: Secondary | ICD-10-CM

## 2022-11-02 DIAGNOSIS — Z91018 Allergy to other foods: Secondary | ICD-10-CM

## 2022-11-02 DIAGNOSIS — Z89412 Acquired absence of left great toe: Secondary | ICD-10-CM

## 2022-11-02 DIAGNOSIS — D75838 Other thrombocytosis: Secondary | ICD-10-CM | POA: Diagnosis present

## 2022-11-02 DIAGNOSIS — M868X7 Other osteomyelitis, ankle and foot: Secondary | ICD-10-CM | POA: Diagnosis present

## 2022-11-02 DIAGNOSIS — F17200 Nicotine dependence, unspecified, uncomplicated: Secondary | ICD-10-CM | POA: Diagnosis present

## 2022-11-02 DIAGNOSIS — L97529 Non-pressure chronic ulcer of other part of left foot with unspecified severity: Secondary | ICD-10-CM | POA: Diagnosis present

## 2022-11-02 DIAGNOSIS — I739 Peripheral vascular disease, unspecified: Secondary | ICD-10-CM | POA: Diagnosis present

## 2022-11-02 DIAGNOSIS — Z794 Long term (current) use of insulin: Secondary | ICD-10-CM

## 2022-11-02 DIAGNOSIS — G9340 Encephalopathy, unspecified: Secondary | ICD-10-CM

## 2022-11-02 DIAGNOSIS — I70235 Atherosclerosis of native arteries of right leg with ulceration of other part of foot: Secondary | ICD-10-CM | POA: Diagnosis not present

## 2022-11-02 DIAGNOSIS — E1140 Type 2 diabetes mellitus with diabetic neuropathy, unspecified: Secondary | ICD-10-CM | POA: Diagnosis present

## 2022-11-02 DIAGNOSIS — F319 Bipolar disorder, unspecified: Secondary | ICD-10-CM | POA: Diagnosis present

## 2022-11-02 DIAGNOSIS — F32A Depression, unspecified: Secondary | ICD-10-CM | POA: Diagnosis present

## 2022-11-02 DIAGNOSIS — E11628 Type 2 diabetes mellitus with other skin complications: Secondary | ICD-10-CM

## 2022-11-02 DIAGNOSIS — L97519 Non-pressure chronic ulcer of other part of right foot with unspecified severity: Secondary | ICD-10-CM | POA: Diagnosis present

## 2022-11-02 DIAGNOSIS — Z01818 Encounter for other preprocedural examination: Secondary | ICD-10-CM | POA: Diagnosis not present

## 2022-11-02 DIAGNOSIS — M86171 Other acute osteomyelitis, right ankle and foot: Secondary | ICD-10-CM | POA: Diagnosis not present

## 2022-11-02 DIAGNOSIS — E1151 Type 2 diabetes mellitus with diabetic peripheral angiopathy without gangrene: Secondary | ICD-10-CM | POA: Diagnosis present

## 2022-11-02 DIAGNOSIS — Z7984 Long term (current) use of oral hypoglycemic drugs: Secondary | ICD-10-CM

## 2022-11-02 DIAGNOSIS — L8990 Pressure ulcer of unspecified site, unspecified stage: Secondary | ICD-10-CM | POA: Insufficient documentation

## 2022-11-02 DIAGNOSIS — J9611 Chronic respiratory failure with hypoxia: Secondary | ICD-10-CM | POA: Diagnosis present

## 2022-11-02 DIAGNOSIS — G9341 Metabolic encephalopathy: Secondary | ICD-10-CM | POA: Diagnosis not present

## 2022-11-02 DIAGNOSIS — Z5900 Homelessness unspecified: Secondary | ICD-10-CM | POA: Diagnosis not present

## 2022-11-02 DIAGNOSIS — I82409 Acute embolism and thrombosis of unspecified deep veins of unspecified lower extremity: Secondary | ICD-10-CM | POA: Diagnosis present

## 2022-11-02 DIAGNOSIS — E1110 Type 2 diabetes mellitus with ketoacidosis without coma: Secondary | ICD-10-CM | POA: Diagnosis present

## 2022-11-02 DIAGNOSIS — Z9981 Dependence on supplemental oxygen: Secondary | ICD-10-CM

## 2022-11-02 DIAGNOSIS — E162 Hypoglycemia, unspecified: Secondary | ICD-10-CM | POA: Diagnosis not present

## 2022-11-02 DIAGNOSIS — I10 Essential (primary) hypertension: Secondary | ICD-10-CM | POA: Diagnosis not present

## 2022-11-02 DIAGNOSIS — Z91030 Bee allergy status: Secondary | ICD-10-CM

## 2022-11-02 DIAGNOSIS — I110 Hypertensive heart disease with heart failure: Secondary | ICD-10-CM | POA: Diagnosis present

## 2022-11-02 DIAGNOSIS — Z86718 Personal history of other venous thrombosis and embolism: Secondary | ICD-10-CM

## 2022-11-02 DIAGNOSIS — Z9102 Food additives allergy status: Secondary | ICD-10-CM

## 2022-11-02 DIAGNOSIS — L89623 Pressure ulcer of left heel, stage 3: Secondary | ICD-10-CM | POA: Diagnosis not present

## 2022-11-02 DIAGNOSIS — R4182 Altered mental status, unspecified: Secondary | ICD-10-CM | POA: Diagnosis not present

## 2022-11-02 DIAGNOSIS — Z5986 Financial insecurity: Secondary | ICD-10-CM

## 2022-11-02 DIAGNOSIS — L03116 Cellulitis of left lower limb: Secondary | ICD-10-CM | POA: Diagnosis present

## 2022-11-02 DIAGNOSIS — F419 Anxiety disorder, unspecified: Secondary | ICD-10-CM | POA: Diagnosis present

## 2022-11-02 LAB — URINALYSIS, ROUTINE W REFLEX MICROSCOPIC
Bacteria, UA: NONE SEEN
Bilirubin Urine: NEGATIVE
Glucose, UA: >=500 mg/dL — AB
Ketones, ur: 5 mg/dL — AB
Leukocytes,Ua: NEGATIVE
Nitrite: NEGATIVE
Protein, ur: 30 mg/dL — AB
Specific Gravity, Urine: 1.023 (ref 1.005–1.030)
Squamous Epithelial / HPF: NONE SEEN /HPF (ref 0–5)
pH: 5 (ref 5.0–8.0)

## 2022-11-02 LAB — PROCALCITONIN: Procalcitonin: 1.64 ng/mL

## 2022-11-02 LAB — CBC WITH DIFFERENTIAL/PLATELET
Abs Immature Granulocytes: 0.14 10*3/uL — ABNORMAL HIGH (ref 0.00–0.07)
Basophils Absolute: 0.1 10*3/uL (ref 0.0–0.1)
Basophils Relative: 0 %
Eosinophils Absolute: 0.1 10*3/uL (ref 0.0–0.5)
Eosinophils Relative: 0 %
HCT: 33.5 % — ABNORMAL LOW (ref 39.0–52.0)
Hemoglobin: 10.9 g/dL — ABNORMAL LOW (ref 13.0–17.0)
Immature Granulocytes: 1 %
Lymphocytes Relative: 6 %
Lymphs Abs: 0.8 10*3/uL (ref 0.7–4.0)
MCH: 28.6 pg (ref 26.0–34.0)
MCHC: 32.5 g/dL (ref 30.0–36.0)
MCV: 87.9 fL (ref 80.0–100.0)
Monocytes Absolute: 1.5 10*3/uL — ABNORMAL HIGH (ref 0.1–1.0)
Monocytes Relative: 10 %
Neutro Abs: 12.3 10*3/uL — ABNORMAL HIGH (ref 1.7–7.7)
Neutrophils Relative %: 83 %
Platelets: 436 10*3/uL — ABNORMAL HIGH (ref 150–400)
RBC: 3.81 MIL/uL — ABNORMAL LOW (ref 4.22–5.81)
RDW: 12.8 % (ref 11.5–15.5)
WBC: 14.8 10*3/uL — ABNORMAL HIGH (ref 4.0–10.5)
nRBC: 0 % (ref 0.0–0.2)

## 2022-11-02 LAB — COMPREHENSIVE METABOLIC PANEL
ALT: 13 U/L (ref 0–44)
AST: 16 U/L (ref 15–41)
Albumin: 2.8 g/dL — ABNORMAL LOW (ref 3.5–5.0)
Alkaline Phosphatase: 163 U/L — ABNORMAL HIGH (ref 38–126)
Anion gap: 17 — ABNORMAL HIGH (ref 5–15)
BUN: 20 mg/dL (ref 6–20)
CO2: 22 mmol/L (ref 22–32)
Calcium: 8.7 mg/dL — ABNORMAL LOW (ref 8.9–10.3)
Chloride: 88 mmol/L — ABNORMAL LOW (ref 98–111)
Creatinine, Ser: 1.05 mg/dL (ref 0.61–1.24)
GFR, Estimated: >60 mL/min (ref >60)
Glucose, Bld: 437 mg/dL — ABNORMAL HIGH (ref 70–99)
Potassium: 4.3 mmol/L (ref 3.5–5.1)
Sodium: 127 mmol/L — ABNORMAL LOW (ref 135–145)
Total Bilirubin: 1 mg/dL (ref 0.3–1.2)
Total Protein: 7.7 g/dL (ref 6.5–8.1)

## 2022-11-02 LAB — PROTIME-INR
INR: 1.2 (ref 0.8–1.2)
INR: 1.3 — ABNORMAL HIGH (ref 0.8–1.2)
Prothrombin Time: 15.3 seconds — ABNORMAL HIGH (ref 11.4–15.2)
Prothrombin Time: 16.7 seconds — ABNORMAL HIGH (ref 11.4–15.2)

## 2022-11-02 LAB — CBG MONITORING, ED
Glucose-Capillary: 385 mg/dL — ABNORMAL HIGH (ref 70–99)
Glucose-Capillary: 265 mg/dL — ABNORMAL HIGH (ref 70–99)
Glucose-Capillary: 272 mg/dL — ABNORMAL HIGH (ref 70–99)

## 2022-11-02 LAB — APTT
aPTT: 116 seconds — ABNORMAL HIGH (ref 24–36)
aPTT: 149 seconds — ABNORMAL HIGH (ref 24–36)

## 2022-11-02 LAB — BLOOD GAS, VENOUS
Acid-Base Excess: 1.5 mmol/L (ref 0.0–2.0)
Bicarbonate: 25.9 mmol/L (ref 20.0–28.0)
O2 Saturation: 78.9 %
Patient temperature: 37
pCO2, Ven: 39 mmHg — ABNORMAL LOW (ref 44–60)
pH, Ven: 7.43 (ref 7.25–7.43)
pO2, Ven: 41 mmHg (ref 32–45)

## 2022-11-02 LAB — SEDIMENTATION RATE: Sed Rate: >140 mm/hr — ABNORMAL HIGH (ref 0–20)

## 2022-11-02 LAB — BETA-HYDROXYBUTYRIC ACID: Beta-Hydroxybutyric Acid: 2.55 mmol/L — ABNORMAL HIGH (ref 0.05–0.27)

## 2022-11-02 LAB — GLUCOSE, CAPILLARY: Glucose-Capillary: 326 mg/dL — ABNORMAL HIGH (ref 70–99)

## 2022-11-02 LAB — BRAIN NATRIURETIC PEPTIDE: B Natriuretic Peptide: 72.6 pg/mL (ref 0.0–100.0)

## 2022-11-02 LAB — LACTIC ACID, PLASMA
Lactic Acid, Venous: 1.3 mmol/L (ref 0.5–1.9)
Lactic Acid, Venous: 1.1 mmol/L (ref 0.5–1.9)

## 2022-11-02 MED ORDER — VENLAFAXINE HCL ER 75 MG PO CP24
225.0000 mg | ORAL_CAPSULE | Freq: Every day | ORAL | Status: DC
  Administered 2022-11-02 – 2022-11-24 (×23): 225 mg via ORAL
  Filled 2022-11-02 (×24): qty 3

## 2022-11-02 MED ORDER — VANCOMYCIN HCL 750 MG/150ML IV SOLN
750.0000 mg | Freq: Once | INTRAVENOUS | Status: AC
  Administered 2022-11-02: 750 mg via INTRAVENOUS
  Filled 2022-11-02: qty 150

## 2022-11-02 MED ORDER — ROSUVASTATIN CALCIUM 5 MG PO TABS
5.0000 mg | ORAL_TABLET | Freq: Every day | ORAL | Status: DC
  Administered 2022-11-02 – 2022-11-23 (×22): 5 mg via ORAL
  Filled 2022-11-02 (×23): qty 1

## 2022-11-02 MED ORDER — VANCOMYCIN HCL IN DEXTROSE 1-5 GM/200ML-% IV SOLN
1000.0000 mg | Freq: Once | INTRAVENOUS | Status: AC
  Administered 2022-11-02: 1000 mg via INTRAVENOUS
  Filled 2022-11-02: qty 200

## 2022-11-02 MED ORDER — BUDESON-GLYCOPYRROL-FORMOTEROL 160-9-4.8 MCG/ACT IN AERO: 2.0000 | INHALATION_SPRAY | Freq: Two times a day (BID) | RESPIRATORY_TRACT | Status: DC

## 2022-11-02 MED ORDER — HYDRALAZINE HCL 20 MG/ML IJ SOLN
5.0000 mg | INTRAMUSCULAR | Status: DC | PRN
  Filled 2022-11-02: qty 1

## 2022-11-02 MED ORDER — INSULIN GLARGINE-YFGN 100 UNIT/ML ~~LOC~~ SOLN
42.0000 [IU] | Freq: Every day | SUBCUTANEOUS | Status: DC
  Administered 2022-11-02 – 2022-11-04 (×3): 42 [IU] via SUBCUTANEOUS
  Filled 2022-11-02 (×4): qty 0.42

## 2022-11-02 MED ORDER — INSULIN ASPART 100 UNIT/ML IJ SOLN
0.0000 [IU] | Freq: Three times a day (TID) | INTRAMUSCULAR | Status: DC
  Administered 2022-11-02: 5 [IU] via SUBCUTANEOUS
  Administered 2022-11-03 (×2): 2 [IU] via SUBCUTANEOUS
  Administered 2022-11-03: 5 [IU] via SUBCUTANEOUS
  Administered 2022-11-04: 3 [IU] via SUBCUTANEOUS
  Administered 2022-11-04 (×2): 2 [IU] via SUBCUTANEOUS
  Administered 2022-11-05: 1 [IU] via SUBCUTANEOUS
  Administered 2022-11-05: 3 [IU] via SUBCUTANEOUS
  Administered 2022-11-06: 2 [IU] via SUBCUTANEOUS
  Administered 2022-11-06: 3 [IU] via SUBCUTANEOUS
  Administered 2022-11-06: 1 [IU] via SUBCUTANEOUS
  Administered 2022-11-07: 5 [IU] via SUBCUTANEOUS
  Administered 2022-11-07: 1 [IU] via SUBCUTANEOUS
  Administered 2022-11-08: 3 [IU] via SUBCUTANEOUS
  Administered 2022-11-09: 1 [IU] via SUBCUTANEOUS
  Administered 2022-11-10: 2 [IU] via SUBCUTANEOUS
  Administered 2022-11-10 – 2022-11-11 (×2): 1 [IU] via SUBCUTANEOUS
  Administered 2022-11-11 – 2022-11-13 (×5): 2 [IU] via SUBCUTANEOUS
  Administered 2022-11-13: 1 [IU] via SUBCUTANEOUS
  Administered 2022-11-13 – 2022-11-14 (×3): 2 [IU] via SUBCUTANEOUS
  Administered 2022-11-14: 1 [IU] via SUBCUTANEOUS
  Administered 2022-11-15 (×3): 2 [IU] via SUBCUTANEOUS
  Administered 2022-11-16: 1 [IU] via SUBCUTANEOUS
  Administered 2022-11-16 – 2022-11-18 (×5): 2 [IU] via SUBCUTANEOUS
  Administered 2022-11-18: 3 [IU] via SUBCUTANEOUS
  Administered 2022-11-19: 1 [IU] via SUBCUTANEOUS
  Administered 2022-11-19: 2 [IU] via SUBCUTANEOUS
  Administered 2022-11-19: 3 [IU] via SUBCUTANEOUS
  Administered 2022-11-20: 2 [IU] via SUBCUTANEOUS
  Administered 2022-11-20 (×2): 3 [IU] via SUBCUTANEOUS
  Administered 2022-11-21: 1 [IU] via SUBCUTANEOUS
  Administered 2022-11-21 – 2022-11-22 (×3): 2 [IU] via SUBCUTANEOUS
  Administered 2022-11-22: 1 [IU] via SUBCUTANEOUS
  Administered 2022-11-23: 3 [IU] via SUBCUTANEOUS
  Administered 2022-11-23: 2 [IU] via SUBCUTANEOUS
  Administered 2022-11-23: 1 [IU] via SUBCUTANEOUS
  Administered 2022-11-24: 2 [IU] via SUBCUTANEOUS
  Administered 2022-11-24: 3 [IU] via SUBCUTANEOUS
  Filled 2022-11-02 (×56): qty 1

## 2022-11-02 MED ORDER — CLONIDINE HCL 0.1 MG PO TABS
0.1000 mg | ORAL_TABLET | Freq: Every day | ORAL | Status: DC
  Administered 2022-11-02 – 2022-11-23 (×21): 0.1 mg via ORAL
  Filled 2022-11-02 (×22): qty 1

## 2022-11-02 MED ORDER — UMECLIDINIUM BROMIDE 62.5 MCG/ACT IN AEPB
1.0000 | INHALATION_SPRAY | Freq: Every day | RESPIRATORY_TRACT | Status: DC
  Administered 2022-11-03 – 2022-11-24 (×21): 1 via RESPIRATORY_TRACT
  Filled 2022-11-02 (×3): qty 7

## 2022-11-02 MED ORDER — PANTOPRAZOLE SODIUM 40 MG PO TBEC
40.0000 mg | DELAYED_RELEASE_TABLET | Freq: Every day | ORAL | Status: DC
  Administered 2022-11-02 – 2022-11-24 (×23): 40 mg via ORAL
  Filled 2022-11-02 (×24): qty 1

## 2022-11-02 MED ORDER — ONDANSETRON HCL 4 MG/2ML IJ SOLN: 4.0000 mg | Freq: Three times a day (TID) | INTRAMUSCULAR | Status: DC | PRN

## 2022-11-02 MED ORDER — METRONIDAZOLE 500 MG/100ML IV SOLN
500.0000 mg | Freq: Two times a day (BID) | INTRAVENOUS | Status: AC
  Administered 2022-11-02 – 2022-11-09 (×15): 500 mg via INTRAVENOUS
  Filled 2022-11-02 (×16): qty 100

## 2022-11-02 MED ORDER — ALBUTEROL SULFATE (2.5 MG/3ML) 0.083% IN NEBU: 3.0000 mL | INHALATION_SOLUTION | RESPIRATORY_TRACT | Status: DC | PRN

## 2022-11-02 MED ORDER — INSULIN ASPART 100 UNIT/ML IJ SOLN
0.0000 [IU] | Freq: Every day | INTRAMUSCULAR | Status: DC
  Administered 2022-11-02: 4 [IU] via SUBCUTANEOUS
  Administered 2022-11-03: 2 [IU] via SUBCUTANEOUS
  Administered 2022-11-04: 3 [IU] via SUBCUTANEOUS
  Administered 2022-11-05 – 2022-11-21 (×5): 2 [IU] via SUBCUTANEOUS
  Filled 2022-11-02 (×7): qty 1

## 2022-11-02 MED ORDER — GABAPENTIN 400 MG PO CAPS
400.0000 mg | ORAL_CAPSULE | Freq: Three times a day (TID) | ORAL | Status: DC
  Administered 2022-11-02 – 2022-11-24 (×65): 400 mg via ORAL
  Filled 2022-11-02 (×66): qty 1

## 2022-11-02 MED ORDER — BUPROPION HCL ER (SR) 150 MG PO TB12
150.0000 mg | ORAL_TABLET | Freq: Two times a day (BID) | ORAL | Status: DC
  Administered 2022-11-02 – 2022-11-09 (×16): 150 mg via ORAL
  Filled 2022-11-02 (×19): qty 1

## 2022-11-02 MED ORDER — OXYCODONE-ACETAMINOPHEN 5-325 MG PO TABS
1.0000 | ORAL_TABLET | ORAL | Status: DC | PRN
  Administered 2022-11-02 – 2022-11-24 (×74): 1 via ORAL
  Filled 2022-11-02 (×79): qty 1

## 2022-11-02 MED ORDER — SODIUM CHLORIDE 0.9 % IV SOLN: INTRAVENOUS | Status: DC

## 2022-11-02 MED ORDER — MAGNESIUM OXIDE 400 MG PO TABS
800.0000 mg | ORAL_TABLET | Freq: Two times a day (BID) | ORAL | Status: DC
  Administered 2022-11-02 – 2022-11-24 (×45): 800 mg via ORAL
  Filled 2022-11-02 (×84): qty 2

## 2022-11-02 MED ORDER — VITAMIN B-12 1000 MCG PO TABS
1000.0000 ug | ORAL_TABLET | Freq: Every day | ORAL | Status: DC
  Administered 2022-11-02 – 2022-11-24 (×23): 1000 ug via ORAL
  Filled 2022-11-02 (×15): qty 1
  Filled 2022-11-02: qty 2
  Filled 2022-11-02 (×9): qty 1

## 2022-11-02 MED ORDER — VENLAFAXINE HCL ER 75 MG PO CP24: 75.0000 mg | ORAL_CAPSULE | Freq: Every day | ORAL | Status: DC

## 2022-11-02 MED ORDER — SODIUM CHLORIDE 0.9 % IV SOLN
2.0000 g | INTRAVENOUS | Status: AC
  Administered 2022-11-02 – 2022-11-09 (×8): 2 g via INTRAVENOUS
  Filled 2022-11-02 (×8): qty 20

## 2022-11-02 MED ORDER — ACETAMINOPHEN 325 MG PO TABS: 650.0000 mg | ORAL_TABLET | Freq: Four times a day (QID) | ORAL | Status: DC | PRN

## 2022-11-02 MED ORDER — INSULIN ASPART 100 UNIT/ML IJ SOLN
8.0000 [IU] | Freq: Once | INTRAMUSCULAR | Status: AC
  Administered 2022-11-02: 8 [IU] via INTRAVENOUS
  Filled 2022-11-02: qty 1

## 2022-11-02 MED ORDER — VANCOMYCIN HCL IN DEXTROSE 1-5 GM/200ML-% IV SOLN
1000.0000 mg | Freq: Two times a day (BID) | INTRAVENOUS | Status: AC
  Administered 2022-11-03 – 2022-11-09 (×13): 1000 mg via INTRAVENOUS
  Filled 2022-11-02 (×15): qty 200

## 2022-11-02 MED ORDER — HEPARIN (PORCINE) 25000 UT/250ML-% IV SOLN
2350.0000 [IU]/h | INTRAVENOUS | Status: DC
  Administered 2022-11-02: 2600 [IU]/h via INTRAVENOUS
  Administered 2022-11-03: 2400 [IU]/h via INTRAVENOUS
  Administered 2022-11-03: 2350 [IU]/h via INTRAVENOUS
  Administered 2022-11-03: 2600 [IU]/h via INTRAVENOUS
  Administered 2022-11-04: 2350 [IU]/h via INTRAVENOUS
  Filled 2022-11-02 (×4): qty 250

## 2022-11-02 MED ORDER — HEPARIN (PORCINE) 25000 UT/250ML-% IV SOLN
3000.0000 [IU]/h | INTRAVENOUS | Status: DC
  Administered 2022-11-02: 3000 [IU]/h via INTRAVENOUS
  Filled 2022-11-02: qty 250

## 2022-11-02 MED ORDER — DM-GUAIFENESIN ER 30-600 MG PO TB12: 1.0000 | ORAL_TABLET | Freq: Two times a day (BID) | ORAL | Status: DC | PRN

## 2022-11-02 MED ORDER — SODIUM CHLORIDE 0.9 % IV BOLUS
1000.0000 mL | Freq: Once | INTRAVENOUS | Status: AC
  Administered 2022-11-02: 1000 mL via INTRAVENOUS

## 2022-11-02 MED ORDER — MOMETASONE FURO-FORMOTEROL FUM 100-5 MCG/ACT IN AERO
2.0000 | INHALATION_SPRAY | Freq: Two times a day (BID) | RESPIRATORY_TRACT | Status: DC
  Administered 2022-11-03 – 2022-11-24 (×42): 2 via RESPIRATORY_TRACT
  Filled 2022-11-02 (×2): qty 8.8

## 2022-11-02 MED ORDER — ARIPIPRAZOLE 2 MG PO TABS
10.0000 mg | ORAL_TABLET | Freq: Every day | ORAL | Status: DC
  Administered 2022-11-02 – 2022-11-24 (×23): 10 mg via ORAL
  Filled 2022-11-02 (×2): qty 5
  Filled 2022-11-02 (×2): qty 1
  Filled 2022-11-02 (×5): qty 5
  Filled 2022-11-02 (×2): qty 1
  Filled 2022-11-02: qty 5
  Filled 2022-11-02: qty 1
  Filled 2022-11-02 (×3): qty 5
  Filled 2022-11-02 (×2): qty 1
  Filled 2022-11-02 (×3): qty 5
  Filled 2022-11-02 (×3): qty 1

## 2022-11-02 NOTE — Progress Notes (Signed)
Pharmacy Antibiotic Note  Timothy [name]. is a 62 y.o. male w/ PMH of HTN, hLD, DM, COPD, CHF, depression with anxiety, PTSD, bipolar, DVT on Eliquis admitted on 11/02/2022 with cellulitis.  Pharmacy has been consulted for vancomycin dosing.  Plan: start vancomycin 750 mg IV x 1 to complete loading dose (received 1000 mg IV vancomycin) then 1000 mg IV Q 12 hrs.  Goal AUC 400-550. Expected AUC: 453.5 SCr used: 1.05 mg/dL   Height: 5\' 10"  (177.8 cm) Weight: 89.4 kg (197 lb 1.5 oz) IBW/kg (Calculated) : 73  Temp (24hrs), Avg:98.8 F (37.1 C), Min:98.8 F (37.1 C), Max:98.8 F (37.1 C)  Recent Labs  Lab 11/02/22 1121  WBC 14.8*  CREATININE 1.05  LATICACIDVEN 1.3    Estimated Creatinine Clearance: 84.2 mL/min (by C-G formula based on SCr of 1.05 mg/dL).    Allergies  Allergen Reactions   Bee Venom Anaphylaxis   Cheese Anaphylaxis, Swelling and Other (See Comments)    Blue Cheese only   Coffea Arabica Shortness Of Breath   Coffee Flavor Shortness Of Breath    Antimicrobials this admission: 05/17 vancomycin >>  05/17 metronidazole >> 05/17 ceftriaxone  >>   Microbiology results: 05/17 BCx: pending  Thank you for allowing pharmacy to be a part of this patient's care.  Lowella Bandy 11/02/2022 1:05 PM

## 2022-11-02 NOTE — ED Provider Notes (Signed)
[facility] Provider Note    Event Date/Time   First MD Initiated Contact with Patient 11/02/22 1136     (approximate)  History   Chief Complaint: Wound Infection  HPI  Timothy Luhrs. is a 62 y.o. male with a past medical history anxiety, COPD, diabetes, hypertension, hyperlipidemia presents to the emergency department for worsening infection of his bilateral feet.  According to the patient for the past 2 months or so he has had ulcerations to the base of the right heel and foot and over the past 3 weeks has developed an ulceration to the bottom of the left foot.  Patient has been followed by Dr. Alberteen Spindle of podiatry.  Patient was seen today and referred to the emergency department for worsening infection for further workup and admission for IV antibiotics.  Patient denies any fever, reassuring vitals in the emergency department.  Does state pain in bilateral feet.  Physical Exam   Triage Vital Signs: ED Triage Vitals [11/02/22 1103]  Enc Vitals Group     BP 99/67     Pulse Rate 99     Resp 18     Temp 98.8 F (37.1 C)     Temp Source Oral     SpO2 98 %     Weight 197 lb 1.5 oz (89.4 kg)     Height 5\' 10"  (1.778 m)     Head Circumference      Peak Flow      Pain Score 8     Pain Loc      Pain Edu?      Excl. in GC?     Most recent vital signs: Vitals:   11/02/22 1103  BP: 99/67  Pulse: 99  Resp: 18  Temp: 98.8 F (37.1 C)  SpO2: 98%    General: Awake, no distress.  CV:  Good peripheral perfusion.  Regular rate and rhythm  Resp:  Normal effort.  Equal breath sounds bilaterally.  Abd:  No distention.  Soft, nontender.  No rebound or guarding. Other:  Patient has fairly significant ulcerations to the base of the right heel and foot currently packed with gauze. Smaller appearing ulceration to the distal aspect of the plantar surface of the left foot.  ED Results / Procedures / Treatments   MEDICATIONS ORDERED IN ED: Medications   vancomycin (VANCOCIN) IVPB 1000 mg/200 mL premix (has no administration in time range)     IMPRESSION / MDM / ASSESSMENT AND PLAN / ED COURSE  I reviewed the triage vital signs and the nursing notes.  Patient's presentation is most consistent with acute presentation with potential threat to life or bodily function.  Patient presents emergency department for bilateral foot ulcerations/wounds, right greater than left.  Patient states increased pain.  Was seen by podiatry today referred to the emergency department for admission for IV antibiotics possible debridement.  Patient's CBC shows moderate leukocytosis of 14,000 otherwise reassuring.  Chemistry shows hyperglycemia 430 with some mild pseudohyponatremia and an elevated anion gap.  Do not believe the patient is necessarily in DKA.  We will obtain a VBG we will IV hydrate we will dose 8 units of IV insulin and monitor.  Overall patient appears well, no distress.  Will start the patient on IV vancomycin, send blood cultures while awaiting admission.  I spoke with Dr. Alberteen Spindle, he agrees with plan to admit IV antibiotics.  Would like additional imaging of the foot that the hospitalist has ordered.  Patient  admitted to the hospital service.  FINAL CLINICAL IMPRESSION(S) / ED DIAGNOSES   Cellulitis   Note:  This document was prepared using Dragon voice recognition software and may include unintentional dictation errors.   Minna Antis, MD 11/02/22 [phone]

## 2022-11-02 NOTE — Consult Note (Signed)
ORTHOPAEDIC CONSULTATION  REQUESTING PHYSICIAN: Lorretta Harp, MD  Chief Complaint: Bilateral foot ulcerations  HPI: Timothy [name]. is a 62 y.o. male who complains of worsening progressive bilateral foot ulcerations.  Seen today in the outpatient clinic by Dr. Alberteen Spindle.  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  Developed a worsening ulceration on the lateral aspect of his right heel.  Necrotic tissue and foul odor was noted from the area.  Concern for possible osteomyelitis.  Patient states he has had poor appetite with some chills of recent.  Admitted to the ER for workup of infection.  Elevated white blood cell count upon admission.  X-rays and MRI have been performed.  MRI has been resulted concerning for osteomyelitis of the heel.  Past Medical History:  Diagnosis Date   Anxiety    COPD (chronic obstructive pulmonary disease) (HCC)    Depression    Diabetes mellitus without complication (HCC)    GERD (gastroesophageal reflux disease)    Hypercholesteremia    Hypertension    Pneumonia    PTSD (post-traumatic stress disorder)    Past Surgical History:  Procedure Laterality Date   AMPUTATION Left 03/22/2020   Procedure: AMPUTATION RAY-1st Ray Left Foot;  Surgeon: Linus Galas, [name];  Location: [facility];  Service: Podiatry;  Laterality: Left;   AMPUTATION TOE Left 03/16/2020   Procedure: AMPUTATION GREAT TOE;  Surgeon: Linus Galas, [name];  Location: [facility];  Service: Podiatry;  Laterality: Left;   AMPUTATION TOE Left 04/06/2022   Procedure: AMPUTATION TOE;  Surgeon: Linus Galas, [name];  Location: [facility];  Service: Podiatry;  Laterality: Left;   COLONOSCOPY WITH PROPOFOL N/A 12/22/2019   Procedure: COLONOSCOPY WITH PROPOFOL;  Surgeon: Midge Minium, MD;  Location: [facility];  Service: Endoscopy;  Laterality: N/A;   INCISION AND DRAINAGE ABSCESS Left 08/11/2021   Procedure: INCISION AND DRAINAGE ABSCESS;  Surgeon: Linus Salmons, MD;  Location: [facility];  Service: ENT;  Laterality: Left;   LOWER EXTREMITY ANGIOGRAPHY Left 03/18/2020   Procedure: Lower Extremity Angiography;  Surgeon: Renford Dills, MD;  Location: [facility] INVASIVE [facility] LAB;  Service: Cardiovascular;  Laterality: Left;   TONSILLECTOMY     VIDEO ASSISTED THORACOSCOPY (VATS)/DECORTICATION Left 08/15/2020   Procedure: VIDEO ASSISTED THORACOSCOPY (VATS)/DECORTICATION;  Surgeon: Corliss Skains, MD;  Location: [facility] OR;  Service: Thoracic;  Laterality: Left;   WISDOM TOOTH EXTRACTION     Social History   Socioeconomic History   Marital status: Single    Spouse name: Not on file   Number of children: 0   Years of education: Not on file   Highest education level: Not on file  Occupational History   Occupation: Unemployed  Tobacco Use   Smoking status: Every Day    Packs/day: 1.00    Years: 41.00    Additional pack years: 0.00    Total pack years: 41.00    Types: Cigarettes   Smokeless tobacco: Never   Tobacco comments:    1 pack a day reported but is trying to cut back to stop  Vaping Use   Vaping Use: Never used  Substance and Sexual Activity   Alcohol use: Not Currently    Alcohol/week: 6.0 standard drinks of alcohol    Types: 6 Cans of beer per week    Comment: 1-2 times weekly; 4 "40s"   Drug use: Not Currently   Sexual activity: Not Currently  Other Topics Concern  Not on file  Social History Narrative   Lives with 1 roommate   Social Determinants of Health   Financial Resource Strain: Medium Risk (05/28/2022)   Overall Financial Resource Strain (CARDIA)    Difficulty of Paying Living Expenses: Somewhat hard  Food Insecurity: No Food Insecurity (02/10/2020)   Hunger Vital Sign    Worried About Running Out of Food in the Last Year: Never true    Ran Out of Food in the Last Year: Never true  Transportation Needs: No Transportation Needs (02/10/2020)   PRAPARE - Administrator, Civil Service (Medical): No     Lack of Transportation (Non-Medical): No  Physical Activity: Inactive (05/28/2022)   Exercise Vital Sign    Days of Exercise per Week: 0 days    Minutes of Exercise per Session: 0 min  Stress: Stress Concern Present (05/28/2022)   [employer] of Occupational Health - Occupational Stress Questionnaire    Feeling of Stress : Very much  Social Connections: Socially Isolated (05/28/2022)   Social Connection and Isolation Panel [NHANES]    Frequency of Communication with Friends and Family: Twice a week    Frequency of Social Gatherings with Friends and Family: Once a week    Attends Religious Services: Never    Database administrator or Organizations: No    Attends Banker Meetings: Never    Marital Status: Widowed   Family History  Family history unknown: Yes   Allergies  Allergen Reactions   Bee Venom Anaphylaxis   Cheese Anaphylaxis, Swelling and Other (See Comments)    Blue Cheese only   Coffea Arabica Shortness Of Breath   Coffee Flavor Shortness Of Breath   Prior to Admission medications   Medication Sig Start Date End Date Taking? Authorizing Provider  acetaminophen (TYLENOL) 650 MG CR tablet Take 1,300 mg by mouth every 8 (eight) hours as needed for pain.   Yes [provider]  albuterol (VENTOLIN HFA) 108 (90 Base) MCG/ACT inhaler INHALE 1 PUFF INTO THE LUNGS ONCE EVERY 6 HOURS AS NEEDED FOR SHORTNESS OF BREATH OR WHEEZING. Patient taking differently: Inhale 1 puff into the lungs every 6 (six) hours as needed for wheezing or shortness of breath. 01/16/21  Yes   amLODipine (NORVASC) 5 MG tablet Take 1 tablet (5 mg total) by mouth once daily. 01/16/21  Yes   apixaban (ELIQUIS) 5 MG TABS tablet TAKE ONE TABLET (5MG  TOTAL) BY MOUTH TWICE DAILY. Patient taking differently: Take 5 mg by mouth 2 (two) times daily. 01/16/21  Yes   ARIPiprazole (ABILIFY) 10 MG tablet Take 1 tablet (10 mg total) by mouth daily. 08/10/22 11/08/22 Yes Nwoko, Tommas Olp, PA   Budeson-Glycopyrrol-Formoterol (BREZTRI AEROSPHERE) 160-9-4.8 MCG/ACT AERO Inhale 2 puffs into the lungs in the morning and at bedtime. 08/09/22  Yes Salena Saner, MD  buPROPion [facility] SR) 150 MG 12 hr tablet Take 150 mg by mouth 2 (two) times daily.   Yes [provider]  cloNIDine (CATAPRES) 0.1 MG tablet Take 1 tablet (0.1 mg total) by mouth at bedtime. 08/10/22 11/08/22 Yes Nwoko, Tommas Olp, PA  cyanocobalamin (VITAMIN B12) 1000 MCG tablet Take 1,000 mcg by mouth daily.   Yes [provider]  doxycycline (VIBRAMYCIN) 100 MG capsule Take 100 mg by mouth 2 (two) times daily. 10/13/22  Yes [provider]  gabapentin (NEURONTIN) 400 MG capsule TAKE ONE CAPSULE (400MG  TOTAL) BY MOUTH THREE TIMES DAILY. Patient taking differently: Take 400 mg by mouth 3 (three)  times daily. 01/16/21  Yes   gentamicin ointment (GARAMYCIN) 0.1 % Apply topically in the morning and at bedtime. To left side of face. 08/12/21  Yes Esaw Grandchild A, DO  glipiZIDE (GLUCOTROL) 10 MG tablet TAKE ONE TABLET (10MG  TOTAL) BY MOUTH TWICE DAILY. Patient taking differently: Take 10 mg by mouth 2 (two) times daily before a meal. 02/16/21  Yes Iloabachie, Chioma E, NP  insulin glargine (LANTUS) 100 UNIT/ML Solostar Pen Inject 20 Units into the skin at bedtime. Patient taking differently: Inject 42 Units into the skin daily. In AM 08/14/21  Yes Esaw Grandchild A, DO  magnesium oxide (MAG-OX) 400 MG tablet Take 800 mg by mouth 2 (two) times daily.   Yes [provider]  metFORMIN (GLUCOPHAGE) 1000 MG tablet TAKE ONE TABLET (1,000MG  TOTAL) BY MOUTH TWICE DAILY. Patient taking differently: Take 1,000 mg by mouth 2 (two) times daily with a meal. 01/16/21  Yes   metoprolol succinate (TOPROL-XL) 50 MG 24 hr tablet Take 50 mg by mouth daily. 07/21/21  Yes [provider]  pantoprazole (PROTONIX) 40 MG tablet TAKE ONE TABLET (40MG  TOTAL) BY MOUTH ONCE DAILY. Patient taking differently: Take 40 mg by  mouth daily. 01/16/21  Yes   rosuvastatin (CRESTOR) 5 MG tablet TAKE ONE TABLET (5MG  TOTAL) BY MOUTH ONCE DAILY. Patient taking differently: Take 5 mg by mouth daily. 01/16/21  Yes   venlafaxine XR (EFFEXOR XR) 150 MG 24 hr capsule Take 1 capsule (150 mg total) by mouth daily with breakfast. Patient to take medication with 75 mg tablet for (225 mg total). 08/10/22 11/08/22 Yes Nwoko, Tommas Olp, PA  venlafaxine XR (EFFEXOR-XR) 75 MG 24 hr capsule Take 1 capsule (75 mg total) by mouth daily. Patient to take medication with 150 mg tablet for (225 mg total). 08/10/22 08/10/23 Yes Nwoko, Uchenna E, PA  blood glucose meter kit and supplies KIT Dispense based on patient and insurance preference. Use up to four times daily as directed. (FOR ICD-9 250.00, 250.01). 10/15/19   Iloabachie, Chioma E, NP  blood glucose meter kit and supplies KIT Dispense based on patient and insurance preference. Use up to four times daily as directed. 08/12/21   Pennie Banter, DO  Dextromethorphan-guaiFENesin (MUCINEX DM) 30-600 MG TB12 Take 1 tablet by mouth 2 (two) times daily as needed (cough). Patient not taking: Reported on 08/10/2022 06/27/22   Glenford Bayley, NP  Insulin Pen Needle (PEN NEEDLES) 32G X 6 MM MISC 1 each by Does not apply route daily. 08/12/21   Pennie Banter, DO  citalopram (CELEXA) 20 MG tablet Take 1 tablet (20 mg total) by mouth daily. Patient taking differently: Take 20 mg by mouth at bedtime. 11/09/20 01/17/21  Rolm Gala, NP   MR ANKLE RIGHT WO CONTRAST  Result Date: 11/02/2022 CLINICAL DATA:  Osteomyelitis suspected. Right foot wound and cellulitis. EXAM: MRI OF THE RIGHT ANKLE WITHOUT CONTRAST TECHNIQUE: Multiplanar, multisequence MR imaging of the ankle was performed. No intravenous contrast was administered. COMPARISON:  None Available. FINDINGS: TENDONS Peroneal: Peroneal longus tendon intact. Peroneal brevis intact. Fluid tracking along the peroneus brevis and peroneus longus tendon  suggesting tenosynovitis. Posteromedial: Posterior tibial tendon intact. Flexor hallucis longus tendon intact. Flexor digitorum longus tendon intact. Anterior: Tibialis anterior tendon intact. Extensor hallucis longus tendon intact Extensor digitorum longus tendon intact. Achilles: Mild edema along the distal Achilles tendon suggesting peritenonitis. Achilles tendon is however intact. Plantar Fascia: Intact. LIGAMENTS Lateral: Anterior talofibular ligament intact. Calcaneofibular ligament intact. Posterior talofibular ligament  intact. Anterior and posterior tibiofibular ligaments intact. Medial: Deltoid ligament intact. Spring ligament intact. CARTILAGE Ankle Joint: No joint effusion. Normal ankle mortise. No chondral defect. Subtalar Joints/Sinus Tarsi: Normal subtalar joints. No subtalar joint effusion. Normal sinus tarsi. Evaluation of ankle and subtalar joints is limited due to motion. Bones: Bone marrow edema of the lateral aspect of the calcaneus consistent with osteomyelitis. No evidence of fracture or dislocation. Soft Tissue: Large deep skin wound about the lateral aspect of the calcaneus measuring at least 3.5 x 1.0 x 2.4 cm. Marked soft tissue swelling about the medial and lateral aspect of the ankle. IMPRESSION: 1. Large deep skin wound about the lateral aspect of the calcaneus measuring at least 3.5 x 1.0 x 2.4 cm. 2. Bone marrow edema of the lateral aspect of the calcaneus adjacent to the above-mentioned deep skin wound consistent with osteomyelitis. 3. Fluid tracking along the peroneus brevis and peroneus longus tendon suggesting tenosynovitis. 4. Mild edema along the distal Achilles tendon suggesting peritenonitis, likely secondary to adjacent infectious/inflammatory process. 5. Evaluation of ankle and subtalar joints is limited due to motion. Electronically Signed   By: Larose Hires D.O.   On: 11/02/2022 14:38   DG Foot 2 Views Left  Result Date: 11/02/2022 CLINICAL DATA:  Diabetic foot  infection.  Bilateral feet wounds. EXAM: LEFT FOOT - 2 VIEW; RIGHT FOOT - 2 VIEW COMPARISON:  None Available. FINDINGS: Right foot: There is no evidence of fracture or dislocation. No cortical erosion or periosteal reaction. Osseous deformity of the second metatarsal head, which may be sequela of prior trauma or infectious/inflammatory process. Soft tissue swelling about the heel and lateral aspect of the ankle. Left foot: Postsurgical changes for prior amputation through the distal first metatarsal and second metatarsophalangeal joint. No cortical erosion or periosteal reaction. Soft tissue swelling about the prior amputation site. IMPRESSION: RIGHT FOOT: No cortical erosion or periosteal reaction to suggest osteomyelitis. Soft tissue swelling about the heel and lateral aspect of the ankle. LEFT FOOT: Postsurgical changes for prior amputation through the distal first metatarsal and second metatarsophalangeal joint without evidence of osteomyelitis. Electronically Signed   By: Larose Hires D.O.   On: 11/02/2022 13:26   DG Foot 2 Views Right  Result Date: 11/02/2022 CLINICAL DATA:  Diabetic foot infection.  Bilateral feet wounds. EXAM: LEFT FOOT - 2 VIEW; RIGHT FOOT - 2 VIEW COMPARISON:  None Available. FINDINGS: Right foot: There is no evidence of fracture or dislocation. No cortical erosion or periosteal reaction. Osseous deformity of the second metatarsal head, which may be sequela of prior trauma or infectious/inflammatory process. Soft tissue swelling about the heel and lateral aspect of the ankle. Left foot: Postsurgical changes for prior amputation through the distal first metatarsal and second metatarsophalangeal joint. No cortical erosion or periosteal reaction. Soft tissue swelling about the prior amputation site. IMPRESSION: RIGHT FOOT: No cortical erosion or periosteal reaction to suggest osteomyelitis. Soft tissue swelling about the heel and lateral aspect of the ankle. LEFT FOOT: Postsurgical  changes for prior amputation through the distal first metatarsal and second metatarsophalangeal joint without evidence of osteomyelitis. Electronically Signed   By: Larose Hires D.O.   On: 11/02/2022 13:26    Positive ROS: All other systems have been reviewed and were otherwise negative with the exception of those mentioned in the HPI and as above.  12 point ROS was performed.  Physical Exam: General: Alert and oriented.  No apparent distress.  Vascular:  Left foot:Dorsalis Pedis:  thready Posterior Tibial:  absent  Right foot: Dorsalis Pedis:  absent Posterior Tibial:  absent  Neuro:absent protective sensation   Derm: Plantar left foot with 1-1/2 x 1 cm full-thickness ulceration plantar to the second third MTPJ region.  No signs of infection.  No deep extension into the capsular region.  Right lateral heel has a 9 x 8 full-thickness ulceration with a large area of necrotic tissue laterally.  Undermining of the skin with direct probing of bone at this time.  Foul odor from this wound is noted.  Ortho/MS: He is status post left first ray amputation and second toe amputation.  I personally reviewed the MRI report as well as the MRI images.  There is noted a large ulceration to the lateral aspect of the calcaneus with extension down to the calcaneus with enhancement within the bone consistent for osteomyelitis.  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  I discussed the chances of limb salvage with this are very minimal.  This would take extensive debridement of bone and an extensive amount of time for attempts at wound healing with a low likelihood of healing.  My recommendation is a below-knee amputation at this time.  I had a long discussion in regards to this and the patient seemed  understand the gravity of the situation.  Will defer amputation to vascular surgery at this time.  Left foot with stable ulceration.  Will need close wound care and monitoring in the outpatient clinic.  While in-house can apply a silicone border foam pad to the plantar aspect of the left foot and change this daily.  Continue to watch for signs and symptoms of infection.  Right now there is no signs of infection currently.    Irean Hong, [name] Cell [phone]   11/02/2022 5:21 PM

## 2022-11-02 NOTE — H&P (View-Only) (Signed)
Hospital Consult    Reason for Consult:  Bilateral Lower Extremity Non Healing Wounds Requesting Physician:  Dr Xilin Niu MD MRN #:  [id number]  History of Present Illness: This is a 62 y.o. male w/ PMH of HTN, hLD, DM, COPD, CHF, depression with anxiety, PTSD, bipolar, DVT on Eliquis admitted on 11/02/2022 with cellulitis and open sores to bilateral lower extremities. This according to the patient all started about 2 months ago. Patient has been seen prior in 03/2020 by Dr Greg Schnier MD for LLE Angiogram. Patient also endorses multiple toe amputations by Dr Cline in 09/21 and 10/21.   On exam the patient endorses pain to his right lower extremity. There is a malodorous smell from this open wound. Both his lower extremities are warm to the touch from Knee down. Patient reports having a stent placed in his left thigh years ago. No other complaints and vitals all remain stable.   Past Medical History:  Diagnosis Date   Anxiety    COPD (chronic obstructive pulmonary disease) (HCC)    Depression    Diabetes mellitus without complication (HCC)    GERD (gastroesophageal reflux disease)    Hypercholesteremia    Hypertension    Pneumonia    PTSD (post-traumatic stress disorder)     Past Surgical History:  Procedure Laterality Date   AMPUTATION Left 03/22/2020   Procedure: AMPUTATION RAY-1st Ray Left Foot;  Surgeon: Cline, Todd, [name];  Location: [facility];  Service: Podiatry;  Laterality: Left;   AMPUTATION TOE Left 03/16/2020   Procedure: AMPUTATION GREAT TOE;  Surgeon: Cline, Todd, [name];  Location: [facility];  Service: Podiatry;  Laterality: Left;   AMPUTATION TOE Left 04/06/2022   Procedure: AMPUTATION TOE;  Surgeon: Cline, Todd, [name];  Location: [facility];  Service: Podiatry;  Laterality: Left;   COLONOSCOPY WITH PROPOFOL N/A 12/22/2019   Procedure: COLONOSCOPY WITH PROPOFOL;  Surgeon: Wohl, Darren, MD;  Location: [facility];  Service: Endoscopy;  Laterality: N/A;   INCISION AND DRAINAGE  ABSCESS Left 08/11/2021   Procedure: INCISION AND DRAINAGE ABSCESS;  Surgeon: McQueen, Chapman, MD;  Location: [facility];  Service: ENT;  Laterality: Left;   LOWER EXTREMITY ANGIOGRAPHY Left 03/18/2020   Procedure: Lower Extremity Angiography;  Surgeon: Schnier, Gregory G, MD;  Location: [facility];  Service: Cardiovascular;  Laterality: Left;   TONSILLECTOMY     VIDEO ASSISTED THORACOSCOPY (VATS)/DECORTICATION Left 08/15/2020   Procedure: VIDEO ASSISTED THORACOSCOPY (VATS)/DECORTICATION;  Surgeon: Lightfoot, Harrell O, MD;  Location: [facility];  Service: Thoracic;  Laterality: Left;   WISDOM TOOTH EXTRACTION      Allergies  Allergen Reactions   Bee Venom Anaphylaxis   Cheese Anaphylaxis, Swelling and Other (See Comments)    Blue Cheese only   Coffea Arabica Shortness Of Breath   Coffee Flavor Shortness Of Breath    Prior to Admission medications   Medication Sig Start Date End Date Taking? Authorizing Provider  acetaminophen (TYLENOL) 650 MG CR tablet Take 1,300 mg by mouth every 8 (eight) hours as needed for pain.   Yes [provider]  albuterol (VENTOLIN HFA) 108 (90 Base) MCG/ACT inhaler INHALE 1 PUFF INTO THE LUNGS ONCE EVERY 6 HOURS AS NEEDED FOR SHORTNESS OF BREATH OR WHEEZING. Patient taking differently: Inhale 1 puff into the lungs every 6 (six) hours as needed for wheezing or shortness of breath. 01/16/21  Yes   amLODipine (NORVASC) 5 MG tablet Take 1 tablet (5 mg total) by mouth once daily. 01/16/21  Yes   apixaban (  ELIQUIS) 5 MG TABS tablet TAKE ONE TABLET (5MG TOTAL) BY MOUTH TWICE DAILY. Patient taking differently: Take 5 mg by mouth 2 (two) times daily. 01/16/21  Yes   ARIPiprazole (ABILIFY) 10 MG tablet Take 1 tablet (10 mg total) by mouth daily. 08/10/22 11/08/22 Yes Nwoko, Uchenna E, PA  Budeson-Glycopyrrol-Formoterol (BREZTRI AEROSPHERE) 160-9-4.8 MCG/ACT AERO Inhale 2 puffs into the lungs in the morning and at bedtime. 08/09/22  Yes Gonzalez, Carmen L, MD   buPROPion (WELLBUTRIN SR) 150 MG 12 hr tablet Take 150 mg by mouth 2 (two) times daily.   Yes [provider]  cloNIDine (CATAPRES) 0.1 MG tablet Take 1 tablet (0.1 mg total) by mouth at bedtime. 08/10/22 11/08/22 Yes Nwoko, Uchenna E, PA  cyanocobalamin (VITAMIN B12) 1000 MCG tablet Take 1,000 mcg by mouth daily.   Yes [provider]  doxycycline (VIBRAMYCIN) 100 MG capsule Take 100 mg by mouth 2 (two) times daily. 10/13/22  Yes [provider]  gabapentin (NEURONTIN) 400 MG capsule TAKE ONE CAPSULE (400MG TOTAL) BY MOUTH THREE TIMES DAILY. Patient taking differently: Take 400 mg by mouth 3 (three) times daily. 01/16/21  Yes   gentamicin ointment (GARAMYCIN) 0.1 % Apply topically in the morning and at bedtime. To left side of face. 08/12/21  Yes Griffith, Kelly A, DO  glipiZIDE (GLUCOTROL) 10 MG tablet TAKE ONE TABLET (10MG TOTAL) BY MOUTH TWICE DAILY. Patient taking differently: Take 10 mg by mouth 2 (two) times daily before a meal. 02/16/21  Yes Iloabachie, Chioma E, NP  insulin glargine (LANTUS) 100 UNIT/ML Solostar Pen Inject 20 Units into the skin at bedtime. Patient taking differently: Inject 42 Units into the skin daily. In AM 08/14/21  Yes Griffith, Kelly A, DO  magnesium oxide (MAG-OX) 400 MG tablet Take 800 mg by mouth 2 (two) times daily.   Yes [provider]  metFORMIN (GLUCOPHAGE) 1000 MG tablet TAKE ONE TABLET (1,000MG TOTAL) BY MOUTH TWICE DAILY. Patient taking differently: Take 1,000 mg by mouth 2 (two) times daily with a meal. 01/16/21  Yes   metoprolol succinate (TOPROL-XL) 50 MG 24 hr tablet Take 50 mg by mouth daily. 07/21/21  Yes [provider]  pantoprazole (PROTONIX) 40 MG tablet TAKE ONE TABLET (40MG TOTAL) BY MOUTH ONCE DAILY. Patient taking differently: Take 40 mg by mouth daily. 01/16/21  Yes   rosuvastatin (CRESTOR) 5 MG tablet TAKE ONE TABLET (5MG TOTAL) BY MOUTH ONCE DAILY. Patient taking differently: Take 5 mg by mouth daily. 01/16/21   Yes   venlafaxine XR (EFFEXOR XR) 150 MG 24 hr capsule Take 1 capsule (150 mg total) by mouth daily with breakfast. Patient to take medication with 75 mg tablet for (225 mg total). 08/10/22 11/08/22 Yes Nwoko, Uchenna E, PA  venlafaxine XR (EFFEXOR-XR) 75 MG 24 hr capsule Take 1 capsule (75 mg total) by mouth daily. Patient to take medication with 150 mg tablet for (225 mg total). 08/10/22 08/10/23 Yes Nwoko, Uchenna E, PA  blood glucose meter kit and supplies KIT Dispense based on patient and insurance preference. Use up to four times daily as directed. (FOR ICD-9 250.00, 250.01). 10/15/19   Iloabachie, Chioma E, NP  blood glucose meter kit and supplies KIT Dispense based on patient and insurance preference. Use up to four times daily as directed. 08/12/21   Griffith, Kelly A, DO  Dextromethorphan-guaiFENesin (MUCINEX DM) 30-600 MG TB12 Take 1 tablet by mouth 2 (two) times daily as needed (cough). Patient not taking: Reported on 08/10/2022 06/27/22   Walsh,   Elizabeth W, NP  Insulin Pen Needle (PEN NEEDLES) 32G X 6 MM MISC 1 each by Does not apply route daily. 08/12/21   Griffith, Kelly A, DO  citalopram (CELEXA) 20 MG tablet Take 1 tablet (20 mg total) by mouth daily. Patient taking differently: Take 20 mg by mouth at bedtime. 11/09/20 01/17/21  Iloabachie, Chioma E, NP    Social History   Socioeconomic History   Marital status: Single    Spouse name: Not on file   Number of children: 0   Years of education: Not on file   Highest education level: Not on file  Occupational History   Occupation: Unemployed  Tobacco Use   Smoking status: Every Day    Packs/day: 1.00    Years: 41.00    Additional pack years: 0.00    Total pack years: 41.00    Types: Cigarettes   Smokeless tobacco: Never   Tobacco comments:    1 pack a day reported but is trying to cut back to stop  Vaping Use   Vaping Use: Never used  Substance and Sexual Activity   Alcohol use: Not Currently    Alcohol/week: 6.0 standard  drinks of alcohol    Types: 6 Cans of beer per week    Comment: 1-2 times weekly; 4 "40s"   Drug use: Not Currently   Sexual activity: Not Currently  Other Topics Concern   Not on file  Social History Narrative   Lives with 1 roommate   Social Determinants of Health   Financial Resource Strain: Medium Risk (05/28/2022)   Overall Financial Resource Strain (CARDIA)    Difficulty of Paying Living Expenses: Somewhat hard  Food Insecurity: No Food Insecurity (02/10/2020)   Hunger Vital Sign    Worried About Running Out of Food in the Last Year: Never true    Ran Out of Food in the Last Year: Never true  Transportation Needs: No Transportation Needs (02/10/2020)   PRAPARE - Transportation    Lack of Transportation (Medical): No    Lack of Transportation (Non-Medical): No  Physical Activity: Inactive (05/28/2022)   Exercise Vital Sign    Days of Exercise per Week: 0 days    Minutes of Exercise per Session: 0 min  Stress: Stress Concern Present (05/28/2022)   [employer] - Occupational Stress Questionnaire    Feeling of Stress : Very much  Social Connections: Socially Isolated (05/28/2022)   Social Connection and Isolation Panel [NHANES]    Frequency of Communication with Friends and Family: Twice a week    Frequency of Social Gatherings with Friends and Family: Once a week    Attends Religious Services: Never    Active Member of Clubs or Organizations: No    Attends Club or Organization Meetings: Never    Marital Status: Widowed  Intimate Partner Violence: Not At Risk (02/10/2020)   Humiliation, Afraid, Rape, and Kick questionnaire    Fear of Current or Ex-Partner: No    Emotionally Abused: No    Physically Abused: No    Sexually Abused: No     Family History  Family history unknown: Yes    ROS: Otherwise negative unless mentioned in HPI  Physical Examination  Vitals:   11/02/22 1103  BP: 99/67  Pulse: 99  Resp: 18  Temp: 98.8 F (37.1  C)  SpO2: 98%   Body mass index is 28.28 kg/m.  General:  WDWN in NAD Gait: Not observed HENT: WNL, normocephalic Pulmonary: normal non-labored breathing, without   Rales, rhonchi,  wheezing Cardiac: regular, without  Murmurs, rubs or gallops; without carotid bruits Abdomen: Positive bowel sounds, soft, NT/ND, no masses Skin: without rashes Vascular Exam/Pulses: Unable to palpate bilateral lower extremity pulses, +2 Edema Warm to touch Extremities: with ischemic changes, without Gangrene , with cellulitis; with open wounds;  Musculoskeletal: no muscle wasting or atrophy  Neurologic: A&O X 3;  No focal weakness or paresthesias are detected; speech is fluent/normal Psychiatric:  The pt has Abnormal- Flat  affect. Lymph:  Unremarkable  CBC    Component Value Date/Time   WBC 14.8 (H) 11/02/2022 1121   RBC 3.81 (L) 11/02/2022 1121   HGB 10.9 (L) 11/02/2022 1121   HGB 14.6 06/22/2020 1058   HCT 33.5 (L) 11/02/2022 1121   HCT 41.8 06/22/2020 1058   PLT 436 (H) 11/02/2022 1121   PLT 301 06/22/2020 1058   MCV 87.9 11/02/2022 1121   MCV 96 06/22/2020 1058   MCH 28.6 11/02/2022 1121   MCHC 32.5 11/02/2022 1121   RDW 12.8 11/02/2022 1121   RDW 13.1 06/22/2020 1058   LYMPHSABS 0.8 11/02/2022 1121   LYMPHSABS 1.2 06/22/2020 1058   MONOABS 1.5 (H) 11/02/2022 1121   EOSABS 0.1 11/02/2022 1121   EOSABS 0.1 06/22/2020 1058   BASOSABS 0.1 11/02/2022 1121   BASOSABS 0.0 06/22/2020 1058    BMET    Component Value Date/Time   NA 127 (L) 11/02/2022 1121   NA 134 06/22/2020 1058   K 4.3 11/02/2022 1121   CL 88 (L) 11/02/2022 1121   CO2 22 11/02/2022 1121   GLUCOSE 437 (H) 11/02/2022 1121   BUN 20 11/02/2022 1121   BUN 8 06/22/2020 1058   CREATININE 1.05 11/02/2022 1121   CALCIUM 8.7 (L) 11/02/2022 1121   GFRNONAA >60 11/02/2022 1121   GFRAA 107 06/22/2020 1058    COAGS: Lab Results  Component Value Date   INR 1.2 11/02/2022   INR 1.2 08/14/2020   INR 0.9 03/16/2020      Non-Invasive Vascular Imaging:   MRI of the right Ankle Ordered.  Statin:  Yes.   Beta Blocker:  Yes.   Aspirin:  No. ACEI:  No. ARB:  No. CCB use:  Yes Other antiplatelets/anticoagulants:  Yes.   Eliquis 5 mg BID   ASSESSMENT/PLAN: This is a 62 y.o. male with a medical history of diabetes mellitus, hypertension, hyperlipidemia, COPD, depression with anxiety, PTSD, bipolar, DVT on Eliquis, with bilateral foot ulcers. Patient has failed Doxycycline therapy by Dr Cline from Podiatry. Vascular Surgery consulted due to open wounds. Patient will have MRI of the right foot/Ankle prior to procedure.   PLAN: Per vascular surgery patient will be taken to the vascular lab on Monday, 11/05/2022 for right lower extremity angiogram with possible intervention.  I discussed in detail with the patient the procedure, benefits, risks, and complications.  He verbalizes understanding.  I answered all the patient's questions.  He would like to proceed as soon as possible.  Patient will be made n.p.o. after midnight on Monday, 11/05/2022.   -I discussed the plan in detail with Dr. Jason Dew MD and he is in agreement with the plan   Marion Rosenberry R Merrilyn Legler [facility] 11/02/2022 1:41 PM  

## 2022-11-02 NOTE — ED Notes (Addendum)
Called inpatient unit to find out about assigned RN, was told to call back in 5 mins.

## 2022-11-02 NOTE — ED Triage Notes (Signed)
Pt here bilateral feet wounds. Pt states he was told by his podiatrist to come for admission. Pt is having pain in both feet.

## 2022-11-02 NOTE — Progress Notes (Signed)
ANTICOAGULATION CONSULT NOTE  Pharmacy Consult for heparin innfusion Indication: h/o DVT  Allergies  Allergen Reactions   Bee Venom Anaphylaxis   Cheese Anaphylaxis, Swelling and Other (See Comments)    Blue Cheese only   Coffea Arabica Shortness Of Breath   Coffee Flavor Shortness Of Breath    Patient Measurements: Height: 5\' 10"  (177.8 cm) Weight: 89.4 kg (197 lb 1.5 oz) IBW/kg (Calculated) : 73 Heparin Dosing Weight: 89.4 kg  Vital Signs: Temp: 98.8 F (37.1 C) (05/17 1103) Temp Source: Oral (05/17 1103) BP: 99/67 (05/17 1103) Pulse Rate: 99 (05/17 1103)  Labs: Recent Labs    11/02/22 1121  HGB 10.9*  HCT 33.5*  PLT 436*  LABPROT 15.3*  INR 1.2  CREATININE 1.05    Estimated Creatinine Clearance: 84.2 mL/min (by C-G formula based on SCr of 1.05 mg/dL).   Medical History: Past Medical History:  Diagnosis Date   Anxiety    COPD (chronic obstructive pulmonary disease) (HCC)    Depression    Diabetes mellitus without complication (HCC)    GERD (gastroesophageal reflux disease)    Hypercholesteremia    Hypertension    Pneumonia    PTSD (post-traumatic stress disorder)     Assessment: 62 yr old man admitted with empyema, S/P VATs 2/28 on apixaban PTA. Pharmacy was consulted to dose IV heparin as we transition for a surgical procedure on 05/18  Goal of Therapy:  Heparin level 0.3-0.7 units/ml aPTT 66 - 102 seconds Monitor platelets by anticoagulation protocol: Yes   Plan:  ---Start heparin infusion at 3000 units/hr (rate based on data from previous admission) ---Check aPTT level in 6 hours and daily while on heparin (we will use aPTT to guide therapy until aPTT and anti-Xa level correlate) ---Continue to monitor H&H and platelets  Lowella Bandy 11/02/2022,1:16 PM

## 2022-11-02 NOTE — Progress Notes (Signed)
ANTICOAGULATION CONSULT NOTE  Pharmacy Consult for heparin innfusion Indication: h/o DVT  Allergies  Allergen Reactions   Bee Venom Anaphylaxis   Cheese Anaphylaxis, Swelling and Other (See Comments)    Blue Cheese only   Coffea Arabica Shortness Of Breath   Coffee Flavor Shortness Of Breath    Patient Measurements: Height: 5\' 10"  (177.8 cm) Weight: 89.4 kg (197 lb 1.5 oz) IBW/kg (Calculated) : 73 Heparin Dosing Weight: 89.4 kg  Vital Signs: Temp: 98.4 F (36.9 C) (05/17 1937) Temp Source: Oral (05/17 1937) BP: 146/81 (05/17 1937) Pulse Rate: 96 (05/17 1937)  Labs: Recent Labs    11/02/22 1121 11/02/22 1825 11/02/22 2045  HGB 10.9*  --   --   HCT 33.5*  --   --   PLT 436*  --   --   APTT  --  116* 149*  LABPROT 15.3* 16.7*  --   INR 1.2 1.3*  --   CREATININE 1.05  --   --     Estimated Creatinine Clearance: 84.2 mL/min (by C-G formula based on SCr of 1.05 mg/dL).  Medical History: Past Medical History:  Diagnosis Date   Anxiety    COPD (chronic obstructive pulmonary disease) (HCC)    Depression    Diabetes mellitus without complication (HCC)    GERD (gastroesophageal reflux disease)    Hypercholesteremia    Hypertension    Pneumonia    PTSD (post-traumatic stress disorder)     Assessment: 62 yr old man admitted with empyema, S/P VATs 2/28 on apixaban PTA. Pharmacy was consulted to dose IV heparin as we transition for a surgical procedure on 05/18  Results: Date/time aPTT/HL Comments 5/17@2045  aPTT=149 Supra-therapeutic@ 3000 un/hr > 2600 un/hr  Goal of Therapy:  Heparin level 0.3-0.7 units/ml aPTT 66 - 102 seconds Monitor platelets by anticoagulation protocol: Yes   Plan:   HOLD heparin infusion for 1 hrs (RN notified), then resume at  2600 units/hr. Check aPTT level in 6 hours after rate change and daily while on heparin (we will use aPTT to guide therapy until aPTT and anti-Xa level correlate) Continue to monitor H&H and platelets  Yoshi Mancillas  Rodriguez-Guzman PharmD, BCPS 11/02/2022 9:57 PM

## 2022-11-02 NOTE — Consult Note (Signed)
Hospital Consult    Reason for Consult:  Bilateral Lower Extremity Non Healing Wounds Requesting Physician:  Dr Lorretta Harp MD MRN #:  [id number]  History of Present Illness: This is a 62 y.o. male w/ PMH of HTN, hLD, DM, COPD, CHF, depression with anxiety, PTSD, bipolar, DVT on Eliquis admitted on 11/02/2022 with cellulitis and open sores to bilateral lower extremities. This according to the patient all started about 2 months ago. Patient has been seen prior in 03/2020 by Dr Vilinda Flake MD for LLE Angiogram. Patient also endorses multiple toe amputations by Dr Alberteen Spindle in 09/21 and 10/21.   On exam the patient endorses pain to his right lower extremity. There is a malodorous smell from this open wound. Both his lower extremities are warm to the touch from Knee down. Patient reports having a stent placed in his left thigh years ago. No other complaints and vitals all remain stable.   Past Medical History:  Diagnosis Date   Anxiety    COPD (chronic obstructive pulmonary disease) (HCC)    Depression    Diabetes mellitus without complication (HCC)    GERD (gastroesophageal reflux disease)    Hypercholesteremia    Hypertension    Pneumonia    PTSD (post-traumatic stress disorder)     Past Surgical History:  Procedure Laterality Date   AMPUTATION Left 03/22/2020   Procedure: AMPUTATION RAY-1st Ray Left Foot;  Surgeon: Linus Galas, [name];  Location: [facility];  Service: Podiatry;  Laterality: Left;   AMPUTATION TOE Left 03/16/2020   Procedure: AMPUTATION GREAT TOE;  Surgeon: Linus Galas, [name];  Location: [facility];  Service: Podiatry;  Laterality: Left;   AMPUTATION TOE Left 04/06/2022   Procedure: AMPUTATION TOE;  Surgeon: Linus Galas, [name];  Location: [facility];  Service: Podiatry;  Laterality: Left;   COLONOSCOPY WITH PROPOFOL N/A 12/22/2019   Procedure: COLONOSCOPY WITH PROPOFOL;  Surgeon: Midge Minium, MD;  Location: [facility];  Service: Endoscopy;  Laterality: N/A;   INCISION AND DRAINAGE  ABSCESS Left 08/11/2021   Procedure: INCISION AND DRAINAGE ABSCESS;  Surgeon: Linus Salmons, MD;  Location: [facility];  Service: ENT;  Laterality: Left;   LOWER EXTREMITY ANGIOGRAPHY Left 03/18/2020   Procedure: Lower Extremity Angiography;  Surgeon: Renford Dills, MD;  Location: [facility] INVASIVE CV [facility];  Service: Cardiovascular;  Laterality: Left;   TONSILLECTOMY     VIDEO ASSISTED THORACOSCOPY (VATS)/DECORTICATION Left 08/15/2020   Procedure: VIDEO ASSISTED THORACOSCOPY (VATS)/DECORTICATION;  Surgeon: Corliss Skains, MD;  Location: [facility] OR;  Service: Thoracic;  Laterality: Left;   WISDOM TOOTH EXTRACTION      Allergies  Allergen Reactions   Bee Venom Anaphylaxis   Cheese Anaphylaxis, Swelling and Other (See Comments)    Blue Cheese only   Coffea Arabica Shortness Of Breath   Coffee Flavor Shortness Of Breath    Prior to Admission medications   Medication Sig Start Date End Date Taking? Authorizing Provider  acetaminophen (TYLENOL) 650 MG CR tablet Take 1,300 mg by mouth every 8 (eight) hours as needed for pain.   Yes [provider]  albuterol (VENTOLIN HFA) 108 (90 Base) MCG/ACT inhaler INHALE 1 PUFF INTO THE LUNGS ONCE EVERY 6 HOURS AS NEEDED FOR SHORTNESS OF BREATH OR WHEEZING. Patient taking differently: Inhale 1 puff into the lungs every 6 (six) hours as needed for wheezing or shortness of breath. 01/16/21  Yes   amLODipine (NORVASC) 5 MG tablet Take 1 tablet (5 mg total) by mouth once daily. 01/16/21  Yes   apixaban (  ELIQUIS) 5 MG TABS tablet TAKE ONE TABLET (5MG  TOTAL) BY MOUTH TWICE DAILY. Patient taking differently: Take 5 mg by mouth 2 (two) times daily. 01/16/21  Yes   ARIPiprazole (ABILIFY) 10 MG tablet Take 1 tablet (10 mg total) by mouth daily. 08/10/22 11/08/22 Yes Nwoko, Tommas Olp, PA  Budeson-Glycopyrrol-Formoterol (BREZTRI AEROSPHERE) 160-9-4.8 MCG/ACT AERO Inhale 2 puffs into the lungs in the morning and at bedtime. 08/09/22  Yes Salena Saner, MD   buPROPion [facility] SR) 150 MG 12 hr tablet Take 150 mg by mouth 2 (two) times daily.   Yes [provider]  cloNIDine (CATAPRES) 0.1 MG tablet Take 1 tablet (0.1 mg total) by mouth at bedtime. 08/10/22 11/08/22 Yes Nwoko, Tommas Olp, PA  cyanocobalamin (VITAMIN B12) 1000 MCG tablet Take 1,000 mcg by mouth daily.   Yes [provider]  doxycycline (VIBRAMYCIN) 100 MG capsule Take 100 mg by mouth 2 (two) times daily. 10/13/22  Yes [provider]  gabapentin (NEURONTIN) 400 MG capsule TAKE ONE CAPSULE (400MG  TOTAL) BY MOUTH THREE TIMES DAILY. Patient taking differently: Take 400 mg by mouth 3 (three) times daily. 01/16/21  Yes   gentamicin ointment (GARAMYCIN) 0.1 % Apply topically in the morning and at bedtime. To left side of face. 08/12/21  Yes Esaw Grandchild A, DO  glipiZIDE (GLUCOTROL) 10 MG tablet TAKE ONE TABLET (10MG  TOTAL) BY MOUTH TWICE DAILY. Patient taking differently: Take 10 mg by mouth 2 (two) times daily before a meal. 02/16/21  Yes Iloabachie, Chioma E, NP  insulin glargine (LANTUS) 100 UNIT/ML Solostar Pen Inject 20 Units into the skin at bedtime. Patient taking differently: Inject 42 Units into the skin daily. In AM 08/14/21  Yes Esaw Grandchild A, DO  magnesium oxide (MAG-OX) 400 MG tablet Take 800 mg by mouth 2 (two) times daily.   Yes [provider]  metFORMIN (GLUCOPHAGE) 1000 MG tablet TAKE ONE TABLET (1,000MG  TOTAL) BY MOUTH TWICE DAILY. Patient taking differently: Take 1,000 mg by mouth 2 (two) times daily with a meal. 01/16/21  Yes   metoprolol succinate (TOPROL-XL) 50 MG 24 hr tablet Take 50 mg by mouth daily. 07/21/21  Yes [provider]  pantoprazole (PROTONIX) 40 MG tablet TAKE ONE TABLET (40MG  TOTAL) BY MOUTH ONCE DAILY. Patient taking differently: Take 40 mg by mouth daily. 01/16/21  Yes   rosuvastatin (CRESTOR) 5 MG tablet TAKE ONE TABLET (5MG  TOTAL) BY MOUTH ONCE DAILY. Patient taking differently: Take 5 mg by mouth daily. 01/16/21   Yes   venlafaxine XR (EFFEXOR XR) 150 MG 24 hr capsule Take 1 capsule (150 mg total) by mouth daily with breakfast. Patient to take medication with 75 mg tablet for (225 mg total). 08/10/22 11/08/22 Yes Nwoko, Tommas Olp, PA  venlafaxine XR (EFFEXOR-XR) 75 MG 24 hr capsule Take 1 capsule (75 mg total) by mouth daily. Patient to take medication with 150 mg tablet for (225 mg total). 08/10/22 08/10/23 Yes Nwoko, Uchenna E, PA  blood glucose meter kit and supplies KIT Dispense based on patient and insurance preference. Use up to four times daily as directed. (FOR ICD-9 250.00, 250.01). 10/15/19   Iloabachie, Chioma E, NP  blood glucose meter kit and supplies KIT Dispense based on patient and insurance preference. Use up to four times daily as directed. 08/12/21   Pennie Banter, DO  Dextromethorphan-guaiFENesin (MUCINEX DM) 30-600 MG TB12 Take 1 tablet by mouth 2 (two) times daily as needed (cough). Patient not taking: Reported on 08/10/2022 06/27/22   Clent Ridges,  Earnstine Regal, NP  Insulin Pen Needle (PEN NEEDLES) 32G X 6 MM MISC 1 each by Does not apply route daily. 08/12/21   Pennie Banter, DO  citalopram (CELEXA) 20 MG tablet Take 1 tablet (20 mg total) by mouth daily. Patient taking differently: Take 20 mg by mouth at bedtime. 11/09/20 01/17/21  Rolm Gala, NP    Social History   Socioeconomic History   Marital status: Single    Spouse name: Not on file   Number of children: 0   Years of education: Not on file   Highest education level: Not on file  Occupational History   Occupation: Unemployed  Tobacco Use   Smoking status: Every Day    Packs/day: 1.00    Years: 41.00    Additional pack years: 0.00    Total pack years: 41.00    Types: Cigarettes   Smokeless tobacco: Never   Tobacco comments:    1 pack a day reported but is trying to cut back to stop  Vaping Use   Vaping Use: Never used  Substance and Sexual Activity   Alcohol use: Not Currently    Alcohol/week: 6.0 standard  drinks of alcohol    Types: 6 Cans of beer per week    Comment: 1-2 times weekly; 4 "40s"   Drug use: Not Currently   Sexual activity: Not Currently  Other Topics Concern   Not on file  Social History Narrative   Lives with 1 roommate   Social Determinants of Health   Financial Resource Strain: Medium Risk (05/28/2022)   Overall Financial Resource Strain (CARDIA)    Difficulty of Paying Living Expenses: Somewhat hard  Food Insecurity: No Food Insecurity (02/10/2020)   Hunger Vital Sign    Worried About Running Out of Food in the Last Year: Never true    Ran Out of Food in the Last Year: Never true  Transportation Needs: No Transportation Needs (02/10/2020)   PRAPARE - Administrator, Civil Service (Medical): No    Lack of Transportation (Non-Medical): No  Physical Activity: Inactive (05/28/2022)   Exercise Vital Sign    Days of Exercise per Week: 0 days    Minutes of Exercise per Session: 0 min  Stress: Stress Concern Present (05/28/2022)   [employer] of Occupational Health - Occupational Stress Questionnaire    Feeling of Stress : Very much  Social Connections: Socially Isolated (05/28/2022)   Social Connection and Isolation Panel [NHANES]    Frequency of Communication with Friends and Family: Twice a week    Frequency of Social Gatherings with Friends and Family: Once a week    Attends Religious Services: Never    Database administrator or Organizations: No    Attends Banker Meetings: Never    Marital Status: Widowed  Intimate Partner Violence: Not At Risk (02/10/2020)   Humiliation, Afraid, Rape, and Kick questionnaire    Fear of Current or Ex-Partner: No    Emotionally Abused: No    Physically Abused: No    Sexually Abused: No     Family History  Family history unknown: Yes    ROS: Otherwise negative unless mentioned in HPI  Physical Examination  Vitals:   11/02/22 1103  BP: 99/67  Pulse: 99  Resp: 18  Temp: 98.8 F (37.1  C)  SpO2: 98%   Body mass index is 28.28 kg/m.  General:  WDWN in NAD Gait: Not observed HENT: WNL, normocephalic Pulmonary: normal non-labored breathing, without  Rales, rhonchi,  wheezing Cardiac: regular, without  Murmurs, rubs or gallops; without carotid bruits Abdomen: Positive bowel sounds, soft, NT/ND, no masses Skin: without rashes Vascular Exam/Pulses: Unable to palpate bilateral lower extremity pulses, +2 Edema Warm to touch Extremities: with ischemic changes, without Gangrene , with cellulitis; with open wounds;  Musculoskeletal: no muscle wasting or atrophy  Neurologic: A&O X 3;  No focal weakness or paresthesias are detected; speech is fluent/normal Psychiatric:  The pt has Abnormal- Flat  affect. Lymph:  Unremarkable  CBC    Component Value Date/Time   WBC 14.8 (H) 11/02/2022 1121   RBC 3.81 (L) 11/02/2022 1121   HGB 10.9 (L) 11/02/2022 1121   HGB 14.6 06/22/2020 1058   HCT 33.5 (L) 11/02/2022 1121   HCT 41.8 06/22/2020 1058   PLT 436 (H) 11/02/2022 1121   PLT 301 06/22/2020 1058   MCV 87.9 11/02/2022 1121   MCV 96 06/22/2020 1058   MCH 28.6 11/02/2022 1121   MCHC 32.5 11/02/2022 1121   RDW 12.8 11/02/2022 1121   RDW 13.1 06/22/2020 1058   LYMPHSABS 0.8 11/02/2022 1121   LYMPHSABS 1.2 06/22/2020 1058   MONOABS 1.5 (H) 11/02/2022 1121   EOSABS 0.1 11/02/2022 1121   EOSABS 0.1 06/22/2020 1058   BASOSABS 0.1 11/02/2022 1121   BASOSABS 0.0 06/22/2020 1058    BMET    Component Value Date/Time   NA 127 (L) 11/02/2022 1121   NA 134 06/22/2020 1058   K 4.3 11/02/2022 1121   CL 88 (L) 11/02/2022 1121   CO2 22 11/02/2022 1121   GLUCOSE 437 (H) 11/02/2022 1121   BUN 20 11/02/2022 1121   BUN 8 06/22/2020 1058   CREATININE 1.05 11/02/2022 1121   CALCIUM 8.7 (L) 11/02/2022 1121   GFRNONAA >60 11/02/2022 1121   GFRAA 107 06/22/2020 1058    COAGS: Lab Results  Component Value Date   INR 1.2 11/02/2022   INR 1.2 08/14/2020   INR 0.9 03/16/2020      Non-Invasive Vascular Imaging:   MRI of the right Ankle Ordered.  Statin:  Yes.   Beta Blocker:  Yes.   Aspirin:  No. ACEI:  No. ARB:  No. CCB use:  Yes Other antiplatelets/anticoagulants:  Yes.   Eliquis 5 mg BID   ASSESSMENT/PLAN: This is a 62 y.o. male with a medical history of diabetes mellitus, hypertension, hyperlipidemia, COPD, depression with anxiety, PTSD, bipolar, DVT on Eliquis, with bilateral foot ulcers. Patient has failed Doxycycline therapy by Dr Alberteen Spindle from Podiatry. Vascular Surgery consulted due to open wounds. Patient will have MRI of the right foot/Ankle prior to procedure.   PLAN: Per vascular surgery patient will be taken to the vascular lab on Monday, 11/05/2022 for right lower extremity angiogram with possible intervention.  I discussed in detail with the patient the procedure, benefits, risks, and complications.  He verbalizes understanding.  I answered all the patient's questions.  He would like to proceed as soon as possible.  Patient will be made n.p.o. after midnight on Monday, 11/05/2022.   -I discussed the plan in detail with Dr. Festus Barren MD and he is in agreement with the plan   Marcie Bal [facility] 11/02/2022 1:41 PM

## 2022-11-02 NOTE — H&P (Signed)
History and Physical    Timothy Ragno. GNF:[id number] DOB: [birth year] DOA: 11/02/2022  Referring MD/NP/PA:   PCP: Nira Retort   Patient coming from:  The patient is coming from home.     Chief Complaint: bilateral foot ulcer and pain  HPI: Timothy Bras. is a 62 y.o. male with medical history significant of hypertension, hyperlipidemia, diabetes mellitus, COPD on 2-3 L oxygen (2 L in the daytime, and 3L in night), diastolic CHF, depression with anxiety, PTSD, bipolar, DVT on Eliquis, who presents with bilateral foot ulcer and pain.  Patient has bilateral foot ulcer for more than 2 months.  Patient has been following up with podiatrist and is currently taking doxycycline, but no improvement.  Patient was seen by Dr. Alberteen Spindle of podiatry today, and found to have worsening bilateral foot ulcers.  Dr. Alberteen Spindle recommended to admit patient to hospital for IV antibiotics treatment.  Patient has a large and deep ulcer in right heel  and a small ulcer in the left plantar area.  He has pain in both feet, worse on the right foot.  The pain is constant, moderate to severe, sharp, nonradiating.  Denies fever or chills.  Patient has mild dry cough due to COPD, denies shortness of breath or chest pain.  Has nausea, no vomiting, diarrhea or abdominal pain.  No symptoms of UTI.  Data reviewed independently and ED Course: pt was found to have WBC 14.8, lactic acid 1.3, BNP 72.6, GFR> 60, pseudohyponatremia, blood sugar 437 (anion gap 17, bicarbonate of 22, beta hydroxybutyric acid 2.55, pH 7.43 on VBG), temperature normal, blood pressure 99/67, heart rate 99, RR 18, oxygen saturation 98% on room air.  Patient is admitted to telemetry bed as inpatient.  Dr. Ether Griffins of podiatry and Dr. Wyn Quaker of [facility] are consulted.   MRI-right ankle: 1. Large deep skin wound about the lateral aspect of the calcaneus measuring at least 3.5 x 1.0 x 2.4 cm. 2. Bone marrow edema of the lateral aspect of the calcaneus  adjacent to the above-mentioned deep skin wound consistent with osteomyelitis. 3. Fluid tracking along the peroneus brevis and peroneus longus tendon suggesting tenosynovitis. 4. Mild edema along the distal Achilles tendon suggesting peritenonitis, likely secondary to adjacent infectious/inflammatory process. 5. Evaluation of ankle and subtalar joints is limited due to motion.   X-ray RIGHT FOOT:  No cortical erosion or periosteal reaction to suggest osteomyelitis. Soft tissue swelling about the heel and lateral aspect of the ankle.   X-ray of LEFT FOOT:  Postsurgical changes for prior amputation through the distal first metatarsal and second metatarsophalangeal joint without evidence of osteomyelitis.    EKG: I have personally reviewed.  Sinus rhythm, QTc 494, borderline left axis deviation, right bundle blockade, early R wave progression   Review of Systems:   General: no fevers, chills, no body weight gain, fatigue HEENT: no blurry vision, hearing changes or sore throat Respiratory: no dyspnea, has coughing, no wheezing CV: no chest pain, no palpitations GI: has nausea, no vomiting, abdominal pain, diarrhea, constipation GU: no dysuria, burning on urination, increased urinary frequency, hematuria  Ext: no leg edema Neuro: no unilateral weakness, numbness, or tingling, no vision change or hearing loss Skin: has ulcers in both feet MSK: No muscle spasm, no deformity, no limitation of range of movement in spin Heme: No easy bruising.  Travel history: No recent long distant travel.   Allergy:  Allergies  Allergen Reactions   Bee Venom Anaphylaxis   Cheese Anaphylaxis, Swelling and  Other (See Comments)    Blue Cheese only   Coffea Arabica Shortness Of Breath   Coffee Flavor Shortness Of Breath    Past Medical History:  Diagnosis Date   Anxiety    COPD (chronic obstructive pulmonary disease) (HCC)    Depression    Diabetes mellitus without complication (HCC)     GERD (gastroesophageal reflux disease)    Hypercholesteremia    Hypertension    Pneumonia    PTSD (post-traumatic stress disorder)     Past Surgical History:  Procedure Laterality Date   AMPUTATION Left 03/22/2020   Procedure: AMPUTATION RAY-1st Ray Left Foot;  Surgeon: Linus Galas, [name];  Location: [facility] ORS;  Service: Podiatry;  Laterality: Left;   AMPUTATION TOE Left 03/16/2020   Procedure: AMPUTATION GREAT TOE;  Surgeon: Linus Galas, [name];  Location: [facility] ORS;  Service: Podiatry;  Laterality: Left;   AMPUTATION TOE Left 04/06/2022   Procedure: AMPUTATION TOE;  Surgeon: Linus Galas, [name];  Location: [facility] ORS;  Service: Podiatry;  Laterality: Left;   COLONOSCOPY WITH PROPOFOL N/A 12/22/2019   Procedure: COLONOSCOPY WITH PROPOFOL;  Surgeon: Midge Minium, MD;  Location: [facility] ENDOSCOPY;  Service: Endoscopy;  Laterality: N/A;   INCISION AND DRAINAGE ABSCESS Left 08/11/2021   Procedure: INCISION AND DRAINAGE ABSCESS;  Surgeon: Linus Salmons, MD;  Location: [facility] ORS;  Service: ENT;  Laterality: Left;   LOWER EXTREMITY ANGIOGRAPHY Left 03/18/2020   Procedure: Lower Extremity Angiography;  Surgeon: Renford Dills, MD;  Location: [facility] INVASIVE CV LAB;  Service: Cardiovascular;  Laterality: Left;   TONSILLECTOMY     VIDEO ASSISTED THORACOSCOPY (VATS)/DECORTICATION Left 08/15/2020   Procedure: VIDEO ASSISTED THORACOSCOPY (VATS)/DECORTICATION;  Surgeon: Corliss Skains, MD;  Location: [facility] OR;  Service: Thoracic;  Laterality: Left;   WISDOM TOOTH EXTRACTION      Social History:  reports that he has been smoking cigarettes. He has a 41.00 pack-year smoking history. He has never used smokeless tobacco. He reports that he does not currently use alcohol after a past usage of about 6.0 standard drinks of alcohol per week. He reports that he does not currently use drugs.  Family History: I have reviewed with patient about family medical history, but pt states that he does not know any detailed  information about his family medical history.  Family History  Family history unknown: Yes     Prior to Admission medications   Medication Sig Start Date End Date Taking? Authorizing Provider  acetaminophen (TYLENOL) 650 MG CR tablet Take 1,300 mg by mouth every 8 (eight) hours as needed for pain.    [provider]  albuterol (VENTOLIN HFA) 108 (90 Base) MCG/ACT inhaler INHALE 1 PUFF INTO THE LUNGS ONCE EVERY 6 HOURS AS NEEDED FOR SHORTNESS OF BREATH OR WHEEZING. 01/16/21     amLODipine (NORVASC) 5 MG tablet Take 1 tablet (5 mg total) by mouth once daily. 01/16/21     apixaban (ELIQUIS) 5 MG TABS tablet TAKE ONE TABLET (5MG  TOTAL) BY MOUTH TWICE DAILY. 01/16/21     ARIPiprazole (ABILIFY) 10 MG tablet Take 1 tablet (10 mg total) by mouth daily. 08/10/22 11/08/22  Nwoko, Tommas Olp, PA  azithromycin (ZITHROMAX) 250 MG tablet Take as directed in the package. 08/09/22   Salena Saner, MD  blood glucose meter kit and supplies KIT Dispense based on patient and insurance preference. Use up to four times daily as directed. (FOR ICD-9 250.00, 250.01). 10/15/19   Iloabachie, Chioma E, NP  blood glucose meter kit and supplies KIT  Dispense based on patient and insurance preference. Use up to four times daily as directed. 08/12/21   Pennie Banter, DO  Budeson-Glycopyrrol-Formoterol (BREZTRI AEROSPHERE) 160-9-4.8 MCG/ACT AERO Inhale 2 puffs into the lungs in the morning and at bedtime. 08/09/22   Salena Saner, MD  buPROPion HCl (WELLBUTRIN PO) Take 2 tablets by mouth daily.    [provider]  cloNIDine (CATAPRES) 0.1 MG tablet Take 1 tablet (0.1 mg total) by mouth at bedtime. 08/10/22 11/08/22  Nwoko, Tommas Olp, PA  cyanocobalamin (VITAMIN B12) 1000 MCG tablet Take 1,000 mcg by mouth daily.    [provider]  Dextromethorphan-guaiFENesin (MUCINEX DM) 30-600 MG TB12 Take 1 tablet by mouth 2 (two) times daily as needed (cough). Patient not taking: Reported on 08/10/2022 06/27/22    Glenford Bayley, NP  gabapentin (NEURONTIN) 400 MG capsule TAKE ONE CAPSULE (400MG  TOTAL) BY MOUTH THREE TIMES DAILY. Patient taking differently: Take by mouth 4 (four) times daily. 01/16/21     gentamicin ointment (GARAMYCIN) 0.1 % Apply topically in the morning and at bedtime. To left side of face. 08/12/21   Esaw Grandchild A, DO  glipiZIDE (GLUCOTROL) 10 MG tablet TAKE ONE TABLET (10MG  TOTAL) BY MOUTH TWICE DAILY. 02/16/21   Iloabachie, Chioma E, NP  insulin glargine (LANTUS) 100 UNIT/ML Solostar Pen Inject 20 Units into the skin at bedtime. Patient taking differently: Inject 42 Units into the skin daily. In AM 08/14/21   Esaw Grandchild A, DO  Insulin Pen Needle (PEN NEEDLES) 32G X 6 MM MISC 1 each by Does not apply route daily. 08/12/21   Esaw Grandchild A, DO  magnesium oxide (MAG-OX) 400 MG tablet Take 800 mg by mouth 2 (two) times daily.    [provider]  metFORMIN (GLUCOPHAGE) 1000 MG tablet TAKE ONE TABLET (1,000MG  TOTAL) BY MOUTH TWICE DAILY. 01/16/21     metoprolol succinate (TOPROL-XL) 50 MG 24 hr tablet Take 1 tablet by mouth daily. 07/21/21   [provider]  pantoprazole (PROTONIX) 40 MG tablet TAKE ONE TABLET (40MG  TOTAL) BY MOUTH ONCE DAILY. 01/16/21     rosuvastatin (CRESTOR) 5 MG tablet TAKE ONE TABLET (5MG  TOTAL) BY MOUTH ONCE DAILY. 01/16/21     venlafaxine XR (EFFEXOR XR) 150 MG 24 hr capsule Take 1 capsule (150 mg total) by mouth daily with breakfast. Patient to take medication with 75 mg tablet for (225 mg total). 08/10/22 11/08/22  Meta Hatchet, PA  venlafaxine XR (EFFEXOR-XR) 75 MG 24 hr capsule Take 1 capsule (75 mg total) by mouth daily. Patient to take medication with 150 mg tablet for (225 mg total). 08/10/22 08/10/23  Meta Hatchet, PA  citalopram (CELEXA) 20 MG tablet Take 1 tablet (20 mg total) by mouth daily. Patient taking differently: Take 20 mg by mouth at bedtime. 11/09/20 01/17/21  Rolm Gala, NP    Physical Exam: Vitals:   11/02/22 1500  11/02/22 1530 11/02/22 1700 11/02/22 1730  BP: (!) 141/81 (!) 143/83  133/77  Pulse: 86 85  86  Resp: (!) 23 (!) 25    Temp:   98.4 F (36.9 C)   TempSrc:      SpO2: 98% 98%  96%  Weight:      Height:       General: Not in acute distress HEENT:       Eyes: PERRL, EOMI, no scleral icterus.       ENT: No discharge from the ears and nose, no pharynx injection, no tonsillar enlargement.  Neck: No JVD, no bruit, no mass felt. Heme: No neck lymph node enlargement. Cardiac: S1/S2, RRR, No murmurs, No gallops or rubs. Respiratory: No rales, wheezing, rhonchi or rubs. GI: Soft, nondistended, nontender, no rebound pain, no organomegaly, BS present. GU: No hematuria Ext: No pitting leg edema bilaterally. 1+DP/PT pulse bilaterally. Musculoskeletal: No joint deformities, No joint redness or warmth, no limitation of ROM in spin. Skin: pt has a large and deep ulcer in right heel with mild surrounding erythema, and a small ulcer in left plantar area.         Neuro: Alert, oriented X3, cranial nerves II-XII grossly intact, moves all extremities normally.  Psych: Patient is not psychotic, no suicidal or hemocidal ideation.  Labs on Admission: I have personally reviewed following labs and imaging studies  CBC: Recent Labs  Lab 11/02/22 1121  WBC 14.8*  NEUTROABS 12.3*  HGB 10.9*  HCT 33.5*  MCV 87.9  PLT 436*   Basic Metabolic Panel: Recent Labs  Lab 11/02/22 1121  NA 127*  K 4.3  CL 88*  CO2 22  GLUCOSE 437*  BUN 20  CREATININE 1.05  CALCIUM 8.7*   GFR: Estimated Creatinine Clearance: 84.2 mL/min (by C-G formula based on SCr of 1.05 mg/dL). Liver Function Tests: Recent Labs  Lab 11/02/22 1121  AST 16  ALT 13  ALKPHOS 163*  BILITOT 1.0  PROT 7.7  ALBUMIN 2.8*   No results for input(s): "LIPASE", "AMYLASE" in the last 168 hours. No results for input(s): "AMMONIA" in the last 168 hours. Coagulation Profile: Recent Labs  Lab 11/02/22 1121  INR 1.2    Cardiac Enzymes: No results for input(s): "CKTOTAL", "CKMB", "CKMBINDEX", "TROPONINI" in the last 168 hours. BNP (last 3 results) No results for input(s): "PROBNP" in the last 8760 hours. HbA1C: No results for input(s): "HGBA1C" in the last 72 hours. CBG: Recent Labs  Lab 11/02/22 1216 11/02/22 1339 11/02/22 1626  GLUCAP 385* 265* 272*   Lipid Profile: No results for input(s): "CHOL", "HDL", "LDLCALC", "TRIG", "CHOLHDL", "LDLDIRECT" in the last 72 hours. Thyroid Function Tests: No results for input(s): "TSH", "T4TOTAL", "FREET4", "T3FREE", "THYROIDAB" in the last 72 hours. Anemia Panel: No results for input(s): "VITAMINB12", "FOLATE", "FERRITIN", "TIBC", "IRON", "RETICCTPCT" in the last 72 hours. Urine analysis:    Component Value Date/Time   COLORURINE YELLOW (A) 01/25/2022 2132   APPEARANCEUR CLEAR (A) 01/25/2022 2132   APPEARANCEUR Cloudy (A) 01/13/2020 1156   LABSPEC 1.031 (H) 01/25/2022 2132   PHURINE 5.0 01/25/2022 2132   GLUCOSEU >=500 (A) 01/25/2022 2132   HGBUR NEGATIVE 01/25/2022 2132   BILIRUBINUR NEGATIVE 01/25/2022 2132   BILIRUBINUR Negative 01/13/2020 1156   KETONESUR 5 (A) 01/25/2022 2132   PROTEINUR 100 (A) 01/25/2022 2132   NITRITE NEGATIVE 01/25/2022 2132   LEUKOCYTESUR NEGATIVE 01/25/2022 2132   Sepsis Labs: @LABRCNTIP (procalcitonin:4,lacticidven:4) )No results found for this or any previous visit (from the past 240 hour(s)).   Radiological Exams on Admission: MR ANKLE RIGHT WO CONTRAST  Result Date: 11/02/2022 CLINICAL DATA:  Osteomyelitis suspected. Right foot wound and cellulitis. EXAM: MRI OF THE RIGHT ANKLE WITHOUT CONTRAST TECHNIQUE: Multiplanar, multisequence MR imaging of the ankle was performed. No intravenous contrast was administered. COMPARISON:  None Available. FINDINGS: TENDONS Peroneal: Peroneal longus tendon intact. Peroneal brevis intact. Fluid tracking along the peroneus brevis and peroneus longus tendon suggesting tenosynovitis.  Posteromedial: Posterior tibial tendon intact. Flexor hallucis longus tendon intact. Flexor digitorum longus tendon intact. Anterior: Tibialis anterior tendon intact. Extensor hallucis longus tendon intact Extensor  digitorum longus tendon intact. Achilles: Mild edema along the distal Achilles tendon suggesting peritenonitis. Achilles tendon is however intact. Plantar Fascia: Intact. LIGAMENTS Lateral: Anterior talofibular ligament intact. Calcaneofibular ligament intact. Posterior talofibular ligament intact. Anterior and posterior tibiofibular ligaments intact. Medial: Deltoid ligament intact. Spring ligament intact. CARTILAGE Ankle Joint: No joint effusion. Normal ankle mortise. No chondral defect. Subtalar Joints/Sinus Tarsi: Normal subtalar joints. No subtalar joint effusion. Normal sinus tarsi. Evaluation of ankle and subtalar joints is limited due to motion. Bones: Bone marrow edema of the lateral aspect of the calcaneus consistent with osteomyelitis. No evidence of fracture or dislocation. Soft Tissue: Large deep skin wound about the lateral aspect of the calcaneus measuring at least 3.5 x 1.0 x 2.4 cm. Marked soft tissue swelling about the medial and lateral aspect of the ankle. IMPRESSION: 1. Large deep skin wound about the lateral aspect of the calcaneus measuring at least 3.5 x 1.0 x 2.4 cm. 2. Bone marrow edema of the lateral aspect of the calcaneus adjacent to the above-mentioned deep skin wound consistent with osteomyelitis. 3. Fluid tracking along the peroneus brevis and peroneus longus tendon suggesting tenosynovitis. 4. Mild edema along the distal Achilles tendon suggesting peritenonitis, likely secondary to adjacent infectious/inflammatory process. 5. Evaluation of ankle and subtalar joints is limited due to motion. Electronically Signed   By: Larose Hires D.O.   On: 11/02/2022 14:38   DG Foot 2 Views Left  Result Date: 11/02/2022 CLINICAL DATA:  Diabetic foot infection.  Bilateral feet wounds.  EXAM: LEFT FOOT - 2 VIEW; RIGHT FOOT - 2 VIEW COMPARISON:  None Available. FINDINGS: Right foot: There is no evidence of fracture or dislocation. No cortical erosion or periosteal reaction. Osseous deformity of the second metatarsal head, which may be sequela of prior trauma or infectious/inflammatory process. Soft tissue swelling about the heel and lateral aspect of the ankle. Left foot: Postsurgical changes for prior amputation through the distal first metatarsal and second metatarsophalangeal joint. No cortical erosion or periosteal reaction. Soft tissue swelling about the prior amputation site. IMPRESSION: RIGHT FOOT: No cortical erosion or periosteal reaction to suggest osteomyelitis. Soft tissue swelling about the heel and lateral aspect of the ankle. LEFT FOOT: Postsurgical changes for prior amputation through the distal first metatarsal and second metatarsophalangeal joint without evidence of osteomyelitis. Electronically Signed   By: Larose Hires D.O.   On: 11/02/2022 13:26   DG Foot 2 Views Right  Result Date: 11/02/2022 CLINICAL DATA:  Diabetic foot infection.  Bilateral feet wounds. EXAM: LEFT FOOT - 2 VIEW; RIGHT FOOT - 2 VIEW COMPARISON:  None Available. FINDINGS: Right foot: There is no evidence of fracture or dislocation. No cortical erosion or periosteal reaction. Osseous deformity of the second metatarsal head, which may be sequela of prior trauma or infectious/inflammatory process. Soft tissue swelling about the heel and lateral aspect of the ankle. Left foot: Postsurgical changes for prior amputation through the distal first metatarsal and second metatarsophalangeal joint. No cortical erosion or periosteal reaction. Soft tissue swelling about the prior amputation site. IMPRESSION: RIGHT FOOT: No cortical erosion or periosteal reaction to suggest osteomyelitis. Soft tissue swelling about the heel and lateral aspect of the ankle. LEFT FOOT: Postsurgical changes for prior amputation through the  distal first metatarsal and second metatarsophalangeal joint without evidence of osteomyelitis. Electronically Signed   By: Larose Hires D.O.   On: 11/02/2022 13:26      Assessment/Plan Principal Problem:   Osteomyelitis of right foot (HCC) Active Problems:   Sepsis (HCC)  Type 2 diabetes mellitus with diabetic neuropathy, unspecified (HCC)   Essential hypertension   Chronic diastolic CHF (congestive heart failure) (HCC)   COPD (chronic obstructive pulmonary disease) (HCC)   HLD (hyperlipidemia)   DVT (deep venous thrombosis) (HCC)   Current smoker   Depression with anxiety   PAD (peripheral artery disease) (HCC)   Assessment and Plan:   Sepsis due to osteomyelitis of right foot [facility]): Patient meets criteria for sepsis with WBC 14.8 and, heart rate 99, lactic acid normal 1.3.  - will admit to tele bed as inpatient - Empiric antimicrobial treatment with vancomycin, Flagyl, Rocephin - PRN Zofran for nausea, tylenol, Percocet for pain - Blood cultures x 2  - ESR and CRP - wound care consult - will get Procalcitonin - IVF: 2.0 L of NS bolus in ED, followed by 75 cc/h - INR/PTT/type & screen - Please consult ortho in AM -Consulted Dr. Ether Griffins of podiatry and Dr. Wyn Quaker of [facility]  Type 2 diabetes mellitus with diabetic neuropathy, unspecified (HCC): Patient has blood sugar 437, anion gap 17, hydroxybutyric acid 2.55, but bicarbonate is 22, pH 7.43 on VBG, patient may be on early stage of developing DKA, I do not think patient needs insulin drip for now.  Will treat with IV fluid and SSI and glargine insulin -SSI -Glargine insulin 42 units daily -IV fluid as above  Essential hypertension -IV hydralazine as needed -Hold amlodipine and metoprolol since patient at risk of developing hypotension due to sepsis -Continue home clonidine  Chronic diastolic CHF (congestive heart failure) (HCC): 2D echo on 06/07/2021 showed EF of 55 to 60% with grade 1 diastolic dysfunction.  Patient does not  have leg edema JVD.  CHF is compensated.  BNP normal 72.6 -Watch volume status closely  COPD (chronic obstructive pulmonary disease) (HCC): Stable -Bronchodilators  HLD (hyperlipidemia) -Crestor  DVT (deep venous thrombosis) (HCC) -Switched Eliquis to IV heparin due to possible surgery  Current smoker -Nicotine patch  Depression with anxiety -Continue home medications  PAD (peripheral artery disease) (HCC) -Follow-up with Dr. Driscilla Grammes recommendation       DVT ppx: on IV Heparin  Code Status: Full code  Family Communication: not done, no family member is at bed side.    Disposition Plan:  Anticipate discharge back to previous environment  Consults called:  Dr. Ether Griffins of podiatry, Dr. Wyn Quaker of [facility] are consulted.  Admission status and Level of care: Telemetry Medical:    as inpt       Dispo: The patient is from: Home              Anticipated d/c is to: Home              Anticipated d/c date is: 2 days              Patient currently is not medically stable to d/c.    Severity of Illness:  The appropriate patient status for this patient is INPATIENT. Inpatient status is judged to be reasonable and necessary in order to provide the required intensity of service to ensure the patient's safety. The patient's presenting symptoms, physical exam findings, and initial radiographic and laboratory data in the context of their chronic comorbidities is felt to place them at high risk for further clinical deterioration. Furthermore, it is not anticipated that the patient will be medically stable for discharge from the hospital within 2 midnights of admission.   * I certify that at the point of admission it is my clinical judgment  that the patient will require inpatient hospital care spanning beyond 2 midnights from the point of admission due to high intensity of service, high risk for further deterioration and high frequency of surveillance required.*       Date of Service  11/02/2022    Lorretta Harp [facility]   If 7PM-7AM, please contact night-coverage [email or web address] 11/02/2022, 6:09 PM

## 2022-11-02 NOTE — ED Notes (Signed)
See triage note, pt reports infection to bilateral feet for the past 2 months. +diabetic.  Sent for admission. Alert and oriented.

## 2022-11-03 DIAGNOSIS — Z86718 Personal history of other venous thrombosis and embolism: Secondary | ICD-10-CM

## 2022-11-03 DIAGNOSIS — A419 Sepsis, unspecified organism: Secondary | ICD-10-CM | POA: Diagnosis not present

## 2022-11-03 DIAGNOSIS — I10 Essential (primary) hypertension: Secondary | ICD-10-CM

## 2022-11-03 DIAGNOSIS — J449 Chronic obstructive pulmonary disease, unspecified: Secondary | ICD-10-CM | POA: Diagnosis not present

## 2022-11-03 DIAGNOSIS — E782 Mixed hyperlipidemia: Secondary | ICD-10-CM

## 2022-11-03 DIAGNOSIS — M868X7 Other osteomyelitis, ankle and foot: Secondary | ICD-10-CM | POA: Diagnosis not present

## 2022-11-03 DIAGNOSIS — E1140 Type 2 diabetes mellitus with diabetic neuropathy, unspecified: Secondary | ICD-10-CM | POA: Diagnosis not present

## 2022-11-03 DIAGNOSIS — L8990 Pressure ulcer of unspecified site, unspecified stage: Secondary | ICD-10-CM | POA: Insufficient documentation

## 2022-11-03 LAB — GLUCOSE, CAPILLARY
Glucose-Capillary: 291 mg/dL — ABNORMAL HIGH (ref 70–99)
Glucose-Capillary: 178 mg/dL — ABNORMAL HIGH (ref 70–99)
Glucose-Capillary: 194 mg/dL — ABNORMAL HIGH (ref 70–99)
Glucose-Capillary: 238 mg/dL — ABNORMAL HIGH (ref 70–99)

## 2022-11-03 LAB — CBC
HCT: 29.6 % — ABNORMAL LOW (ref 39.0–52.0)
Hemoglobin: 9.7 g/dL — ABNORMAL LOW (ref 13.0–17.0)
MCH: 28.4 pg (ref 26.0–34.0)
MCHC: 32.8 g/dL (ref 30.0–36.0)
MCV: 86.8 fL (ref 80.0–100.0)
Platelets: 318 K/uL (ref 150–400)
RBC: 3.41 MIL/uL — ABNORMAL LOW (ref 4.22–5.81)
RDW: 12.8 % (ref 11.5–15.5)
WBC: 12.1 K/uL — ABNORMAL HIGH (ref 4.0–10.5)
nRBC: 0 % (ref 0.0–0.2)

## 2022-11-03 LAB — BASIC METABOLIC PANEL WITH GFR
Anion gap: 8 (ref 5–15)
BUN: 11 mg/dL (ref 6–20)
CO2: 24 mmol/L (ref 22–32)
Calcium: 7.6 mg/dL — ABNORMAL LOW (ref 8.9–10.3)
Chloride: 98 mmol/L (ref 98–111)
Creatinine, Ser: 0.75 mg/dL (ref 0.61–1.24)
GFR, Estimated: >60 mL/min
Glucose, Bld: 231 mg/dL — ABNORMAL HIGH (ref 70–99)
Potassium: 3.3 mmol/L — ABNORMAL LOW (ref 3.5–5.1)
Sodium: 130 mmol/L — ABNORMAL LOW (ref 135–145)

## 2022-11-03 LAB — APTT
aPTT: 123 seconds — ABNORMAL HIGH (ref 24–36)
aPTT: 105 seconds — ABNORMAL HIGH (ref 24–36)
aPTT: 106 seconds — ABNORMAL HIGH (ref 24–36)

## 2022-11-03 LAB — HIV ANTIBODY (ROUTINE TESTING W REFLEX): HIV Screen 4th Generation wRfx: NONREACTIVE

## 2022-11-03 LAB — CULTURE, BLOOD (ROUTINE X 2)

## 2022-11-03 LAB — C-REACTIVE PROTEIN: CRP: 29.2 mg/dL — ABNORMAL HIGH (ref <1.0)

## 2022-11-03 MED ORDER — INSULIN ASPART 100 UNIT/ML IJ SOLN
5.0000 [IU] | Freq: Three times a day (TID) | INTRAMUSCULAR | Status: DC
  Administered 2022-11-03 – 2022-11-04 (×4): 5 [IU] via SUBCUTANEOUS
  Filled 2022-11-03 (×4): qty 1

## 2022-11-03 NOTE — Progress Notes (Signed)
ANTICOAGULATION CONSULT NOTE  Pharmacy Consult for heparin innfusion Indication: h/o DVT  Allergies  Allergen Reactions   Bee Venom Anaphylaxis   Cheese Anaphylaxis, Swelling and Other (See Comments)    Blue Cheese only   Coffea Arabica Shortness Of Breath   Coffee Flavor Shortness Of Breath    Patient Measurements: Height: 5\' 10"  (177.8 cm) Weight: 89.4 kg (197 lb 1.5 oz) IBW/kg (Calculated) : 73 Heparin Dosing Weight: 89.4 kg  Vital Signs: Temp: 98.7 F (37.1 C) (05/18 0451) Temp Source: Oral (05/18 0451) BP: 129/76 (05/18 0451) Pulse Rate: 97 (05/18 0451)  Labs: Recent Labs    11/02/22 1121 11/02/22 1825 11/02/22 2045 11/03/22 0516  HGB 10.9*  --   --  9.7*  HCT 33.5*  --   --  29.6*  PLT 436*  --   --  318  APTT  --  116* 149* 123*  LABPROT 15.3* 16.7*  --   --   INR 1.2 1.3*  --   --   CREATININE 1.05  --   --  0.75    Estimated Creatinine Clearance: 110.6 mL/min (by C-G formula based on SCr of 0.75 mg/dL).  Medical History: Past Medical History:  Diagnosis Date   Anxiety    COPD (chronic obstructive pulmonary disease) (HCC)    Depression    Diabetes mellitus without complication (HCC)    GERD (gastroesophageal reflux disease)    Hypercholesteremia    Hypertension    Pneumonia    PTSD (post-traumatic stress disorder)     Assessment: 62 yr old man admitted with empyema, S/P VATs 2/28 on apixaban PTA. Pharmacy was consulted to dose IV heparin as we transition for a surgical procedure on 05/18  Results: Date/time aPTT/HL Comments 5/17@2045  aPTT=149 Supra-therapeutic@ 3000 un/hr > 2600 un/hr 5/18 0516 aPTT 123 Supratherapeutic  Goal of Therapy:  Heparin level 0.3-0.7 units/ml aPTT 66 - 102 seconds Monitor platelets by anticoagulation protocol: Yes   Plan:  Decrease infusion rate to  2400 units/hr. Recheck aPTT level in 6 hours after rate change and daily while on heparin (we will use aPTT to guide therapy until aPTT and anti-Xa level  correlate) Continue to monitor H&H and platelets  Otelia Sergeant, PharmD, [facility] 11/03/2022 6:07 AM

## 2022-11-03 NOTE — Consult Note (Signed)
WOC Nurse Consult Note: Reason for Consult:Full thickness wounds to right heel and left plantar foot., Podiatric medicine and vascular surgery have been simultaneous consulted. Dr. Ether Griffins (Podiatry) has provided guidance for the left plantar foot wound and I will transcribe them for Nursing via the Orders. Vascular surgery has ordered an arteriogram with possible intervention on Monday. I will provide conservative wound care guidance for the right heel for Nursing in the interim. Wound type:PVD, infection Pressure Injury POA: N/A Measurement:Per Nursing Flow Sheet Right heel:  9cm x 8cm Leff plantar foot: 1.5cm x 1cm Wound bed:See photodocumentation provided to EMR by Provider Drainage (amount, consistency, odor) small to moderate serosanguinous  Periwound: erythematous  Conservative therapy with soap and water cleansing is to begin today with application of a silicone foam per Dr. Earney Mallet request to the left foot. The right heel will be painted with a povidone iodine swabstick and allowed to air dry, then dressed with dry dressings.Pressure redistribution to the right heel will be via Prevalon boot.  Any orders from vascular surgery or podiatric medicine will supercede mine.  WOC nursing team will not follow, but will remain available to this patient, the nursing and medical teams.  Please re-consult if needed.  Thank you for inviting [place] to participate in this patient's Plan of Care.  Ladona Mow, MSN, RN, CNS, GNP, Leda Min, [employer], [employer] phone:  [phone]

## 2022-11-03 NOTE — Progress Notes (Signed)
ANTICOAGULATION CONSULT NOTE  Pharmacy Consult for heparin innfusion Indication: h/o DVT  Allergies  Allergen Reactions   Bee Venom Anaphylaxis   Cheese Anaphylaxis, Swelling and Other (See Comments)    Blue Cheese only   Coffea Arabica Shortness Of Breath   Coffee Flavor Shortness Of Breath    Patient Measurements: Height: 5\' 10"  (177.8 cm) Weight: 89.4 kg (197 lb 1.5 oz) IBW/kg (Calculated) : 73 Heparin Dosing Weight: 89.4 kg  Vital Signs: Temp: 99 F (37.2 C) (05/18 0814) Temp Source: Oral (05/18 0451) BP: 131/71 (05/18 0814) Pulse Rate: 82 (05/18 0814)  Labs: Recent Labs    11/02/22 1121 11/02/22 1825 11/02/22 1825 11/02/22 2045 11/03/22 0516 11/03/22 1458  HGB 10.9*  --   --   --  9.7*  --   HCT 33.5*  --   --   --  29.6*  --   PLT 436*  --   --   --  318  --   APTT  --  116*   < > 149* 123* 105*  LABPROT 15.3* 16.7*  --   --   --   --   INR 1.2 1.3*  --   --   --   --   CREATININE 1.05  --   --   --  0.75  --    < > = values in this interval not displayed.    Estimated Creatinine Clearance: 110.6 mL/min (by C-G formula based on SCr of 0.75 mg/dL).  Medical History: Past Medical History:  Diagnosis Date   Anxiety    COPD (chronic obstructive pulmonary disease) (HCC)    Depression    Diabetes mellitus without complication (HCC)    GERD (gastroesophageal reflux disease)    Hypercholesteremia    Hypertension    Pneumonia    PTSD (post-traumatic stress disorder)     Assessment: 62 yr old man admitted with empyema, S/P VATs 2/28 on apixaban PTA. Pharmacy was consulted to dose IV heparin as we transition for a surgical procedure on 05/18  Results: Date/time aPTT/HL Comments 5/17@2045  aPTT=149 Supra-therapeutic@ 3000 un/hr > 2600 un/hr 5/18 0516 aPTT 123 Supratherapeutic 5/18 1458 aPTT 105 Supratherapeutic  Goal of Therapy:  Heparin level 0.3-0.7 units/ml aPTT 66 - 102 seconds Monitor platelets by anticoagulation protocol: Yes   Plan:   Decrease heparin infusion rate to 2350 units/hr. Recheck aPTT level in 6 hours after rate change and daily while on heparin (we will use aPTT to guide therapy until aPTT and anti-Xa level correlate) Continue to monitor H&H and platelets  Barrie Folk, PharmD 11/03/2022 3:23 PM

## 2022-11-03 NOTE — Progress Notes (Signed)
ANTICOAGULATION CONSULT NOTE  Pharmacy Consult for heparin innfusion Indication: h/o DVT  Allergies  Allergen Reactions   Bee Venom Anaphylaxis   Cheese Anaphylaxis, Swelling and Other (See Comments)    Blue Cheese only   Coffea Arabica Shortness Of Breath   Coffee Flavor Shortness Of Breath    Patient Measurements: Height: 5\' 10"  (177.8 cm) Weight: 89.4 kg (197 lb 1.5 oz) IBW/kg (Calculated) : 73 Heparin Dosing Weight: 89.4 kg  Vital Signs: Temp: 99.7 F (37.6 C) (05/18 2002) Temp Source: Oral (05/18 2002) BP: 134/71 (05/18 2002) Pulse Rate: 90 (05/18 2002)  Labs: Recent Labs    11/02/22 1121 11/02/22 1825 11/02/22 2045 11/03/22 0516 11/03/22 1458 11/03/22 2202  HGB 10.9*  --   --  9.7*  --   --   HCT 33.5*  --   --  29.6*  --   --   PLT 436*  --   --  318  --   --   APTT  --  116*   < > 123* 105* 106*  LABPROT 15.3* 16.7*  --   --   --   --   INR 1.2 1.3*  --   --   --   --   CREATININE 1.05  --   --  0.75  --   --    < > = values in this interval not displayed.    Estimated Creatinine Clearance: 110.6 mL/min (by C-G formula based on SCr of 0.75 mg/dL).  Medical History: Past Medical History:  Diagnosis Date   Anxiety    COPD (chronic obstructive pulmonary disease) (HCC)    Depression    Diabetes mellitus without complication (HCC)    GERD (gastroesophageal reflux disease)    Hypercholesteremia    Hypertension    Pneumonia    PTSD (post-traumatic stress disorder)     Assessment: 62 yr old man admitted with empyema, S/P VATs 2/28 on apixaban PTA. Pharmacy was consulted to dose IV heparin as we transition for a surgical procedure on 05/18  Results: Date/time aPTT/HL Comments 5/17@2045  aPTT=149 Supra-therapeutic@ 3000 un/hr > 2600 un/hr 5/18 0516 aPTT 123 Supratherapeutic 5/18 1458 aPTT 105 Supratherapeutic 5/18 2202 aPTT 106 Supratherapeutic  Goal of Therapy:  Heparin level 0.3-0.7 units/ml aPTT 66 - 102 seconds Monitor platelets by  anticoagulation protocol: Yes   Plan:  Decrease heparin infusion rate to 2250 units/hr. Recheck aPTT level in 6 hours after rate change and daily while on heparin (we will use aPTT to guide therapy until aPTT and anti-Xa level correlate) Continue to monitor H&H and platelets  Otelia Sergeant, PharmD, [facility] 11/03/2022 11:00 PM

## 2022-11-03 NOTE — Progress Notes (Addendum)
PROGRESS NOTE    Timothy [name].   XBJ:[id number] DOB: Nov 24, 1960  DOA: 11/02/2022 Date of Service: 11/03/22 PCP: Nira Retort     Brief Narrative / Hospital Course:  Timothy Mckaskle. is a 62 y.o. male with medical history significant of hypertension, hyperlipidemia, diabetes mellitus, COPD on 2-3 L oxygen (2 L in the daytime, and 3L in night), diastolic CHF, depression with anxiety, PTSD, bipolar, DVT on Eliquis, who presents from podiatry office with bilateral foot ulcer and pain, unresponsive to outpatient abx.  Patient has a large and deep ulcer in right heel  and a small ulcer in the left plantar area.  05/17: MRI (+) osteomyelitis R calcaneus. Podiatry recs for BKA> Vasc surgery recs for angio on Monday.  05/18 (Saturday): stable, Glc improving.   Consultants:  Podiatry Vascular Surgery   Procedures: None       ASSESSMENT & PLAN:   Principal Problem:   Osteomyelitis of right foot (HCC) Active Problems:   Sepsis (HCC)   Type 2 diabetes mellitus with diabetic neuropathy, unspecified (HCC)   Essential hypertension   Chronic diastolic CHF (congestive heart failure) (HCC)   COPD (chronic obstructive pulmonary disease) (HCC)   HLD (hyperlipidemia)   DVT (deep venous thrombosis) (HCC)   Current smoker   Depression with anxiety   PAD (peripheral artery disease) (HCC)  Sepsis due to osteomyelitis of right calcaneus and R diabetic ulcer present on admission  L foot stage 3 diabetic/pressure ulcer present on admission Patient meets criteria for sepsis with WBC 14.8 and, heart rate 99, lactic acid normal 1.3. vancomycin, Flagyl, Rocephin PRN Zofran for nausea tylenol, Percocet for pain Blood cultures x 2 wound care consult Podiatry consult --> advise BKA w/ vascular surgery -->  per vascular plan on Monday, 11/05/2022 for right lower extremity angiogram with possible intervention.    Type 2 diabetes mellitus with diabetic neuropathy, unspecified [facility]):  Patient has blood sugar 437, anion gap 17, hydroxybutyric acid 2.55, but bicarbonate is 22, pH 7.43 on VBG, patient may be on early stage of developing DKA on admission SSI Glargine insulin 42 units daily   Essential hypertension IV hydralazine as needed Hold amlodipine and metoprolol since patient at risk of developing hypotension due to sepsis Continue home clonidine   Chronic diastolic CHF (congestive heart failure) (HCC):  2D echo on 06/07/2021 showed EF of 55 to 60% with grade 1 diastolic dysfunction.  Patient does not have leg edema JVD.  CHF is compensated.  BNP normal 72.6 Watch volume status closely   COPD (chronic obstructive pulmonary disease) (HCC): Stable Bronchodilators   HLD (hyperlipidemia) Crestor   History of DVT (deep venous thrombosis) (HCC) Switched Eliquis to IV heparin due to possible surgery   Current smoker Nicotine patch   Depression with anxiety Continue home medications   PAD (peripheral artery disease) (HCC) plan for Monday, 11/05/2022 RLE angiogram with possible intervention.    DVT prophylaxis: heparin  Pertinent IV fluids/nutrition: d/c IVF, encourage po intake  Central lines / invasive devices: none  Code Status: FULL CODE ACP documentation reviewed: 11/03/22 none on file VYNCA  Current Admission Status: inpatient   TOC needs / Dispo plan: pend surgical intervention likely will need SNF Barriers to discharge / significant pending items: angio Monday, pending likely amputation, expect will be here into lat next week             Subjective / Brief ROS:  Patient reports no concerns  Denies CP/SOB.  Pain controlled.  Denies  new weakness.  Tolerating diet.  Reports no concerns w/ urination/defecation.   Family Communication: pt declined call to support person(s)    Objective Findings:  Vitals:   11/02/22 1819 11/02/22 1937 11/03/22 0451 11/03/22 0814  BP: (!) 141/83 (!) 146/81 129/76 131/71  Pulse: 90 96 97 82  Resp:  17 20 16 16   Temp: 98.7 F (37.1 C) 98.4 F (36.9 C) 98.7 F (37.1 C) 99 F (37.2 C)  TempSrc:  Oral Oral   SpO2: 98% 97% 91% 99%  Weight:      Height:        Intake/Output Summary (Last 24 hours) at 11/03/2022 1138 Last data filed at 11/03/2022 1610 Gross per 24 hour  Intake 2100.78 ml  Output 1000 ml  Net 1100.78 ml   Filed Weights   11/02/22 1103  Weight: 89.4 kg    Examination:  Physical Exam Constitutional:      General: He is not in acute distress.    Appearance: Normal appearance.  Cardiovascular:     Rate and Rhythm: Normal rate and regular rhythm.  Pulmonary:     Effort: Pulmonary effort is normal.     Breath sounds: Normal breath sounds.  Abdominal:     General: Abdomen is flat.     Palpations: Abdomen is soft.  Skin:    General: Skin is warm and dry.     Comments: Bandages to lower extremities were not removed   Neurological:     General: No focal deficit present.     Mental Status: He is alert. Mental status is at baseline.  Psychiatric:        Mood and Affect: Mood normal.        Behavior: Behavior normal.          Scheduled Medications:   ARIPiprazole  10 mg Oral Daily   buPROPion  150 mg Oral BID   cloNIDine  0.1 mg Oral QHS   cyanocobalamin  1,000 mcg Oral Daily   gabapentin  400 mg Oral TID   insulin aspart  0-5 Units Subcutaneous QHS   insulin aspart  0-9 Units Subcutaneous TID WC   insulin aspart  5 Units Subcutaneous TID WC   insulin glargine-yfgn  42 Units Subcutaneous Daily   magnesium oxide  800 mg Oral BID   mometasone-formoterol  2 puff Inhalation BID   pantoprazole  40 mg Oral Daily   rosuvastatin  5 mg Oral QHS   umeclidinium bromide  1 puff Inhalation Daily   venlafaxine XR  225 mg Oral Daily    Continuous Infusions:  cefTRIAXone (ROCEPHIN)  IV Stopped (11/02/22 1516)   heparin 2,400 Units/hr (11/03/22 1054)   metronidazole Stopped (11/03/22 0139)   vancomycin 200 mL/hr at 11/03/22 0606    PRN Medications:   acetaminophen, albuterol, dextromethorphan-guaiFENesin, hydrALAZINE, ondansetron (ZOFRAN) IV, oxyCODONE-acetaminophen  Antimicrobials from admission:  Anti-infectives (From admission, onward)    Start     Dose/Rate Route Frequency Ordered Stop   11/03/22 0600  vancomycin (VANCOCIN) IVPB 1000 mg/200 mL premix        1,000 mg 200 mL/hr over 60 Minutes Intravenous Every 12 hours 11/02/22 1316     11/02/22 1330  vancomycin (VANCOREADY) IVPB 750 mg/150 mL        750 mg 150 mL/hr over 60 Minutes Intravenous  Once 11/02/22 1316 11/02/22 1750   11/02/22 1300  cefTRIAXone (ROCEPHIN) 2 g in sodium chloride 0.9 % 100 mL IVPB        2 g  200 mL/hr over 30 Minutes Intravenous Every 24 hours 11/02/22 1255     11/02/22 1300  metroNIDAZOLE (FLAGYL) IVPB 500 mg        500 mg 100 mL/hr over 60 Minutes Intravenous Every 12 hours 11/02/22 1255     11/02/22 1200  vancomycin (VANCOCIN) IVPB 1000 mg/200 mL premix        1,000 mg 200 mL/hr over 60 Minutes Intravenous  Once 11/02/22 1150 11/02/22 1333           Data Reviewed:  I have personally reviewed the following...  CBC: Recent Labs  Lab 11/02/22 1121 11/03/22 0516  WBC 14.8* 12.1*  NEUTROABS 12.3*  --   HGB 10.9* 9.7*  HCT 33.5* 29.6*  MCV 87.9 86.8  PLT 436* 318   Basic Metabolic Panel: Recent Labs  Lab 11/02/22 1121 11/03/22 0516  NA 127* 130*  K 4.3 3.3*  CL 88* 98  CO2 22 24  GLUCOSE 437* 231*  BUN 20 11  CREATININE 1.05 0.75  CALCIUM 8.7* 7.6*   GFR: Estimated Creatinine Clearance: 110.6 mL/min (by C-G formula based on SCr of 0.75 mg/dL). Liver Function Tests: Recent Labs  Lab 11/02/22 1121  AST 16  ALT 13  ALKPHOS 163*  BILITOT 1.0  PROT 7.7  ALBUMIN 2.8*   No results for input(s): "LIPASE", "AMYLASE" in the last 168 hours. No results for input(s): "AMMONIA" in the last 168 hours. Coagulation Profile: Recent Labs  Lab 11/02/22 1121 11/02/22 1825  INR 1.2 1.3*   Cardiac Enzymes: No results for  input(s): "CKTOTAL", "CKMB", "CKMBINDEX", "TROPONINI" in the last 168 hours. BNP (last 3 results) No results for input(s): "PROBNP" in the last 8760 hours. HbA1C: No results for input(s): "HGBA1C" in the last 72 hours. CBG: Recent Labs  Lab 11/02/22 1216 11/02/22 1339 11/02/22 1626 11/02/22 2105 11/03/22 0816  GLUCAP 385* 265* 272* 326* 291*   Lipid Profile: No results for input(s): "CHOL", "HDL", "LDLCALC", "TRIG", "CHOLHDL", "LDLDIRECT" in the last 72 hours. Thyroid Function Tests: No results for input(s): "TSH", "T4TOTAL", "FREET4", "T3FREE", "THYROIDAB" in the last 72 hours. Anemia Panel: No results for input(s): "VITAMINB12", "FOLATE", "FERRITIN", "TIBC", "IRON", "RETICCTPCT" in the last 72 hours. Most Recent Urinalysis On File:     Component Value Date/Time   COLORURINE YELLOW (A) 11/02/2022 1925   APPEARANCEUR CLEAR (A) 11/02/2022 1925   APPEARANCEUR Cloudy (A) 01/13/2020 1156   LABSPEC 1.023 11/02/2022 1925   PHURINE 5.0 11/02/2022 1925   GLUCOSEU >=500 (A) 11/02/2022 1925   HGBUR SMALL (A) 11/02/2022 1925   BILIRUBINUR NEGATIVE 11/02/2022 1925   BILIRUBINUR Negative 01/13/2020 1156   KETONESUR 5 (A) 11/02/2022 1925   PROTEINUR 30 (A) 11/02/2022 1925   NITRITE NEGATIVE 11/02/2022 1925   LEUKOCYTESUR NEGATIVE 11/02/2022 1925   Sepsis Labs: @LABRCNTIP (procalcitonin:4,lacticidven:4) Microbiology: Recent Results (from the past 240 hour(s))  Culture, blood (Routine x 2)     Status: None (Preliminary result)   Collection Time: 11/02/22 11:16 AM   Specimen: BLOOD  Result Value Ref Range Status   Specimen Description BLOOD RIGHT ANTECUBITAL  Final   Special Requests   Final    BOTTLES DRAWN AEROBIC AND ANAEROBIC Blood Culture adequate volume   Culture   Final    NO GROWTH < 24 HOURS Performed at [facility], [address] [place], [place] [address]    Report Status PENDING  Incomplete  Culture, blood (Routine x 2)     Status: None (Preliminary  result)   Collection Time: 11/02/22  11:21 AM   Specimen: BLOOD  Result Value Ref Range Status   Specimen Description BLOOD BLOOD RIGHT ARM  Final   Special Requests   Final    BOTTLES DRAWN AEROBIC AND ANAEROBIC Blood Culture adequate volume   Culture   Final    NO GROWTH < 24 HOURS Performed at [facility], [address] [place], [place] [address]    Report Status PENDING  Incomplete      Radiology Studies last 3 days: MR ANKLE RIGHT WO CONTRAST  Result Date: 11/02/2022 CLINICAL DATA:  Osteomyelitis suspected. Right foot wound and cellulitis. EXAM: MRI OF THE RIGHT ANKLE WITHOUT CONTRAST TECHNIQUE: Multiplanar, multisequence MR imaging of the ankle was performed. No intravenous contrast was administered. COMPARISON:  None Available. FINDINGS: TENDONS Peroneal: Peroneal longus tendon intact. Peroneal brevis intact. Fluid tracking along the peroneus brevis and peroneus longus tendon suggesting tenosynovitis. Posteromedial: Posterior tibial tendon intact. Flexor hallucis longus tendon intact. Flexor digitorum longus tendon intact. Anterior: Tibialis anterior tendon intact. Extensor hallucis longus tendon intact Extensor digitorum longus tendon intact. Achilles: Mild edema along the distal Achilles tendon suggesting peritenonitis. Achilles tendon is however intact. Plantar Fascia: Intact. LIGAMENTS Lateral: Anterior talofibular ligament intact. Calcaneofibular ligament intact. Posterior talofibular ligament intact. Anterior and posterior tibiofibular ligaments intact. Medial: Deltoid ligament intact. Spring ligament intact. CARTILAGE Ankle Joint: No joint effusion. Normal ankle mortise. No chondral defect. Subtalar Joints/Sinus Tarsi: Normal subtalar joints. No subtalar joint effusion. Normal sinus tarsi. Evaluation of ankle and subtalar joints is limited due to motion. Bones: Bone marrow edema of the lateral aspect of the calcaneus consistent with osteomyelitis. No evidence of fracture  or dislocation. Soft Tissue: Large deep skin wound about the lateral aspect of the calcaneus measuring at least 3.5 x 1.0 x 2.4 cm. Marked soft tissue swelling about the medial and lateral aspect of the ankle. IMPRESSION: 1. Large deep skin wound about the lateral aspect of the calcaneus measuring at least 3.5 x 1.0 x 2.4 cm. 2. Bone marrow edema of the lateral aspect of the calcaneus adjacent to the above-mentioned deep skin wound consistent with osteomyelitis. 3. Fluid tracking along the peroneus brevis and peroneus longus tendon suggesting tenosynovitis. 4. Mild edema along the distal Achilles tendon suggesting peritenonitis, likely secondary to adjacent infectious/inflammatory process. 5. Evaluation of ankle and subtalar joints is limited due to motion. Electronically Signed   By: Larose Hires D.O.   On: 11/02/2022 14:38   DG Foot 2 Views Left  Result Date: 11/02/2022 CLINICAL DATA:  Diabetic foot infection.  Bilateral feet wounds. EXAM: LEFT FOOT - 2 VIEW; RIGHT FOOT - 2 VIEW COMPARISON:  None Available. FINDINGS: Right foot: There is no evidence of fracture or dislocation. No cortical erosion or periosteal reaction. Osseous deformity of the second metatarsal head, which may be sequela of prior trauma or infectious/inflammatory process. Soft tissue swelling about the heel and lateral aspect of the ankle. Left foot: Postsurgical changes for prior amputation through the distal first metatarsal and second metatarsophalangeal joint. No cortical erosion or periosteal reaction. Soft tissue swelling about the prior amputation site. IMPRESSION: RIGHT FOOT: No cortical erosion or periosteal reaction to suggest osteomyelitis. Soft tissue swelling about the heel and lateral aspect of the ankle. LEFT FOOT: Postsurgical changes for prior amputation through the distal first metatarsal and second metatarsophalangeal joint without evidence of osteomyelitis. Electronically Signed   By: Larose Hires D.O.   On: 11/02/2022  13:26   DG Foot 2 Views Right  Result Date: 11/02/2022 CLINICAL  DATA:  Diabetic foot infection.  Bilateral feet wounds. EXAM: LEFT FOOT - 2 VIEW; RIGHT FOOT - 2 VIEW COMPARISON:  None Available. FINDINGS: Right foot: There is no evidence of fracture or dislocation. No cortical erosion or periosteal reaction. Osseous deformity of the second metatarsal head, which may be sequela of prior trauma or infectious/inflammatory process. Soft tissue swelling about the heel and lateral aspect of the ankle. Left foot: Postsurgical changes for prior amputation through the distal first metatarsal and second metatarsophalangeal joint. No cortical erosion or periosteal reaction. Soft tissue swelling about the prior amputation site. IMPRESSION: RIGHT FOOT: No cortical erosion or periosteal reaction to suggest osteomyelitis. Soft tissue swelling about the heel and lateral aspect of the ankle. LEFT FOOT: Postsurgical changes for prior amputation through the distal first metatarsal and second metatarsophalangeal joint without evidence of osteomyelitis. Electronically Signed   By: Larose Hires D.O.   On: 11/02/2022 13:26             LOS: 1 day    Sunnie Nielsen, DO [facility] 11/03/2022, 11:38 AM    Dictation software may have been used to generate the above note. Typos may occur and escape review in typed/dictated notes. Please contact Dr Lyn Hollingshead directly for clarity if needed.  Staff may message me via secure chat in Epic  but this may not receive an immediate response,  please page me for urgent matters!  If 7PM-7AM, please contact night coverage [email or web address]

## 2022-11-03 NOTE — Hospital Course (Addendum)
Timothy [name]. is a 61 y.o. male with medical history significant of hypertension, hyperlipidemia, diabetes mellitus, COPD on 2-3 L oxygen (2 L in the daytime, and 3L in night), diastolic CHF, depression with anxiety, PTSD, bipolar, DVT on Eliquis, who presents from podiatry office with bilateral foot ulcer and pain, unresponsive to outpatient abx.  Patient has a large and deep ulcer in right heel  and a small ulcer in the left plantar area.  05/17: MRI (+) osteomyelitis R calcaneus.  Patient had a right lower extremity angiogram with angioplasty followed by BKA on 5/21.  Antibiotics are completed.  Currently pending for placement.   6/5: Waiting for placement 6/6: Fell overnight.  No injuries.  Right knee x-ray negative for any acute changes

## 2022-11-04 DIAGNOSIS — J449 Chronic obstructive pulmonary disease, unspecified: Secondary | ICD-10-CM | POA: Diagnosis not present

## 2022-11-04 DIAGNOSIS — M868X7 Other osteomyelitis, ankle and foot: Secondary | ICD-10-CM | POA: Diagnosis not present

## 2022-11-04 DIAGNOSIS — E1140 Type 2 diabetes mellitus with diabetic neuropathy, unspecified: Secondary | ICD-10-CM | POA: Diagnosis not present

## 2022-11-04 DIAGNOSIS — A419 Sepsis, unspecified organism: Secondary | ICD-10-CM | POA: Diagnosis not present

## 2022-11-04 LAB — CBC
HCT: 27.1 % — ABNORMAL LOW (ref 39.0–52.0)
Hemoglobin: 8.9 g/dL — ABNORMAL LOW (ref 13.0–17.0)
MCH: 28.7 pg (ref 26.0–34.0)
MCHC: 32.8 g/dL (ref 30.0–36.0)
MCV: 87.4 fL (ref 80.0–100.0)
Platelets: 302 10*3/uL (ref 150–400)
RBC: 3.1 MIL/uL — ABNORMAL LOW (ref 4.22–5.81)
RDW: 12.7 % (ref 11.5–15.5)
WBC: 11.6 10*3/uL — ABNORMAL HIGH (ref 4.0–10.5)
nRBC: 0 % (ref 0.0–0.2)

## 2022-11-04 LAB — BASIC METABOLIC PANEL
Anion gap: 7 (ref 5–15)
BUN: 7 mg/dL (ref 6–20)
CO2: 27 mmol/L (ref 22–32)
Calcium: 7.5 mg/dL — ABNORMAL LOW (ref 8.9–10.3)
Chloride: 97 mmol/L — ABNORMAL LOW (ref 98–111)
Creatinine, Ser: 0.7 mg/dL (ref 0.61–1.24)
GFR, Estimated: >60 mL/min (ref >60)
Glucose, Bld: 179 mg/dL — ABNORMAL HIGH (ref 70–99)
Potassium: 3 mmol/L — ABNORMAL LOW (ref 3.5–5.1)
Sodium: 131 mmol/L — ABNORMAL LOW (ref 135–145)

## 2022-11-04 LAB — GLUCOSE, CAPILLARY
Glucose-Capillary: 201 mg/dL — ABNORMAL HIGH (ref 70–99)
Glucose-Capillary: 163 mg/dL — ABNORMAL HIGH (ref 70–99)
Glucose-Capillary: 190 mg/dL — ABNORMAL HIGH (ref 70–99)
Glucose-Capillary: 272 mg/dL — ABNORMAL HIGH (ref 70–99)

## 2022-11-04 LAB — APTT
aPTT: 85 seconds — ABNORMAL HIGH (ref 24–36)
aPTT: 75 seconds — ABNORMAL HIGH (ref 24–36)

## 2022-11-04 LAB — HEPARIN LEVEL (UNFRACTIONATED)
Heparin Unfractionated: 0.22 IU/mL — ABNORMAL LOW (ref 0.30–0.70)
Heparin Unfractionated: 0.17 IU/mL — ABNORMAL LOW (ref 0.30–0.70)

## 2022-11-04 LAB — CULTURE, BLOOD (ROUTINE X 2)

## 2022-11-04 MED ORDER — HEPARIN SODIUM (PORCINE) 5000 UNIT/ML IJ SOLN
5000.0000 [IU] | Freq: Three times a day (TID) | INTRAMUSCULAR | Status: DC
  Administered 2022-11-04 – 2022-11-24 (×59): 5000 [IU] via SUBCUTANEOUS
  Filled 2022-11-04 (×60): qty 1

## 2022-11-04 MED ORDER — POTASSIUM CHLORIDE CRYS ER 20 MEQ PO TBCR
40.0000 meq | EXTENDED_RELEASE_TABLET | Freq: Once | ORAL | Status: AC
  Administered 2022-11-04: 20 meq via ORAL
  Filled 2022-11-04: qty 2

## 2022-11-04 NOTE — Progress Notes (Signed)
ANTICOAGULATION CONSULT NOTE  Pharmacy Consult for heparin innfusion Indication: h/o DVT  Allergies  Allergen Reactions   Bee Venom Anaphylaxis   Cheese Anaphylaxis, Swelling and Other (See Comments)    Blue Cheese only   Coffea Arabica Shortness Of Breath   Coffee Flavor Shortness Of Breath    Patient Measurements: Height: 5\' 10"  (177.8 cm) Weight: 89.4 kg (197 lb 1.5 oz) IBW/kg (Calculated) : 73 Heparin Dosing Weight: 89.4 kg  Vital Signs: Temp: 98.6 F (37 C) (05/19 0454) Temp Source: Oral (05/19 0454) BP: 131/70 (05/19 0454) Pulse Rate: 88 (05/19 0454)  Labs: Recent Labs    11/02/22 1121 11/02/22 1825 11/02/22 2045 11/03/22 0516 11/03/22 1458 11/03/22 2202 11/04/22 0443  HGB 10.9*  --   --  9.7*  --   --  8.9*  HCT 33.5*  --   --  29.6*  --   --  27.1*  PLT 436*  --   --  318  --   --  302  APTT  --  116*   < > 123* 105* 106* 85*  LABPROT 15.3* 16.7*  --   --   --   --   --   INR 1.2 1.3*  --   --   --   --   --   HEPARINUNFRC  --   --   --   --   --   --  0.22*  CREATININE 1.05  --   --  0.75  --   --  0.70   < > = values in this interval not displayed.    Estimated Creatinine Clearance: 110.6 mL/min (by C-G formula based on SCr of 0.7 mg/dL).  Medical History: Past Medical History:  Diagnosis Date   Anxiety    COPD (chronic obstructive pulmonary disease) (HCC)    Depression    Diabetes mellitus without complication (HCC)    GERD (gastroesophageal reflux disease)    Hypercholesteremia    Hypertension    Pneumonia    PTSD (post-traumatic stress disorder)     Assessment: 62 yr old man admitted with empyema, S/P VATs 2/28 on apixaban PTA. Pharmacy was consulted to dose IV heparin as we transition for a surgical procedure on 05/18  Results: Date/time aPTT/HL Comments 5/17@2045  aPTT=149 Supra-therapeutic@ 3000 un/hr > 2600 un/hr 5/18 0516 aPTT 123 Supratherapeutic 5/18 1458 aPTT 105 Supratherapeutic 5/18 2202 aPTT 106 Supratherapeutic 5/19  0443 aPTT 85, therapeutic x 2 / HL 0.22, subtherapeutic  Goal of Therapy:  Heparin level 0.3-0.7 units/ml aPTT 66 - 102 seconds Monitor platelets by anticoagulation protocol: Yes   Plan:  Increase heparin infusion rate to 2350 units/hr. Recheck aPTT level in 6 hours after rate change and daily while on heparin (we will use aPTT to guide therapy until aPTT and anti-Xa level correlate) Recheck HL in 6 hrs to confirm correlation Continue to monitor H&H and platelets  Otelia Sergeant, PharmD, [facility] 11/04/2022 5:56 AM

## 2022-11-04 NOTE — Progress Notes (Signed)
PROGRESS NOTE    Timothy [name].   ZOX:[id number] DOB: 09-Jul-1960  DOA: 11/02/2022 Date of Service: 11/04/22 PCP: Nira Retort     Brief Narrative / Hospital Course:  Timothy [name]. is a 62 y.o. male with medical history significant of hypertension, hyperlipidemia, diabetes mellitus, COPD on 2-3 L oxygen (2 L in the daytime, and 3L in night), diastolic CHF, depression with anxiety, PTSD, bipolar, DVT on Eliquis, who presents from podiatry office with bilateral foot ulcer and pain, unresponsive to outpatient abx.  Patient has a large and deep ulcer in right heel  and a small ulcer in the left plantar area.  05/17: MRI (+) osteomyelitis R calcaneus. Podiatry recs for BKA. Vasc surgery planning for RLE angio on Monday.  05/18-05/19 (weekend): stable, Glc improving.   Consultants:  Podiatry Vascular Surgery   Procedures: None       ASSESSMENT & PLAN:   Principal Problem:   Osteomyelitis of right foot (HCC) Active Problems:   Sepsis (HCC)   Type 2 diabetes mellitus with diabetic neuropathy, unspecified (HCC)   Essential hypertension   Chronic diastolic CHF (congestive heart failure) (HCC)   COPD (chronic obstructive pulmonary disease) (HCC)   HLD (hyperlipidemia)   DVT (deep venous thrombosis) (HCC)   Current smoker   Depression with anxiety   PAD (peripheral artery disease) (HCC)  Sepsis due to osteomyelitis of right calcaneus and R diabetic ulcer present on admission  L foot stage 3 diabetic/pressure ulcer present on admission Patient meets criteria for sepsis with WBC 14.8 and, heart rate 99, lactic acid normal 1.3. vancomycin, Flagyl, Rocephin PRN Zofran for nausea tylenol, Percocet for pain Blood cultures x 2 wound care consult Podiatry consult --> advise BKA w/ vascular surgery -->  per vascular plan on Monday, 11/05/2022 for right lower extremity angiogram with possible intervention.    Type 2 diabetes mellitus with diabetic neuropathy,  unspecified [facility]): Patient has blood sugar 437, anion gap 17, hydroxybutyric acid 2.55, but bicarbonate is 22, pH 7.43 on VBG, patient may be on early stage of developing DKA on admission SSI Glargine insulin 42 units daily   Hypokalemia Replace as needed Monitor BMP  Essential hypertension IV hydralazine as needed Hold amlodipine and metoprolol since patient at risk of developing hypotension due to sepsis Continue home clonidine   Chronic diastolic CHF (congestive heart failure) (HCC):  2D echo on 06/07/2021 showed EF of 55 to 60% with grade 1 diastolic dysfunction.  Patient does not have leg edema JVD.  CHF is compensated.  BNP normal 72.6 Watch volume status closely   COPD (chronic obstructive pulmonary disease) (HCC): Stable Bronchodilators   HLD (hyperlipidemia) Crestor   History of DVT (deep venous thrombosis) (HCC) Switched Eliquis to heparin due to possible surgery   Current smoker Nicotine patch   Depression with anxiety Continue home medications   PAD (peripheral artery disease) (HCC) plan for Monday, 11/05/2022 RLE angiogram with possible intervention.    DVT prophylaxis: heparin  Pertinent IV fluids/nutrition: d/c IVF, encourage po intake  Central lines / invasive devices: none  Code Status: FULL CODE ACP documentation reviewed: 11/03/22 none on file VYNCA  Current Admission Status: inpatient   TOC needs / Dispo plan: pend surgical intervention likely will need SNF Barriers to discharge / significant pending items: angio Monday, pending likely amputation, expect will be here into lat next week             Subjective / Brief ROS:  Patient reports no concerns  Denies CP/SOB.  Pain controlled.    Family Communication: pt declined call to support person(s)    Objective Findings:  Vitals:   11/03/22 1643 11/03/22 2002 11/04/22 0454 11/04/22 0809  BP: 124/71 134/71 131/70 132/71  Pulse: 86 90 88 90  Resp: 17 16 16 19   Temp: (!) 100.5 F  (38.1 C) 99.7 F (37.6 C) 98.6 F (37 C) 98.9 F (37.2 C)  TempSrc:  Oral Oral Oral  SpO2: 96% 93% 93% 95%  Weight:      Height:        Intake/Output Summary (Last 24 hours) at 11/04/2022 1331 Last data filed at 11/04/2022 1020 Gross per 24 hour  Intake 1081.57 ml  Output 550 ml  Net 531.57 ml   Filed Weights   11/02/22 1103  Weight: 89.4 kg    Examination:  Physical Exam Constitutional:      General: He is not in acute distress.    Appearance: Normal appearance.  Cardiovascular:     Rate and Rhythm: Normal rate and regular rhythm.  Pulmonary:     Effort: Pulmonary effort is normal.     Breath sounds: Normal breath sounds.  Abdominal:     General: Abdomen is flat.     Palpations: Abdomen is soft.  Skin:    General: Skin is warm and dry.     Comments: Bandages to lower extremities were not removed   Neurological:     General: No focal deficit present.     Mental Status: He is alert. Mental status is at baseline.  Psychiatric:        Mood and Affect: Mood normal.        Behavior: Behavior normal.          Scheduled Medications:   ARIPiprazole  10 mg Oral Daily   buPROPion  150 mg Oral BID   cloNIDine  0.1 mg Oral QHS   cyanocobalamin  1,000 mcg Oral Daily   gabapentin  400 mg Oral TID   insulin aspart  0-5 Units Subcutaneous QHS   insulin aspart  0-9 Units Subcutaneous TID WC   insulin aspart  5 Units Subcutaneous TID WC   insulin glargine-yfgn  42 Units Subcutaneous Daily   magnesium oxide  800 mg Oral BID   mometasone-formoterol  2 puff Inhalation BID   pantoprazole  40 mg Oral Daily   rosuvastatin  5 mg Oral QHS   umeclidinium bromide  1 puff Inhalation Daily   venlafaxine XR  225 mg Oral Daily    Continuous Infusions:  cefTRIAXone (ROCEPHIN)  IV 2 g (11/04/22 1254)   heparin 2,350 Units/hr (11/04/22 0919)   metronidazole 500 mg (11/04/22 1251)   vancomycin 1,000 mg (11/04/22 0503)    PRN Medications:  acetaminophen, albuterol,  dextromethorphan-guaiFENesin, hydrALAZINE, ondansetron (ZOFRAN) IV, oxyCODONE-acetaminophen  Antimicrobials from admission:  Anti-infectives (From admission, onward)    Start     Dose/Rate Route Frequency Ordered Stop   11/03/22 0600  vancomycin (VANCOCIN) IVPB 1000 mg/200 mL premix        1,000 mg 200 mL/hr over 60 Minutes Intravenous Every 12 hours 11/02/22 1316     11/02/22 1330  vancomycin (VANCOREADY) IVPB 750 mg/150 mL        750 mg 150 mL/hr over 60 Minutes Intravenous  Once 11/02/22 1316 11/02/22 1750   11/02/22 1300  cefTRIAXone (ROCEPHIN) 2 g in sodium chloride 0.9 % 100 mL IVPB        2 g 200 mL/hr over 30 Minutes  Intravenous Every 24 hours 11/02/22 1255     11/02/22 1300  metroNIDAZOLE (FLAGYL) IVPB 500 mg        500 mg 100 mL/hr over 60 Minutes Intravenous Every 12 hours 11/02/22 1255     11/02/22 1200  vancomycin (VANCOCIN) IVPB 1000 mg/200 mL premix        1,000 mg 200 mL/hr over 60 Minutes Intravenous  Once 11/02/22 1150 11/02/22 1333           Data Reviewed:  I have personally reviewed the following...  CBC: Recent Labs  Lab 11/02/22 1121 11/03/22 0516 11/04/22 0443  WBC 14.8* 12.1* 11.6*  NEUTROABS 12.3*  --   --   HGB 10.9* 9.7* 8.9*  HCT 33.5* 29.6* 27.1*  MCV 87.9 86.8 87.4  PLT 436* 318 302   Basic Metabolic Panel: Recent Labs  Lab 11/02/22 1121 11/03/22 0516 11/04/22 0443  NA 127* 130* 131*  K 4.3 3.3* 3.0*  CL 88* 98 97*  CO2 22 24 27   GLUCOSE 437* 231* 179*  BUN 20 11 7   CREATININE 1.05 0.75 0.70  CALCIUM 8.7* 7.6* 7.5*   GFR: Estimated Creatinine Clearance: 110.6 mL/min (by C-G formula based on SCr of 0.7 mg/dL). Liver Function Tests: Recent Labs  Lab 11/02/22 1121  AST 16  ALT 13  ALKPHOS 163*  BILITOT 1.0  PROT 7.7  ALBUMIN 2.8*   No results for input(s): "LIPASE", "AMYLASE" in the last 168 hours. No results for input(s): "AMMONIA" in the last 168 hours. Coagulation Profile: Recent Labs  Lab 11/02/22 1121  11/02/22 1825  INR 1.2 1.3*   Cardiac Enzymes: No results for input(s): "CKTOTAL", "CKMB", "CKMBINDEX", "TROPONINI" in the last 168 hours. BNP (last 3 results) No results for input(s): "PROBNP" in the last 8760 hours. HbA1C: No results for input(s): "HGBA1C" in the last 72 hours. CBG: Recent Labs  Lab 11/03/22 1219 11/03/22 1724 11/03/22 2059 11/04/22 0812 11/04/22 1159  GLUCAP 178* 194* 238* 201* 163*   Lipid Profile: No results for input(s): "CHOL", "HDL", "LDLCALC", "TRIG", "CHOLHDL", "LDLDIRECT" in the last 72 hours. Thyroid Function Tests: No results for input(s): "TSH", "T4TOTAL", "FREET4", "T3FREE", "THYROIDAB" in the last 72 hours. Anemia Panel: No results for input(s): "VITAMINB12", "FOLATE", "FERRITIN", "TIBC", "IRON", "RETICCTPCT" in the last 72 hours. Most Recent Urinalysis On File:     Component Value Date/Time   COLORURINE YELLOW (A) 11/02/2022 1925   APPEARANCEUR CLEAR (A) 11/02/2022 1925   APPEARANCEUR Cloudy (A) 01/13/2020 1156   LABSPEC 1.023 11/02/2022 1925   PHURINE 5.0 11/02/2022 1925   GLUCOSEU >=500 (A) 11/02/2022 1925   HGBUR SMALL (A) 11/02/2022 1925   BILIRUBINUR NEGATIVE 11/02/2022 1925   BILIRUBINUR Negative 01/13/2020 1156   KETONESUR 5 (A) 11/02/2022 1925   PROTEINUR 30 (A) 11/02/2022 1925   NITRITE NEGATIVE 11/02/2022 1925   LEUKOCYTESUR NEGATIVE 11/02/2022 1925   Sepsis Labs: @LABRCNTIP (procalcitonin:4,lacticidven:4) Microbiology: Recent Results (from the past 240 hour(s))  Culture, blood (Routine x 2)     Status: None (Preliminary result)   Collection Time: 11/02/22 11:16 AM   Specimen: BLOOD  Result Value Ref Range Status   Specimen Description BLOOD RIGHT ANTECUBITAL  Final   Special Requests   Final    BOTTLES DRAWN AEROBIC AND ANAEROBIC Blood Culture adequate volume   Culture   Final    NO GROWTH 2 DAYS Performed at [facility], [address] [place], [place] [address]    Report Status PENDING  Incomplete   Culture, blood (Routine x  2)     Status: None (Preliminary result)   Collection Time: 11/02/22 11:21 AM   Specimen: BLOOD  Result Value Ref Range Status   Specimen Description BLOOD BLOOD RIGHT ARM  Final   Special Requests   Final    BOTTLES DRAWN AEROBIC AND ANAEROBIC Blood Culture adequate volume   Culture   Final    NO GROWTH 2 DAYS Performed at [facility], [address], [place], [place] [address]    Report Status PENDING  Incomplete      Radiology Studies last 3 days: MR ANKLE RIGHT WO CONTRAST  Result Date: 11/02/2022 CLINICAL DATA:  Osteomyelitis suspected. Right foot wound and cellulitis. EXAM: MRI OF THE RIGHT ANKLE WITHOUT CONTRAST TECHNIQUE: Multiplanar, multisequence MR imaging of the ankle was performed. No intravenous contrast was administered. COMPARISON:  None Available. FINDINGS: TENDONS Peroneal: Peroneal longus tendon intact. Peroneal brevis intact. Fluid tracking along the peroneus brevis and peroneus longus tendon suggesting tenosynovitis. Posteromedial: Posterior tibial tendon intact. Flexor hallucis longus tendon intact. Flexor digitorum longus tendon intact. Anterior: Tibialis anterior tendon intact. Extensor hallucis longus tendon intact Extensor digitorum longus tendon intact. Achilles: Mild edema along the distal Achilles tendon suggesting peritenonitis. Achilles tendon is however intact. Plantar Fascia: Intact. LIGAMENTS Lateral: Anterior talofibular ligament intact. Calcaneofibular ligament intact. Posterior talofibular ligament intact. Anterior and posterior tibiofibular ligaments intact. Medial: Deltoid ligament intact. Spring ligament intact. CARTILAGE Ankle Joint: No joint effusion. Normal ankle mortise. No chondral defect. Subtalar Joints/Sinus Tarsi: Normal subtalar joints. No subtalar joint effusion. Normal sinus tarsi. Evaluation of ankle and subtalar joints is limited due to motion. Bones: Bone marrow edema of the lateral aspect of the calcaneus  consistent with osteomyelitis. No evidence of fracture or dislocation. Soft Tissue: Large deep skin wound about the lateral aspect of the calcaneus measuring at least 3.5 x 1.0 x 2.4 cm. Marked soft tissue swelling about the medial and lateral aspect of the ankle. IMPRESSION: 1. Large deep skin wound about the lateral aspect of the calcaneus measuring at least 3.5 x 1.0 x 2.4 cm. 2. Bone marrow edema of the lateral aspect of the calcaneus adjacent to the above-mentioned deep skin wound consistent with osteomyelitis. 3. Fluid tracking along the peroneus brevis and peroneus longus tendon suggesting tenosynovitis. 4. Mild edema along the distal Achilles tendon suggesting peritenonitis, likely secondary to adjacent infectious/inflammatory process. 5. Evaluation of ankle and subtalar joints is limited due to motion. Electronically Signed   By: Larose Hires D.O.   On: 11/02/2022 14:38   DG Foot 2 Views Left  Result Date: 11/02/2022 CLINICAL DATA:  Diabetic foot infection.  Bilateral feet wounds. EXAM: LEFT FOOT - 2 VIEW; RIGHT FOOT - 2 VIEW COMPARISON:  None Available. FINDINGS: Right foot: There is no evidence of fracture or dislocation. No cortical erosion or periosteal reaction. Osseous deformity of the second metatarsal head, which may be sequela of prior trauma or infectious/inflammatory process. Soft tissue swelling about the heel and lateral aspect of the ankle. Left foot: Postsurgical changes for prior amputation through the distal first metatarsal and second metatarsophalangeal joint. No cortical erosion or periosteal reaction. Soft tissue swelling about the prior amputation site. IMPRESSION: RIGHT FOOT: No cortical erosion or periosteal reaction to suggest osteomyelitis. Soft tissue swelling about the heel and lateral aspect of the ankle. LEFT FOOT: Postsurgical changes for prior amputation through the distal first metatarsal and second metatarsophalangeal joint without evidence of osteomyelitis.  Electronically Signed   By: Larose Hires D.O.   On: 11/02/2022  13:26   DG Foot 2 Views Right  Result Date: 11/02/2022 CLINICAL DATA:  Diabetic foot infection.  Bilateral feet wounds. EXAM: LEFT FOOT - 2 VIEW; RIGHT FOOT - 2 VIEW COMPARISON:  None Available. FINDINGS: Right foot: There is no evidence of fracture or dislocation. No cortical erosion or periosteal reaction. Osseous deformity of the second metatarsal head, which may be sequela of prior trauma or infectious/inflammatory process. Soft tissue swelling about the heel and lateral aspect of the ankle. Left foot: Postsurgical changes for prior amputation through the distal first metatarsal and second metatarsophalangeal joint. No cortical erosion or periosteal reaction. Soft tissue swelling about the prior amputation site. IMPRESSION: RIGHT FOOT: No cortical erosion or periosteal reaction to suggest osteomyelitis. Soft tissue swelling about the heel and lateral aspect of the ankle. LEFT FOOT: Postsurgical changes for prior amputation through the distal first metatarsal and second metatarsophalangeal joint without evidence of osteomyelitis. Electronically Signed   By: Larose Hires D.O.   On: 11/02/2022 13:26             LOS: 2 days    Sunnie Nielsen, DO [facility] 11/04/2022, 1:31 PM    Dictation software may have been used to generate the above note. Typos may occur and escape review in typed/dictated notes. Please contact Dr Lyn Hollingshead directly for clarity if needed.  Staff may message me via secure chat in Epic  but this may not receive an immediate response,  please page me for urgent matters!  If 7PM-7AM, please contact night coverage [email or web address]

## 2022-11-05 ENCOUNTER — Encounter
Admission: EM | Disposition: A | Discharge status: Home / Self Care | Payer: Self-pay | Source: Home / Self Care | Attending: Internal Medicine

## 2022-11-05 DIAGNOSIS — I701 Atherosclerosis of renal artery: Secondary | ICD-10-CM

## 2022-11-05 DIAGNOSIS — A419 Sepsis, unspecified organism: Secondary | ICD-10-CM | POA: Diagnosis not present

## 2022-11-05 DIAGNOSIS — L97519 Non-pressure chronic ulcer of other part of right foot with unspecified severity: Secondary | ICD-10-CM | POA: Diagnosis not present

## 2022-11-05 DIAGNOSIS — M869 Osteomyelitis, unspecified: Secondary | ICD-10-CM | POA: Diagnosis not present

## 2022-11-05 DIAGNOSIS — M868X7 Other osteomyelitis, ankle and foot: Secondary | ICD-10-CM | POA: Diagnosis not present

## 2022-11-05 DIAGNOSIS — E1140 Type 2 diabetes mellitus with diabetic neuropathy, unspecified: Secondary | ICD-10-CM | POA: Diagnosis not present

## 2022-11-05 DIAGNOSIS — I70235 Atherosclerosis of native arteries of right leg with ulceration of other part of foot: Secondary | ICD-10-CM | POA: Diagnosis not present

## 2022-11-05 DIAGNOSIS — J449 Chronic obstructive pulmonary disease, unspecified: Secondary | ICD-10-CM | POA: Diagnosis not present

## 2022-11-05 HISTORY — PX: LOWER EXTREMITY ANGIOGRAPHY: CATH118251

## 2022-11-05 LAB — BASIC METABOLIC PANEL
Anion gap: 12 (ref 5–15)
BUN: 6 mg/dL (ref 6–20)
CO2: 27 mmol/L (ref 22–32)
Calcium: 7.7 mg/dL — ABNORMAL LOW (ref 8.9–10.3)
Chloride: 94 mmol/L — ABNORMAL LOW (ref 98–111)
Creatinine, Ser: 0.82 mg/dL (ref 0.61–1.24)
GFR, Estimated: >60 mL/min (ref >60)
Glucose, Bld: 203 mg/dL — ABNORMAL HIGH (ref 70–99)
Potassium: 3.3 mmol/L — ABNORMAL LOW (ref 3.5–5.1)
Sodium: 133 mmol/L — ABNORMAL LOW (ref 135–145)

## 2022-11-05 LAB — CBC
HCT: 27.8 % — ABNORMAL LOW (ref 39.0–52.0)
Hemoglobin: 8.9 g/dL — ABNORMAL LOW (ref 13.0–17.0)
MCH: 28.3 pg (ref 26.0–34.0)
MCHC: 32 g/dL (ref 30.0–36.0)
MCV: 88.5 fL (ref 80.0–100.0)
Platelets: 333 10*3/uL (ref 150–400)
RBC: 3.14 MIL/uL — ABNORMAL LOW (ref 4.22–5.81)
RDW: 12.8 % (ref 11.5–15.5)
WBC: 12 10*3/uL — ABNORMAL HIGH (ref 4.0–10.5)
nRBC: 0 % (ref 0.0–0.2)

## 2022-11-05 LAB — GLUCOSE, CAPILLARY
Glucose-Capillary: 226 mg/dL — ABNORMAL HIGH (ref 70–99)
Glucose-Capillary: 140 mg/dL — ABNORMAL HIGH (ref 70–99)
Glucose-Capillary: 172 mg/dL — ABNORMAL HIGH (ref 70–99)
Glucose-Capillary: 206 mg/dL — ABNORMAL HIGH (ref 70–99)

## 2022-11-05 LAB — VANCOMYCIN, PEAK: Vancomycin Pk: 29 ug/mL — ABNORMAL LOW (ref 30–40)

## 2022-11-05 LAB — CULTURE, BLOOD (ROUTINE X 2)
Culture: NO GROWTH
Special Requests: ADEQUATE

## 2022-11-05 SURGERY — LOWER EXTREMITY ANGIOGRAPHY
Anesthesia: Moderate Sedation | Laterality: Right

## 2022-11-05 MED ORDER — DIPHENHYDRAMINE HCL 50 MG/ML IJ SOLN: 50.0000 mg | Freq: Once | INTRAMUSCULAR | Status: DC | PRN

## 2022-11-05 MED ORDER — FENTANYL CITRATE (PF) 100 MCG/2ML IJ SOLN
INTRAMUSCULAR | Status: DC | PRN
  Administered 2022-11-05: 50 ug via INTRAVENOUS

## 2022-11-05 MED ORDER — HEPARIN SODIUM (PORCINE) 1000 UNIT/ML IJ SOLN
INTRAMUSCULAR | Status: DC | PRN
  Administered 2022-11-05: 5000 [IU] via INTRAVENOUS

## 2022-11-05 MED ORDER — SODIUM CHLORIDE 0.9 % IV SOLN: INTRAVENOUS | Status: DC

## 2022-11-05 MED ORDER — MIDAZOLAM HCL 5 MG/5ML IJ SOLN
INTRAMUSCULAR | Status: AC
  Filled 2022-11-05: qty 5

## 2022-11-05 MED ORDER — HYDROMORPHONE HCL 1 MG/ML IJ SOLN: 1.0000 mg | Freq: Once | INTRAMUSCULAR | Status: DC | PRN

## 2022-11-05 MED ORDER — HEPARIN SODIUM (PORCINE) 1000 UNIT/ML IJ SOLN
INTRAMUSCULAR | Status: AC
  Filled 2022-11-05: qty 10

## 2022-11-05 MED ORDER — METHYLPREDNISOLONE SODIUM SUCC 125 MG IJ SOLR: 125.0000 mg | Freq: Once | INTRAMUSCULAR | Status: DC | PRN

## 2022-11-05 MED ORDER — FENTANYL CITRATE (PF) 100 MCG/2ML IJ SOLN
INTRAMUSCULAR | Status: AC
  Filled 2022-11-05: qty 2

## 2022-11-05 MED ORDER — CLOPIDOGREL BISULFATE 75 MG PO TABS
75.0000 mg | ORAL_TABLET | Freq: Every day | ORAL | Status: DC
  Administered 2022-11-05 – 2022-11-24 (×20): 75 mg via ORAL
  Filled 2022-11-05 (×21): qty 1

## 2022-11-05 MED ORDER — MIDAZOLAM HCL 2 MG/2ML IJ SOLN
INTRAMUSCULAR | Status: DC | PRN
  Administered 2022-11-05: 2 mg via INTRAVENOUS

## 2022-11-05 MED ORDER — FAMOTIDINE 20 MG PO TABS: 40.0000 mg | ORAL_TABLET | Freq: Once | ORAL | Status: DC | PRN

## 2022-11-05 MED ORDER — ONDANSETRON HCL 4 MG/2ML IJ SOLN: 4.0000 mg | Freq: Four times a day (QID) | INTRAMUSCULAR | Status: DC | PRN

## 2022-11-05 MED ORDER — ASPIRIN 81 MG PO TBEC
81.0000 mg | DELAYED_RELEASE_TABLET | Freq: Every day | ORAL | Status: DC
  Administered 2022-11-05 – 2022-11-24 (×20): 81 mg via ORAL
  Filled 2022-11-05 (×22): qty 1

## 2022-11-05 MED ORDER — IODIXANOL 320 MG/ML IV SOLN
INTRAVENOUS | Status: DC | PRN
  Administered 2022-11-05: 45 mL via INTRA_ARTERIAL

## 2022-11-05 MED ORDER — MIDAZOLAM HCL 2 MG/ML PO SYRP
8.0000 mg | ORAL_SOLUTION | Freq: Once | ORAL | Status: DC | PRN
  Filled 2022-11-05: qty 5

## 2022-11-05 MED ORDER — INSULIN GLARGINE-YFGN 100 UNIT/ML ~~LOC~~ SOLN
44.0000 [IU] | Freq: Every day | SUBCUTANEOUS | Status: DC
  Administered 2022-11-05 – 2022-11-09 (×5): 44 [IU] via SUBCUTANEOUS
  Filled 2022-11-05 (×5): qty 0.44

## 2022-11-05 MED ORDER — INSULIN ASPART 100 UNIT/ML IJ SOLN
7.0000 [IU] | Freq: Three times a day (TID) | INTRAMUSCULAR | Status: DC
  Filled 2022-11-05: qty 1

## 2022-11-05 SURGICAL SUPPLY — 21 items
BALLN LUTONIX 018 5X100X130 (BALLOONS) ×1
BALLN LUTONIX 018 5X220X130 (BALLOONS) ×1
BALLN ULTRVRSE 3X300X150 (BALLOONS) ×1
BALLN ULTRVRSE 3X300X150 OTW (BALLOONS) ×1
BALLOON LUTONIX 018 5X100X130 (BALLOONS) IMPLANT
BALLOON LUTONIX 018 5X220X130 (BALLOONS) IMPLANT
BALLOON ULTRVRSE 3X300X150 OTW (BALLOONS) IMPLANT
CATH ANGIO 5F PIGTAIL 65CM (CATHETERS) IMPLANT
CATH VERT 5X100 (CATHETERS) IMPLANT
COVER PROBE ULTRASOUND 5X96 (MISCELLANEOUS) IMPLANT
DEVICE STARCLOSE SE CLOSURE (Vascular Products) IMPLANT
GLIDEWIRE ADV .035X260CM (WIRE) IMPLANT
KIT ENCORE 26 ADVANTAGE (KITS) IMPLANT
PACK ANGIOGRAPHY (CUSTOM PROCEDURE TRAY) ×1 IMPLANT
SHEATH ANL2 6FRX45 HC (SHEATH) IMPLANT
SHEATH BRITE TIP 5FRX11 (SHEATH) IMPLANT
STENT VIABAHN 6X100X120 (Permanent Stent) IMPLANT
SYR MEDRAD MARK 7 150ML (SYRINGE) IMPLANT
TUBING CONTRAST HIGH PRESS 72 (TUBING) IMPLANT
WIRE G V18X300 ST (WIRE) IMPLANT
WIRE GUIDERIGHT .035X150 (WIRE) IMPLANT

## 2022-11-05 NOTE — Op Note (Signed)
[facility]  Percutaneous Study/Intervention Procedural Note   Date of Surgery: 11/05/2022  Surgeon(s):Natsumi Whitsitt    Assistants:none  Pre-operative Diagnosis: PAD with ulceration and infection Right foot  Post-operative diagnosis:  Same  Procedure(s) Performed:             1.  Ultrasound guidance for vascular access left femoral artery             2.  Catheter placement into right common femoral artery from left femoral approach             3.  Aortogram and selective right lower extremity angiogram             4.  Percutaneous transluminal angioplasty of right anterior tibial artery with 3 mm diameter angioplasty balloon             5.  Percutaneous transluminal angioplasty of the right peroneal artery and tibioperoneal trunk with 3 mm diameter angioplasty balloon  6.  Percutaneous transluminal angioplasty of the right SFA with 5 mm diameter by 22 cm length Lutonix drug-coated angioplasty balloon  7.  Stent placement to the right SFA with 6 mm diameter by 10 cm length Viabahn stent for greater than 50% residual stenosis after angioplasty             8.  StarClose closure device left femoral artery  EBL: 10 cc  Contrast: 45 cc  Fluoro Time: 4.1 minutes  Moderate Conscious Sedation Time: approximately 42 minutes using 2 mg of Versed and 50 mcg of Fentanyl              Indications:  Patient is a 61 y.o.male with severe osteomyelitis and ulceration of the right foot. The patient is brought in for angiography for further evaluation and potential treatment.  Due to the limb threatening nature of the situation, angiogram was performed for attempted limb salvage. The patient is aware that if the procedure fails, amputation would be expected.  The patient also understands that even with successful revascularization, amputation may still be required due to the severity of the situation.  The podiatrist are concerned that he may not have enough salvageable foot for limb  salvage even with revascularization.  Risks and benefits are discussed and informed consent is obtained.   Procedure:  The patient was identified and appropriate procedural time out was performed.  The patient was then placed supine on the table and prepped and draped in the usual sterile fashion. Moderate conscious sedation was administered during a face to face encounter with the patient throughout the procedure with my supervision of the RN administering medicines and monitoring the patient's vital signs, pulse oximetry, telemetry and mental status throughout from the start of the procedure until the patient was taken to the recovery room. Ultrasound was used to evaluate the left common femoral artery.  It was patent .  A digital ultrasound image was acquired.  A Seldinger needle was used to access the left common femoral artery under direct ultrasound guidance and a permanent image was performed.  A 0.035 J wire was advanced without resistance and a 5Fr sheath was placed.  Pigtail catheter was placed into the aorta and an AP aortogram was performed. This demonstrated significant disease of both renal arteries, worse on the right, and normal aorta and iliac segments without significant stenosis. I then crossed the aortic bifurcation and advanced to the right femoral head. Selective right lower extremity angiogram was then performed. This demonstrated a focal stenosis  in the proximal to mid SFA in the 80% range, the vessel then normalized for several centimeters and then had calcific diffuse disease over the next 10 to 15 cm in the greater than 70% range.  The popliteal artery normalized and there was a tibial trifurcation is typical.  The anterior tibial artery was the dominant runoff but had 3 areas of greater than 70% stenosis throughout its course.  The tibioperoneal trunk and proximal peroneal artery had a high-grade stenosis in the 80 to 90% range within the peroneal artery provided a second runoff vessel  distally.  The posterior tibial artery was chronically occluded without distal reconstitution. It was felt that it was in the patient's best interest to proceed with intervention after these images to avoid a second procedure and a larger amount of contrast and fluoroscopy based off of the findings from the initial angiogram. The patient was systemically heparinized and a 6 [place] [employer] sheath was then placed over the [employer] wire. I then used a Kumpe catheter and the advantage wire to cross the SFA lesion and get into the anterior tibial artery and then exchanged for a V18 wire.  The V18 wire was parked in the foot.  I then proceeded with treatment.  A 3 mm diameter by 30 cm length angioplasty balloon was inflated to 10 atm for 1 minute from the mid to distal anterior tibial artery up to the origin.  Completion imaging showed marked improvement with less than 30% residual stenosis in the areas of stenosis.  I then used a Kumpe catheter and the V18 wire and redirected down the tibioperoneal trunk and peroneal artery to cross the proximal stenosis in the peroneal artery.  The wire was then parked distally.  The 3 mm diameter angioplasty balloon was then inflated in the proximal peroneal artery and tibioperoneal trunk and taken up to 8 atm for 1 minute.  Completion imaging showed marked improvement with only about a 15 to 20% residual stenosis.  I then turned my attention to the SFA.  A 5 mm diameter by 22 cm length Lutonix drug-coated angioplasty balloon was inflated to 10 atm to encompass the lesions in the SFA.  Completion imaging showed the mid to distal SFA to have less than 20% residual stenosis, but the proximal lesion had greater than 50% residual stenosis after angioplasty.  I elected to stent this area.  A 6 mm diameter by 10 cm length Viabahn stent was then deployed in the SFA and postdilated with a 5 mm diameter Lutonix drug-coated balloon with excellent angiographic completion result and less  than 10% residual stenosis. I elected to terminate the procedure. The sheath was removed and StarClose closure device was deployed in the left femoral artery with excellent hemostatic result. The patient was taken to the recovery room in stable condition having tolerated the procedure well.  Findings:               Aortogram:  This demonstrated significant disease of both renal arteries, worse on the right, and normal aorta and iliac segments without significant stenosis.             Right Lower Extremity:  This demonstrated a focal stenosis in the proximal to mid SFA in the 80% range, the vessel then normalized for several centimeters and then had calcific diffuse disease over the next 10 to 15 cm in the greater than 70% range.  The popliteal artery normalized and there was a tibial trifurcation is typical.  The anterior  tibial artery was the dominant runoff but had 3 areas of greater than 70% stenosis throughout its course.  The tibioperoneal trunk and proximal peroneal artery had a high-grade stenosis in the 80 to 90% range within the peroneal artery provided a second runoff vessel distally.  The posterior tibial artery was chronically occluded without distal reconstitution.   Disposition: Patient was taken to the recovery room in stable condition having tolerated the procedure well.  Complications: None  Festus Barren 11/05/2022 11:56 AM   This note was created with Dragon Medical transcription system. Any errors in dictation are purely unintentional.

## 2022-11-05 NOTE — TOC CM/SW Note (Signed)
Went by room for readmission prevention screen but patient off the unit. Will try again later.  Charlynn Court, CSW [phone]

## 2022-11-05 NOTE — Interval H&P Note (Signed)
History and Physical Interval Note:  11/05/2022 11:09 AM  Timothy [name].  has presented today for surgery, with the diagnosis of Right Ankle Wound.  The various methods of treatment have been discussed with the patient and family. After consideration of risks, benefits and other options for treatment, the patient has consented to  Procedure(s): Lower Extremity Angiography (Right) as a surgical intervention.  The patient's history has been reviewed, patient examined, no change in status, stable for surgery.  I have reviewed the patient's chart and labs.  Questions were answered to the patient's satisfaction.     Festus Barren

## 2022-11-05 NOTE — Progress Notes (Signed)
PROGRESS NOTE    Timothy [name].   ZOX:[id number] DOB: [birth year]  DOA: 11/02/2022 Date of Service: 11/05/22 PCP: Nira Retort     Brief Narrative / Hospital Course:  Timothy Pacifico. is a 62 y.o. male with medical history significant of hypertension, hyperlipidemia, diabetes mellitus, COPD on 2-3 L oxygen (2 L in the daytime, and 3L in night), diastolic CHF, depression with anxiety, PTSD, bipolar, DVT on Eliquis, who presents from podiatry office with bilateral foot ulcer and pain, unresponsive to outpatient abx.  Patient has a large and deep ulcer in right heel  and a small ulcer in the left plantar area.  05/17: MRI (+) osteomyelitis R calcaneus. Podiatry recs for BKA. Vasc surgery planning for RLE angio on Monday.  05/18-05/19 (weekend): stable 05/20: RLE angio today   Consultants:  Podiatry Vascular Surgery   Procedures: 11/05/22 RLE angiography w/ angioplasty      ASSESSMENT & PLAN:   Principal Problem:   Osteomyelitis of right foot (HCC) Active Problems:   Sepsis (HCC)   Type 2 diabetes mellitus with diabetic neuropathy, unspecified (HCC)   Essential hypertension   Chronic diastolic CHF (congestive heart failure) (HCC)   COPD (chronic obstructive pulmonary disease) (HCC)   HLD (hyperlipidemia)   DVT (deep venous thrombosis) (HCC)   Current smoker   Depression with anxiety   PAD (peripheral artery disease) (HCC)  Sepsis due to osteomyelitis of right calcaneus and R diabetic ulcer present on admission  L foot stage 3 diabetic/pressure ulcer present on admission Patient meets criteria for sepsis with WBC 14.8 and, heart rate 99, lactic acid normal 1.3. vancomycin, Flagyl, Rocephin PRN Zofran for nausea tylenol, Percocet for pain Blood cultures x 2 wound care consult Podiatry consult --> advise BKA w/ vascular surgery -->  11/05/2022 right lower extremity angiogram with intervention.    Type 2 diabetes mellitus with diabetic neuropathy,  unspecified [facility]): Patient has blood sugar 437, anion gap 17, hydroxybutyric acid 2.55, but bicarbonate is 22, pH 7.43 on VBG, patient may be on early stage of developing DKA on admission SSI Glargine insulin 44 units daily   Hypokalemia Replace as needed Monitor BMP  Essential hypertension IV hydralazine as needed held amlodipine and metoprolol since patient at risk of developing hypotension but BP has improved may be able to restart metoprolol tomorrow  Continue home clonidine   Chronic diastolic CHF (congestive heart failure) (HCC):  2D echo on 06/07/2021 showed EF of 55 to 60% with grade 1 diastolic dysfunction.  Patient does not have leg edema JVD.  CHF is compensated.  BNP normal 72.6 Watch volume status closely   COPD (chronic obstructive pulmonary disease) (HCC): Stable Bronchodilators   HLD (hyperlipidemia) Crestor   History of DVT (deep venous thrombosis) (HCC) Switched Eliquis to heparin due to upcoming surgery   Current smoker Nicotine patch   Depression with anxiety Continue home medications   PAD (peripheral artery disease) (HCC) plan for Monday, 11/05/2022 RLE angiogram with possible intervention.    DVT prophylaxis: heparin  Pertinent IV fluids/nutrition: d/c IVF, encourage po intake  Central lines / invasive devices: none  Code Status: FULL CODE ACP documentation reviewed: 11/03/22 none on file VYNCA  Current Admission Status: inpatient   TOC needs / Dispo plan: pend surgical intervention likely will need SNF Barriers to discharge / significant pending items: angio today, pending likely amputation, expect will be here into late week             Subjective / Brief ROS:  Patient reports no concerns  Denies CP/SOB.  Pain controlled.    Family Communication: pt declined call to support person(s)    Objective Findings:  Vitals:   11/05/22 1149 11/05/22 1150 11/05/22 1154 11/05/22 1200  BP: (!) 146/86  (!) 152/77 139/75  Pulse: 82  84  85  Resp: 18  19 15   Temp:      TempSrc:      SpO2:  97%  (!) 88%  Weight:      Height:        Intake/Output Summary (Last 24 hours) at 11/05/2022 1215 Last data filed at 11/05/2022 0300 Gross per 24 hour  Intake 1179.19 ml  Output 1450 ml  Net -270.81 ml   Filed Weights   11/02/22 1103  Weight: 89.4 kg    Examination:  Physical Exam Constitutional:      General: He is not in acute distress.    Appearance: Normal appearance.  Cardiovascular:     Rate and Rhythm: Normal rate and regular rhythm.  Pulmonary:     Effort: Pulmonary effort is normal.     Breath sounds: Normal breath sounds.  Abdominal:     General: Abdomen is flat.     Palpations: Abdomen is soft.  Skin:    General: Skin is warm and dry.     Comments: Bandages to lower extremities were not removed   Neurological:     General: No focal deficit present.     Mental Status: He is alert. Mental status is at baseline.  Psychiatric:        Mood and Affect: Mood normal.        Behavior: Behavior normal.          Scheduled Medications:   [MAR Hold] ARIPiprazole  10 mg Oral Daily   aspirin EC  81 mg Oral Daily   [MAR Hold] buPROPion  150 mg Oral BID   [MAR Hold] cloNIDine  0.1 mg Oral QHS   clopidogrel  75 mg Oral Daily   [MAR Hold] cyanocobalamin  1,000 mcg Oral Daily   [MAR Hold] gabapentin  400 mg Oral TID   [MAR Hold] heparin injection (subcutaneous)  5,000 Units Subcutaneous Q8H   [MAR Hold] insulin aspart  0-5 Units Subcutaneous QHS   [MAR Hold] insulin aspart  0-9 Units Subcutaneous TID WC   [MAR Hold] insulin aspart  7 Units Subcutaneous TID WC   [MAR Hold] insulin glargine-yfgn  44 Units Subcutaneous Daily   [MAR Hold] magnesium oxide  800 mg Oral BID   [MAR Hold] mometasone-formoterol  2 puff Inhalation BID   [MAR Hold] pantoprazole  40 mg Oral Daily   [MAR Hold] rosuvastatin  5 mg Oral QHS   [MAR Hold] umeclidinium bromide  1 puff Inhalation Daily   [MAR Hold] venlafaxine XR  225 mg Oral  Daily    Continuous Infusions:  [MAR Hold] cefTRIAXone (ROCEPHIN)  IV 2 g (11/04/22 1254)   [MAR Hold] metronidazole 500 mg (11/05/22 0025)   [MAR Hold] vancomycin 1,000 mg (11/05/22 0522)    PRN Medications:  [MAR Hold] acetaminophen, [MAR Hold] albuterol, [MAR Hold] dextromethorphan-guaiFENesin, [MAR Hold] hydrALAZINE, [MAR Hold]  HYDROmorphone (DILAUDID) injection, iodixanol, [MAR Hold] ondansetron (ZOFRAN) IV, [MAR Hold] oxyCODONE-acetaminophen  Antimicrobials from admission:  Anti-infectives (From admission, onward)    Start     Dose/Rate Route Frequency Ordered Stop   11/03/22 0600  [MAR Hold]  vancomycin (VANCOCIN) IVPB 1000 mg/200 mL premix        (MAR Hold since  Mon 11/05/2022 at 1041.Hold Reason: Transfer to a Procedural area)   1,000 mg 200 mL/hr over 60 Minutes Intravenous Every 12 hours 11/02/22 1316     11/02/22 1330  vancomycin (VANCOREADY) IVPB 750 mg/150 mL        750 mg 150 mL/hr over 60 Minutes Intravenous  Once 11/02/22 1316 11/02/22 1750   11/02/22 1300  [MAR Hold]  cefTRIAXone (ROCEPHIN) 2 g in sodium chloride 0.9 % 100 mL IVPB        (MAR Hold since Mon 11/05/2022 at 1041.Hold Reason: Transfer to a Procedural area)   2 g 200 mL/hr over 30 Minutes Intravenous Every 24 hours 11/02/22 1255     11/02/22 1300  [MAR Hold]  metroNIDAZOLE (FLAGYL) IVPB 500 mg        (MAR Hold since Mon 11/05/2022 at 1041.Hold Reason: Transfer to a Procedural area)   500 mg 100 mL/hr over 60 Minutes Intravenous Every 12 hours 11/02/22 1255     11/02/22 1200  vancomycin (VANCOCIN) IVPB 1000 mg/200 mL premix        1,000 mg 200 mL/hr over 60 Minutes Intravenous  Once 11/02/22 1150 11/02/22 1333           Data Reviewed:  I have personally reviewed the following...  CBC: Recent Labs  Lab 11/02/22 1121 11/03/22 0516 11/04/22 0443 11/05/22 0451  WBC 14.8* 12.1* 11.6* 12.0*  NEUTROABS 12.3*  --   --   --   HGB 10.9* 9.7* 8.9* 8.9*  HCT 33.5* 29.6* 27.1* 27.8*  MCV 87.9 86.8  87.4 88.5  PLT 436* 318 302 333   Basic Metabolic Panel: Recent Labs  Lab 11/02/22 1121 11/03/22 0516 11/04/22 0443 11/05/22 0451  NA 127* 130* 131* 133*  K 4.3 3.3* 3.0* 3.3*  CL 88* 98 97* 94*  CO2 22 24 27 27   GLUCOSE 437* 231* 179* 203*  BUN 20 11 7 6   CREATININE 1.05 0.75 0.70 0.82  CALCIUM 8.7* 7.6* 7.5* 7.7*   GFR: Estimated Creatinine Clearance: 107.9 mL/min (by C-G formula based on SCr of 0.82 mg/dL). Liver Function Tests: Recent Labs  Lab 11/02/22 1121  AST 16  ALT 13  ALKPHOS 163*  BILITOT 1.0  PROT 7.7  ALBUMIN 2.8*   No results for input(s): "LIPASE", "AMYLASE" in the last 168 hours. No results for input(s): "AMMONIA" in the last 168 hours. Coagulation Profile: Recent Labs  Lab 11/02/22 1121 11/02/22 1825  INR 1.2 1.3*   Cardiac Enzymes: No results for input(s): "CKTOTAL", "CKMB", "CKMBINDEX", "TROPONINI" in the last 168 hours. BNP (last 3 results) No results for input(s): "PROBNP" in the last 8760 hours. HbA1C: No results for input(s): "HGBA1C" in the last 72 hours. CBG: Recent Labs  Lab 11/04/22 0812 11/04/22 1159 11/04/22 1730 11/04/22 2057 11/05/22 0819  GLUCAP 201* 163* 190* 272* 226*   Lipid Profile: No results for input(s): "CHOL", "HDL", "LDLCALC", "TRIG", "CHOLHDL", "LDLDIRECT" in the last 72 hours. Thyroid Function Tests: No results for input(s): "TSH", "T4TOTAL", "FREET4", "T3FREE", "THYROIDAB" in the last 72 hours. Anemia Panel: No results for input(s): "VITAMINB12", "FOLATE", "FERRITIN", "TIBC", "IRON", "RETICCTPCT" in the last 72 hours. Most Recent Urinalysis On File:     Component Value Date/Time   COLORURINE YELLOW (A) 11/02/2022 1925   APPEARANCEUR CLEAR (A) 11/02/2022 1925   APPEARANCEUR Cloudy (A) 01/13/2020 1156   LABSPEC 1.023 11/02/2022 1925   PHURINE 5.0 11/02/2022 1925   GLUCOSEU >=500 (A) 11/02/2022 1925   HGBUR SMALL (A) 11/02/2022 1925   BILIRUBINUR  NEGATIVE 11/02/2022 1925   BILIRUBINUR Negative  01/13/2020 1156   KETONESUR 5 (A) 11/02/2022 1925   PROTEINUR 30 (A) 11/02/2022 1925   NITRITE NEGATIVE 11/02/2022 1925   LEUKOCYTESUR NEGATIVE 11/02/2022 1925   Sepsis Labs: @LABRCNTIP (procalcitonin:4,lacticidven:4) Microbiology: Recent Results (from the past 240 hour(s))  Culture, blood (Routine x 2)     Status: None (Preliminary result)   Collection Time: 11/02/22 11:16 AM   Specimen: BLOOD  Result Value Ref Range Status   Specimen Description BLOOD RIGHT ANTECUBITAL  Final   Special Requests   Final    BOTTLES DRAWN AEROBIC AND ANAEROBIC Blood Culture adequate volume   Culture   Final    NO GROWTH 3 DAYS Performed at [facility], [address], [place], [place] [address]    Report Status PENDING  Incomplete  Culture, blood (Routine x 2)     Status: None (Preliminary result)   Collection Time: 11/02/22 11:21 AM   Specimen: BLOOD  Result Value Ref Range Status   Specimen Description BLOOD BLOOD RIGHT ARM  Final   Special Requests   Final    BOTTLES DRAWN AEROBIC AND ANAEROBIC Blood Culture adequate volume   Culture   Final    NO GROWTH 3 DAYS Performed at [facility], [address], [place], [place] [address]    Report Status PENDING  Incomplete      Radiology Studies last 3 days: PERIPHERAL VASCULAR CATHETERIZATION  Result Date: 11/05/2022 See surgical note for result.  MR ANKLE RIGHT WO CONTRAST  Result Date: 11/02/2022 CLINICAL DATA:  Osteomyelitis suspected. Right foot wound and cellulitis. EXAM: MRI OF THE RIGHT ANKLE WITHOUT CONTRAST TECHNIQUE: Multiplanar, multisequence MR imaging of the ankle was performed. No intravenous contrast was administered. COMPARISON:  None Available. FINDINGS: TENDONS Peroneal: Peroneal longus tendon intact. Peroneal brevis intact. Fluid tracking along the peroneus brevis and peroneus longus tendon suggesting tenosynovitis. Posteromedial: Posterior tibial tendon intact. Flexor hallucis longus tendon intact.  Flexor digitorum longus tendon intact. Anterior: Tibialis anterior tendon intact. Extensor hallucis longus tendon intact Extensor digitorum longus tendon intact. Achilles: Mild edema along the distal Achilles tendon suggesting peritenonitis. Achilles tendon is however intact. Plantar Fascia: Intact. LIGAMENTS Lateral: Anterior talofibular ligament intact. Calcaneofibular ligament intact. Posterior talofibular ligament intact. Anterior and posterior tibiofibular ligaments intact. Medial: Deltoid ligament intact. Spring ligament intact. CARTILAGE Ankle Joint: No joint effusion. Normal ankle mortise. No chondral defect. Subtalar Joints/Sinus Tarsi: Normal subtalar joints. No subtalar joint effusion. Normal sinus tarsi. Evaluation of ankle and subtalar joints is limited due to motion. Bones: Bone marrow edema of the lateral aspect of the calcaneus consistent with osteomyelitis. No evidence of fracture or dislocation. Soft Tissue: Large deep skin wound about the lateral aspect of the calcaneus measuring at least 3.5 x 1.0 x 2.4 cm. Marked soft tissue swelling about the medial and lateral aspect of the ankle. IMPRESSION: 1. Large deep skin wound about the lateral aspect of the calcaneus measuring at least 3.5 x 1.0 x 2.4 cm. 2. Bone marrow edema of the lateral aspect of the calcaneus adjacent to the above-mentioned deep skin wound consistent with osteomyelitis. 3. Fluid tracking along the peroneus brevis and peroneus longus tendon suggesting tenosynovitis. 4. Mild edema along the distal Achilles tendon suggesting peritenonitis, likely secondary to adjacent infectious/inflammatory process. 5. Evaluation of ankle and subtalar joints is limited due to motion. Electronically Signed   By: Larose Hires D.O.   On: 11/02/2022 14:38   DG Foot 2 Views Left  Result Date: 11/02/2022  CLINICAL DATA:  Diabetic foot infection.  Bilateral feet wounds. EXAM: LEFT FOOT - 2 VIEW; RIGHT FOOT - 2 VIEW COMPARISON:  None Available. FINDINGS:  Right foot: There is no evidence of fracture or dislocation. No cortical erosion or periosteal reaction. Osseous deformity of the second metatarsal head, which may be sequela of prior trauma or infectious/inflammatory process. Soft tissue swelling about the heel and lateral aspect of the ankle. Left foot: Postsurgical changes for prior amputation through the distal first metatarsal and second metatarsophalangeal joint. No cortical erosion or periosteal reaction. Soft tissue swelling about the prior amputation site. IMPRESSION: RIGHT FOOT: No cortical erosion or periosteal reaction to suggest osteomyelitis. Soft tissue swelling about the heel and lateral aspect of the ankle. LEFT FOOT: Postsurgical changes for prior amputation through the distal first metatarsal and second metatarsophalangeal joint without evidence of osteomyelitis. Electronically Signed   By: Larose Hires D.O.   On: 11/02/2022 13:26   DG Foot 2 Views Right  Result Date: 11/02/2022 CLINICAL DATA:  Diabetic foot infection.  Bilateral feet wounds. EXAM: LEFT FOOT - 2 VIEW; RIGHT FOOT - 2 VIEW COMPARISON:  None Available. FINDINGS: Right foot: There is no evidence of fracture or dislocation. No cortical erosion or periosteal reaction. Osseous deformity of the second metatarsal head, which may be sequela of prior trauma or infectious/inflammatory process. Soft tissue swelling about the heel and lateral aspect of the ankle. Left foot: Postsurgical changes for prior amputation through the distal first metatarsal and second metatarsophalangeal joint. No cortical erosion or periosteal reaction. Soft tissue swelling about the prior amputation site. IMPRESSION: RIGHT FOOT: No cortical erosion or periosteal reaction to suggest osteomyelitis. Soft tissue swelling about the heel and lateral aspect of the ankle. LEFT FOOT: Postsurgical changes for prior amputation through the distal first metatarsal and second metatarsophalangeal joint without evidence of  osteomyelitis. Electronically Signed   By: Larose Hires D.O.   On: 11/02/2022 13:26             LOS: 3 days    Sunnie Nielsen, DO [facility] 11/05/2022, 12:15 PM    Dictation software may have been used to generate the above note. Typos may occur and escape review in typed/dictated notes. Please contact Dr Lyn Hollingshead directly for clarity if needed.  Staff may message me via secure chat in Epic  but this may not receive an immediate response,  please page me for urgent matters!  If 7PM-7AM, please contact night coverage [email or web address]

## 2022-11-06 ENCOUNTER — Encounter: Payer: Self-pay | Admitting: Vascular Surgery

## 2022-11-06 DIAGNOSIS — J449 Chronic obstructive pulmonary disease, unspecified: Secondary | ICD-10-CM | POA: Diagnosis not present

## 2022-11-06 DIAGNOSIS — A419 Sepsis, unspecified organism: Secondary | ICD-10-CM | POA: Diagnosis not present

## 2022-11-06 DIAGNOSIS — M868X7 Other osteomyelitis, ankle and foot: Secondary | ICD-10-CM | POA: Diagnosis not present

## 2022-11-06 DIAGNOSIS — E1140 Type 2 diabetes mellitus with diabetic neuropathy, unspecified: Secondary | ICD-10-CM | POA: Diagnosis not present

## 2022-11-06 LAB — BASIC METABOLIC PANEL
Anion gap: 8 (ref 5–15)
BUN: <5 mg/dL — ABNORMAL LOW (ref 6–20)
CO2: 30 mmol/L (ref 22–32)
Calcium: 8.1 mg/dL — ABNORMAL LOW (ref 8.9–10.3)
Chloride: 97 mmol/L — ABNORMAL LOW (ref 98–111)
Creatinine, Ser: 0.74 mg/dL (ref 0.61–1.24)
GFR, Estimated: >60 mL/min (ref >60)
Glucose, Bld: 132 mg/dL — ABNORMAL HIGH (ref 70–99)
Potassium: 3.5 mmol/L (ref 3.5–5.1)
Sodium: 135 mmol/L (ref 135–145)

## 2022-11-06 LAB — CULTURE, BLOOD (ROUTINE X 2)
Culture: NO GROWTH
Special Requests: ADEQUATE

## 2022-11-06 LAB — TYPE AND SCREEN
ABO/RH(D): A POS
Antibody Screen: NEGATIVE

## 2022-11-06 LAB — CBC
HCT: 29.9 % — ABNORMAL LOW (ref 39.0–52.0)
Hemoglobin: 9.6 g/dL — ABNORMAL LOW (ref 13.0–17.0)
MCH: 28.6 pg (ref 26.0–34.0)
MCHC: 32.1 g/dL (ref 30.0–36.0)
MCV: 89 fL (ref 80.0–100.0)
Platelets: 379 10*3/uL (ref 150–400)
RBC: 3.36 MIL/uL — ABNORMAL LOW (ref 4.22–5.81)
RDW: 13 % (ref 11.5–15.5)
WBC: 9.8 10*3/uL (ref 4.0–10.5)
nRBC: 0 % (ref 0.0–0.2)

## 2022-11-06 LAB — GLUCOSE, CAPILLARY
Glucose-Capillary: 155 mg/dL — ABNORMAL HIGH (ref 70–99)
Glucose-Capillary: 211 mg/dL — ABNORMAL HIGH (ref 70–99)
Glucose-Capillary: 143 mg/dL — ABNORMAL HIGH (ref 70–99)
Glucose-Capillary: 78 mg/dL (ref 70–99)

## 2022-11-06 LAB — VANCOMYCIN, TROUGH: Vancomycin Tr: 10 ug/mL — ABNORMAL LOW (ref 15–20)

## 2022-11-06 MED ORDER — INSULIN ASPART 100 UNIT/ML IJ SOLN
8.0000 [IU] | Freq: Three times a day (TID) | INTRAMUSCULAR | Status: DC
  Administered 2022-11-06 – 2022-11-09 (×6): 8 [IU] via SUBCUTANEOUS
  Filled 2022-11-06 (×6): qty 1

## 2022-11-06 MED ORDER — CHLORHEXIDINE GLUCONATE CLOTH 2 % EX PADS
6.0000 | MEDICATED_PAD | Freq: Once | CUTANEOUS | Status: AC
  Administered 2022-11-07: 6 via TOPICAL

## 2022-11-06 MED ORDER — CHLORHEXIDINE GLUCONATE CLOTH 2 % EX PADS
6.0000 | MEDICATED_PAD | Freq: Once | CUTANEOUS | Status: AC
  Administered 2022-11-06: 6 via TOPICAL

## 2022-11-06 MED ORDER — METOPROLOL SUCCINATE ER 50 MG PO TB24
50.0000 mg | ORAL_TABLET | Freq: Every day | ORAL | Status: DC
  Administered 2022-11-06 – 2022-11-24 (×19): 50 mg via ORAL
  Filled 2022-11-06 (×21): qty 1

## 2022-11-06 NOTE — Progress Notes (Signed)
Pharmacy Antibiotic Note  Timothy [name]. is a 62 y.o. male w/ PMH of HTN, hLD, DM, COPD, CHF, depression with anxiety, PTSD, bipolar, DVT on Eliquis admitted on 11/02/2022 with cellulitis.  Pharmacy has been consulted for vancomycin dosing.  Plan: start vancomycin 750 mg IV x 1 to complete loading dose (received 1000 mg IV vancomycin) then 1000 mg IV Q 12 hrs.  Goal AUC 400-550. Expected AUC: 453.5 SCr used: 1.05 mg/dL  1/61:  Vancomycin 1 gm IV given @ 1819 5/20:  Vanc peak @ 1936 = 29 5/21:  Vanc trough @ 0532 = 10  Calc AUC = 425.7 (therapeutic) Vanc trough = 9.2   - Will continue pt on Vancomycin 1 gm IV Q12H    Height: 5\' 10"  (177.8 cm) Weight: 89.4 kg (197 lb 1.5 oz) IBW/kg (Calculated) : 73  Temp (24hrs), Avg:99 F (37.2 C), Min:98 F (36.7 C), Max:99.6 F (37.6 C)  Recent Labs  Lab 11/02/22 1121 11/02/22 1825 11/03/22 0516 11/04/22 0443 11/05/22 0451 11/05/22 1936 11/06/22 0532  WBC 14.8*  --  12.1* 11.6* 12.0*  --  9.8  CREATININE 1.05  --  0.75 0.70 0.82  --  0.74  LATICACIDVEN 1.3 1.1  --   --   --   --   --   VANCOTROUGH  --   --   --   --   --   --  10*  VANCOPEAK  --   --   --   --   --  29*  --      Estimated Creatinine Clearance: 110.6 mL/min (by C-G formula based on SCr of 0.74 mg/dL).    Allergies  Allergen Reactions   Bee Venom Anaphylaxis   Cheese Anaphylaxis, Swelling and Other (See Comments)    Blue Cheese only   Coffea Arabica Shortness Of Breath   Coffee Flavor Shortness Of Breath    Antimicrobials this admission: 05/17 vancomycin >>  05/17 metronidazole >> 05/17 ceftriaxone  >>   Microbiology results: 05/17 BCx: pending  Thank you for allowing pharmacy to be a part of this patient's care.  Timothy [name] D 11/06/2022 7:05 AM

## 2022-11-06 NOTE — TOC Initial Note (Signed)
Transition of Care (TOC) - Initial/Assessment Note    Patient Details  Name: Timothy [name]. MRN: [id number] Date of Birth: 1960-07-29  Transition of Care [facility]) CM/SW Contact:    Margarito Liner, LCSW Phone Number: 11/06/2022, 12:13 PM  Clinical Narrative: Readmission prevention screen complete. CSW met with patient. No supports at bedside. CSW introduced role and explained that discharge planning would be discussed. PCP is Dr. Terance Hart and [facility]. His roommate transports him to appointments. [facility] [place]. He reports difficulty affording his medications. Pharmacy is aware. They said he has Medicaid so his medications should cost $4. Pharmacy will speak to him. No home health prior to admission. He has a cane. No further concerns. CSW encouraged patient to contact CSW as needed. CSW will continue to follow patient for support and facilitate discharge once medically stable. His roommate Alinda Money will transport him home at discharge.                 Expected Discharge Plan:  (TBD) Barriers to Discharge: Continued Medical Work up   Patient Goals and CMS Choice            Expected Discharge Plan and Services     Post Acute Care Choice:  (TBD) Living arrangements for the past 2 months: Single Family Home                                      Prior Living Arrangements/Services Living arrangements for the past 2 months: Single Family Home Lives with:: Roommate Patient language and need for interpreter reviewed:: Yes Do you feel safe going back to the place where you live?: Yes      Need for Family Participation in Patient Care: Yes (Comment) Care giver support system in place?: Yes (comment) Current home services: DME Criminal Activity/Legal Involvement Pertinent to Current Situation/Hospitalization: No - Comment as needed  Activities of Daily Living Home Assistive Devices/Equipment: None ADL Screening (condition at time of admission) Patient's  cognitive ability adequate to safely complete daily activities?: Yes Is the patient deaf or have difficulty hearing?: No Does the patient have difficulty seeing, even when wearing glasses/contacts?: No Does the patient have difficulty concentrating, remembering, or making decisions?: No Patient able to express need for assistance with ADLs?: Yes Does the patient have difficulty dressing or bathing?: No Independently performs ADLs?: Yes (appropriate for developmental age) Does the patient have difficulty walking or climbing stairs?: No Weakness of Legs: None Weakness of Arms/Hands: None  Permission Sought/Granted                  Emotional Assessment Appearance:: Appears stated age Attitude/Demeanor/Rapport: Engaged, Gracious Affect (typically observed): Accepting, Appropriate, Calm Orientation: : Oriented to Self, Oriented to Place, Oriented to  Time, Oriented to Situation Alcohol / Substance Use: Not Applicable Psych Involvement: No (comment)  Admission diagnosis:  DKA (diabetic ketoacidosis) (HCC) [E11.10] Cellulitis of right lower extremity [L03.115] Diabetic foot infection (HCC) [W09.811, L08.9] Patient Active Problem List   Diagnosis Date Noted   Pressure injury of skin 11/03/2022   History of DVT (deep vein thrombosis) 11/03/2022   Diabetic foot infection (HCC) 11/02/2022   Depression with anxiety 11/02/2022   HLD (hyperlipidemia) 11/02/2022   Sepsis (HCC) 11/02/2022   PAD (peripheral artery disease) (HCC) 11/02/2022   Osteomyelitis of right foot (HCC) 11/02/2022   Hyperlipidemia, mixed 10/02/2022   Fall 06/27/2022   Bipolar  disorder with psychotic features (HCC) 05/28/2022   Generalized anxiety disorder 05/08/2022   Facial abscess 08/08/2021   Facial cellulitis 08/08/2021   Uncontrolled type 2 diabetes mellitus with hyperglycemia (HCC) 08/08/2021   COPD (chronic obstructive pulmonary disease) (HCC) 08/08/2021   Atherosclerosis of native arteries of the  extremities with ulceration (HCC) 04/18/2021   DJD (degenerative joint disease), lumbosacral 04/18/2021   Leg pain 01/23/2021   Acute deep vein thrombosis (DVT) of left lower extremity (HCC) 01/23/2021   Atherosclerosis of native arteries of extremity with intermittent claudication (HCC) 01/22/2021   MDD (major depressive disorder), recurrent severe, without psychosis (HCC) 12/19/2020   Severe episode of recurrent major depressive disorder, with psychotic features (HCC)    COPD with acute exacerbation (HCC) 12/14/2020   Hx of sleep apnea 12/14/2020   Empyema lung (HCC) 08/15/2020   Type 2 diabetes mellitus with hyperglycemia (HCC) 08/11/2020   Respiratory failure with hypoxia (HCC) 08/11/2020   Hyponatremia 08/11/2020   Chronic diastolic CHF (congestive heart failure) (HCC) 08/11/2020   Anemia 08/11/2020   Abdominal pain 08/11/2020   Empyema (HCC) 08/10/2020   Osteomyelitis (HCC) 03/15/2020   Open wound of foot 01/20/2020   Elevated lipids 01/20/2020   Personal history of colonic polyps    Shortness of breath 11/04/2019   Encounter to establish care 10/15/2019   Essential hypertension 10/15/2019   Controlled diabetes mellitus type 2 with complications (HCC) 10/15/2019   Current smoker 10/15/2019   Peripheral neuropathy 10/15/2019   Cramp, abdominal 10/15/2019   History of hepatitis 10/15/2019   History of gastroesophageal reflux (GERD) 10/15/2019   Type 2 diabetes mellitus with diabetic neuropathy, unspecified (HCC) 10/15/2019   Post-traumatic stress disorder, unspecified 07/14/2019   Antisocial personality disorder (HCC) 07/14/2019   PCP:  Nira Retort [id number] [place] [place] - [address] [place] [address] Phone: [phone] Fax: [phone]     Social Determinants of Health (SDOH) Social History: SDOH Screenings   Food Insecurity: No Food Insecurity (11/02/2022)  Housing: Low Risk  (11/02/2022)   Transportation Needs: No Transportation Needs (11/02/2022)  Utilities: Not At Risk (11/02/2022)  Alcohol Screen: Low Risk  (02/10/2020)  Depression (PHQ2-9): High Risk (10/03/2022)  Financial Resource Strain: Medium Risk (05/28/2022)  Physical Activity: Inactive (05/28/2022)  Social Connections: Socially Isolated (05/28/2022)  Stress: Stress Concern Present (05/28/2022)  Tobacco Use: High Risk (11/06/2022)   SDOH Interventions:     Readmission Risk Interventions    11/06/2022   12:06 PM  Readmission Risk Prevention Plan  Transportation Screening Complete  PCP or Specialist Appt within 3-5 Days Complete  Social Work Consult for Recovery Care Planning/Counseling Complete  Palliative Care Screening Not Applicable  Medication Review Oceanographer) Referral to Pharmacy

## 2022-11-06 NOTE — Progress Notes (Signed)
PROGRESS NOTE    Timothy [name].   ZOX:[id number] DOB: 09-Nov-1960  DOA: 11/02/2022 Date of Service: 11/06/22 PCP: Nira Retort     Brief Narrative / Hospital Course:  Timothy [name]. is a 62 y.o. male with medical history significant of hypertension, hyperlipidemia, diabetes mellitus, COPD on 2-3 L oxygen (2 L in the daytime, and 3L in night), diastolic CHF, depression with anxiety, PTSD, bipolar, DVT on Eliquis, who presents from podiatry office with bilateral foot ulcer and pain, unresponsive to outpatient abx.  Patient has a large and deep ulcer in right heel  and a small ulcer in the left plantar area.  05/17: MRI (+) osteomyelitis R calcaneus. Podiatry recs for BKA. Vasc surgery planning for RLE angio on Monday.  05/18-05/19 (weekend): stable 05/20: RLE angiography w/ angioplasty today w/ Dr Wyn Quaker 05/21: awaiting amputation tomorrow   Consultants:  Podiatry Vascular Surgery   Procedures: 11/05/2022 RLE angiography w/ angioplasty      ASSESSMENT & PLAN:   Principal Problem:   Osteomyelitis of right foot (HCC) Active Problems:   Sepsis (HCC)   Type 2 diabetes mellitus with diabetic neuropathy, unspecified (HCC)   Essential hypertension   Chronic diastolic CHF (congestive heart failure) (HCC)   COPD (chronic obstructive pulmonary disease) (HCC)   HLD (hyperlipidemia)   DVT (deep venous thrombosis) (HCC)   Current smoker   Depression with anxiety   PAD (peripheral artery disease) (HCC)  Sepsis due to osteomyelitis of right calcaneus and R diabetic ulcer present on admission  L foot stage 3 diabetic/pressure ulcer present on admission Patient meets criteria for sepsis with WBC 14.8 and, heart rate 99, lactic acid normal 1.3. vancomycin, Flagyl, Rocephin PRN Zofran for nausea tylenol, Percocet for pain Blood cultures x 2 wound care consult BKA pending    Type 2 diabetes mellitus with diabetic neuropathy, unspecified (HCC): Patient has blood  sugar 437, anion gap 17, hydroxybutyric acid 2.55, but bicarbonate is 22, pH 7.43 on VBG, patient may be on early stage of developing DKA on admission SSI Glargine insulin 44 units daily   Hypokalemia Replace as needed Monitor BMP  Essential hypertension IV hydralazine as needed held amlodipine and metoprolol since patient at risk of developing hypotension but BP has improved, restarted metoprolol  Continue home clonidine   Chronic diastolic CHF (congestive heart failure) (HCC):  2D echo on 06/07/2021 showed EF of 55 to 60% with grade 1 diastolic dysfunction.  Patient does not have leg edema JVD.  CHF is compensated.  BNP normal 72.6 Watch volume status closely   COPD (chronic obstructive pulmonary disease) (HCC): Stable Bronchodilators   HLD (hyperlipidemia) Crestor   History of DVT (deep venous thrombosis) (HCC) Switched Eliquis to heparin due to upcoming surgery   Current smoker Nicotine patch   Depression with anxiety Continue home medications   PAD (peripheral artery disease) (HCC) s/p  RLE angiogram 11/05/2022 Statin, Plavix    DVT prophylaxis: heparin  Pertinent IV fluids/nutrition: d/c IVF, encourage po intake  Central lines / invasive devices: none  Code Status: FULL CODE ACP documentation reviewed: 11/03/22 none on file VYNCA  Current Admission Status: inpatient   TOC needs / Dispo plan: pend surgical intervention likely will need SNF Barriers to discharge / significant pending items: angio today, pending likely amputation, expect will be here into late week             Subjective / Brief ROS:  Patient reports no concerns We discussed plan for BKA tomorrow, pt has  some specific questions which were deferred to surgical team but he is on board w/ general plan    Denies CP/SOB.  Pain controlled.    Family Communication: pt declined call to support person(s)    Objective Findings:  Vitals:   11/05/22 1707 11/05/22 1943 11/06/22 0521  11/06/22 0918  BP: (!) 146/78 138/78 (!) 141/86 127/71  Pulse: 84 84 89 86  Resp: 18 20 16 18   Temp: 99.3 F (37.4 C) 98.8 F (37.1 C) 99.3 F (37.4 C) 98.9 F (37.2 C)  TempSrc:  Oral  Oral  SpO2: 96% 99% 91% 94%  Weight:      Height:        Intake/Output Summary (Last 24 hours) at 11/06/2022 1518 Last data filed at 11/06/2022 1439 Gross per 24 hour  Intake 360 ml  Output 1150 ml  Net -790 ml   Filed Weights   11/02/22 1103  Weight: 89.4 kg    Examination:  Physical Exam Constitutional:      General: He is not in acute distress.    Appearance: Normal appearance.  Cardiovascular:     Rate and Rhythm: Normal rate and regular rhythm.  Pulmonary:     Effort: Pulmonary effort is normal.     Breath sounds: Normal breath sounds.  Abdominal:     General: Abdomen is flat.     Palpations: Abdomen is soft.  Skin:    General: Skin is warm and dry.     Comments: Bandages to lower extremities were not removed   Neurological:     General: No focal deficit present.     Mental Status: He is alert. Mental status is at baseline.  Psychiatric:        Mood and Affect: Mood normal.        Behavior: Behavior normal.          Scheduled Medications:   ARIPiprazole  10 mg Oral Daily   aspirin EC  81 mg Oral Daily   buPROPion  150 mg Oral BID   cloNIDine  0.1 mg Oral QHS   clopidogrel  75 mg Oral Daily   cyanocobalamin  1,000 mcg Oral Daily   gabapentin  400 mg Oral TID   heparin injection (subcutaneous)  5,000 Units Subcutaneous Q8H   insulin aspart  0-5 Units Subcutaneous QHS   insulin aspart  0-9 Units Subcutaneous TID WC   insulin aspart  8 Units Subcutaneous TID WC   insulin glargine-yfgn  44 Units Subcutaneous Daily   magnesium oxide  800 mg Oral BID   metoprolol succinate  50 mg Oral Daily   mometasone-formoterol  2 puff Inhalation BID   pantoprazole  40 mg Oral Daily   rosuvastatin  5 mg Oral QHS   umeclidinium bromide  1 puff Inhalation Daily   venlafaxine XR   225 mg Oral Daily    Continuous Infusions:  cefTRIAXone (ROCEPHIN)  IV 2 g (11/06/22 1222)   metronidazole 500 mg (11/06/22 1505)   vancomycin 1,000 mg (11/06/22 0929)    PRN Medications:  acetaminophen, albuterol, dextromethorphan-guaiFENesin, hydrALAZINE, HYDROmorphone (DILAUDID) injection, ondansetron (ZOFRAN) IV, oxyCODONE-acetaminophen  Antimicrobials from admission:  Anti-infectives (From admission, onward)    Start     Dose/Rate Route Frequency Ordered Stop   11/03/22 0600  vancomycin (VANCOCIN) IVPB 1000 mg/200 mL premix        1,000 mg 200 mL/hr over 60 Minutes Intravenous Every 12 hours 11/02/22 1316     11/02/22 1330  vancomycin (VANCOREADY) IVPB 750 mg/150  mL        750 mg 150 mL/hr over 60 Minutes Intravenous  Once 11/02/22 1316 11/02/22 1750   11/02/22 1300  cefTRIAXone (ROCEPHIN) 2 g in sodium chloride 0.9 % 100 mL IVPB        2 g 200 mL/hr over 30 Minutes Intravenous Every 24 hours 11/02/22 1255     11/02/22 1300  metroNIDAZOLE (FLAGYL) IVPB 500 mg        500 mg 100 mL/hr over 60 Minutes Intravenous Every 12 hours 11/02/22 1255     11/02/22 1200  vancomycin (VANCOCIN) IVPB 1000 mg/200 mL premix        1,000 mg 200 mL/hr over 60 Minutes Intravenous  Once 11/02/22 1150 11/02/22 1333           Data Reviewed:  I have personally reviewed the following...  CBC: Recent Labs  Lab 11/02/22 1121 11/03/22 0516 11/04/22 0443 11/05/22 0451 11/06/22 0532  WBC 14.8* 12.1* 11.6* 12.0* 9.8  NEUTROABS 12.3*  --   --   --   --   HGB 10.9* 9.7* 8.9* 8.9* 9.6*  HCT 33.5* 29.6* 27.1* 27.8* 29.9*  MCV 87.9 86.8 87.4 88.5 89.0  PLT 436* 318 302 333 379   Basic Metabolic Panel: Recent Labs  Lab 11/02/22 1121 11/03/22 0516 11/04/22 0443 11/05/22 0451 11/06/22 0532  NA 127* 130* 131* 133* 135  K 4.3 3.3* 3.0* 3.3* 3.5  CL 88* 98 97* 94* 97*  CO2 22 24 27 27 30   GLUCOSE 437* 231* 179* 203* 132*  BUN 20 11 7 6  <5*  CREATININE 1.05 0.75 0.70 0.82 0.74   CALCIUM 8.7* 7.6* 7.5* 7.7* 8.1*   GFR: Estimated Creatinine Clearance: 110.6 mL/min (by C-G formula based on SCr of 0.74 mg/dL). Liver Function Tests: Recent Labs  Lab 11/02/22 1121  AST 16  ALT 13  ALKPHOS 163*  BILITOT 1.0  PROT 7.7  ALBUMIN 2.8*   No results for input(s): "LIPASE", "AMYLASE" in the last 168 hours. No results for input(s): "AMMONIA" in the last 168 hours. Coagulation Profile: Recent Labs  Lab 11/02/22 1121 11/02/22 1825  INR 1.2 1.3*   Cardiac Enzymes: No results for input(s): "CKTOTAL", "CKMB", "CKMBINDEX", "TROPONINI" in the last 168 hours. BNP (last 3 results) No results for input(s): "PROBNP" in the last 8760 hours. HbA1C: No results for input(s): "HGBA1C" in the last 72 hours. CBG: Recent Labs  Lab 11/05/22 1651 11/05/22 1709 11/05/22 2055 11/06/22 0917 11/06/22 1206  GLUCAP 172* 140* 206* 155* 211*   Lipid Profile: No results for input(s): "CHOL", "HDL", "LDLCALC", "TRIG", "CHOLHDL", "LDLDIRECT" in the last 72 hours. Thyroid Function Tests: No results for input(s): "TSH", "T4TOTAL", "FREET4", "T3FREE", "THYROIDAB" in the last 72 hours. Anemia Panel: No results for input(s): "VITAMINB12", "FOLATE", "FERRITIN", "TIBC", "IRON", "RETICCTPCT" in the last 72 hours. Most Recent Urinalysis On File:     Component Value Date/Time   COLORURINE YELLOW (A) 11/02/2022 1925   APPEARANCEUR CLEAR (A) 11/02/2022 1925   APPEARANCEUR Cloudy (A) 01/13/2020 1156   LABSPEC 1.023 11/02/2022 1925   PHURINE 5.0 11/02/2022 1925   GLUCOSEU >=500 (A) 11/02/2022 1925   HGBUR SMALL (A) 11/02/2022 1925   BILIRUBINUR NEGATIVE 11/02/2022 1925   BILIRUBINUR Negative 01/13/2020 1156   KETONESUR 5 (A) 11/02/2022 1925   PROTEINUR 30 (A) 11/02/2022 1925   NITRITE NEGATIVE 11/02/2022 1925   LEUKOCYTESUR NEGATIVE 11/02/2022 1925   Sepsis Labs: @LABRCNTIP (procalcitonin:4,lacticidven:4) Microbiology: Recent Results (from the past 240 hour(s))  Culture, blood  (Routine  x 2)     Status: None (Preliminary result)   Collection Time: 11/02/22 11:16 AM   Specimen: BLOOD  Result Value Ref Range Status   Specimen Description BLOOD RIGHT ANTECUBITAL  Final   Special Requests   Final    BOTTLES DRAWN AEROBIC AND ANAEROBIC Blood Culture adequate volume   Culture   Final    NO GROWTH 4 DAYS Performed at [facility], [address], [place], [place] [address]    Report Status PENDING  Incomplete  Culture, blood (Routine x 2)     Status: None (Preliminary result)   Collection Time: 11/02/22 11:21 AM   Specimen: BLOOD  Result Value Ref Range Status   Specimen Description BLOOD BLOOD RIGHT ARM  Final   Special Requests   Final    BOTTLES DRAWN AEROBIC AND ANAEROBIC Blood Culture adequate volume   Culture   Final    NO GROWTH 4 DAYS Performed at [facility], [address] [place], [place] [address]    Report Status PENDING  Incomplete      Radiology Studies last 3 days: PERIPHERAL VASCULAR CATHETERIZATION  Result Date: 11/05/2022 See surgical note for result.            LOS: 4 days    Sunnie Nielsen, DO [facility] 11/06/2022, 3:18 PM    Dictation software may have been used to generate the above note. Typos may occur and escape review in typed/dictated notes. Please contact Dr Lyn Hollingshead directly for clarity if needed.  Staff may message me via secure chat in Epic  but this may not receive an immediate response,  please page me for urgent matters!  If 7PM-7AM, please contact night coverage [email or web address]

## 2022-11-07 ENCOUNTER — Other Ambulatory Visit: Payer: Self-pay

## 2022-11-07 ENCOUNTER — Encounter
Admission: EM | Disposition: A | Discharge status: Home / Self Care | Payer: Self-pay | Source: Home / Self Care | Attending: Internal Medicine

## 2022-11-07 ENCOUNTER — Inpatient Hospital Stay: Payer: Medicaid Other | Admitting: Certified Registered"

## 2022-11-07 ENCOUNTER — Encounter: Payer: Self-pay | Admitting: Internal Medicine

## 2022-11-07 DIAGNOSIS — M869 Osteomyelitis, unspecified: Secondary | ICD-10-CM | POA: Diagnosis not present

## 2022-11-07 DIAGNOSIS — Z01818 Encounter for other preprocedural examination: Secondary | ICD-10-CM | POA: Diagnosis not present

## 2022-11-07 HISTORY — PX: AMPUTATION: SHX166

## 2022-11-07 LAB — GLUCOSE, CAPILLARY
Glucose-Capillary: 188 mg/dL — ABNORMAL HIGH (ref 70–99)
Glucose-Capillary: 165 mg/dL — ABNORMAL HIGH (ref 70–99)
Glucose-Capillary: 183 mg/dL — ABNORMAL HIGH (ref 70–99)
Glucose-Capillary: 258 mg/dL — ABNORMAL HIGH (ref 70–99)
Glucose-Capillary: 149 mg/dL — ABNORMAL HIGH (ref 70–99)
Glucose-Capillary: 135 mg/dL — ABNORMAL HIGH (ref 70–99)
Glucose-Capillary: 119 mg/dL — ABNORMAL HIGH (ref 70–99)

## 2022-11-07 LAB — CULTURE, BLOOD (ROUTINE X 2)

## 2022-11-07 SURGERY — AMPUTATION BELOW KNEE
Anesthesia: General | Site: Leg Lower | Laterality: Right

## 2022-11-07 MED ORDER — NICOTINE 21 MG/24HR TD PT24
21.0000 mg | MEDICATED_PATCH | Freq: Every day | TRANSDERMAL | Status: DC
  Administered 2022-11-07 – 2022-11-24 (×18): 21 mg via TRANSDERMAL
  Filled 2022-11-07 (×18): qty 1

## 2022-11-07 MED ORDER — SODIUM CHLORIDE 0.9 % IV SOLN: INTRAVENOUS | Status: DC

## 2022-11-07 MED ORDER — ROCURONIUM BROMIDE 10 MG/ML (PF) SYRINGE
PREFILLED_SYRINGE | INTRAVENOUS | Status: AC
  Filled 2022-11-07: qty 10

## 2022-11-07 MED ORDER — OXYCODONE HCL 5 MG PO TABS
5.0000 mg | ORAL_TABLET | Freq: Once | ORAL | Status: AC | PRN
  Administered 2022-11-07: 5 mg via ORAL

## 2022-11-07 MED ORDER — FENTANYL CITRATE (PF) 100 MCG/2ML IJ SOLN
INTRAMUSCULAR | Status: AC
  Filled 2022-11-07: qty 2

## 2022-11-07 MED ORDER — LIDOCAINE HCL (PF) 2 % IJ SOLN
INTRAMUSCULAR | Status: AC
  Filled 2022-11-07: qty 5

## 2022-11-07 MED ORDER — OXYCODONE HCL 5 MG PO TABS
ORAL_TABLET | ORAL | Status: AC
  Filled 2022-11-07: qty 1

## 2022-11-07 MED ORDER — ORAL CARE MOUTH RINSE: 15.0000 mL | Freq: Once | OROMUCOSAL | Status: AC

## 2022-11-07 MED ORDER — ROCURONIUM BROMIDE 100 MG/10ML IV SOLN
INTRAVENOUS | Status: DC | PRN
  Administered 2022-11-07: 30 mg via INTRAVENOUS
  Administered 2022-11-07: 10 mg via INTRAVENOUS

## 2022-11-07 MED ORDER — EPHEDRINE 5 MG/ML INJ
INTRAVENOUS | Status: AC
  Filled 2022-11-07: qty 5

## 2022-11-07 MED ORDER — ONDANSETRON HCL 4 MG/2ML IJ SOLN: 4.0000 mg | Freq: Once | INTRAMUSCULAR | Status: DC | PRN

## 2022-11-07 MED ORDER — HEPARIN SODIUM (PORCINE) 5000 UNIT/ML IJ SOLN
INTRAMUSCULAR | Status: AC
  Filled 2022-11-07: qty 1

## 2022-11-07 MED ORDER — SUCCINYLCHOLINE CHLORIDE 200 MG/10ML IV SOSY
PREFILLED_SYRINGE | INTRAVENOUS | Status: DC | PRN
  Administered 2022-11-07: 120 mg via INTRAVENOUS

## 2022-11-07 MED ORDER — ONDANSETRON HCL 4 MG/2ML IJ SOLN
INTRAMUSCULAR | Status: AC
  Filled 2022-11-07: qty 2

## 2022-11-07 MED ORDER — ACETAMINOPHEN 10 MG/ML IV SOLN
INTRAVENOUS | Status: AC
  Filled 2022-11-07: qty 100

## 2022-11-07 MED ORDER — SODIUM CHLORIDE 0.9 % IR SOLN
Status: DC | PRN
  Administered 2022-11-07: 501 mL

## 2022-11-07 MED ORDER — DEXAMETHASONE SODIUM PHOSPHATE 10 MG/ML IJ SOLN
INTRAMUSCULAR | Status: AC
  Filled 2022-11-07: qty 1

## 2022-11-07 MED ORDER — PHENYLEPHRINE HCL (PRESSORS) 10 MG/ML IV SOLN
INTRAVENOUS | Status: DC | PRN
  Administered 2022-11-07: 100 ug via INTRAVENOUS

## 2022-11-07 MED ORDER — OXYCODONE HCL 5 MG/5ML PO SOLN: 5.0000 mg | Freq: Once | ORAL | Status: AC | PRN

## 2022-11-07 MED ORDER — KETAMINE HCL 50 MG/5ML IJ SOSY
PREFILLED_SYRINGE | INTRAMUSCULAR | Status: AC
  Filled 2022-11-07: qty 5

## 2022-11-07 MED ORDER — FENTANYL CITRATE (PF) 100 MCG/2ML IJ SOLN
INTRAMUSCULAR | Status: DC | PRN
  Administered 2022-11-07: 25 ug via INTRAVENOUS
  Administered 2022-11-07: 50 ug via INTRAVENOUS
  Administered 2022-11-07: 25 ug via INTRAVENOUS

## 2022-11-07 MED ORDER — ACETAMINOPHEN 10 MG/ML IV SOLN
1000.0000 mg | Freq: Once | INTRAVENOUS | Status: DC | PRN
  Administered 2022-11-07: 1000 mg via INTRAVENOUS

## 2022-11-07 MED ORDER — HYDROMORPHONE HCL 1 MG/ML IJ SOLN
1.0000 mg | INTRAMUSCULAR | Status: DC | PRN
  Administered 2022-11-07 – 2022-11-24 (×24): 1 mg via INTRAVENOUS
  Filled 2022-11-07 (×25): qty 1

## 2022-11-07 MED ORDER — HYDROMORPHONE HCL 1 MG/ML IJ SOLN
INTRAMUSCULAR | Status: AC
  Filled 2022-11-07: qty 1

## 2022-11-07 MED ORDER — CHLORHEXIDINE GLUCONATE 0.12 % MT SOLN
15.0000 mL | Freq: Once | OROMUCOSAL | Status: AC
  Administered 2022-11-07: 15 mL via OROMUCOSAL

## 2022-11-07 MED ORDER — FENTANYL CITRATE (PF) 100 MCG/2ML IJ SOLN
25.0000 ug | INTRAMUSCULAR | Status: DC | PRN
  Administered 2022-11-07 (×3): 50 ug via INTRAVENOUS

## 2022-11-07 MED ORDER — HYDROMORPHONE HCL 1 MG/ML IJ SOLN
0.5000 mg | INTRAMUSCULAR | Status: DC | PRN
  Administered 2022-11-07: 0.5 mg via INTRAVENOUS

## 2022-11-07 MED ORDER — PROPOFOL 10 MG/ML IV BOLUS
INTRAVENOUS | Status: DC | PRN
  Administered 2022-11-07: 100 mg via INTRAVENOUS

## 2022-11-07 MED ORDER — MIDAZOLAM HCL 2 MG/2ML IJ SOLN
INTRAMUSCULAR | Status: DC | PRN
  Administered 2022-11-07: 2 mg via INTRAVENOUS

## 2022-11-07 MED ORDER — PHENYLEPHRINE 80 MCG/ML (10ML) SYRINGE FOR IV PUSH (FOR BLOOD PRESSURE SUPPORT)
PREFILLED_SYRINGE | INTRAVENOUS | Status: AC
  Filled 2022-11-07: qty 10

## 2022-11-07 MED ORDER — LACTATED RINGERS IV SOLN: INTRAVENOUS | Status: DC

## 2022-11-07 MED ORDER — ONDANSETRON HCL 4 MG/2ML IJ SOLN: 4.0000 mg | Freq: Four times a day (QID) | INTRAMUSCULAR | Status: DC | PRN

## 2022-11-07 MED ORDER — LIDOCAINE HCL (CARDIAC) PF 100 MG/5ML IV SOSY
PREFILLED_SYRINGE | INTRAVENOUS | Status: DC | PRN
  Administered 2022-11-07: 60 mg via INTRAVENOUS

## 2022-11-07 MED ORDER — EPHEDRINE SULFATE (PRESSORS) 50 MG/ML IJ SOLN
INTRAMUSCULAR | Status: DC | PRN
  Administered 2022-11-07: 5 mg via INTRAVENOUS

## 2022-11-07 MED ORDER — PROPOFOL 10 MG/ML IV BOLUS
INTRAVENOUS | Status: AC
  Filled 2022-11-07: qty 20

## 2022-11-07 MED ORDER — HYDROMORPHONE HCL 1 MG/ML IJ SOLN
1.0000 mg | Freq: Once | INTRAMUSCULAR | Status: DC | PRN
  Filled 2022-11-07: qty 1

## 2022-11-07 MED ORDER — ONDANSETRON HCL 4 MG/2ML IJ SOLN
INTRAMUSCULAR | Status: DC | PRN
  Administered 2022-11-07: 4 mg via INTRAVENOUS

## 2022-11-07 MED ORDER — CHLORHEXIDINE GLUCONATE 0.12 % MT SOLN
OROMUCOSAL | Status: AC
  Filled 2022-11-07: qty 15

## 2022-11-07 MED ORDER — KETAMINE HCL 10 MG/ML IJ SOLN
INTRAMUSCULAR | Status: DC | PRN
  Administered 2022-11-07: 20 mg via INTRAVENOUS
  Administered 2022-11-07: 30 mg via INTRAVENOUS

## 2022-11-07 MED ORDER — SUGAMMADEX SODIUM 200 MG/2ML IV SOLN
INTRAVENOUS | Status: DC | PRN
  Administered 2022-11-07: 200 mg via INTRAVENOUS

## 2022-11-07 MED ORDER — MIDAZOLAM HCL 2 MG/2ML IJ SOLN
INTRAMUSCULAR | Status: AC
  Filled 2022-11-07: qty 2

## 2022-11-07 MED ORDER — DEXAMETHASONE SODIUM PHOSPHATE 10 MG/ML IJ SOLN
INTRAMUSCULAR | Status: DC | PRN
  Administered 2022-11-07: 5 mg via INTRAVENOUS

## 2022-11-07 SURGICAL SUPPLY — 41 items
APL PRP STRL LF DISP 70% ISPRP (MISCELLANEOUS) ×1
BLADE SAW SAG 25.4X90 (BLADE) ×1 IMPLANT
BNDG CMPR 5X4 CHSV STRCH STRL (GAUZE/BANDAGES/DRESSINGS) ×1
BNDG CMPR STD VLCR NS LF 5.8X6 (GAUZE/BANDAGES/DRESSINGS) ×2
BNDG COHESIVE 4X5 TAN STRL LF (GAUZE/BANDAGES/DRESSINGS) ×1 IMPLANT
BNDG ELASTIC 6X5.8 VLCR NS LF (GAUZE/BANDAGES/DRESSINGS) ×1 IMPLANT
BNDG GAUZE DERMACEA FLUFF 4 (GAUZE/BANDAGES/DRESSINGS) ×2 IMPLANT
BNDG GZE DERMACEA 4 6PLY (GAUZE/BANDAGES/DRESSINGS) ×1
BRUSH SCRUB EZ  4% CHG (MISCELLANEOUS) ×1
BRUSH SCRUB EZ 4% CHG (MISCELLANEOUS) ×1 IMPLANT
CHLORAPREP W/TINT 26 (MISCELLANEOUS) ×1 IMPLANT
ELECT CAUTERY BLADE 6.4 (BLADE) ×1 IMPLANT
ELECT REM PT RETURN 9FT ADLT (ELECTROSURGICAL) ×1
ELECTRODE REM PT RTRN 9FT ADLT (ELECTROSURGICAL) ×1 IMPLANT
GAUZE XEROFORM 1X8 LF (GAUZE/BANDAGES/DRESSINGS) ×2 IMPLANT
GLOVE BIO SURGEON STRL SZ7 (GLOVE) ×2 IMPLANT
GOWN STRL REUS W/ TWL LRG LVL3 (GOWN DISPOSABLE) ×2 IMPLANT
GOWN STRL REUS W/ TWL XL LVL3 (GOWN DISPOSABLE) ×1 IMPLANT
GOWN STRL REUS W/TWL LRG LVL3 (GOWN DISPOSABLE) ×2
GOWN STRL REUS W/TWL XL LVL3 (GOWN DISPOSABLE) ×1
HANDLE YANKAUER SUCT BULB TIP (MISCELLANEOUS) ×1 IMPLANT
KIT TURNOVER KIT A (KITS) ×1 IMPLANT
MANIFOLD NEPTUNE II (INSTRUMENTS) ×1 IMPLANT
MAT ABSORB  FLUID 56X50 GRAY (MISCELLANEOUS) ×1
MAT ABSORB FLUID 56X50 GRAY (MISCELLANEOUS) ×1 IMPLANT
PACK EXTREMITY ARMC (MISCELLANEOUS) ×1 IMPLANT
PAD ABD DERMACEA PRESS 5X9 (GAUZE/BANDAGES/DRESSINGS) ×2 IMPLANT
PAD PREP OB/GYN DISP 24X41 (PERSONAL CARE ITEMS) ×1 IMPLANT
SPONGE T-LAP 18X18 ~~LOC~~+RFID (SPONGE) ×1 IMPLANT
SPONGE T-LAP 4X18 ~~LOC~~+RFID (SPONGE) IMPLANT
STAPLER SKIN PROX 35W (STAPLE) ×1 IMPLANT
STOCKINETTE M/LG 89821 (MISCELLANEOUS) ×1 IMPLANT
SUT SILK 2 0 (SUTURE) ×1
SUT SILK 2 0 SH (SUTURE) ×2 IMPLANT
SUT SILK 2-0 18XBRD TIE 12 (SUTURE) ×1 IMPLANT
SUT SILK 3 0 (SUTURE) ×1
SUT SILK 3-0 18XBRD TIE 12 (SUTURE) ×1 IMPLANT
SUT VIC AB 0 CT1 36 (SUTURE) ×2 IMPLANT
SUT VIC AB 2-0 CT1 (SUTURE) ×2 IMPLANT
TRAP FLUID SMOKE EVACUATOR (MISCELLANEOUS) ×1 IMPLANT
WATER STERILE IRR 500ML POUR (IV SOLUTION) ×1 IMPLANT

## 2022-11-07 NOTE — Op Note (Signed)
   OPERATIVE NOTE   PROCEDURE: Right below-the-knee amputation  PRE-OPERATIVE DIAGNOSIS: Right foot osteomyelitis  POST-OPERATIVE DIAGNOSIS: same as above  SURGEON: Festus Barren, MD  ASSISTANT(S): none  ANESTHESIA: general  ESTIMATED BLOOD LOSS: 100 cc  FINDING(S): none  SPECIMEN(S):  Right below-the-knee amputation  INDICATIONS:   Timothy [name]. is a 62 y.o. male who presents with right foot osteomyelitis with tissue destruction precluding limb salvage.  The patient is scheduled for a right below-the-knee amputation.  I discussed in depth with the patient the risks, benefits, and alternatives to this procedure.  The patient is aware that the risk of this operation included but are not limited to:  bleeding, infection, myocardial infarction, stroke, death, failure to heal amputation wound, and possible need for more proximal amputation.  The patient is aware of the risks and agrees proceed forward with the procedure.  DESCRIPTION:  After full informed written consent was obtained from the patient, the patient was brought back to the operating room, and placed supine upon the operating table.  Prior to induction, the patient received IV antibiotics.  The patient was then prepped and draped in the standard fashion for a below-the-knee amputation.  After obtaining adequate anesthesia, the patient was prepped and draped in the standard fashion for a right below-the-knee amputation.  I marked out the anterior incision two finger breadths below the tibial tuberosity and then the marked out a posterior flap that was one third of the circumference of the calf in length.   I made the incisions for these flaps, and then dissected through the subcutaneous tissue, fascia, and muscle anteriorly.  I elevated  the periosteal tissue superiorly so that the tibia was about 3-4 cm shorter than the anterior skin flap.  I then transected the tibia with a power saw and then took a wedge off the tibia  anteriorly with the power saw.  Then I smoothed out the rough edges.  In a similar fashion, I cut back the fibula about two centimeters higher than the level of the tibia with a bone cutter.  I put a bone hook into the distal tibia and then used a large amputation knife to sharply develop a tissue plane through the muscle along the fibula.  In such fashion, the posterior flap was developed.  At this point, the specimen was passed off the field as the below-the-knee amputation.  At this point, I clamped all visibly bleeding arteries and veins using a combination of suture ligation with Silk suture and electrocautery.  Bleeding continued to be controlled with electrocautery and suture ligature.  The stump was washed off with sterile normal saline and no further active bleeding was noted.  I reapproximated the anterior and posterior fascia  with interrupted stitches of 0 Vicryl.  This was completed along the entire length of anterior and posterior fascia until there were no more loose space in the fascial line. I then placed a layer of 2-0 Vicryl sutures in the subcutaneous tissue. The skin was then  reapproximated with staples.  The stump was washed off and dried.  The incision was dressed with Xeroform and  then fluffs were applied.  Kerlix was wrapped around the leg and then gently an ACE wrap was applied.    COMPLICATIONS: none  CONDITION: stable   Festus Barren  11/07/2022, 9:01 AM    This note was created with Dragon Medical transcription system. Any errors in dictation are purely unintentional.

## 2022-11-07 NOTE — Anesthesia Procedure Notes (Signed)
Procedure Name: Intubation Date/Time: 11/07/2022 7:49 AM  Performed by: Monico Hoar, CRNAPre-anesthesia Checklist: Patient identified, Patient being monitored, Timeout performed, Emergency Drugs available and Suction available Patient Re-evaluated:Patient Re-evaluated prior to induction Oxygen Delivery Method: Circle system utilized Preoxygenation: Pre-oxygenation with 100% oxygen Induction Type: IV induction Ventilation: Mask ventilation without difficulty Laryngoscope Size: Mac and 4 Grade View: Grade I Tube type: Oral Tube size: 7.5 mm Number of attempts: 1 Airway Equipment and Method: Stylet Placement Confirmation: ETT inserted through vocal cords under direct vision, positive ETCO2 and breath sounds checked- equal and bilateral Secured at: 21 cm Tube secured with: Tape Dental Injury: Teeth and Oropharynx as per pre-operative assessment

## 2022-11-07 NOTE — Anesthesia Preprocedure Evaluation (Addendum)
Anesthesia Evaluation  Patient identified by MRN, date of birth, ID band Patient awake    Reviewed: Allergy & Precautions, NPO status , Patient's Chart, lab work & pertinent test results  History of Anesthesia Complications Negative for: history of anesthetic complications  Airway Mallampati: IV   Neck ROM: Full    Dental  (+) Missing, Chipped   Pulmonary COPD (on 3L home O2), Current Smoker (1/2 ppd) and Patient abstained from smoking. Hx empyema s/p VATS   Pulmonary exam normal breath sounds clear to auscultation       Cardiovascular hypertension, + CAD, + Peripheral Vascular Disease and +CHF (diastolic)  Normal cardiovascular exam Rhythm:Regular Rate:Normal  Hx DVT on Eliquis  ECG 11/02/22: Sinus rhythm Right bundle branch block Baseline wander in lead(s) V3 V6   Neuro/Psych  PSYCHIATRIC DISORDERS (PTSD) Anxiety Depression Bipolar Disorder    Neuromuscular disease (peripheral neuropathy)    GI/Hepatic ,GERD  ,,  Endo/Other  diabetes, Poorly Controlled, Type 2    Renal/GU negative Renal ROS     Musculoskeletal  (+) Arthritis ,    Abdominal   Peds  Hematology  (+) Blood dyscrasia, anemia   Anesthesia Other Findings   Reproductive/Obstetrics                             Anesthesia Physical Anesthesia Plan  ASA: 4  Anesthesia Plan: General   Post-op Pain Management:    Induction: Intravenous  PONV Risk Score and Plan: 1 and Ondansetron, Dexamethasone and Treatment may vary due to age or medical condition  Airway Management Planned: Oral ETT  Additional Equipment:   Intra-op Plan:   Post-operative Plan: Extubation in OR  Informed Consent: I have reviewed the patients History and Physical, chart, labs and discussed the procedure including the risks, benefits and alternatives for the proposed anesthesia with the patient or authorized representative who has indicated his/her  understanding and acceptance.     Dental advisory given  Plan Discussed with: CRNA  Anesthesia Plan Comments: (Patient consented for risks of anesthesia including but not limited to:  - adverse reactions to medications - damage to eyes, teeth, lips or other oral mucosa - nerve damage due to positioning  - sore throat or hoarseness - damage to heart, brain, nerves, lungs, other parts of body or loss of life  Informed patient about role of CRNA in peri- and intra-operative care.  Patient voiced understanding.)        Anesthesia Quick Evaluation

## 2022-11-07 NOTE — Anesthesia Postprocedure Evaluation (Signed)
Anesthesia Post Note  Patient: Timothy [name].  Procedure(s) Performed: AMPUTATION BELOW KNEE (Right: Leg Lower)  Patient location during evaluation: PACU Anesthesia Type: General Level of consciousness: awake and alert, oriented and patient cooperative Pain management: pain level controlled Vital Signs Assessment: post-procedure vital signs reviewed and stable Respiratory status: spontaneous breathing, nonlabored ventilation and respiratory function stable Cardiovascular status: blood pressure returned to baseline and stable Postop Assessment: adequate PO intake Anesthetic complications: no   No notable events documented.   Last Vitals:  Vitals:   11/07/22 0945 11/07/22 1026  BP: (!) 156/89 (!) 161/93  Pulse: 89 85  Resp: 15   Temp:  37 C  SpO2: 96% 99%    Last Pain:  Vitals:   11/07/22 1026  TempSrc: Oral  PainSc: 10-Worst pain ever                 Reed Breech

## 2022-11-07 NOTE — Interval H&P Note (Signed)
History and Physical Interval Note:  11/07/2022 7:25 AM  Timothy [name].  has presented today for surgery, with the diagnosis of Right Below Knee Amputation.  The various methods of treatment have been discussed with the patient and family. After consideration of risks, benefits and other options for treatment, the patient has consented to  Procedure(s): AMPUTATION BELOW KNEE (Right) as a surgical intervention.  The patient's history has been reviewed, patient examined, no change in status, stable for surgery.  I have reviewed the patient's chart and labs.  Questions were answered to the patient's satisfaction.     Festus Barren

## 2022-11-07 NOTE — Transfer of Care (Signed)
Immediate Anesthesia Transfer of Care Note  Patient: Timothy [name].  Procedure(s) Performed: AMPUTATION BELOW KNEE (Right: Knee)  Patient Location: PACU  Anesthesia Type:General  Level of Consciousness: awake  Airway & Oxygen Therapy: Patient Spontanous Breathing and Patient connected to face mask oxygen  Post-op Assessment: Report given to RN and Post -op Vital signs reviewed and stable  Post vital signs: Reviewed and stable  Last Vitals:  Vitals Value Taken Time  BP 167/98 11/07/22 0858  Temp    Pulse 89 11/07/22 0859  Resp 18 11/07/22 0859  SpO2 99 % 11/07/22 0859  Vitals shown include unvalidated device data.  Last Pain:  Vitals:   11/07/22 0644  TempSrc: Temporal  PainSc: 7       Patients Stated Pain Goal: 0 (11/05/22 1816)  Complications: No notable events documented.

## 2022-11-07 NOTE — Progress Notes (Addendum)
Progress Note   Patient: Timothy [name]. ZOX:[id number] DOB: 09/24/1960 DOA: 11/02/2022     5 DOS: the patient was seen and examined on 11/07/2022    Subjective:   Patient seen and examined at bedside this afternoon He did complain of pain at the surgical site Denies nausea vomiting chest pain abdominal pain or cough And was seen after amputation of the right leg below the knee   Brief Narrative / Hospital Course:  Timothy Sabins. is a 62 y.o. male with medical history significant of hypertension, hyperlipidemia, diabetes mellitus, COPD on 2-3 L oxygen (2 L in the daytime, and 3L in night), diastolic CHF, depression with anxiety, PTSD, bipolar, DVT on Eliquis, who presents from podiatry office with bilateral foot ulcer and pain, unresponsive to outpatient abx.  Patient has a large and deep ulcer in right heel  and a small ulcer in the left plantar area.  05/17: MRI (+) osteomyelitis R calcaneus. Podiatry recs for BKA. Vasc surgery planning for RLE angio on Monday.  05/18-05/19 (weekend): stable 05/20: RLE angiography w/ angioplasty today w/ Dr Wyn Quaker 05/21: awaiting amputation tomorrow    Consultants:  Podiatry Vascular Surgery    Procedures: 11/05/2022 RLE angiography w/ angioplasty           ASSESSMENT & PLAN:   Principal Problem:   Osteomyelitis of right foot (HCC) Active Problems:   Sepsis (HCC)   Type 2 diabetes mellitus with diabetic neuropathy, unspecified (HCC)   Essential hypertension   Chronic diastolic CHF (congestive heart failure) (HCC)   COPD (chronic obstructive pulmonary disease) (HCC)   HLD (hyperlipidemia)   DVT (deep venous thrombosis) (HCC)   Current smoker   Depression with anxiety   PAD (peripheral artery disease) (HCC)   Sepsis due to osteomyelitis of right calcaneus and R diabetic ulcer present on admission  L foot stage 3 diabetic/pressure ulcer present on admission Patient meets criteria for sepsis with WBC 14.8 and, heart rate 99,  lactic acid normal 1.3. vancomycin, Flagyl, Rocephin PRN Zofran for nausea tylenol, Percocet for pain Blood cultures x 2 wound care consult BKA vascular surgeon done on 11/07/2022   Type 2 diabetes mellitus with diabetic neuropathy, unspecified (HCC): Patient has blood sugar 437, anion gap 17, hydroxybutyric acid 2.55, but bicarbonate is 22, pH 7.43 on VBG, patient may be on early stage of developing DKA on admission SSI Glargine insulin 44 units daily   Hypokalemia Replace as needed Monitor BMP   Essential hypertension IV hydralazine as needed held amlodipine and metoprolol since patient at risk of developing hypotension but BP has improved, restarted metoprolol  Continue home clonidine   Chronic diastolic CHF (congestive heart failure) (HCC):  2D echo on 06/07/2021 showed EF of 55 to 60% with grade 1 diastolic dysfunction.  Patient does not have leg edema JVD.  CHF is compensated.  BNP normal 72.6 Watch volume status closely   COPD (chronic obstructive pulmonary disease) (HCC): Stable Bronchodilators   HLD (hyperlipidemia) Crestor   History of DVT (deep venous thrombosis) (HCC) in March 2022 Used to be on elquis but according to patient he is no longer on eliquis. Records show repeat ultrasound in Dec 2022 showed resolution of DVT   Current smoker Nicotine patch   Depression with anxiety Continue home medications   PAD (peripheral artery disease) (HCC) s/p  RLE angiogram 11/05/2022 Statin, Plavix      DVT prophylaxis: heparin     Current Admission Status: inpatient    TOC needs / Dispo  plan: pend surgical intervention likely will need SNF  Barriers to discharge / significant pending items: After clearance by vascular surgeon        Physical Exam: Constitutional:      General: He is not in acute distress.    Appearance: Normal appearance.  Cardiovascular:     Rate and Rhythm: Normal rate and regular rhythm.  Pulmonary:     Effort: Pulmonary effort is  normal.     Breath sounds: Normal breath sounds.  Abdominal:     General: Abdomen is flat.     Palpations: Abdomen is soft.  Musculoskeletal: Right below-knee amputation stump noted  Neurological:     General: No focal deficit present.     Mental Status: He is alert. Mental status is at baseline.  Psychiatric:        Mood and Affect: Mood normal.        Behavior: Behavior normal.     Vitals:   11/07/22 0945 11/07/22 1026 11/07/22 1119 11/07/22 1739  BP: (!) 156/89 (!) 161/93 (!) 147/86 136/85  Pulse: 89 85 84 78  Resp: 15  16 16   Temp:  98.6 F (37 C) 98.5 F (36.9 C) 98.2 F (36.8 C)  TempSrc:  Oral Oral Oral  SpO2: 96% 99% 96% 96%  Weight:      Height:        Data Reviewed: I have reviewed patient's laboratory results showing glucose 149  Time spent: 45 minutes  Author: Loyce Dys, MD 11/07/2022 6:50 PM  For on call review [email or web address].

## 2022-11-08 ENCOUNTER — Encounter: Payer: Self-pay | Admitting: Vascular Surgery

## 2022-11-08 DIAGNOSIS — M869 Osteomyelitis, unspecified: Secondary | ICD-10-CM | POA: Diagnosis not present

## 2022-11-08 LAB — BASIC METABOLIC PANEL
Anion gap: 10 (ref 5–15)
BUN: 10 mg/dL (ref 6–20)
CO2: 27 mmol/L (ref 22–32)
Calcium: 8.2 mg/dL — ABNORMAL LOW (ref 8.9–10.3)
Chloride: 97 mmol/L — ABNORMAL LOW (ref 98–111)
Creatinine, Ser: 0.73 mg/dL (ref 0.61–1.24)
GFR, Estimated: >60 mL/min (ref >60)
Glucose, Bld: 120 mg/dL — ABNORMAL HIGH (ref 70–99)
Potassium: 4.1 mmol/L (ref 3.5–5.1)
Sodium: 134 mmol/L — ABNORMAL LOW (ref 135–145)

## 2022-11-08 LAB — CBC
HCT: 31.3 % — ABNORMAL LOW (ref 39.0–52.0)
Hemoglobin: 9.7 g/dL — ABNORMAL LOW (ref 13.0–17.0)
MCH: 28 pg (ref 26.0–34.0)
MCHC: 31 g/dL (ref 30.0–36.0)
MCV: 90.2 fL (ref 80.0–100.0)
Platelets: 517 10*3/uL — ABNORMAL HIGH (ref 150–400)
RBC: 3.47 MIL/uL — ABNORMAL LOW (ref 4.22–5.81)
RDW: 13 % (ref 11.5–15.5)
WBC: 11.6 10*3/uL — ABNORMAL HIGH (ref 4.0–10.5)
nRBC: 0 % (ref 0.0–0.2)

## 2022-11-08 LAB — GLUCOSE, CAPILLARY
Glucose-Capillary: 111 mg/dL — ABNORMAL HIGH (ref 70–99)
Glucose-Capillary: 202 mg/dL — ABNORMAL HIGH (ref 70–99)
Glucose-Capillary: 95 mg/dL (ref 70–99)
Glucose-Capillary: 105 mg/dL — ABNORMAL HIGH (ref 70–99)
Glucose-Capillary: 174 mg/dL — ABNORMAL HIGH (ref 70–99)

## 2022-11-08 NOTE — Evaluation (Signed)
Occupational Therapy Evaluation Patient Details Name: Timothy [name]. MRN: [id number] DOB: 04-Jan-1961 Today's Date: 11/08/2022   History of Present Illness Pt admitted for osteomyelitis of R foot and is s/p R BKA on 11/07/22.  History includes HTN, HLD, DM, COPD on 2-3L of O2, CHF, depression, PTSD, and bipolar. Currently on RA at this time at 95%.   Clinical Impression   Patient received for OT evaluation. See flowsheet below for details of function. Generally, patient MOD (I) for bed mobility, SBA with cues for functional scoot t/f to chair, and set up-MIN A for ADLs. Patient will benefit from continued OT while in acute care.       Recommendations for follow up therapy are one component of a multi-disciplinary discharge planning process, led by the attending physician.  Recommendations may be updated based on patient status, additional functional criteria and insurance authorization.   Assistance Recommended at Discharge Intermittent Supervision/Assistance  Patient can return home with the following A little help with walking and/or transfers;A little help with bathing/dressing/bathroom;Assistance with cooking/housework;Direct supervision/assist for medications management;Assist for transportation;Help with stairs or ramp for entrance    Functional Status Assessment  Patient has had a recent decline in their functional status and demonstrates the ability to make significant improvements in function in a reasonable and predictable amount of time.  Equipment Recommendations  Other (comment) (defer to next venue of care)    Recommendations for Other Services       Precautions / Restrictions Precautions Precautions: Fall Restrictions Weight Bearing Restrictions: Yes RLE Weight Bearing: Non weight bearing      Mobility Bed Mobility Overal bed mobility: Independent             General bed mobility comments: increased time; no physical assist    Transfers Overall  transfer level: Needs assistance Equipment used: None (scoot transfer) Transfers: Bed to chair/wheelchair/BSC            Lateral/Scoot Transfers: Supervision General transfer comment: scoot to the R into recliner without physical assist of OT; short rest breaks and OT cues for safety/technique      Balance Overall balance assessment: History of Falls Sitting-balance support: Feet supported Sitting balance-Leahy Scale: Good                                     ADL either performed or assessed with clinical judgement   ADL Overall ADL's : Needs assistance/impaired Eating/Feeding: Set up;Sitting Eating/Feeding Details (indicate cue type and reason): seated EOB for eating lunch during session; L foot on floor   Grooming Details (indicate cue type and reason): anticipate set up from seated only   Upper Body Bathing Details (indicate cue type and reason): anticipate set up from seated only   Lower Body Bathing Details (indicate cue type and reason): anticipate set up from seated only.   Upper Body Dressing Details (indicate cue type and reason): anticipate set up from seated only Lower Body Dressing: Set up;Sitting/lateral leans Lower Body Dressing Details (indicate cue type and reason): Pt set up for donning L sock today at EOB; leaning posteriorly during task (figure four) but no loss of balance. Anticipate higher level of assist for donning pants   Toilet Transfer Details (indicate cue type and reason): not tested   Toileting - Clothing Manipulation Details (indicate cue type and reason): not tested today   Tub/Shower Transfer Details (indicate cue type and reason): not tested today  General ADL Comments: UE functional throughout session     Vision Baseline Vision/History: 1 Wears glasses (glasses for reading only; does not have them with him in hospital room) Ability to See in Adequate Light: 0 Adequate Patient Visual Report: No change from baseline        Perception     Praxis      Pertinent Vitals/Pain Pain Assessment Pain Assessment: 0-10 Pain Score:  (intermittently high) Pain Location: RLE, R "foot" (phantom pain) Pain Descriptors / Indicators: Operative site guarding, Shooting, Grimacing Pain Intervention(s): Limited activity within patient's tolerance, Monitored during session     Hand Dominance Right   Extremity/Trunk Assessment Upper Extremity Assessment Upper Extremity Assessment: Generalized weakness;Overall WFL for tasks assessed (Pt able to use BIL UE functionally today, including scooting to chair)   Lower Extremity Assessment Lower Extremity Assessment: Generalized weakness       Communication Communication Communication: No difficulties   Cognition Arousal/Alertness: Awake/alert Behavior During Therapy: WFL for tasks assessed/performed, Flat affect Overall Cognitive Status: Within Functional Limits for tasks assessed                                 General Comments: Pt with slightly slowed responses, may have just woken up. Extremely flat affect throughout session.     General Comments  Pt on room air throughout session.    Exercises     Shoulder Instructions      Home Living Family/patient expects to be discharged to:: Private residence Living Arrangements: Non-relatives/Friends (two roommates, Alinda Money and [place]) Available Help at Discharge: Friend(s) Type of Home: House Home Access: Stairs to enter [employer] of Steps: 2 Entrance Stairs-Rails: None Home Layout: Two level;Bed/bath upstairs (pt's bed/bath typically upstairs, but has been staying downstairs the last few weeks while a roommate was away; pt not sure if roommate is back or not) Alternate Level Stairs-Number of Steps: 11   Bathroom Shower/Tub: Chief Strategy Officer: Standard     Home Equipment: Cane - single Librarian, academic (2 wheels)   Additional Comments: Pt's room is on 2nd floor, but  recently one of his roommates has been away, so he's been able to stay on the first floor. However, that roommate may be returning.      Prior Functioning/Environment Prior Level of Function : History of Falls (last six months);Independent/Modified Independent             Mobility Comments: reports he was indep with [facility], however report 5-7 falls recently. ADLs Comments: Pt typically cooks dinner for roommates as part of an agreement, but states that past few weeks has been unable to do that. Does not drive Alinda Money- roommate- drives to grocery store). Typically (I) for ADLs, but lots of recent falls.        OT Problem List: Decreased activity tolerance;Impaired balance (sitting and/or standing);Decreased knowledge of use of DME or AE      OT Treatment/Interventions: Self-care/ADL training;Therapeutic exercise;Therapeutic activities;Patient/family education;DME and/or AE instruction    OT Goals(Current goals can be found in the care plan section) Acute Rehab OT Goals Patient Stated Goal: Get better OT Goal Formulation: With patient Time For Goal Achievement: 11/22/22 Potential to Achieve Goals: Good ADL Goals Pt Will Perform Grooming: with modified independence;sitting Pt Will Perform Lower Body Dressing: with modified independence;sitting/lateral leans;bed level;sit to/from stand Pt Will Transfer to Toilet: with modified independence;bedside commode (drop arm BSC) Pt Will Perform Toileting - Clothing Manipulation  and hygiene: with modified independence;sit to/from stand;sitting/lateral leans Pt Will Perform Tub/Shower Transfer: Tub transfer;tub bench  OT Frequency: Min 3X/week    Co-evaluation              AM-PAC OT "6 Clicks" Daily Activity     Outcome Measure Help from another person eating meals?: None Help from another person taking care of personal grooming?: A Little Help from another person toileting, which includes using toliet, bedpan, or urinal?: A Lot Help from  another person bathing (including washing, rinsing, drying)?: A Little Help from another person to put on and taking off regular upper body clothing?: None Help from another person to put on and taking off regular lower body clothing?: A Lot 6 Click Score: 18   End of Session Nurse Communication: Mobility status  Activity Tolerance: Patient tolerated treatment well Patient left: in chair;with chair alarm set;with call bell/phone within reach  OT Visit Diagnosis: Repeated falls (R29.6);Other abnormalities of gait and mobility (R26.89)                Time: 1340-1405 OT Time Calculation (min): 25 min Charges:  OT General Charges $OT Visit: 1 Visit OT Evaluation $OT Eval Moderate Complexity: 1 Mod OT Treatments $Self Care/Home Management : 8-22 mins  Linward Foster, MS, OTR/L  Alvester Morin 11/08/2022, 2:30 PM

## 2022-11-08 NOTE — Evaluation (Signed)
Physical Therapy Evaluation Patient Details Name: Timothy [name]. 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  History includes HTN, HLD, DM, COPD on 2-3L of O2, CHF, depression, PTSD, and bipolar. Currently on RA at this time at 95%.  Clinical Impression  Pt is a pleasant 62 year old male who was admitted for R BKA. Pt is POD 1 at time of evaluation. Pt performs bed mobility with independence and defers further mobility efforts at this time. Pt demonstrates deficits with pain/mobility/strength. Pt is currently not at baseline level. Would benefit from skilled PT to address above deficits and promote optimal return to PLOF. Pt will continue to receive skilled PT services while admitted and will defer to TOC/care team for updates regarding disposition planning.      Recommendations for follow up therapy are one component of a multi-disciplinary discharge planning process, led by the attending physician.  Recommendations may be updated based on patient status, additional functional criteria and insurance authorization.  Follow Up Recommendations Can patient physically be transported by private vehicle: No     Assistance Recommended at Discharge Intermittent Supervision/Assistance  Patient can return home with the following  A lot of help with walking and/or transfers;A lot of help with bathing/dressing/bathroom;Help with stairs or ramp for entrance;Assist for transportation    Equipment Recommendations Wheelchair (measurements PT);Wheelchair cushion (measurements PT);BSC/3in1 (drop arm)  Recommendations for Other Services       Functional Status Assessment Patient has had a recent decline in their functional status and demonstrates the ability to make significant improvements in function in a reasonable and predictable amount of time.     Precautions / Restrictions  Precautions Precautions: Fall Restrictions Weight Bearing Restrictions: Yes RLE Weight Bearing: Non weight bearing      Mobility  Bed Mobility Overal bed mobility: Independent             General bed mobility comments: takes slightly increased time to perform however able to transfer to EOB    Transfers                   General transfer comment: pt deferred OOB mobility this date despite encouragement. Preferred to sit at EOB to eat breakfast.    Ambulation/Gait                  Stairs            Wheelchair Mobility    Modified Rankin (Stroke Patients Only)       Balance Overall balance assessment: Needs assistance, History of Falls Sitting-balance support: Feet supported Sitting balance-Leahy Scale: Good                                       Pertinent Vitals/Pain Pain Assessment Pain Assessment: 0-10 Pain Score: 6  Pain Location: R LE Pain Descriptors / Indicators: Operative site guarding Pain Intervention(s): Limited activity within patient's tolerance, Premedicated before session, Repositioned    Home Living Family/patient expects to be discharged to:: Private residence Living Arrangements: Non-relatives/Friends (roomates) Available Help at Discharge: Friend(s) Type of Home: House Home Access: Stairs to enter Entrance Stairs-Rails: None Entrance Stairs-Number of Steps: 2 Alternate Level Stairs-Number of Steps: 11 Home Layout: Two level;Bed/bath upstairs Home Equipment: Cane - single point;Rolling Walker (2 wheels) Additional Comments: reports he  lives on the 2nd story and unsure if he would be able to stay down stairs as his roommate has the downstairs bedroom.    Prior Function Prior Level of Function : History of Falls (last six months);Independent/Modified Independent             Mobility Comments: reports he was indep with SPC, however report 5-7 falls recently. ADLs Comments: is expected to cook  dinner daily for the roommates as part of the agreement     Hand Dominance        Extremity/Trunk Assessment   Upper Extremity Assessment Upper Extremity Assessment: Generalized weakness    Lower Extremity Assessment Lower Extremity Assessment: Generalized weakness (R LE grossly 3/5; L LE grossly 4/5)       Communication   Communication: No difficulties  Cognition Arousal/Alertness: Awake/alert Behavior During Therapy: WFL for tasks assessed/performed Overall Cognitive Status: Within Functional Limits for tasks assessed                                 General Comments: agreeable to session        General Comments      Exercises Other Exercises Other Exercises: R knee ROM 30-75 degrees. Education given for progression with knee ROM. Other Exercises: seated ther-ex performed including B LAQ and alt marching x 10 reps with cga   Assessment/Plan    PT Assessment Patient needs continued PT services  PT Problem List Decreased strength;Decreased balance;Decreased mobility;Pain;Decreased knowledge of use of DME       PT Treatment Interventions DME instruction;Gait training;Therapeutic activities;Therapeutic exercise;Balance training;Wheelchair mobility training    PT Goals (Current goals can be found in the Care Plan section)  Acute Rehab PT Goals Patient Stated Goal: to go home PT Goal Formulation: With patient Time For Goal Achievement: 11/22/22 Potential to Achieve Goals: Good    Frequency Min 4X/week     Co-evaluation               AM-PAC PT "6 Clicks" Mobility  Outcome Measure Help needed turning from your back to your side while in a flat bed without using bedrails?: None Help needed moving from lying on your back to sitting on the side of a flat bed without using bedrails?: None Help needed moving to and from a bed to a chair (including a wheelchair)?: A Lot Help needed standing up from a chair using your arms (e.g., wheelchair or  bedside chair)?: A Lot Help needed to walk in hospital room?: A Lot Help needed climbing 3-5 steps with a railing? : Total 6 Click Score: 15    End of Session   Activity Tolerance: Patient tolerated treatment well Patient left: in bed;with bed alarm set (seated at EOB, RN notified) Nurse Communication: Mobility status PT Visit Diagnosis: Repeated falls (R29.6);Muscle weakness (generalized) (M62.81);Difficulty in walking, not elsewhere classified (R26.2);Pain Pain - Right/Left: Right Pain - part of body: Leg    Time: 1610-9604 PT Time Calculation (min) (ACUTE ONLY): 17 min   Charges:   PT Evaluation $PT Eval Low Complexity: 1 Low PT Treatments $Therapeutic Exercise: 8-22 mins        Elizabeth Palau, PT, DPT, GCS [phone]   Thales Knipple 11/08/2022, 11:13 AM

## 2022-11-08 NOTE — Progress Notes (Addendum)
Progress Note   Patient: Timothy [name]. ZOX:[id number] DOB: 13-Oct-1960 DOA: 11/02/2022     6 DOS: the patient was seen and examined on 11/08/2022    Subjective:    Patient seen and examined at bedside this morning He did complain of pain at the surgical site but improving Denies nausea vomiting chest pain abdominal pain or cough Currently working with physical therapist who has recommended skilled nursing facility placement    Brief Narrative / Hospital Course:  Timothy [name]. is a 62 y.o. male with medical history significant of hypertension, hyperlipidemia, diabetes mellitus, COPD on 2-3 L oxygen (2 L in the daytime, and 3L in night), diastolic CHF, depression with anxiety, PTSD, bipolar, DVT on Eliquis, who presents from podiatry office with bilateral foot ulcer and pain, unresponsive to outpatient abx.  Patient has a large and deep ulcer in right heel  and a small ulcer in the left plantar area.  05/17: MRI (+) osteomyelitis R calcaneus. Podiatry recs for BKA. Vasc surgery planning for RLE angio on Monday.  05/18-05/19 (weekend): stable 05/20: RLE angiography w/ angioplasty today w/ Dr Wyn Quaker 05/21: awaiting amputation tomorrow    Consultants:  Podiatry Vascular Surgery    Procedures: 11/05/2022 RLE angiography w/ angioplasty           ASSESSMENT & PLAN:   Principal Problem:   Osteomyelitis of right foot (HCC) Active Problems:   Sepsis (HCC)   Type 2 diabetes mellitus with diabetic neuropathy, unspecified (HCC)   Essential hypertension   Chronic diastolic CHF (congestive heart failure) (HCC)   COPD (chronic obstructive pulmonary disease) (HCC)   HLD (hyperlipidemia)   DVT (deep venous thrombosis) (HCC)   Current smoker   Depression with anxiety   PAD (peripheral artery disease) (HCC)   Sepsis due to osteomyelitis of right calcaneus and R diabetic ulcer present on admission  L foot stage 3 diabetic/pressure ulcer present on admission Patient meets  criteria for sepsis with WBC 14.8 and, heart rate 99, lactic acid normal 1.3. Currently on vancomycin, Flagyl, Rocephin most likely patient will not need any antibiotic at discharge since the affected limb have been amputated PRN Zofran for nausea tylenol, Percocet for pain Blood cultures x 2 wound care consult BKA vascular surgeon done on 11/07/2022 PT OT on board   Type 2 diabetes mellitus with diabetic neuropathy, unspecified [facility]): Patient has blood sugar 437, anion gap 17, hydroxybutyric acid 2.55, but bicarbonate is 22, pH 7.43 on VBG, patient may be on early stage of developing DKA on admission SSI Glargine insulin 44 units daily   Hypokalemia Replace as needed Monitor BMP   Essential hypertension IV hydralazine as needed held amlodipine and metoprolol since patient at risk of developing hypotension but BP has improved, restarted metoprolol  Continue home clonidine   Chronic diastolic CHF (congestive heart failure) (HCC):  2D echo on 06/07/2021 showed EF of 55 to 60% with grade 1 diastolic dysfunction.  Patient does not have leg edema JVD.  CHF is compensated.  BNP normal 72.6 Watch volume status closely   COPD (chronic obstructive pulmonary disease) (HCC): Stable Bronchodilators   HLD (hyperlipidemia) Crestor   History of DVT (deep venous thrombosis) (HCC) in March 2022 Used to be on elquis but according to patient he is no longer on eliquis. Records show repeat ultrasound in Dec 2022 showed resolution of DVT   Current smoker Nicotine patch   Depression with anxiety Continue home medications   PAD (peripheral artery disease) (HCC) s/p  RLE  angiogram 11/05/2022 Statin, Plavix      DVT prophylaxis: heparin      Current Admission Status: inpatient     TOC needs / Dispo plan: pend surgical intervention likely will need SNF   Barriers to discharge / significant pending items: After clearance by vascular surgeon      Physical Exam: Constitutional:       General: He is not in acute distress.    Appearance: Normal appearance.  Cardiovascular:     Rate and Rhythm: Normal rate and regular rhythm.  Pulmonary:     Effort: Pulmonary effort is normal.     Breath sounds: Normal breath sounds.  Abdominal:     General: Abdomen is flat.     Palpations: Abdomen is soft.  Musculoskeletal: Right below-knee amputation stump noted   Neurological:     General: No focal deficit present.     Mental Status: He is alert. Mental status is at baseline.  Psychiatric:        Mood and Affect: Mood normal.        Behavior: Behavior normal.      Data Reviewed: I have reviewed patient's laboratory results showing glucose 202, sodium 134 potassium 4.3 creatinine 0.7 WBC 11.69 hemoglobin 9.7   Time spent: 42 minutes  Vitals:   11/07/22 1739 11/07/22 2019 11/08/22 0427 11/08/22 0738  BP: 136/85 128/83 114/80 124/75  Pulse: 78 81 70 76  Resp: 16 18 18 16   Temp: 98.2 F (36.8 C) 98.5 F (36.9 C) 98.7 F (37.1 C) 98.2 F (36.8 C)  TempSrc: Oral Oral Oral Oral  SpO2: 96% 97% 93% 94%  Weight:      Height:         Author: Loyce Dys, MD 11/08/2022 2:14 PM  For on call review [email or web address].

## 2022-11-09 ENCOUNTER — Inpatient Hospital Stay: Payer: Medicaid Other

## 2022-11-09 DIAGNOSIS — T8743 Infection of amputation stump, right lower extremity: Secondary | ICD-10-CM

## 2022-11-09 DIAGNOSIS — M86171 Other acute osteomyelitis, right ankle and foot: Secondary | ICD-10-CM

## 2022-11-09 DIAGNOSIS — E162 Hypoglycemia, unspecified: Secondary | ICD-10-CM

## 2022-11-09 DIAGNOSIS — L89623 Pressure ulcer of left heel, stage 3: Secondary | ICD-10-CM

## 2022-11-09 DIAGNOSIS — G9340 Encephalopathy, unspecified: Secondary | ICD-10-CM | POA: Diagnosis not present

## 2022-11-09 DIAGNOSIS — L03115 Cellulitis of right lower limb: Secondary | ICD-10-CM

## 2022-11-09 DIAGNOSIS — G9341 Metabolic encephalopathy: Secondary | ICD-10-CM

## 2022-11-09 DIAGNOSIS — E10621 Type 1 diabetes mellitus with foot ulcer: Secondary | ICD-10-CM

## 2022-11-09 LAB — COMPREHENSIVE METABOLIC PANEL
ALT: 18 U/L (ref 0–44)
AST: 30 U/L (ref 15–41)
Albumin: 2.7 g/dL — ABNORMAL LOW (ref 3.5–5.0)
Alkaline Phosphatase: 302 U/L — ABNORMAL HIGH (ref 38–126)
Anion gap: 10 (ref 5–15)
BUN: 10 mg/dL (ref 6–20)
CO2: 30 mmol/L (ref 22–32)
Calcium: 8.6 mg/dL — ABNORMAL LOW (ref 8.9–10.3)
Chloride: 98 mmol/L (ref 98–111)
Creatinine, Ser: 0.89 mg/dL (ref 0.61–1.24)
GFR, Estimated: >60 mL/min (ref >60)
Glucose, Bld: 30 mg/dL — CL (ref 70–99)
Potassium: 3.6 mmol/L (ref 3.5–5.1)
Sodium: 138 mmol/L (ref 135–145)
Total Bilirubin: 0.5 mg/dL (ref 0.3–1.2)
Total Protein: 8.2 g/dL — ABNORMAL HIGH (ref 6.5–8.1)

## 2022-11-09 LAB — CREATININE, SERUM
Creatinine, Ser: 0.82 mg/dL (ref 0.61–1.24)
GFR, Estimated: >60 mL/min (ref >60)

## 2022-11-09 LAB — GLUCOSE, CAPILLARY
Glucose-Capillary: 102 mg/dL — ABNORMAL HIGH (ref 70–99)
Glucose-Capillary: 137 mg/dL — ABNORMAL HIGH (ref 70–99)
Glucose-Capillary: 75 mg/dL (ref 70–99)
Glucose-Capillary: 155 mg/dL — ABNORMAL HIGH (ref 70–99)
Glucose-Capillary: 24 mg/dL — CL (ref 70–99)
Glucose-Capillary: 203 mg/dL — ABNORMAL HIGH (ref 70–99)
Glucose-Capillary: 251 mg/dL — ABNORMAL HIGH (ref 70–99)

## 2022-11-09 LAB — CBC
HCT: 34.4 % — ABNORMAL LOW (ref 39.0–52.0)
Hemoglobin: 10.5 g/dL — ABNORMAL LOW (ref 13.0–17.0)
MCH: 27.5 pg (ref 26.0–34.0)
MCHC: 30.5 g/dL (ref 30.0–36.0)
MCV: 90.1 fL (ref 80.0–100.0)
Platelets: 729 10*3/uL — ABNORMAL HIGH (ref 150–400)
RBC: 3.82 MIL/uL — ABNORMAL LOW (ref 4.22–5.81)
RDW: 13.2 % (ref 11.5–15.5)
WBC: 11.2 10*3/uL — ABNORMAL HIGH (ref 4.0–10.5)
nRBC: 0 % (ref 0.0–0.2)

## 2022-11-09 LAB — MRSA NEXT GEN BY PCR, NASAL: MRSA by PCR Next Gen: NOT DETECTED

## 2022-11-09 LAB — TROPONIN I (HIGH SENSITIVITY)
Troponin I (High Sensitivity): 8 ng/L (ref <18)
Troponin I (High Sensitivity): 8 ng/L (ref <18)

## 2022-11-09 MED ORDER — SODIUM CHLORIDE 0.9 % IV SOLN: INTRAVENOUS | Status: DC

## 2022-11-09 MED ORDER — DEXTROSE 50 % IV SOLN
INTRAVENOUS | Status: AC
  Administered 2022-11-09: 50 mL via INTRAVENOUS
  Filled 2022-11-09: qty 50

## 2022-11-09 MED ORDER — LORAZEPAM 2 MG/ML IJ SOLN: 1.0000 mg | Freq: Once | INTRAMUSCULAR | Status: DC

## 2022-11-09 MED ORDER — HALOPERIDOL LACTATE 5 MG/ML IJ SOLN: 2.0000 mg | Freq: Once | INTRAMUSCULAR | Status: DC

## 2022-11-09 MED ORDER — LEVETIRACETAM IN NACL 1000 MG/100ML IV SOLN
1000.0000 mg | Freq: Two times a day (BID) | INTRAVENOUS | Status: DC
  Administered 2022-11-09 – 2022-11-10 (×2): 1000 mg via INTRAVENOUS
  Filled 2022-11-09 (×3): qty 100

## 2022-11-09 MED ORDER — CHLORHEXIDINE GLUCONATE CLOTH 2 % EX PADS
6.0000 | MEDICATED_PAD | Freq: Every day | CUTANEOUS | Status: DC
  Administered 2022-11-09 – 2022-11-18 (×10): 6 via TOPICAL

## 2022-11-09 MED ORDER — SENNOSIDES-DOCUSATE SODIUM 8.6-50 MG PO TABS
1.0000 | ORAL_TABLET | Freq: Two times a day (BID) | ORAL | Status: DC
  Administered 2022-11-09 – 2022-11-13 (×10): 1 via ORAL
  Filled 2022-11-09 (×10): qty 1

## 2022-11-09 MED ORDER — INSULIN GLARGINE-YFGN 100 UNIT/ML ~~LOC~~ SOLN
25.0000 [IU] | Freq: Every day | SUBCUTANEOUS | Status: DC
  Administered 2022-11-10 – 2022-11-24 (×15): 25 [IU] via SUBCUTANEOUS
  Filled 2022-11-09 (×15): qty 0.25

## 2022-11-09 MED ORDER — HALOPERIDOL LACTATE 5 MG/ML IJ SOLN
INTRAMUSCULAR | Status: AC
  Filled 2022-11-09: qty 1

## 2022-11-09 MED ORDER — DEXTROSE 50 % IV SOLN: 1.0000 | Freq: Once | INTRAVENOUS | Status: AC

## 2022-11-09 NOTE — Progress Notes (Signed)
[facility]  Daily Progress Note   Subjective  -   Doing well.  Still in pain but improving. No major events overnight  Objective Vitals:   11/08/22 1557 11/08/22 2015 11/09/22 0433 11/09/22 0808  BP: 108/78 111/73 119/74 128/83  Pulse: 73 72 74 73  Resp: 18 18 18 18   Temp: 98.4 F (36.9 C) 98.6 F (37 C) 98 F (36.7 C) 98.1 F (36.7 C)  TempSrc:  Oral Oral Oral  SpO2: 96% 94% 99% 100%  Weight:      Height:        Intake/Output Summary (Last 24 hours) at 11/09/2022 0831 Last data filed at 11/09/2022 0500 Gross per 24 hour  Intake 720 ml  Output 1600 ml  Net -880 ml    PULM  CTAB CV  RRR VASC  Dressing C/D/I  Laboratory CBC    Component Value Date/Time   WBC 11.6 (H) 11/08/2022 0412   HGB 9.7 (L) 11/08/2022 0412   HGB 14.6 06/22/2020 1058   HCT 31.3 (L) 11/08/2022 0412   HCT 41.8 06/22/2020 1058   PLT 517 (H) 11/08/2022 0412   PLT 301 06/22/2020 1058    BMET    Component Value Date/Time   NA 134 (L) 11/08/2022 0412   NA 134 06/22/2020 1058   K 4.1 11/08/2022 0412   CL 97 (L) 11/08/2022 0412   CO2 27 11/08/2022 0412   GLUCOSE 120 (H) 11/08/2022 0412   BUN 10 11/08/2022 0412   BUN 8 06/22/2020 1058   CREATININE 0.82 11/09/2022 0426   CALCIUM 8.2 (L) 11/08/2022 0412   GFRNONAA >60 11/09/2022 0426   GFRAA 107 06/22/2020 1058    Assessment/Planning: POD #2 s/p right BKA for osteomyelitis   Doing well Continue PT Remove dressing tomorrow or Sunday Likely need placement   [facility]  11/09/2022, 8:31 AM

## 2022-11-09 NOTE — Progress Notes (Signed)
Rapid Response Event Note   Reason for Call : Altered mental status   Initial Focused Assessment: Upon arrival patient noted to have jerking movements in bilateral upper extremities but tracking with eyes, diaphoretic, Hypertensive, and unequal pupils.       Interventions: Patient placed on cardiac monitor, CBG obtained, Primary nurse Misty had already notified Dr. Meriam Sprague.  Order obtained for STAT head CT.   Plan of Care: Code stroke initiated at 16:31. Patient taken to CT and then admitted to ICU 14    Event Summary: Patient stabilized in ICU 14.   MD Notified: 16:20 Call Time:16:14 Arrival Time:16:18 End Time:16:40  Kelby Fam, RN

## 2022-11-09 NOTE — Progress Notes (Signed)
Occupational Therapy Treatment Patient Details Name: Timothy [name]. 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  History includes HTN, HLD, DM, COPD on 2-3L of O2, CHF, depression, PTSD, and bipolar. Currently on RA at this time at 95%.   OT comments  Pt received semi-reclined in bed, blanket on body and pt not wearing gown; OT brought gown for pt and he donned it. Appearing alert, slightly more animated today compared to yesterday; willing to work with OT on t/f to recliner and grooming from seated. T/f MOD (I) after OT set up the chair, pt lateral scooting to the R. See flowsheet below for further details of session. Left in chair, alarm on, call bell in reach, with all needs in reach.     Recommendations for follow up therapy are one component of a multi-disciplinary discharge planning process, led by the attending physician.  Recommendations may be updated based on patient status, additional functional criteria and insurance authorization.    Assistance Recommended at Discharge Intermittent Supervision/Assistance  Patient can return home with the following  A little help with walking and/or transfers;A little help with bathing/dressing/bathroom;Assistance with cooking/housework;Direct supervision/assist for medications management;Assist for transportation;Help with stairs or ramp for entrance   Equipment Recommendations  Other (comment) (defer to next venue of care)    Recommendations for Other Services      Precautions / Restrictions Precautions Precautions: Fall Restrictions Weight Bearing Restrictions: Yes RLE Weight Bearing: Non weight bearing       Mobility Bed Mobility Overal bed mobility: Independent             General bed mobility comments: increased time; no physical assist    Transfers Overall transfer level: Modified independent Equipment  used: None (lateral scoot) Transfers: Bed to chair/wheelchair/BSC            Lateral/Scoot Transfers: Modified independent (Device/Increase time) (after OT set up the recliner chair) General transfer comment: scoot to the R into recliner without physical assist of OT; short rest breaks     Balance Overall balance assessment: History of Falls, Mild deficits observed, not formally tested                                         ADL either performed or assessed with clinical judgement   ADL Overall ADL's : Needs assistance/impaired     Grooming: Set up;Sitting;Oral care                     Toilet Transfer Details (indicate cue type and reason): not tested; may need drop-arm BSC           General ADL Comments: UE functional throughout session    Extremity/Trunk Assessment Upper Extremity Assessment Upper Extremity Assessment: Overall WFL for tasks assessed   Lower Extremity Assessment Lower Extremity Assessment: Defer to PT evaluation        Vision       Perception     Praxis      Cognition Arousal/Alertness: Awake/alert Behavior During Therapy: WFL for tasks assessed/performed Overall Cognitive Status: Within Functional Limits for tasks assessed  General Comments: Affect less flat today compared to yesterday        Exercises      Shoulder Instructions       General Comments Pt on room air today.    Pertinent Vitals/ Pain       Pain Assessment Pain Assessment: 0-10 Pain Score:  (none at rest, intermittently high (shooting pain)) Pain Location: RLE Pain Descriptors / Indicators: Operative site guarding, Shooting, Grimacing Pain Intervention(s): Limited activity within patient's tolerance, Monitored during session  Home Living                                          Prior Functioning/Environment              Frequency  Min 3X/week        Progress  Toward Goals  OT Goals(current goals can now be found in the care plan section)  Progress towards OT goals: Progressing toward goals  Acute Rehab OT Goals Patient Stated Goal: Get better OT Goal Formulation: With patient Time For Goal Achievement: 11/22/22 Potential to Achieve Goals: Good ADL Goals Pt Will Perform Grooming: with modified independence;sitting Pt Will Perform Lower Body Dressing: with modified independence;sitting/lateral leans;bed level;sit to/from stand Pt Will Transfer to Toilet: with modified independence;bedside commode Pt Will Perform Toileting - Clothing Manipulation and hygiene: with modified independence;sit to/from stand;sitting/lateral leans Pt Will Perform Tub/Shower Transfer: Tub transfer;tub bench  Plan Discharge plan remains appropriate    Co-evaluation                 AM-PAC OT "6 Clicks" Daily Activity     Outcome Measure   Help from another person eating meals?: None Help from another person taking care of personal grooming?: A Little Help from another person toileting, which includes using toliet, bedpan, or urinal?: A Lot Help from another person bathing (including washing, rinsing, drying)?: A Little Help from another person to put on and taking off regular upper body clothing?: None Help from another person to put on and taking off regular lower body clothing?: A Lot 6 Click Score: 18    End of Session Equipment Utilized During Treatment:  (recliner chair with drop arm feature)  OT Visit Diagnosis: Repeated falls (R29.6);Other abnormalities of gait and mobility (R26.89)   Activity Tolerance Patient tolerated treatment well   Patient Left in chair;with chair alarm set;with call bell/phone within reach   Nurse Communication Mobility status        Time: 4098-1191 OT Time Calculation (min): 23 min  Charges: OT General Charges $OT Visit: 1 Visit OT Treatments $Self Care/Home Management : 23-37 mins  Linward Foster, MS,  OTR/L   Alvester Morin 11/09/2022, 1:42 PM

## 2022-11-09 NOTE — Progress Notes (Addendum)
Progress Note   Patient: Timothy [name]. ZOX:[id number] DOB: Oct 05, 1960 DOA: 11/02/2022     7 DOS: the patient was seen and examined on 11/09/2022    Subjective:    Patient seen and examined at bedside this morning Awaiting surgical clearance after dressing change tomorrow Denies nausea vomiting chest pain abdominal pain or cough Patient will need skilled nursing facility/rehab placement    Brief Narrative / Hospital Course:  Timothy [name]. is a 62 y.o. male with medical history significant of hypertension, hyperlipidemia, diabetes mellitus, COPD on 2-3 L oxygen (2 L in the daytime, and 3L in night), diastolic CHF, depression with anxiety, PTSD, bipolar, DVT on Eliquis, who presents from podiatry office with bilateral foot ulcer and pain, unresponsive to outpatient abx.  Patient has a large and deep ulcer in right heel  and a small ulcer in the left plantar area.  05/17: MRI (+) osteomyelitis R calcaneus. Podiatry recs for BKA. Vasc surgery planning for RLE angio on Monday.  05/18-05/19 (weekend): stable 05/20: RLE angiography w/ angioplasty today w/ Dr Wyn Quaker 05/21: awaiting amputation tomorrow    Consultants:  Podiatry Vascular Surgery    Procedures: 11/05/2022 RLE angiography w/ angioplasty 11/07/2022-right BKA done           ASSESSMENT & PLAN:   Principal Problem:   Osteomyelitis of right foot (HCC) Active Problems:   Sepsis (HCC)   Type 2 diabetes mellitus with diabetic neuropathy, unspecified (HCC)   Essential hypertension   Chronic diastolic CHF (congestive heart failure) (HCC)   COPD (chronic obstructive pulmonary disease) (HCC)   HLD (hyperlipidemia)   DVT (deep venous thrombosis) (HCC)   Current smoker   Depression with anxiety   PAD (peripheral artery disease) (HCC)   Sepsis due to osteomyelitis of right calcaneus and R diabetic ulcer present on admission  L foot stage 3 diabetic/pressure ulcer present on admission Patient meets criteria for sepsis  with WBC 14.8 and, heart rate 99, lactic acid normal 1.3. Currently on vancomycin, Flagyl, Rocephin most likely patient will not need any antibiotic at discharge since the affected limb have been amputated PRN Zofran for nausea tylenol, Percocet for pain Blood cultures x 2 wound care consult BKA vascular surgeon done on 11/07/2022 PT OT on board   Type 2 diabetes mellitus with diabetic neuropathy, unspecified [facility]): Patient has blood sugar 437, anion gap 17, hydroxybutyric acid 2.55, but bicarbonate is 22, pH 7.43 on VBG, patient may be on early stage of developing DKA on admission SSI Glargine insulin 44 units daily   Hypokalemia Replace as needed Monitor BMP   Essential hypertension IV hydralazine as needed held amlodipine and metoprolol since patient at risk of developing hypotension but BP has improved, restarted metoprolol  Continue home clonidine   Chronic diastolic CHF (congestive heart failure) (HCC):  2D echo on 06/07/2021 showed EF of 55 to 60% with grade 1 diastolic dysfunction.  Patient does not have leg edema JVD.  CHF is compensated.  BNP normal 72.6 Watch volume status closely   COPD (chronic obstructive pulmonary disease) (HCC): Stable Bronchodilators   HLD (hyperlipidemia) Crestor   History of DVT (deep venous thrombosis) (HCC) in March 2022 Used to be on elquis but according to patient he is no longer on eliwuis. Records show repeat ultrasound in Dec 2022 showed resolution of DVT   Current smoker Nicotine patch   Depression with anxiety Continue home medications   PAD (peripheral artery disease) (HCC) s/p  RLE angiogram 11/05/2022 Statin, Plavix  DVT prophylaxis: Heparin     Current Admission Status: inpatient     TOC needs / Dispo plan: Skilled nursing facility/rehab   Barriers to discharge / significant pending items: After clearance by vascular surgeon hopefully by tomorrow       Physical Exam: Constitutional:      General: He is not  in acute distress.    Appearance: Normal appearance.  Cardiovascular:     Rate and Rhythm: Normal rate and regular rhythm.  Pulmonary:     Effort: Pulmonary effort is normal.     Breath sounds: Normal breath sounds.  Abdominal:     General: Abdomen is flat.     Palpations: Abdomen is soft.  Musculoskeletal: Right below-knee amputation stump noted   Neurological:     General: No focal deficit present.     Mental Status: He is alert. Mental status is at baseline.  Psychiatric:        Mood and Affect: Mood normal.        Behavior: Behavior normal.      Data Reviewed: No lab results today for review   Time spent: 40 minutes    Vitals:   11/08/22 1557 11/08/22 2015 11/09/22 0433 11/09/22 0808  BP: 108/78 111/73 119/74 128/83  Pulse: 73 72 74 73  Resp: 18 18 18 18   Temp: 98.4 F (36.9 C) 98.6 F (37 C) 98 F (36.7 C) 98.1 F (36.7 C)  TempSrc:  Oral Oral Oral  SpO2: 96% 94% 99% 100%  Weight:      Height:         Author: Loyce Dys, MD 11/09/2022 2:46 PM  For on call review [email or web address].

## 2022-11-09 NOTE — Progress Notes (Signed)
Upon entering room, RN observed patient to have jerky movements in bilateral upper extremities in bed. Patient was also to be very diaphoretic, unequal pupil size and delayed responses. RN contacted Consulting civil engineer for assistance. Rapid response was initiated. LKW: [id number].   Madie Reno, RN

## 2022-11-09 NOTE — Progress Notes (Signed)
Palliative consult received.  On arrival to ICU patient with altered mental status with associated combativeness. Found to be hypoglycemic-treated, improvement in mentation but remains pleasantly confused. Able to share he does not have contact with any of his family. No emergency contact listed in EMR. He shares he has a roommate but he is not cordial with him. He mentions a 30+ year friend, Timothy [name]. Timothy [name] provided Timothy [name] number to be listed in chart.  Timothy [name] called Timothy [name] and she herself is in room 227-she requested to talk to someone in person as she was having difficulty understanding Timothy [name]. Timothy [name] provided consent for me to visit with Timothy [name].  Timothy [name] confirms Timothy [name] does not have contact with any of his family. She confirms she is his only consistent friend that he has ongoing contact with. She shares she would be willing to step up if needed for medical decision making once she is feeling better and has been discharged from hospital.  Will follow up with Timothy [name] 5/25 and hope for ongoing improvement in mentation.  CCM made aware.  No Charge.  Leeanne Deed, DNP, AGNP-C Palliative Medicine  Please call Palliative Medicine team phone with any questions [phone]. For individual providers please see AMION.

## 2022-11-09 NOTE — Progress Notes (Signed)
Physical Therapy Treatment Patient Details Name: Timothy [name]. 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  PMH includes HTN, HLD, DM, COPD on 2-3L of O2, CHF, depression, PTSD, and bipolar.    PT Comments    Pt was pleasant and motivated to participate during the session and put forth good effort throughout. Pt required multi-modal cuing for proper sequencing during sit to/from stand transfer training as well as min A to come to full upright position from an elevated EOB.  Once in standing pt was fairly steady with no overt LOB but was highly reliant on the RW for stability.  Pt able to perform multiple sit to/from stand transfers with assist but was unable to advance his L foot either by shuffling or utilizing hop-to method.  Pt will benefit from continued PT services upon discharge to safely address deficits listed in patient problem list for decreased caregiver assistance and eventual return to PLOF.     Recommendations for follow up therapy are one component of a multi-disciplinary discharge planning process, led by the attending physician.  Recommendations may be updated based on patient status, additional functional criteria and insurance authorization.  Follow Up Recommendations  Can patient physically be transported by private vehicle: No    Assistance Recommended at Discharge Intermittent Supervision/Assistance  Patient can return home with the following A lot of help with walking and/or transfers;A lot of help with bathing/dressing/bathroom;Help with stairs or ramp for entrance;Assist for transportation;Assistance with Charity fundraiser (measurements PT);Wheelchair cushion (measurements PT);BSC/3in1 (drop arm BSC)    Recommendations for Other Services       Precautions / Restrictions Precautions Precautions:  Fall Restrictions Weight Bearing Restrictions: Yes RLE Weight Bearing: Non weight bearing     Mobility  Bed Mobility Overal bed mobility: Modified Independent             General bed mobility comments: Increased time and effort and use of the bed rail    Transfers Overall transfer level: Needs assistance Equipment used: Rolling walker (2 wheels) Transfers: Sit to/from Stand Sit to Stand: Min assist, From elevated surface           General transfer comment: Mod verbal and tactile cues for hand placement and increased trunk flexion    Ambulation/Gait               General Gait Details: Cues given for sequencing during attempt to shuffle or hop on the LLE with pt unable to advance the foot using either method   Stairs             Wheelchair Mobility    Modified Rankin (Stroke Patients Only)       Balance Overall balance assessment: History of Falls, Needs assistance Sitting-balance support: Feet supported, Bilateral upper extremity supported Sitting balance-Leahy Scale: Good     Standing balance support: Bilateral upper extremity supported Standing balance-Leahy Scale: Fair                              Cognition Arousal/Alertness: Awake/alert Behavior During Therapy: WFL for tasks assessed/performed Overall Cognitive Status: No family/caregiver present to determine baseline cognitive functioning  Exercises Total Joint Exercises Quad Sets: AROM, Strengthening, Right, 10 reps, 15 reps Hip ABduction/ADduction: AAROM, Strengthening, Right, 10 reps Straight Leg Raises: AAROM, Right, Strengthening, 10 reps Long Arc Quad: AROM, Strengthening, Both, 5 reps, 10 reps Knee Flexion: AROM, Strengthening, Both, 5 reps, 10 reps Other Exercises: LLE standing mini squats x 5, low amplitude Other Exercises: Pt education provided on RLE positioning to promote R knee ext PROM Other  Exercises: Pt education provided on RLE QS x 10 every 1-2 hours daily and seated LAQ/knee flex x 10 reps whenever staff has him sitting at the EOB for any reason    General Comments General comments (skin integrity, edema, etc.): Pt on room air today.      Pertinent Vitals/Pain Pain Assessment Pain Assessment: 0-10 Pain Score: 4  Pain Location: RLE Pain Descriptors / Indicators: Operative site guarding, Sore, Grimacing Pain Intervention(s): Repositioned, Premedicated before session, Monitored during session    Home Living                          Prior Function            PT Goals (current goals can now be found in the care plan section) Progress towards PT goals: Progressing toward goals    Frequency    Min 4X/week      PT Plan Current plan remains appropriate    Co-evaluation              AM-PAC PT "6 Clicks" Mobility   Outcome Measure  Help needed turning from your back to your side while in a flat bed without using bedrails?: A Little Help needed moving from lying on your back to sitting on the side of a flat bed without using bedrails?: A Little Help needed moving to and from a bed to a chair (including a wheelchair)?: A Little Help needed standing up from a chair using your arms (e.g., wheelchair or bedside chair)?: A Lot Help needed to walk in hospital room?: Total Help needed climbing 3-5 steps with a railing? : Total 6 Click Score: 13    End of Session Equipment Utilized During Treatment: Gait belt Activity Tolerance: Patient tolerated treatment well Patient left: in bed;with bed alarm set;with call bell/phone within reach;Other (comment) (Pt declined up to chair) Nurse Communication: Mobility status PT Visit Diagnosis: Repeated falls (R29.6);Muscle weakness (generalized) (M62.81);Difficulty in walking, not elsewhere classified (R26.2);Pain Pain - Right/Left: Right Pain - part of body: Leg     Time: 1340-1405 PT Time Calculation  (min) (ACUTE ONLY): 25 min  Charges:  $Therapeutic Exercise: 8-22 mins $Therapeutic Activity: 8-22 mins                    D. Scott Edel Rivero PT, DPT 11/09/22, 3:03 PM

## 2022-11-09 NOTE — Progress Notes (Signed)
Received patient from the medical surgical unit S/P Right BKA on 11/07/22. Around about 4 PM today rapid response was called on, Pt apparently became altered with jerking movement associated with dilatation of the left pupil that was sluggishly reactive to light. Patient was taken to stat CT head to r/o stroke. Pt was brought to CCU 14. At this time, pt was very agitated, combative, hitting, kicking and yelling. During handoff, RN reported Blood sugar was 75, blood sugar now is 24 mg/dl. Pt is being treated for Hypoglycemia per protocol. Recheck BS was 55 and later 155. After given him D 50 1 amp and suddenly pt became more oriented and started asking how "he got to ICU and what happened to him". Pt was given orange juice and Glucerna ensure protein drink. Pt is now a/ox4, following command, VSS, Patient stated having pain to his Right BKA and prn pain medication was given. Pt is resting now.

## 2022-11-09 NOTE — [place] FL2 (Signed)
[place] MEDICAID FL2 LEVEL OF CARE FORM     IDENTIFICATION  Patient Name: Timothy [name]. Birthdate: November 22, 1960 Sex: male Admission Date (Current Location): 11/02/2022  [facility] and [place] Number:  Chiropodist and Address:  [facility], [address], [place], [place] [address]      Provider Number: [id number]  Attending Physician Name and Address:  Loyce Dys, MD  Relative Name and Phone Number:       Current Level of Care: Hospital Recommended Level of Care: Skilled Nursing Facility Prior Approval Number:    Date Approved/Denied:   PASRR Number: pending  Discharge Plan:      Current Diagnoses: Patient Active Problem List   Diagnosis Date Noted   Pressure injury of skin 11/03/2022   History of DVT (deep vein thrombosis) 11/03/2022   Diabetic foot infection (HCC) 11/02/2022   Depression with anxiety 11/02/2022   HLD (hyperlipidemia) 11/02/2022   Sepsis (HCC) 11/02/2022   PAD (peripheral artery disease) (HCC) 11/02/2022   Osteomyelitis of right foot (HCC) 11/02/2022   Hyperlipidemia, mixed 10/02/2022   Fall 06/27/2022   Bipolar disorder with psychotic features (HCC) 05/28/2022   Generalized anxiety disorder 05/08/2022   Facial abscess 08/08/2021   Facial cellulitis 08/08/2021   Uncontrolled type 2 diabetes mellitus with hyperglycemia (HCC) 08/08/2021   COPD (chronic obstructive pulmonary disease) (HCC) 08/08/2021   Atherosclerosis of native arteries of the extremities with ulceration (HCC) 04/18/2021   DJD (degenerative joint disease), lumbosacral 04/18/2021   Leg pain 01/23/2021   Acute deep vein thrombosis (DVT) of left lower extremity (HCC) 01/23/2021   Atherosclerosis of native arteries of extremity with intermittent claudication (HCC) 01/22/2021   MDD (major depressive disorder), recurrent severe, without psychosis (HCC) 12/19/2020   Severe episode of recurrent major depressive disorder, with psychotic features  (HCC)    COPD with acute exacerbation (HCC) 12/14/2020   Hx of sleep apnea 12/14/2020   Empyema lung (HCC) 08/15/2020   Type 2 diabetes mellitus with hyperglycemia (HCC) 08/11/2020   Respiratory failure with hypoxia (HCC) 08/11/2020   Hyponatremia 08/11/2020   Chronic diastolic CHF (congestive heart failure) (HCC) 08/11/2020   Anemia 08/11/2020   Abdominal pain 08/11/2020   Empyema (HCC) 08/10/2020   Osteomyelitis (HCC) 03/15/2020   Open wound of foot 01/20/2020   Elevated lipids 01/20/2020   Personal history of colonic polyps    Shortness of breath 11/04/2019   Encounter to establish care 10/15/2019   Essential hypertension 10/15/2019   Controlled diabetes mellitus type 2 with complications (HCC) 10/15/2019   Current smoker 10/15/2019   Peripheral neuropathy 10/15/2019   Cramp, abdominal 10/15/2019   History of hepatitis 10/15/2019   History of gastroesophageal reflux (GERD) 10/15/2019   Type 2 diabetes mellitus with diabetic neuropathy, unspecified (HCC) 10/15/2019   Post-traumatic stress disorder, unspecified 07/14/2019   Antisocial personality disorder (HCC) 07/14/2019    Orientation RESPIRATION BLADDER Height & Weight     Self, Time, Situation, Place  Normal Continent Weight: 197 lb 1.5 oz (89.4 kg) Height:  5\' 10"  (177.8 cm)  BEHAVIORAL SYMPTOMS/MOOD NEUROLOGICAL BOWEL NUTRITION STATUS      Continent Diet (regular)  AMBULATORY STATUS COMMUNICATION OF NEEDS Skin   Limited Assist Verbally Surgical wounds (stage 3 left foot, surgical incision right leg)                       Personal Care Assistance Level of Assistance  Bathing, Feeding, Dressing Bathing Assistance: Limited assistance Feeding assistance:  Limited assistance Dressing Assistance: Limited assistance     Functional Limitations Info             SPECIAL CARE FACTORS FREQUENCY  PT (By licensed PT), OT (By licensed OT)     PT Frequency: 5 times per week OT Frequency: 5 times per week             Contractures      Additional Factors Info  Code Status, Allergies Code Status Info: full Allergies Info: Bee Venom, Cheese, Coffee Arabica, Coffee Flavor           Current Medications (11/09/2022):  This is the current hospital active medication list Current Facility-Administered Medications  Medication Dose Route Frequency Provider Last Rate Last Admin   acetaminophen (TYLENOL) tablet 650 mg  650 mg Oral Q6H PRN Annice Needy, MD       albuterol (PROVENTIL) (2.5 MG/3ML) 0.083% nebulizer solution 3 mL  3 mL Inhalation Q4H PRN Annice Needy, MD       ARIPiprazole (ABILIFY) tablet 10 mg  10 mg Oral Daily Annice Needy, MD   10 mg at 11/09/22 0944   aspirin EC tablet 81 mg  81 mg Oral Daily Annice Needy, MD   81 mg at 11/09/22 0944   buPROPion (WELLBUTRIN SR) 12 hr tablet 150 mg  150 mg Oral BID Annice Needy, MD   150 mg at 11/09/22 0944   cefTRIAXone (ROCEPHIN) 2 g in sodium chloride 0.9 % 100 mL IVPB  2 g Intravenous Q24H Rosezetta Schlatter T, MD 200 mL/hr at 11/08/22 1409 2 g at 11/08/22 1409   cloNIDine (CATAPRES) tablet 0.1 mg  0.1 mg Oral QHS Annice Needy, MD   0.1 mg at 11/08/22 2038   clopidogrel (PLAVIX) tablet 75 mg  75 mg Oral Daily Annice Needy, MD   75 mg at 11/09/22 0945   cyanocobalamin (VITAMIN B12) tablet 1,000 mcg  1,000 mcg Oral Daily Annice Needy, MD   1,000 mcg at 11/09/22 0945   dextromethorphan-guaiFENesin (MUCINEX DM) 30-600 MG per 12 hr tablet 1 tablet  1 tablet Oral BID PRN Annice Needy, MD       gabapentin (NEURONTIN) capsule 400 mg  400 mg Oral TID Annice Needy, MD   400 mg at 11/09/22 0945   heparin injection 5,000 Units  5,000 Units Subcutaneous Q8H Annice Needy, MD   5,000 Units at 11/09/22 0507   hydrALAZINE (APRESOLINE) injection 5 mg  5 mg Intravenous Q2H PRN Annice Needy, MD       HYDROmorphone (DILAUDID) injection 1 mg  1 mg Intravenous Q3H PRN Annice Needy, MD   1 mg at 11/08/22 2040   insulin aspart (novoLOG) injection 0-5 Units  0-5 Units Subcutaneous QHS  Annice Needy, MD   2 Units at 11/05/22 2223   insulin aspart (novoLOG) injection 0-9 Units  0-9 Units Subcutaneous TID WC Annice Needy, MD   1 Units at 11/09/22 1259   insulin aspart (novoLOG) injection 8 Units  8 Units Subcutaneous TID WC Annice Needy, MD   8 Units at 11/09/22 1301   insulin glargine-yfgn (SEMGLEE) injection 44 Units  44 Units Subcutaneous Daily Annice Needy, MD   44 Units at 11/09/22 0945   magnesium oxide (MAG-OX) tablet 800 mg  800 mg Oral BID Annice Needy, MD   800 mg at 11/09/22 0944   metoprolol succinate (TOPROL-XL) 24 hr tablet 50 mg  50  mg Oral Daily Annice Needy, MD   50 mg at 11/09/22 0944   metroNIDAZOLE (FLAGYL) IVPB 500 mg  500 mg Intravenous Q12H Loyce Dys, MD   Stopped at 11/09/22 0121   mometasone-formoterol (DULERA) 100-5 MCG/ACT inhaler 2 puff  2 puff Inhalation BID Annice Needy, MD   2 puff at 11/09/22 0943   nicotine (NICODERM CQ - dosed in mg/24 hours) patch 21 mg  21 mg Transdermal Daily Rosezetta Schlatter T, MD   21 mg at 11/09/22 0951   ondansetron (ZOFRAN) injection 4 mg  4 mg Intravenous Q6H PRN Annice Needy, MD       ondansetron [facility]) injection 4 mg  4 mg Intravenous Q6H PRN Georgiana Spinner, NP       oxyCODONE-acetaminophen (PERCOCET/ROXICET) 5-325 MG per tablet 1 tablet  1 tablet Oral Q4H PRN Annice Needy, MD   1 tablet at 11/09/22 0943   pantoprazole (PROTONIX) EC tablet 40 mg  40 mg Oral Daily Annice Needy, MD   40 mg at 11/09/22 0944   rosuvastatin (CRESTOR) tablet 5 mg  5 mg Oral QHS Annice Needy, MD   5 mg at 11/08/22 2038   senna-docusate (Senokot-S) tablet 1 tablet  1 tablet Oral BID Rosezetta Schlatter T, MD   1 tablet at 11/09/22 1259   umeclidinium bromide (INCRUSE ELLIPTA) 62.5 MCG/ACT 1 puff  1 puff Inhalation Daily Annice Needy, MD   1 puff at 11/09/22 0944   vancomycin (VANCOCIN) IVPB 1000 mg/200 mL premix  1,000 mg Intravenous Q12H Rosezetta Schlatter T, MD 200 mL/hr at 11/09/22 1000 1,000 mg at 11/09/22 1000   venlafaxine XR (EFFEXOR-XR) 24 hr  capsule 225 mg  225 mg Oral Daily Annice Needy, MD   225 mg at 11/09/22 0981     Discharge Medications: Please see discharge summary for a list of discharge medications.  Relevant Imaging Results:  Relevant Lab Results:   Additional Information SS #: [id number]  Muriah Harsha E Chandi Nicklin, LCSW

## 2022-11-09 NOTE — Progress Notes (Signed)
  Progress Note   Patient: Timothy [name]. ZOX:[id number] DOB: 05/06/1961 DOA: 11/02/2022     7 DOS: the patient was seen and examined on 11/09/2022  At about 4 PM today rapid response was called on patient.  Patient apparently became altered with jerking movement associated with dilatation of the left pupil that was sluggishly reactive to light. I saw the patient at bedside evaluated and code stroke was called and patient was taken to the CT scan for head CT. Patient was moved immediately to stepdown for closer monitoring thereafter. Case discussed with telemetry neurologist as well as intensivist Dr. Belia Heman. Patient's initial blood pressure was 164/94 respiratory rate 84.  Because patient was altered he was placed on nonrebreather mask at the time of my assessment saturation was high 90s to 100%. CT scan of the brain reviewed by me as well as neurologist and radiologist did not show any acute stroke or hemorrhage. Due to jerking movement patient has been placed on IV Keppra for possible seizure episode.  At this point neurologist does not believe this is stroke as patient is moving all extremities.  There is possible concerns of encephalopathy possibly drug related or from seizure episode. We will consider MRI tomorrow as well as EEG to complete workup. I spent a total of 45 minutes of critical care time on patient during rapid response as well as when patient was transferred to the ICU including time spent in discussion with intensivist as well as neurologist.  Author: Loyce Dys, MD 11/09/2022 5:18 PM  For on call review [email or web address].

## 2022-11-09 NOTE — Consult Note (Signed)
NAME:  Timothy [name]., MRN:  [id number], DOB:  July 29, 1960, LOS: 7 ADMISSION DATE:  11/02/2022, CONSULTATION DATE:  11/09/22 REFERRING MD:  Dr. Meriam Sprague, CHIEF COMPLAINT:  Altered Mental Status   Brief Pt Description / Synopsis:  62 y.o. 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  Hospital course complicated Acute Metabolic Encephalopathy due to relative Hypoglycemia.  History of Present Illness:  Timothy [name]. is a 62 y.o. male with medical history significant of hypertension, hyperlipidemia, diabetes mellitus, COPD on 2-3 L oxygen (2 L in the daytime, and 3L in night), diastolic CHF, depression with anxiety, PTSD, bipolar, DVT on Eliquis, who presented to [facility] ED on 11/02/22  from podiatry office with bilateral foot ulcer and pain, unresponsive to outpatient abx.    Patient has bilateral foot ulcer for more than 2 months.  Patient has been following up with podiatrist and is currently taking doxycycline, but no improvement.  Patient was seen by Dr. Alberteen Spindle of podiatry today, and found to have worsening bilateral foot ulcers.  Dr. Alberteen Spindle recommended to admit patient to hospital for IV antibiotics treatment.  Patient has a large and deep ulcer in right heel  and a small ulcer in the left plantar area.  He has pain in both feet, worse on the right foot.  The pain is constant, moderate to severe, sharp, nonradiating.  Denies fever or chills.  Patient has mild dry cough due to COPD, denies shortness of breath or chest pain.  Has nausea, no vomiting, diarrhea or abdominal pain.  No symptoms of UTI.   Data reviewed independently and ED Course: pt was found to have WBC 14.8, lactic acid 1.3, BNP 72.6, GFR> 60, pseudohyponatremia, blood sugar 437 (anion gap 17, bicarbonate of 22, beta hydroxybutyric acid 2.55, pH 7.43 on VBG), temperature normal, blood pressure 99/67, heart rate 99, RR 18, oxygen saturation 98% on room air.  Patient is  admitted to telemetry bed as inpatient.  Dr. Ether Griffins of podiatry and Dr. Wyn Quaker of [facility] are consulted.  Hospitalist asked to admit for further workup and treatment.  Please see "Significant Hospital Events" section below for full detailed hospital course.  Pertinent  Medical History   Past Medical History:  Diagnosis Date   Anxiety    COPD (chronic obstructive pulmonary disease) (HCC)    Depression    Diabetes mellitus without complication (HCC)    GERD (gastroesophageal reflux disease)    Hypercholesteremia    Hypertension    Pneumonia    PTSD (post-traumatic stress disorder)     Micro Data:  5/17: HIV screen>> nonreactive 5/17: Blood culture x 2>> no growth  Antimicrobials:   Anti-infectives (From admission, onward)    Start     Dose/Rate Route Frequency Ordered Stop   11/03/22 0600  vancomycin (VANCOCIN) IVPB 1000 mg/200 mL premix        1,000 mg 200 mL/hr over 60 Minutes Intravenous Every 12 hours 11/02/22 1316 11/09/22 2359   11/02/22 1330  vancomycin (VANCOREADY) IVPB 750 mg/150 mL        750 mg 150 mL/hr over 60 Minutes Intravenous  Once 11/02/22 1316 11/02/22 1750   11/02/22 1300  cefTRIAXone (ROCEPHIN) 2 g in sodium chloride 0.9 % 100 mL IVPB        2 g 200 mL/hr over 30 Minutes Intravenous Every 24 hours 11/02/22 1255 11/09/22 1549   11/02/22 1300  metroNIDAZOLE (FLAGYL) IVPB 500 mg  500 mg 100 mL/hr over 60 Minutes Intravenous Every 12 hours 11/02/22 1255 11/09/22 1607   11/02/22 1200  vancomycin (VANCOCIN) IVPB 1000 mg/200 mL premix        1,000 mg 200 mL/hr over 60 Minutes Intravenous  Once 11/02/22 1150 11/02/22 1333        Significant Hospital Events: Including procedures, antibiotic start and stop dates in addition to other pertinent events   5/17: Admitted by [facility]. 05/17: MRI (+) osteomyelitis R calcaneus. Podiatry recs for BKA. Vasc surgery planning for RLE angio on Monday.  05/18-05/19 (weekend): stable 05/20: RLE angiography w/ angioplasty  today w/ Dr Wyn Quaker 05/21: awaiting amputation tomorrow  05/22: Below knee amputation of RLE. 05/24: Rapid response called due to AMS with jerking movements and associated dilatation of the left pupil which progressed to unresponsiveness. Code Stroke called and transferred to ICU. CT head negative. After arrival to ICU, awake but severely agitated/combative/aggressive at staff.  Evaluated by Teleneurology, deemed not a stroke.  CBG 75, but AMS resolved following administration of D50 concerning for relative hypoglycemia.   Interim History / Subjective:  As outlined above  Objective   Blood pressure (!) 163/132, pulse (!) 148, temperature 97.7 F (36.5 C), temperature source Oral, resp. rate 18, height 5\' 10"  (1.778 m), weight 89.4 kg, SpO2 (!) 73 %.        Intake/Output Summary (Last 24 hours) at 11/09/2022 1636 Last data filed at 11/09/2022 1521 Gross per 24 hour  Intake 1800.92 ml  Output 2825 ml  Net -1024.08 ml   Filed Weights   11/02/22 1103 11/07/22 0644  Weight: 89.4 kg 89.4 kg    Examination: Exam is limited as pt is noncompliant and attempting to hit staff General: Acute on chronically ill appearing male, laying in bed, now agitated at staff, but in NAD HENT: Atraumatic, normocephalic, neck supple, no JVD Lungs: UTA Cardiovascular: Regular rate and rhythm, NSR on telemetry Abdomen: Nondistended, no guarding Extremities: right BKA (dressing clean dry and intact), normal bulk and tone Neuro: Awake and severely agitated/combative/striking out at staff.  Moves all extremities but refusing to follow commands, no focal deficits noted, pupils unequal (left dilated) GU: Deferred  Resolved Hospital Problem list     Assessment & Plan:   #Altered Mental Status due to relative Hypoglycemia (CBG 75) ~ RESOLVED following D50  CT Head negative for acute intracranial abnormality Evaluate by Teleneurology ~ deemed NOT a Stroke -Provide supportive care -Promote normal sleep/wake cycle  and family presence -Avoid sedating meds as able -Maintain Euglycemia -Will cancel EEG for now  #Sepsis due to Osteomyelitis of Right Calcaneus & Right Diabetic Ulcer (POA) #Left foot Stage 3 Diabetic/pressure ulcer (POA) S/p Right BKA on 5/22 -Monitor fever curve -Trend WBC's & Procalcitonin -Follow cultures as above -Continue empiric Ceftriaxone, Flagyl, and Vancomycin pending cultures & sensitivities  #Diabetes Mellitus #Relative Hypoglycemia -CBG's q4h; Target range of 140 to 180 (check CBG's q2h for now until consistently stable) -SSI -Follow ICU Hypo/Hyperglycemia protocol -Will half Semglee, stop scheduled 8 units Novolog ~ just continue SSI for now       Best Practice (right click and "Reselect all SmartList Selections" daily)   Diet/type: NPO DVT prophylaxis: prophylactic heparin  GI prophylaxis: PPI Lines: N/A Foley:  N/A Code Status:  full code Last date of multidisciplinary goals of care discussion [N/A]  Labs   CBC: Recent Labs  Lab 11/03/22 0516 11/04/22 0443 11/05/22 0451 11/06/22 0532 11/08/22 0412  WBC 12.1* 11.6* 12.0* 9.8 11.6*  HGB  9.7* 8.9* 8.9* 9.6* 9.7*  HCT 29.6* 27.1* 27.8* 29.9* 31.3*  MCV 86.8 87.4 88.5 89.0 90.2  PLT 318 302 333 379 517*    Basic Metabolic Panel: Recent Labs  Lab 11/03/22 0516 11/04/22 0443 11/05/22 0451 11/06/22 0532 11/08/22 0412 11/09/22 0426  NA 130* 131* 133* 135 134*  --   K 3.3* 3.0* 3.3* 3.5 4.1  --   CL 98 97* 94* 97* 97*  --   CO2 24 27 27 30 27   --   GLUCOSE 231* 179* 203* 132* 120*  --   BUN 11 7 6  <5* 10  --   CREATININE 0.75 0.70 0.82 0.74 0.73 0.82  CALCIUM 7.6* 7.5* 7.7* 8.1* 8.2*  --    GFR: Estimated Creatinine Clearance: 107.9 mL/min (by C-G formula based on SCr of 0.82 mg/dL). Recent Labs  Lab 11/02/22 1825 11/03/22 0516 11/04/22 0443 11/05/22 0451 11/06/22 0532 11/08/22 0412  WBC  --    < > 11.6* 12.0* 9.8 11.6*  LATICACIDVEN 1.1  --   --   --   --   --    < > = values in  this interval not displayed.    Liver Function Tests: No results for input(s): "AST", "ALT", "ALKPHOS", "BILITOT", "PROT", "ALBUMIN" in the last 168 hours. No results for input(s): "LIPASE", "AMYLASE" in the last 168 hours. No results for input(s): "AMMONIA" in the last 168 hours.  ABG    Component Value Date/Time   PHART 7.351 08/15/2020 1545   PCO2ART 51.3 (H) 08/15/2020 1545   PO2ART 57.5 (L) 08/15/2020 1545   HCO3 25.9 11/02/2022 1223   TCO2 31 08/15/2020 1446   O2SAT 78.9 11/02/2022 1223     Coagulation Profile: Recent Labs  Lab 11/02/22 1825  INR 1.3*    Cardiac Enzymes: No results for input(s): "CKTOTAL", "CKMB", "CKMBINDEX", "TROPONINI" in the last 168 hours.  HbA1C: Hgb A1c MFr Bld  Date/Time Value Ref Range Status  08/09/2021 12:46 PM 11.9 (H) 4.8 - 5.6 % Final    Comment:    (NOTE)         Prediabetes: 5.7 - 6.4         Diabetes: >6.4         Glycemic control for adults with diabetes: <7.0   08/08/2021 09:32 PM 11.9 (H) 4.8 - 5.6 % Final    Comment:    (NOTE) **Verified by repeat analysis**         Prediabetes: 5.7 - 6.4         Diabetes: >6.4         Glycemic control for adults with diabetes: <7.0     CBG: Recent Labs  Lab 11/08/22 1735 11/08/22 2203 11/09/22 0806 11/09/22 1131 11/09/22 1610  GLUCAP 105* 174* 102* 137* 75    Review of Systems:   Unable to assess due to AMS  Past Medical History:  He,  has a past medical history of Anxiety, COPD (chronic obstructive pulmonary disease) (HCC), Depression, Diabetes mellitus without complication (HCC), GERD (gastroesophageal reflux disease), Hypercholesteremia, Hypertension, Pneumonia, and PTSD (post-traumatic stress disorder).   Surgical History:   Past Surgical History:  Procedure Laterality Date   AMPUTATION Left 03/22/2020   Procedure: AMPUTATION RAY-1st Ray Left Foot;  Surgeon: Linus Galas, [name];  Location: [facility] ORS;  Service: Podiatry;  Laterality: Left;   AMPUTATION Right 11/07/2022    Procedure: AMPUTATION BELOW KNEE;  Surgeon: Annice Needy, MD;  Location: [facility] ORS;  Service: Vascular;  Laterality: Right;  AMPUTATION TOE Left 03/16/2020   Procedure: AMPUTATION GREAT TOE;  Surgeon: Linus Galas, [name];  Location: [facility] ORS;  Service: Podiatry;  Laterality: Left;   AMPUTATION TOE Left 04/06/2022   Procedure: AMPUTATION TOE;  Surgeon: Linus Galas, [name];  Location: [facility] ORS;  Service: Podiatry;  Laterality: Left;   COLONOSCOPY WITH PROPOFOL N/A 12/22/2019   Procedure: COLONOSCOPY WITH PROPOFOL;  Surgeon: Midge Minium, MD;  Location: [facility] ENDOSCOPY;  Service: Endoscopy;  Laterality: N/A;   INCISION AND DRAINAGE ABSCESS Left 08/11/2021   Procedure: INCISION AND DRAINAGE ABSCESS;  Surgeon: Linus Salmons, MD;  Location: [facility] ORS;  Service: ENT;  Laterality: Left;   LOWER EXTREMITY ANGIOGRAPHY Left 03/18/2020   Procedure: Lower Extremity Angiography;  Surgeon: Renford Dills, MD;  Location: [facility] INVASIVE CV LAB;  Service: Cardiovascular;  Laterality: Left;   LOWER EXTREMITY ANGIOGRAPHY Right 11/05/2022   Procedure: Lower Extremity Angiography;  Surgeon: Annice Needy, MD;  Location: [facility] INVASIVE CV LAB;  Service: Cardiovascular;  Laterality: Right;   TONSILLECTOMY     VIDEO ASSISTED THORACOSCOPY (VATS)/DECORTICATION Left 08/15/2020   Procedure: VIDEO ASSISTED THORACOSCOPY (VATS)/DECORTICATION;  Surgeon: Corliss Skains, MD;  Location: [facility] OR;  Service: Thoracic;  Laterality: Left;   WISDOM TOOTH EXTRACTION       Social History:   reports that he has been smoking cigarettes. He has a 41.00 pack-year smoking history. He has never used smokeless tobacco. He reports that he does not currently use alcohol after a past usage of about 6.0 standard drinks of alcohol per week. He reports that he does not currently use drugs.   Family History:  His Family history is unknown by patient.   Allergies Allergies  Allergen Reactions   Bee Venom Anaphylaxis   Cheese Anaphylaxis, Swelling  and Other (See Comments)    Blue Cheese only   Coffea Arabica Shortness Of Breath   Coffee Flavor Shortness Of Breath     Home Medications  Prior to Admission medications   Medication Sig Start Date End Date Taking? Authorizing Provider  pantoprazole (PROTONIX) 40 MG tablet TAKE ONE TABLET (40MG  TOTAL) BY MOUTH ONCE DAILY. Patient taking differently: Take 40 mg by mouth daily. 01/16/21  Yes   venlafaxine XR (EFFEXOR XR) 150 MG 24 hr capsule Take 1 capsule (150 mg total) by mouth daily with breakfast. Patient to take medication with 75 mg tablet for (225 mg total). 08/10/22 11/08/22 Yes Nwoko, Tommas Olp, PA  venlafaxine XR (EFFEXOR-XR) 75 MG 24 hr capsule Take 1 capsule (75 mg total) by mouth daily. Patient to take medication with 150 mg tablet for (225 mg total). 08/10/22 08/10/23 Yes Nwoko, Tommas Olp, PA  acetaminophen (TYLENOL) 650 MG CR tablet Take 1,300 mg by mouth every 8 (eight) hours as needed for pain. Patient not taking: Reported on 11/08/2022    [provider]  albuterol (VENTOLIN HFA) 108 (90 Base) MCG/ACT inhaler INHALE 1 PUFF INTO THE LUNGS ONCE EVERY 6 HOURS AS NEEDED FOR SHORTNESS OF BREATH OR WHEEZING. Patient not taking: Reported on 11/08/2022 01/16/21     amLODipine (NORVASC) 5 MG tablet Take 1 tablet (5 mg total) by mouth once daily. Patient not taking: Reported on 11/08/2022 01/16/21     apixaban (ELIQUIS) 5 MG TABS tablet TAKE ONE TABLET (5MG  TOTAL) BY MOUTH TWICE DAILY. Patient not taking: Reported on 11/08/2022 01/16/21     ARIPiprazole (ABILIFY) 10 MG tablet Take 1 tablet (10 mg total) by mouth daily. Patient not taking: Reported on 11/08/2022 08/10/22 11/08/22  Nwoko,  Uchenna E, PA  blood glucose meter kit and supplies KIT Dispense based on patient and insurance preference. Use up to four times daily as directed. (FOR ICD-9 250.00, 250.01). Patient not taking: Reported on 11/08/2022 10/15/19   Iloabachie, Chioma E, NP  blood glucose meter kit and supplies KIT Dispense based on  patient and insurance preference. Use up to four times daily as directed. Patient not taking: Reported on 11/08/2022 08/12/21   Pennie Banter, DO  Budeson-Glycopyrrol-Formoterol (BREZTRI AEROSPHERE) 160-9-4.8 MCG/ACT AERO Inhale 2 puffs into the lungs in the morning and at bedtime. Patient not taking: Reported on 11/08/2022 08/09/22   Salena Saner, MD  buPROPion [facility] SR) 150 MG 12 hr tablet Take 150 mg by mouth 2 (two) times daily. Patient not taking: Reported on 11/08/2022    [provider]  cloNIDine (CATAPRES) 0.1 MG tablet Take 1 tablet (0.1 mg total) by mouth at bedtime. Patient not taking: Reported on 11/08/2022 08/10/22 11/08/22  Meta Hatchet, PA  cyanocobalamin (VITAMIN B12) 1000 MCG tablet Take 1,000 mcg by mouth daily. Patient not taking: Reported on 11/08/2022    [provider]  Dextromethorphan-guaiFENesin [facility] DM) 30-600 MG TB12 Take 1 tablet by mouth 2 (two) times daily as needed (cough). Patient not taking: Reported on 08/10/2022 06/27/22   Glenford Bayley, NP  doxycycline (VIBRAMYCIN) 100 MG capsule Take 100 mg by mouth 2 (two) times daily. Patient not taking: Reported on 11/08/2022 10/13/22   [provider]  gabapentin (NEURONTIN) 400 MG capsule TAKE ONE CAPSULE (400MG  TOTAL) BY MOUTH THREE TIMES DAILY. Patient not taking: Reported on 11/08/2022 01/16/21     gentamicin ointment (GARAMYCIN) 0.1 % Apply topically in the morning and at bedtime. To left side of face. Patient not taking: Reported on 11/08/2022 08/12/21   Esaw Grandchild A, DO  glipiZIDE (GLUCOTROL) 10 MG tablet TAKE ONE TABLET (10MG  TOTAL) BY MOUTH TWICE DAILY. Patient not taking: Reported on 11/08/2022 02/16/21   Iloabachie, Chioma E, NP  insulin glargine (LANTUS) 100 UNIT/ML Solostar Pen Inject 20 Units into the skin at bedtime. Patient not taking: Reported on 11/08/2022 08/14/21   Esaw Grandchild A, DO  Insulin Pen Needle (PEN NEEDLES) 32G X 6 MM MISC 1 each by Does not apply  route daily. Patient not taking: Reported on 11/08/2022 08/12/21   Esaw Grandchild A, DO  magnesium oxide (MAG-OX) 400 MG tablet Take 800 mg by mouth 2 (two) times daily. Patient not taking: Reported on 11/08/2022    [provider]  metFORMIN (GLUCOPHAGE) 1000 MG tablet TAKE ONE TABLET (1,000MG  TOTAL) BY MOUTH TWICE DAILY. Patient not taking: Reported on 11/08/2022 01/16/21     metoprolol succinate (TOPROL-XL) 50 MG 24 hr tablet Take 50 mg by mouth daily. Patient not taking: Reported on 11/08/2022 07/21/21   [provider]  rosuvastatin (CRESTOR) 5 MG tablet TAKE ONE TABLET (5MG  TOTAL) BY MOUTH ONCE DAILY. Patient not taking: Reported on 11/08/2022 01/16/21     citalopram (CELEXA) 20 MG tablet Take 1 tablet (20 mg total) by mouth daily. Patient taking differently: Take 20 mg by mouth at bedtime. 11/09/20 01/17/21  Rolm Gala, NP     Critical care time: 45 minutes     Harlon Ditty, AGACNP-BC [facility] Prefer epic messenger for cross cover needs If after hours, please call E-link

## 2022-11-09 NOTE — Progress Notes (Signed)
   11/09/22 1600  Spiritual Encounters  Type of Visit Initial  Care provided to: Patient  Referral source Code page  Reason for visit Code  OnCall Visit Yes   Chaplain responded to Rapid response code. No family present. Patient attended to by care team.

## 2022-11-09 NOTE — TOC PASRR Note (Signed)
RE: Timothy [name]. Robynn Pane. Date of Birth:  [birth year] Date: 11/09/22   To Whom It May Concern:  Please be advised that the above-named patient will require a short-term nursing home stay - anticipated 30 days or less for rehabilitation and strengthening.  The plan is for return home.

## 2022-11-09 NOTE — TOC Progression Note (Addendum)
Transition of Care (TOC) - Progression Note    Patient Details  Name: Timothy [name]. MRN: [id number] Date of Birth: 1961/06/02  Transition of Care [employer]) CM/SW Contact  Liliana Cline, LCSW Phone Number: 11/09/2022, 1:31 PM  Clinical Narrative:    CSW spoke to patient regarding PT/OT recs for SNF. Explained Medicaid requirements of 30 day stay and taking patient's check while he is in STR. Patient verbalized understanding and states he is interested in SNF for STR. Work up started. PASRR PENDING. Information has been uploaded.   4:02- 30 day note uploaded. PASRR still pending.  Expected Discharge Plan:  (TBD) Barriers to Discharge: Continued Medical Work up  Expected Discharge Plan and Services     Post Acute Care Choice:  (TBD) Living arrangements for the past 2 months: Single Family Home                                       Social Determinants of Health (SDOH) Interventions SDOH Screenings   Food Insecurity: No Food Insecurity (11/02/2022)  Housing: Low Risk  (11/02/2022)  Transportation Needs: No Transportation Needs (11/02/2022)  Utilities: Not At Risk (11/02/2022)  Alcohol Screen: Low Risk  (02/10/2020)  Depression (PHQ2-9): High Risk (10/03/2022)  Financial Resource Strain: Medium Risk (05/28/2022)  Physical Activity: Inactive (05/28/2022)  Social Connections: Socially Isolated (05/28/2022)  Stress: Stress Concern Present (05/28/2022)  Tobacco Use: High Risk (11/08/2022)    Readmission Risk Interventions    11/06/2022   12:06 PM  Readmission Risk Prevention Plan  Transportation Screening Complete  PCP or Specialist Appt within 3-5 Days Complete  Social Work Consult for Recovery Care Planning/Counseling Complete  Palliative Care Screening Not Applicable  Medication Review Ocean[employer]) Referral to Pharmacy

## 2022-11-09 NOTE — Progress Notes (Addendum)
Telestroke RN Code Stroke Note  1628- LKW 1519- RRT was called at 1600 for sudden unresponsiveness. Pt was already in CT when code stroke activated.  1630- neurology was paged 1632- Dr Pearlean Brownie on camera for stroke assessment.  1640-Pt was transferred to ICU to complete assessment, attending at bedside 1650- Code stroke complete.  MRS 3 NIH 2 - incorrect month/age and dysarthria

## 2022-11-09 NOTE — Progress Notes (Signed)
[facility] Telemedicine Consult   Requesting Provider: Rosezetta Schlatter Consult Participants: patient, bedside Rn, [facility] RN Misty Stanley Location of the provider: [facility] Location of the patient: [facility] ICU  This consult was provided via telemedicine with 2-way video and audio communication. The patient/family was informed that care would be provided in this way and agreed to receive care in this manner.    Chief Complaint: Altered mental status code stroke  HPI: Timothy [name] is a 62 year old Caucasian malewith medical history significant of hypertension, hyperlipidemia, diabetes mellitus, COPD on 2-3 L oxygen (2 L in the daytime, and 3L in night), diastolic CHF, depression with anxiety, PTSD, bipolar, DVT on Eliquis, who presented on 11/02/2022 with bilateral foot ulcer and pain.  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  He was being treated with antibiotics and pain medication and was doing well and was about to be discharged home tomorrow.  Today at 1519 he developed sudden onset of altered mental status.  He was found to be unresponsive and not speaking and not following commands.  Code stroke was activated.  There was no weakness seizure-like activity, focal extremity weakness or facial weakness noted.  Patient was taken to CT scan where he became slightly more responsive and started moving all 4 extremities but was agitated.  When examined by me upon returning back to the ICU patient was initially irritable but seem to settle down and spoke clearly stated he wanted to be left alone.  He was able to move all 4 extremities well without focal weakness.  Noncontrast CT scan of the head was personally reviewed by me and showed no acute abnormality.  His vital signs are stable.  Blood sugar was fine.  Oxygen sats were satisfactory.  Patient had been using Vicodin, ceftriaxone, vancomycin, metronidazole, Effexor, Wellbutrin and aripiprazole. Lab work from this morning BMP and  CBC are fairly unremarkable.  His vital signs are stable and he is afebrile   LKW: 1519 tpa given?: No, not felt to be a stroke IR Thrombectomy? No, not felt to be LVO Modified Rankin Scale: 3-Moderate disability-requires help but walks WITHOUT assistance Time of teleneurologist evaluation: 1635  Exam: Vitals:   11/09/22 0808 11/09/22 1614  BP: 128/83 (!) 163/132  Pulse: 73 (!) 148  Resp: 18   Temp: 98.1 F (36.7 C) 97.7 F (36.5 C)  SpO2: 100% (!) 73%    General: Mildly obese pleasant Caucasian male.  1A: Level of Consciousness - 0 1B: Ask Month and Age - 62 1C: 'Blink Eyes' & 'Squeeze Hands' - 0 2: Test Horizontal Extraocular Movements - 0 3: Test Visual Fields - 0 4: Test Facial Palsy - 0 5A: Test Left Arm Motor Drift - 0 5B: Test Right Arm Motor Drift - 0 6A: Test Left Leg Motor Drift - 0 6B: Test Right Leg Motor Drift - 0 7: Test Limb Ataxia - 0 8: Test Sensation - 0 9: Test Language/Aphasia- 0 10: Test Dysarthria - 1 11: Test Extinction/Inattention - 0 NIHSS score: 2  Patient appears quite restless but appeared to calm down after nonrebreather was removed.  He is having some intermittent myoclonic jerks in upper extremities greater than lower extremities but is able to move all 4 extremities without any focal deficits.  Imaging Reviewed: CT head noncontrast study shows no acute abnormality.  Labs reviewed in epic and pertinent values follow: BMP and CBC unremarkable   Assessment: 62 year old Caucasian male with sudden onset of altered mental status and decreased responsiveness which  appears to be improving.  No clear lateralizing focal signs to suggest stroke.  Etiology is indeterminate but likely encephalopathy which may be multifactorial related to medication effect of ceftriaxone, narcotics as well as his infection and recent surgery.   Acute stroke is unlikely due to absence of focal neurological deficits.  Patient is not a candidate for thrombolysis due to  recent surgery and low suspicion for stroke.   Patient's clinical presentation is not consistent with an LVO and thus we will not obtain CT angiogram. Recommendations: Check EEG.  Hold medications like Wellbutrin which may lower seizure threshold.  Repeat CMP CBC.  Consider switching ceftriaxone to alternative antibiotics as it may contribute to encephalopathy.  Minimize narcotic pain medications.  The patient mental status has not resolved may consider getting an MRI.  The patient has clear-cut weakness seizure activity may need to add anticonvulsants.  Neurohospitalist team will follow tomorrow on consults. Discussed with Dr. Criss Alvine and patient's RN at the bedside.   This patient is critically ill and at significant risk of neurological worsening, death and care requires constant monitoring of vital signs, hemodynamics,respiratory and cardiac monitoring, extensive review of multiple databases, frequent neurological assessment, discussion with family, other specialists and medical decision making of high complexity.I have made any additions or clarifications directly to the above note.This critical care time does not reflect procedure time, or teaching time or supervisory time of PA/NP/Med Resident etc but could involve care discussion time.  I spent 30 minutes of neurocritical care time  in the care of  this patient.      Timothy Heady, MD, MD [facility] [phone]  If 7pm- 7am, please page neurology on call as listed in AMION.

## 2022-11-10 DIAGNOSIS — J449 Chronic obstructive pulmonary disease, unspecified: Secondary | ICD-10-CM | POA: Diagnosis not present

## 2022-11-10 DIAGNOSIS — R4182 Altered mental status, unspecified: Secondary | ICD-10-CM | POA: Diagnosis not present

## 2022-11-10 DIAGNOSIS — M868X7 Other osteomyelitis, ankle and foot: Secondary | ICD-10-CM | POA: Diagnosis not present

## 2022-11-10 DIAGNOSIS — I5032 Chronic diastolic (congestive) heart failure: Secondary | ICD-10-CM | POA: Diagnosis not present

## 2022-11-10 DIAGNOSIS — M869 Osteomyelitis, unspecified: Secondary | ICD-10-CM | POA: Diagnosis not present

## 2022-11-10 DIAGNOSIS — Z7189 Other specified counseling: Secondary | ICD-10-CM | POA: Diagnosis not present

## 2022-11-10 DIAGNOSIS — Z515 Encounter for palliative care: Secondary | ICD-10-CM

## 2022-11-10 LAB — CBC WITH DIFFERENTIAL/PLATELET
Abs Immature Granulocytes: 0.13 10*3/uL — ABNORMAL HIGH (ref 0.00–0.07)
Basophils Absolute: 0.1 10*3/uL (ref 0.0–0.1)
Basophils Relative: 1 %
Eosinophils Absolute: 0.2 10*3/uL (ref 0.0–0.5)
Eosinophils Relative: 3 %
HCT: 30.6 % — ABNORMAL LOW (ref 39.0–52.0)
Hemoglobin: 9.3 g/dL — ABNORMAL LOW (ref 13.0–17.0)
Immature Granulocytes: 1 %
Lymphocytes Relative: 18 %
Lymphs Abs: 1.7 10*3/uL (ref 0.7–4.0)
MCH: 27.8 pg (ref 26.0–34.0)
MCHC: 30.4 g/dL (ref 30.0–36.0)
MCV: 91.3 fL (ref 80.0–100.0)
Monocytes Absolute: 0.8 10*3/uL (ref 0.1–1.0)
Monocytes Relative: 9 %
Neutro Abs: 6.4 10*3/uL (ref 1.7–7.7)
Neutrophils Relative %: 68 %
Platelets: 587 10*3/uL — ABNORMAL HIGH (ref 150–400)
RBC: 3.35 MIL/uL — ABNORMAL LOW (ref 4.22–5.81)
RDW: 13.2 % (ref 11.5–15.5)
WBC: 9.3 10*3/uL (ref 4.0–10.5)
nRBC: 0 % (ref 0.0–0.2)

## 2022-11-10 LAB — GLUCOSE, CAPILLARY
Glucose-Capillary: 114 mg/dL — ABNORMAL HIGH (ref 70–99)
Glucose-Capillary: 116 mg/dL — ABNORMAL HIGH (ref 70–99)
Glucose-Capillary: 119 mg/dL — ABNORMAL HIGH (ref 70–99)
Glucose-Capillary: 148 mg/dL — ABNORMAL HIGH (ref 70–99)
Glucose-Capillary: 153 mg/dL — ABNORMAL HIGH (ref 70–99)
Glucose-Capillary: 156 mg/dL — ABNORMAL HIGH (ref 70–99)
Glucose-Capillary: 83 mg/dL (ref 70–99)
Glucose-Capillary: 90 mg/dL (ref 70–99)

## 2022-11-10 LAB — BASIC METABOLIC PANEL
Anion gap: 7 (ref 5–15)
BUN: 9 mg/dL (ref 6–20)
CO2: 29 mmol/L (ref 22–32)
Calcium: 8.2 mg/dL — ABNORMAL LOW (ref 8.9–10.3)
Chloride: 101 mmol/L (ref 98–111)
Creatinine, Ser: 0.79 mg/dL (ref 0.61–1.24)
GFR, Estimated: >60 mL/min (ref >60)
Glucose, Bld: 118 mg/dL — ABNORMAL HIGH (ref 70–99)
Potassium: 4.3 mmol/L (ref 3.5–5.1)
Sodium: 137 mmol/L (ref 135–145)

## 2022-11-10 MED ORDER — LACTULOSE 10 GM/15ML PO SOLN
20.0000 g | Freq: Two times a day (BID) | ORAL | Status: DC
  Administered 2022-11-10 – 2022-11-22 (×16): 20 g via ORAL
  Filled 2022-11-10 (×28): qty 30

## 2022-11-10 NOTE — Progress Notes (Signed)
Pt to transfer to 1A-149 per MD order. Report given to [facility] LPN. All belongings sent with pt.

## 2022-11-10 NOTE — Consult Note (Signed)
Consultation Note Date: 11/10/2022   Patient Name: Timothy [name].  DOB: Jun 11, 1961  MRN: [id number]  Age / Sex: 62 y.o., male  PCP: Timothy [name] Referring Physician: Loyce Dys, MD  Reason for Consultation: Establishing goals of care   HPI/Brief Hospital Course: 62 y.o. male  with past medical history of HTN, HLD, T2DM, COPD on 2-3L [place] at baseline, diastolic CHF, depression, anxiety, PTSD, bipolar disorder, DVT on Eliquis admitted after being seen at Podiatry office on 11/02/2022 with bilateral foot ulcer unresponsive to outpatient antibiotics.  MRI (+) osteomyelitis right calcaneus Underwent right BKA 5/22  RRT called 5/24 for possible Code Stroke, ruled out by neurology, found to be hypoglycemic and responded to administration of D50 with improvement in mentation   Palliative medicine was consulted for assisting with goals of care conversations.  Subjective:  Extensive chart review has been completed prior to meeting patient including labs, vital signs, imaging, progress notes, orders, and available advanced directive documents from current and previous encounters.  Visited with Timothy [name] at his bedside. Acknowledges presence in room, able to answer simple orientation questions, drowsy and without ongoing stimulation drifts back to sleep.  Introduced myself as a Publishing rights manager as a member of the palliative care team. Explained palliative medicine is specialized medical care for people living with serious illness. It focuses on providing relief from the symptoms and stress of a serious illness. The goal is to improve quality of life for both the patient and the family.   Timothy [name] can recall our conversations had yesterday. He shares he cannot recall events prior to that when he becomes altered and combative. He is able to share that he came to the hospital due to chronic foot wounds, infection was found in his bone and  recommendations were made for amputation. Shares he has pain but it is relieved with current pain medication regimen.  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 He has not been able to work since 2009 and spends most of his time at home reading or listening to music. He is independent in completing his ADL's but he does not have a license to drive.  He again wishes to appoint Timothy [name] as his person of contact but would like a chance to speak with her more once she has recovered and she herself is discharged from the hospital. We discussed the importance of completing Advanced Directive documentation for which he agreed. We reviewed packet and it has been left at bedside.  He shares that he received a phone call earlier this week stating that he is not able to return to the home he was currently living in with the roommate he spoke about during our conversation yesterday. He is unsure where he would live once he is discharged from the hospital but is open to facility placement if necessary. Will share this information with TOC.  During our conversations, Timothy [name] easily drifts off to sleep, mentions seeing other people in the room that are not present. Will need ongoing follow-up and recommendations of AD completion once mentation cleared.  All questions/concerns addressed. PMT will continue to follow and support patient as needed.   Objective: Primary Diagnoses: Present on Admission:  Essential hypertension  Current smoker  Type 2 diabetes mellitus with diabetic neuropathy, unspecified (HCC)  Chronic diastolic CHF (congestive heart failure) (HCC)  COPD (chronic obstructive pulmonary disease) (HCC)  Depression with anxiety  HLD (hyperlipidemia)  (Resolved) DVT (deep venous thrombosis) (HCC)  Sepsis (HCC)  PAD (peripheral artery disease) (HCC)  Osteomyelitis of right foot (HCC)   Vital Signs: BP 116/71 (BP  Location: Left Arm)   Pulse 69   Temp 98.5 F (36.9 C) (Oral)   Resp 15   Ht 5\' 10"  (1.778 m)   Wt 89.4 kg   SpO2 98%   BMI 28.28 kg/m  Pain Scale: 0-10 POSS *See Group Information*: 1-Acceptable,Awake and alert Pain Score: Asleep  IO: Intake/output summary:  Intake/Output Summary (Last 24 hours) at 11/10/2022 1144 Last data filed at 11/10/2022 1100 Gross per 24 hour  Intake 3992.09 ml  Output 500 ml  Net 3492.09 ml    LBM: Last BM Date : 11/01/22 Baseline Weight: Weight: 89.4 kg Most recent weight: Weight: 89.4 kg      Assessment and Plan  SUMMARY OF RECOMMENDATIONS   Full Code Recommend completion of AD once mentation improved Attempted to connect with TOC to share concerns about returning home PMT to continue to follow for ongoing needs and support  Discussed With: Timothy [name]   Thank you for this consult and allowing Palliative Medicine to participate in the care of Timothy [name]. Palliative medicine will continue to follow and assist as needed.   Time Total: 75 minutes  Time spent includes: Detailed review of medical records (labs, imaging, vital signs), medically appropriate exam (mental status, respiratory, cardiac, skin), discussed with treatment team, counseling and educating patient, family and [name], documenting clinical information, medication management and coordination of care.   Signed by: Timothy Deed, DNP, AGNP-C Palliative Medicine    Please contact Palliative Medicine Team phone at [phone] for questions and concerns.  For individual provider: See Timothy [name]

## 2022-11-10 NOTE — Progress Notes (Signed)
Code stroke was called yesterday due to AMS. Glucose was disconvered to be 24.   Today, he is awake, alert, oriented with no drift.  No testing needed from a neurological perspctive, no need for EEG.   Glucose management per primary team.   Please call with further questions or concerns.   Ritta Slot, MD [facility] [phone]  If 7pm- 7am, please page neurology on call as listed in AMION.

## 2022-11-10 NOTE — Progress Notes (Signed)
Progress Note   Patient: Timothy [name]. ZOX:[id number] DOB: 1961/05/23 DOA: 11/02/2022     8 DOS: the patient was seen and examined on 11/10/2022     Subjective:    Patient seen and examined at bedside this morning Patient more awake today Denies nausea vomiting chest pain abdominal pain or cough Patient will need skilled nursing facility/rehab placement    Brief Narrative / Hospital Course:  Timothy [name]. is a 62 y.o. male with medical history significant of hypertension, hyperlipidemia, diabetes mellitus, COPD on 2-3 L oxygen (2 L in the daytime, and 3L in night), diastolic CHF, depression with anxiety, PTSD, bipolar, DVT on Eliquis, who presents from podiatry office with bilateral foot ulcer and pain, unresponsive to outpatient abx.  Patient has a large and deep ulcer in right heel  and a small ulcer in the left plantar area.  05/17: MRI (+) osteomyelitis R calcaneus. Podiatry recs for BKA. Vasc surgery planning for RLE angio on Monday.  05/18-05/19 (weekend): stable 05/20: RLE angiography w/ angioplasty today w/ Dr Wyn Quaker 05/21: awaiting amputation tomorrow    Consultants:  Podiatry Vascular Surgery    Procedures: 11/05/2022 RLE angiography w/ angioplasty 11/07/2022-right BKA done           ASSESSMENT & PLAN:   Principal Problem:   Osteomyelitis of right foot (HCC) Active Problems:   Sepsis (HCC)   Type 2 diabetes mellitus with diabetic neuropathy, unspecified (HCC)   Essential hypertension   Chronic diastolic CHF (congestive heart failure) (HCC)   COPD (chronic obstructive pulmonary disease) (HCC)   HLD (hyperlipidemia)   DVT (deep venous thrombosis) (HCC)   Current smoker   Depression with anxiety   PAD (peripheral artery disease) (HCC)   Sepsis due to osteomyelitis of right calcaneus and R diabetic ulcer present on admission  L foot stage 3 diabetic/pressure ulcer present on admission Patient meets criteria for sepsis with WBC 14.8 and, heart rate  99, lactic acid normal 1.3. Currently on vancomycin, Flagyl, Rocephin most likely patient will not need any antibiotic at discharge since the affected limb have been amputated PRN Zofran for nausea tylenol, Percocet for pain Blood cultures x 2 wound care consult BKA vascular surgeon done on 11/07/2022 PT OT on board   Acute metabolic encephalopathy secondary to hypoglycemia Hypoglycemia improved Rapid response was called on patient on 11/09/2022 after patient was found to be having altered mental status with some jerky posturing, patient was later found to have glucose of 75 which later down trended to 24 and after receiving dextrose patient suddenly became more awake Neurologist has seen patient and does not recommend any further workup for stroke or seizure as this is explained by hypoglycemia Keppra therefore discontinued  Type 2 diabetes mellitus with diabetic neuropathy, unspecified (HCC): Patient has blood sugar 437, anion gap 17, hydroxybutyric acid 2.55, but bicarbonate is 22, pH 7.43 on VBG, patient may be on early stage of developing DKA on admission SSI Glargine insulin 44 units daily   Hypokalemia Replace as needed Monitor BMP   Essential hypertension IV hydralazine as needed held amlodipine and metoprolol since patient at risk of developing hypotension but BP has improved, restarted metoprolol  Continue home clonidine   Chronic diastolic CHF (congestive heart failure) (HCC):  2D echo on 06/07/2021 showed EF of 55 to 60% with grade 1 diastolic dysfunction.  Patient does not have leg edema JVD.  CHF is compensated.  BNP normal 72.6 Watch volume status closely   COPD (chronic obstructive pulmonary  disease) [facility]): Stable Bronchodilators   HLD (hyperlipidemia) Crestor   History of DVT (deep venous thrombosis) (HCC) in March 2022 Used to be on elquis but according to patient he is no longer on eliquis. Records show repeat ultrasound in Dec 2022 showed resolution of DVT    Current smoker Nicotine patch   Depression with anxiety Continue home medications   PAD (peripheral artery disease) (HCC) s/p  RLE angiogram 11/05/2022 Statin, Plavix      DVT prophylaxis: Heparin     Current Admission Status: inpatient     TOC needs / Dispo plan: Skilled nursing facility/rehab   Barriers to discharge / significant pending items: After clearance by vascular surgeon        Physical Exam: Constitutional:      General: He is not in acute distress.    Appearance: Normal appearance.  Cardiovascular:     Rate and Rhythm: Normal rate and regular rhythm.  Pulmonary:     Effort: Pulmonary effort is normal.     Breath sounds: Normal breath sounds.  Abdominal:     General: Abdomen is flat.     Palpations: Abdomen is soft.  Musculoskeletal: Right below-knee amputation stump noted   Neurological:     General: No focal deficit present.     Mental Status: He is alert. Mental status is at baseline.  Psychiatric:        Mood and Affect: Mood normal.        Behavior: Behavior normal.      Data Reviewed: Laboratory results reviewed by me today showed sodium 137 potassium 4.3 creatinine 0.7 hemoglobin 9.3   Time spent: 43 minutes     Vitals:   11/10/22 1000 11/10/22 1100 11/10/22 1150 11/10/22 1249  BP: 116/71 (!) 117/55 (!) 117/55 113/73  Pulse: 69 69  80  Resp: 15 13 20 15   Temp:   97.7 F (36.5 C) 98.2 F (36.8 C)  TempSrc:   Oral   SpO2: 98% 96%  94%  Weight:      Height:        Author: Loyce Dys, MD 11/10/2022 2:45 PM  For on call review [email or web address].

## 2022-11-11 DIAGNOSIS — M8610 Other acute osteomyelitis, unspecified site: Secondary | ICD-10-CM

## 2022-11-11 LAB — GLUCOSE, CAPILLARY
Glucose-Capillary: 131 mg/dL — ABNORMAL HIGH (ref 70–99)
Glucose-Capillary: 165 mg/dL — ABNORMAL HIGH (ref 70–99)
Glucose-Capillary: 100 mg/dL — ABNORMAL HIGH (ref 70–99)
Glucose-Capillary: 124 mg/dL — ABNORMAL HIGH (ref 70–99)

## 2022-11-11 NOTE — Progress Notes (Signed)
Progress Note   Patient: Timothy [name]. ZOX:[id number] DOB: April 30, 1961 DOA: 11/02/2022     9 DOS: the patient was seen and examined on 11/11/2022   Subjective:    Patient seen and examined at bedside this morning Denies any overnight events Denies nausea vomiting chest pain abdominal pain or cough I have informed nursing staff about surgeon's recommendation for dressing to be changed. Make me aware usually the surgeon does not do change for the first time after amputation.  I will defer this to the surgeons decision    Brief Narrative / Hospital Course:  Jhair Feick. is a 62 y.o. male with medical history significant of hypertension, hyperlipidemia, diabetes mellitus, COPD on 2-3 L oxygen (2 L in the daytime, and 3L in night), diastolic CHF, depression with anxiety, PTSD, bipolar, DVT on Eliquis, who presents from podiatry office with bilateral foot ulcer and pain, unresponsive to outpatient abx.  Patient has a large and deep ulcer in right heel  and a small ulcer in the left plantar area.  05/17: MRI (+) osteomyelitis R calcaneus. Podiatry recs for BKA. Vasc surgery planning for RLE angio on Monday.  05/18-05/19 (weekend): stable 05/20: RLE angiography w/ angioplasty today w/ Dr Wyn Quaker 05/21: awaiting amputation tomorrow  5/22: Patient had amputation   Consultants:  Podiatry Vascular Surgery    Procedures: 11/05/2022 RLE angiography w/ angioplasty 11/07/2022-right BKA done           ASSESSMENT & PLAN:   Principal Problem:   Osteomyelitis of right foot (HCC) Active Problems:   Sepsis (HCC)   Type 2 diabetes mellitus with diabetic neuropathy, unspecified (HCC)   Essential hypertension   Chronic diastolic CHF (congestive heart failure) (HCC)   COPD (chronic obstructive pulmonary disease) (HCC)   HLD (hyperlipidemia)   DVT (deep venous thrombosis) (HCC)   Current smoker   Depression with anxiety   PAD (peripheral artery disease) (HCC)   Sepsis due to  osteomyelitis of right calcaneus and R diabetic ulcer present on admission  L foot stage 3 diabetic/pressure ulcer present on admission Patient meets criteria for sepsis with WBC 14.8 and, heart rate 99, lactic acid normal 1.3. Patient completed post 48 hours amputation antibiotics which have been discontinued PRN Zofran for nausea tylenol, Percocet for pain Blood cultures x 2 wound care consult BKA vascular surgeon done on 11/07/2022 PT OT on board   Acute metabolic encephalopathy secondary to hypoglycemia Hypoglycemia improved Rapid response was called on patient on 11/09/2022 after patient was found to be having altered mental status with some jerky posturing, patient was later found to have glucose of 75 which later down trended to 24 and after receiving dextrose patient suddenly became more awake Neurologist has seen patient and does not recommend any further workup for stroke or seizure as this is explained by hypoglycemia Keppra therefore discontinued Continue hypoglycemia protocol   Type 2 diabetes mellitus with diabetic neuropathy, unspecified (HCC): Patient has blood sugar 437, anion gap 17, hydroxybutyric acid 2.55, but bicarbonate is 22, pH 7.43 on VBG, patient may be on early stage of developing DKA on admission SSI Glargine dose adjusted   Hypokalemia Replace as needed Monitor BMP   Essential hypertension IV hydralazine as needed held amlodipine and metoprolol since patient at risk of developing hypotension but BP has improved, restarted metoprolol  Continue home clonidine   Chronic diastolic CHF (congestive heart failure) (HCC):  2D echo on 06/07/2021 showed EF of 55 to 60% with grade 1 diastolic dysfunction.  Patient  does not have leg edema JVD.  CHF is compensated.  BNP normal 72.6 Watch volume status closely   COPD (chronic obstructive pulmonary disease) (HCC): Stable Bronchodilators   HLD (hyperlipidemia) Crestor   History of DVT (deep venous thrombosis)  (HCC) in March 2022 Used to be on elquis but according to patient he is no longer on eliquis. Records show repeat ultrasound in Dec 2022 showed resolution of DVT   Current smoker Nicotine patch   Depression with anxiety Continue home medications   PAD (peripheral artery disease) (HCC) s/p  RLE angiogram 11/05/2022 Statin, Plavix      DVT prophylaxis: Heparin     Current Admission Status: inpatient     TOC needs / Dispo plan: Skilled nursing facility/rehab   Barriers to discharge / significant pending items: After clearance by vascular surgeon        Physical Exam: Constitutional:      General: He is not in acute distress.    Appearance: Normal appearance.  Cardiovascular:     Rate and Rhythm: Normal rate and regular rhythm.  Pulmonary:     Effort: Pulmonary effort is normal.     Breath sounds: Normal breath sounds.  Abdominal:     General: Abdomen is flat.     Palpations: Abdomen is soft.  Musculoskeletal: Right below-knee amputation stump noted   Neurological:     General: No focal deficit present.     Mental Status: He is alert. Mental status is at baseline.  Psychiatric:        Mood and Affect: Mood normal.        Behavior: Behavior normal.      Data Reviewed:  Reviewed patient's most recent laboratory results showing sodium 137 potassium 4.3 glucose 118   time spent: 43 minutes     Vitals:   11/10/22 1249 11/10/22 1653 11/11/22 0008 11/11/22 0735  BP: 113/73 122/85 99/65 122/76  Pulse: 80 (!) 58 72 70  Resp: 15 17 16 16   Temp: 98.2 F (36.8 C) (!) 97.4 F (36.3 C) 98.2 F (36.8 C) 98.1 F (36.7 C)  TempSrc:  Oral    SpO2: 94% 96% 95% 95%  Weight:      Height:         Author: Loyce Dys, MD 11/11/2022 4:14 PM  For on call review [email or web address].

## 2022-11-12 DIAGNOSIS — M869 Osteomyelitis, unspecified: Secondary | ICD-10-CM | POA: Diagnosis not present

## 2022-11-12 LAB — CBC WITH DIFFERENTIAL/PLATELET
Abs Immature Granulocytes: 0.22 10*3/uL — ABNORMAL HIGH (ref 0.00–0.07)
Basophils Absolute: 0.1 10*3/uL (ref 0.0–0.1)
Basophils Relative: 1 %
Eosinophils Absolute: 0.3 10*3/uL (ref 0.0–0.5)
Eosinophils Relative: 3 %
HCT: 32.2 % — ABNORMAL LOW (ref 39.0–52.0)
Hemoglobin: 10.1 g/dL — ABNORMAL LOW (ref 13.0–17.0)
Immature Granulocytes: 3 %
Lymphocytes Relative: 23 %
Lymphs Abs: 1.9 10*3/uL (ref 0.7–4.0)
MCH: 27.9 pg (ref 26.0–34.0)
MCHC: 31.4 g/dL (ref 30.0–36.0)
MCV: 89 fL (ref 80.0–100.0)
Monocytes Absolute: 0.6 10*3/uL (ref 0.1–1.0)
Monocytes Relative: 7 %
Neutro Abs: 5.4 10*3/uL (ref 1.7–7.7)
Neutrophils Relative %: 63 %
Platelets: 659 10*3/uL — ABNORMAL HIGH (ref 150–400)
RBC: 3.62 MIL/uL — ABNORMAL LOW (ref 4.22–5.81)
RDW: 13.2 % (ref 11.5–15.5)
WBC: 8.4 10*3/uL (ref 4.0–10.5)
nRBC: 0 % (ref 0.0–0.2)

## 2022-11-12 LAB — BASIC METABOLIC PANEL
Anion gap: 8 (ref 5–15)
BUN: 15 mg/dL (ref 6–20)
CO2: 27 mmol/L (ref 22–32)
Calcium: 8.4 mg/dL — ABNORMAL LOW (ref 8.9–10.3)
Chloride: 97 mmol/L — ABNORMAL LOW (ref 98–111)
Creatinine, Ser: 0.99 mg/dL (ref 0.61–1.24)
GFR, Estimated: >60 mL/min (ref >60)
Glucose, Bld: 163 mg/dL — ABNORMAL HIGH (ref 70–99)
Potassium: 4.4 mmol/L (ref 3.5–5.1)
Sodium: 132 mmol/L — ABNORMAL LOW (ref 135–145)

## 2022-11-12 LAB — GLUCOSE, CAPILLARY
Glucose-Capillary: 158 mg/dL — ABNORMAL HIGH (ref 70–99)
Glucose-Capillary: 190 mg/dL — ABNORMAL HIGH (ref 70–99)
Glucose-Capillary: 158 mg/dL — ABNORMAL HIGH (ref 70–99)
Glucose-Capillary: 130 mg/dL — ABNORMAL HIGH (ref 70–99)

## 2022-11-12 NOTE — Progress Notes (Signed)
Progress Note   Patient: Timothy [name]. PIR:[id number] DOB: 1960-07-07 DOA: 11/02/2022     10 DOS: the patient was seen and examined on 11/12/2022     Subjective:    Patient seen and examined at bedside this morning Denies any overnight events Denies nausea vomiting chest pain abdominal pain or cough Continues to await placement  Brief Narrative / Hospital Course:  Timothy [name]. is a 62 y.o. male with medical history significant of hypertension, hyperlipidemia, diabetes mellitus, COPD on 2-3 L oxygen (2 L in the daytime, and 3L in night), diastolic CHF, depression with anxiety, PTSD, bipolar, DVT on Eliquis, who presents from podiatry office with bilateral foot ulcer and pain, unresponsive to outpatient abx.  Patient has a large and deep ulcer in right heel  and a small ulcer in the left plantar area.  05/17: MRI (+) osteomyelitis R calcaneus. Podiatry recs for BKA. Vasc surgery planning for RLE angio on Monday.  05/18-05/19 (weekend): stable 05/20: RLE angiography w/ angioplasty today w/ Dr Wyn Quaker 05/21: awaiting amputation tomorrow  5/22: Patient had amputation   Consultants:  Podiatry Vascular Surgery    Procedures: 11/05/2022 RLE angiography w/ angioplasty 11/07/2022-right BKA done           ASSESSMENT & PLAN:   Principal Problem:   Osteomyelitis of right foot (HCC) Active Problems:   Sepsis (HCC)   Type 2 diabetes mellitus with diabetic neuropathy, unspecified (HCC)   Essential hypertension   Chronic diastolic CHF (congestive heart failure) (HCC)   COPD (chronic obstructive pulmonary disease) (HCC)   HLD (hyperlipidemia)   DVT (deep venous thrombosis) (HCC)   Current smoker   Depression with anxiety   PAD (peripheral artery disease) (HCC)   Sepsis due to osteomyelitis of right calcaneus and R diabetic ulcer present on admission  L foot stage 3 diabetic/pressure ulcer present on admission Patient meets criteria for sepsis with WBC 14.8 and, heart rate  99, lactic acid normal 1.3. Patient completed post 48 hours amputation antibiotics which have been discontinued PRN Zofran for nausea tylenol, Percocet for pain Blood cultures x 2 wound care consult BKA vascular surgeon done on 11/07/2022 PT OT on board   Acute metabolic encephalopathy secondary to hypoglycemia Hypoglycemia improved Rapid response was called on patient on 11/09/2022 after patient was found to be having altered mental status with some jerky posturing, patient was later found to have glucose of 75 which later down trended to 24 and after receiving dextrose patient suddenly became more awake Neurologist has seen patient and does not recommend any further workup for stroke or seizure as this is explained by hypoglycemia Keppra therefore discontinued Continue hypoglycemia protocol   Type 2 diabetes mellitus with diabetic neuropathy, unspecified (HCC):  SSI Glargine dose adjusted   Hypokalemia Replace as needed Monitor BMP   Essential hypertension IV hydralazine as needed held amlodipine and metoprolol since patient at risk of developing hypotension but BP has improved, restarted metoprolol  Continue home clonidine   Chronic diastolic CHF (congestive heart failure) (HCC):  2D echo on 06/07/2021 showed EF of 55 to 60% with grade 1 diastolic dysfunction.  Patient does not have leg edema JVD.  CHF is compensated.  BNP normal 72.6 Watch volume status closely   COPD (chronic obstructive pulmonary disease) (HCC): Stable Bronchodilators   HLD (hyperlipidemia) Crestor   History of DVT (deep venous thrombosis) (HCC) in March 2022 Used to be on elquis but according to patient he is no longer on eliquis. Records show repeat  ultrasound in Dec 2022 showed resolution of DVT   Current smoker Nicotine patch   Depression with anxiety Continue home medications   PAD (peripheral artery disease) (HCC) s/p  RLE angiogram 11/05/2022 Statin, Plavix      DVT prophylaxis:  Heparin     Current Admission Status: inpatient     TOC needs / Dispo plan: Skilled nursing facility/rehab   Barriers to discharge / significant pending items: After clearance by vascular surgeon        Physical Exam: Constitutional:      General: He is not in acute distress.    Appearance: Normal appearance.  Cardiovascular:     Rate and Rhythm: Normal rate and regular rhythm.  Pulmonary:     Effort: Pulmonary effort is normal.     Breath sounds: Normal breath sounds.  Abdominal:     General: Abdomen is flat.     Palpations: Abdomen is soft.  Musculoskeletal: Right below-knee amputation stump noted  Neurological:     General: No focal deficit present.     Mental Status: He is alert. Mental status is at baseline.  Psychiatric:        Mood and Affect: Mood normal.        Behavior: Behavior normal.      Data Reviewed:   Reviewed patient's most recent laboratory results showing sodium 132 potassium 4.4 creatinine 0.99 WBC 8.4 hemoglobin 10   time spent: 41 minutes     Vitals:   11/11/22 2159 11/11/22 2159 11/11/22 2254 11/12/22 0723  BP: 110/75 110/75 118/73 (!) 124/93  Pulse:  71 70 69  Resp:   18 15  Temp:   99.2 F (37.3 C) 98.8 F (37.1 C)  TempSrc:      SpO2:   93% 98%  Weight:      Height:         Author: Loyce Dys, MD 11/12/2022 4:15 PM  For on call review [email or web address].

## 2022-11-12 NOTE — Progress Notes (Signed)
RN was called to the room due to pt being found next to the bed on the floor.  Upon arrival, pt was sitting on edge of bed having his vital signs checked.  Pt denies injury and states he was trying to reach for an item on the counter across a gap from the bed.  Dr. Meriam Sprague notified and pt denies family to notify.  No signs of injury noted to pt's person.

## 2022-11-12 NOTE — Progress Notes (Signed)
Occupational Therapy Treatment Patient Details Name: Timothy [name]. MRN: [id number] DOB: Oct 25, 1960 Today's Date: 11/12/2022   History of present illness Pt admitted for osteomyelitis of R foot and is s/p R BKA on 11/07/22.  Of note rapid response/code stroke called on 11/09/22 with pt ultimately found to by hypoglycemic with BG 24, no further testing per neuro.  PMH includes HTN, HLD, DM, COPD on 2-3L of O2, CHF, depression, PTSD, and bipolar.   OT comments  Timothy [name] was seen for OT treatment on this date. Upon arrival to room pt long sitting in bed with RN at bedside. Pt initially refusing therapy and appears agitated with education/mobility attempts. Pt then requests bed change and self-initiates removing fitted sheet from bed level. Currently MOD I don/doff gown seated EOB. Impulsivity noted, pt stands at EOB with RN and OT standing across room and pt yelling out in pain stating "stop touching my leg, get that off my leg" - pt maintaining R knee in flexion in standing causing R stump contact with bed, pain improved with R knee relaxed in standing. Educated on desensitization techniques for ongoing pain mgmt. Pt making progress toward goals, will continue to follow POC. Discharge recommendation remains appropriate.     Recommendations for follow up therapy are one component of a multi-disciplinary discharge planning process, led by the attending physician.  Recommendations may be updated based on patient status, additional functional criteria and insurance authorization.    Assistance Recommended at Discharge Intermittent Supervision/Assistance  Patient can return home with the following  A little help with walking and/or transfers;A little help with bathing/dressing/bathroom;Assistance with cooking/housework;Direct supervision/assist for medications management;Assist for transportation;Help with stairs or ramp for entrance   Equipment Recommendations  Other (comment) (2WW)     Recommendations for Other Services      Precautions / Restrictions Precautions Precautions: Fall Restrictions Weight Bearing Restrictions: No RLE Weight Bearing: Non weight bearing       Mobility Bed Mobility Overal bed mobility: Modified Independent                  Transfers Overall transfer level: Needs assistance Equipment used: Rolling walker (2 wheels) Transfers: Sit to/from Stand Sit to Stand: Min guard           General transfer comment: poor safety awareness     Balance Overall balance assessment: History of Falls, Needs assistance Sitting-balance support: Feet supported, Bilateral upper extremity supported Sitting balance-Leahy Scale: Good     Standing balance support: Bilateral upper extremity supported Standing balance-Leahy Scale: Fair                             ADL either performed or assessed with clinical judgement   ADL Overall ADL's : Needs assistance/impaired                                       General ADL Comments: MOD I don/doff gown seated EOB. MOD A bed making task, pt removes bed sheets long sitting in bed, assist to place new sheets as pt stands.      Cognition Arousal/Alertness: Awake/alert Behavior During Therapy: Agitated, Flat affect, Impulsive Overall Cognitive Status: No family/caregiver present to determine baseline cognitive functioning  Exercises Other Exercises Other Exercises: Pt educated on desensitization techniques            Pertinent Vitals/ Pain       Pain Assessment Pain Assessment: 0-10 Pain Score: 8  Pain Location: RLE Pain Descriptors / Indicators: Aching, Sore Pain Intervention(s): Limited activity within patient's tolerance, Premedicated before session, Patient requesting pain meds-RN notified, RN gave pain meds during session   Frequency  Min 2X/week        Progress Toward Goals  OT  Goals(current goals can now be found in the care plan section)  Progress towards OT goals: Progressing toward goals  Acute Rehab OT Goals Patient Stated Goal: go to sleep OT Goal Formulation: With patient Time For Goal Achievement: 11/22/22 Potential to Achieve Goals: Good ADL Goals Pt Will Perform Grooming: with modified independence;sitting Pt Will Perform Lower Body Dressing: with modified independence;sitting/lateral leans;bed level;sit to/from stand Pt Will Transfer to Toilet: with modified independence;bedside commode Pt Will Perform Toileting - Clothing Manipulation and hygiene: with modified independence;sit to/from stand;sitting/lateral leans Pt Will Perform Tub/Shower Transfer: Tub transfer;tub bench  Plan Discharge plan remains appropriate;Frequency needs to be updated    Co-evaluation                 AM-PAC OT "6 Clicks" Daily Activity     Outcome Measure   Help from another person eating meals?: None Help from another person taking care of personal grooming?: A Little Help from another person toileting, which includes using toliet, bedpan, or urinal?: A Lot Help from another person bathing (including washing, rinsing, drying)?: A Little Help from another person to put on and taking off regular upper body clothing?: None Help from another person to put on and taking off regular lower body clothing?: A Lot 6 Click Score: 18    End of Session Equipment Utilized During Treatment: Rolling walker (2 wheels)  OT Visit Diagnosis: Repeated falls (R29.6);Other abnormalities of gait and mobility (R26.89)   Activity Tolerance Patient tolerated treatment well   Patient Left in bed;with call bell/phone within reach;with bed alarm set;with nursing/sitter in room   Nurse Communication          Time: 1610-9604 OT Time Calculation (min): 14 min  Charges: OT General Charges $OT Visit: 1 Visit OT Treatments $Self Care/Home Management : 8-22 mins  Kathie Dike, M.S.  OTR/L  11/12/22, 2:33 PM  ascom [phone]

## 2022-11-12 NOTE — Progress Notes (Signed)
Physical Therapy Treatment Patient Details Name: Timothy [name]. MRN: [id number] DOB: 1960-11-30 Today's Date: 11/12/2022   History of Present Illness Pt admitted for osteomyelitis of R foot and is s/p R BKA on 11/07/22.  Of note rapid response/code stroke called on 11/09/22 with pt ultimately found to by hypoglycemic with BG 24, no further testing per neuro.  PMH includes HTN, HLD, DM, COPD on 2-3L of O2, CHF, depression, PTSD, and bipolar.    PT Comments    Pt was pleasant and motivated to participate during the session and put forth good effort throughout. Pt required extra time and effort with bed mobility tasks and sit to/from stand transfers but no physical assist.  Pt was able to shuffle his LLE at the EOB about one foot with min A to guide the RW.  Pt reported no adverse symptoms during the session other than RLE pain with SpO2 and HR WNL on room air.  Pt will benefit from continued PT services upon discharge to safely address deficits listed in patient problem list for decreased caregiver assistance and eventual return to PLOF.      Recommendations for follow up therapy are one component of a multi-disciplinary discharge planning process, led by the attending physician.  Recommendations may be updated based on patient status, additional functional criteria and insurance authorization.  Follow Up Recommendations  Can patient physically be transported by private vehicle: No    Assistance Recommended at Discharge Intermittent Supervision/Assistance  Patient can return home with the following A lot of help with walking and/or transfers;A lot of help with bathing/dressing/bathroom;Help with stairs or ramp for entrance;Assist for transportation;Assistance with Charity fundraiser (measurements PT);Wheelchair cushion (measurements PT);BSC/3in1    Recommendations for Other Services       Precautions / Restrictions Precautions Precautions:  Fall Restrictions Weight Bearing Restrictions: Yes RLE Weight Bearing: Non weight bearing     Mobility  Bed Mobility Overal bed mobility: Modified Independent             General bed mobility comments: Increased time and effort and use of the bed rail    Transfers Overall transfer level: Needs assistance Equipment used: Rolling walker (2 wheels) Transfers: Sit to/from Stand Sit to Stand: Min guard           General transfer comment: Min verbal and tactile cues for hand placement and increased trunk flexion    Ambulation/Gait Ambulation/Gait assistance: Min assist Gait Distance (Feet): 1 Feet Assistive device: Rolling walker (2 wheels) Gait Pattern/deviations: Shuffle Gait velocity: decreased     General Gait Details: Pt able to shuffle at the EOB x 1 foot with mod multi-modal cues for sequencing and min A to guide the RW   Stairs             Wheelchair Mobility    Modified Rankin (Stroke Patients Only)       Balance Overall balance assessment: History of Falls, Needs assistance Sitting-balance support: Feet supported, Bilateral upper extremity supported Sitting balance-Leahy Scale: Good     Standing balance support: Bilateral upper extremity supported Standing balance-Leahy Scale: Fair                              Cognition Arousal/Alertness: Awake/alert Behavior During Therapy: WFL for tasks assessed/performed Overall Cognitive Status: No family/caregiver present to determine baseline cognitive functioning  Exercises Total Joint Exercises Quad Sets: AROM, Strengthening, Right, 10 reps, 15 reps Long Arc Quad: AROM, Strengthening, Both, 5 reps, 10 reps Knee Flexion: AROM, Strengthening, Both, 5 reps, 10 reps Other Exercises Other Exercises: Pt education/review provided on RLE positioning to promote R knee ext PROM Other Exercises: Pt education/review provided on RLE QS  x 10 every 1-2 hours daily and seated LAQ/knee flex x 10 reps whenever staff has him sitting at the EOB for any reason    General Comments        Pertinent Vitals/Pain Pain Assessment Pain Assessment: 0-10 Pain Score: 6  Pain Location: RLE Pain Descriptors / Indicators: Aching, Sore Pain Intervention(s): Repositioned, Premedicated before session, Monitored during session    Home Living                          Prior Function            PT Goals (current goals can now be found in the care plan section) Progress towards PT goals: Progressing toward goals    Frequency    Min 4X/week      PT Plan Current plan remains appropriate    Co-evaluation              AM-PAC PT "6 Clicks" Mobility   Outcome Measure  Help needed turning from your back to your side while in a flat bed without using bedrails?: A Little Help needed moving from lying on your back to sitting on the side of a flat bed without using bedrails?: A Little Help needed moving to and from a bed to a chair (including a wheelchair)?: A Little Help needed standing up from a chair using your arms (e.g., wheelchair or bedside chair)?: A Little Help needed to walk in hospital room?: Total Help needed climbing 3-5 steps with a railing? : Total 6 Click Score: 14    End of Session Equipment Utilized During Treatment: Gait belt Activity Tolerance: Patient tolerated treatment well Patient left: in bed;with bed alarm set;with call bell/phone within reach;Other (comment) (Pt declined up in chair) Nurse Communication: Mobility status PT Visit Diagnosis: Repeated falls (R29.6);Muscle weakness (generalized) (M62.81);Difficulty in walking, not elsewhere classified (R26.2);Pain Pain - Right/Left: Right Pain - part of body: Leg     Time: 5409-8119 PT Time Calculation (min) (ACUTE ONLY): 28 min  Charges:  $Therapeutic Exercise: 8-22 mins $Therapeutic Activity: 8-22 mins                     D. Scott  Nayquan Evinger PT, DPT 11/12/22, 12:17 PM

## 2022-11-12 NOTE — Progress Notes (Addendum)
    Subjective  - POD #5, status post right BKA  Complaining of mild stump pain   Physical Exam:  Dressing changed today.  The incision is healing nicely       Assessment/Plan:  POD #5  Stable from a vascular perspective. Disposition pending  Wells Inetha Maret 11/12/2022 6:05 PM --  Vitals:   11/12/22 0723 11/12/22 1630  BP: (!) 124/93 104/66  Pulse: 69 72  Resp: 15 17  Temp: 98.8 F (37.1 C) 98.6 F (37 C)  SpO2: 98% 94%    Intake/Output Summary (Last 24 hours) at 11/12/2022 1805 Last data filed at 11/12/2022 1300 Gross per 24 hour  Intake 120 ml  Output 1400 ml  Net -1280 ml     Laboratory CBC    Component Value Date/Time   WBC 8.4 11/12/2022 0430   HGB 10.1 (L) 11/12/2022 0430   HGB 14.6 06/22/2020 1058   HCT 32.2 (L) 11/12/2022 0430   HCT 41.8 06/22/2020 1058   PLT 659 (H) 11/12/2022 0430   PLT 301 06/22/2020 1058    BMET    Component Value Date/Time   NA 132 (L) 11/12/2022 0430   NA 134 06/22/2020 1058   K 4.4 11/12/2022 0430   CL 97 (L) 11/12/2022 0430   CO2 27 11/12/2022 0430   GLUCOSE 163 (H) 11/12/2022 0430   BUN 15 11/12/2022 0430   BUN 8 06/22/2020 1058   CREATININE 0.99 11/12/2022 0430   CALCIUM 8.4 (L) 11/12/2022 0430   GFRNONAA >60 11/12/2022 0430   GFRAA 107 06/22/2020 1058    COAG Lab Results  Component Value Date   INR 1.3 (H) 11/02/2022   INR 1.2 11/02/2022   INR 1.2 08/14/2020   No results found for: "PTT"  Antibiotics Anti-infectives (From admission, onward)    Start     Dose/Rate Route Frequency Ordered Stop   11/03/22 0600  vancomycin (VANCOCIN) IVPB 1000 mg/200 mL premix        1,000 mg 200 mL/hr over 60 Minutes Intravenous Every 12 hours 11/02/22 1316 11/09/22 2359   11/02/22 1330  vancomycin (VANCOREADY) IVPB 750 mg/150 mL        750 mg 150 mL/hr over 60 Minutes Intravenous  Once 11/02/22 1316 11/02/22 1750   11/02/22 1300  cefTRIAXone (ROCEPHIN) 2 g in sodium chloride 0.9 % 100 mL IVPB        2 g 200  mL/hr over 30 Minutes Intravenous Every 24 hours 11/02/22 1255 11/09/22 1550   11/02/22 1300  metroNIDAZOLE (FLAGYL) IVPB 500 mg        500 mg 100 mL/hr over 60 Minutes Intravenous Every 12 hours 11/02/22 1255 11/09/22 1607   11/02/22 1200  vancomycin (VANCOCIN) IVPB 1000 mg/200 mL premix        1,000 mg 200 mL/hr over 60 Minutes Intravenous  Once 11/02/22 1150 11/02/22 1333        V. Charlena Cross, M.D., [facility] [place] Office: [phone] Pager:  [phone]

## 2022-11-13 DIAGNOSIS — M869 Osteomyelitis, unspecified: Secondary | ICD-10-CM | POA: Diagnosis not present

## 2022-11-13 LAB — BASIC METABOLIC PANEL
Anion gap: 10 (ref 5–15)
BUN: 19 mg/dL (ref 6–20)
CO2: 27 mmol/L (ref 22–32)
Calcium: 8.7 mg/dL — ABNORMAL LOW (ref 8.9–10.3)
Chloride: 94 mmol/L — ABNORMAL LOW (ref 98–111)
Creatinine, Ser: 0.94 mg/dL (ref 0.61–1.24)
GFR, Estimated: >60 mL/min (ref >60)
Glucose, Bld: 183 mg/dL — ABNORMAL HIGH (ref 70–99)
Potassium: 4.3 mmol/L (ref 3.5–5.1)
Sodium: 131 mmol/L — ABNORMAL LOW (ref 135–145)

## 2022-11-13 LAB — GLUCOSE, CAPILLARY
Glucose-Capillary: 175 mg/dL — ABNORMAL HIGH (ref 70–99)
Glucose-Capillary: 200 mg/dL — ABNORMAL HIGH (ref 70–99)
Glucose-Capillary: 163 mg/dL — ABNORMAL HIGH (ref 70–99)
Glucose-Capillary: 179 mg/dL — ABNORMAL HIGH (ref 70–99)

## 2022-11-13 LAB — CBC WITH DIFFERENTIAL/PLATELET
Abs Immature Granulocytes: 0.18 10*3/uL — ABNORMAL HIGH (ref 0.00–0.07)
Basophils Absolute: 0.1 10*3/uL (ref 0.0–0.1)
Basophils Relative: 1 %
Eosinophils Absolute: 0.2 10*3/uL (ref 0.0–0.5)
Eosinophils Relative: 3 %
HCT: 32.3 % — ABNORMAL LOW (ref 39.0–52.0)
Hemoglobin: 10 g/dL — ABNORMAL LOW (ref 13.0–17.0)
Immature Granulocytes: 2 %
Lymphocytes Relative: 25 %
Lymphs Abs: 2 10*3/uL (ref 0.7–4.0)
MCH: 27.8 pg (ref 26.0–34.0)
MCHC: 31 g/dL (ref 30.0–36.0)
MCV: 89.7 fL (ref 80.0–100.0)
Monocytes Absolute: 0.8 10*3/uL (ref 0.1–1.0)
Monocytes Relative: 9 %
Neutro Abs: 4.8 10*3/uL (ref 1.7–7.7)
Neutrophils Relative %: 60 %
Platelets: 608 10*3/uL — ABNORMAL HIGH (ref 150–400)
RBC: 3.6 MIL/uL — ABNORMAL LOW (ref 4.22–5.81)
RDW: 13.4 % (ref 11.5–15.5)
WBC: 8 10*3/uL (ref 4.0–10.5)
nRBC: 0 % (ref 0.0–0.2)

## 2022-11-13 LAB — SURGICAL PATHOLOGY

## 2022-11-13 NOTE — Progress Notes (Signed)
   11/13/22 1400  Spiritual Encounters  Type of Visit Initial  Care provided to: Patient  Referral source Chaplain assessment (Chaplain making rounds)  Reason for visit Routine spiritual support  OnCall Visit No  Interventions  Spiritual Care Interventions Made Compassionate presence;Reflective listening;Encouragement  Intervention Outcomes  Outcomes Awareness of support;Awareness of health  Spiritual Care Plan  Spiritual Care Issues Still Outstanding No further spiritual care needs at this time (see row info)   Chaplain available for follow up as needed.

## 2022-11-13 NOTE — TOC Progression Note (Signed)
Transition of Care (TOC) - Progression Note    Patient Details  Name: Timothy [name]. MRN: [id number] Date of Birth: 1960/08/02  Transition of [facility]) CM/SW Contact  Marlowe Sax, RN Phone Number: 11/13/2022, 2:31 PM  Clinical Narrative:   Spoke with the patient, I explained to him to go to Rehab he would need to agree to stay for 30 days but also sign over his income check for a month, he stated that she does not have any income.  I explained that he would not be able to go to Rehab without an income due to medicaid not covering it at 100%, he stated understanding, I asked him if he would be returning home, he stated that he has not home, I asked him where he was prior to this admission, he stated that he was staying with his friend Alinda Money but he tell hjim that he can not return, I asked him if he had been provided with an eviction, he stated that he had not.  He stated that he has no friends and no family to stay with that he would be homeless. I encouraged him to speak with any friends or family that he has to try to arrange DC plan,  He stated that he does not have any friends or family that he could stay with, he walked in a step hop pattern a total of 6 feet today with PT. Possibly will be on the DTP list     Expected Discharge Plan:  (TBD) Barriers to Discharge: Continued Medical Work up  Expected Discharge Plan and Services     Post Acute Care Choice:  (TBD) Living arrangements for the past 2 months: Single Family Home                                       Social Determinants of Health (SDOH) Interventions SDOH Screenings   Food Insecurity: No Food Insecurity (11/02/2022)  Housing: Low Risk  (11/02/2022)  Transportation Needs: No Transportation Needs (11/02/2022)  Utilities: Not At Risk (11/02/2022)  Alcohol Screen: Low Risk  (02/10/2020)  Depression (PHQ2-9): High Risk (10/03/2022)  Financial Resource Strain: Medium Risk (05/28/2022)  Physical Activity:  Inactive (05/28/2022)  Social Connections: Socially Isolated (05/28/2022)  Stress: Stress Concern Present (05/28/2022)  Tobacco Use: High Risk (11/08/2022)    Readmission Risk Interventions    11/06/2022   12:06 PM  Readmission Risk Prevention Plan  Transportation Screening Complete  PCP or Specialist Appt within 3-5 Days Complete  Social Work Consult for Recovery Care Planning/Counseling Complete  Palliative Care Screening Not Applicable  Medication Review Oceanographer) Referral to Pharmacy

## 2022-11-13 NOTE — Progress Notes (Signed)
Physical Therapy Treatment Patient Details Name: Timothy [name]. MRN: [id number] DOB: 22-Feb-1961 Today's Date: 11/13/2022   History of Present Illness Menno Zenteno. 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  Of note rapid response/code stroke called on 11/09/22 with pt ultimately found to by hypoglycemic with BG 24, no further testing per neuro.  PMH includes HTN, HLD, DM, COPD on 2-3L of O2, CHF, depression, PTSD, and bipolar.    PT Comments    Pt awake and agreeable to session, very calm and conversational when engaged. Pt denies any sustain injury from floor episode previous day. Pt requires elevated EOB and can perform supervision level STS transfers x3 with RW for support. Pt started hop-step gait training today, has difficulty with balance on first attempt, particularly with retro stepping, but much improved upon second attempt. Both hop sessions are exerting and fatiguing. Pt educated on quadsets, glute sets, RLE marching ROM, reinforced precautions of limb. Will continue to follow.   Recommendations for follow up therapy are one component of a multi-disciplinary discharge planning process, led by the attending physician.  Recommendations may be updated based on patient status, additional functional criteria and insurance authorization.  Follow Up Recommendations  Can patient physically be transported by private vehicle: No    Assistance Recommended at Discharge Intermittent Supervision/Assistance  Patient can return home with the following A lot of help with walking and/or transfers;A lot of help with bathing/dressing/bathroom;Help with stairs or ramp for entrance;Assist for transportation;Assistance with Charity fundraiser (measurements PT);Wheelchair cushion (measurements PT);BSC/3in1    Recommendations for Other Services       Precautions / Restrictions Precautions Precautions:  Fall Restrictions Weight Bearing Restrictions: Yes RLE Weight Bearing: Non weight bearing     Mobility  Bed Mobility Overal bed mobility: Modified Independent             General bed mobility comments: Increased time and effort and use of the bed rail    Transfers Overall transfer level: Needs assistance Equipment used: Rolling walker (2 wheels) Transfers: Sit to/from Stand Sit to Stand: Min guard, From elevated surface           General transfer comment: STS from EOB x3, then 2 more times during gait training.    Ambulation/Gait Ambulation/Gait assistance: Min assist, Min guard Gait Distance (Feet): 6 Feet Assistive device: Rolling walker (2 wheels)         General Gait Details: EOB to window 25ft forward hop step then backward hop step c RW (performed twice with recovery between)   Stairs             Wheelchair Mobility    Modified Rankin (Stroke Patients Only)       Balance                                            Cognition Arousal/Alertness: Awake/alert Behavior During Therapy: Flat affect Overall Cognitive Status: No family/caregiver present to determine baseline cognitive functioning                                          Exercises Amputee Exercises Quad Sets: AROM, Right, 5 reps, Supine Gluteal Sets: AROM, Right, 5 reps, Supine  Hip Flexion/Marching: AROM, Right, 5 reps, Supine    General Comments        Pertinent Vitals/Pain Pain Assessment Pain Assessment: 0-10 Pain Score: 8  Pain Location: RLE Pain Descriptors / Indicators: Aching, Sore Pain Intervention(s): Limited activity within patient's tolerance, Monitored during session, Premedicated before session, Repositioned    Home Living                          Prior Function            PT Goals (current goals can now be found in the care plan section) Acute Rehab PT Goals Patient Stated Goal: to go home PT Goal  Formulation: With patient Time For Goal Achievement: 11/22/22 Potential to Achieve Goals: Fair Progress towards PT goals: Progressing toward goals    Frequency    Min 4X/week      PT Plan Current plan remains appropriate    Co-evaluation              AM-PAC PT "6 Clicks" Mobility   Outcome Measure  Help needed turning from your back to your side while in a flat bed without using bedrails?: A Little Help needed moving from lying on your back to sitting on the side of a flat bed without using bedrails?: A Little Help needed moving to and from a bed to a chair (including a wheelchair)?: A Lot Help needed standing up from a chair using your arms (e.g., wheelchair or bedside chair)?: A Lot Help needed to walk in hospital room?: A Lot Help needed climbing 3-5 steps with a railing? : Total 6 Click Score: 13    End of Session Equipment Utilized During Treatment: Gait belt Activity Tolerance: Patient tolerated treatment well;No increased pain Patient left: in bed;with bed alarm set;with call bell/phone within reach Nurse Communication: Mobility status PT Visit Diagnosis: Repeated falls (R29.6);Muscle weakness (generalized) (M62.81);Difficulty in walking, not elsewhere classified (R26.2);Pain Pain - Right/Left: Right Pain - part of body: Leg     Time: 1610-9604 PT Time Calculation (min) (ACUTE ONLY): 20 min  Charges:  $Therapeutic Exercise: 8-22 mins           12:34 PM, 11/13/22 Rosamaria Lints, PT, DPT Physical Therapist - [facility]  [phone] (ASCOM)     Ladoris Lythgoe C 11/13/2022, 12:31 PM

## 2022-11-13 NOTE — Progress Notes (Signed)
Progress Note   Patient: Timothy [name]. ZOX:[id number] DOB: 1961/02/21 DOA: 11/02/2022     11 DOS: the patient was seen and examined on 11/13/2022     Subjective:    Patient seen and examined at bedside this morning Denies any overnight events Denies nausea vomiting chest pain abdominal pain or cough Continues to await placement, wound redressed by orthopedic surgeon  Brief Narrative / Hospital Course:  Kengo Boyajian. is a 62 y.o. male with medical history significant of hypertension, hyperlipidemia, diabetes mellitus, COPD on 2-3 L oxygen (2 L in the daytime, and 3L in night), diastolic CHF, depression with anxiety, PTSD, bipolar, DVT on Eliquis, who presents from podiatry office with bilateral foot ulcer and pain, unresponsive to outpatient abx.  Patient has a large and deep ulcer in right heel  and a small ulcer in the left plantar area.  05/17: MRI (+) osteomyelitis R calcaneus. Podiatry recs for BKA. Vasc surgery planning for RLE angio on Monday.  05/18-05/19 (weekend): stable 05/20: RLE angiography w/ angioplasty today w/ Dr Wyn Quaker 05/21: awaiting amputation tomorrow  5/22: Patient had amputation   Consultants:  Podiatry Vascular Surgery    Procedures: 11/05/2022 RLE angiography w/ angioplasty 11/07/2022-right BKA done           ASSESSMENT & PLAN:   Principal Problem:   Osteomyelitis of right foot (HCC) Active Problems:   Sepsis (HCC)   Type 2 diabetes mellitus with diabetic neuropathy, unspecified (HCC)   Essential hypertension   Chronic diastolic CHF (congestive heart failure) (HCC)   COPD (chronic obstructive pulmonary disease) (HCC)   HLD (hyperlipidemia)   DVT (deep venous thrombosis) (HCC)   Current smoker   Depression with anxiety   PAD (peripheral artery disease) (HCC)   Sepsis due to osteomyelitis of right calcaneus and R diabetic ulcer present on admission  L foot stage 3 diabetic/pressure ulcer present on admission Patient meets criteria for  sepsis with WBC 14.8 and, heart rate 99, lactic acid normal 1.3. Patient completed post 48 hours amputation antibiotics which have been discontinued According to vascular surgeon he agrees there is no need for IV antibiotics PRN Zofran for nausea tylenol, Percocet for pain Blood cultures x 2 shows no growth BKA vascular surgeon done on 11/07/2022 PT OT on board   Acute metabolic encephalopathy secondary to hypoglycemia Hypoglycemia improved Rapid response was called on patient on 11/09/2022 after patient was found to be having altered mental status with some jerky posturing, patient was later found to have glucose of 75 which later down trended to 24 and after receiving dextrose patient suddenly became more awake Neurologist has seen patient and does not recommend any further workup for stroke or seizure as this is explained by hypoglycemia Keppra therefore discontinued Continue hypoglycemia protocol   Type 2 diabetes mellitus with diabetic neuropathy, unspecified (HCC):  SSI Glargine dose adjusted   Hypokalemia-improved Replace as needed Monitor BMP   Essential hypertension IV hydralazine as needed held amlodipine and metoprolol since patient at risk of developing hypotension but BP has improved, restarted metoprolol  Continue home clonidine   Chronic diastolic CHF (congestive heart failure) (HCC):  2D echo on 06/07/2021 showed EF of 55 to 60% with grade 1 diastolic dysfunction.  Patient does not have leg edema JVD.  CHF is compensated.  BNP normal 72.6 Watch volume status closely   COPD (chronic obstructive pulmonary disease) (HCC): Stable Bronchodilators   HLD (hyperlipidemia) Crestor   History of DVT (deep venous thrombosis) (HCC) in March 2022  Used to be on elquis but according to patient he is no longer on eliquis. Records show repeat ultrasound in Dec 2022 showed resolution of DVT   Current smoker Nicotine patch   Depression with anxiety Continue home medications    PAD (peripheral artery disease) (HCC) s/p  RLE angiogram 11/05/2022 Statin, Plavix      DVT prophylaxis: Heparin     Current Admission Status: inpatient     TOC needs / Dispo plan: Skilled nursing facility/rehab   Barriers to discharge / significant pending items: Pending placement    Physical Exam: Constitutional:      General: He is not in acute distress.    Appearance: Normal appearance.  Cardiovascular:     Rate and Rhythm: Normal rate and regular rhythm.  Pulmonary:     Effort: Pulmonary effort is normal.     Breath sounds: Normal breath sounds.  Abdominal:     General: Abdomen is flat.     Palpations: Abdomen is soft.  Musculoskeletal: Right below-knee amputation stump dressing is clean and dry  Neurological:     General: No focal deficit present.     Mental Status: He is alert. Mental status is at baseline.  Psychiatric:        Mood and Affect: Mood normal.        Behavior: Behavior normal.      Data Reviewed:   Reviewed patient's most recent laboratory results showing sodium 131 potassium 4.3 creatinine 0.94 WBC 8.0 hemoglobin 10.0  time spent: 44 minutes       Vitals:   11/12/22 1908 11/12/22 2226 11/13/22 0019 11/13/22 0734  BP: 97/66 124/80 115/76 118/80  Pulse: 75 73 71 77  Resp: 18 18 20 19   Temp:  98.4 F (36.9 C) 98.8 F (37.1 C) 97.7 F (36.5 C)  TempSrc:  Oral  Oral  SpO2: 100% 99% 98% 99%  Weight:      Height:         Author: Loyce Dys, MD 11/13/2022 1:04 PM  For on call review [email or web address].

## 2022-11-14 DIAGNOSIS — J449 Chronic obstructive pulmonary disease, unspecified: Secondary | ICD-10-CM | POA: Diagnosis not present

## 2022-11-14 DIAGNOSIS — I5032 Chronic diastolic (congestive) heart failure: Secondary | ICD-10-CM | POA: Diagnosis not present

## 2022-11-14 DIAGNOSIS — E1140 Type 2 diabetes mellitus with diabetic neuropathy, unspecified: Secondary | ICD-10-CM | POA: Diagnosis not present

## 2022-11-14 DIAGNOSIS — Z794 Long term (current) use of insulin: Secondary | ICD-10-CM | POA: Diagnosis not present

## 2022-11-14 LAB — GLUCOSE, CAPILLARY
Glucose-Capillary: 178 mg/dL — ABNORMAL HIGH (ref 70–99)
Glucose-Capillary: 149 mg/dL — ABNORMAL HIGH (ref 70–99)
Glucose-Capillary: 172 mg/dL — ABNORMAL HIGH (ref 70–99)
Glucose-Capillary: 129 mg/dL — ABNORMAL HIGH (ref 70–99)

## 2022-11-14 LAB — BASIC METABOLIC PANEL
Anion gap: 11 (ref 5–15)
BUN: 18 mg/dL (ref 6–20)
CO2: 27 mmol/L (ref 22–32)
Calcium: 9 mg/dL (ref 8.9–10.3)
Chloride: 96 mmol/L — ABNORMAL LOW (ref 98–111)
Creatinine, Ser: 0.95 mg/dL (ref 0.61–1.24)
GFR, Estimated: >60 mL/min (ref >60)
Glucose, Bld: 190 mg/dL — ABNORMAL HIGH (ref 70–99)
Potassium: 4.1 mmol/L (ref 3.5–5.1)
Sodium: 134 mmol/L — ABNORMAL LOW (ref 135–145)

## 2022-11-14 LAB — CBC WITH DIFFERENTIAL/PLATELET
Abs Immature Granulocytes: 0.15 10*3/uL — ABNORMAL HIGH (ref 0.00–0.07)
Basophils Absolute: 0.1 10*3/uL (ref 0.0–0.1)
Basophils Relative: 1 %
Eosinophils Absolute: 0.2 10*3/uL (ref 0.0–0.5)
Eosinophils Relative: 3 %
HCT: 32.4 % — ABNORMAL LOW (ref 39.0–52.0)
Hemoglobin: 10.2 g/dL — ABNORMAL LOW (ref 13.0–17.0)
Immature Granulocytes: 2 %
Lymphocytes Relative: 22 %
Lymphs Abs: 1.7 10*3/uL (ref 0.7–4.0)
MCH: 27.9 pg (ref 26.0–34.0)
MCHC: 31.5 g/dL (ref 30.0–36.0)
MCV: 88.8 fL (ref 80.0–100.0)
Monocytes Absolute: 0.8 10*3/uL (ref 0.1–1.0)
Monocytes Relative: 10 %
Neutro Abs: 4.9 10*3/uL (ref 1.7–7.7)
Neutrophils Relative %: 62 %
Platelets: 614 10*3/uL — ABNORMAL HIGH (ref 150–400)
RBC: 3.65 MIL/uL — ABNORMAL LOW (ref 4.22–5.81)
RDW: 13.4 % (ref 11.5–15.5)
WBC: 7.9 10*3/uL (ref 4.0–10.5)
nRBC: 0 % (ref 0.0–0.2)

## 2022-11-14 MED ORDER — SENNOSIDES-DOCUSATE SODIUM 8.6-50 MG PO TABS
2.0000 | ORAL_TABLET | Freq: Every evening | ORAL | Status: DC | PRN
  Administered 2022-11-17: 2 via ORAL
  Filled 2022-11-14: qty 2

## 2022-11-14 NOTE — TOC Progression Note (Addendum)
Transition of Care (TOC) - Progression Note    Patient Details  Name: Timothy [name]. MRN: [id number] Date of Birth: 06-25-1960  Transition of Care [facility]) CM/SW Contact  Marlowe Sax, RN Phone Number: 11/14/2022, 9:59 AM  Clinical Narrative:   I reached out to CIR to see if they would eval for [facility] rehab   Expected Discharge Plan:  (TBD) Barriers to Discharge: Homeless with medical needs  Expected Discharge Plan and Services   Discharge Planning Services: CM Consult Post Acute Care Choice:  (TBD) Living arrangements for the past 2 months: Single Family Home                                       Social Determinants of Health (SDOH) Interventions SDOH Screenings   Food Insecurity: No Food Insecurity (11/02/2022)  Housing: Low Risk  (11/02/2022)  Transportation Needs: No Transportation Needs (11/02/2022)  Utilities: Not At Risk (11/02/2022)  Alcohol Screen: Low Risk  (02/10/2020)  Depression (PHQ2-9): High Risk (10/03/2022)  Financial Resource Strain: Medium Risk (05/28/2022)  Physical Activity: Inactive (05/28/2022)  Social Connections: Socially Isolated (05/28/2022)  Stress: Stress Concern Present (05/28/2022)  Tobacco Use: High Risk (11/08/2022)    Readmission Risk Interventions    11/06/2022   12:06 PM  Readmission Risk Prevention Plan  Transportation Screening Complete  PCP or Specialist Appt within 3-5 Days Complete  Social Work Consult for Recovery Care Planning/Counseling Complete  Palliative Care Screening Not Applicable  Medication Review Oceanographer) Referral to Pharmacy

## 2022-11-14 NOTE — Plan of Care (Signed)

## 2022-11-14 NOTE — Progress Notes (Signed)
Progress Note   Patient: Timothy [name]. NWG:[id number] DOB: 11/23/1960 DOA: 11/02/2022     12 DOS: the patient was seen and examined on 11/14/2022   Brief hospital course: Timothy [name]. is a 62 y.o. male with medical history significant of hypertension, hyperlipidemia, diabetes mellitus, COPD on 2-3 L oxygen (2 L in the daytime, and 3L in night), diastolic CHF, depression with anxiety, PTSD, bipolar, DVT on Eliquis, who presents from podiatry office with bilateral foot ulcer and pain, unresponsive to outpatient abx.  Patient has a large and deep ulcer in right heel  and a small ulcer in the left plantar area.  05/17: MRI (+) osteomyelitis R calcaneus.  Patient had a right lower extremity angiogram with angioplasty followed by BKA on 5/21.  Antibiotics are completed.  Currently pending for placement.      Principal Problem:   Osteomyelitis of right foot (HCC) Active Problems:   Sepsis (HCC)   Type 2 diabetes mellitus with diabetic neuropathy, unspecified (HCC)   Essential hypertension   Chronic diastolic CHF (congestive heart failure) (HCC)   COPD (chronic obstructive pulmonary disease) (HCC)   HLD (hyperlipidemia)   Current smoker   Depression with anxiety   PAD (peripheral artery disease) (HCC)   Hyponatremia   Pressure injury of skin   History of DVT (deep vein thrombosis)   Encephalopathy acute   Assessment and Plan: Sepsis due to osteomyelitis of right calcaneus and R diabetic ulcer present on admission  L foot stage 3 diabetic/pressure ulcer present on admission PAD (peripheral artery disease) (HCC) Patient meets criteria for sepsis with WBC 14.8 and, heart rate 99, lactic acid normal 1.3. Blood culture negative.  Patient is status post below-knee amputation, antibiotics completed.   Acute metabolic encephalopathy secondary to hypoglycemia Hypoglycemia improved Type 2 diabetes mellitus with diabetic neuropathy, unspecified (HCC): Conditions has improved.    Hypokalemia-improved    Essential hypertension Continue home medicine.   Chronic diastolic CHF (congestive heart failure) (HCC):  2D echo on 06/07/2021 showed EF of 55 to 60% with grade 1 diastolic dysfunction.  Patient does not have leg edema JVD.  CHF is compensated.   Stable, no exacerbation.   COPD (chronic obstructive pulmonary disease) (HCC): Stable Bronchodilators   History of DVT (deep venous thrombosis) (HCC) in March 2022 Used to be on elquis but according to patient he is no longer on eliquis. Records show repeat ultrasound in Dec 2022 showed resolution of DVT   Current smoker Nicotine patch   Depression with anxiety Continue home medications      Subjective:  Patient doing well, no complaint today.  Physical Exam: Vitals:   11/13/22 2125 11/13/22 2126 11/13/22 2348 11/14/22 0805  BP: 109/75 122/77 103/67 125/79  Pulse: 79 76 75 71  Resp:   16 14  Temp:   98.8 F (37.1 C) 98.2 F (36.8 C)  TempSrc:      SpO2:   98% 93%  Weight:      Height:       General exam: Appears calm and comfortable  Respiratory system: Clear to auscultation. Respiratory effort normal. Cardiovascular system: S1 & S2 heard, RRR. No JVD, murmurs, rubs, gallops or clicks. No pedal edema. Gastrointestinal system: Abdomen is nondistended, soft and nontender. No organomegaly or masses felt. Normal bowel sounds heard. Central nervous system: Alert and oriented. No focal neurological deficits. Extremities: Right BKA. Skin: No rashes, lesions or ulcers Psychiatry: Judgement and insight appear normal. Mood & affect appropriate.    Data  Reviewed:  Lab results reviewed.  Family Communication: None  Disposition: Status is: Inpatient Remains inpatient appropriate because: Unsafe discharge, homeless, no discharge option.     Time spent: 35 minutes  Author: Marrion Coy, MD 11/14/2022 12:17 PM  For on call review [email or web address].

## 2022-11-14 NOTE — Progress Notes (Addendum)
Patient takes clonidine and I was taking his BP and he insisted he sits up. So while sitting in bed he was wobbling side to side for a split second and BP ended up being 81/62 (69) (sitting for a minute), I had him lie down and asked if he felt dizzy at all he stated no. I took his blood pressure again while he was lying down and I got 122/77 (91).    On Call, Bishop Limbo, NP told me it was fine to hold the clonidine for tonight.   11/13/22 2145  Provider Notification  Provider Name/Title Bishop Limbo, NP  Date Provider Notified 11/13/22  Time Provider Notified 2045  Method of Notification Page (Secure Chat)  Notification Reason Other (Comment)  Provider response Other (Comment)  Date of Provider Response 11/13/22  Time of Provider Response 2045

## 2022-11-14 NOTE — Discharge Instructions (Addendum)
Dressing Instruction  Remove old dressing Cover staples with Xeroform Guaze Then Cover with guaze Then cover with ABD pads X 2 Then wrap with Kerlix X 2 Then Wrap with 6 inch Ace Bandage Snug to reduce edema.   Dressing changes to be done daily until return to clinic for staple removal in 3 weeks.    Rent/Utility/Housing  Agency Name: [facility] Address: [address] [place], [place] [address] Phone: [phone] Email: [email or web address] Website: www.[email or web address] Service(s) Offered: Housing services, self-sufficiency, congregate meal  program, weatherization program, Field seismologist program, emergency food assistance,  housing counseling, home ownership program, wheels -towork program.  Agency Name: [facility] Address: [address] [place], [place] [address] Phone: [phone] (8a-4p) [phone] (8p- 10p) Email: [email or web address] Website: [email or web address] Service(s) Offered: A program for homeless and/or needy men that includes one-on-one counseling, life skills training and job rehabilitation.  Agency Name: [facility] Address: [address] [place], [place] [address] Phone: [phone] Website: www.alliedchurches.org Service(s) Offered: Assistance to needy in emergency with utility bills, heating  fuel, and prescriptions. Shelter for homeless 7pm-7am. October 11, 2016 15  Agency Name: [facility] [place] [facility] Address: [address], [place], [place] [address] Phone: [phone] Contact Person: Cathleen Corti Email: [email or web address] Website: [email or web address].[email or web address] Service(s) Offered: Helps individuals with developmental disabilities move  from housing that is more restrictive to homes where they  can achieve greater independence and have more  opportunities.  Agency Name: [facility] Address:  [address], [place], [place] [address] Phone: [phone] Email: [email or web address] Website: www.[email or web address].org Service(s) Offered: Provides affordable housing for low-income families,  elderly, and disabled individuals. Offer a wide range of  programs and services, from financial planning to afterschool and summer programs.  Agency Name: [facility] Address: [address] [place], [place] [address] Phone: [phone] Service(s) Offered: Child support services; child welfare services; food stamps;  Medicaid; work first family assistance; and aid with fuel,  rent, food and medicine.  Agency Name: [facility] [place] [employer]. Address: [facility] [address] [place], [place]  [address] Phone: [phone] Website: www.familyabuseservices.org Service(s) Offered: 24 hour Crisis Line: [phone]; 24 hour Emergency Shelter;  Transitional Housing; Support Groups; Scientist, physiological;  [employer]; Hispanic Outreach: [phone];  Visitation Center: [phone]. October 11, 2016 16  Agency Name: [facility] [place]. Address: [address], [place], [place] [address] Phone: [phone] Service(s) Offered: CAP Services; Home and [employer]; Individual  or Group Supports; Respite Care Non-Institutional Nursing;  Residential Supports; Respite Care and Personal Care  Services; Transportation; Family and Friends Night;  Recreational Activities; Three Nutritious Meals/Snacks;  Consultation with Registered Dietician; Twenty-four hour  Registered Nurse Access; Daily and [employer]; Camp [employer] Leaves; [place] for the [employer] (During Summer Months) Bingo Night (Every  Wednesday Night); Special Populations Dance Night  (Every Tuesday Night); Professional Hair Care Services.  Agency Name: [facility] Address: [address], [place], [place] [address] Phone:  [phone] Contact Person: Jabier Mutton Website: [email or web address].Physicist, medical) Offered: Residential treatment facility for women; food and  clothing, educational & employment development and  transportation to work; Counsellor of financial skills;  parenting and family reunification; emotional and spiritual  support; transitional housing for program graduates.  Agency Name: [facility] Address: [address], [place], [place] [address] Phone: [phone] Email: [email or web address] Website: TaskTown.es Service(s) Offered: Public housing units for elderly, disabled, and low  income  people; housing choice vouchers for income eligible  applicants; shelter plus care vouchers; and HOPWA  voucher program. October 11, 2016 17  Agency Name: [facility] Address: [address] [place], [place] [address] Phone: [phone] Email: habitat1@netzero [email or web address] Website: www.habitatalamance.org Service(s) Offered: Build houses for families in need of decent housing. Each  adult in the family must invest 200 hours of labor on  someone else's house, work with volunteers to build their  own house, attend classes on budgeting, home maintenance, yard care, and attend homeowner association  meetings.  Agency Name: [facility] Address: [address] [place], [place] [address] Phone: [phone] Website: www.rsli.org Service(s) Offered: Intermediate care facilities for mentally retarded,  Supervised Living in group homes for adults with  developmental disabilities, Supervised Living for people  who have dual diagnoses (MRMI), Independent Living,  Supported Living, respite and a variety of CAP services,  pre-vocational services, day supports, and [employer].  Agency Name: [place] [employer] Phone: [phone] Website: www.NCForeclosurePrevention.gov Service(s)  Offered: Zero-interest, deferred loans to homeowners struggling to  pay their mortgage. Call for more information

## 2022-11-14 NOTE — Progress Notes (Signed)
Progress Note    11/14/2022 2:56 PM 7 Days Post-Op  Subjective: Timothy [name], Timothy [name]. 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  Patient is resting comfortably in bed.  Dressing was changed to his right lower extremity.  No drainage, hematoma, seroma noted.  No infection noted.  Very minimal drainage to the dressing.  Stump was redressed.  Patient denies any pains in his right lower extremity.  Patient denies any chest pain shortness of breath or dizziness today.  Vitals all remained stable.   Vitals:   11/13/22 2348 11/14/22 0805  BP: 103/67 125/79  Pulse: 75 71  Resp: 16 14  Temp: 98.8 F (37.1 C) 98.2 F (36.8 C)  SpO2: 98% 93%   Physical Exam: Cardiac:  RRR S1 & S2 heard, no ribs clicks or gallops.  Lungs: Normal respiratory effort, all lung fields clear on auscultation. Incisions: Right lower extremity BKA healing appropriately.  Dressing taken down the staples remain intact.  No hematoma seroma or infection to note. Extremities: Left lower extremity is warm to touch, right lower extremity BKA, incision healing appropriately. Abdomen: Positive bowel sounds all 4 quadrants, soft, flat, nontender and nondistended. Neurologic: Alert and oriented x 3, follows all commands appropriately, answers all questions appropriately.  CBC    Component Value Date/Time   WBC 7.9 11/14/2022 0717   RBC 3.65 (L) 11/14/2022 0717   HGB 10.2 (L) 11/14/2022 0717   HGB 14.6 06/22/2020 1058   HCT 32.4 (L) 11/14/2022 0717   HCT 41.8 06/22/2020 1058   PLT 614 (H) 11/14/2022 0717   PLT 301 06/22/2020 1058   MCV 88.8 11/14/2022 0717   MCV 96 06/22/2020 1058   MCH 27.9 11/14/2022 0717   MCHC 31.5 11/14/2022 0717   RDW 13.4 11/14/2022 0717   RDW 13.1 06/22/2020 1058   LYMPHSABS 1.7 11/14/2022 0717   LYMPHSABS 1.2 06/22/2020 1058   MONOABS 0.8 11/14/2022 0717   EOSABS 0.2 11/14/2022 0717   EOSABS 0.1 06/22/2020 1058   BASOSABS 0.1 11/14/2022 0717   BASOSABS 0.0 06/22/2020  1058    BMET    Component Value Date/Time   NA 134 (L) 11/14/2022 0717   NA 134 06/22/2020 1058   K 4.1 11/14/2022 0717   CL 96 (L) 11/14/2022 0717   CO2 27 11/14/2022 0717   GLUCOSE 190 (H) 11/14/2022 0717   BUN 18 11/14/2022 0717   BUN 8 06/22/2020 1058   CREATININE 0.95 11/14/2022 0717   CALCIUM 9.0 11/14/2022 0717   GFRNONAA >60 11/14/2022 0717   GFRAA 107 06/22/2020 1058    INR    Component Value Date/Time   INR 1.3 (H) 11/02/2022 1825     Intake/Output Summary (Last 24 hours) at 11/14/2022 1456 Last data filed at 11/14/2022 1406 Gross per 24 hour  Intake --  Output 500 ml  Net -500 ml     Assessment/Plan:  62 y.o. 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  7 Days Post-Op   Patient is recovering as expected.  Right below the knee amputation stump healing well.  No signs or symptoms of infection to note.  Dressing was changed today at the bedside.  Minimal drainage noted.  Redressed with Ace bandage to help control swelling. Okay to discharge per vascular surgery.  Patient to be discharged on ASA 81 mg daily, Plavix 75 mg daily. Follow-up in clinic 3 weeks for staple removal. Dressing to be changed daily at home after leaving the  hospital.  DVT prophylaxis: ASA 81 mg daily, Plavix 75 mg daily   Marcie [facility] 11/14/2022 2:56 PM

## 2022-11-15 DIAGNOSIS — I5032 Chronic diastolic (congestive) heart failure: Secondary | ICD-10-CM | POA: Diagnosis not present

## 2022-11-15 DIAGNOSIS — J449 Chronic obstructive pulmonary disease, unspecified: Secondary | ICD-10-CM | POA: Diagnosis not present

## 2022-11-15 DIAGNOSIS — I739 Peripheral vascular disease, unspecified: Secondary | ICD-10-CM | POA: Diagnosis not present

## 2022-11-15 LAB — GLUCOSE, CAPILLARY
Glucose-Capillary: 164 mg/dL — ABNORMAL HIGH (ref 70–99)
Glucose-Capillary: 180 mg/dL — ABNORMAL HIGH (ref 70–99)
Glucose-Capillary: 167 mg/dL — ABNORMAL HIGH (ref 70–99)
Glucose-Capillary: 172 mg/dL — ABNORMAL HIGH (ref 70–99)
Glucose-Capillary: 155 mg/dL — ABNORMAL HIGH (ref 70–99)

## 2022-11-15 NOTE — Progress Notes (Signed)
Progress Note   Patient: Timothy [name]. ZOX:[id number] DOB: Oct 06, 1960 DOA: 11/02/2022     13 DOS: the patient was seen and examined on 11/15/2022   Brief hospital course: Timothy [name]. is a 62 y.o. male with medical history significant of hypertension, hyperlipidemia, diabetes mellitus, COPD on 2-3 L oxygen (2 L in the daytime, and 3L in night), diastolic CHF, depression with anxiety, PTSD, bipolar, DVT on Eliquis, who presents from podiatry office with bilateral foot ulcer and pain, unresponsive to outpatient abx.  Patient has a large and deep ulcer in right heel  and a small ulcer in the left plantar area.  05/17: MRI (+) osteomyelitis R calcaneus.  Patient had a right lower extremity angiogram with angioplasty followed by BKA on 5/21.  Antibiotics are completed.  Currently pending for placement.      Principal Problem:   Osteomyelitis of right foot (HCC) Active Problems:   Sepsis (HCC)   Type 2 diabetes mellitus with diabetic neuropathy, unspecified (HCC)   Essential hypertension   Chronic diastolic CHF (congestive heart failure) (HCC)   COPD (chronic obstructive pulmonary disease) (HCC)   HLD (hyperlipidemia)   Current smoker   Depression with anxiety   PAD (peripheral artery disease) (HCC)   Hyponatremia   Pressure injury of skin   History of DVT (deep vein thrombosis)   Encephalopathy acute   Assessment and Plan: Sepsis due to osteomyelitis of right calcaneus and R diabetic ulcer present on admission  L foot stage 3 diabetic/pressure ulcer present on admission PAD (peripheral artery disease) (HCC) Patient meets criteria for sepsis with WBC 14.8 and, heart rate 99, lactic acid normal 1.3. Blood culture negative.  Patient is status post below-knee amputation, antibiotics completed.   Acute metabolic encephalopathy secondary to hypoglycemia Hypoglycemia improved Type 2 diabetes mellitus with diabetic neuropathy, unspecified (HCC): Conditions has improved.    Hypokalemia-improved     Essential hypertension Continue home medicine.   Chronic diastolic CHF (congestive heart failure) (HCC):  2D echo on 06/07/2021 showed EF of 55 to 60% with grade 1 diastolic dysfunction.  Patient does not have leg edema JVD.  CHF is compensated.   Stable, no exacerbation.   COPD (chronic obstructive pulmonary disease) (HCC): Stable Bronchodilators   History of DVT (deep venous thrombosis) (HCC) in March 2022 Used to be on elquis but according to patient he is no longer on eliquis. Records show repeat ultrasound in Dec 2022 showed resolution of DVT   Current smoker Nicotine patch   Depression with anxiety Continue home medications    Condition stable, no new issues, pending placement.    Subjective: Patient does not have any complaint.  Physical Exam: Vitals:   11/14/22 2207 11/14/22 2207 11/14/22 2352 11/15/22 0823  BP: 112/75 112/75 115/72 105/75  Pulse:  75 72 70  Resp:   20 18  Temp:   98.7 F (37.1 C) 98.1 F (36.7 C)  TempSrc:      SpO2:   100% 97%  Weight:      Height:       General exam: Appears calm and comfortable  Respiratory system: Clear to auscultation. Respiratory effort normal. Cardiovascular system: S1 & S2 heard, RRR. No JVD, murmurs, rubs, gallops or clicks. No pedal edema. Gastrointestinal system: Abdomen is nondistended, soft and nontender. No organomegaly or masses felt. Normal bowel sounds heard. Central nervous system: Alert and oriented. No focal neurological deficits. Extremities: Right BKA Skin: No rashes, lesions or ulcers Psychiatry: Judgement and insight appear normal.  Mood & affect appropriate.    Data Reviewed:  There are no new results to review at this time.  Family Communication: None  Disposition: Status is: Inpatient Remains inpatient appropriate because: Unsafe discharge option.     Time spent: 25 minutes  Author: Marrion Coy, MD 11/15/2022 1:00 PM  For on call review  [email or web address].

## 2022-11-15 NOTE — Plan of Care (Signed)
Patient A&Ox4, from home, up with assist in room. Pain partially controlled with medication, repositioning and ice.

## 2022-11-15 NOTE — Plan of Care (Signed)
  Problem: Education: Goal: Ability to describe self-care measures that may prevent or decrease complications (Diabetes Survival Skills Education) will improve Outcome: Progressing Goal: Individualized Educational Video(s) Outcome: Progressing   Problem: Coping: Goal: Ability to adjust to condition or change in health will improve Outcome: Progressing   Problem: Fluid Volume: Goal: Ability to maintain a balanced intake and output will improve Outcome: Progressing   Problem: Health Behavior/Discharge Planning: Goal: Ability to identify and utilize available resources and services will improve Outcome: Progressing Goal: Ability to manage health-related needs will improve Outcome: Progressing   Problem: Metabolic: Goal: Ability to maintain appropriate glucose levels will improve Outcome: Progressing   Problem: Nutritional: Goal: Maintenance of adequate nutrition will improve Outcome: Progressing Goal: Progress toward achieving an optimal weight will improve Outcome: Progressing   Problem: Skin Integrity: Goal: Risk for impaired skin integrity will decrease Outcome: Progressing   Problem: Tissue Perfusion: Goal: Adequacy of tissue perfusion will improve Outcome: Progressing   Problem: Education: Goal: Knowledge of General Education information will improve Description: Including pain rating scale, medication(s)/side effects and non-pharmacologic comfort measures Outcome: Progressing   Problem: Health Behavior/Discharge Planning: Goal: Ability to manage health-related needs will improve Outcome: Progressing   Problem: Clinical Measurements: Goal: Ability to maintain clinical measurements within normal limits will improve Outcome: Progressing Goal: Will remain free from infection Outcome: Progressing Goal: Diagnostic test results will improve Outcome: Progressing Goal: Respiratory complications will improve Outcome: Progressing Goal: Cardiovascular complication will  be avoided Outcome: Progressing   Problem: Activity: Goal: Risk for activity intolerance will decrease Outcome: Progressing   Problem: Nutrition: Goal: Adequate nutrition will be maintained Outcome: Progressing   Problem: Elimination: Goal: Will not experience complications related to bowel motility Outcome: Progressing Goal: Will not experience complications related to urinary retention Outcome: Progressing   Problem: Pain Managment: Goal: General experience of comfort will improve Outcome: Progressing   Problem: Safety: Goal: Ability to remain free from injury will improve Outcome: Progressing   Problem: Skin Integrity: Goal: Risk for impaired skin integrity will decrease Outcome: Progressing   

## 2022-11-15 NOTE — Progress Notes (Signed)
Physical Therapy Treatment Patient Details Name: Timothy [name]. MRN: [id number] DOB: 1961/03/03 Today's Date: 11/15/2022   History of Present Illness Timothy [name]. 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  Of note rapid response/code stroke called on 11/09/22 with pt ultimately found to by hypoglycemic with BG 24, no further testing per neuro.  PMH includes HTN, HLD, DM, COPD on 2-3L of O2, CHF, depression, PTSD, and bipolar.    PT Comments    Pt in bed awake on entry , he is agreeable to PT session. Pt making excellent progress with balance, transfers, tolerable A/ROM of surgical limb. Pt advances AMB to 64ft today but remains limited by exertion and DOE. Pt has imbalance when hopping around, but less than he did 2 days prior, unfortunately is not getting a lot of hop practice. Discussed need to practice self propulsion in [facility], will need to coban wrap IV sites as not to interact with armrests of WC. Will continue to follow.   Recommendations for follow up therapy are one component of a multi-disciplinary discharge planning process, led by the attending physician.  Recommendations may be updated based on patient status, additional functional criteria and insurance authorization.  Follow Up Recommendations  Can patient physically be transported by private vehicle: No    Assistance Recommended at Discharge Intermittent Supervision/Assistance  Patient can return home with the following A lot of help with walking and/or transfers;A lot of help with bathing/dressing/bathroom;Help with stairs or ramp for entrance;Assist for transportation;Assistance with Charity fundraiser (measurements PT);Wheelchair cushion (measurements PT);BSC/3in1;Rolling walker (2 wheels)    Recommendations for Other Services       Precautions / Restrictions Precautions Precautions: Fall Restrictions RLE Weight Bearing: Non weight  bearing     Mobility  Bed Mobility Overal bed mobility: Modified Independent Bed Mobility: Supine to Sit     Supine to sit: Modified independent (Device/Increase time)          Transfers Overall transfer level: Needs assistance Equipment used: Rolling walker (2 wheels) Transfers: Sit to/from Stand Sit to Stand: Supervision, From elevated surface     Squat pivot transfers: Supervision    Lateral/Scoot Transfers: Supervision General transfer comment: see exercies section of note    Ambulation/Gait   Gait Distance (Feet): 18 Feet Assistive device: Rolling walker (2 wheels) Gait Pattern/deviations: Step-to pattern (2-point hop gait) Gait velocity: increased     General Gait Details: still exerting, dyspnea upon return of both intervals, mild dizziness after second interval   Stairs             Wheelchair Mobility    Modified Rankin (Stroke Patients Only)       Balance                                            Cognition Arousal/Alertness: Awake/alert Behavior During Therapy: Flat affect Overall Cognitive Status: Within Functional Limits for tasks assessed                                          Exercises Amputee Exercises Knee Flexion: AROM, Right, 5 reps, Supine Knee Extension: AROM, Right, 5 reps, Supine Other Exercises Other Exercises: Lateral scoot transfers Rt EOB to Left  EOB to Rt EOB (minimal effort required, excellent balance, SpO2 100%, HR 77 bpm) Other Exercises: squat pivot transfer fromRt EOB to guest chair on Right side (without arms) and back to EOB Other Exercises: STS transfer 3x15 seconds, minguard to supervision from elevated surface Other Exercises: AMB 32ft 2x along blue mats 28ft out, 180 degree turnaround and 30ft back. SOB each time, takes 2-3 minute seated recovery interval.    General Comments        Pertinent Vitals/Pain Pain Assessment Pain Assessment: 0-10 Pain Score: 7  Pain  Location: RLE Pain Descriptors / Indicators: Aching, Sore Pain Intervention(s): Limited activity within patient's tolerance, Monitored during session, Premedicated before session    Home Living                          Prior Function            PT Goals (current goals can now be found in the care plan section) Acute Rehab PT Goals Patient Stated Goal: to go home PT Goal Formulation: With patient Time For Goal Achievement: 11/22/22 Potential to Achieve Goals: Poor Progress towards PT goals: Progressing toward goals    Frequency    Min 4X/week      PT Plan Current plan remains appropriate;Equipment recommendations need to be updated;Discharge plan needs to be updated    Co-evaluation              AM-PAC PT "6 Clicks" Mobility   Outcome Measure  Help needed turning from your back to your side while in a flat bed without using bedrails?: A Little Help needed moving from lying on your back to sitting on the side of a flat bed without using bedrails?: A Little Help needed moving to and from a bed to a chair (including a wheelchair)?: A Lot Help needed standing up from a chair using your arms (e.g., wheelchair or bedside chair)?: A Lot Help needed to walk in hospital room?: A Lot Help needed climbing 3-5 steps with a railing? : A Lot 6 Click Score: 14    End of Session Equipment Utilized During Treatment: Gait belt Activity Tolerance: Patient tolerated treatment well;No increased pain;Patient limited by fatigue Patient left: in bed;with call bell/phone within reach;with nursing/sitter in room Nurse Communication: Mobility status PT Visit Diagnosis: Repeated falls (R29.6);Muscle weakness (generalized) (M62.81);Difficulty in walking, not elsewhere classified (R26.2);Pain Pain - Right/Left: Right Pain - part of body: Leg     Time: 1610-9604 PT Time Calculation (min) (ACUTE ONLY): 33 min  Charges:  $Gait Training: 8-22 mins $Therapeutic Activity: 8-22  mins                    10:27 AM, 11/15/22 Rosamaria Lints, PT, DPT Physical Therapist - [facility]  [phone] (ASCOM)    Ilanna Deihl C 11/15/2022, 10:25 AM

## 2022-11-15 NOTE — Progress Notes (Signed)
Visited with Timothy [name] at his bedside. Awake and alert, no acute complaints. Speaking with Dr. Chipper Herb regarding discharge disposition. TOC also working on discharge planning. No acute palliative needs at this time. PMT will follow peripherally and reengage as needed or directed.  No Charge.  Leeanne Deed, DNP, AGNP-C Palliative Medicine  Please call Palliative Medicine team phone with any questions [phone]. For individual providers please see AMION.

## 2022-11-15 NOTE — Progress Notes (Signed)
Occupational Therapy Treatment Patient Details Name: Timothy [name]. MRN: [id number] DOB: 05-10-1961 Today's Date: 11/15/2022   History of present illness Timothy [name]. 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  Of note rapid response/code stroke called on 11/09/22 with pt ultimately found to by hypoglycemic with BG 24, no further testing per neuro.  PMH includes HTN, HLD, DM, COPD on 2-3L of O2, CHF, depression, PTSD, and bipolar.   OT comments  Timothy [name] was seen for OT treatment on this date. Upon arrival to room pt sidelying in bed EOB, agreeable to tx. Pt requires CGA + RW for hopping bed>chair. Left set up for lunch. Educated on desensitization techniques. Pt making good progress toward goals, will continue to follow POC. Discharge recommendation remains appropriate.     Recommendations for follow up therapy are one component of a multi-disciplinary discharge planning process, led by the attending physician.  Recommendations may be updated based on patient status, additional functional criteria and insurance authorization.    Assistance Recommended at Discharge Intermittent Supervision/Assistance  Patient can return home with the following  A little help with walking and/or transfers;A little help with bathing/dressing/bathroom;Assistance with cooking/housework;Direct supervision/assist for medications management;Assist for transportation;Help with stairs or ramp for entrance   Equipment Recommendations  Other (comment) (defer)    Recommendations for Other Services      Precautions / Restrictions Precautions Precautions: Fall Restrictions Weight Bearing Restrictions: Yes RLE Weight Bearing: Non weight bearing       Mobility Bed Mobility Overal bed mobility: Modified Independent                  Transfers Overall transfer level: Needs assistance Equipment used: Rolling Timothy (2 wheels) Transfers: Sit to/from Stand Sit  to Stand: Min guard                 Balance Overall balance assessment: History of Falls, Needs assistance Sitting-balance support: Feet supported, Bilateral upper extremity supported Sitting balance-Leahy Scale: Good     Standing balance support: Bilateral upper extremity supported Standing balance-Leahy Scale: Fair                             ADL either performed or assessed with clinical judgement   ADL Overall ADL's : Needs assistance/impaired                                       General ADL Comments: CGA + RW simulated BSC t/f      Cognition Arousal/Alertness: Awake/alert Behavior During Therapy: Flat affect Overall Cognitive Status: Within Functional Limits for tasks assessed                                                     Pertinent Vitals/ Pain       Pain Assessment Pain Assessment: 0-10 Pain Score: 6  Pain Location: RLE Pain Descriptors / Indicators: Aching, Sore Pain Intervention(s): Limited activity within patient's tolerance, Repositioned, Premedicated before session   Frequency  Min 2X/week        Progress Toward Goals  OT Goals(current goals can now be found in the care plan section)  Progress towards OT  goals: Progressing toward goals  Acute Rehab OT Goals Patient Stated Goal: to go home OT Goal Formulation: With patient Time For Goal Achievement: 11/22/22 Potential to Achieve Goals: Good ADL Goals Pt Will Perform Grooming: with modified independence;sitting Pt Will Perform Lower Body Dressing: with modified independence;sitting/lateral leans;bed level;sit to/from stand Pt Will Transfer to Toilet: with modified independence;bedside commode Pt Will Perform Toileting - Clothing Manipulation and hygiene: with modified independence;sit to/from stand;sitting/lateral leans Pt Will Perform Tub/Shower Transfer: Tub transfer;tub bench  Plan Discharge plan remains appropriate;Frequency needs  to be updated    Co-evaluation                 AM-PAC OT "6 Clicks" Daily Activity     Outcome Measure   Help from another person eating meals?: None Help from another person taking care of personal grooming?: A Little Help from another person toileting, which includes using toliet, bedpan, or urinal?: A Lot Help from another person bathing (including washing, rinsing, drying)?: A Little Help from another person to put on and taking off regular upper body clothing?: None Help from another person to put on and taking off regular lower body clothing?: A Lot 6 Click Score: 18    End of Session Equipment Utilized During Treatment: Rolling Timothy (2 wheels)  OT Visit Diagnosis: Repeated falls (R29.6);Other abnormalities of gait and mobility (R26.89)   Activity Tolerance Patient tolerated treatment well   Patient Left with call bell/phone within reach;in chair;with chair alarm set   Nurse Communication          Time: 1610-9604 OT Time Calculation (min): 10 min  Charges: OT General Charges $OT Visit: 1 Visit OT Treatments $Self Care/Home Management : 8-22 mins  Kathie Dike, M.S. OTR/L  11/15/22, 4:06 PM  ascom [phone]

## 2022-11-16 DIAGNOSIS — Z794 Long term (current) use of insulin: Secondary | ICD-10-CM | POA: Diagnosis not present

## 2022-11-16 DIAGNOSIS — D75838 Other thrombocytosis: Secondary | ICD-10-CM | POA: Insufficient documentation

## 2022-11-16 DIAGNOSIS — I5032 Chronic diastolic (congestive) heart failure: Secondary | ICD-10-CM | POA: Diagnosis not present

## 2022-11-16 DIAGNOSIS — J449 Chronic obstructive pulmonary disease, unspecified: Secondary | ICD-10-CM | POA: Diagnosis not present

## 2022-11-16 DIAGNOSIS — E1140 Type 2 diabetes mellitus with diabetic neuropathy, unspecified: Secondary | ICD-10-CM | POA: Diagnosis not present

## 2022-11-16 LAB — GLUCOSE, CAPILLARY
Glucose-Capillary: 166 mg/dL — ABNORMAL HIGH (ref 70–99)
Glucose-Capillary: 194 mg/dL — ABNORMAL HIGH (ref 70–99)
Glucose-Capillary: 138 mg/dL — ABNORMAL HIGH (ref 70–99)
Glucose-Capillary: 212 mg/dL — ABNORMAL HIGH (ref 70–99)

## 2022-11-16 NOTE — Progress Notes (Signed)
PT Cancellation Note  Patient Details Name: Timothy [name]. MRN: [id number] DOB: August 20, 1960   Cancelled Treatment:    Reason Eval/Treat Not Completed: Fatigue/lethargy limiting ability to participate;Patient declined, no reason specified (Attempted multiple times today. Pt asleep most of day. Caught him awake and eating, agreed to come back. Pt asleep again upon return. He reports not interested in PT today.) When asked if pt is tired, he reports that he is 'irritated.' Will continue to follow, resume services at later date/time.   3:37 PM, 11/16/22 Rosamaria Lints, PT, DPT Physical Therapist - [facility]  [phone] (ASCOM)    Terita Hejl C 11/16/2022, 3:36 PM

## 2022-11-16 NOTE — Plan of Care (Signed)

## 2022-11-16 NOTE — Progress Notes (Signed)
Progress Note   Patient: Timothy [name]. ZOX:[id number] DOB: [birth year] DOA: 11/02/2022     14 DOS: the patient was seen and examined on 11/16/2022   Brief hospital course: Burkley Wondra. is a 62 y.o. male with medical history significant of hypertension, hyperlipidemia, diabetes mellitus, COPD on 2-3 L oxygen (2 L in the daytime, and 3L in night), diastolic CHF, depression with anxiety, PTSD, bipolar, DVT on Eliquis, who presents from podiatry office with bilateral foot ulcer and pain, unresponsive to outpatient abx.  Patient has a large and deep ulcer in right heel  and a small ulcer in the left plantar area.  05/17: MRI (+) osteomyelitis R calcaneus.  Patient had a right lower extremity angiogram with angioplasty followed by BKA on 5/21.  Antibiotics are completed.  Currently pending for placement.      Principal Problem:   Osteomyelitis of right foot (HCC) Active Problems:   Sepsis (HCC)   Type 2 diabetes mellitus with diabetic neuropathy, unspecified (HCC)   Essential hypertension   Chronic diastolic CHF (congestive heart failure) (HCC)   COPD (chronic obstructive pulmonary disease) (HCC)   HLD (hyperlipidemia)   Current smoker   Depression with anxiety   PAD (peripheral artery disease) (HCC)   Hyponatremia   Pressure injury of skin   History of DVT (deep vein thrombosis)   Encephalopathy acute   Reactive thrombocytosis   Assessment and Plan:  Sepsis due to osteomyelitis of right calcaneus and R diabetic ulcer present on admission  L foot stage 3 diabetic/pressure ulcer present on admission PAD (peripheral artery disease) (HCC) Patient meets criteria for sepsis with WBC 14.8 and, heart rate 99, lactic acid normal 1.3. Blood culture negative.  Patient is status post below-knee amputation, antibiotics completed.   Acute metabolic encephalopathy secondary to hypoglycemia Hypoglycemia improved Type 2 diabetes mellitus with diabetic neuropathy, unspecified  (HCC): Conditions has improved.   Hypokalemia-improved     Essential hypertension Continue home medicine.   Chronic diastolic CHF (congestive heart failure) (HCC):  2D echo on 06/07/2021 showed EF of 55 to 60% with grade 1 diastolic dysfunction.  Patient does not have leg edema JVD.  CHF is compensated.   Stable, no exacerbation.   COPD (chronic obstructive pulmonary disease) (HCC): Stable Bronchodilators   History of DVT (deep venous thrombosis) (HCC) in March 2022 Used to be on elquis but according to patient he is no longer on eliquis. Records show repeat ultrasound in Dec 2022 showed resolution of DVT   Current smoker Nicotine patch   Depression with anxiety Continue home medications  Patient has no new issues, no safe discharge option.    Subjective: Patient has no complaint.  Physical Exam: Vitals:   11/15/22 0823 11/15/22 1647 11/15/22 2144 11/16/22 0807  BP: 105/75 122/72 106/76 110/75  Pulse: 70 69 72 71  Resp: 18 16 20 18   Temp: 98.1 F (36.7 C) 98 F (36.7 C) 97.8 F (36.6 C) 98.1 F (36.7 C)  TempSrc:      SpO2: 97% 100% 97% 96%  Weight:      Height:       General exam: Appears calm and comfortable  Respiratory system: Clear to auscultation. Respiratory effort normal. Cardiovascular system: S1 & S2 heard, RRR. No JVD, murmurs, rubs, gallops or clicks. No pedal edema. Gastrointestinal system: Abdomen is nondistended, soft and nontender. No organomegaly or masses felt. Normal bowel sounds heard. Central nervous system: Alert and oriented. No focal neurological deficits. Extremities: Right BKA. Skin: No rashes,  lesions or ulcers Psychiatry: Judgement and insight appear normal. Mood & affect appropriate.    Data Reviewed:  There are no new results to review at this time.  Family Communication: None  Disposition: Status is: Inpatient Remains inpatient appropriate because: No discharge option.     Time spent: 25 minutes  Author: Marrion Coy, MD 11/16/2022 11:42 AM  For on call review [email or web address].

## 2022-11-17 DIAGNOSIS — I5032 Chronic diastolic (congestive) heart failure: Secondary | ICD-10-CM | POA: Diagnosis not present

## 2022-11-17 DIAGNOSIS — E1140 Type 2 diabetes mellitus with diabetic neuropathy, unspecified: Secondary | ICD-10-CM | POA: Diagnosis not present

## 2022-11-17 DIAGNOSIS — J449 Chronic obstructive pulmonary disease, unspecified: Secondary | ICD-10-CM | POA: Diagnosis not present

## 2022-11-17 DIAGNOSIS — Z794 Long term (current) use of insulin: Secondary | ICD-10-CM | POA: Diagnosis not present

## 2022-11-17 LAB — GLUCOSE, CAPILLARY
Glucose-Capillary: 171 mg/dL — ABNORMAL HIGH (ref 70–99)
Glucose-Capillary: 179 mg/dL — ABNORMAL HIGH (ref 70–99)
Glucose-Capillary: 75 mg/dL (ref 70–99)
Glucose-Capillary: 79 mg/dL (ref 70–99)

## 2022-11-17 NOTE — Plan of Care (Signed)

## 2022-11-17 NOTE — Progress Notes (Signed)
Progress Note   Patient: Timothy [name]. ZOX:[id number] DOB: [birth year] DOA: 11/02/2022     15 DOS: the patient was seen and examined on 11/17/2022   Brief hospital course: Timothy [name]. is a 62 y.o. male with medical history significant of hypertension, hyperlipidemia, diabetes mellitus, COPD on 2-3 L oxygen (2 L in the daytime, and 3L in night), diastolic CHF, depression with anxiety, PTSD, bipolar, DVT on Eliquis, who presents from podiatry office with bilateral foot ulcer and pain, unresponsive to outpatient abx.  Patient has a large and deep ulcer in right heel  and a small ulcer in the left plantar area.  05/17: MRI (+) osteomyelitis R calcaneus.  Patient had a right lower extremity angiogram with angioplasty followed by BKA on 5/21.  Antibiotics are completed.  Currently pending for placement.      Principal Problem:   Osteomyelitis of right foot (HCC) Active Problems:   Sepsis (HCC)   Type 2 diabetes mellitus with diabetic neuropathy, unspecified (HCC)   Essential hypertension   Chronic diastolic CHF (congestive heart failure) (HCC)   COPD (chronic obstructive pulmonary disease) (HCC)   HLD (hyperlipidemia)   Current smoker   Depression with anxiety   PAD (peripheral artery disease) (HCC)   Hyponatremia   Pressure injury of skin   History of DVT (deep vein thrombosis)   Encephalopathy acute   Reactive thrombocytosis   Assessment and Plan: Sepsis due to osteomyelitis of right calcaneus and R diabetic ulcer present on admission  L foot stage 3 diabetic/pressure ulcer present on admission PAD (peripheral artery disease) (HCC) Patient meets criteria for sepsis with WBC 14.8 and, heart rate 99, lactic acid normal 1.3. Blood culture negative.  Patient is status post below-knee amputation, antibiotics completed.   Acute metabolic encephalopathy secondary to hypoglycemia Hypoglycemia improved Type 2 diabetes mellitus with diabetic neuropathy, unspecified  (HCC): Conditions has improved.   Hypokalemia-improved     Essential hypertension Continue home medicine.   Chronic diastolic CHF (congestive heart failure) (HCC):  2D echo on 06/07/2021 showed EF of 55 to 60% with grade 1 diastolic dysfunction.  Patient does not have leg edema JVD.  CHF is compensated.   Stable, no exacerbation.   COPD (chronic obstructive pulmonary disease) (HCC): Stable Bronchodilators   History of DVT (deep venous thrombosis) (HCC) in March 2022 Used to be on elquis but according to patient he is no longer on eliquis. Records show repeat ultrasound in Dec 2022 showed resolution of DVT   Current smoker Nicotine patch   Depression with anxiety Continue home medications  No new issues, pending placement.     Subjective: No complaint.  Physical Exam: Vitals:   11/16/22 0807 11/16/22 1605 11/16/22 2146 11/17/22 0821  BP: 110/75 122/82 117/75 102/73  Pulse: 71 75 74 70  Resp: 18 18 (!) 22 18  Temp: 98.1 F (36.7 C) 98.4 F (36.9 C) 99.2 F (37.3 C) 97.9 F (36.6 C)  TempSrc:      SpO2: 96% 97% 98% 100%  Weight:      Height:       General exam: Appears calm and comfortable  Respiratory system: Clear to auscultation. Respiratory effort normal. Cardiovascular system: S1 & S2 heard, RRR. No JVD, murmurs, rubs, gallops or clicks. No pedal edema. Gastrointestinal system: Abdomen is nondistended, soft and nontender. No organomegaly or masses felt. Normal bowel sounds heard. Central nervous system: Alert and oriented. No focal neurological deficits. Extremities: Right BKA Skin: No rashes, lesions or ulcers Psychiatry: Judgement  and insight appear normal. Mood & affect appropriate.    Data Reviewed:  There are no new results to review at this time.  Family Communication: None  Disposition: Status is: Inpatient Remains inpatient appropriate because: Unsafe discharge option.     Time spent: 25 minutes  Author: Marrion Coy, MD 11/17/2022  12:48 PM  For on call review [email or web address].

## 2022-11-18 DIAGNOSIS — E1140 Type 2 diabetes mellitus with diabetic neuropathy, unspecified: Secondary | ICD-10-CM | POA: Diagnosis not present

## 2022-11-18 DIAGNOSIS — Z794 Long term (current) use of insulin: Secondary | ICD-10-CM | POA: Diagnosis not present

## 2022-11-18 DIAGNOSIS — I10 Essential (primary) hypertension: Secondary | ICD-10-CM | POA: Diagnosis not present

## 2022-11-18 DIAGNOSIS — J449 Chronic obstructive pulmonary disease, unspecified: Secondary | ICD-10-CM | POA: Diagnosis not present

## 2022-11-18 LAB — GLUCOSE, CAPILLARY
Glucose-Capillary: 112 mg/dL — ABNORMAL HIGH (ref 70–99)
Glucose-Capillary: 232 mg/dL — ABNORMAL HIGH (ref 70–99)
Glucose-Capillary: 195 mg/dL — ABNORMAL HIGH (ref 70–99)
Glucose-Capillary: 223 mg/dL — ABNORMAL HIGH (ref 70–99)

## 2022-11-18 NOTE — Progress Notes (Signed)
Progress Note   Patient: Timothy [name]. ZOX:[id number] DOB: Jun 12, 1961 DOA: 11/02/2022     16 DOS: the patient was seen and examined on 11/18/2022   Brief hospital course: Timothy [name]. is a 62 y.o. male with medical history significant of hypertension, hyperlipidemia, diabetes mellitus, COPD on 2-3 L oxygen (2 L in the daytime, and 3L in night), diastolic CHF, depression with anxiety, PTSD, bipolar, DVT on Eliquis, who presents from podiatry office with bilateral foot ulcer and pain, unresponsive to outpatient abx.  Patient has a large and deep ulcer in right heel  and a small ulcer in the left plantar area.  05/17: MRI (+) osteomyelitis R calcaneus.  Patient had a right lower extremity angiogram with angioplasty followed by BKA on 5/21.  Antibiotics are completed.  Currently pending for placement.      Principal Problem:   Osteomyelitis of right foot (HCC) Active Problems:   Sepsis (HCC)   Type 2 diabetes mellitus with diabetic neuropathy, unspecified (HCC)   Essential hypertension   Chronic diastolic CHF (congestive heart failure) (HCC)   COPD (chronic obstructive pulmonary disease) (HCC)   HLD (hyperlipidemia)   Current smoker   Depression with anxiety   PAD (peripheral artery disease) (HCC)   Hyponatremia   Pressure injury of skin   History of DVT (deep vein thrombosis)   Encephalopathy acute   Reactive thrombocytosis   Assessment and Plan: Sepsis due to osteomyelitis of right calcaneus and R diabetic ulcer present on admission  L foot stage 3 diabetic/pressure ulcer present on admission PAD (peripheral artery disease) (HCC) Patient meets criteria for sepsis with WBC 14.8 and, heart rate 99, lactic acid normal 1.3. Blood culture negative.  Patient is status post below-knee amputation, antibiotics completed.   Acute metabolic encephalopathy secondary to hypoglycemia Hypoglycemia improved Type 2 diabetes mellitus with diabetic neuropathy, unspecified  (HCC): Conditions has improved.   Hypokalemia-improved     Essential hypertension Continue home medicine.   Chronic diastolic CHF (congestive heart failure) (HCC):  2D echo on 06/07/2021 showed EF of 55 to 60% with grade 1 diastolic dysfunction.  Patient does not have leg edema JVD.  CHF is compensated.   Stable, no exacerbation.   COPD (chronic obstructive pulmonary disease) (HCC): Stable Bronchodilators   History of DVT (deep venous thrombosis) (HCC) in March 2022 Used to be on elquis but according to patient he is no longer on eliquis. Records show repeat ultrasound in Dec 2022 showed resolution of DVT   Current smoker Nicotine patch   Depression with anxiety Continue home medications  Patient is clinically stable, no new issues.  However, patient does not have any discharge option.    Subjective: No complaint.  Physical Exam: Vitals:   11/17/22 1636 11/17/22 2356 11/18/22 0439 11/18/22 0824  BP: 117/76 (!) 87/52 111/77 107/74  Pulse: 70 63 70 74  Resp: 18 16 16 18   Temp: 98.7 F (37.1 C) 98.3 F (36.8 C) 98 F (36.7 C) 98.6 F (37 C)  TempSrc:      SpO2: 99% 99% 99% 100%  Weight:      Height:       General exam: Appears calm and comfortable  Respiratory system: Clear to auscultation. Respiratory effort normal. Cardiovascular system: S1 & S2 heard, RRR. No JVD, murmurs, rubs, gallops or clicks. No pedal edema. Gastrointestinal system: Abdomen is nondistended, soft and nontender. No organomegaly or masses felt. Normal bowel sounds heard. Central nervous system: Alert and oriented. No focal neurological deficits. Extremities:  Right BKA. Skin: No rashes, lesions or ulcers Psychiatry: Judgement and insight appear normal. Mood & affect appropriate.    Data Reviewed:  There are no new results to review at this time.  Family Communication: None  Disposition: Status is: Inpatient Remains inpatient appropriate because: No discharge option.     Time  spent: 25 minutes  Author: Marrion Coy, MD 11/18/2022 12:32 PM  For on call review [email or web address].

## 2022-11-18 NOTE — Plan of Care (Signed)
  Problem: Fluid Volume: Goal: Ability to maintain a balanced intake and output will improve Outcome: Progressing   Problem: Metabolic: Goal: Ability to maintain appropriate glucose levels will improve Outcome: Progressing   Problem: Nutritional: Goal: Maintenance of adequate nutrition will improve Outcome: Progressing Goal: Progress toward achieving an optimal weight will improve Outcome: Progressing   Problem: Clinical Measurements: Goal: Respiratory complications will improve Outcome: Progressing   Problem: Elimination: Goal: Will not experience complications related to bowel motility Outcome: Progressing   Problem: Pain Managment: Goal: General experience of comfort will improve Outcome: Progressing   Problem: Safety: Goal: Ability to remain free from injury will improve Outcome: Progressing   Problem: Skin Integrity: Goal: Risk for impaired skin integrity will decrease Outcome: Progressing

## 2022-11-18 NOTE — Plan of Care (Signed)

## 2022-11-19 DIAGNOSIS — J449 Chronic obstructive pulmonary disease, unspecified: Secondary | ICD-10-CM | POA: Diagnosis not present

## 2022-11-19 DIAGNOSIS — Z794 Long term (current) use of insulin: Secondary | ICD-10-CM | POA: Diagnosis not present

## 2022-11-19 DIAGNOSIS — E1140 Type 2 diabetes mellitus with diabetic neuropathy, unspecified: Secondary | ICD-10-CM | POA: Diagnosis not present

## 2022-11-19 LAB — GLUCOSE, CAPILLARY
Glucose-Capillary: 192 mg/dL — ABNORMAL HIGH (ref 70–99)
Glucose-Capillary: 204 mg/dL — ABNORMAL HIGH (ref 70–99)
Glucose-Capillary: 136 mg/dL — ABNORMAL HIGH (ref 70–99)
Glucose-Capillary: 170 mg/dL — ABNORMAL HIGH (ref 70–99)

## 2022-11-19 NOTE — Plan of Care (Signed)

## 2022-11-19 NOTE — Plan of Care (Signed)
  Problem: Nutritional: Goal: Maintenance of adequate nutrition will improve Outcome: Progressing   Problem: Skin Integrity: Goal: Risk for impaired skin integrity will decrease Outcome: Progressing   Problem: Tissue Perfusion: Goal: Adequacy of tissue perfusion will improve Outcome: Progressing   

## 2022-11-19 NOTE — Progress Notes (Signed)
Progress Note    11/19/2022 3:50 PM 12 Days Post-Op  Subjective:  Timothy Zubair. is a 62 y.o. male with medical history significant of hypertension, hyperlipidemia, diabetes mellitus, COPD on 2-3 L oxygen (2 L in the daytime, and 3L in night), diastolic CHF, depression with anxiety, PTSD, bipolar, DVT on Eliquis, who presents from podiatry office with bilateral foot ulcer and pain, unresponsive to outpatient abx.  Patient has a large and deep ulcer in right heel  and a small ulcer in the left plantar area.  On 11/02/2022 patient underwent MRI which showed osteomyelitis of the right calcaneus.  On 11/05/2022 patient underwent angiogram of the right lower extremity.  It was determined the patient would need a right lower extremity BKA.  On 11/07/2022 patient was taken back to the operating room for right lower extremity below the knee amputation.  On exam today patient is resting comfortably in bed.  Right lower stump is wrapped with Ace bandage.  No complications to note.  Patient is recovering as expected.  No complaints overnight.  Patient is relatively homeless he states and has nowhere to go to be able to be discharged from the hospital.  Vitals all remained stable   Vitals:   11/19/22 0008 11/19/22 0817  BP: (!) 97/55 100/72  Pulse: 72 72  Resp: 17   Temp: 98.3 F (36.8 C) 97.7 F (36.5 C)  SpO2: 92% 95%   Physical Exam: Cardiac:  RRR, No JVD rubs or murmurs.  Lungs: Clear on auscultation bilaterally.  No rhonchi rales or wheezing. Incisions: Right lower extremity BKA.  Staples intact.  No infection to note. Extremities: Right lower extremity BKA clean dry and intact.  No hematoma seroma to note no infection to note.  Left lower extremity positive palpable posttibial pulses. Abdomen: Bowel sounds throughout, soft, flat, nontender, nondistended. Neurologic: Third and oriented x 3 follows all commands answers all questions appropriately.  CBC    Component Value Date/Time   WBC  7.9 11/14/2022 0717   RBC 3.65 (L) 11/14/2022 0717   HGB 10.2 (L) 11/14/2022 0717   HGB 14.6 06/22/2020 1058   HCT 32.4 (L) 11/14/2022 0717   HCT 41.8 06/22/2020 1058   PLT 614 (H) 11/14/2022 0717   PLT 301 06/22/2020 1058   MCV 88.8 11/14/2022 0717   MCV 96 06/22/2020 1058   MCH 27.9 11/14/2022 0717   MCHC 31.5 11/14/2022 0717   RDW 13.4 11/14/2022 0717   RDW 13.1 06/22/2020 1058   LYMPHSABS 1.7 11/14/2022 0717   LYMPHSABS 1.2 06/22/2020 1058   MONOABS 0.8 11/14/2022 0717   EOSABS 0.2 11/14/2022 0717   EOSABS 0.1 06/22/2020 1058   BASOSABS 0.1 11/14/2022 0717   BASOSABS 0.0 06/22/2020 1058    BMET    Component Value Date/Time   NA 134 (L) 11/14/2022 0717   NA 134 06/22/2020 1058   K 4.1 11/14/2022 0717   CL 96 (L) 11/14/2022 0717   CO2 27 11/14/2022 0717   GLUCOSE 190 (H) 11/14/2022 0717   BUN 18 11/14/2022 0717   BUN 8 06/22/2020 1058   CREATININE 0.95 11/14/2022 0717   CALCIUM 9.0 11/14/2022 0717   GFRNONAA >60 11/14/2022 0717   GFRAA 107 06/22/2020 1058    INR    Component Value Date/Time   INR 1.3 (H) 11/02/2022 1825     Intake/Output Summary (Last 24 hours) at 11/19/2022 1550 Last data filed at 11/19/2022 1300 Gross per 24 hour  Intake --  Output 1580 ml  Net -1580 ml     Assessment/Plan:  62 y.o. male is s/p lower extremity BKA.  12 Days Post-Op   She is recovering as expected.  Patient will have his staples removed in another 10 days if he remains here.  In a discussion with the patient today he states that he is now homeless.  Person he was living with will not take him back.  Patient will remain in the hospital with placement issues until we can figure something out.  DVT prophylaxis: ASA 81 mg daily, Plavix 75 mg daily.  Heparin 5000 units subcu every 8 hours.   Marcie [facility] 11/19/2022 3:50 PM

## 2022-11-19 NOTE — Progress Notes (Signed)
Progress Note   Patient: Timothy [name]. ZOX:[id number] DOB: 08-07-1960 DOA: 11/02/2022     17 DOS: the patient was seen and examined on 11/19/2022   Brief hospital course: Timothy Corr. is a 62 y.o. male with medical history significant of hypertension, hyperlipidemia, diabetes mellitus, COPD on 2-3 L oxygen (2 L in the daytime, and 3L in night), diastolic CHF, depression with anxiety, PTSD, bipolar, DVT on Eliquis, who presents from podiatry office with bilateral foot ulcer and pain, unresponsive to outpatient abx.  Patient has a large and deep ulcer in right heel  and a small ulcer in the left plantar area.  05/17: MRI (+) osteomyelitis R calcaneus.  Patient had a right lower extremity angiogram with angioplasty followed by BKA on 5/21.  Antibiotics are completed.  Currently pending for placement.      Principal Problem:   Osteomyelitis of right foot (HCC) Active Problems:   Sepsis (HCC)   Type 2 diabetes mellitus with diabetic neuropathy, unspecified (HCC)   Essential hypertension   Chronic diastolic CHF (congestive heart failure) (HCC)   COPD (chronic obstructive pulmonary disease) (HCC)   HLD (hyperlipidemia)   Current smoker   Depression with anxiety   PAD (peripheral artery disease) (HCC)   Hyponatremia   Pressure injury of skin   History of DVT (deep vein thrombosis)   Encephalopathy acute   Reactive thrombocytosis   Assessment and Plan: Sepsis due to osteomyelitis of right calcaneus and R diabetic ulcer present on admission  L foot stage 3 diabetic/pressure ulcer present on admission PAD (peripheral artery disease) (HCC) Patient meets criteria for sepsis with WBC 14.8 and, heart rate 99, lactic acid normal 1.3. Blood culture negative.  Patient is status post below-knee amputation, antibiotics completed.   Acute metabolic encephalopathy secondary to hypoglycemia Hypoglycemia improved Type 2 diabetes mellitus with diabetic neuropathy, unspecified  (HCC): Conditions has improved.   Hypokalemia-improved     Essential hypertension Continue home medicine.   Chronic diastolic CHF (congestive heart failure) (HCC):  2D echo on 06/07/2021 showed EF of 55 to 60% with grade 1 diastolic dysfunction.  Patient does not have leg edema JVD.  CHF is compensated.   Stable, no exacerbation.   COPD (chronic obstructive pulmonary disease) (HCC): Stable Bronchodilators   History of DVT (deep venous thrombosis) (HCC) in March 2022 Used to be on elquis but according to patient he is no longer on eliquis. Records show repeat ultrasound in Dec 2022 showed resolution of DVT   Current smoker Nicotine patch   Depression with anxiety Continue home medications   No new issues, pending placement.    Subjective: No complaint.  Physical Exam: Vitals:   11/18/22 0824 11/18/22 1609 11/19/22 0008 11/19/22 0817  BP: 107/74 104/69 (!) 97/55 100/72  Pulse: 74 70 72 72  Resp: 18 18 17    Temp: 98.6 F (37 C) 97.6 F (36.4 C) 98.3 F (36.8 C) 97.7 F (36.5 C)  TempSrc:   Oral   SpO2: 100% 100% 92% 95%  Weight:      Height:       General exam: Appears calm and comfortable  Respiratory system: Clear to auscultation. Respiratory effort normal. Cardiovascular system: S1 & S2 heard, RRR. No JVD, murmurs, rubs, gallops or clicks. No pedal edema. Gastrointestinal system: Abdomen is nondistended, soft and nontender. No organomegaly or masses felt. Normal bowel sounds heard. Central nervous system: Alert and oriented. No focal neurological deficits. Extremities: Status post right BKA. Skin: No rashes, lesions or ulcers  Psychiatry: Judgement and insight appear normal. Mood & affect appropriate.    Data Reviewed:  There are no new results to review at this time.  Family Communication: None  Disposition: Status is: Inpatient Remains inpatient appropriate because: No discharge option.     Time spent: 25 minutes  Author: Marrion Coy,  MD 11/19/2022 12:52 PM  For on call review [email or web address].

## 2022-11-20 DIAGNOSIS — E1140 Type 2 diabetes mellitus with diabetic neuropathy, unspecified: Secondary | ICD-10-CM | POA: Diagnosis not present

## 2022-11-20 DIAGNOSIS — J449 Chronic obstructive pulmonary disease, unspecified: Secondary | ICD-10-CM | POA: Diagnosis not present

## 2022-11-20 DIAGNOSIS — Z794 Long term (current) use of insulin: Secondary | ICD-10-CM | POA: Diagnosis not present

## 2022-11-20 DIAGNOSIS — I5032 Chronic diastolic (congestive) heart failure: Secondary | ICD-10-CM | POA: Diagnosis not present

## 2022-11-20 LAB — GLUCOSE, CAPILLARY
Glucose-Capillary: 250 mg/dL — ABNORMAL HIGH (ref 70–99)
Glucose-Capillary: 245 mg/dL — ABNORMAL HIGH (ref 70–99)
Glucose-Capillary: 178 mg/dL — ABNORMAL HIGH (ref 70–99)
Glucose-Capillary: 176 mg/dL — ABNORMAL HIGH (ref 70–99)

## 2022-11-20 NOTE — Progress Notes (Signed)
Physical Therapy Treatment Patient Details Name: Timothy [name]. MRN: [id number] DOB: 02-04-1961 Today's Date: 11/20/2022   History of Present Illness Timothy Och. 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  Of note rapid response/code stroke called on 11/09/22 with pt ultimately found to by hypoglycemic with BG 24, no further testing per neuro.  PMH includes HTN, HLD, DM, COPD on 2-3L of O2, CHF, depression, PTSD, and bipolar.    PT Comments    Pt is making good progress towards goals and worked on [facility] transfers and self propulsion. Pt educated in [facility] parts and safe management with transfers. Pt motivated to participate and is requesting to work on stair training next session. Also will bring HEP and theraband for continued strengthening. Will continue to progress as able.   Recommendations for follow up therapy are one component of a multi-disciplinary discharge planning process, led by the attending physician.  Recommendations may be updated based on patient status, additional functional criteria and insurance authorization.  Follow Up Recommendations  Can patient physically be transported by private vehicle: Yes    Assistance Recommended at Discharge Intermittent Supervision/Assistance  Patient can return home with the following A little help with walking and/or transfers;A little help with bathing/dressing/bathroom;Assist for transportation;Help with stairs or ramp for entrance   Equipment Recommendations  Wheelchair (measurements PT);Wheelchair cushion (measurements PT);BSC/3in1;Rolling walker (2 wheels)    Recommendations for Other Services       Precautions / Restrictions Precautions Precautions: Fall Restrictions Weight Bearing Restrictions: Yes RLE Weight Bearing: Non weight bearing LLE Weight Bearing: Weight bearing as tolerated     Mobility  Bed Mobility Overal bed mobility: Modified Independent Bed Mobility: Supine to Sit            General bed mobility comments: safe technqiue with ease of mobility. Once seated at EOB, upright posture noted    Transfers Overall transfer level: Needs assistance Equipment used: Rolling walker (2 wheels) Transfers: Sit to/from Stand Sit to Stand: Supervision           General transfer comment: safe technique. Does tend to pull up on RW, however cues for hand placement and improved retention noted with further transfers.    Ambulation/Gait Ambulation/Gait assistance: Min guard Gait Distance (Feet): 25 Feet Assistive device: Rolling walker (2 wheels) Gait Pattern/deviations: Step-to pattern       General Gait Details: able to hop in room using RW. CGA given and cues for sequencing. IMproved technique.   Stairs             Merchant [employer] officer mobility: Yes Wheelchair propulsion: Both upper extremities Wheelchair parts: Supervision/cueing Distance: 200 Wheelchair Assistance Details (indicate cue type and reason): cues for braking and WC parts. Needs increased cues for turns and navigating obstacles  Modified Rankin (Stroke Patients Only)       Balance Overall balance assessment: History of Falls, Needs assistance Sitting-balance support: Feet supported, Bilateral upper extremity supported Sitting balance-Leahy Scale: Good     Standing balance support: Bilateral upper extremity supported Standing balance-Leahy Scale: Good                              Cognition Arousal/Alertness: Awake/alert Behavior During Therapy: Flat affect Overall Cognitive Status: Within Functional Limits for tasks assessed  General Comments: pleasant and agreeable to session        Exercises Total Joint Exercises Goniometric ROM: R knee AROM: 2-109 degrees    General Comments        Pertinent Vitals/Pain Pain Assessment Pain Assessment: No/denies pain    Home Living                           Prior Function            PT Goals (current goals can now be found in the care plan section) Acute Rehab PT Goals Patient Stated Goal: to go home PT Goal Formulation: With patient Time For Goal Achievement: 11/22/22 Potential to Achieve Goals: Good Progress towards PT goals: Progressing toward goals    Frequency    Min 4X/week      PT Plan Current plan remains appropriate    Co-evaluation              AM-PAC PT "6 Clicks" Mobility   Outcome Measure  Help needed turning from your back to your side while in a flat bed without using bedrails?: None Help needed moving from lying on your back to sitting on the side of a flat bed without using bedrails?: None Help needed moving to and from a bed to a chair (including a wheelchair)?: A Little Help needed standing up from a chair using your arms (e.g., wheelchair or bedside chair)?: A Little Help needed to walk in hospital room?: A Little Help needed climbing 3-5 steps with a railing? : A Lot 6 Click Score: 19    End of Session Equipment Utilized During Treatment: Gait belt Activity Tolerance: Patient tolerated treatment well;No increased pain;Patient limited by fatigue Patient left: in chair;with chair alarm set Nurse Communication: Mobility status PT Visit Diagnosis: Muscle weakness (generalized) (M62.81);Difficulty in walking, not elsewhere classified (R26.2);Repeated falls (R29.6)     Time: 1610-9604 PT Time Calculation (min) (ACUTE ONLY): 23 min  Charges:  $Gait Training: 8-22 mins $Wheel Chair Management: 8-22 mins                     Timothy [name], PT, DPT, GCS [phone]    Timothy [name] 11/20/2022, 12:06 PM

## 2022-11-20 NOTE — Progress Notes (Signed)
Progress Note   Patient: Timothy [name]. ZOX:[id number] DOB: [birth year] DOA: 11/02/2022     18 DOS: the patient was seen and examined on 11/20/2022   Brief hospital course: Timothy [name]. is a 62 y.o. male with medical history significant of hypertension, hyperlipidemia, diabetes mellitus, COPD on 2-3 L oxygen (2 L in the daytime, and 3L in night), diastolic CHF, depression with anxiety, PTSD, bipolar, DVT on Eliquis, who presents from podiatry office with bilateral foot ulcer and pain, unresponsive to outpatient abx.  Patient has a large and deep ulcer in right heel  and a small ulcer in the left plantar area.  05/17: MRI (+) osteomyelitis R calcaneus.  Patient had a right lower extremity angiogram with angioplasty followed by BKA on 5/21.  Antibiotics are completed.  Currently pending for placement.      Principal Problem:   Osteomyelitis of right foot (HCC) Active Problems:   Sepsis (HCC)   Type 2 diabetes mellitus with diabetic neuropathy, unspecified (HCC)   Essential hypertension   Chronic diastolic CHF (congestive heart failure) (HCC)   COPD (chronic obstructive pulmonary disease) (HCC)   HLD (hyperlipidemia)   Current smoker   Depression with anxiety   PAD (peripheral artery disease) (HCC)   Hyponatremia   Pressure injury of skin   History of DVT (deep vein thrombosis)   Encephalopathy acute   Reactive thrombocytosis   Assessment and Plan: Sepsis due to osteomyelitis of right calcaneus and R diabetic ulcer present on admission  L foot stage 3 diabetic/pressure ulcer present on admission PAD (peripheral artery disease) (HCC) Patient meets criteria for sepsis with WBC 14.8 and, heart rate 99, lactic acid normal 1.3. Blood culture negative.  Patient is status post below-knee amputation, antibiotics completed.   Acute metabolic encephalopathy secondary to hypoglycemia Hypoglycemia improved Type 2 diabetes mellitus with diabetic neuropathy, unspecified  (HCC): Conditions has improved.   Hypokalemia-improved     Essential hypertension Continue home medicine.   Chronic diastolic CHF (congestive heart failure) (HCC):  2D echo on 06/07/2021 showed EF of 55 to 60% with grade 1 diastolic dysfunction.  Patient does not have leg edema JVD.  CHF is compensated.   Stable, no exacerbation.   COPD (chronic obstructive pulmonary disease) (HCC): Stable Bronchodilators   History of DVT (deep venous thrombosis) (HCC) in March 2022 Used to be on elquis but according to patient he is no longer on eliquis. Records show repeat ultrasound in Dec 2022 showed resolution of DVT   Current smoker Nicotine patch   Depression with anxiety Continue home medications     Patient doing well, no new issues.  Currently pending placement.    Subjective: No complaints.  Physical Exam: Vitals:   11/19/22 1620 11/19/22 2359 11/20/22 0732 11/20/22 0757  BP: 121/72 106/63 (!) 78/56 108/74  Pulse: 65 75 80   Resp: 17 18 16    Temp: 97.7 F (36.5 C) 98.4 F (36.9 C) 98.3 F (36.8 C)   TempSrc:   Oral   SpO2: 99% 95% 98%   Weight:      Height:       General exam: Appears calm and comfortable  Respiratory system: Clear to auscultation. Respiratory effort normal. Cardiovascular system: S1 & S2 heard, RRR. No JVD, murmurs, rubs, gallops or clicks. No pedal edema. Gastrointestinal system: Abdomen is nondistended, soft and nontender. No organomegaly or masses felt. Normal bowel sounds heard. Central nervous system: Alert and oriented. No focal neurological deficits. Extremities: Right BKA Skin: No rashes, lesions  or ulcers Psychiatry: Judgement and insight appear normal. Mood & affect appropriate.    Data Reviewed:  There are no new results to review at this time.  Family Communication: None  Disposition: Status is: Inpatient Remains inpatient appropriate because: Unsafe discharge option.     Time spent: 25 minutes  Author: Marrion Coy,  MD 11/20/2022 11:33 AM  For on call review [email or web address].

## 2022-11-20 NOTE — Plan of Care (Signed)

## 2022-11-20 NOTE — Progress Notes (Signed)
Occupational Therapy Treatment Patient Details Name: Timothy [name]. MRN: [id number] DOB: August 21, 1960 Today's Date: 11/20/2022   History of present illness Montavious Debates. 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  Of note rapid response/code stroke called on 11/09/22 with pt ultimately found to by hypoglycemic with BG 24, no further testing per neuro.  PMH includes HTN, HLD, DM, COPD on 2-3L of O2, CHF, depression, PTSD, and bipolar.   OT comments  Pt seen for OT treatment on this date. Upon arrival to room pt laying in bed agreeable to tx, but requested to stay in bed. Pt performed UE exercises with green theraband. Pt demonstrated good effort and safe form during instructed exercises. Pt return demonstrated HEP without concerns. Pt making progress toward goals, will continue to follow POC. Discharge recommendation remains appropriate.     Recommendations for follow up therapy are one component of a multi-disciplinary discharge planning process, led by the attending physician.  Recommendations may be updated based on patient status, additional functional criteria and insurance authorization.    Assistance Recommended at Discharge Intermittent Supervision/Assistance  Patient can return home with the following  A little help with walking and/or transfers;A little help with bathing/dressing/bathroom;Assistance with cooking/housework;Direct supervision/assist for medications management;Assist for transportation;Help with stairs or ramp for entrance   Equipment Recommendations  Other (comment) (defer)    Recommendations for Other Services      Precautions / Restrictions Precautions Precautions: Fall Restrictions Weight Bearing Restrictions: Yes RLE Weight Bearing: Non weight bearing LLE Weight Bearing: Weight bearing as tolerated       Mobility Bed Mobility                    Transfers                         Balance                                            ADL either performed or assessed with clinical judgement   ADL                                              Extremity/Trunk Assessment Upper Extremity Assessment Upper Extremity Assessment: Overall WFL for tasks assessed            Vision       Perception     Praxis      Cognition Arousal/Alertness: Awake/alert Behavior During Therapy: Flat affect Overall Cognitive Status: Within Functional Limits for tasks assessed                                 General Comments: pleasant and agreeable to session if allowed to stay in bed        Exercises Exercises: General Upper Extremity General Exercises - Upper Extremity Shoulder Flexion: AROM, Strengthening, 10 reps, Both, Theraband Theraband Level (Shoulder Flexion): Level 3 (Green) Shoulder Extension: AROM, Strengthening, 10 reps, Both, Theraband Theraband Level (Shoulder Extension): Level 3 (Green) Shoulder Horizontal ABduction: AROM, Strengthening, 10 reps, Both, Theraband Theraband Level (Shoulder Horizontal Abduction): Level 3 (Green) Shoulder Horizontal ADduction: AROM, Strengthening,  10 reps, Both, Theraband Theraband Level (Shoulder Horizontal Adduction): Level 3 (Green) Elbow Flexion: AROM, Strengthening, 10 reps, Both, Theraband Theraband Level (Elbow Flexion): Level 3 (Green) Elbow Extension: AROM, Strengthening, 10 reps, Both, Theraband Theraband Level (Elbow Extension): Level 3 (Green)    Shoulder Instructions       General Comments      Pertinent Vitals/ Pain       Pain Assessment Pain Assessment: No/denies pain  Home Living                                          Prior Functioning/Environment              Frequency  Min 2X/week        Progress Toward Goals  OT Goals(current goals can now be found in the care plan section)     Acute Rehab OT Goals Patient Stated Goal: To feel  better OT Goal Formulation: With patient Time For Goal Achievement: 11/22/22 Potential to Achieve Goals: Good ADL Goals Pt Will Perform Grooming: with modified independence;sitting Pt Will Perform Lower Body Dressing: with modified independence;sitting/lateral leans;bed level;sit to/from stand Pt Will Transfer to Toilet: with modified independence;bedside commode Pt Will Perform Toileting - Clothing Manipulation and hygiene: with modified independence;sit to/from stand;sitting/lateral leans Pt Will Perform Tub/Shower Transfer: Tub transfer;tub bench  Plan      Co-evaluation                 AM-PAC OT "6 Clicks" Daily Activity     Outcome Measure   Help from another person eating meals?: None Help from another person taking care of personal grooming?: A Little Help from another person toileting, which includes using toliet, bedpan, or urinal?: A Lot Help from another person bathing (including washing, rinsing, drying)?: A Little Help from another person to put on and taking off regular upper body clothing?: None Help from another person to put on and taking off regular lower body clothing?: A Lot 6 Click Score: 18    End of Session    OT Visit Diagnosis: Repeated falls (R29.6);Other abnormalities of gait and mobility (R26.89)   Activity Tolerance Patient tolerated treatment well   Patient Left in bed;with call bell/phone within reach;with bed alarm set   Nurse Communication          Time: 1610-9604 OT Time Calculation (min): 9 min  Charges: OT General Charges $OT Visit: 1 Visit OT Treatments $Therapeutic Exercise: 8-22 mins  Thresa Ross, OTS

## 2022-11-21 ENCOUNTER — Inpatient Hospital Stay: Payer: Medicaid Other

## 2022-11-21 DIAGNOSIS — G9340 Encephalopathy, unspecified: Secondary | ICD-10-CM | POA: Diagnosis not present

## 2022-11-21 DIAGNOSIS — I10 Essential (primary) hypertension: Secondary | ICD-10-CM | POA: Diagnosis not present

## 2022-11-21 DIAGNOSIS — E1140 Type 2 diabetes mellitus with diabetic neuropathy, unspecified: Secondary | ICD-10-CM | POA: Diagnosis not present

## 2022-11-21 DIAGNOSIS — M868X7 Other osteomyelitis, ankle and foot: Secondary | ICD-10-CM | POA: Diagnosis not present

## 2022-11-21 LAB — GLUCOSE, CAPILLARY
Glucose-Capillary: 160 mg/dL — ABNORMAL HIGH (ref 70–99)
Glucose-Capillary: 146 mg/dL — ABNORMAL HIGH (ref 70–99)
Glucose-Capillary: 100 mg/dL — ABNORMAL HIGH (ref 70–99)
Glucose-Capillary: 210 mg/dL — ABNORMAL HIGH (ref 70–99)

## 2022-11-21 NOTE — Progress Notes (Signed)
Patient stated he was trying to reach for his urinal in the sink when he fell this afternoon (unwitnessed). He said no alarms went off and he climbed himself back into bed. He said he felt fine but now is in a lot of pain.   Floor mats are on both sides of bed, Bed alarm on, Urinal within reach, Call bell within reach.    11/21/22 2200  Provider Notification  Provider Name/Title Hannah Beat, MD  Date Provider Notified 11/21/22  Time Provider Notified 2200  Method of Notification Page (Secure chat)  Notification Reason Other (Comment) (pt in a lot of pain and stated he fell today on his R. BKA knee around 3/4 pm and that he got himself back in bed. Contacted Dr. Arville Care, on-call to make him aware of the situation. Incision site, clean, dry, and intact. MD ordered x-ray.)  Provider response See new orders  Date of Provider Response 11/21/22  Time of Provider Response 2200

## 2022-11-21 NOTE — Progress Notes (Signed)
  Progress Note   Patient: Timothy [name]. ZOX:[id number] DOB: 1961/02/16 DOA: 11/02/2022     19 DOS: the patient was seen and examined on 11/21/2022   Brief hospital course: Lazlo Fluharty. is a 62 y.o. male with medical history significant of hypertension, hyperlipidemia, diabetes mellitus, COPD on 2-3 L oxygen (2 L in the daytime, and 3L in night), diastolic CHF, depression with anxiety, PTSD, bipolar, DVT on Eliquis, who presents from podiatry office with bilateral foot ulcer and pain, unresponsive to outpatient abx.  Patient has a large and deep ulcer in right heel  and a small ulcer in the left plantar area.  05/17: MRI (+) osteomyelitis R calcaneus.  Patient had a right lower extremity angiogram with angioplasty followed by BKA on 5/21.  Antibiotics are completed.  Currently pending for placement.   6/5: Waiting for placement  Assessment and Plan:  Sepsis due to osteomyelitis of right calcaneus and R diabetic ulcer present on admission  L foot stage 3 diabetic/pressure ulcer present on admission PAD (peripheral artery disease) (HCC) Patient meets criteria for sepsis with WBC 14.8 and, heart rate 99, lactic acid normal 1.3. Blood culture negative.  Patient is status post below-knee amputation, antibiotics completed.   Acute metabolic encephalopathy secondary to hypoglycemia Hypoglycemia improved Type 2 diabetes mellitus with diabetic neuropathy, unspecified (HCC): Conditions has improved.   Hypokalemia-improved   Essential hypertension Continue home medicine.   Chronic diastolic CHF (congestive heart failure) (HCC):  2D echo on 06/07/2021 showed EF of 55 to 60% with grade 1 diastolic dysfunction.  Patient does not have leg edema JVD.  CHF is compensated.   Stable, no exacerbation.   COPD (chronic obstructive pulmonary disease) (HCC): Stable Bronchodilators   History of DVT (deep venous thrombosis) (HCC) in March 2022 Used to be on elquis but according to patient he is  no longer on eliquis. Records show repeat ultrasound in Dec 2022 showed resolution of DVT   Current smoker Nicotine patch   Depression with anxiety Continue home medications      Subjective: Remains stable  Physical Exam: Vitals:   11/20/22 1518 11/20/22 2127 11/20/22 2202 11/21/22 0751  BP: 107/72 (!) 146/97 122/76 116/73  Pulse: 69 84 69 63  Resp: 16  20 14   Temp: 98 F (36.7 C)  97.8 F (36.6 C) 98.1 F (36.7 C)  TempSrc:      SpO2: 99%  97% 94%  Weight:      Height:       General exam: Appears calm and comfortable  Respiratory system: Clear to auscultation. Respiratory effort normal. Cardiovascular system: S1 & S2 heard, RRR. No JVD, murmurs, rubs, gallops or clicks. No pedal edema. Gastrointestinal system: Abdomen is nondistended, soft and nontender. No organomegaly or masses felt. Normal bowel sounds heard. Central nervous system: Alert and oriented. No focal neurological deficits. Extremities: Right BKA Skin: No rashes, lesions or ulcers Psychiatry: Judgement and insight appear normal. Mood & affect appropriate.  Data Reviewed:  There are no new results to review at this time.  Family Communication: none a bedside  Disposition: Status is: Inpatient Remains inpatient appropriate because: Medically stable, waiting for placement.  TOC aware.  It is a difficult placement - likely long length of stay  Planned Discharge Destination: Skilled nursing facility   DVT prophylaxis-heparin Time spent: 15 minutes  Author: Delfino Lovett, MD 11/21/2022 8:01 PM  For on call review [email or web address].

## 2022-11-21 NOTE — Plan of Care (Signed)

## 2022-11-21 NOTE — TOC Progression Note (Signed)
Transition of Care (TOC) - Progression Note    Patient Details  Name: Timothy [name]. MRN: [id number] Date of Birth: 07-20-1960  Transition of Care Operating Room Services) CM/SW Contact  Marlowe Sax, RN Phone Number: 11/21/2022, 11:02 AM  Clinical Narrative:   Reached out to Tammy at [facility] she will check the bed offer and see if they can make a bed offer, She will follow up and let me know if they can accept the patient with an LOG    Expected Discharge Plan:  (TBD) Barriers to Discharge: Homeless with medical needs  Expected Discharge Plan and Services   Discharge Planning Services: CM Consult Post Acute Care Choice:  (TBD) Living arrangements for the past 2 months: Single Family Home                                       Social Determinants of Health (SDOH) Interventions SDOH Screenings   Food Insecurity: No Food Insecurity (11/02/2022)  Housing: Low Risk  (11/02/2022)  Transportation Needs: No Transportation Needs (11/02/2022)  Utilities: Not At Risk (11/02/2022)  Alcohol Screen: Low Risk  (02/10/2020)  Depression (PHQ2-9): High Risk (10/03/2022)  Financial Resource Strain: Medium Risk (05/28/2022)  Physical Activity: Inactive (05/28/2022)  Social Connections: Socially Isolated (05/28/2022)  Stress: Stress Concern Present (05/28/2022)  Tobacco Use: High Risk (11/08/2022)    Readmission Risk Interventions    11/06/2022   12:06 PM  Readmission Risk Prevention Plan  Transportation Screening Complete  PCP or Specialist Appt within 3-5 Days Complete  Social Work Consult for Recovery Care Planning/Counseling Complete  Palliative Care Screening Not Applicable  Medication Review Oceanographer) Referral to Pharmacy

## 2022-11-21 NOTE — Plan of Care (Signed)
  Problem: Coping: Goal: Ability to adjust to condition or change in health will improve Outcome: Progressing   Problem: Metabolic: Goal: Ability to maintain appropriate glucose levels will improve Outcome: Progressing   Problem: Skin Integrity: Goal: Risk for impaired skin integrity will decrease Outcome: Progressing   Problem: Tissue Perfusion: Goal: Adequacy of tissue perfusion will improve Outcome: Progressing   

## 2022-11-21 NOTE — Progress Notes (Addendum)
Physical Therapy Treatment Patient Details Name: Timothy [name]. MRN: [id number] DOB: February 24, 1961 Today's Date: 11/21/2022   History of Present Illness Timothy [name]. 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  Of note rapid response/code stroke called on 11/09/22 with pt ultimately found to by hypoglycemic with BG 24, no further testing per neuro.  PMH includes HTN, HLD, DM, COPD on 2-3L of O2, CHF, depression, PTSD, and bipolar.    PT Comments    Pt is making gradual progress towards goals, however limited this date due to pain. Pt due for updated goals this session. Still continues to require skilled PT needs. Goals updated this date. Requested pain meds over 1.5 hours ago. Therapeutic listening given and this Clinical research associate reached out for nursing to request meds with prompt response from nursing. Pt encouraged to perform OOB mobility, however declines and wishes to remain in bed. Agreeable to sitting at EOB and perform there-ex. Encouraged to continue with there-ex in PM. Will continue to progress as able.  Recommendations for follow up therapy are one component of a multi-disciplinary discharge planning process, led by the attending physician.  Recommendations may be updated based on patient status, additional functional criteria and insurance authorization.  Follow Up Recommendations  Can patient physically be transported by private vehicle: Yes    Assistance Recommended at Discharge Intermittent Supervision/Assistance  Patient can return home with the following A little help with walking and/or transfers;A little help with bathing/dressing/bathroom;Assist for transportation;Help with stairs or ramp for entrance   Equipment Recommendations  Wheelchair (measurements PT);Wheelchair cushion (measurements PT);BSC/3in1;Rolling walker (2 wheels)    Recommendations for Other Services       Precautions / Restrictions Precautions Precautions:  Fall Restrictions Weight Bearing Restrictions: Yes RLE Weight Bearing: Non weight bearing     Mobility  Bed Mobility Overal bed mobility: Modified Independent Bed Mobility: Supine to Sit     Supine to sit: Modified independent (Device/Increase time)     General bed mobility comments: pt demonstrates ease of movement to EOB. Once seated at EOB, able to tolerate for a few minutes prior to requesting to return back to bed    Transfers                   General transfer comment: declines to attempt OOB mobility despite encouragement    Ambulation/Gait                   Stairs             Wheelchair Mobility    Modified Rankin (Stroke Patients Only)       Balance Overall balance assessment: History of Falls, Needs assistance Sitting-balance support: Feet supported, Bilateral upper extremity supported Sitting balance-Leahy Scale: Good                                      Cognition Arousal/Alertness: Awake/alert Behavior During Therapy: Agitated Overall Cognitive Status: Within Functional Limits for tasks assessed                                 General Comments: agitated today, agreeable to limited session        Exercises Other Exercises Other Exercises: Supine/seated ther-ex with green theraband x 10 reps including UE diagonals, tricep pull downs, and scap  squeezes. Cues given for sequencing and technique. Encouraged to perform 2nd set in PM    General Comments        Pertinent Vitals/Pain Pain Assessment Pain Assessment: Faces Faces Pain Scale: Hurts even more Pain Location: RLE Pain Descriptors / Indicators: Aching, Sore Pain Intervention(s): Limited activity within patient's tolerance, Patient requesting pain meds-RN notified, RN gave pain meds during session, Repositioned    Home Living                          Prior Function            PT Goals (current goals can now be found in  the care plan section) Acute Rehab PT Goals Patient Stated Goal: to go home PT Goal Formulation: With patient Time For Goal Achievement: 12/05/22 Potential to Achieve Goals: Good Progress towards PT goals: Progressing toward goals    Frequency    Min 4X/week      PT Plan Current plan remains appropriate    Co-evaluation              AM-PAC PT "6 Clicks" Mobility   Outcome Measure  Help needed turning from your back to your side while in a flat bed without using bedrails?: None Help needed moving from lying on your back to sitting on the side of a flat bed without using bedrails?: None Help needed moving to and from a bed to a chair (including a wheelchair)?: A Little Help needed standing up from a chair using your arms (e.g., wheelchair or bedside chair)?: A Little Help needed to walk in hospital room?: A Little Help needed climbing 3-5 steps with a railing? : A Lot 6 Click Score: 19    End of Session   Activity Tolerance: Patient limited by pain Patient left: in bed;with bed alarm set Nurse Communication: Mobility status PT Visit Diagnosis: Muscle weakness (generalized) (M62.81);Difficulty in walking, not elsewhere classified (R26.2);Repeated falls (R29.6) Pain - Right/Left: Right Pain - part of body: Leg     Time: 1610-9604 PT Time Calculation (min) (ACUTE ONLY): 12 min  Charges:  $Therapeutic Exercise: 8-22 mins                     Elizabeth Palau, PT, DPT, GCS [phone]    Erskine Steinfeldt 11/21/2022, 2:21 PM

## 2022-11-21 NOTE — TOC Progression Note (Signed)
Transition of Care (TOC) - Progression Note    Patient Details  Name: Timothy [name]. MRN: [id number] Date of Birth: [birth year]  [facility]) CM/SW Contact  Marlowe Sax, RN Phone Number: 11/21/2022, 5:03 PM  Clinical Narrative:   [facility] will be able to make a bed offer if they have LOG in place to cover until he gets approved for disability, The patient reports that he has applied for the disability,     Expected Discharge Plan:  (TBD) Barriers to Discharge: Homeless with medical needs  Expected Discharge Plan and Services   Discharge Planning Services: CM Consult Post Acute Care Choice:  (TBD) Living arrangements for the past 2 months: Single Family Home                                       Social Determinants of Health (SDOH) Interventions SDOH Screenings   Food Insecurity: No Food Insecurity (11/02/2022)  Housing: Low Risk  (11/02/2022)  Transportation Needs: No Transportation Needs (11/02/2022)  Utilities: Not At Risk (11/02/2022)  Alcohol Screen: Low Risk  (02/10/2020)  Depression (PHQ2-9): High Risk (10/03/2022)  Financial Resource Strain: Medium Risk (05/28/2022)  Physical Activity: Inactive (05/28/2022)  Social Connections: Socially Isolated (05/28/2022)  Stress: Stress Concern Present (05/28/2022)  Tobacco Use: High Risk (11/08/2022)    Readmission Risk Interventions    11/06/2022   12:06 PM  Readmission Risk Prevention Plan  Transportation Screening Complete  PCP or Specialist Appt within 3-5 Days Complete  Social Work Consult for Recovery Care Planning/Counseling Complete  Palliative Care Screening Not Applicable  Medication Review Oceanographer) Referral to Pharmacy

## 2022-11-21 NOTE — Progress Notes (Signed)
Palliative Medicine  - Chart Check   Medical records reviewed including progress notes, labs, imaging. No acute changes.    Ryker Sudbury MS Ed.S, RN Palliative Medicine Team Phone: [phone]  

## 2022-11-22 DIAGNOSIS — M868X7 Other osteomyelitis, ankle and foot: Secondary | ICD-10-CM | POA: Diagnosis not present

## 2022-11-22 DIAGNOSIS — I5032 Chronic diastolic (congestive) heart failure: Secondary | ICD-10-CM | POA: Diagnosis not present

## 2022-11-22 DIAGNOSIS — E1140 Type 2 diabetes mellitus with diabetic neuropathy, unspecified: Secondary | ICD-10-CM | POA: Diagnosis not present

## 2022-11-22 DIAGNOSIS — J449 Chronic obstructive pulmonary disease, unspecified: Secondary | ICD-10-CM | POA: Diagnosis not present

## 2022-11-22 LAB — BASIC METABOLIC PANEL
Anion gap: 11 (ref 5–15)
BUN: 20 mg/dL (ref 6–20)
CO2: 27 mmol/L (ref 22–32)
Calcium: 9.3 mg/dL (ref 8.9–10.3)
Chloride: 98 mmol/L (ref 98–111)
Creatinine, Ser: 0.94 mg/dL (ref 0.61–1.24)
GFR, Estimated: >60 mL/min (ref >60)
Glucose, Bld: 136 mg/dL — ABNORMAL HIGH (ref 70–99)
Potassium: 4.1 mmol/L (ref 3.5–5.1)
Sodium: 136 mmol/L (ref 135–145)

## 2022-11-22 LAB — GLUCOSE, CAPILLARY
Glucose-Capillary: 172 mg/dL — ABNORMAL HIGH (ref 70–99)
Glucose-Capillary: 175 mg/dL — ABNORMAL HIGH (ref 70–99)
Glucose-Capillary: 149 mg/dL — ABNORMAL HIGH (ref 70–99)
Glucose-Capillary: 164 mg/dL — ABNORMAL HIGH (ref 70–99)

## 2022-11-22 LAB — CBC
HCT: 31.7 % — ABNORMAL LOW (ref 39.0–52.0)
Hemoglobin: 9.9 g/dL — ABNORMAL LOW (ref 13.0–17.0)
MCH: 27.8 pg (ref 26.0–34.0)
MCHC: 31.2 g/dL (ref 30.0–36.0)
MCV: 89 fL (ref 80.0–100.0)
Platelets: 379 10*3/uL (ref 150–400)
RBC: 3.56 MIL/uL — ABNORMAL LOW (ref 4.22–5.81)
RDW: 13.7 % (ref 11.5–15.5)
WBC: 5.6 10*3/uL (ref 4.0–10.5)
nRBC: 0 % (ref 0.0–0.2)

## 2022-11-22 NOTE — Progress Notes (Signed)
Physical Therapy Treatment Patient Details Name: Timothy [name]. MRN: [id number] DOB: 1960-08-26 Today's Date: 11/22/2022   History of Present Illness Timothy [name]. 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  Of note rapid response/code stroke called on 11/09/22 with pt ultimately found to by hypoglycemic with BG 24, no further testing per neuro.  PMH includes HTN, HLD, DM, COPD on 2-3L of O2, CHF, depression, PTSD, and bipolar.    PT Comments    Pt is making good progress towards goals with ability to ambulate short distances in room and agreeable to sit in recliner post session. Brought WC and pt able to self propel in hallway with improved retention of WC parts and management. Improved speed this date and less fatigue. Of note, pt fall documented yesterday, no injuries, however reports increased R knee pain, premedicated prior to session. Reviewed HEP and encouraged to continue performance in PM.  Recommendations for follow up therapy are one component of a multi-disciplinary discharge planning process, led by the attending physician.  Recommendations may be updated based on patient status, additional functional criteria and insurance authorization.  Follow Up Recommendations  Can patient physically be transported by private vehicle: Yes    Assistance Recommended at Discharge Intermittent Supervision/Assistance  Patient can return home with the following A little help with walking and/or transfers;A little help with bathing/dressing/bathroom;Assist for transportation;Help with stairs or ramp for entrance   Equipment Recommendations  Wheelchair (measurements PT);Wheelchair cushion (measurements PT);BSC/3in1;Rolling walker (2 wheels)    Recommendations for Other Services       Precautions / Restrictions Precautions Precautions: Fall Restrictions Weight Bearing Restrictions: Yes RLE Weight Bearing: Non weight bearing LLE Weight Bearing: Weight  bearing as tolerated     Mobility  Bed Mobility Overal bed mobility: Modified Independent Bed Mobility: Supine to Sit     Supine to sit: Modified independent (Device/Increase time)     General bed mobility comments: safe technique and ease of effort    Transfers Overall transfer level: Needs assistance Equipment used: Rolling walker (2 wheels) Transfers: Sit to/from Stand Sit to Stand: Min guard   Step pivot transfers: Min guard       General transfer comment: Pt is able to perform step pivot transfer from bed->WC. Safe technique    Ambulation/Gait Ambulation/Gait assistance: Min Paediatric nurse (Feet): 12 Feet Assistive device: Rolling walker (2 wheels) Gait Pattern/deviations: Step-to pattern       General Gait Details: able to hop in room with RW. Safe technique, however fatigues with cues   Psychologist, counselling mobility: Yes Wheelchair propulsion: Both upper extremities Wheelchair parts: Supervision/cueing Distance: 300 Wheelchair Assistance Details (indicate cue type and reason): improved retention with WC parts management with decreased cues required. Improved endurance and increased speed noted.  Modified Rankin (Stroke Patients Only)       Balance Overall balance assessment: History of Falls, Needs assistance Sitting-balance support: Feet supported, Bilateral upper extremity supported Sitting balance-Leahy Scale: Good     Standing balance support: Bilateral upper extremity supported Standing balance-Leahy Scale: Good                              Cognition Arousal/Alertness: Awake/alert Behavior During Therapy: WFL for tasks assessed/performed Overall Cognitive Status: Within Functional Limits for tasks assessed  General Comments: Pt reports he is still very frustrated this date with multiple complaints about nursing care. Pt  premedicated prior to arrival        Exercises      General Comments        Pertinent Vitals/Pain Pain Assessment Pain Assessment: Faces Faces Pain Scale: Hurts even more Pain Location: RLE Pain Descriptors / Indicators: Aching, Sore Pain Intervention(s): Limited activity within patient's tolerance, Premedicated before session, Repositioned    Home Living                          Prior Function            PT Goals (current goals can now be found in the care plan section) Acute Rehab PT Goals Patient Stated Goal: to go home PT Goal Formulation: With patient Time For Goal Achievement: 12/05/22 Potential to Achieve Goals: Good Progress towards PT goals: Progressing toward goals    Frequency    Min 4X/week      PT Plan Current plan remains appropriate    Co-evaluation              AM-PAC PT "6 Clicks" Mobility   Outcome Measure  Help needed turning from your back to your side while in a flat bed without using bedrails?: None Help needed moving from lying on your back to sitting on the side of a flat bed without using bedrails?: None Help needed moving to and from a bed to a chair (including a wheelchair)?: A Little Help needed standing up from a chair using your arms (e.g., wheelchair or bedside chair)?: A Little Help needed to walk in hospital room?: A Little Help needed climbing 3-5 steps with a railing? : A Lot 6 Click Score: 19    End of Session Equipment Utilized During Treatment: Gait belt Activity Tolerance: Patient limited by pain Patient left: in chair;with chair alarm set Nurse Communication: Mobility status PT Visit Diagnosis: Muscle weakness (generalized) (M62.81);Difficulty in walking, not elsewhere classified (R26.2);Repeated falls (R29.6) Pain - Right/Left: Right Pain - part of body: Leg     Time: 1610-9604 PT Time Calculation (min) (ACUTE ONLY): 25 min  Charges:  $Gait Training: 8-22 mins $Wheel Chair Management: 8-22  mins                     Elizabeth Palau, PT, DPT, GCS [phone]    Jordie Skalsky 11/22/2022, 1:45 PM

## 2022-11-22 NOTE — Progress Notes (Signed)
Progress Note   Patient: Timothy [name]. ZOX:[id number] DOB: 1961/06/13 DOA: 11/02/2022     20 DOS: the patient was seen and examined on 11/22/2022   Brief hospital course: Timothy [name]. is a 62 y.o. male with medical history significant of hypertension, hyperlipidemia, diabetes mellitus, COPD on 2-3 L oxygen (2 L in the daytime, and 3L in night), diastolic CHF, depression with anxiety, PTSD, bipolar, DVT on Eliquis, who presents from podiatry office with bilateral foot ulcer and pain, unresponsive to outpatient abx.  Patient has a large and deep ulcer in right heel  and a small ulcer in the left plantar area.  05/17: MRI (+) osteomyelitis R calcaneus.  Patient had a right lower extremity angiogram with angioplasty followed by BKA on 5/21.  Antibiotics are completed.  Currently pending for placement.   6/5: Waiting for placement 6/6: Fell overnight.  No injuries.  Right knee x-ray negative for any acute changes  Assessment and Plan:  Sepsis due to osteomyelitis of right calcaneus and R diabetic ulcer present on admission  L foot stage 3 diabetic/pressure ulcer present on admission PAD (peripheral artery disease) (HCC) Patient meets criteria for sepsis with WBC 14.8 and, heart rate 99, lactic acid normal 1.3. Blood culture negative.  Patient is status post below-knee amputation, antibiotics completed.   Acute metabolic encephalopathy secondary to hypoglycemia Hypoglycemia improved Type 2 diabetes mellitus with diabetic neuropathy, unspecified (HCC): Conditions has improved.   Hypokalemia-improved   Essential hypertension Continue home medicine.   Chronic diastolic CHF (congestive heart failure) (HCC):  2D echo on 06/07/2021 showed EF of 55 to 60% with grade 1 diastolic dysfunction.  Patient does not have leg edema JVD.  CHF is compensated.   Stable, no exacerbation.   COPD (chronic obstructive pulmonary disease) (HCC): Stable Bronchodilators   History of DVT (deep venous  thrombosis) (HCC) in March 2022 Used to be on elquis but according to patient he is no longer on eliquis. Records show repeat ultrasound in Dec 2022 showed resolution of DVT   Current smoker Nicotine patch   Depression with anxiety Continue home medications  Patient fell overnight on right BKA stump.  X-ray negative for any acute pathology   Subjective: No new issues  Physical Exam: Vitals:   11/21/22 0751 11/21/22 2117 11/21/22 2141 11/22/22 0834  BP: 116/73 113/67 133/82 109/74  Pulse: 63 63  69  Resp: 14 20  18   Temp: 98.1 F (36.7 C) 98.6 F (37 C)  98.4 F (36.9 C)  TempSrc:    Oral  SpO2: 94% 97%  100%  Weight:      Height:       General exam: Appears calm and comfortable  Respiratory system: Clear to auscultation. Respiratory effort normal. Cardiovascular system: S1 & S2 heard, RRR. No JVD, murmurs, rubs, gallops or clicks. No pedal edema. Gastrointestinal system: Abdomen is nondistended, soft and nontender. No organomegaly or masses felt. Normal bowel sounds heard. Central nervous system: Alert and oriented. No focal neurological deficits. Extremities: Right BKA Skin: No rashes, lesions or ulcers Psychiatry: Judgement and insight appear normal. Mood & affect appropriate.  Data Reviewed:  There are no new results to review at this time.  Family Communication: None at bedside  Disposition: Status is: Inpatient Remains inpatient appropriate because: Medically stable, waiting for placement.  TOC aware.  It is a difficult placement - likely long length of stay   Planned Discharge Destination: Skilled nursing facility   DVT prophylaxis-heparin Time spent: 15 minutes  Author: Delfino Lovett, MD 11/22/2022 1:26 PM  For on call review [email or web address].

## 2022-11-22 NOTE — Plan of Care (Signed)
  Problem: Coping: Goal: Ability to adjust to condition or change in health will improve Outcome: Progressing   Problem: Skin Integrity: Goal: Risk for impaired skin integrity will decrease Outcome: Progressing   Problem: Health Behavior/Discharge Planning: Goal: Ability to manage health-related needs will improve Outcome: Progressing

## 2022-11-23 DIAGNOSIS — I10 Essential (primary) hypertension: Secondary | ICD-10-CM | POA: Diagnosis not present

## 2022-11-23 DIAGNOSIS — G9340 Encephalopathy, unspecified: Secondary | ICD-10-CM | POA: Diagnosis not present

## 2022-11-23 DIAGNOSIS — M868X7 Other osteomyelitis, ankle and foot: Secondary | ICD-10-CM | POA: Diagnosis not present

## 2022-11-23 DIAGNOSIS — I5032 Chronic diastolic (congestive) heart failure: Secondary | ICD-10-CM | POA: Diagnosis not present

## 2022-11-23 LAB — GLUCOSE, CAPILLARY
Glucose-Capillary: 202 mg/dL — ABNORMAL HIGH (ref 70–99)
Glucose-Capillary: 199 mg/dL — ABNORMAL HIGH (ref 70–99)
Glucose-Capillary: 124 mg/dL — ABNORMAL HIGH (ref 70–99)
Glucose-Capillary: 188 mg/dL — ABNORMAL HIGH (ref 70–99)

## 2022-11-23 NOTE — Progress Notes (Signed)
OT Cancellation Note  Patient Details Name: Timothy [name]. MRN: [id number] DOB: 09-May-1961   Cancelled Treatment:    Reason Eval/Treat Not Completed: Patient declined, no reason specified. Attempted x2 this date, pt sleeping AM and PM attempts, drowsy and stating need for sleep. Will re-attempt as available.   Kathie Dike, M.S. OTR/L  11/23/22, 2:13 PM  ascom [phone]

## 2022-11-23 NOTE — Progress Notes (Signed)
PT Cancellation Note  Patient Details Name: Timothy [name]. MRN: [id number] DOB: 1961-04-13   Cancelled Treatment:    Reason Eval/Treat Not Completed: Fatigue/lethargy limiting ability to participate Patient lethargic, reports he is very tired. Declines PT at this time. Will re-attempt at later date.    Whittaker Lenis 11/23/2022, 1:37 PM

## 2022-11-23 NOTE — Progress Notes (Signed)
Progress Note   Patient: Timothy [name]. GNF:[id number] DOB: [birth year] DOA: 11/02/2022     21 DOS: the patient was seen and examined on 11/23/2022   Brief hospital course: Timothy [name]. is a 62 y.o. male with medical history significant of hypertension, hyperlipidemia, diabetes mellitus, COPD on 2-3 L oxygen (2 L in the daytime, and 3L in night), diastolic CHF, depression with anxiety, PTSD, bipolar, DVT on Eliquis, who presents from podiatry office with bilateral foot ulcer and pain, unresponsive to outpatient abx.  Patient has a large and deep ulcer in right heel  and a small ulcer in the left plantar area.  05/17: MRI (+) osteomyelitis R calcaneus.  Patient had a right lower extremity angiogram with angioplasty followed by BKA on 5/21.  Antibiotics are completed.  Currently pending for placement.   6/5: Waiting for placement 6/6: Fell overnight.  No injuries.  Right knee x-ray negative for any acute changes 6/7: Waiting for placement  Assessment and Plan:  Sepsis due to osteomyelitis of right calcaneus and R diabetic ulcer present on admission  L foot stage 3 diabetic/pressure ulcer present on admission PAD (peripheral artery disease) (HCC) Patient meets criteria for sepsis with WBC 14.8 and, heart rate 99, lactic acid normal 1.3. Blood culture negative.  Patient is status post below-knee amputation, antibiotics completed.   Acute metabolic encephalopathy secondary to hypoglycemia Hypoglycemia improved Type 2 diabetes mellitus with diabetic neuropathy, unspecified (HCC): Conditions has improved.   Hypokalemia-improved   Essential hypertension Continue home medicine.   Chronic diastolic CHF (congestive heart failure) (HCC):  2D echo on 06/07/2021 showed EF of 55 to 60% with grade 1 diastolic dysfunction.  Patient does not have leg edema JVD.  CHF is compensated.   Stable, no exacerbation.   COPD (chronic obstructive pulmonary disease) (HCC): Stable Bronchodilators    History of DVT (deep venous thrombosis) (HCC) in March 2022 Used to be on elquis but according to patient he is no longer on eliquis. Records show repeat ultrasound in Dec 2022 showed resolution of DVT   Current smoker Nicotine patch   Depression with anxiety Continue home medications   Patient fell overnight on 6/6 on right BKA stump.  X-ray negative for any acute pathology     Subjective: Requesting to talk with TOC  Physical Exam: Vitals:   11/22/22 1551 11/22/22 2211 11/22/22 2340 11/23/22 0859  BP: 104/73 110/72 (!) 96/58 104/64  Pulse: 64  64 62  Resp: 18  18 18   Temp: (!) 97.4 F (36.3 C)  97.7 F (36.5 C) 98.3 F (36.8 C)  TempSrc:      SpO2: 100%  99% 95%  Weight:      Height:       General exam: Appears calm and comfortable  Respiratory system: Clear to auscultation. Respiratory effort normal. Cardiovascular system: S1 & S2 heard, RRR. No JVD, murmurs, rubs, gallops or clicks. No pedal edema. Gastrointestinal system: Abdomen is nondistended, soft and nontender. No organomegaly or masses felt. Normal bowel sounds heard. Central nervous system: Alert and oriented. No focal neurological deficits. Extremities: Right BKA Skin: No rashes, lesions or ulcers Psychiatry: Judgement and insight appear normal. Mood & affect appropriate.  Data Reviewed:  There are no new results to review at this time.  Family Communication: None at bedside  Disposition: Status is: Inpatient Remains inpatient appropriate because: Medically stable, waiting for placement  Planned Discharge Destination: Skilled nursing facility   DVT prophylaxis-heparin Time spent: 15 minutes  Author: Delfino Lovett,  MD 11/23/2022 12:10 PM  For on call review [email or web address].

## 2022-11-23 NOTE — TOC Progression Note (Signed)
Transition of Care (TOC) - Progression Note    Patient Details  Name: Eirik Schueler. MRN: [id number] Date of Birth: Oct 18, 1960  [facility]) CM/SW Contact  Marlowe Sax, RN Phone Number: 11/23/2022, 12:27 PM  Clinical Narrative:    [facility] is not able to accept the patient due to being on Sexual registry even if has a LOG   Expected Discharge Plan:  (TBD) Barriers to Discharge: Homeless with medical needs  Expected Discharge Plan and Services   Discharge Planning Services: CM Consult Post Acute Care Choice:  (TBD) Living arrangements for the past 2 months: Single Family Home                                       Social Determinants of Health (SDOH) Interventions SDOH Screenings   Food Insecurity: No Food Insecurity (11/02/2022)  Housing: Low Risk  (11/02/2022)  Transportation Needs: No Transportation Needs (11/02/2022)  Utilities: Not At Risk (11/02/2022)  Alcohol Screen: Low Risk  (02/10/2020)  Depression (PHQ2-9): High Risk (10/03/2022)  Financial Resource Strain: Medium Risk (05/28/2022)  Physical Activity: Inactive (05/28/2022)  Social Connections: Socially Isolated (05/28/2022)  Stress: Stress Concern Present (05/28/2022)  Tobacco Use: High Risk (11/08/2022)    Readmission Risk Interventions    11/06/2022   12:06 PM  Readmission Risk Prevention Plan  Transportation Screening Complete  PCP or Specialist Appt within 3-5 Days Complete  Social Work Consult for Recovery Care Planning/Counseling Complete  Palliative Care Screening Not Applicable  Medication Review Oceanographer) Referral to Pharmacy

## 2022-11-23 NOTE — Plan of Care (Signed)

## 2022-11-23 NOTE — TOC Progression Note (Signed)
Transition of Care (TOC) - Progression Note    Patient Details  Name: Timothy [name]. MRN: [id number] Date of Birth: 07-12-1960  Transition of Care [facility]) CM/SW Contact  Marlowe Sax, RN Phone Number: 11/23/2022, 3:48 PM  Clinical Narrative:    Spoke to the patient about DC plan, he does have his disability in place and gets a check in the mail, we discussed about not being able to go to STR due to sex offender list, he stated understanding I provided  him with low income housing resources and he will call around to see if he cn find housing   Expected Discharge Plan:  (TBD) Barriers to Discharge: Homeless with medical needs  Expected Discharge Plan and Services   Discharge Planning Services: CM Consult Post Acute Care Choice:  (TBD) Living arrangements for the past 2 months: Single Family Home                                       Social Determinants of Health (SDOH) Interventions SDOH Screenings   Food Insecurity: No Food Insecurity (11/02/2022)  Housing: Low Risk  (11/02/2022)  Transportation Needs: No Transportation Needs (11/02/2022)  Utilities: Not At Risk (11/02/2022)  Alcohol Screen: Low Risk  (02/10/2020)  Depression (PHQ2-9): High Risk (10/03/2022)  Financial Resource Strain: Medium Risk (05/28/2022)  Physical Activity: Inactive (05/28/2022)  Social Connections: Socially Isolated (05/28/2022)  Stress: Stress Concern Present (05/28/2022)  Tobacco Use: High Risk (11/08/2022)    Readmission Risk Interventions    11/06/2022   12:06 PM  Readmission Risk Prevention Plan  Transportation Screening Complete  PCP or Specialist Appt within 3-5 Days Complete  Social Work Consult for Recovery Care Planning/Counseling Complete  Palliative Care Screening Not Applicable  Medication Review Oceanographer) Referral to Pharmacy

## 2022-11-24 DIAGNOSIS — J449 Chronic obstructive pulmonary disease, unspecified: Secondary | ICD-10-CM | POA: Diagnosis not present

## 2022-11-24 DIAGNOSIS — M868X7 Other osteomyelitis, ankle and foot: Secondary | ICD-10-CM | POA: Diagnosis not present

## 2022-11-24 DIAGNOSIS — I5032 Chronic diastolic (congestive) heart failure: Secondary | ICD-10-CM | POA: Diagnosis not present

## 2022-11-24 DIAGNOSIS — I739 Peripheral vascular disease, unspecified: Secondary | ICD-10-CM | POA: Diagnosis not present

## 2022-11-24 LAB — GLUCOSE, CAPILLARY
Glucose-Capillary: 170 mg/dL — ABNORMAL HIGH (ref 70–99)
Glucose-Capillary: 207 mg/dL — ABNORMAL HIGH (ref 70–99)

## 2022-11-24 MED ORDER — VITAMIN B-12 1000 MCG PO TABS: 1000.0000 ug | ORAL_TABLET | Freq: Every day | ORAL | 0 refills | Status: AC

## 2022-11-24 MED ORDER — MOMETASONE FURO-FORMOTEROL FUM 100-5 MCG/ACT IN AERO: 2.0000 | INHALATION_SPRAY | Freq: Two times a day (BID) | RESPIRATORY_TRACT | 0 refills | Status: DC

## 2022-11-24 MED ORDER — NICOTINE 14 MG/24HR TD PT24: 14.0000 mg | MEDICATED_PATCH | TRANSDERMAL | 0 refills | Status: AC

## 2022-11-24 MED ORDER — ASPIRIN 81 MG PO TBEC: 81.0000 mg | DELAYED_RELEASE_TABLET | Freq: Every day | ORAL | 0 refills | Status: AC

## 2022-11-24 MED ORDER — PEN NEEDLES 32G X 6 MM MISC: 1.0000 | Freq: Every day | 0 refills | Status: AC

## 2022-11-24 MED ORDER — INSULIN GLARGINE 100 UNIT/ML SOLOSTAR PEN: 25.0000 [IU] | PEN_INJECTOR | Freq: Every day | SUBCUTANEOUS | 0 refills | Status: DC

## 2022-11-24 MED ORDER — ROSUVASTATIN CALCIUM 5 MG PO TABS: ORAL_TABLET | ORAL | 0 refills | Status: DC

## 2022-11-24 MED ORDER — CLOPIDOGREL BISULFATE 75 MG PO TABS: 75.0000 mg | ORAL_TABLET | Freq: Every day | ORAL | 0 refills | Status: DC

## 2022-11-24 NOTE — Progress Notes (Signed)
Progress Note   Patient: Timothy [name]. ZOX:[id number] DOB: 1960-09-12 DOA: 11/02/2022     22 DOS: the patient was seen and examined on 11/24/2022   Brief hospital course: Timothy [name]. is a 62 y.o. male with medical history significant of hypertension, hyperlipidemia, diabetes mellitus, COPD on 2-3 L oxygen (2 L in the daytime, and 3L in night), diastolic CHF, depression with anxiety, PTSD, bipolar, DVT on Eliquis, who presents from podiatry office with bilateral foot ulcer and pain, unresponsive to outpatient abx.  Patient has a large and deep ulcer in right heel  and a small ulcer in the left plantar area.  05/17: MRI (+) osteomyelitis R calcaneus.  Patient had a right lower extremity angiogram with angioplasty followed by BKA on 5/21.  Antibiotics are completed.  Currently pending for placement.   6/5: Waiting for placement 6/6: Fell overnight.  No injuries.  Right knee x-ray negative for any acute changes 6/7: Waiting for placement   Principal Problem:   Osteomyelitis of right foot (HCC) Active Problems:   Sepsis (HCC)   Type 2 diabetes mellitus with diabetic neuropathy, unspecified (HCC)   Essential hypertension   Chronic diastolic CHF (congestive heart failure) (HCC)   COPD (chronic obstructive pulmonary disease) (HCC)   HLD (hyperlipidemia)   Current smoker   Depression with anxiety   PAD (peripheral artery disease) (HCC)   Hyponatremia   Pressure injury of skin   History of DVT (deep vein thrombosis)   Encephalopathy acute   Reactive thrombocytosis   Assessment and Plan: Sepsis due to osteomyelitis of right calcaneus and R diabetic ulcer present on admission  L foot stage 3 diabetic/pressure ulcer present on admission PAD (peripheral artery disease) (HCC) Patient meets criteria for sepsis with WBC 14.8 and, heart rate 99, lactic acid normal 1.3. Blood culture negative.  Patient is status post below-knee amputation, antibiotics completed.   Acute metabolic  encephalopathy secondary to hypoglycemia Hypoglycemia improved Type 2 diabetes mellitus with diabetic neuropathy, unspecified (HCC): Conditions has improved.   Hypokalemia-improved   Essential hypertension Continue home medicine.   Chronic diastolic CHF (congestive heart failure) (HCC):  2D echo on 06/07/2021 showed EF of 55 to 60% with grade 1 diastolic dysfunction.  Patient does not have leg edema JVD.  CHF is compensated.   Stable, no exacerbation.   COPD (chronic obstructive pulmonary disease) (HCC): Stable Bronchodilators   History of DVT (deep venous thrombosis) (HCC) in March 2022 Used to be on elquis but according to patient he is no longer on eliquis. Records show repeat ultrasound in Dec 2022 showed resolution of DVT   Current smoker Nicotine patch   Depression with anxiety Continue home medications  No new issues, patient states that he may have home to go to, will let social worker know to investigate.    Subjective: No complaint today, saying he might have home to go to.  Physical Exam: Vitals:   11/23/22 1402 11/23/22 1651 11/23/22 2117 11/24/22 0825  BP: 103/70 104/62 111/75 117/80  Pulse: 62 60 (!) 58 72  Resp: 20 17 17 16   Temp: 98.1 F (36.7 C) 98.1 F (36.7 C) 98.2 F (36.8 C) 97.9 F (36.6 C)  TempSrc:  Oral  Oral  SpO2: 96% 100% 100% 100%  Weight:      Height:       General exam: Appears calm and comfortable  Respiratory system: Clear to auscultation. Respiratory effort normal. Cardiovascular system: S1 & S2 heard, RRR. No JVD, murmurs, rubs, gallops  or clicks. No pedal edema. Gastrointestinal system: Abdomen is nondistended, soft and nontender. No organomegaly or masses felt. Normal bowel sounds heard. Central nervous system: Alert and oriented. No focal neurological deficits. Extremities: Right BKA. Skin: No rashes, lesions or ulcers Psychiatry: Judgement and insight appear normal. Mood & affect appropriate.    Data Reviewed:  There  are no new results to review at this time.  Family Communication: None  Disposition: Status is: Inpatient Remains inpatient appropriate because: Unsafe discharge option.     Time spent: 25 minutes  Author: Marrion Coy, MD 11/24/2022 1:57 PM  For on call review [email or web address].

## 2022-11-24 NOTE — Discharge Summary (Signed)
Physician Discharge Summary   Patient: Timothy [name]. MRN: [id number] DOB: 01/03/1961  Admit date:     11/02/2022  Discharge date: 11/24/22  Discharge Physician: Marrion Coy   PCP: Nira Retort   Recommendations at discharge:    Follow with PCP in  1 week.  Discharge Diagnoses: Principal Problem:   Osteomyelitis of right foot (HCC) Active Problems:   Sepsis (HCC)   Type 2 diabetes mellitus with diabetic neuropathy, unspecified (HCC)   Essential hypertension   Chronic diastolic CHF (congestive heart failure) (HCC)   COPD (chronic obstructive pulmonary disease) (HCC)   HLD (hyperlipidemia)   Current smoker   Depression with anxiety   PAD (peripheral artery disease) (HCC)   Hyponatremia   Pressure injury of skin   History of DVT (deep vein thrombosis)   Encephalopathy acute   Reactive thrombocytosis  Resolved Problems:   DVT (deep venous thrombosis) (HCC) Stage 3 pressure ulcer on foot, resolved after BKA Hospital Course: Timothy [name]. is a 62 y.o. male with medical history significant of hypertension, hyperlipidemia, diabetes mellitus, COPD on 2-3 L oxygen (2 L in the daytime, and 3L in night), diastolic CHF, depression with anxiety, PTSD, bipolar, DVT on Eliquis, who presents from podiatry office with bilateral foot ulcer and pain, unresponsive to outpatient abx.  Patient has a large and deep ulcer in right heel  and a small ulcer in the left plantar area.  05/17: MRI (+) osteomyelitis R calcaneus.  Patient had a right lower extremity angiogram with angioplasty followed by BKA on 5/21.  Antibiotics are completed.  Currently pending for placement.  Patient now wants to go home, and TOC has confirmed that patient has place to live.  Assessment and Plan: Sepsis due to osteomyelitis of right calcaneus and R diabetic ulcer present on admission  L foot stage 3 diabetic/pressure ulcer present on admission PAD (peripheral artery disease) (HCC) Patient meets  criteria for sepsis with WBC 14.8 and, heart rate 99, lactic acid normal 1.3. Blood culture negative.  Patient is status post below-knee amputation, antibiotics completed.   Acute metabolic encephalopathy secondary to hypoglycemia Hypoglycemia improved Type 2 diabetes mellitus with diabetic neuropathy, unspecified (HCC): Conditions has improved.   Hypokalemia-improved   Essential hypertension Continue home medicine.   Chronic diastolic CHF (congestive heart failure) (HCC):  2D echo on 06/07/2021 showed EF of 55 to 60% with grade 1 diastolic dysfunction.  Patient does not have leg edema JVD.  CHF is compensated.   Stable, no exacerbation.   COPD (chronic obstructive pulmonary disease) (HCC): Stable Bronchodilators   History of DVT (deep venous thrombosis) (HCC) in March 2022 Used to be on elquis but according to patient he is no longer on eliquis. Records show repeat ultrasound in Dec 2022 showed resolution of DVT   Current smoker Nicotine patch   Depression with anxiety Continue home medications        Consultants: Podiatry, vascualr Procedures performed: BKA  Disposition: Home Diet recommendation:  Discharge Diet Orders (From admission, onward)     Start     Ordered   11/24/22 0000  Diet - low sodium heart healthy        11/24/22 1515           Cardiac diet DISCHARGE MEDICATION: Allergies as of 11/24/2022       Reactions   Bee Venom Anaphylaxis   Cheese Anaphylaxis, Swelling, Other (See Comments)   Blue Cheese only   Coffea Arabica Shortness Of Breath   Coffee Flavor  Shortness Of Breath        Medication List     STOP taking these medications    albuterol 108 (90 Base) MCG/ACT inhaler Commonly known as: Ventolin HFA   amLODipine 5 MG tablet Commonly known as: Norvasc   ARIPiprazole 10 MG tablet Commonly known as: ABILIFY   Breztri Aerosphere 160-9-4.8 MCG/ACT Aero Generic drug: Budeson-Glycopyrrol-Formoterol   buPROPion 150 MG 12 hr  tablet Commonly known as: WELLBUTRIN SR   doxycycline 100 MG capsule Commonly known as: VIBRAMYCIN   Eliquis 5 MG Tabs tablet Generic drug: apixaban   gentamicin ointment 0.1 % Commonly known as: GARAMYCIN   glipiZIDE 10 MG tablet Commonly known as: GLUCOTROL   magnesium oxide 400 MG tablet Commonly known as: MAG-OX   Mucinex DM 30-600 MG Tb12       TAKE these medications    acetaminophen 650 MG CR tablet Commonly known as: TYLENOL Take 1,300 mg by mouth every 8 (eight) hours as needed for pain.   aspirin EC 81 MG tablet Take 1 tablet (81 mg total) by mouth daily. Swallow whole. Start taking on: November 25, 2022   blood glucose meter kit and supplies Kit Dispense based on patient and insurance preference. Use up to four times daily as directed. (FOR ICD-9 250.00, 250.01). What changed: Another medication with the same name was removed. Continue taking this medication, and follow the directions you see here.   cloNIDine 0.1 MG tablet Commonly known as: CATAPRES Take 1 tablet (0.1 mg total) by mouth at bedtime.   clopidogrel 75 MG tablet Commonly known as: PLAVIX Take 1 tablet (75 mg total) by mouth daily. Start taking on: November 25, 2022   cyanocobalamin 1000 MCG tablet Commonly known as: VITAMIN B12 Take 1 tablet (1,000 mcg total) by mouth daily.   gabapentin 400 MG capsule Commonly known as: NEURONTIN TAKE ONE CAPSULE (400MG  TOTAL) BY MOUTH THREE TIMES DAILY.   insulin glargine 100 UNIT/ML Solostar Pen Commonly known as: LANTUS Inject 25 Units into the skin at bedtime. What changed: how much to take   metFORMIN 1000 MG tablet Commonly known as: GLUCOPHAGE TAKE ONE TABLET (1,000MG  TOTAL) BY MOUTH TWICE DAILY.   metoprolol succinate 50 MG 24 hr tablet Commonly known as: TOPROL-XL Take 50 mg by mouth daily.   mometasone-formoterol 100-5 MCG/ACT Aero Commonly known as: DULERA Inhale 2 puffs into the lungs 2 (two) times daily.   nicotine 14 mg/24hr  patch Commonly known as: NICODERM CQ - dosed in mg/24 hours Place 1 patch (14 mg total) onto the skin daily.   pantoprazole 40 MG tablet Commonly known as: PROTONIX TAKE ONE TABLET (40MG  TOTAL) BY MOUTH ONCE DAILY. What changed:  how much to take when to take this   Pen Needles 32G X 6 MM Misc 1 each by Does not apply route daily.   rosuvastatin 5 MG tablet Commonly known as: Crestor TAKE ONE TABLET (5MG  TOTAL) BY MOUTH ONCE DAILY.   venlafaxine XR 150 MG 24 hr capsule Commonly known as: Effexor XR Take 1 capsule (150 mg total) by mouth daily with breakfast. Patient to take medication with 75 mg tablet for (225 mg total). What changed: Another medication with the same name was removed. Continue taking this medication, and follow the directions you see here.               Discharge Care Instructions  (From admission, onward)           Start     Ordered  11/24/22 0000  Discharge wound care:       Comments: S/p amputation, follow with vascular   11/24/22 1515            Follow-up Information     Georgiana Spinner, NP Follow up in 3 week(s).   Specialty: Vascular Surgery Why: Post op BKA Needs Staple removal and evaluation Contact information: [address] [place] [address] [phone]         Nira Retort Follow up in 1 week(s).   Contact information: [address] [place] [address] [phone]                Discharge Exam: Ceasar Mons Weights   11/02/22 1103 11/07/22 0644  Weight: 89.4 kg 89.4 kg   General exam: Appears calm and comfortable  Respiratory system: Clear to auscultation. Respiratory effort normal. Cardiovascular system: S1 & S2 heard, RRR. No JVD, murmurs, rubs, gallops or clicks. No pedal edema. Gastrointestinal system: Abdomen is nondistended, soft and nontender. No organomegaly or masses felt. Normal bowel sounds heard. Central nervous system: Alert and oriented. No focal  neurological deficits. Extremities: Right BKA Skin: No rashes, lesions or ulcers Psychiatry: Judgement and insight appear normal. Mood & affect appropriate.    Condition at discharge: good  The results of significant diagnostics from this hospitalization (including imaging, microbiology, ancillary and laboratory) are listed below for reference.   Imaging Studies: DG Knee 3 Views Right  Result Date: 11/21/2022 CLINICAL DATA:  Recent BKA.  Fall.  Pain. EXAM: RIGHT KNEE - 3 VIEW COMPARISON:  None Available. FINDINGS: Changes of right below-the-knee amputation. No acute bony abnormality. No fracture, subluxation or dislocation. No evidence of osteomyelitis. No joint effusion. Soft tissues unremarkable. IMPRESSION: Right BKA.  No acute bony abnormality. Electronically Signed   By: Charlett Nose M.D.   On: 11/21/2022 23:40   CT HEAD CODE STROKE WO CONTRAST`  Addendum Date: 11/09/2022   ADDENDUM REPORT: 11/09/2022 16:59 ADDENDUM: Findings were discussed with Dr. Criss Alvine on 11/09/22 at 4:55 PM. Electronically Signed   By: Lorenza Cambridge M.D.   On: 11/09/2022 16:59   Result Date: 11/09/2022 CLINICAL DATA:  Code stroke. Neuro deficit, acute, stroke suspected STAT no lateralizing sign provided. EXAM: CT HEAD WITHOUT CONTRAST TECHNIQUE: Contiguous axial images were obtained from the base of the skull through the vertex without intravenous contrast. RADIATION DOSE REDUCTION: This exam was performed according to the departmental dose-optimization program which includes automated exposure control, adjustment of the mA and/or kV according to patient size and/or use of iterative reconstruction technique. COMPARISON:  None Available. FINDINGS: Limitations: Assessment is markedly limited due to the degree of streak and motion artifact. Brain: Within the limitations of significant motion degradation streak artifact, there is no definite evidence of an acute infarct. There is a vague ill-defined region of hypodensity in  the left MCA territory, which is most likely artifactual. No hemorrhage. No extra-axial fluid collection. No hydrocephalus. Vascular: Possible hyperdense left MCA versus artifact. Skull: Normal. Negative for fracture or focal lesion. Sinuses/Orbits: No middle ear or mastoid effusion. Paranasal sinuses are clear. Orbits are unremarkable. Other: None. ASPECTS ([place] Stroke Program Early CT Score): 10 IMPRESSION: 1. Assessment is markedly limited due to the degree of streak and motion artifact. Within these limitations, there is no definite evidence of an acute infarct. There is an ill-defined region of hypodensity in the left MCA territory, which is most likely artifactual. If there is clinical concern for an acute infarct, consider MRI  for further evaluation. 2. Possible hyperdense left MCA versus artifact. If there is clinical concern for an acute infarct that could be localized to the left MCA territory, consider CTA for further evaluation. Electronically Signed: By: Lorenza Cambridge M.D. On: 11/09/2022 16:50   PERIPHERAL VASCULAR CATHETERIZATION  Result Date: 11/05/2022 See surgical note for result.  MR ANKLE RIGHT WO CONTRAST  Result Date: 11/02/2022 CLINICAL DATA:  Osteomyelitis suspected. Right foot wound and cellulitis. EXAM: MRI OF THE RIGHT ANKLE WITHOUT CONTRAST TECHNIQUE: Multiplanar, multisequence MR imaging of the ankle was performed. No intravenous contrast was administered. COMPARISON:  None Available. FINDINGS: TENDONS Peroneal: Peroneal longus tendon intact. Peroneal brevis intact. Fluid tracking along the peroneus brevis and peroneus longus tendon suggesting tenosynovitis. Posteromedial: Posterior tibial tendon intact. Flexor hallucis longus tendon intact. Flexor digitorum longus tendon intact. Anterior: Tibialis anterior tendon intact. Extensor hallucis longus tendon intact Extensor digitorum longus tendon intact. Achilles: Mild edema along the distal Achilles tendon suggesting  peritenonitis. Achilles tendon is however intact. Plantar Fascia: Intact. LIGAMENTS Lateral: Anterior talofibular ligament intact. Calcaneofibular ligament intact. Posterior talofibular ligament intact. Anterior and posterior tibiofibular ligaments intact. Medial: Deltoid ligament intact. Spring ligament intact. CARTILAGE Ankle Joint: No joint effusion. Normal ankle mortise. No chondral defect. Subtalar Joints/Sinus Tarsi: Normal subtalar joints. No subtalar joint effusion. Normal sinus tarsi. Evaluation of ankle and subtalar joints is limited due to motion. Bones: Bone marrow edema of the lateral aspect of the calcaneus consistent with osteomyelitis. No evidence of fracture or dislocation. Soft Tissue: Large deep skin wound about the lateral aspect of the calcaneus measuring at least 3.5 x 1.0 x 2.4 cm. Marked soft tissue swelling about the medial and lateral aspect of the ankle. IMPRESSION: 1. Large deep skin wound about the lateral aspect of the calcaneus measuring at least 3.5 x 1.0 x 2.4 cm. 2. Bone marrow edema of the lateral aspect of the calcaneus adjacent to the above-mentioned deep skin wound consistent with osteomyelitis. 3. Fluid tracking along the peroneus brevis and peroneus longus tendon suggesting tenosynovitis. 4. Mild edema along the distal Achilles tendon suggesting peritenonitis, likely secondary to adjacent infectious/inflammatory process. 5. Evaluation of ankle and subtalar joints is limited due to motion. Electronically Signed   By: Larose Hires D.O.   On: 11/02/2022 14:38   DG Foot 2 Views Left  Result Date: 11/02/2022 CLINICAL DATA:  Diabetic foot infection.  Bilateral feet wounds. EXAM: LEFT FOOT - 2 VIEW; RIGHT FOOT - 2 VIEW COMPARISON:  None Available. FINDINGS: Right foot: There is no evidence of fracture or dislocation. No cortical erosion or periosteal reaction. Osseous deformity of the second metatarsal head, which may be sequela of prior trauma or infectious/inflammatory process.  Soft tissue swelling about the heel and lateral aspect of the ankle. Left foot: Postsurgical changes for prior amputation through the distal first metatarsal and second metatarsophalangeal joint. No cortical erosion or periosteal reaction. Soft tissue swelling about the prior amputation site. IMPRESSION: RIGHT FOOT: No cortical erosion or periosteal reaction to suggest osteomyelitis. Soft tissue swelling about the heel and lateral aspect of the ankle. LEFT FOOT: Postsurgical changes for prior amputation through the distal first metatarsal and second metatarsophalangeal joint without evidence of osteomyelitis. Electronically Signed   By: Larose Hires D.O.   On: 11/02/2022 13:26   DG Foot 2 Views Right  Result Date: 11/02/2022 CLINICAL DATA:  Diabetic foot infection.  Bilateral feet wounds. EXAM: LEFT FOOT - 2 VIEW; RIGHT FOOT - 2 VIEW COMPARISON:  None Available. FINDINGS: Right foot:  There is no evidence of fracture or dislocation. No cortical erosion or periosteal reaction. Osseous deformity of the second metatarsal head, which may be sequela of prior trauma or infectious/inflammatory process. Soft tissue swelling about the heel and lateral aspect of the ankle. Left foot: Postsurgical changes for prior amputation through the distal first metatarsal and second metatarsophalangeal joint. No cortical erosion or periosteal reaction. Soft tissue swelling about the prior amputation site. IMPRESSION: RIGHT FOOT: No cortical erosion or periosteal reaction to suggest osteomyelitis. Soft tissue swelling about the heel and lateral aspect of the ankle. LEFT FOOT: Postsurgical changes for prior amputation through the distal first metatarsal and second metatarsophalangeal joint without evidence of osteomyelitis. Electronically Signed   By: Larose Hires D.O.   On: 11/02/2022 13:26    Microbiology: Results for orders placed or performed during the hospital encounter of 11/02/22  Culture, blood (Routine x 2)     Status:  None   Collection Time: 11/02/22 11:16 AM   Specimen: BLOOD  Result Value Ref Range Status   Specimen Description BLOOD RIGHT ANTECUBITAL  Final   Special Requests   Final    BOTTLES DRAWN AEROBIC AND ANAEROBIC Blood Culture adequate volume   Culture   Final    NO GROWTH 5 DAYS Performed at [facility], [address] [place], [place] [address]    Report Status 11/07/2022 FINAL  Final  Culture, blood (Routine x 2)     Status: None   Collection Time: 11/02/22 11:21 AM   Specimen: BLOOD  Result Value Ref Range Status   Specimen Description BLOOD BLOOD RIGHT ARM  Final   Special Requests   Final    BOTTLES DRAWN AEROBIC AND ANAEROBIC Blood Culture adequate volume   Culture   Final    NO GROWTH 5 DAYS Performed at [facility], [address] [place], [place] [address]    Report Status 11/07/2022 FINAL  Final  MRSA Next Gen by PCR, Nasal     Status: None   Collection Time: 11/09/22  5:04 PM   Specimen: Nasal Mucosa; Nasal Swab  Result Value Ref Range Status   MRSA by PCR Next Gen NOT DETECTED NOT DETECTED Final    Comment: (NOTE) The GeneXpert MRSA Assay (FDA approved for NASAL specimens only), is one component of a comprehensive MRSA colonization surveillance program. It is not intended to diagnose MRSA infection nor to guide or monitor treatment for MRSA infections. Test performance is not FDA approved in patients less than 87 years old. Performed at [facility], [address] [place], [place] [address]     Labs: CBC: Recent Labs  Lab 11/22/22 0439  WBC 5.6  HGB 9.9*  HCT 31.7*  MCV 89.0  PLT 379   Basic Metabolic Panel: Recent Labs  Lab 11/22/22 0439  NA 136  K 4.1  CL 98  CO2 27  GLUCOSE 136*  BUN 20  CREATININE 0.94  CALCIUM 9.3   Liver Function Tests: No results for input(s): "AST", "ALT", "ALKPHOS", "BILITOT", "PROT", "ALBUMIN" in the last 168 hours. CBG: Recent Labs  Lab 11/23/22 1156 11/23/22 1646  11/23/22 2113 11/24/22 0824 11/24/22 1144  GLUCAP 199* 124* 188* 170* 207*    Discharge time spent: greater than 30 minutes.  Signed: Marrion Coy, MD [facility] 11/24/2022

## 2022-11-24 NOTE — TOC Transition Note (Signed)
Transition of Care [facility]) - CM/SW Discharge Note   Patient Details  Name: Timothy [name]. MRN: [id number] Date of Birth: 03/17/1961  Transition of Care [facility] CM/SW Contact:  Darolyn Rua, LCSW Phone Number: 11/24/2022, 3:43 PM   Clinical Narrative:     CSW received message from MD report patient had stated he has plan to go home now. CSW reached out ot patient, he confirms he will be going home to address listed in chart which is where he was staying prior to admission.   Patient reports he lives with 2 roommates who will be home if he needs support. Patient interested in crutches vs wheelchair at dc depending on MD input. Patient reported needing taxi voucher home.   Per MD crutches needed, CSW ordered via [employer] and they were delivered to bedside. Taxi voucher given to [employer]. [facility] [place] [facility] any [facility].   No further discharge needs identified at this time.   Final next level of care: Home/Self Care Barriers to Discharge: No Barriers Identified   Patient Goals and CMS Choice CMS Medicare.gov Compare Post Acute Care list provided to:: Patient Choice offered to / list presented to : Patient  Discharge Placement                         Discharge Plan and Services Additional resources added to the After Visit Summary for     Discharge Planning Services: CM Consult Post Acute Care Choice:  (TBD)          DME Arranged: Crutches DME Agency: [employer] Date DME Agency Contacted: 11/24/22   Representative spoke with at DME Agency: jasmine            Social Determinants of Health (SDOH) Interventions SDOH Screenings   Food Insecurity: No Food Insecurity (11/02/2022)  Housing: Low Risk  (11/02/2022)  Transportation Needs: No Transportation Needs (11/02/2022)  Utilities: Not At Risk (11/02/2022)  Alcohol Screen: Low Risk  (02/10/2020)  Depression (PHQ2-9): High Risk (10/03/2022)  Financial Resource Strain:  Medium Risk (05/28/2022)  Physical Activity: Inactive (05/28/2022)  Social Connections: Socially Isolated (05/28/2022)  Stress: Stress Concern Present (05/28/2022)  Tobacco Use: High Risk (11/08/2022)     Readmission Risk Interventions    11/06/2022   12:06 PM  Readmission Risk Prevention Plan  Transportation Screening Complete  PCP or Specialist Appt within 3-5 Days Complete  Social Work Consult for Recovery Care Planning/Counseling Complete  Palliative Care Screening Not Applicable  Medication Review Oceanographer) Referral to Pharmacy

## 2022-11-24 NOTE — Progress Notes (Signed)
Discharge instructions reviewed with patient. Patient verbalizes understanding and denies further questions at this time. Patient states his belongings were returned. [employer] cab called and state they are on route for pick up.

## 2022-11-26 ENCOUNTER — Ambulatory Visit (INDEPENDENT_AMBULATORY_CARE_PROVIDER_SITE_OTHER): Payer: Medicaid Other | Admitting: Vascular Surgery

## 2022-11-28 ENCOUNTER — Ambulatory Visit (HOSPITAL_COMMUNITY): Payer: Medicaid Other | Admitting: Licensed Clinical Social Worker

## 2022-12-05 ENCOUNTER — Ambulatory Visit (INDEPENDENT_AMBULATORY_CARE_PROVIDER_SITE_OTHER): Payer: Medicaid Other | Admitting: Licensed Clinical Social Worker

## 2022-12-05 DIAGNOSIS — F319 Bipolar disorder, unspecified: Secondary | ICD-10-CM | POA: Diagnosis not present

## 2022-12-05 NOTE — Progress Notes (Signed)
THERAPIST PROGRESS NOTE  Session Time: 77  Participation Level: Active  Behavioral Response: CasualAlertAnxious and Depressed  Type of Therapy: Individual Therapy  Treatment Goals addressed:  Active     BH CCP Acute or Chronic Trauma Reaction     LTG: Recall traumatic events without becoming overwhelmed with negative emotions (Progressing)     Start:  05/28/22    Expected End:  01/16/23         STG: Timothy [name] will attend at least 80% of scheduled follow-up medication management appointments (Progressing)     Start:  05/28/22    Expected End:  01/16/23         STG: Timothy [name] will cooperate with and complete psychological testing to help evaluate trauma symptoms (Completed/Met)     Start:  05/28/22    Expected End:  01/16/23    Resolved:  12/05/22    Goal Note     Patient maintaining therapy sessions and comprehensive clinical assessments.         STG: Timothy [name] will identify internal and external stimuli that trigger PTSD symptoms (Progressing)     Start:  05/28/22    Expected End:  01/16/23         STG: Timothy [name] will acknowledge that healing from PTSD is a gradual process (Progressing)     Start:  05/28/22    Expected End:  01/16/23         STG: Timothy [name] will identify negative coping strategies that have been used to cope with the feelings associated with the trauma (Progressing)     Start:  05/28/22    Expected End:  01/16/23       Goal Note     Alcohol         STG: Timothy [name] will identify coping strategies to deal with trauma memories and the associated emotional reaction (Progressing)     Start:  05/28/22    Expected End:  01/16/23           BH CCP BIPOLAR DISORDER-MANIA/HYPOMANIA     STG: Timothy [name] will cooperate with a psychiatric evaluation on 08/20/22 and during all follow-up visits (Completed/Met)     Start:  05/28/22    Expected End:  01/16/23    Resolved:  09/05/22      STG: Timothy [name] will identify cognitive patterns and beliefs that interfere with therapy  (Progressing)     Start:  05/28/22    Expected End:  01/16/23       Goal Note     1.Timothy [name]         LTG: Decrease PHQ-9 below 10  (Not Progressing)     Start:  05/28/22    Expected End:  01/16/23       Goal Note     Increased 5 points: Pt reports this is due to SSDI pending and wound/ulcer on foot.          STG: Decrease GAD-7 below 5  (Progressing)     Start:  05/28/22    Expected End:  01/16/23         Work with Timothy [name] to discuss risks and benefits of medication treatment options for this problem and prescribe as indicated      Start:  05/28/22       Intervention Note     Reviewed with patient during session.             ProgressTowards Goals: Progressing  Interventions: CBT and Motivational Interviewing  Summary: Timothy [name]. is a  62 y.o. male who presents with depressed and anxious mood\affect.  Patient was pleasant, cooperative, maintained good eye contact.  He engaged well in therapy session was dressed casually.  Timothy [name] was alert and oriented x 5.  Patient comes in with primary stressors for housing, financials, and illness.  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  Patient patient reports while in the hospital he was denied for Social Security disability income due to missing a phone call or follow-up appointment with Social Security administration office.  Patient does report that he was approved for Social Security income.  Patient reports that he will not be receiving $6700 monthly.  Patient reports that the checks are currently on hold due further documentation needed for bank account information.  Patient reports that he should have gone to a skilled nursing facility or acute rehab facility after his amputation but due to his sex offender status was unable to get into any facility.  Patient reports that he has having conflict with his roommates due  to a dispute with his dog.  Patient reports that he has a puppy that will get into "everything".  Patient reports his primary goals are to find independent housing, receiving monthly income, and rehab for amputation.  Suicidal/Homicidal: Nowithout intent/plan  Therapist Response:    Intervention\plan: LCSW provided patient housing resources for [employer], [place], and [place] housing coalition.  LCSW provided patient resources to [employer] administration building address.  LCSW educated patient on home health care or physical therapy, Occupational Therapy, and skilled nursing.  LCSW also recommended to patient to utilize outpatient physical and occupational therapy services while utilizing Medicaid cab for transportation.  LCSW supportive therapy for praise and encouragement.  LCSW used motivational interviewing for positive affirmations, reflective listening, and open-ended questions.   Plan: Return again in 5 weeks.  Diagnosis: Bipolar disorder with psychotic features (HCC)  Collaboration of Care: Other None today   Patient/Guardian was advised Release of Information must be obtained prior to any record release in order to collaborate their care with an outside provider. Patient/Guardian was advised if they have not already done so to contact the registration department to sign all necessary forms in order for [place] to release information regarding their care.   Consent: Patient/Guardian gives verbal consent for treatment and assignment of benefits for services provided during this visit. Patient/Guardian expressed understanding and agreed to proceed.   Weber Cooks, LCSW 12/05/2022

## 2022-12-10 ENCOUNTER — Other Ambulatory Visit (INDEPENDENT_AMBULATORY_CARE_PROVIDER_SITE_OTHER): Payer: Self-pay | Admitting: Nurse Practitioner

## 2022-12-10 ENCOUNTER — Ambulatory Visit (INDEPENDENT_AMBULATORY_CARE_PROVIDER_SITE_OTHER): Payer: Medicaid Other | Admitting: Nurse Practitioner

## 2022-12-10 VITALS — BP 100/70 | HR 101 | Resp 19

## 2022-12-10 DIAGNOSIS — T8743 Infection of amputation stump, right lower extremity: Secondary | ICD-10-CM

## 2022-12-10 MED ORDER — SULFAMETHOXAZOLE-TRIMETHOPRIM 800-160 MG PO TABS: 1.0000 | ORAL_TABLET | Freq: Two times a day (BID) | ORAL | 0 refills | Status: DC

## 2022-12-11 ENCOUNTER — Encounter (INDEPENDENT_AMBULATORY_CARE_PROVIDER_SITE_OTHER): Payer: Self-pay | Admitting: Nurse Practitioner

## 2022-12-11 NOTE — Progress Notes (Signed)
Subjective:    Patient ID: Timothy [name]., male    DOB: 1961-05-30, 62 y.o.   MRN: [id number] Chief Complaint  Patient presents with   Venous Insufficiency    Timothy [name] is a 62 year old male who presents today for evaluation of his right below-knee amputation.  The amputation took place on 11/07/2022.  Since that time the patient has had some difficulties with his stomach.  Today the medial area has some significant slough and eschar but appears to have some underlying dehiscence.  The lateral portion has some purulent drainage coming from the wound itself.  There is a foul-smelling odor associated.  The patient does endorse that he has recently followed in his residual limb.    Review of Systems  Cardiovascular:  Positive for leg swelling.  Skin:  Positive for wound.  Neurological:  Positive for weakness.  All other systems reviewed and are negative.      Objective:   Physical Exam Vitals reviewed.  HENT:     Head: Normocephalic.  Cardiovascular:     Rate and Rhythm: Normal rate.  Musculoskeletal:     Right lower leg: Edema present.  Skin:    General: Skin is warm and dry.  Neurological:     Mental Status: He is alert and oriented to person, place, and time.  Psychiatric:        Mood and Affect: Mood normal.        Behavior: Behavior normal.        Thought Content: Thought content normal.        Judgment: Judgment normal.     BP 100/70 (BP Location: Left Arm)   Pulse (!) 101   Resp 19   Past Medical History:  Diagnosis Date   Anxiety    COPD (chronic obstructive pulmonary disease) (HCC)    Depression    Diabetes mellitus without complication (HCC)    GERD (gastroesophageal reflux disease)    Hypercholesteremia    Hypertension    Pneumonia    PTSD (post-traumatic stress disorder)     Social History   Socioeconomic History   Marital status: Single    Spouse name: Not on file   Number of children: 0   Years of education: Not on file   Highest  education level: Not on file  Occupational History   Occupation: Unemployed  Tobacco Use   Smoking status: Every Day    Packs/day: 1.00    Years: 41.00    Additional pack years: 0.00    Total pack years: 41.00    Types: Cigarettes   Smokeless tobacco: Never   Tobacco comments:    1 pack a day reported but is trying to cut back to stop  Vaping Use   Vaping Use: Never used  Substance and Sexual Activity   Alcohol use: Not Currently    Alcohol/week: 6.0 standard drinks of alcohol    Types: 6 Cans of beer per week    Comment: 1-2 times weekly; 4 "40s"   Drug use: Not Currently   Sexual activity: Not Currently  Other Topics Concern   Not on file  Social History Narrative   Lives with 1 roommate   Social Determinants of Health   Financial Resource Strain: Medium Risk (05/28/2022)   Overall Financial Resource Strain (CARDIA)    Difficulty of Paying Living Expenses: Somewhat hard  Food Insecurity: No Food Insecurity (11/02/2022)   Hunger Vital Sign    Worried About Running Out of Food in the  Last Year: Never true    Ran Out of Food in the Last Year: Never true  Transportation Needs: No Transportation Needs (11/02/2022)   PRAPARE - Administrator, Civil Service (Medical): No    Lack of Transportation (Non-Medical): No  Physical Activity: Inactive (05/28/2022)   Exercise Vital Sign    Days of Exercise per Week: 0 days    Minutes of Exercise per Session: 0 min  Stress: Stress Concern Present (05/28/2022)   [employer] of Occupational Health - Occupational Stress Questionnaire    Feeling of Stress : Very much  Social Connections: Socially Isolated (05/28/2022)   Social Connection and Isolation Panel [NHANES]    Frequency of Communication with Friends and Family: Twice a week    Frequency of Social Gatherings with Friends and Family: Once a week    Attends Religious Services: Never    Database administrator or Organizations: No    Attends Banker  Meetings: Never    Marital Status: Widowed  Intimate Partner Violence: Not At Risk (11/02/2022)   Humiliation, Afraid, Rape, and Kick questionnaire    Fear of Current or Ex-Partner: No    Emotionally Abused: No    Physically Abused: No    Sexually Abused: No    Past Surgical History:  Procedure Laterality Date   AMPUTATION Left 03/22/2020   Procedure: AMPUTATION RAY-1st Ray Left Foot;  Surgeon: Linus Galas, [name];  Location: [facility];  Service: Podiatry;  Laterality: Left;   AMPUTATION Right 11/07/2022   Procedure: AMPUTATION BELOW KNEE;  Surgeon: Annice Needy, MD;  Location: [facility];  Service: Vascular;  Laterality: Right;   AMPUTATION TOE Left 03/16/2020   Procedure: AMPUTATION GREAT TOE;  Surgeon: Linus Galas, [name];  Location: [facility];  Service: Podiatry;  Laterality: Left;   AMPUTATION TOE Left 04/06/2022   Procedure: AMPUTATION TOE;  Surgeon: Linus Galas, [name];  Location: [facility];  Service: Podiatry;  Laterality: Left;   COLONOSCOPY WITH PROPOFOL N/A 12/22/2019   Procedure: COLONOSCOPY WITH PROPOFOL;  Surgeon: Midge Minium, MD;  Location: [facility];  Service: Endoscopy;  Laterality: N/A;   INCISION AND DRAINAGE ABSCESS Left 08/11/2021   Procedure: INCISION AND DRAINAGE ABSCESS;  Surgeon: Linus Salmons, MD;  Location: [facility];  Service: ENT;  Laterality: Left;   LOWER EXTREMITY ANGIOGRAPHY Left 03/18/2020   Procedure: Lower Extremity Angiography;  Surgeon: Renford Dills, MD;  Location: [facility] IN[facility]ASIVE CV [facility];  Service: Cardiovascular;  Laterality: Left;   LOWER EXTREMITY ANGIOGRAPHY Right 11/05/2022   Procedure: Lower Extremity Angiography;  Surgeon: Annice Needy, MD;  Location: [facility] IN[facility]AS[facility] CV [facility];  Service: Cardiovascular;  Laterality: Right;   TONSILLECTOMY     VIDEO ASSISTED THORACOSCOPY (VATS)/DECORTICATION Left 08/15/2020   Procedure: VIDEO ASSISTED THORACOSCOPY (VATS)/DECORTICATION;  Surgeon: Corliss Skains, MD;  Location: [facility] OR;  Service: Thoracic;  Laterality: Left;    WISDOM TOOTH EXTRACTION      Family History  Family history unknown: Yes    Allergies  Allergen Reactions   Bee Venom Anaphylaxis   Cheese Anaphylaxis, Swelling and Other (See Comments)    Blue Cheese only   Coffea Arabica Shortness Of Breath   Coffee Flavor Shortness Of Breath       Latest Ref Rng & Units 11/22/2022    4:39 AM 11/14/2022    7:17 AM 11/13/2022    5:03 AM  CBC  WBC 4.0 - 10.5 K/uL 5.6  7.9  8.0   Hemoglobin 13.0 - 17.0 g/dL  9.9  10.2  10.0   Hematocrit 39.0 - 52.0 % 31.7  32.4  32.3   Platelets 150 - 400 K/uL 379  614  608       CMP     Component Value Date/Time   NA 136 11/22/2022 0439   NA 134 06/22/2020 1058   K 4.1 11/22/2022 0439   CL 98 11/22/2022 0439   CO2 27 11/22/2022 0439   GLUCOSE 136 (H) 11/22/2022 0439   BUN 20 11/22/2022 0439   BUN 8 06/22/2020 1058   CREATININE 0.94 11/22/2022 0439   CALCIUM 9.3 11/22/2022 0439   PROT 8.2 (H) 11/09/2022 1649   PROT 7.3 06/22/2020 1058   ALBUMIN 2.7 (L) 11/09/2022 1649   ALBUMIN 4.3 06/22/2020 1058   AST 30 11/09/2022 1649   ALT 18 11/09/2022 1649   ALKPHOS 302 (H) 11/09/2022 1649   BILITOT 0.5 11/09/2022 1649   BILITOT 0.3 06/22/2020 1058   GFRNONAA >60 11/22/2022 0439     No results found.     Assessment & Plan:   1. Right BKA infection (HCC) Today culture was taken from the wound.  All staples were removed.  I suspect that there will be some dehiscence noted at the medial portion.  We have applied Medihoney to all the areas of slough and eschar.  The patient is instructed to change this daily.  He is also instructed about the utmost importance of not following on his residual limb.  Repeated trauma to the area will likely result in further amputation.  We will also send in antibiotics for his wound.  Will plan on having him return in 2 weeks or sooner if there are any issues with his wound healing.    Current Outpatient Medications on File Prior to Visit  Medication Sig Dispense  Refill   acetaminophen (TYLENOL) 650 MG CR tablet Take 1,300 mg by mouth every 8 (eight) hours as needed for pain.     blood glucose meter kit and supplies KIT Dispense based on patient and insurance preference. Use up to four times daily as directed. (FOR ICD-9 250.00, 250.01). 1 each 0   cyanocobalamin (VITAMIN B12) 1000 MCG tablet Take 1 tablet (1,000 mcg total) by mouth daily. 30 tablet 0   gabapentin (NEURONTIN) 400 MG capsule TAKE ONE CAPSULE (400MG  TOTAL) BY MOUTH THREE TIMES DAILY. 90 capsule 1   insulin glargine (LANTUS) 100 UNIT/ML Solostar Pen Inject 25 Units into the skin at bedtime. 15 mL 0   Insulin Pen Needle (PEN NEEDLES) 32G X 6 MM MISC 1 each by Does not apply route daily. 30 each 0   metFORMIN (GLUCOPHAGE) 1000 MG tablet TAKE ONE TABLET (1,000MG  TOTAL) BY MOUTH TWICE DAILY. 60 tablet 1   metoprolol succinate (TOPROL-XL) 50 MG 24 hr tablet Take 50 mg by mouth daily.     mometasone-formoterol (DULERA) 100-5 MCG/ACT AERO Inhale 2 puffs into the lungs 2 (two) times daily. 1 each 0   pantoprazole (PROTONIX) 40 MG tablet TAKE ONE TABLET (40MG  TOTAL) BY MOUTH ONCE DAILY. (Patient taking differently: Take 40 mg by mouth daily.) 30 tablet 1   rosuvastatin (CRESTOR) 5 MG tablet TAKE ONE TABLET (5MG  TOTAL) BY MOUTH ONCE DAILY. 30 tablet 0   aspirin EC 81 MG tablet Take 1 tablet (81 mg total) by mouth daily. Swallow whole. (Patient not taking: Reported on 12/10/2022) 30 tablet 0   cloNIDine (CATAPRES) 0.1 MG tablet Take 1 tablet (0.1 mg total) by mouth at bedtime. (Patient not taking:  Reported on 11/08/2022) 30 tablet 2   clopidogrel (PLAVIX) 75 MG tablet Take 1 tablet (75 mg total) by mouth daily. (Patient not taking: Reported on 12/10/2022) 30 tablet 0   nicotine (NICODERM CQ - DOSED IN MG/24 HOURS) 14 mg/24hr patch Place 1 patch (14 mg total) onto the skin daily. (Patient not taking: Reported on 12/10/2022) 30 patch 0   venlafaxine XR (EFFEXOR XR) 150 MG 24 hr capsule Take 1 capsule (150 mg  total) by mouth daily with breakfast. Patient to take medication with 75 mg tablet for (225 mg total). 30 capsule 2   [DISCONTINUED] citalopram (CELEXA) 20 MG tablet Take 1 tablet (20 mg total) by mouth daily. (Patient taking differently: Take 20 mg by mouth at bedtime.) 30 tablet 0   No current facility-administered medications on file prior to visit.    There are no Patient Instructions on file for this visit. No follow-ups on file.   Georgiana Spinner, NP

## 2022-12-13 LAB — AEROBIC CULTURE

## 2022-12-14 ENCOUNTER — Encounter: Payer: Self-pay | Admitting: Pulmonary Disease

## 2022-12-14 LAB — AEROBIC CULTURE

## 2022-12-28 ENCOUNTER — Ambulatory Visit (INDEPENDENT_AMBULATORY_CARE_PROVIDER_SITE_OTHER): Payer: Medicaid Other | Admitting: Nurse Practitioner

## 2023-01-01 ENCOUNTER — Ambulatory Visit
Admission: RE | Admit: 2023-01-01 | Discharge: 2023-01-01 | Disposition: A | Discharge status: Home / Self Care | Payer: MEDICAID | Source: Ambulatory Visit | Attending: Acute Care | Admitting: Acute Care

## 2023-01-01 DIAGNOSIS — Z122 Encounter for screening for malignant neoplasm of respiratory organs: Secondary | ICD-10-CM | POA: Diagnosis present

## 2023-01-01 DIAGNOSIS — J439 Emphysema, unspecified: Secondary | ICD-10-CM | POA: Diagnosis not present

## 2023-01-01 DIAGNOSIS — F17210 Nicotine dependence, cigarettes, uncomplicated: Secondary | ICD-10-CM | POA: Diagnosis not present

## 2023-01-01 DIAGNOSIS — Z87891 Personal history of nicotine dependence: Secondary | ICD-10-CM | POA: Diagnosis present

## 2023-01-01 DIAGNOSIS — I2510 Atherosclerotic heart disease of native coronary artery without angina pectoris: Secondary | ICD-10-CM | POA: Diagnosis not present

## 2023-01-01 DIAGNOSIS — I700 Atherosclerosis of aorta: Secondary | ICD-10-CM | POA: Diagnosis not present

## 2023-01-04 ENCOUNTER — Ambulatory Visit (INDEPENDENT_AMBULATORY_CARE_PROVIDER_SITE_OTHER): Payer: MEDICAID | Admitting: Nurse Practitioner

## 2023-01-04 ENCOUNTER — Encounter (INDEPENDENT_AMBULATORY_CARE_PROVIDER_SITE_OTHER): Payer: Self-pay | Admitting: Nurse Practitioner

## 2023-01-04 VITALS — BP 121/76 | HR 80 | Resp 16

## 2023-01-04 DIAGNOSIS — F332 Major depressive disorder, recurrent severe without psychotic features: Secondary | ICD-10-CM

## 2023-01-04 DIAGNOSIS — T8743 Infection of amputation stump, right lower extremity: Secondary | ICD-10-CM

## 2023-01-04 MED ORDER — VENLAFAXINE HCL ER 150 MG PO CP24
150.0000 mg | ORAL_CAPSULE | Freq: Every day | ORAL | 2 refills | Status: DC
Start: 2023-01-04 — End: 2023-10-16

## 2023-01-04 MED ORDER — GABAPENTIN 800 MG PO TABS
800.0000 mg | ORAL_TABLET | Freq: Three times a day (TID) | ORAL | 2 refills | Status: DC
Start: 2023-01-04 — End: 2023-05-10

## 2023-01-04 MED ORDER — VENLAFAXINE HCL ER 75 MG PO CP24: 75.0000 mg | ORAL_CAPSULE | Freq: Every day | ORAL | 1 refills | Status: DC

## 2023-01-04 MED ORDER — SULFAMETHOXAZOLE-TRIMETHOPRIM 800-160 MG PO TABS
1.0000 | ORAL_TABLET | Freq: Two times a day (BID) | ORAL | 0 refills | Status: DC
Start: 2023-01-04 — End: 2023-10-16

## 2023-01-04 NOTE — Progress Notes (Unsigned)
Subjective:    Patient ID: Timothy Sill., male    DOB: June 17, 1961, 62 y.o.   MRN: [id number] Chief Complaint  Patient presents with  . Follow-up    2 week follow up    HPI  Review of Systems     Objective:   Physical Exam    BP 121/76 (BP Location: Left Arm)   Pulse 80   Resp 16   Past Medical History:  Diagnosis Date  . Anxiety   . COPD (chronic obstructive pulmonary disease) (HCC)   . Depression   . Diabetes mellitus without complication (HCC)   . GERD (gastroesophageal reflux disease)   . Hypercholesteremia   . Hypertension   . Pneumonia   . PTSD (post-traumatic stress disorder)     Social History   Socioeconomic History  . Marital status: Single    Spouse name: Not on file  . Number of children: 0  . Years of education: Not on file  . Highest education level: Not on file  Occupational History  . Occupation: Unemployed  Tobacco Use  . Smoking status: Every Day    Current packs/day: 1.00    Average packs/day: 1 pack/day for 41.0 years (41.0 ttl pk-yrs)    Types: Cigarettes  . Smokeless tobacco: Never  . Tobacco comments:    1 pack a day reported but is trying to cut back to stop  Vaping Use  . Vaping status: Never Used  Substance and Sexual Activity  . Alcohol use: Not Currently    Alcohol/week: 6.0 standard drinks of alcohol    Types: 6 Cans of beer per week    Comment: 1-2 times weekly; 4 "40s"  . Drug use: Not Currently  . Sexual activity: Not Currently  Other Topics Concern  . Not on file  Social History Narrative   Lives with 1 roommate   Social Determinants of Health   Financial Resource Strain: Medium Risk (05/28/2022)   Overall Financial Resource Strain (CARDIA)   . Difficulty of Paying Living Expenses: Somewhat hard  Food Insecurity: No Food Insecurity (11/02/2022)   Hunger Vital Sign   . Worried About Programme researcher, broadcasting/film/video in the Last Year: Never true   . Ran Out of Food in the Last Year: Never true  Transportation Needs:  No Transportation Needs (11/02/2022)   PRAPARE - Transportation   . Lack of Transportation (Medical): No   . Lack of Transportation (Non-Medical): No  Physical Activity: Inactive (05/28/2022)   Exercise Vital Sign   . Days of Exercise per Week: 0 days   . Minutes of Exercise per Session: 0 min  Stress: Stress Concern Present (05/28/2022)   [employer] of Occupational Health - Occupational Stress Questionnaire   . Feeling of Stress : Very much  Social Connections: Unknown (10/11/2022)   Received from [facility]   Social Network   . Social Network: Not on file  Intimate Partner Violence: Not At Risk (11/02/2022)   Humiliation, Afraid, Rape, and Kick questionnaire   . Fear of Current or Ex-Partner: No   . Emotionally Abused: No   . Physically Abused: No   . Sexually Abused: No    Past Surgical History:  Procedure Laterality Date  . AMPUTATION Left 03/22/2020   Procedure: AMPUTATION RAY-1st Ray Left Foot;  Surgeon: Linus Galas, [name];  Location: [facility] ORS;  Service: Podiatry;  Laterality: Left;  . AMPUTATION Right 11/07/2022   Procedure: AMPUTATION BELOW KNEE;  Surgeon: Annice Needy, MD;  Location: [facility]  ORS;  Service: Vascular;  Laterality: Right;  . AMPUTATION TOE Left 03/16/2020   Procedure: AMPUTATION GREAT TOE;  Surgeon: Linus Galas, [name];  Location: [facility] ORS;  Service: Podiatry;  Laterality: Left;  . AMPUTATION TOE Left 04/06/2022   Procedure: AMPUTATION TOE;  Surgeon: Linus Galas, [name];  Location: [facility] ORS;  Service: Podiatry;  Laterality: Left;  . COLONOSCOPY WITH PROPOFOL N/A 12/22/2019   Procedure: COLONOSCOPY WITH PROPOFOL;  Surgeon: Midge Minium, MD;  Location: [facility] ENDOSCOPY;  Service: Endoscopy;  Laterality: N/A;  . INCISION AND DRAINAGE ABSCESS Left 08/11/2021   Procedure: INCISION AND DRAINAGE ABSCESS;  Surgeon: Linus Salmons, MD;  Location: [facility] ORS;  Service: ENT;  Laterality: Left;  . LOWER EXTREMITY ANGIOGRAPHY Left 03/18/2020   Procedure: Lower Extremity  Angiography;  Surgeon: Renford Dills, MD;  Location: [facility] INVASIVE CV LAB;  Service: Cardiovascular;  Laterality: Left;  . LOWER EXTREMITY ANGIOGRAPHY Right 11/05/2022   Procedure: Lower Extremity Angiography;  Surgeon: Annice Needy, MD;  Location: [facility] INVASIVE CV LAB;  Service: Cardiovascular;  Laterality: Right;  . TONSILLECTOMY    . VIDEO ASSISTED THORACOSCOPY (VATS)/DECORTICATION Left 08/15/2020   Procedure: VIDEO ASSISTED THORACOSCOPY (VATS)/DECORTICATION;  Surgeon: Corliss Skains, MD;  Location: [facility] OR;  Service: Thoracic;  Laterality: Left;  . WISDOM TOOTH EXTRACTION      Family History  Family history unknown: Yes    Allergies  Allergen Reactions  . Bee Venom Anaphylaxis  . Cheese Anaphylaxis, Swelling and Other (See Comments)    Blue Cheese only  . Coffea Arabica Shortness Of Breath  . Coffee Flavor Shortness Of Breath       Latest Ref Rng & Units 11/22/2022    4:39 AM 11/14/2022    7:17 AM 11/13/2022    5:03 AM  CBC  WBC 4.0 - 10.5 K/uL 5.6  7.9  8.0   Hemoglobin 13.0 - 17.0 g/dL 9.9  32.4  40.1   Hematocrit 39.0 - 52.0 % 31.7  32.4  32.3   Platelets 150 - 400 K/uL 379  614  608       CMP     Component Value Date/Time   NA 136 11/22/2022 0439   NA 134 06/22/2020 1058   K 4.1 11/22/2022 0439   CL 98 11/22/2022 0439   CO2 27 11/22/2022 0439   GLUCOSE 136 (H) 11/22/2022 0439   BUN 20 11/22/2022 0439   BUN 8 06/22/2020 1058   CREATININE 0.94 11/22/2022 0439   CALCIUM 9.3 11/22/2022 0439   PROT 8.2 (H) 11/09/2022 1649   PROT 7.3 06/22/2020 1058   ALBUMIN 2.7 (L) 11/09/2022 1649   ALBUMIN 4.3 06/22/2020 1058   AST 30 11/09/2022 1649   ALT 18 11/09/2022 1649   ALKPHOS 302 (H) 11/09/2022 1649   BILITOT 0.5 11/09/2022 1649   BILITOT 0.3 06/22/2020 1058   GFRNONAA >60 11/22/2022 0439     No results found.     Assessment & Plan:   1. Post-traumatic stress disorder, unspecified *** - venlafaxine XR (EFFEXOR XR) 150 MG 24 hr capsule; Take 1  capsule (150 mg total) by mouth daily with breakfast. Patient to take medication with 75 mg tablet for (225 mg total).  Dispense: 30 capsule; Refill: 2  2. MDD (major depressive disorder), recurrent severe, without psychosis (HCC) *** - venlafaxine XR (EFFEXOR XR) 150 MG 24 hr capsule; Take 1 capsule (150 mg total) by mouth daily with breakfast. Patient to take medication with 75 mg tablet for (225 mg total).  Dispense:  30 capsule; Refill: 2  3. Generalized anxiety disorder *** - venlafaxine XR (EFFEXOR XR) 150 MG 24 hr capsule; Take 1 capsule (150 mg total) by mouth daily with breakfast. Patient to take medication with 75 mg tablet for (225 mg total).  Dispense: 30 capsule; Refill: 2   Current Outpatient Medications on File Prior to Visit  Medication Sig Dispense Refill  . acetaminophen (TYLENOL) 650 MG CR tablet Take 1,300 mg by mouth every 8 (eight) hours as needed for pain.    Marland Kitchen apixaban (ELIQUIS) 5 MG TABS tablet Take 5 mg by mouth 2 (two) times daily.    . blood glucose meter kit and supplies KIT Dispense based on patient and insurance preference. Use up to four times daily as directed. (FOR ICD-9 250.00, 250.01). 1 each 0  . cyanocobalamin (VITAMIN B12) 1000 MCG tablet Take 1 tablet (1,000 mcg total) by mouth daily. 30 tablet 0  . insulin glargine (LANTUS) 100 UNIT/ML Solostar Pen Inject 25 Units into the skin at bedtime. 15 mL 0  . Insulin Pen Needle (PEN NEEDLES) 32G X 6 MM MISC 1 each by Does not apply route daily. 30 each 0  . metFORMIN (GLUCOPHAGE) 1000 MG tablet TAKE ONE TABLET (1,000MG  TOTAL) BY MOUTH TWICE DAILY. 60 tablet 1  . metoprolol succinate (TOPROL-XL) 50 MG 24 hr tablet Take 50 mg by mouth daily.    . mometasone-formoterol (DULERA) 100-5 MCG/ACT AERO Inhale 2 puffs into the lungs 2 (two) times daily. 1 each 0  . pantoprazole (PROTONIX) 40 MG tablet TAKE ONE TABLET (40MG  TOTAL) BY MOUTH ONCE DAILY. (Patient taking differently: Take 40 mg by mouth daily.) 30 tablet 1  .  rosuvastatin (CRESTOR) 5 MG tablet TAKE ONE TABLET (5MG  TOTAL) BY MOUTH ONCE DAILY. 30 tablet 0  . aspirin EC 81 MG tablet Take 1 tablet (81 mg total) by mouth daily. Swallow whole. (Patient not taking: Reported on 12/10/2022) 30 tablet 0  . cloNIDine (CATAPRES) 0.1 MG tablet Take 1 tablet (0.1 mg total) by mouth at bedtime. (Patient not taking: Reported on 11/08/2022) 30 tablet 2  . clopidogrel (PLAVIX) 75 MG tablet Take 1 tablet (75 mg total) by mouth daily. (Patient not taking: Reported on 12/10/2022) 30 tablet 0  . [DISCONTINUED] citalopram (CELEXA) 20 MG tablet Take 1 tablet (20 mg total) by mouth daily. (Patient taking differently: Take 20 mg by mouth at bedtime.) 30 tablet 0   No current facility-administered medications on file prior to visit.    There are no Patient Instructions on file for this visit. No follow-ups on file.   Georgiana Spinner, NP

## 2023-01-07 ENCOUNTER — Other Ambulatory Visit: Payer: Self-pay

## 2023-01-07 DIAGNOSIS — F17210 Nicotine dependence, cigarettes, uncomplicated: Secondary | ICD-10-CM

## 2023-01-07 DIAGNOSIS — Z87891 Personal history of nicotine dependence: Secondary | ICD-10-CM

## 2023-01-09 ENCOUNTER — Ambulatory Visit (HOSPITAL_COMMUNITY): Payer: MEDICAID | Admitting: Licensed Clinical Social Worker

## 2023-01-16 NOTE — Telephone Encounter (Signed)
Open in error

## 2023-01-18 ENCOUNTER — Ambulatory Visit (INDEPENDENT_AMBULATORY_CARE_PROVIDER_SITE_OTHER): Payer: MEDICAID | Admitting: Nurse Practitioner

## 2023-02-06 ENCOUNTER — Ambulatory Visit (HOSPITAL_COMMUNITY): Payer: MEDICAID | Admitting: Licensed Clinical Social Worker

## 2023-02-08 ENCOUNTER — Encounter: Payer: Self-pay | Admitting: Pulmonary Disease

## 2023-03-07 ENCOUNTER — Ambulatory Visit: Payer: Medicaid Other | Admitting: Pulmonary Disease

## 2023-05-07 ENCOUNTER — Other Ambulatory Visit (INDEPENDENT_AMBULATORY_CARE_PROVIDER_SITE_OTHER): Payer: Self-pay | Admitting: Nurse Practitioner

## 2023-05-07 DIAGNOSIS — T8743 Infection of amputation stump, right lower extremity: Secondary | ICD-10-CM

## 2023-06-04 ENCOUNTER — Other Ambulatory Visit (INDEPENDENT_AMBULATORY_CARE_PROVIDER_SITE_OTHER): Payer: Self-pay | Admitting: Nurse Practitioner

## 2023-06-04 NOTE — Telephone Encounter (Signed)
We will not send refill for this.  This was a one-time refill until the patient was able to establish with his PCP and psychologist

## 2023-06-05 NOTE — Telephone Encounter (Signed)
Patient should continue to get this filled by his PCP

## 2023-06-22 ENCOUNTER — Other Ambulatory Visit (INDEPENDENT_AMBULATORY_CARE_PROVIDER_SITE_OTHER): Payer: Self-pay | Admitting: Nurse Practitioner

## 2023-06-24 NOTE — Telephone Encounter (Signed)
 This was a one time fill. Patient should call and get a refill from his PCP.

## 2023-06-27 ENCOUNTER — Other Ambulatory Visit (INDEPENDENT_AMBULATORY_CARE_PROVIDER_SITE_OTHER): Payer: Self-pay | Admitting: Vascular Surgery

## 2023-06-27 DIAGNOSIS — Z89511 Acquired absence of right leg below knee: Secondary | ICD-10-CM

## 2023-06-27 DIAGNOSIS — I739 Peripheral vascular disease, unspecified: Secondary | ICD-10-CM

## 2023-06-29 NOTE — Progress Notes (Deleted)
 MRN : [id number]  Timothy [name]. is a 63 y.o. (1960/11/28) male who presents with chief complaint of check circulation.  History of Present Illness:   The patient returns to the office for followup regarding atherosclerotic changes of the lower extremities and review of the noninvasive studies.   There have been no interval changes in lower extremity symptoms. No interval shortening of the patient's claudication distance or development of rest pain symptoms. No new ulcers or wounds have occurred since the last visit.  There have been no significant changes to the patient's overall health care.  The patient denies amaurosis fugax or recent TIA symptoms. There are no documented recent neurological changes noted. There is no history of DVT, PE or superficial thrombophlebitis. The patient denies recent episodes of angina or shortness of breath.   ABI Rt=*** and Lt=***  (previous ABI's Rt=*** and Lt=***) Duplex ultrasound of the ***   No outpatient medications have been marked as taking for the 07/01/23 encounter (Appointment) with Jama, Cordella MATSU, MD.    Past Medical History:  Diagnosis Date   Anxiety    COPD (chronic obstructive pulmonary disease) (HCC)    Depression    Diabetes mellitus without complication (HCC)    GERD (gastroesophageal reflux disease)    Hypercholesteremia    Hypertension    Pneumonia    PTSD (post-traumatic stress disorder)     Past Surgical History:  Procedure Laterality Date   AMPUTATION Left 03/22/2020   Procedure: AMPUTATION RAY-1st Ray Left Foot;  Surgeon: Neill Boas, [name];  Location: [facility] ORS;  Service: Podiatry;  Laterality: Left;   AMPUTATION Right 11/07/2022   Procedure: AMPUTATION BELOW KNEE;  Surgeon: Marea Selinda RAMAN, MD;  Location: [facility] ORS;  Service: Vascular;  Laterality: Right;   AMPUTATION TOE Left 03/16/2020   Procedure: AMPUTATION GREAT TOE;  Surgeon: Neill Boas, [name];  Location: [facility] ORS;  Service: Podiatry;  Laterality: Left;   AMPUTATION TOE Left 04/06/2022   Procedure: AMPUTATION TOE;  Surgeon: Neill Boas, [name];  Location: [facility] ORS;  Service: Podiatry;  Laterality: Left;   COLONOSCOPY WITH PROPOFOL  N/A 12/22/2019   Procedure: COLONOSCOPY WITH PROPOFOL ;  Surgeon: Jinny Carmine, MD;  Location: [facility] ENDOSCOPY;  Service: Endoscopy;  Laterality: N/A;   INCISION AND DRAINAGE ABSCESS Left 08/11/2021   Procedure: INCISION AND DRAINAGE ABSCESS;  Surgeon: Herminio Miu, MD;  Location: [facility] ORS;  Service: ENT;  Laterality: Left;   LOWER EXTREMITY ANGIOGRAPHY Left 03/18/2020   Procedure: Lower Extremity Angiography;  Surgeon: Jama Cordella MATSU, MD;  Location: [facility] INVASIVE CV LAB;  Service: Cardiovascular;  Laterality: Left;   LOWER EXTREMITY ANGIOGRAPHY Right 11/05/2022   Procedure: Lower Extremity Angiography;  Surgeon: Marea Selinda RAMAN, MD;  Location: [facility] INVASIVE CV LAB;  Service: Cardiovascular;  Laterality: Right;   TONSILLECTOMY     VIDEO ASSISTED THORACOSCOPY (VATS)/DECORTICATION Left 08/15/2020   Procedure: VIDEO ASSISTED THORACOSCOPY (VATS)/DECORTICATION;  Surgeon: Shyrl Linnie KIDD, MD;  Location: [facility] OR;  Service: Thoracic;  Laterality: Left;   WISDOM TOOTH EXTRACTION      Social History Social History   Tobacco Use  Smoking status: Every Day    Current packs/day: 1.00    Average packs/day: 1 pack/day for 41.0 years (41.0 ttl pk-yrs)    Types: Cigarettes   Smokeless tobacco: Never   Tobacco comments:    1 pack a day reported but is trying to cut back to stop  Vaping Use   Vaping status: Never Used  Substance Use Topics   Alcohol use: Not Currently    Alcohol/week: 6.0 standard drinks of alcohol    Types: 6 Cans of beer per week    Comment: 1-2 times weekly; 4 40s   Drug use: Not Currently    Family History Family History  Family history unknown: Yes    Allergies  Allergen Reactions   Bee Venom Anaphylaxis   Cheese  Anaphylaxis, Swelling and Other (See Comments)    Blue Cheese only   Coffea Arabica Shortness Of Breath   Coffee Flavoring Agent (Non-Screening) Shortness Of Breath     REVIEW OF SYSTEMS (Negative unless checked)  Constitutional: [] Weight loss  [] Fever  [] Chills Cardiac: [] Chest pain   [] Chest pressure   [] Palpitations   [] Shortness of breath when laying flat   [] Shortness of breath with exertion. Vascular:  [x] Pain in legs with walking   [] Pain in legs at rest  [] History of DVT   [] Phlebitis   [] Swelling in legs   [] Varicose veins   [] Non-healing ulcers Pulmonary:   [] Uses home oxygen   [] Productive cough   [] Hemoptysis   [] Wheeze  [x] COPD   [] Asthma Neurologic:  [] Dizziness   [] Seizures   [] History of stroke   [] History of TIA  [] Aphasia   [] Vissual changes   [] Weakness or numbness in arm   [] Weakness or numbness in leg Musculoskeletal:   [] Joint swelling   [x] Joint pain   [] Low back pain Hematologic:  [] Easy bruising  [] Easy bleeding   [] Hypercoagulable state   [x] Anemic Gastrointestinal:  [] Diarrhea   [] Vomiting  [] Gastroesophageal reflux/heartburn   [] Difficulty swallowing. Genitourinary:  [] Chronic kidney disease   [] Difficult urination  [] Frequent urination   [] Blood in urine Skin:  [] Rashes   [] Ulcers  Psychological:  [x] History of anxiety   [x]  History of major depression.  Physical Examination  There were no vitals filed for this visit. There is no height or weight on file to calculate BMI. Gen: WD/WN, NAD Head: [place]/AT, No temporalis wasting.  Ear/Nose/Throat: Hearing grossly intact, nares w/o erythema or drainage Eyes: PER, EOMI, sclera nonicteric.  Neck: Supple, no masses.  No bruit or JVD.  Pulmonary:  Good air movement, no audible wheezing, no use of accessory muscles.  Cardiac: RRR, normal S1, S2, no Murmurs. Vascular:  mild trophic changes, no open wounds Vessel Right Left  Radial Palpable Palpable  PT BKA Not Palpable  DP BKA Not Palpable  Gastrointestinal: soft,  non-distended. No guarding/no peritoneal signs.  Musculoskeletal: M/S 5/5 throughout.  No visible deformity.  Neurologic: CN 2-12 intact. Pain and light touch intact in extremities.  Symmetrical.  Speech is fluent. Motor exam as listed above. Psychiatric: Judgment intact, Mood & affect appropriate for pt's clinical situation. Dermatologic: No rashes or ulcers noted.  No changes consistent with cellulitis.   CBC Lab Results  Component Value Date   WBC 5.6 11/22/2022   HGB 9.9 (L) 11/22/2022   HCT 31.7 (L) 11/22/2022   MCV 89.0 11/22/2022   PLT 379 11/22/2022    BMET    Component Value Date/Time   NA 136 11/22/2022 0439   NA 134 06/22/2020 1058  K 4.1 11/22/2022 0439   CL 98 11/22/2022 0439   CO2 27 11/22/2022 0439   GLUCOSE 136 (H) 11/22/2022 0439   BUN 20 11/22/2022 0439   BUN 8 06/22/2020 1058   CREATININE 0.94 11/22/2022 0439   CALCIUM  9.3 11/22/2022 0439   GFRNONAA >60 11/22/2022 0439   GFRAA 107 06/22/2020 1058   CrCl cannot be calculated (Patient's most recent lab result is older than the maximum 21 days allowed.).  COAG Lab Results  Component Value Date   INR 1.3 (H) 11/02/2022   INR 1.2 11/02/2022   INR 1.2 08/14/2020    Radiology No results found.   Assessment/Plan There are no diagnoses linked to this encounter.   Cordella Shawl, MD  06/29/2023 4:44 PM

## 2023-07-01 ENCOUNTER — Encounter (INDEPENDENT_AMBULATORY_CARE_PROVIDER_SITE_OTHER): Payer: Medicaid Other

## 2023-07-01 ENCOUNTER — Ambulatory Visit (INDEPENDENT_AMBULATORY_CARE_PROVIDER_SITE_OTHER): Payer: MEDICAID | Admitting: Vascular Surgery

## 2023-07-01 DIAGNOSIS — E1140 Type 2 diabetes mellitus with diabetic neuropathy, unspecified: Secondary | ICD-10-CM

## 2023-07-01 DIAGNOSIS — I70213 Atherosclerosis of native arteries of extremities with intermittent claudication, bilateral legs: Secondary | ICD-10-CM

## 2023-07-01 DIAGNOSIS — I10 Essential (primary) hypertension: Secondary | ICD-10-CM

## 2023-07-01 DIAGNOSIS — J449 Chronic obstructive pulmonary disease, unspecified: Secondary | ICD-10-CM

## 2023-07-01 DIAGNOSIS — E782 Mixed hyperlipidemia: Secondary | ICD-10-CM

## 2023-07-03 ENCOUNTER — Other Ambulatory Visit (INDEPENDENT_AMBULATORY_CARE_PROVIDER_SITE_OTHER): Payer: Self-pay | Admitting: Nurse Practitioner

## 2023-07-03 NOTE — Telephone Encounter (Signed)
 This was done as a one time courtesy until he could establish with his PCP and or psychologist.  Decline

## 2023-07-04 NOTE — Telephone Encounter (Signed)
This was a one time courtesy until he could establish PCP care or with a psychologist.

## 2023-07-05 ENCOUNTER — Other Ambulatory Visit (INDEPENDENT_AMBULATORY_CARE_PROVIDER_SITE_OTHER): Payer: Self-pay | Admitting: Nurse Practitioner

## 2023-07-12 NOTE — Telephone Encounter (Signed)
I called patient to inform in and he understood.

## 2023-09-04 ENCOUNTER — Other Ambulatory Visit (INDEPENDENT_AMBULATORY_CARE_PROVIDER_SITE_OTHER): Payer: Self-pay | Admitting: Nurse Practitioner

## 2023-09-04 DIAGNOSIS — T8743 Infection of amputation stump, right lower extremity: Secondary | ICD-10-CM

## 2023-10-07 ENCOUNTER — Other Ambulatory Visit: Payer: Self-pay

## 2023-10-07 ENCOUNTER — Inpatient Hospital Stay
Admit: 2023-10-07 | Discharge: 2023-10-07 | Disposition: A | Discharge status: Home / Self Care | Payer: MEDICAID | Attending: Student in an Organized Health Care Education/Training Program | Admitting: Student in an Organized Health Care Education/Training Program

## 2023-10-07 ENCOUNTER — Inpatient Hospital Stay
Admission: EM | Admit: 2023-10-07 | Discharge: 2023-10-08 | DRG: 064 | Disposition: A | Discharge status: Other Acute Inpatient Hospital | Payer: MEDICAID | Attending: Internal Medicine | Admitting: Internal Medicine

## 2023-10-07 ENCOUNTER — Inpatient Hospital Stay: Payer: MEDICAID

## 2023-10-07 ENCOUNTER — Emergency Department: Payer: MEDICAID

## 2023-10-07 DIAGNOSIS — Z89511 Acquired absence of right leg below knee: Secondary | ICD-10-CM

## 2023-10-07 DIAGNOSIS — Z716 Tobacco abuse counseling: Secondary | ICD-10-CM | POA: Diagnosis not present

## 2023-10-07 DIAGNOSIS — Y92009 Unspecified place in unspecified non-institutional (private) residence as the place of occurrence of the external cause: Secondary | ICD-10-CM

## 2023-10-07 DIAGNOSIS — E7800 Pure hypercholesterolemia, unspecified: Secondary | ICD-10-CM | POA: Diagnosis present

## 2023-10-07 DIAGNOSIS — Z7902 Long term (current) use of antithrombotics/antiplatelets: Secondary | ICD-10-CM

## 2023-10-07 DIAGNOSIS — Z91030 Bee allergy status: Secondary | ICD-10-CM | POA: Diagnosis not present

## 2023-10-07 DIAGNOSIS — E785 Hyperlipidemia, unspecified: Secondary | ICD-10-CM | POA: Diagnosis present

## 2023-10-07 DIAGNOSIS — R45850 Homicidal ideations: Secondary | ICD-10-CM | POA: Diagnosis present

## 2023-10-07 DIAGNOSIS — F319 Bipolar disorder, unspecified: Secondary | ICD-10-CM | POA: Diagnosis present

## 2023-10-07 DIAGNOSIS — E119 Type 2 diabetes mellitus without complications: Secondary | ICD-10-CM

## 2023-10-07 DIAGNOSIS — J449 Chronic obstructive pulmonary disease, unspecified: Secondary | ICD-10-CM | POA: Diagnosis present

## 2023-10-07 DIAGNOSIS — Z7901 Long term (current) use of anticoagulants: Secondary | ICD-10-CM | POA: Diagnosis not present

## 2023-10-07 DIAGNOSIS — F10120 Alcohol abuse with intoxication, uncomplicated: Secondary | ICD-10-CM | POA: Diagnosis present

## 2023-10-07 DIAGNOSIS — G936 Cerebral edema: Secondary | ICD-10-CM | POA: Diagnosis present

## 2023-10-07 DIAGNOSIS — G4700 Insomnia, unspecified: Secondary | ICD-10-CM | POA: Diagnosis present

## 2023-10-07 DIAGNOSIS — Z79899 Other long term (current) drug therapy: Secondary | ICD-10-CM

## 2023-10-07 DIAGNOSIS — Z9151 Personal history of suicidal behavior: Secondary | ICD-10-CM

## 2023-10-07 DIAGNOSIS — I10 Essential (primary) hypertension: Secondary | ICD-10-CM | POA: Diagnosis present

## 2023-10-07 DIAGNOSIS — R45851 Suicidal ideations: Secondary | ICD-10-CM

## 2023-10-07 DIAGNOSIS — Z1152 Encounter for screening for COVID-19: Secondary | ICD-10-CM | POA: Diagnosis not present

## 2023-10-07 DIAGNOSIS — Z634 Disappearance and death of family member: Secondary | ICD-10-CM

## 2023-10-07 DIAGNOSIS — Z91141 Patient's other noncompliance with medication regimen due to financial hardship: Secondary | ICD-10-CM | POA: Diagnosis not present

## 2023-10-07 DIAGNOSIS — Z7951 Long term (current) use of inhaled steroids: Secondary | ICD-10-CM | POA: Diagnosis not present

## 2023-10-07 DIAGNOSIS — Z993 Dependence on wheelchair: Secondary | ICD-10-CM | POA: Diagnosis not present

## 2023-10-07 DIAGNOSIS — F4310 Post-traumatic stress disorder, unspecified: Secondary | ICD-10-CM | POA: Diagnosis present

## 2023-10-07 DIAGNOSIS — I639 Cerebral infarction, unspecified: Principal | ICD-10-CM

## 2023-10-07 DIAGNOSIS — I63532 Cerebral infarction due to unspecified occlusion or stenosis of left posterior cerebral artery: Secondary | ICD-10-CM | POA: Diagnosis present

## 2023-10-07 DIAGNOSIS — F419 Anxiety disorder, unspecified: Secondary | ICD-10-CM | POA: Diagnosis present

## 2023-10-07 DIAGNOSIS — Z720 Tobacco use: Secondary | ICD-10-CM

## 2023-10-07 DIAGNOSIS — H5461 Unqualified visual loss, right eye, normal vision left eye: Secondary | ICD-10-CM | POA: Diagnosis present

## 2023-10-07 DIAGNOSIS — Z555 Less than a high school diploma: Secondary | ICD-10-CM

## 2023-10-07 DIAGNOSIS — R29702 NIHSS score 2: Secondary | ICD-10-CM | POA: Diagnosis present

## 2023-10-07 DIAGNOSIS — Z7982 Long term (current) use of aspirin: Secondary | ICD-10-CM | POA: Diagnosis not present

## 2023-10-07 DIAGNOSIS — E1165 Type 2 diabetes mellitus with hyperglycemia: Secondary | ICD-10-CM | POA: Diagnosis present

## 2023-10-07 DIAGNOSIS — F17210 Nicotine dependence, cigarettes, uncomplicated: Secondary | ICD-10-CM | POA: Diagnosis present

## 2023-10-07 DIAGNOSIS — F10920 Alcohol use, unspecified with intoxication, uncomplicated: Secondary | ICD-10-CM

## 2023-10-07 DIAGNOSIS — F32A Depression, unspecified: Secondary | ICD-10-CM | POA: Diagnosis not present

## 2023-10-07 DIAGNOSIS — Z91018 Allergy to other foods: Secondary | ICD-10-CM

## 2023-10-07 DIAGNOSIS — Z5986 Financial insecurity: Secondary | ICD-10-CM

## 2023-10-07 DIAGNOSIS — Z89422 Acquired absence of other left toe(s): Secondary | ICD-10-CM

## 2023-10-07 DIAGNOSIS — E1140 Type 2 diabetes mellitus with diabetic neuropathy, unspecified: Secondary | ICD-10-CM | POA: Diagnosis present

## 2023-10-07 DIAGNOSIS — F332 Major depressive disorder, recurrent severe without psychotic features: Secondary | ICD-10-CM | POA: Diagnosis not present

## 2023-10-07 DIAGNOSIS — T383X6A Underdosing of insulin and oral hypoglycemic [antidiabetic] drugs, initial encounter: Secondary | ICD-10-CM | POA: Diagnosis present

## 2023-10-07 DIAGNOSIS — Z89412 Acquired absence of left great toe: Secondary | ICD-10-CM

## 2023-10-07 DIAGNOSIS — Z560 Unemployment, unspecified: Secondary | ICD-10-CM

## 2023-10-07 DIAGNOSIS — Y906 Blood alcohol level of 120-199 mg/100 ml: Secondary | ICD-10-CM | POA: Diagnosis present

## 2023-10-07 LAB — URINE DRUG SCREEN, QUALITATIVE (ARMC ONLY)
Amphetamines, Ur Screen: NOT DETECTED
Barbiturates, Ur Screen: NOT DETECTED
Benzodiazepine, Ur Scrn: NOT DETECTED
Cannabinoid 50 Ng, Ur ~~LOC~~: NOT DETECTED
Cocaine Metabolite,Ur ~~LOC~~: NOT DETECTED
MDMA (Ecstasy)Ur Screen: NOT DETECTED
Methadone Scn, Ur: NOT DETECTED
Opiate, Ur Screen: NOT DETECTED
Phencyclidine (PCP) Ur S: NOT DETECTED
Tricyclic, Ur Screen: NOT DETECTED

## 2023-10-07 LAB — CBC
HCT: 37.7 % — ABNORMAL LOW (ref 39.0–52.0)
HCT: 40.8 % (ref 39.0–52.0)
Hemoglobin: 13.1 g/dL (ref 13.0–17.0)
Hemoglobin: 13.9 g/dL (ref 13.0–17.0)
MCH: 32 pg (ref 26.0–34.0)
MCH: 32.4 pg (ref 26.0–34.0)
MCHC: 34.1 g/dL (ref 30.0–36.0)
MCHC: 34.7 g/dL (ref 30.0–36.0)
MCV: 93.3 fL (ref 80.0–100.0)
MCV: 94 fL (ref 80.0–100.0)
Platelets: 228 10*3/uL (ref 150–400)
Platelets: 243 10*3/uL (ref 150–400)
RBC: 4.04 MIL/uL — ABNORMAL LOW (ref 4.22–5.81)
RBC: 4.34 MIL/uL (ref 4.22–5.81)
RDW: 13.9 % (ref 11.5–15.5)
RDW: 13.9 % (ref 11.5–15.5)
WBC: 6.2 10*3/uL (ref 4.0–10.5)
WBC: 8 10*3/uL (ref 4.0–10.5)
nRBC: 0 % (ref 0.0–0.2)
nRBC: 0 % (ref 0.0–0.2)

## 2023-10-07 LAB — LIPID PANEL
Cholesterol: 245 mg/dL — ABNORMAL HIGH (ref 0–200)
HDL: 37 mg/dL — ABNORMAL LOW (ref >40)
LDL Cholesterol: UNDETERMINED mg/dL (ref 0–99)
Total CHOL/HDL Ratio: 6.6 ratio
Triglycerides: 425 mg/dL — ABNORMAL HIGH (ref <150)
VLDL: UNDETERMINED mg/dL (ref 0–40)

## 2023-10-07 LAB — COMPREHENSIVE METABOLIC PANEL WITH GFR
ALT: 10 U/L (ref 0–44)
AST: 13 U/L — ABNORMAL LOW (ref 15–41)
Albumin: 3.7 g/dL (ref 3.5–5.0)
Alkaline Phosphatase: 57 U/L (ref 38–126)
Anion gap: 9 (ref 5–15)
BUN: 16 mg/dL (ref 8–23)
CO2: 24 mmol/L (ref 22–32)
Calcium: 8.4 mg/dL — ABNORMAL LOW (ref 8.9–10.3)
Chloride: 103 mmol/L (ref 98–111)
Creatinine, Ser: 0.82 mg/dL (ref 0.61–1.24)
GFR, Estimated: >60 mL/min (ref >60)
Glucose, Bld: 306 mg/dL — ABNORMAL HIGH (ref 70–99)
Potassium: 3.9 mmol/L (ref 3.5–5.1)
Sodium: 136 mmol/L (ref 135–145)
Total Bilirubin: 0.5 mg/dL (ref 0.0–1.2)
Total Protein: 6.9 g/dL (ref 6.5–8.1)

## 2023-10-07 LAB — URINALYSIS, ROUTINE W REFLEX MICROSCOPIC
Bacteria, UA: NONE SEEN
Bilirubin Urine: NEGATIVE
Glucose, UA: >=500 mg/dL — AB
Hgb urine dipstick: NEGATIVE
Ketones, ur: NEGATIVE mg/dL
Leukocytes,Ua: NEGATIVE
Nitrite: NEGATIVE
Protein, ur: NEGATIVE mg/dL
Specific Gravity, Urine: 1.008 (ref 1.005–1.030)
Squamous Epithelial / HPF: 0 /HPF (ref 0–5)
pH: 5 (ref 5.0–8.0)

## 2023-10-07 LAB — BASIC METABOLIC PANEL WITH GFR
Anion gap: 10 (ref 5–15)
BUN: 14 mg/dL (ref 8–23)
CO2: 22 mmol/L (ref 22–32)
Calcium: 8.4 mg/dL — ABNORMAL LOW (ref 8.9–10.3)
Chloride: 105 mmol/L (ref 98–111)
Creatinine, Ser: 0.66 mg/dL (ref 0.61–1.24)
GFR, Estimated: >60 mL/min (ref >60)
Glucose, Bld: 228 mg/dL — ABNORMAL HIGH (ref 70–99)
Potassium: 3.6 mmol/L (ref 3.5–5.1)
Sodium: 137 mmol/L (ref 135–145)

## 2023-10-07 LAB — HEMOGLOBIN A1C
Hgb A1c MFr Bld: 9 % — ABNORMAL HIGH (ref 4.8–5.6)
Mean Plasma Glucose: 211.6 mg/dL

## 2023-10-07 LAB — PROTIME-INR
INR: 1 (ref 0.8–1.2)
Prothrombin Time: 13.4 s (ref 11.4–15.2)

## 2023-10-07 LAB — ACETAMINOPHEN LEVEL: Acetaminophen (Tylenol), Serum: <10 ug/mL — ABNORMAL LOW (ref 10–30)

## 2023-10-07 LAB — MAGNESIUM: Magnesium: 1.8 mg/dL (ref 1.7–2.4)

## 2023-10-07 LAB — TSH: TSH: 1.856 u[IU]/mL (ref 0.350–4.500)

## 2023-10-07 LAB — ETHANOL: Alcohol, Ethyl (B): 182 mg/dL — ABNORMAL HIGH (ref <10)

## 2023-10-07 LAB — LDL CHOLESTEROL, DIRECT: Direct LDL: 141 mg/dL — ABNORMAL HIGH (ref 0–99)

## 2023-10-07 LAB — SALICYLATE LEVEL: Salicylate Lvl: <7 mg/dL — ABNORMAL LOW (ref 7.0–30.0)

## 2023-10-07 LAB — CBG MONITORING, ED
Glucose-Capillary: 213 mg/dL — ABNORMAL HIGH (ref 70–99)
Glucose-Capillary: 121 mg/dL — ABNORMAL HIGH (ref 70–99)
Glucose-Capillary: 168 mg/dL — ABNORMAL HIGH (ref 70–99)

## 2023-10-07 LAB — APTT: aPTT: 28 s (ref 24–36)

## 2023-10-07 MED ORDER — NICOTINE 21 MG/24HR TD PT24
21.0000 mg | MEDICATED_PATCH | Freq: Every day | TRANSDERMAL | Status: DC
  Administered 2023-10-07 – 2023-10-08 (×2): 21 mg via TRANSDERMAL
  Filled 2023-10-07 (×2): qty 1

## 2023-10-07 MED ORDER — ATORVASTATIN CALCIUM 20 MG PO TABS
80.0000 mg | ORAL_TABLET | Freq: Every day | ORAL | Status: DC
  Administered 2023-10-07 – 2023-10-08 (×2): 80 mg via ORAL
  Filled 2023-10-07 (×2): qty 4

## 2023-10-07 MED ORDER — ALBUTEROL SULFATE (2.5 MG/3ML) 0.083% IN NEBU: 2.5000 mg | INHALATION_SOLUTION | RESPIRATORY_TRACT | Status: DC | PRN

## 2023-10-07 MED ORDER — GABAPENTIN 400 MG PO CAPS
800.0000 mg | ORAL_CAPSULE | Freq: Three times a day (TID) | ORAL | Status: DC
Start: 2023-10-07 — End: 2023-10-08
  Administered 2023-10-07 – 2023-10-08 (×5): 800 mg via ORAL
  Filled 2023-10-07 (×3): qty 8
  Filled 2023-10-07: qty 2
  Filled 2023-10-07: qty 8
  Filled 2023-10-07 (×2): qty 2
  Filled 2023-10-07: qty 8
  Filled 2023-10-07 (×3): qty 2

## 2023-10-07 MED ORDER — VENLAFAXINE HCL ER 75 MG PO CP24
225.0000 mg | ORAL_CAPSULE | Freq: Every day | ORAL | Status: DC
Start: 2023-10-07 — End: 2023-10-08
  Administered 2023-10-07 – 2023-10-08 (×2): 225 mg via ORAL
  Filled 2023-10-07 (×3): qty 3

## 2023-10-07 MED ORDER — INSULIN GLARGINE-YFGN 100 UNIT/ML ~~LOC~~ SOLN
5.0000 [IU] | Freq: Every day | SUBCUTANEOUS | Status: DC
  Filled 2023-10-07: qty 0.05

## 2023-10-07 MED ORDER — BISACODYL 5 MG PO TBEC: 5.0000 mg | DELAYED_RELEASE_TABLET | Freq: Every day | ORAL | Status: DC | PRN

## 2023-10-07 MED ORDER — FLUTICASONE FUROATE-VILANTEROL 100-25 MCG/ACT IN AEPB
1.0000 | INHALATION_SPRAY | Freq: Every day | RESPIRATORY_TRACT | Status: DC
  Administered 2023-10-07 – 2023-10-08 (×2): 1 via RESPIRATORY_TRACT
  Filled 2023-10-07 (×2): qty 28

## 2023-10-07 MED ORDER — ACETAMINOPHEN 325 MG PO TABS
650.0000 mg | ORAL_TABLET | Freq: Four times a day (QID) | ORAL | Status: DC | PRN
  Administered 2023-10-08: 650 mg via ORAL
  Filled 2023-10-07: qty 2

## 2023-10-07 MED ORDER — ROSUVASTATIN CALCIUM 5 MG PO TABS
5.0000 mg | ORAL_TABLET | Freq: Every day | ORAL | Status: DC
  Administered 2023-10-07: 5 mg via ORAL
  Filled 2023-10-07: qty 1

## 2023-10-07 MED ORDER — OLANZAPINE 10 MG PO TABS: 10.0000 mg | ORAL_TABLET | Freq: Three times a day (TID) | ORAL | Status: DC | PRN

## 2023-10-07 MED ORDER — METFORMIN HCL 500 MG PO TABS: 1000.0000 mg | ORAL_TABLET | Freq: Two times a day (BID) | ORAL | Status: DC

## 2023-10-07 MED ORDER — CLOPIDOGREL BISULFATE 75 MG PO TABS
300.0000 mg | ORAL_TABLET | Freq: Once | ORAL | Status: AC
  Administered 2023-10-07: 300 mg via ORAL
  Filled 2023-10-07: qty 4

## 2023-10-07 MED ORDER — ONDANSETRON HCL 4 MG/2ML IJ SOLN: 4.0000 mg | Freq: Four times a day (QID) | INTRAMUSCULAR | Status: DC | PRN

## 2023-10-07 MED ORDER — ASPIRIN 81 MG PO TBEC
81.0000 mg | DELAYED_RELEASE_TABLET | Freq: Every day | ORAL | Status: DC
  Administered 2023-10-08: 81 mg via ORAL
  Filled 2023-10-07: qty 1

## 2023-10-07 MED ORDER — METOPROLOL SUCCINATE ER 50 MG PO TB24
50.0000 mg | ORAL_TABLET | Freq: Every day | ORAL | Status: DC
  Administered 2023-10-07 – 2023-10-08 (×2): 50 mg via ORAL
  Filled 2023-10-07 (×2): qty 1

## 2023-10-07 MED ORDER — CLOPIDOGREL BISULFATE 75 MG PO TABS: 300.0000 mg | ORAL_TABLET | Freq: Every day | ORAL | Status: DC

## 2023-10-07 MED ORDER — PANTOPRAZOLE SODIUM 40 MG PO TBEC
40.0000 mg | DELAYED_RELEASE_TABLET | Freq: Every day | ORAL | Status: DC
  Administered 2023-10-07: 40 mg via ORAL
  Filled 2023-10-07: qty 1

## 2023-10-07 MED ORDER — POLYETHYLENE GLYCOL 3350 17 G PO PACK
17.0000 g | PACK | Freq: Every day | ORAL | Status: DC
  Filled 2023-10-07 (×2): qty 1

## 2023-10-07 MED ORDER — ENOXAPARIN SODIUM 40 MG/0.4ML IJ SOSY
40.0000 mg | PREFILLED_SYRINGE | INTRAMUSCULAR | Status: DC
  Administered 2023-10-07 – 2023-10-08 (×2): 40 mg via SUBCUTANEOUS
  Filled 2023-10-07 (×2): qty 0.4

## 2023-10-07 MED ORDER — ASPIRIN 81 MG PO CHEW
324.0000 mg | CHEWABLE_TABLET | Freq: Once | ORAL | Status: AC
  Administered 2023-10-07: 324 mg via ORAL
  Filled 2023-10-07: qty 4

## 2023-10-07 MED ORDER — ACETAMINOPHEN 325 MG RE SUPP: 650.0000 mg | Freq: Four times a day (QID) | RECTAL | Status: DC | PRN

## 2023-10-07 MED ORDER — INSULIN ASPART 100 UNIT/ML IJ SOLN
0.0000 [IU] | Freq: Three times a day (TID) | INTRAMUSCULAR | Status: DC
  Administered 2023-10-07: 3 [IU] via SUBCUTANEOUS
  Administered 2023-10-07: 2 [IU] via SUBCUTANEOUS
  Administered 2023-10-07 – 2023-10-08 (×4): 5 [IU] via SUBCUTANEOUS
  Filled 2023-10-07 (×6): qty 1

## 2023-10-07 MED ORDER — IOHEXOL 350 MG/ML SOLN
75.0000 mL | Freq: Once | INTRAVENOUS | Status: AC | PRN
Start: 2023-10-07 — End: 2023-10-07
  Administered 2023-10-07: 75 mL via INTRAVENOUS

## 2023-10-07 MED ORDER — APIXABAN 5 MG PO TABS
5.0000 mg | ORAL_TABLET | Freq: Two times a day (BID) | ORAL | Status: DC
  Administered 2023-10-07: 5 mg via ORAL
  Filled 2023-10-07: qty 1

## 2023-10-07 MED ORDER — ASPIRIN 81 MG PO TBEC: 81.0000 mg | DELAYED_RELEASE_TABLET | Freq: Every day | ORAL | Status: DC

## 2023-10-07 MED ORDER — ONDANSETRON HCL 4 MG PO TABS: 4.0000 mg | ORAL_TABLET | Freq: Four times a day (QID) | ORAL | Status: DC | PRN

## 2023-10-07 MED ORDER — PANTOPRAZOLE SODIUM 40 MG PO TBEC
40.0000 mg | DELAYED_RELEASE_TABLET | Freq: Every day | ORAL | Status: DC
  Administered 2023-10-08: 40 mg via ORAL
  Filled 2023-10-07: qty 1

## 2023-10-07 MED ORDER — VENLAFAXINE HCL ER 75 MG PO CP24: 75.0000 mg | ORAL_CAPSULE | Freq: Every day | ORAL | Status: DC

## 2023-10-07 MED ORDER — INSULIN GLARGINE-YFGN 100 UNIT/ML ~~LOC~~ SOLN
25.0000 [IU] | Freq: Every day | SUBCUTANEOUS | Status: DC
  Administered 2023-10-07: 25 [IU] via SUBCUTANEOUS
  Filled 2023-10-07: qty 0.25

## 2023-10-07 NOTE — ED Notes (Addendum)
 Pt reporting to ED initially d/t endorsing SI. Once here, pt reported visual disturbances, was scanned and a subacute infarct was found. Pt aox4 at this time. Pt denies complaints of weakness from baseline. Pt not currently endorsing vision changes. Pt ABCs intact. RR even and unlabored. Pt in NAD. Bed in lowest locked position. Call bell in reach. Denies needs at this time.   Past Medical History:  Diagnosis Date   Anxiety    COPD (chronic obstructive pulmonary disease) (HCC)    Depression    Diabetes mellitus without complication (HCC)    GERD (gastroesophageal reflux disease)    Hypercholesteremia    Hypertension    Pneumonia    PTSD (post-traumatic stress disorder)

## 2023-10-07 NOTE — Evaluation (Signed)
 Occupational Therapy Evaluation Patient Details Name: Timothy [name]. MRN: [id number] DOB: 05-Nov-1960 Today's Date: 10/07/2023   History of Present Illness   Pt is a 63 y.o. 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 Presented with SI and visual changes/hallucinations since the beginning of the month. PMH significant for R BKA, tobacco use, type 2 diabetes, HTN, HLD, COPD, PTSD, bipolar disorder, anxiety.     Clinical Impressions Pt was seen for PT/OT evaluations this date. Prior to hospital admission, pt was living at home with 2-3 friends. Reports MOD I with W/C transfers and W/C for most mobility with ability to hop 3-4 steps over gates to get from room to room in his home where he has multiple wheelchairs on each side of the gates to utilize. Reports IND with ADLs and admits to 7-8 falls in the last 6 months.   Pt presents to acute OT demonstrating that he is at baseline with his mobility and transfers-MOD I/SUP. Pt's W/C brakes need to be tightened and he has lack of safety awareness as he frequently forgets to lock the brakes prior to transfers. Edu on safety with all transfers. Pt demo bed mobility, SPT from bed<>W/C and W/C <> toilet with MOD I/SUP. Reports minimal weakness, however MMT shows 4/5 strength in BUEs, sensation and coordination intact. No difficulty reported with self feeding. R sided visual field deficits are minimal per pt report he can still see and is able to read the board in ED room, but has to compensate with head turns and needs increased time to focus-did not appear to affect his safety during transfers. Pt reports moving normal, just feels as if it takes more energy than usual. Edu on importance of continuing to move and be independent while here. Will refer to mobility specialists to continue getting patient OOB.  OT to sign off in house and do not anticipate the need for follow up  OT services upon acute hospital DC.      If plan is discharge home, recommend the following:         Functional Status Assessment   Patient has not had a recent decline in their functional status     Equipment Recommendations   Wheelchair (measurements OT);Wheelchair cushion (measurements OT) (pt likely just got one less than a year ago)     Recommendations for Other Services         Precautions/Restrictions   Precautions Precautions: Fall Restrictions Weight Bearing Restrictions Per Provider Order: No     Mobility Bed Mobility Overal bed mobility: Modified Independent                  Transfers Overall transfer level: Modified independent                 General transfer comment: MOD I/SUP for SPT to bed>W/C>toilet>w/c>bed; cueing to lock W/C brakes prior to transfers to prevent falls      Balance Overall balance assessment: Mild deficits observed, not formally tested                                         ADL either performed or assessed with clinical judgement   ADL Overall ADL's : At baseline  General ADL Comments: MOD I with bed mobility and MOD I/SUP for SPT to bed>W/C>toilet>w/c>bed     Vision Ability to See in Adequate Light: 2 Moderately impaired Patient Visual Report: Blurring of vision Additional Comments: reports having to use head turns to read at this time; can see objects, but needs increased time to focus and one side is darker than the other; R vision field deficits minimal     Perception         Praxis         Pertinent Vitals/Pain Pain Assessment Pain Assessment: Faces Faces Pain Scale: Hurts a little bit Pain Location: R BKA/phantom pains Pain Intervention(s): Monitored during session     Extremity/Trunk Assessment Upper Extremity Assessment Upper Extremity Assessment: Overall WFL for tasks assessed (BUE ROM WFL, BUE strength 4/5  grossly; sensation and coordination intact)   Lower Extremity Assessment Lower Extremity Assessment: Overall WFL for tasks assessed       Communication Communication Communication: No apparent difficulties   Cognition Arousal: Alert Behavior During Therapy: WFL for tasks assessed/performed Cognition: No apparent impairments             OT - Cognition Comments: safety awareness deficits                 Following commands: Intact       Cueing  General Comments   Cueing Techniques: Verbal cues      Exercises Other Exercises Other Exercises: Edu on role of OT in acute setting.   Shoulder Instructions      Home Living Family/patient expects to be discharged to:: Private residence Living Arrangements: Non-relatives/Friends Available Help at Discharge: Friend(s) (3 people) Type of Home: House Home Access: Ramped entrance     Home Layout: One level     Bathroom Shower/Tub: Chief Strategy Officer: Standard     Home Equipment: Grab bars - toilet;Grab bars - tub/shower;Wheelchair - manual          Prior Functioning/Environment Prior Level of Function : History of Falls (last six months)             Mobility Comments: 7-8 falls in the last 6 months; mostly W/C bound, can hop 3-4 hops; reports mult W/C in the home where he has the hop through dog/baby gates room to room to get to the next W/C ADLs Comments: IND/MOD I; gets down in the tub for bathing    OT Problem List: Decreased safety awareness   OT Treatment/Interventions:        OT Goals(Current goals can be found in the care plan section)       OT Frequency:       Co-evaluation PT/OT/SLP Co-Evaluation/Treatment: Yes Reason for Co-Treatment: To address functional/ADL transfers;Necessary to address cognition/behavior during functional activity;For patient/therapist safety PT goals addressed during session: Mobility/safety with mobility OT goals addressed during session:  ADL's and self-care      AM-PAC OT "6 Clicks" Daily Activity     Outcome Measure Help from another person eating meals?: None Help from another person taking care of personal grooming?: None Help from another person toileting, which includes using toliet, bedpan, or urinal?: None Help from another person bathing (including washing, rinsing, drying)?: None Help from another person to put on and taking off regular upper body clothing?: None Help from another person to put on and taking off regular lower body clothing?: None 6 Click Score: 24   End of Session Equipment Utilized During Treatment:  (W/C) Nurse Communication:  Mobility status  Activity Tolerance: Patient tolerated treatment well Patient left: in bed;with call bell/phone within reach  OT Visit Diagnosis: Other abnormalities of gait and mobility (R26.89)                Time: 4098-1191 OT Time Calculation (min): 16 min Charges:  OT General Charges $OT Visit: 1 Visit OT Evaluation $OT Eval Low Complexity: 1 Low Ardian Haberland, OTR/L 10/07/23, 2:29 PM  Sven Pinheiro E Alivia Cimino 10/07/2023, 2:23 PM

## 2023-10-07 NOTE — ED Notes (Signed)
 Pt changed into burgundy scrubs per facility protocol. Belongings placed into single belongings bag and labeled appropriately. Belongings include: orange shirt, wallet, and gray shorts. Pt sitting in middle of triage area and awaiting room.

## 2023-10-07 NOTE — ED Provider Notes (Addendum)
 [facility] Provider Note    Event Date/Time   First MD Initiated Contact with Patient 10/07/23 [phone]     (approximate)   History   Suicidal   HPI  Timothy [name]. is a 63 y.o. male with history of diabetes, hypertension, hyperlipidemia, right BKA, COPD, PTSD, bipolar disorder, anxiety who presents to the emergency department complaints of increased suicidal thoughts without plan.  Has been drinking alcohol tonight.  No drug use.  Denies HI, hallucinations.  Has been previously admitted to a psychiatric hospital in [place] and feels like he needs admission again.  Reports increased stressors with his living situation and finances.  He also reports over the past 2-3 weeks he has had loss of vision in his right eye.  Feels like his vision has been cut in half and he sees flashes of lights.  No eye pain, headache, head injury.  Denies prior history of stroke.  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   History provided by patient.    Past Medical History:  Diagnosis Date   Anxiety    COPD (chronic obstructive pulmonary disease) (HCC)    Depression    Diabetes mellitus without complication (HCC)    GERD (gastroesophageal reflux disease)    Hypercholesteremia    Hypertension    Pneumonia    PTSD (post-traumatic stress disorder)     Past Surgical History:  Procedure Laterality Date   AMPUTATION Left 03/22/2020   Procedure: AMPUTATION RAY-1st Ray Left Foot;  Surgeon: Angel Barba, [name];  Location: [facility];  Service: Podiatry;  Laterality: Left;   AMPUTATION Right 11/07/2022   Procedure: AMPUTATION BELOW KNEE;  Surgeon: Celso College, MD;  Location: [facility];  Service: Vascular;  Laterality: Right;   AMPUTATION TOE Left 03/16/2020   Procedure: AMPUTATION GREAT TOE;  Surgeon: Angel Barba, [name];  Location: [facility];  Service: Podiatry;  Laterality: Left;   AMPUTATION TOE Left 04/06/2022   Procedure: AMPUTATION TOE;   Surgeon: Angel Barba, [name];  Location: [facility];  Service: Podiatry;  Laterality: Left;   COLONOSCOPY WITH PROPOFOL  N/A 12/22/2019   Procedure: COLONOSCOPY WITH PROPOFOL ;  Surgeon: Marnee Sink, MD;  Location: [facility];  Service: Endoscopy;  Laterality: N/A;   INCISION AND DRAINAGE ABSCESS Left 08/11/2021   Procedure: INCISION AND DRAINAGE ABSCESS;  Surgeon: Lesly Raspberry, MD;  Location: [facility];  Service: ENT;  Laterality: Left;   LOWER EXTREMITY ANGIOGRAPHY Left 03/18/2020   Procedure: Lower Extremity Angiography;  Surgeon: Jackquelyn Mass, MD;  Location: [facility];  Service: Cardiovascular;  Laterality: Left;   LOWER EXTREMITY ANGIOGRAPHY Right 11/05/2022   Procedure: Lower Extremity Angiography;  Surgeon: Celso College, MD;  Location: [facility];  Service: Cardiovascular;  Laterality: Right;   TONSILLECTOMY     VIDEO ASSISTED THORACOSCOPY (VATS)/DECORTICATION Left 08/15/2020   Procedure: VIDEO ASSISTED THORACOSCOPY (VATS)/DECORTICATION;  Surgeon: Hilarie Lovely, MD;  Location: [facility];  Service: Thoracic;  Laterality: Left;   WISDOM TOOTH EXTRACTION      MEDICATIONS:  Prior to Admission medications   Medication Sig Start Date End Date Taking? Authorizing Provider  acetaminophen  (TYLENOL ) 650 MG CR tablet Take 1,300 mg by mouth every 8 (eight) hours as needed for pain.    [provider]  apixaban  (ELIQUIS ) 5 MG TABS tablet Take 5 mg by mouth 2 (two) times daily.    [provider]  aspirin  EC 81 MG tablet Take 1 tablet (81  mg total) by mouth daily. Swallow whole. Patient not taking: Reported on 12/10/2022 11/25/22   Donaciano Frizzle, MD  blood glucose meter kit and supplies KIT Dispense based on patient and insurance preference. Use up to four times daily as directed. (FOR ICD-9 250.00, 250.01). 10/15/19   Iloabachie, Chioma E, NP  cloNIDine  (CATAPRES ) 0.1 MG tablet Take 1 tablet (0.1 mg total) by mouth at bedtime. Patient not taking: Reported on  11/08/2022 08/10/22 11/08/22  Nwoko, Uchenna E, PA  clopidogrel  (PLAVIX ) 75 MG tablet Take 1 tablet (75 mg total) by mouth daily. Patient not taking: Reported on 12/10/2022 11/25/22   Donaciano Frizzle, MD  cyanocobalamin  (VITAMIN B12) 1000 MCG tablet Take 1 tablet (1,000 mcg total) by mouth daily. 11/24/22   Donaciano Frizzle, MD  gabapentin  (NEURONTIN ) 800 MG tablet TAKE 1 TABLET BY MOUTH THREE TIMES A DAY 05/10/23   Brown, Fallon E, NP  insulin  glargine (LANTUS ) 100 UNIT/ML Solostar Pen Inject 25 Units into the skin at bedtime. 11/24/22   Donaciano Frizzle, MD  Insulin  Pen Needle (PEN NEEDLES) 32G X 6 MM MISC 1 each by Does not apply route daily. 11/24/22   Donaciano Frizzle, MD  metFORMIN  (GLUCOPHAGE ) 1000 MG tablet TAKE ONE TABLET (1,000MG  TOTAL) BY MOUTH TWICE DAILY. 01/16/21     metoprolol  succinate (TOPROL -XL) 50 MG 24 hr tablet Take 50 mg by mouth daily. 07/21/21   [provider]  mometasone -formoterol  (DULERA ) 100-5 MCG/ACT AERO Inhale 2 puffs into the lungs 2 (two) times daily. 11/24/22   Donaciano Frizzle, MD  pantoprazole  (PROTONIX ) 40 MG tablet TAKE ONE TABLET (40MG  TOTAL) BY MOUTH ONCE DAILY. Patient taking differently: Take 40 mg by mouth daily. 01/16/21     rosuvastatin  (CRESTOR ) 5 MG tablet TAKE ONE TABLET (5MG  TOTAL) BY MOUTH ONCE DAILY. 11/24/22   Donaciano Frizzle, MD  sulfamethoxazole -trimethoprim  (BACTRIM  DS) 800-160 MG tablet Take 1 tablet by mouth 2 (two) times daily. 01/04/23   Brown, Fallon E, NP  venlafaxine  XR (EFFEXOR  XR) 150 MG 24 hr capsule Take 1 capsule (150 mg total) by mouth daily with breakfast. Patient to take medication with 75 mg tablet for (225 mg total). 01/04/23 04/04/23  Brown, Fallon E, NP  venlafaxine  XR (EFFEXOR  XR) 75 MG 24 hr capsule Take 1 capsule (75 mg total) by mouth daily with breakfast. Take with one capsule daily with 150 mg capsule for total dose of 225 mg daily 01/04/23   Brown, Fallon E, NP  citalopram  (CELEXA ) 20 MG tablet Take 1 tablet (20 mg total) by mouth daily. Patient taking  differently: Take 20 mg by mouth at bedtime. 11/09/20 01/17/21  Iloabachie, Chioma E, NP    Physical Exam   Triage Vital Signs: ED Triage Vitals  Encounter Vitals Group     BP 10/07/23 0030 110/67     Systolic BP Percentile --      Diastolic BP Percentile --      Pulse Rate 10/07/23 0030 70     Resp 10/07/23 0030 19     Temp 10/07/23 0030 98.6 F (37 C)     Temp Source 10/07/23 0030 Oral     SpO2 10/07/23 0030 97 %     Weight 10/07/23 0026 180 lb (81.6 kg)     Height 10/07/23 0026 5\' 10"  (1.778 m)     Head Circumference --      Peak Flow --      Pain Score 10/07/23 0025 0     Pain Loc --  Pain Education --      Exclude from Growth Chart --     Most recent vital signs: Vitals:   10/07/23 0030  BP: 110/67  Pulse: 70  Resp: 19  Temp: 98.6 F (37 C)  SpO2: 97%    CONSTITUTIONAL: Alert, responds appropriately to questions. Well-appearing; well-nourished HEAD: Normocephalic, atraumatic EYES: Conjunctivae clear, pupils appear equal, sclera nonicteric, extraocular movements intact, no proptosis, no hypopyon or hyphema, no chemosis ENT: normal nose; moist mucous membranes NECK: Supple, normal ROM CARD: RRR; S1 and S2 appreciated RESP: Normal chest excursion without splinting or tachypnea; breath sounds clear and equal bilaterally; no wheezes, no rhonchi, no rales, no hypoxia or respiratory distress, speaking full sentences ABD/GI: Non-distended; soft, non-tender, no rebound, no guarding, no peritoneal signs BACK: The back appears normal EXT: Normal ROM in all joints; no deformity noted, no edema, status post right BKA SKIN: Normal color for age and race; warm; no rash on exposed skin NEURO: Moves all extremities equally, normal speech PSYCH: The patient's mood and manner are appropriate.   ED Results / Procedures / Treatments   LABS: (all labs ordered are listed, but only abnormal results are displayed) Labs Reviewed  COMPREHENSIVE METABOLIC PANEL WITH GFR -  Abnormal; Notable for the following components:      Result Value   Glucose, Bld 306 (*)    Calcium  8.4 (*)    AST 13 (*)    All other components within normal limits  ETHANOL - Abnormal; Notable for the following components:   Alcohol, Ethyl (B) 182 (*)    All other components within normal limits  SALICYLATE LEVEL - Abnormal; Notable for the following components:   Salicylate Lvl <7.0 (*)    All other components within normal limits  ACETAMINOPHEN  LEVEL - Abnormal; Notable for the following components:   Acetaminophen  (Tylenol ), Serum <10 (*)    All other components within normal limits  URINALYSIS, ROUTINE W REFLEX MICROSCOPIC - Abnormal; Notable for the following components:   Color, Urine STRAW (*)    APPearance CLEAR (*)    Glucose, UA >=500 (*)    All other components within normal limits  CBC  URINE DRUG SCREEN, QUALITATIVE ([facility] ONLY)  MAGNESIUM   PROTIME-INR  APTT  CBG MONITORING, ED  CBG MONITORING, ED  CBG MONITORING, ED  CBG MONITORING, ED     EKG:  EKG Interpretation Date/Time:  Monday October 07 2023 02:33:32 EDT Ventricular Rate:  71 PR Interval:  170 QRS Duration:  124 QT Interval:  428 QTC Calculation: 465 R Axis:   36  Text Interpretation: Normal sinus rhythm Right bundle branch block Abnormal ECG When compared with ECG of 02-Nov-2022 13:38, PREVIOUS ECG IS PRESENT Confirmed by Verneda Golder [phone]) on 10/07/2023 3:00:32 AM         RADIOLOGY: My personal review and interpretation of imaging: MRI brain shows acute left PCA infarct.  I have personally reviewed all radiology reports.   MR BRAIN WO CONTRAST Result Date: 10/07/2023 CLINICAL DATA:  Neuro deficit, acute, stroke suspected EXAM: MRI HEAD WITHOUT CONTRAST TECHNIQUE: Multiplanar, multiecho pulse sequences of the brain and surrounding structures were obtained without intravenous contrast. COMPARISON:  CT head May 24, 24. FINDINGS: Brain: Acute or early subacute left PCA territory infarct  involving the except all lobes. Associated edema without substantial mass effect. No evidence of acute hemorrhage, mass lesion, midline shift or hydrocephalus. Vascular: Major arterial flow voids are maintained. Skull and upper cervical spine: Normal marrow signal. Sinuses/Orbits: Clear sinuses.  No acute findings. 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 Associated edema without significant mass effect. Electronically Signed   By: Stevenson Elbe M.D.   On: 10/07/2023 02:04     PROCEDURES:  Critical Care performed: Yes, see critical care procedure note(s)   CRITICAL CARE Performed by: Starling Eck Brittin Belnap   Total critical care time: 30 minutes  Critical care time was exclusive of separately billable procedures and treating other patients.  Critical care was necessary to treat or prevent imminent or life-threatening deterioration.  Critical care was time spent personally by me on the following activities: development of treatment plan with patient and/or surrogate as well as nursing, discussions with consultants, evaluation of patient's response to treatment, examination of patient, obtaining history from patient or surrogate, ordering and performing treatments and interventions, ordering and review of laboratory studies, ordering and review of radiographic studies, pulse oximetry and re-evaluation of patient's condition.   Aaron Aas1-3 Lead EKG Interpretation  Performed by: Ayo Smoak, Clover Dao, DO Authorized by: Caelynn Marshman, Clover Dao, DO     Interpretation: normal     ECG rate:  70   ECG rate assessment: normal     Rhythm: sinus rhythm     Ectopy: none     Conduction: normal       IMPRESSION / MDM / ASSESSMENT AND PLAN / ED COURSE  I reviewed the triage vital signs and the nursing notes.    Patient here with complaints of suicidal thoughts.  Reports this is chronic but feels like it has worsened over the past 2 to 3 weeks because of changes in vision in his right eye.  The  patient is on the cardiac monitor to evaluate for evidence of arrhythmia and/or significant heart rate changes.   DIFFERENTIAL DIAGNOSIS (includes but not limited to):   CVA, TIA, intracranial hemorrhage, multiple sclerosis, CVT, mass, alcohol intoxication, depression, anxiety   Patient's presentation is most consistent with acute presentation with potential threat to life or bodily function.   PLAN: Screening labs, urine ordered from triage.  Will also obtain MRI of the brain.  I am concerned for possible stroke.  Will obtain visual acuity as well.   MEDICATIONS GIVEN IN ED: Medications  apixaban  (ELIQUIS ) tablet 5 mg (5 mg Oral Given 10/07/23 0227)  aspirin  EC tablet 81 mg (has no administration in time range)  gabapentin  (NEURONTIN ) capsule 800 mg (has no administration in time range)  insulin  glargine-yfgn (SEMGLEE ) injection 25 Units (25 Units Subcutaneous Given 10/07/23 0227)  metFORMIN  (GLUCOPHAGE ) tablet 1,000 mg (has no administration in time range)  metoprolol  succinate (TOPROL -XL) 24 hr tablet 50 mg (has no administration in time range)  fluticasone  furoate-vilanterol (BREO ELLIPTA ) 100-25 MCG/ACT 1 puff (has no administration in time range)  pantoprazole  (PROTONIX ) EC tablet 40 mg (has no administration in time range)  rosuvastatin  (CRESTOR ) tablet 5 mg (has no administration in time range)  venlafaxine  XR (EFFEXOR -XR) 24 hr capsule 225 mg (has no administration in time range)  aspirin  chewable tablet 324 mg (has no administration in time range)     ED COURSE: Patient's labs show glucose of 306 but no DKA.  Alcohol level of 182.  Negative Tylenol  and salicylate levels.  Urine shows no sign of infection.  MRI of the brain reviewed and interpreted by myself and the radiologist and shows an acute to early subacute left PCA infarct.  Will give full dose aspirin .  He is on Eliquis  already.  Nightly dose has been given.  He is outside  tPA window.  Will discuss with hospitalist for  admission.   CONSULTS:  Consulted and discussed patient's case with hospitalist, Dr. Alva Jewels.  I have recommended admission and consulting physician agrees and will place admission orders.  Patient (and family if present) agree with this plan.   I reviewed all nursing notes, vitals, pertinent previous records.  All labs, EKGs, imaging ordered have been independently reviewed and interpreted by myself.    OUTSIDE RECORDS REVIEWED: Reviewed last internal medicine note on 02/08/2023.       FINAL CLINICAL IMPRESSION(S) / ED DIAGNOSES   Final diagnoses:  Acute CVA (cerebrovascular accident) (HCC)  Suicidal ideation  Alcoholic intoxication without complication (HCC)     Rx / DC Orders   ED Discharge Orders     None        Note:  This document was prepared using Dragon voice recognition software and may include unintentional dictation errors.   Marquist Binstock, Clover Dao, DO 10/07/23 0303    Stace Peace, Clover Dao, DO 10/07/23 [phone]

## 2023-10-07 NOTE — Evaluation (Signed)
 Clinical/Bedside Swallow Evaluation Patient Details  Name: Timothy [name]. MRN: [id number] Date of Birth: 1961-02-12  Today's Date: 10/07/2023 Time: SLP Start Time (ACUTE ONLY): 1230 SLP Stop Time (ACUTE ONLY): 1300 SLP Time Calculation (min) (ACUTE ONLY): 30 min  Past Medical History:  Past Medical History:  Diagnosis Date   Anxiety    COPD (chronic obstructive pulmonary disease) (HCC)    Depression    Diabetes mellitus without complication (HCC)    GERD (gastroesophageal reflux disease)    Hypercholesteremia    Hypertension    Pneumonia    PTSD (post-traumatic stress disorder)    Past Surgical History:  Past Surgical History:  Procedure Laterality Date   AMPUTATION Left 03/22/2020   Procedure: AMPUTATION RAY-1st Ray Left Foot;  Surgeon: Angel Barba, [name];  Location: [facility];  Service: Podiatry;  Laterality: Left;   AMPUTATION Right 11/07/2022   Procedure: AMPUTATION BELOW KNEE;  Surgeon: Celso College, MD;  Location: [facility];  Service: Vascular;  Laterality: Right;   AMPUTATION TOE Left 03/16/2020   Procedure: AMPUTATION GREAT TOE;  Surgeon: Angel Barba, [name];  Location: [facility];  Service: Podiatry;  Laterality: Left;   AMPUTATION TOE Left 04/06/2022   Procedure: AMPUTATION TOE;  Surgeon: Angel Barba, [name];  Location: [facility];  Service: Podiatry;  Laterality: Left;   COLONOSCOPY WITH PROPOFOL  N/A 12/22/2019   Procedure: COLONOSCOPY WITH PROPOFOL ;  Surgeon: Marnee Sink, MD;  Location: [facility];  Service: Endoscopy;  Laterality: N/A;   INCISION AND DRAINAGE ABSCESS Left 08/11/2021   Procedure: INCISION AND DRAINAGE ABSCESS;  Surgeon: Lesly Raspberry, MD;  Location: [facility];  Service: ENT;  Laterality: Left;   LOWER EXTREMITY ANGIOGRAPHY Left 03/18/2020   Procedure: Lower Extremity Angiography;  Surgeon: Jackquelyn Mass, MD;  Location: [facility];  Service: Cardiovascular;  Laterality: Left;   LOWER EXTREMITY ANGIOGRAPHY Right 11/05/2022   Procedure: Lower  Extremity Angiography;  Surgeon: Celso College, MD;  Location: [facility];  Service: Cardiovascular;  Laterality: Right;   TONSILLECTOMY     VIDEO ASSISTED THORACOSCOPY (VATS)/DECORTICATION Left 08/15/2020   Procedure: VIDEO ASSISTED THORACOSCOPY (VATS)/DECORTICATION;  Surgeon: Hilarie Lovely, MD;  [facility];  Service: Thoracic;  Laterality: Left;   WISDOM TOOTH EXTRACTION     HPI:  Timothy [name]. is a 63 y.o. male with a PMH significant for R BKA, tobacco use, type 2 diabetes, HTN, HLD, COPD, PTSD, bipolar disorder, anxiety.  At baseline, they live with a friend and are mostly self-sufficient for ADLs. Wheelchair bound.     They presented from home to the ED on 10/07/2023 with SI and visual changes since the beginning of the month.  Patient describes sudden onset of hallucinations such as black-and-white children holding hands and walking from 1 tree to another, dark black blankets flying towards him, etc.  These visions were concerning enough to him that he wanted to commit suicide.  He had a plan of rolling his wheelchair to a train track but had limited means to do so.  He continues to have the visions constantly including at the time of our encounter.  He states that he is unable to see the right half of his visual fields.  He denies any speech abnormalities nor any focal weaknesses or paresthesias.  He currently endorses passive suicidal ideation because he is "sick people" and at the same time states that now that he knows he had a stroke he is less likely to commit suicide.  Patient has not consistently taken any medications for over a year due to financial barriers.  He just moved into a new house with a friend and started receiving disability checks so plans to restart his previous medications and seek medical attention.  He smokes 1 to 1.5 packs of cigarettes per day.  He states that he drinks only on holidays for the most part and had multiple shots and glasses of wine  today as well as multiple beers yesterday.  Denies any history of alcohol withdrawal.  Denies any illicit drug use.  Endorses that he has received inpatient psychiatric care previously in [place]. 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 Associated edema  without significant mass effect.    Assessment / Plan / Recommendation  Clinical Impression  Pt seen for bedside swallow assessment in the setting of concern for acute CVA. Pt seen with trials of thin liquids (via cup) and regular solids. Pt able to self feed all trials. No overt or subtle s/sx pharyngeal dysphagia noted. No change to vocal quality across trials. Vitals stable for duration of trials. Oral phase grossly intact- with complete manipulation and clearance of regular solid from oral cavity. Pt reports globus sensation and long standing hx of reflux. Education shared regarding esophageal precautions (elevated HOB during/after PO intake) and food selection for increased comfort. Pt reported avoiding challenging food items (steak). Based on age and reported esophageal concerns, pt is at increased risk of aspiration during/after intake, therefore recommend aspiration/esophageal precautions (slow rate, small bites, elevated HOB, and alert for PO intake). Recommend unrestricted diet. MD and RN aware of recommendations.  Regarding cognition- pt oriented, following commands, and attending to assessment. Pt denied cognitive communication changes. Family/friend not present for verification. Pt reports only concern is visual changes.   No further acute SLP services indicated.   SLP Visit Diagnosis: Dysphagia, unspecified (R13.10) (potential esophageal component)    Aspiration Risk  Mild aspiration risk    Diet Recommendation   Age appropriate regular;Thin  Medication Administration: Whole meds with liquid    Other  Recommendations Recommended Consults: Consider GI evaluation Oral Care Recommendations: Oral care BID     Recommendations for follow up therapy are one component of a multi-disciplinary discharge planning process, led by the attending physician.  Recommendations may be updated based on patient status, additional functional criteria and insurance authorization.  Follow up Recommendations No SLP follow up      Assistance Recommended at Discharge  Intermittent supervision for compliance with compensatory strategies  Functional Status Assessment Patient has not had a recent decline in their functional status    Swallow Study   General Date of Onset: 10/07/23 HPI: Timothy [name]. is a 63 y.o. male with a PMH significant for R BKA, tobacco use, type 2 diabetes, HTN, HLD, COPD, PTSD, bipolar disorder, anxiety.  At baseline, they live with a friend and are mostly self-sufficient for ADLs. Wheelchair bound.     They presented from home to the ED on 10/07/2023 with SI and visual changes since the beginning of the month.  Patient describes sudden onset of hallucinations such as black-and-white children holding hands and walking from 1 tree to another, dark black blankets flying towards him, etc.  These visions were concerning enough to him that he wanted to commit suicide.  He had a plan of rolling his wheelchair to a train track but had limited means to do so.  He continues to have the visions constantly including at the time of  our encounter.  He states that he is unable to see the right half of his visual fields.  He denies any speech abnormalities nor any focal weaknesses or paresthesias.  He currently endorses passive suicidal ideation because he is "sick people" and at the same time states that now that he knows he had a stroke he is less likely to commit suicide.  Patient has not consistently taken any medications for over a year due to financial barriers.  He just moved into a new house with a friend and started receiving disability checks so plans to restart his previous medications and seek medical  attention.  He smokes 1 to 1.5 packs of cigarettes per day.  He states that he drinks only on holidays for the most part and had multiple shots and glasses of wine today as well as multiple beers yesterday.  Denies any history of alcohol withdrawal.  Denies any illicit drug use.  Endorses that he has received inpatient psychiatric care previously in [place]. 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 Associated edema  without significant mass effect. Type of Study: Bedside Swallow Evaluation Previous Swallow Assessment: none in chart Diet Prior to this Study: NPO Temperature Spikes Noted: No Respiratory Status: Room air History of Recent Intubation: No Behavior/Cognition: Alert;Cooperative Oral Cavity Assessment: Within Functional Limits Oral Care Completed by SLP: Recent completion by staff Oral Cavity - Dentition: Missing dentition;Poor condition Vision: Functional for self-feeding Self-Feeding Abilities: Able to feed self Patient Positioning: Upright in bed Baseline Vocal Quality: Normal Volitional Cough: Strong    Oral/Motor/Sensory Function Overall Oral Motor/Sensory Function: Within functional limits   Ice Chips Ice chips: Not tested   Thin Liquid Thin Liquid: Within functional limits Presentation: Cup;Self Fed    Nectar Thick Nectar Thick Liquid: Not tested   Honey Thick Honey Thick Liquid: Not tested   Puree Puree: Not tested   Solid     Solid: Within functional limits Presentation: Self Fed     Timothy Lenward Able Clapp, MS, CCC-SLP Speech Language Pathologist Rehab Services; [facility] [phone] (ascom)   Timothy J [name] 10/07/2023,1:47 PM

## 2023-10-07 NOTE — Consult Note (Signed)
 WOC Nurse Consult Note: Reason for Consult: left ankle, followed by podiatry outpatient, originally from trauma from shoe strap in completley insensate foot Last seen 09/24/23 in Dr. Levander Re office Wound type: neuropathic Pressure Injury POA: NA Measurement: see nursing flow sheets Wound bed: image pending Drainage (amount, consistency, odor) see nursing flow sheets Periwound: Dressing procedure/placement/frequency: Podiatry has patient using silicone foam, changing 3x wk.   Will update orders after review of wound photo, requested    Addysen Louth [facility], CNS, CWON-AP [phone]

## 2023-10-07 NOTE — ED Notes (Signed)
 Pt transported to CT with primary RN and with Lovena Rubinstein, CT technician. Pt ABCs intact. RR even and unlabored. Pt in NAD during transport. Pt able to slide himself over to CT bed and back with minimal effort.

## 2023-10-07 NOTE — Consult Note (Signed)
 Please note that the [facility] nursing team is utilizing a standardized work plan to manage patient consults. We are triaging consults and will try to see the patients within 48 hours. Wound photos in the patient's chart allow [place] to consult on the patient in the most efficient and timely manner.    Guneet Delpino [facility], CNS, [employer] [phone]

## 2023-10-07 NOTE — ED Triage Notes (Addendum)
 Pt arrives via POV with CC of suicidal thoughts that have been ongoing but worse today - has been drinking alcohol. Denies plan - states he is unable to carry out any type of plan due to being wheelchair bound. Difficulty getting prescribed medications. Lives with someone he has known for 20+ years. A&Ox4. NAD at this time.

## 2023-10-07 NOTE — Consult Note (Signed)
 NEUROLOGY CONSULT NOTE   Date of service: October 07, 2023 Patient Name: Timothy [name]. MRN:  [id number] DOB:  12-28-1960 Chief Complaint: "vision change" Requesting Provider: Mickle Albe, MD  History of Present Illness  Timothy [name]. is a 63 y.o. male with hx of hypertension, hyperlipidemia, diabetes who presents with vision changes that have been going on for more than 3 weeks.  He states that it happened towards the end of March but he is just being checked out for now.  He actually came in complaining of psychiatric concerns, but mentioned the visual change while he was here.  Due to this, an MRI was obtained which demonstrates a left PCA territory infarct  LKW: Late March IV Thrombolysis: No, outside of window EVT: No, outside of window NIHSS components Score: Comment  1a Level of Conscious 0[x]  1[]  2[]  3[]      1b LOC Questions 0[x]  1[]  2[]       1c LOC Commands 0[x]  1[]  2[]       2 Best Gaze 0[x]  1[]  2[]       3 Visual 0[]  1[]  2[x]  3[]      4 Facial Palsy 0[x]  1[]  2[]  3[]      5a Motor Arm - left 0[x]  1[]  2[]  3[]  4[]  UN[]    5b Motor Arm - Right 0[x]  1[]  2[]  3[]  4[]  UN[]    6a Motor Leg - Left 0[x]  1[]  2[]  3[]  4[]  UN[]    6b Motor Leg - Right 0[x]  1[]  2[]  3[]  4[]  UN[]    7 Limb Ataxia 0[x]  1[]  2[]  3[]  UN[]     8 Sensory 0[x]  1[]  2[]  UN[]      9 Best Language 0[x]  1[]  2[]  3[]      10 Dysarthria 0[x]  1[]  2[]  UN[]      11 Extinct. and Inattention 0[x]  1[]  2[]       TOTAL: 2       Past History   Past Medical History:  Diagnosis Date   Anxiety    COPD (chronic obstructive pulmonary disease) (HCC)    Depression    Diabetes mellitus without complication (HCC)    GERD (gastroesophageal reflux disease)    Hypercholesteremia    Hypertension    Pneumonia    PTSD (post-traumatic stress disorder)     Past Surgical History:  Procedure Laterality Date   AMPUTATION Left 03/22/2020   Procedure: AMPUTATION RAY-1st Ray Left Foot;  Surgeon: Angel Barba, [name];  Location: [facility];   Service: Podiatry;  Laterality: Left;   AMPUTATION Right 11/07/2022   Procedure: AMPUTATION BELOW KNEE;  Surgeon: Celso College, MD;  Location: [facility];  Service: Vascular;  Laterality: Right;   AMPUTATION TOE Left 03/16/2020   Procedure: AMPUTATION GREAT TOE;  Surgeon: Angel Barba, [name];  Location: [facility];  Service: Podiatry;  Laterality: Left;   AMPUTATION TOE Left 04/06/2022   Procedure: AMPUTATION TOE;  Surgeon: Angel Barba, [name];  Location: [facility];  Service: Podiatry;  Laterality: Left;   COLONOSCOPY WITH PROPOFOL  N/A 12/22/2019   Procedure: COLONOSCOPY WITH PROPOFOL ;  Surgeon: Marnee Sink, MD;  Location: [facility];  Service: Endoscopy;  Laterality: N/A;   INCISION AND DRAINAGE ABSCESS Left 08/11/2021   Procedure: INCISION AND DRAINAGE ABSCESS;  Surgeon: Lesly Raspberry, MD;  Location: [facility];  Service: ENT;  Laterality: Left;   LOWER EXTREMITY ANGIOGRAPHY Left 03/18/2020   Procedure: Lower Extremity Angiography;  Surgeon: Jackquelyn Mass, MD;  Location: [facility];  Service: Cardiovascular;  Laterality: Left;   LOWER EXTREMITY ANGIOGRAPHY Right 11/05/2022   Procedure:  Lower Extremity Angiography;  Surgeon: Celso College, MD;  Location: [facility];  Service: Cardiovascular;  Laterality: Right;   TONSILLECTOMY     VIDEO ASSISTED THORACOSCOPY (VATS)/DECORTICATION Left 08/15/2020   Procedure: VIDEO ASSISTED THORACOSCOPY (VATS)/DECORTICATION;  Surgeon: Hilarie Lovely, MD;  Location: [facility];  Service: Thoracic;  Laterality: Left;   WISDOM TOOTH EXTRACTION      Family History: Family History  Family history unknown: Yes    Social History  reports that he has been smoking cigarettes. He has a 41 pack-year smoking history. He has never used smokeless tobacco. He reports current alcohol use of about 6.0 standard drinks of alcohol per week. He reports that he does not currently use drugs.  Allergies  Allergen Reactions   Bee Venom Anaphylaxis   Cheese  Anaphylaxis, Swelling and Other (See Comments)    Blue Cheese only   Coffea Arabica Shortness Of Breath   Coffee Flavoring Agent (Non-Screening) Shortness Of Breath    Medications   Current Facility-Administered Medications:    acetaminophen  (TYLENOL ) tablet 650 mg, 650 mg, Oral, Q6H PRN **OR** acetaminophen  (TYLENOL ) suppository 650 mg, 650 mg, Rectal, Q6H PRN, Ree Candy, MD   albuterol  (PROVENTIL ) (2.5 MG/3ML) 0.083% nebulizer solution 2.5 mg, 2.5 mg, Nebulization, Q2H PRN, Ree Candy, MD   [START ON 10/08/2023] aspirin  EC tablet 81 mg, 81 mg, Oral, Daily, Ree Candy, MD   atorvastatin  (LIPITOR) tablet 80 mg, 80 mg, Oral, Daily, Sira, Zackery, MD, 80 mg at 10/07/23 1231   bisacodyl  (DULCOLAX) EC tablet 5 mg, 5 mg, Oral, Daily PRN, Ree Candy, MD   enoxaparin  (LOVENOX ) injection 40 mg, 40 mg, Subcutaneous, Q24H, Belue, Nathan S, RPH   fluticasone  furoate-vilanterol (BREO ELLIPTA ) 100-25 MCG/ACT 1 puff, 1 puff, Inhalation, Daily, Ree Candy, MD, 1 puff at 10/07/23 1610   gabapentin  (NEURONTIN ) capsule 800 mg, 800 mg, Oral, TID, Ree Candy, MD, 800 mg at 10/07/23 9604   insulin  aspart (novoLOG ) injection 0-15 Units, 0-15 Units, Subcutaneous, TID WC, Ree Candy, MD, 2 Units at 10/07/23 1231   [START ON 10/08/2023] insulin  glargine-yfgn (SEMGLEE ) injection 5 Units, 5 Units, Subcutaneous, Daily, Ree Candy, MD   metoprolol  succinate (TOPROL -XL) 24 hr tablet 50 mg, 50 mg, Oral, Daily, Ree Candy, MD, 50 mg at 10/07/23 0930   nicotine  (NICODERM CQ  - dosed in mg/24 hours) patch 21 mg, 21 mg, Transdermal, Daily, Ree Candy, MD, 21 mg at 10/07/23 5409   OLANZapine  (ZYPREXA ) tablet 10 mg, 10 mg, Oral, TID PRN, Sira, Zackery, MD   ondansetron  (ZOFRAN ) tablet 4 mg, 4 mg, Oral, Q6H PRN **OR** ondansetron  (ZOFRAN ) injection 4 mg, 4 mg, Intravenous, Q6H PRN, Ree Candy, MD   [START ON 10/08/2023] pantoprazole   (PROTONIX ) EC tablet 40 mg, 40 mg, Oral, Daily, Sira, Zackery, MD   polyethylene glycol (MIRALAX  / GLYCOLAX ) packet 17 g, 17 g, Oral, Daily, Ree Candy, MD   venlafaxine  XR (EFFEXOR -XR) 24 hr capsule 225 mg, 225 mg, Oral, Q breakfast, Ree Candy, MD, 225 mg at 10/07/23 8119  Current Outpatient Medications:    acetaminophen  (TYLENOL ) 650 MG CR tablet, Take 1,300 mg by mouth every 8 (eight) hours as needed for pain., Disp: , Rfl:    gabapentin  (NEURONTIN ) 800 MG tablet, TAKE 1 TABLET BY MOUTH THREE TIMES A DAY, Disp: 90 tablet, Rfl: 2   metoprolol  succinate (TOPROL -XL) 50 MG 24 hr tablet, Take 50 mg by mouth daily., Disp: , Rfl:  pantoprazole  (PROTONIX ) 40 MG tablet, TAKE ONE TABLET (40MG  TOTAL) BY MOUTH ONCE DAILY. (Patient taking differently: Take 40 mg by mouth daily.), Disp: 30 tablet, Rfl: 1   apixaban  (ELIQUIS ) 5 MG TABS tablet, Take 5 mg by mouth 2 (two) times daily. (Patient not taking: Reported on 10/07/2023), Disp: , Rfl:    aspirin  EC 81 MG tablet, Take 1 tablet (81 mg total) by mouth daily. Swallow whole. (Patient not taking: Reported on 12/10/2022), Disp: 30 tablet, Rfl: 0   blood glucose meter kit and supplies KIT, Dispense based on patient and insurance preference. Use up to four times daily as directed. (FOR ICD-9 250.00, 250.01)., Disp: 1 each, Rfl: 0   cloNIDine  (CATAPRES ) 0.1 MG tablet, Take 1 tablet (0.1 mg total) by mouth at bedtime. (Patient not taking: Reported on 11/08/2022), Disp: 30 tablet, Rfl: 2   clopidogrel  (PLAVIX ) 75 MG tablet, Take 1 tablet (75 mg total) by mouth daily. (Patient not taking: Reported on 12/10/2022), Disp: 30 tablet, Rfl: 0   cyanocobalamin  (VITAMIN B12) 1000 MCG tablet, Take 1 tablet (1,000 mcg total) by mouth daily. (Patient not taking: Reported on 10/07/2023), Disp: 30 tablet, Rfl: 0   insulin  glargine (LANTUS ) 100 UNIT/ML Solostar Pen, Inject 25 Units into the skin at bedtime. (Patient not taking: Reported on 10/07/2023), Disp: 15 mL,  Rfl: 0   Insulin  Pen Needle (PEN NEEDLES) 32G X 6 MM MISC, 1 each by Does not apply route daily., Disp: 30 each, Rfl: 0   metFORMIN  (GLUCOPHAGE ) 1000 MG tablet, TAKE ONE TABLET (1,000MG  TOTAL) BY MOUTH TWICE DAILY. (Patient not taking: Reported on 10/07/2023), Disp: 60 tablet, Rfl: 1   mometasone -formoterol  (DULERA ) 100-5 MCG/ACT AERO, Inhale 2 puffs into the lungs 2 (two) times daily. (Patient not taking: Reported on 10/07/2023), Disp: 1 each, Rfl: 0   rosuvastatin  (CRESTOR ) 5 MG tablet, TAKE ONE TABLET (5MG  TOTAL) BY MOUTH ONCE DAILY. (Patient not taking: Reported on 10/07/2023), Disp: 30 tablet, Rfl: 0   sulfamethoxazole -trimethoprim  (BACTRIM  DS) 800-160 MG tablet, Take 1 tablet by mouth 2 (two) times daily. (Patient not taking: Reported on 10/07/2023), Disp: 28 tablet, Rfl: 0   venlafaxine  XR (EFFEXOR  XR) 150 MG 24 hr capsule, Take 1 capsule (150 mg total) by mouth daily with breakfast. Patient to take medication with 75 mg tablet for (225 mg total). (Patient not taking: Reported on 10/07/2023), Disp: 30 capsule, Rfl: 2   venlafaxine  XR (EFFEXOR  XR) 75 MG 24 hr capsule, Take 1 capsule (75 mg total) by mouth daily with breakfast. Take with one capsule daily with 150 mg capsule for total dose of 225 mg daily (Patient not taking: Reported on 10/07/2023), Disp: 30 capsule, Rfl: 1  Vitals   Vitals:   10/07/23 1115 10/07/23 1130 10/07/23 1204 10/07/23 1330  BP:   (!) 145/77 120/78  Pulse: 71 87 65 76  Resp: 17 16 16 16   Temp:   98.7 F (37.1 C)   TempSrc:   Oral   SpO2: 100% 94% 96% 98%  Weight:      Height:        Body mass index is 25.83 kg/m.  Physical Exam   Constitutional: Appears well-developed and well-nourished.  Neurologic Examination    Neuro: Mental Status: Patient is awake, alert, oriented to person, place, month, year, and situation. Patient is able to give a clear and coherent history. No signs of aphasia or neglect Cranial Nerves: II: Right hemianopia. Pupils are equal,  round, and reactive to light.   III,IV, VI: EOMI  without ptosis or diploplia.  V: Facial sensation is symmetric to temperature VII: Facial movement is symmetric.  Motor: Tone is normal. Bulk is normal. 5/5 strength was present in all four extremities. \ Sensory: Sensation is symmetric to light touch and temperature in the arms and legs. Cerebellar: FNF and HKS are intact bilaterally        Labs/Imaging/Neurodiagnostic studies   CBC:  Recent Labs  Lab 23-Oct-2023 0041 10-23-2023 0538  WBC 8.0 6.2  HGB 13.9 13.1  HCT 40.8 37.7*  MCV 94.0 93.3  PLT 243 228   Basic Metabolic Panel:  Lab Results  Component Value Date   NA 137 2023/10/23   K 3.6 10-23-2023   CO2 22 Oct 23, 2023   GLUCOSE 228 (H) 10/23/23   BUN 14 2023/10/23   CREATININE 0.66 10/23/23   CALCIUM  8.4 (L) Oct 23, 2023   GFRNONAA >60 Oct 23, 2023   GFRAA 107 06/22/2020   Lipid Panel:  Lab Results  Component Value Date   LDLCALC UNABLE TO CALCULATE IF TRIGLYCERIDE OVER 400 mg/dL 16/03/9603   [id number]:  Lab Results  Component Value Date   HGBA1C 9.0 (H) Oct 23, 2023   Urine Drug Screen:     Component Value Date/Time   LABOPIA NONE DETECTED 2023/10/23 0225   LABOPIA NONE DETECTED 12/18/2020 1023   COCAINSCRNUR NONE DETECTED 10-23-23 0225   LABBENZ NONE DETECTED 10-23-23 0225   LABBENZ NONE DETECTED 12/18/2020 1023   AMPHETMU NONE DETECTED 10-23-2023 0225   AMPHETMU NONE DETECTED 12/18/2020 1023   THCU NONE DETECTED 10-23-2023 0225   THCU NONE DETECTED 12/18/2020 1023   LABBARB NONE DETECTED 10/23/23 0225   LABBARB NONE DETECTED 12/18/2020 1023    Alcohol Level     Component Value Date/Time   ETH 182 (H) 23-Oct-2023 0041   INR  Lab Results  Component Value Date   INR 1.0 2023-10-23    MRI Brain(Personally reviewed): Subacute appearing stroke in the left PCA territory   ASSESSMENT   Timothy [name]. is a 63 y.o. male with a history of multiple stroke risk factors who presents with a  PCA territory stroke.  This can be a thrombotic or embolic.  He will need further workup with vascular imaging and modification of his stroke risk factors including hyperlipidemia.  He has been loaded with aspirin  and Plavix , but no need to continue the Plavix  as he is already more than 3 weeks from onset.  RECOMMENDATIONS  Agree with high intensity statin ASA 81 mg daily Echo, telemetry CTA head and neck OT, ST ______________________________________________________________________    Signed, Ann Keto, MD [facility]

## 2023-10-07 NOTE — ED Notes (Signed)
 This RN was caring for this patient by connecting him to the monitor.  Pt asked RN if RN is married and RN stated "yes."  Pt then asked if pt was married to a wife or a husband and RN stated "a wife."  Pt the made the statement, "that's too bad I would really like to suck your dick."  RN informed pt that he cannot make statements like that to staff in this hospital.  Pt then stated, "oh that's right, we're in the hospital."

## 2023-10-07 NOTE — ED Notes (Signed)
 Admit medically vol

## 2023-10-07 NOTE — Evaluation (Signed)
 Physical Therapy Evaluation Patient Details Name: Timothy [name]. MRN: [id number] DOB: 1961-01-09 Today's Date: 10/07/2023  History of Present Illness  Pt is a 63 y.o. 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 Presented with SI and visual changes/hallucinations since the beginning of the month. PMH significant for R BKA, tobacco use, type 2 diabetes, HTN, HLD, COPD, PTSD, bipolar disorder, anxiety.  Clinical Impression  Pt admitted with above diagnosis. Pt currently with functional limitations due to the deficits listed below (see PT Problem List). Pt received sleeping in ED cot agreeable to PT/OT co-eval. Pt with mild vision deficits and reports mild RUE and LE deficits from CVA but not enough to be evident via MMT and functional mobility. Pt displaying independence with bed mobility and multiple SPT without AD from bed <> w/c <> toilet <> w/c <> bed. PRN reminders to lock brakes upon transfer back to bed but does not comply. Educated on importance of locking brakes to reduce falls risk and injury. Otherwise pt is at baseline with no acute PT needs. Did provide education on importance of OOB mobility to regain strength to PLOF. Pt understanding. PT to sign off in house.      If plan is discharge home, recommend the following: Assist for transportation   Can travel by private vehicle        Equipment Recommendations    Recommendations for Other Services       Functional Status Assessment Patient has not had a recent decline in their functional status     Precautions / Restrictions Precautions Precautions: Fall Restrictions Weight Bearing Restrictions Per Provider Order: No      Mobility  Bed Mobility Overal bed mobility: Modified Independent               Patient Response: Cooperative  Transfers Overall transfer level: Modified independent                 General transfer  comment: MOD I/SUP for SPT to bed>W/C>toilet>w/c>bed; cueing to lock W/C brakes prior to transfers to prevent falls    Ambulation/Gait                  Stairs            Wheelchair Mobility     Tilt Bed Tilt Bed Patient Response: Cooperative  Modified Rankin (Stroke Patients Only)       Balance Overall balance assessment: Mild deficits observed, not formally tested                                           Pertinent Vitals/Pain Pain Assessment Pain Assessment: Faces Faces Pain Scale: Hurts a little bit Pain Location: R BKA/phantom pains Pain Intervention(s): Monitored during session    Home Living Family/patient expects to be discharged to:: Private residence Living Arrangements: Non-relatives/Friends Available Help at Discharge: Friend(s) Type of Home: House Home Access: Ramped entrance       Home Layout: One level Home Equipment: Grab bars - toilet;Grab bars - tub/shower;Wheelchair - manual      Prior Function Prior Level of Function : History of Falls (last six months)             Mobility Comments: 7-8 falls in the last 6 months; mostly W/C bound, can hop 3-4 hops; reports mult W/C in the home  where he has the hop through dog/baby gates room to room to get to the next W/C ADLs Comments: IND/MOD I; gets down in the tub for bathing     Extremity/Trunk Assessment   Upper Extremity Assessment Upper Extremity Assessment: Overall WFL for tasks assessed    Lower Extremity Assessment Lower Extremity Assessment: RLE deficits/detail RLE Deficits / Details: R BKA    Cervical / Trunk Assessment Cervical / Trunk Assessment: Normal  Communication   Communication Communication: No apparent difficulties    Cognition Arousal: Alert Behavior During Therapy: WFL for tasks assessed/performed   PT - Cognitive impairments: No apparent impairments                         Following commands: Intact       Cueing  Cueing Techniques: Verbal cues     General Comments      Exercises Other Exercises Other Exercises: importance of continuous mobility to return strength to RUE. (Weakness not significant enough to be evident with functional mobility). Use of w/c brakes to prevent falls   Assessment/Plan    PT Assessment Patient does not need any further PT services  PT Problem List         PT Treatment Interventions      PT Goals (Current goals can be found in the Care Plan section)  Acute Rehab PT Goals PT Goal Formulation: All assessment and education complete, DC therapy    Frequency       Co-evaluation PT/OT/SLP Co-Evaluation/Treatment: Yes Reason for Co-Treatment: To address functional/ADL transfers;Necessary to address cognition/behavior during functional activity;For patient/therapist safety PT goals addressed during session: Mobility/safety with mobility OT goals addressed during session: ADL's and self-care       AM-PAC PT "6 Clicks" Mobility  Outcome Measure Help needed turning from your back to your side while in a flat bed without using bedrails?: None Help needed moving from lying on your back to sitting on the side of a flat bed without using bedrails?: None Help needed moving to and from a bed to a chair (including a wheelchair)?: A Little Help needed standing up from a chair using your arms (e.g., wheelchair or bedside chair)?: A Little Help needed to walk in hospital room?: A Little Help needed climbing 3-5 steps with a railing? : Total 6 Click Score: 18    End of Session   Activity Tolerance: Patient tolerated treatment well Patient left: in bed;with call bell/phone within reach Nurse Communication: Mobility status PT Visit Diagnosis: Other abnormalities of gait and mobility (R26.89)    Time: 1357-1410 PT Time Calculation (min) (ACUTE ONLY): 13 min   Charges:   PT Evaluation $PT Eval Low Complexity: 1 Low   PT General Charges $$ ACUTE PT VISIT: 1 Visit          Marc Senior. Fairly IV, PT, DPT Physical Therapist- [place]  [facility]  10/07/2023, 3:30 PM

## 2023-10-07 NOTE — H&P (Signed)
 History and Physical    Timothy [name]. ZOX:[id number] DOB: Oct 28, 1960 DOA: 10/07/2023 PCP: Rory Collard, MD  Chief Complaint: SI, vision changes Historian: patient  HPI:  Timothy [name]. is a 63 y.o. male with a PMH significant for R BKA, tobacco use, type 2 diabetes, HTN, HLD, COPD, PTSD, bipolar disorder, anxiety. At baseline, they live with a friend and are mostly self-sufficient for ADLs. Wheelchair bound.  They presented from home to the ED on 10/07/2023 with SI and visual changes since the beginning of the month.  Patient describes sudden onset of hallucinations such as black-and-white children holding hands and walking from 1 tree to another, dark black blankets flying towards him, etc.  These visions were concerning enough to him that he wanted to commit suicide.  He had a plan of rolling his wheelchair to a train track but had limited means to do so.  He continues to have the visions constantly including at the time of our encounter.  He states that he is unable to see the right half of his visual fields.  He denies any speech abnormalities nor any focal weaknesses or paresthesias. He currently endorses passive suicidal ideation because he is "sick people" and at the same time states that now that he knows he had a stroke he is less likely to commit suicide. Patient has not consistently taken any medications for over a year due to financial barriers.  He just moved into a new house with a friend and started receiving disability checks so plans to restart his previous medications and seek medical attention. He smokes 1 to 1.5 packs of cigarettes per day.  He states that he drinks only on holidays for the most part and had multiple shots and glasses of wine today as well as multiple beers yesterday.  Denies any history of alcohol withdrawal.  Denies any illicit drug use. Endorses that he has received inpatient psychiatric care previously in [place].  In the ED, it was found  that they had stable vital signs.  Significant findings included: Na+ 136, K+ 3.9, glucose 306.  EtOH 182.  Negative salicylate and acetaminophen  levels.  Unremarkable CBC.  UDS negative. 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  They were initially treated with aspirin  325 mg.  Psychiatry was consulted by EDP.  Patient was admitted to medicine service for further workup and management of acute CVA, SI as outlined in detail below.  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 - admit to tele floor - Frequent neurochecks - Echo, lipids, A1c, TSH - Please consult neurology in a.m. as appropriate - PT/OT/SLP - statin - aspirin , plavix   SI-patient continues to endorse suicidal ideations at this time. - Psychiatry consulted by EDP - TOC consult for supportive care with financial, housing insecurities  Tobacco use- - Nicotine  patch daily  Type 2 diabetes-hyperglycemia on presentation.  Has not been taking any medications for about a year, per patient. - Sliding scale insulin   HTN  HLD-  - Titrate back on home medications.  Metoprolol , rosuvastatin , aspirin  started in ED  Bipolar PTSD  anxiety - Restart home gabapentin , venlafaxine  - Await further recommendations by psychiatry  Past Medical History:  Diagnosis Date   Anxiety    COPD (chronic obstructive pulmonary disease) (HCC)    Depression  Diabetes mellitus without complication (HCC)    GERD (gastroesophageal reflux disease)    Hypercholesteremia    Hypertension    Pneumonia    PTSD (post-traumatic stress disorder)     Past Surgical History:  Procedure Laterality Date   AMPUTATION Left 03/22/2020   Procedure: AMPUTATION RAY-1st Ray Left Foot;  Surgeon: Angel Barba, [name];   Location: [facility] ORS;  Service: Podiatry;  Laterality: Left;   AMPUTATION Right 11/07/2022   Procedure: AMPUTATION BELOW KNEE;  Surgeon: Celso College, MD;  Location: [facility] ORS;  Service: Vascular;  Laterality: Right;   AMPUTATION TOE Left 03/16/2020   Procedure: AMPUTATION GREAT TOE;  Surgeon: Angel Barba, [name];  Location: [facility] ORS;  Service: Podiatry;  Laterality: Left;   AMPUTATION TOE Left 04/06/2022   Procedure: AMPUTATION TOE;  Surgeon: Angel Barba, [name];  Location: [facility] ORS;  Service: Podiatry;  Laterality: Left;   COLONOSCOPY WITH PROPOFOL  N/A 12/22/2019   Procedure: COLONOSCOPY WITH PROPOFOL ;  Surgeon: Marnee Sink, MD;  Location: [facility] ENDOSCOPY;  Service: Endoscopy;  Laterality: N/A;   INCISION AND DRAINAGE ABSCESS Left 08/11/2021   Procedure: INCISION AND DRAINAGE ABSCESS;  Surgeon: Lesly Raspberry, MD;  Location: [facility] ORS;  Service: ENT;  Laterality: Left;   LOWER EXTREMITY ANGIOGRAPHY Left 03/18/2020   Procedure: Lower Extremity Angiography;  Surgeon: Jackquelyn Mass, MD;  Location: [facility] INVASIVE CV LAB;  Service: Cardiovascular;  Laterality: Left;   LOWER EXTREMITY ANGIOGRAPHY Right 11/05/2022   Procedure: Lower Extremity Angiography;  Surgeon: Celso College, MD;  Location: [facility] INVASIVE CV LAB;  Service: Cardiovascular;  Laterality: Right;   TONSILLECTOMY     VIDEO ASSISTED THORACOSCOPY (VATS)/DECORTICATION Left 08/15/2020   Procedure: VIDEO ASSISTED THORACOSCOPY (VATS)/DECORTICATION;  Surgeon: Hilarie Lovely, MD;  Location: [facility] OR;  Service: Thoracic;  Laterality: Left;   WISDOM TOOTH EXTRACTION       reports that he has been smoking cigarettes. He has a 41 pack-year smoking history. He has never used smokeless tobacco. He reports current alcohol use of about 6.0 standard drinks of alcohol per week. He reports that he does not currently use drugs.  Allergies  Allergen Reactions   Bee Venom Anaphylaxis   Cheese Anaphylaxis, Swelling and Other (See Comments)    Blue Cheese only    Coffea Arabica Shortness Of Breath   Coffee Flavoring Agent (Non-Screening) Shortness Of Breath    Family History  Family history unknown: Yes    Prior to Admission medications   Medication Sig Start Date End Date Taking? Authorizing Provider  acetaminophen  (TYLENOL ) 650 MG CR tablet Take 1,300 mg by mouth every 8 (eight) hours as needed for pain.    [provider]  apixaban  (ELIQUIS ) 5 MG TABS tablet Take 5 mg by mouth 2 (two) times daily.    [provider]  aspirin  EC 81 MG tablet Take 1 tablet (81 mg total) by mouth daily. Swallow whole. Patient not taking: Reported on 12/10/2022 11/25/22   Donaciano Frizzle, MD  blood glucose meter kit and supplies KIT Dispense based on patient and insurance preference. Use up to four times daily as directed. (FOR ICD-9 250.00, 250.01). 10/15/19   Iloabachie, Chioma E, NP  cloNIDine  (CATAPRES ) 0.1 MG tablet Take 1 tablet (0.1 mg total) by mouth at bedtime. Patient not taking: Reported on 11/08/2022 08/10/22 11/08/22  Nwoko, Uchenna E, PA  clopidogrel  (PLAVIX ) 75 MG tablet Take 1 tablet (75 mg total) by mouth daily. Patient not taking: Reported on 12/10/2022 11/25/22   Donaciano Frizzle, MD  cyanocobalamin  (  VITAMIN B12) 1000 MCG tablet Take 1 tablet (1,000 mcg total) by mouth daily. 11/24/22   Donaciano Frizzle, MD  gabapentin  (NEURONTIN ) 800 MG tablet TAKE 1 TABLET BY MOUTH THREE TIMES A DAY 05/10/23   Brown, Fallon E, NP  insulin  glargine (LANTUS ) 100 UNIT/ML Solostar Pen Inject 25 Units into the skin at bedtime. 11/24/22   Donaciano Frizzle, MD  Insulin  Pen Needle (PEN NEEDLES) 32G X 6 MM MISC 1 each by Does not apply route daily. 11/24/22   Donaciano Frizzle, MD  metFORMIN  (GLUCOPHAGE ) 1000 MG tablet TAKE ONE TABLET (1,000MG  TOTAL) BY MOUTH TWICE DAILY. 01/16/21     metoprolol  succinate (TOPROL -XL) 50 MG 24 hr tablet Take 50 mg by mouth daily. 07/21/21   [provider]  mometasone -formoterol  (DULERA ) 100-5 MCG/ACT AERO Inhale 2 puffs into the lungs 2 (two) times  daily. 11/24/22   Donaciano Frizzle, MD  pantoprazole  (PROTONIX ) 40 MG tablet TAKE ONE TABLET (40MG  TOTAL) BY MOUTH ONCE DAILY. Patient taking differently: Take 40 mg by mouth daily. 01/16/21     rosuvastatin  (CRESTOR ) 5 MG tablet TAKE ONE TABLET (5MG  TOTAL) BY MOUTH ONCE DAILY. 11/24/22   Donaciano Frizzle, MD  sulfamethoxazole -trimethoprim  (BACTRIM  DS) 800-160 MG tablet Take 1 tablet by mouth 2 (two) times daily. 01/04/23   Brown, Fallon E, NP  venlafaxine  XR (EFFEXOR  XR) 150 MG 24 hr capsule Take 1 capsule (150 mg total) by mouth daily with breakfast. Patient to take medication with 75 mg tablet for (225 mg total). 01/04/23 04/04/23  Brown, Fallon E, NP  venlafaxine  XR (EFFEXOR  XR) 75 MG 24 hr capsule Take 1 capsule (75 mg total) by mouth daily with breakfast. Take with one capsule daily with 150 mg capsule for total dose of 225 mg daily 01/04/23   Brown, Fallon E, NP  citalopram  (CELEXA ) 20 MG tablet Take 1 tablet (20 mg total) by mouth daily. Patient taking differently: Take 20 mg by mouth at bedtime. 11/09/20 01/17/21  Iloabachie, Chioma E, NP   I have personally, briefly reviewed patient's prior medical records in [facility]  Objective: Blood pressure 110/67, pulse 70, temperature 98.6 F (37 C), temperature source Oral, resp. rate 19, height 5\' 10"  (1.778 m), weight 81.6 kg, SpO2 97%.   Constitutional: NAD, calm, comfortable HEENT: lids and conjunctivae normal. MMM. Posterior pharynx clear of any exudate or lesions. Poor dentition.  Respiratory: CTAB, no wheezing, no crackles. Normal respiratory effort. No accessory muscle use.  Cardiovascular: RRR, no murmurs / rubs / gallops. No extremity edema. 2+ pedal pulses. no clubbing / cyanosis.  Abdomen: soft, NT, ND, no masses or HSM palpated. Musculoskeletal: No joint deformity upper and lower extremities with exception of right BKA Skin: dry, intact, normal color, normal temperature on exposed skin Neurologic: Alert and oriented x 3. Normal speech.  Grossly non-focal exam. PERRL Psychiatric: Normal mood. Congruent affect.  Labs on Admission: I have personally reviewed admission labs and imaging studies  CBC    Component Value Date/Time   WBC 8.0 10/07/2023 0041   RBC 4.34 10/07/2023 0041   HGB 13.9 10/07/2023 0041   HGB 14.6 06/22/2020 1058   HCT 40.8 10/07/2023 0041   HCT 41.8 06/22/2020 1058   PLT 243 10/07/2023 0041   PLT 301 06/22/2020 1058   MCV 94.0 10/07/2023 0041   MCV 96 06/22/2020 1058   MCH 32.0 10/07/2023 0041   MCHC 34.1 10/07/2023 0041   RDW 13.9 10/07/2023 0041   RDW 13.1 06/22/2020 1058   LYMPHSABS 1.7 11/14/2022 0717  LYMPHSABS 1.2 06/22/2020 1058   MONOABS 0.8 11/14/2022 0717   EOSABS 0.2 11/14/2022 0717   EOSABS 0.1 06/22/2020 1058   BASOSABS 0.1 11/14/2022 0717   BASOSABS 0.0 06/22/2020 1058   CMP     Component Value Date/Time   NA 136 10/07/2023 0041   NA 134 06/22/2020 1058   K 3.9 10/07/2023 0041   CL 103 10/07/2023 0041   CO2 24 10/07/2023 0041   GLUCOSE 306 (H) 10/07/2023 0041   BUN 16 10/07/2023 0041   BUN 8 06/22/2020 1058   CREATININE 0.82 10/07/2023 0041   CALCIUM  8.4 (L) 10/07/2023 0041   PROT 6.9 10/07/2023 0041   PROT 7.3 06/22/2020 1058   ALBUMIN 3.7 10/07/2023 0041   ALBUMIN 4.3 06/22/2020 1058   AST 13 (L) 10/07/2023 0041   ALT 10 10/07/2023 0041   ALKPHOS 57 10/07/2023 0041   BILITOT 0.5 10/07/2023 0041   BILITOT 0.3 06/22/2020 1058   GFRNONAA >60 10/07/2023 0041   GFRAA 107 06/22/2020 1058    Radiological Exams on Admission: MR BRAIN WO CONTRAST Result Date: 10/07/2023 CLINICAL DATA:  Neuro deficit, acute, stroke suspected EXAM: MRI HEAD WITHOUT CONTRAST TECHNIQUE: Multiplanar, multiecho pulse sequences of the brain and surrounding structures were obtained without intravenous contrast. COMPARISON:  CT head May 24, 24. FINDINGS: Brain: Acute or early subacute left PCA territory infarct involving the except all lobes. Associated edema without substantial mass effect.  No evidence of acute hemorrhage, mass lesion, midline shift or hydrocephalus. Vascular: Major arterial flow voids are maintained. Skull and upper cervical spine: Normal marrow signal. Sinuses/Orbits: Clear sinuses.  No acute findings. 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 Associated edema without significant mass effect. Electronically Signed   By: Stevenson Elbe M.D.   On: 10/07/2023 02:04   EKG: Independently reviewed. NSR HR 71  DVT prophylaxis:  lovenox   Code Status: Full Family Communication: none  Disposition Plan: admit for psych, neuro evaluations   Consults called: psych   Ree Candy, DO [facility]  10/07/2023, 4:30 AM    To contact the appropriate [facility] Attending or Consulting provider: Check [email or web address] for coverage from 7pm-7am

## 2023-10-07 NOTE — Consult Note (Signed)
 Timothy [name] Telepsychiatry Consult Note  Patient Name: Timothy [name]. MRN: [id number] DOB: 05-09-1961 DATE OF Consult: 10/07/2023  PRIMARY PSYCHIATRIC DIAGNOSES  1.  MDD, recurrent, severe, without psychotic features 2.  PTSD per history    RECOMMENDATIONS  Recommendations: Medication recommendations: Please reconcile and continue home meds; olanzapine  10mg  po TID PRN agitation Non-Medication/therapeutic recommendations: Psychiatric hospitalization  Is inpatient psychiatric hospitalization recommended for this patient? Yes (Explain why): Suicidal ideation with plan  Follow-Up Telepsychiatry C/L services: We will sign off for now. Please re-consult our service if needed for any concerning changes in the patient's condition, discharge planning, or questions. Communication: Treatment team members (and family members if applicable) who were involved in treatment/care discussions and planning, and with whom we spoke or engaged with via secure text/chat, include the following: Timothy [name], Timothy [name]. Is a 63 year old male with a history of PTSD, major depressive disorder, bipolar disorder, anxiety, diabetes, hypertension, hyperlipidemia, right BKA, COPD who presents to the ED with suicidal ideation. Ethanol 182 at 0041 on 4/21. Chart reviewed. Psychiatry consulted for disposition recommendations. On evaluation, patient initially noted to be irritable and somewhat cooperative and becomes more euthymic and participatory as the evaluation progresses. Patient currently endorses active suicidal ideation with plan to go into the street to get hit by a car, ambivalent about intent. States his suicidal thoughts have been intensifying for a week. Patient endorses depressed mood, anhedonia, hopelessness, worthlessness, poor concentration, insomnia, fatigue. Patient unable to engage in safety planning at the time of evaluation.   Thank you for involving us  in the care of this patient. If you have  any additional questions or concerns, please call [phone] and ask for me or the provider on-call.  TELEPSYCHIATRY ATTESTATION & CONSENT  As the provider for this telehealth consult, I attest that I verified the patient's identity using two separate identifiers, introduced myself to the patient, provided my credentials, disclosed my location, and performed this encounter via a HIPAA-compliant, real-time, face-to-face, two-way, interactive audio and video platform and with the full consent and agreement of the patient (or guardian as applicable.)  Patient physical location: ED in [facility]  Telehealth provider physical location: home office in state of [place]    Video start time: 12:02 PM EST Video end time: 12:27 PM EST   IDENTIFYING DATA  Timothy [name]. is a 63 y.o. year-old male for whom a psychiatric consultation has been ordered by the primary provider. The patient was identified using two separate identifiers.  CHIEF COMPLAINT/REASON FOR CONSULT  Suicidal ideation    HISTORY OF PRESENT ILLNESS (HPI)  Timothy [name], Timothy [name]. Is a 63 year old male with a history of PTSD, major depressive disorder, bipolar disorder, anxiety, diabetes, hypertension, hyperlipidemia, right BKA, COPD who presents to the ED with suicidal ideation. Ethanol 182 at 0041 on 4/21. Chart reviewed. Psychiatry consulted for disposition recommendations.  On evaluation, patient initially noted to be irritable and somewhat cooperative and becomes more euthymic and participatory as the evaluation progresses. Patient reports he is currently housed. Patient reports his landlady is his friend, but states she is difficult. Patient reports he feels stressed due to his housing. He states the people with whom he lives are supposed to help him and they don't. States he just started to get disability due to right BKA. States he pays a lot of money in rent and that's upsetting to him. Patient reports his financial strains  are making him feel more depressed. Patient endorses chronic  depression, "even as a child," states he does not ever recall feeling happy. Patient endorses that he has suicidal thoughts weekly and has since he was a child. Patient currently endorses active suicidal ideation with plan to go into the street to get hit by a car, ambivalent about intent states, "I think I might do it." Endorse access to firearms. States his suicidal thoughts have been intensifying for a week. Patient endorses depressed mood, anhedonia, hopelessness, worthlessness, poor concentration, insomnia, fatigue. Patient reports his husband died 3 years ago. Denies symptoms consistent with mania/hypomania, paranoia, auditory and visual hallucinations, homicidal ideation. Patient reports last night was the first time he drank in a month, does not know how much he drank. States he previously had a problem with alcohol "but I can't stomach it anymore." Patient reports he feels worse because he has had changes in his vision for the past 3 weeks. Patient reports his primary doctor prescribes his psychiatric medication, which he feels are helpful. States he takes Abilify , venlafaxine , gabapentin , does not know doses. Patient unable to engage in safety planning at the time of evaluation.      PAST PSYCHIATRIC HISTORY   Outpatient: Primary care doctor  Inpatient: Once a few years ago  Non-suicidal self injury: Denies  Suicide attempts: 2009 medication overdose "all I did was sleep for 2 days and pee myself" and reports several overdose attempts as a child  Violence: Per chart review, yes Drugs/alcohol: Reports a history of alcohol use disorder, reports he drinks infrequently now Trauma/neglect/abuse: Yes Otherwise as per HPI above.  PAST MEDICAL HISTORY  Past Medical History:  Diagnosis Date   Anxiety    COPD (chronic obstructive pulmonary disease) (HCC)    Depression    Diabetes mellitus without complication (HCC)    GERD  (gastroesophageal reflux disease)    Hypercholesteremia    Hypertension    Pneumonia    PTSD (post-traumatic stress disorder)      HOME MEDICATIONS  Facility Ordered Medications  Medication   gabapentin  (NEURONTIN ) capsule 800 mg   metoprolol  succinate (TOPROL -XL) 24 hr tablet 50 mg   fluticasone  furoate-vilanterol (BREO ELLIPTA ) 100-25 MCG/ACT 1 puff   pantoprazole  (PROTONIX ) EC tablet 40 mg   venlafaxine  XR (EFFEXOR -XR) 24 hr capsule 225 mg   [COMPLETED] aspirin  chewable tablet 324 mg   [START ON 10/08/2023] aspirin  EC tablet 81 mg   [START ON 10/08/2023] insulin  glargine-yfgn (SEMGLEE ) injection 5 Units   acetaminophen  (TYLENOL ) tablet 650 mg   Or   acetaminophen  (TYLENOL ) suppository 650 mg   polyethylene glycol (MIRALAX  / GLYCOLAX ) packet 17 g   bisacodyl  (DULCOLAX) EC tablet 5 mg   ondansetron  (ZOFRAN ) tablet 4 mg   Or   ondansetron  (ZOFRAN ) injection 4 mg   albuterol  (PROVENTIL ) (2.5 MG/3ML) 0.083% nebulizer solution 2.5 mg   nicotine  (NICODERM CQ  - dosed in mg/24 hours) patch 21 mg   insulin  aspart (novoLOG ) injection 0-15 Units   [COMPLETED] clopidogrel  (PLAVIX ) tablet 300 mg   enoxaparin  (LOVENOX ) injection 40 mg   atorvastatin  (LIPITOR) tablet 80 mg   PTA Medications  Medication Sig   acetaminophen  (TYLENOL ) 650 MG CR tablet Take 1,300 mg by mouth every 8 (eight) hours as needed for pain.   pantoprazole  (PROTONIX ) 40 MG tablet TAKE ONE TABLET (40MG  TOTAL) BY MOUTH ONCE DAILY. (Patient taking differently: Take 40 mg by mouth daily.)   metoprolol  succinate (TOPROL -XL) 50 MG 24 hr tablet Take 50 mg by mouth daily.   gabapentin  (NEURONTIN ) 800 MG tablet TAKE 1  TABLET BY MOUTH THREE TIMES A DAY   blood glucose meter kit and supplies KIT Dispense based on patient and insurance preference. Use up to four times daily as directed. (FOR ICD-9 250.00, 250.01).   metFORMIN  (GLUCOPHAGE ) 1000 MG tablet TAKE ONE TABLET (1,000MG  TOTAL) BY MOUTH TWICE DAILY. (Patient not taking:  Reported on 10/07/2023)   cloNIDine  (CATAPRES ) 0.1 MG tablet Take 1 tablet (0.1 mg total) by mouth at bedtime. (Patient not taking: Reported on 11/08/2022)   aspirin  EC 81 MG tablet Take 1 tablet (81 mg total) by mouth daily. Swallow whole. (Patient not taking: Reported on 12/10/2022)   rosuvastatin  (CRESTOR ) 5 MG tablet TAKE ONE TABLET (5MG  TOTAL) BY MOUTH ONCE DAILY. (Patient not taking: Reported on 10/07/2023)   insulin  glargine (LANTUS ) 100 UNIT/ML Solostar Pen Inject 25 Units into the skin at bedtime. (Patient not taking: Reported on 10/07/2023)   clopidogrel  (PLAVIX ) 75 MG tablet Take 1 tablet (75 mg total) by mouth daily. (Patient not taking: Reported on 12/10/2022)   cyanocobalamin  (VITAMIN B12) 1000 MCG tablet Take 1 tablet (1,000 mcg total) by mouth daily. (Patient not taking: Reported on 10/07/2023)   Insulin  Pen Needle (PEN NEEDLES) 32G X 6 MM MISC 1 each by Does not apply route daily.   mometasone -formoterol  (DULERA ) 100-5 MCG/ACT AERO Inhale 2 puffs into the lungs 2 (two) times daily. (Patient not taking: Reported on 10/07/2023)   apixaban  (ELIQUIS ) 5 MG TABS tablet Take 5 mg by mouth 2 (two) times daily. (Patient not taking: Reported on 10/07/2023)   sulfamethoxazole -trimethoprim  (BACTRIM  DS) 800-160 MG tablet Take 1 tablet by mouth 2 (two) times daily. (Patient not taking: Reported on 10/07/2023)   venlafaxine  XR (EFFEXOR  XR) 150 MG 24 hr capsule Take 1 capsule (150 mg total) by mouth daily with breakfast. Patient to take medication with 75 mg tablet for (225 mg total). (Patient not taking: Reported on 10/07/2023)   venlafaxine  XR (EFFEXOR  XR) 75 MG 24 hr capsule Take 1 capsule (75 mg total) by mouth daily with breakfast. Take with one capsule daily with 150 mg capsule for total dose of 225 mg daily (Patient not taking: Reported on 10/07/2023)     ALLERGIES  Allergies  Allergen Reactions   Bee Venom Anaphylaxis   Cheese Anaphylaxis, Swelling and Other (See Comments)    Blue Cheese only    Coffea Arabica Shortness Of Breath   Coffee Flavoring Agent (Non-Screening) Shortness Of Breath    SOCIAL & SUBSTANCE USE HISTORY  Social History   Socioeconomic History   Marital status: Single    Spouse name: Not on file   Number of children: 0   Years of education: Not on file   Highest education level: Not on file  Occupational History   Occupation: Unemployed  Tobacco Use   Smoking status: Every Day    Current packs/day: 1.00    Average packs/day: 1 pack/day for 41.0 years (41.0 ttl pk-yrs)    Types: Cigarettes   Smokeless tobacco: Never   Tobacco comments:    1 pack a day reported but is trying to cut back to stop  Vaping Use   Vaping status: Never Used  Substance and Sexual Activity   Alcohol use: Yes    Alcohol/week: 6.0 standard drinks of alcohol    Types: 6 Cans of beer per week    Comment: 1-2 times weekly; 4 "40s"   Drug use: Not Currently   Sexual activity: Not Currently  Other Topics Concern   Not on file  Social  History Narrative   Lives with 1 roommate   Social Drivers of Health   Financial Resource Strain: Medium Risk (05/28/2022)   Overall Financial Resource Strain (CARDIA)    Difficulty of Paying Living Expenses: Somewhat hard  Food Insecurity: No Food Insecurity (10/07/2023)   Hunger Vital Sign    Worried About Running Out of Food in the Last Year: Never true    Ran Out of Food in the Last Year: Never true  Transportation Needs: No Transportation Needs (10/07/2023)   PRAPARE - Administrator, Civil Service (Medical): No    Lack of Transportation (Non-Medical): No  Physical Activity: Inactive (05/28/2022)   Exercise Vital Sign    Days of Exercise per Week: 0 days    Minutes of Exercise per Session: 0 min  Stress: Stress Concern Present (05/28/2022)   [employer] of Occupational Health - Occupational Stress Questionnaire    Feeling of Stress : Very much  Social Connections: Moderately Isolated (10/07/2023)   Social Connection  and Isolation Panel [NHANES]    Frequency of Communication with Friends and Family: Twice a week    Frequency of Social Gatherings with Friends and Family: Once a week    Attends Religious Services: Never    Database administrator or Organizations: No    Attends Engineer, structural: Never    Marital Status: Living with partner   Social History   Tobacco Use  Smoking Status Every Day   Current packs/day: 1.00   Average packs/day: 1 pack/day for 41.0 years (41.0 ttl pk-yrs)   Types: Cigarettes  Smokeless Tobacco Never  Tobacco Comments   1 pack a day reported but is trying to cut back to stop   Social History   Substance and Sexual Activity  Alcohol Use Yes   Alcohol/week: 6.0 standard drinks of alcohol   Types: 6 Cans of beer per week   Comment: 1-2 times weekly; 4 "40s"   Social History   Substance and Sexual Activity  Drug Use Not Currently      FAMILY HISTORY  Family History  Family history unknown: Yes   Family Psychiatric History (if known):  Denies   MENTAL STATUS EXAM (MSE)  Mental Status Exam: General Appearance: Casual  Orientation:  Full (Time, Place, and Person)  Memory:  Immediate;   Good Recent;   Good Remote;   Good  Concentration:  Concentration: Good  Recall:  Good  Attention  Good  Eye Contact:  Fair  Speech:  Normal Rate  Language:  Good  Volume:  Normal  Mood: Depressed  Affect:  Labile  Thought Process:  Coherent  Thought Content:  Abstract Reasoning  Suicidal Thoughts:  Yes.  with intent/plan  Homicidal Thoughts:  No  Judgement:  Impaired  Insight:  Shallow  Psychomotor Activity:  Normal  Akathisia:  Negative  Fund of Knowledge:  Good    Assets:  Communication Skills Social Support  Cognition:  WNL  ADL's:  Intact  AIMS (if indicated):       VITALS  Blood pressure (!) 145/83, pulse 87, temperature 98.1 F (36.7 C), temperature source Oral, resp. rate 16, height 5\' 10"  (1.778 m), weight 81.6 kg, SpO2 94%.  LABS   Admission on 10/07/2023  Component Date Value Ref Range Status   Sodium 10/07/2023 136  135 - 145 mmol/L Final   Potassium 10/07/2023 3.9  3.5 - 5.1 mmol/L Final   Chloride 10/07/2023 103  98 - 111 mmol/L Final   CO2  10/07/2023 24  22 - 32 mmol/L Final   Glucose, Bld 10/07/2023 306 (H)  70 - 99 mg/dL Final   Glucose reference range applies only to samples taken after fasting for at least 8 hours.   BUN 10/07/2023 16  8 - 23 mg/dL Final   Creatinine, Ser 10/07/2023 0.82  0.61 - 1.24 mg/dL Final   Calcium  10/07/2023 8.4 (L)  8.9 - 10.3 mg/dL Final   Total Protein 56/21/3086 6.9  6.5 - 8.1 g/dL Final   Albumin 57/84/6962 3.7  3.5 - 5.0 g/dL Final   AST 95/28/4132 13 (L)  15 - 41 U/L Final   ALT 10/07/2023 10  0 - 44 U/L Final   Alkaline Phosphatase 10/07/2023 57  38 - 126 U/L Final   Total Bilirubin 10/07/2023 0.5  0.0 - 1.2 mg/dL Final   GFR, Estimated 10/07/2023 >60  >60 mL/min Final   Comment: (NOTE) Calculated using the CKD-EPI Creatinine Equation (2021)    Anion gap 10/07/2023 9  5 - 15 Final   Performed at [facility], [address] [place], [place] [address]   Alcohol, Ethyl (B) 10/07/2023 182 (H)  <10 mg/dL Final   Comment: (NOTE) For medical purposes only. Performed at [facility], [address] [place], [place] [address]    Salicylate Lvl 10/07/2023 <7.0 (L)  7.0 - 30.0 mg/dL Final   Performed at [facility], [address] [place], [place] [address]   Acetaminophen  (Tylenol ), Serum 10/07/2023 <10 (L)  10 - 30 ug/mL Final   Comment: (NOTE) Therapeutic concentrations vary significantly. A range of 10-30 ug/mL  may be an effective concentration for many patients. However, some  are best treated at concentrations outside of this range. Acetaminophen  concentrations >150 ug/mL at 4 hours after ingestion  and >50 ug/mL at 12 hours after ingestion are often associated with  toxic reactions.  Performed at [facility], [address] [place], [place] [address]    WBC 10/07/2023 8.0  4.0 - 10.5 K/uL Final   RBC 10/07/2023 4.34  4.22 - 5.81 MIL/uL Final   Hemoglobin 10/07/2023 13.9  13.0 - 17.0 g/dL Final   HCT 59/56/3875 40.8  39.0 - 52.0 % Final   MCV 10/07/2023 94.0  80.0 - 100.0 fL Final   MCH 10/07/2023 32.0  26.0 - 34.0 pg Final   MCHC 10/07/2023 34.1  30.0 - 36.0 g/dL Final   RDW 64/33/2951 13.9  11.5 - 15.5 % Final   Platelets 10/07/2023 243  150 - 400 K/uL Final   nRBC 10/07/2023 0.0  0.0 - 0.2 % Final   Performed at [facility], [address] [place], [place] [address]   Tricyclic, Ur Screen 10/07/2023 NONE DETECTED  NONE DETECTED Final   Amphetamines, Ur Screen 10/07/2023 NONE DETECTED  NONE DETECTED Final   MDMA (Ecstasy)Ur Screen 10/07/2023 NONE DETECTED  NONE DETECTED Final   Cocaine Metabolite,Ur [place] 10/07/2023 NONE DETECTED  NONE DETECTED Final   Opiate, Ur Screen 10/07/2023 NONE DETECTED  NONE DETECTED Final   Phencyclidine (PCP) Ur S 10/07/2023 NONE DETECTED  NONE DETECTED Final   Cannabinoid 50 Ng, Ur [place] 10/07/2023 NONE DETECTED  NONE DETECTED Final   Barbiturates, Ur Screen 10/07/2023 NONE DETECTED  NONE DETECTED Final   Benzodiazepine, Ur Scrn 10/07/2023 NONE DETECTED  NONE DETECTED Final   Methadone Scn, Ur 10/07/2023 NONE DETECTED  NONE DETECTED Final   Comment: (NOTE) Tricyclics + metabolites, urine    Cutoff 1000 ng/mL Amphetamines +  metabolites, urine  Cutoff 1000 ng/mL MDMA (Ecstasy), urine              Cutoff 500 ng/mL Cocaine Metabolite, urine          Cutoff 300 ng/mL Opiate + metabolites, urine        Cutoff 300 ng/mL Phencyclidine (PCP), urine         Cutoff 25 ng/mL Cannabinoid, urine                 Cutoff 50 ng/mL Barbiturates + metabolites, urine  Cutoff 200 ng/mL Benzodiazepine, urine              Cutoff 200 ng/mL Methadone, urine                   Cutoff 300 ng/mL  The urine drug screen provides only a preliminary, unconfirmed analytical test result  and should not be used for non-medical purposes. Clinical consideration and professional judgment should be applied to any positive drug screen result due to possible interfering substances. A more specific alternate chemical method must be used in order to obtain a confirmed analytical result. Gas chromatography / mass spectrometry (GC/MS) is the preferred confirm                          atory method. Performed at [facility], [address] [place], [place] [address]    Magnesium  10/07/2023 1.8  1.7 - 2.4 mg/dL Final   Performed at [facility], [address] [place], [place] [address]   Glucose-Capillary 10/07/2023 213 (H)  70 - 99 mg/dL Final   Glucose reference range applies only to samples taken after fasting for at least 8 hours.   Prothrombin Time 10/07/2023 13.4  11.4 - 15.2 seconds Final   INR 10/07/2023 1.0  0.8 - 1.2 Final   Comment: (NOTE) INR goal varies based on device and disease states. Performed at [facility], [address] [place], [place] [address]    aPTT 10/07/2023 28  24 - 36 seconds Final   Performed at [facility], [address] [place], [place] [address]   Color, Urine 10/07/2023 STRAW (A)  YELLOW Final   APPearance 10/07/2023 CLEAR (A)  CLEAR Final   Specific Gravity, Urine 10/07/2023 1.008  1.005 - 1.030 Final   pH 10/07/2023 5.0  5.0 - 8.0 Final   Glucose, UA 10/07/2023 >=500 (A)  NEGATIVE mg/dL Final   Hgb urine dipstick 10/07/2023 NEGATIVE  NEGATIVE Final   Bilirubin Urine 10/07/2023 NEGATIVE  NEGATIVE Final   Ketones, ur 10/07/2023 NEGATIVE  NEGATIVE mg/dL Final   Protein, ur 29/56/2130 NEGATIVE  NEGATIVE mg/dL Final   Nitrite 86/57/8469 NEGATIVE  NEGATIVE Final   Leukocytes,Ua 10/07/2023 NEGATIVE  NEGATIVE Final   RBC / HPF 10/07/2023 0-5  0 - 5 RBC/hpf Final   WBC, UA 10/07/2023 0-5  0 - 5 WBC/hpf Final   Bacteria, UA 10/07/2023 NONE SEEN  NONE SEEN Final   Squamous Epithelial / HPF 10/07/2023  0  0 - 5 /HPF Final   Performed at [facility], [address] [place], [place] [address]   Sodium 10/07/2023 137  135 - 145 mmol/L Final   Potassium 10/07/2023 3.6  3.5 - 5.1 mmol/L Final   Chloride 10/07/2023 105  98 - 111 mmol/L Final   CO2 10/07/2023 22  22 - 32 mmol/L Final   Glucose, Bld 10/07/2023 228 (H)  70 - 99  mg/dL Final   Glucose reference range applies only to samples taken after fasting for at least 8 hours.   BUN 10/07/2023 14  8 - 23 mg/dL Final   Creatinine, Ser 10/07/2023 0.66  0.61 - 1.24 mg/dL Final   Calcium  10/07/2023 8.4 (L)  8.9 - 10.3 mg/dL Final   GFR, Estimated 10/07/2023 >60  >60 mL/min Final   Comment: (NOTE) Calculated using the CKD-EPI Creatinine Equation (2021)    Anion gap 10/07/2023 10  5 - 15 Final   Performed at [facility], [address] [place], [place] [address]   WBC 10/07/2023 6.2  4.0 - 10.5 K/uL Final   RBC 10/07/2023 4.04 (L)  4.22 - 5.81 MIL/uL Final   Hemoglobin 10/07/2023 13.1  13.0 - 17.0 g/dL Final   HCT 60/45/4098 37.7 (L)  39.0 - 52.0 % Final   MCV 10/07/2023 93.3  80.0 - 100.0 fL Final   MCH 10/07/2023 32.4  26.0 - 34.0 pg Final   MCHC 10/07/2023 34.7  30.0 - 36.0 g/dL Final   RDW 11/91/4782 13.9  11.5 - 15.5 % Final   Platelets 10/07/2023 228  150 - 400 K/uL Final   nRBC 10/07/2023 0.0  0.0 - 0.2 % Final   Performed at [facility], [address] [place], [place] [address]   Hgb A1c MFr Bld 10/07/2023 9.0 (H)  4.8 - 5.6 % Final   Comment: (NOTE) Pre diabetes:          5.7%-6.4%  Diabetes:              >6.4%  Glycemic control for   <7.0% adults with diabetes    Mean Plasma Glucose 10/07/2023 211.6  mg/dL Final   Performed at [facility], [address], [place], [place] [address]   TSH 10/07/2023 1.856  0.350 - 4.500 uIU/mL Final   Comment: Performed by a 3rd Generation assay with a functional sensitivity of <=0.01 uIU/mL. Performed at [facility], [address]  [place], [place] [address]    Cholesterol 10/07/2023 245 (H)  0 - 200 mg/dL Final   Triglycerides 62/95/2841 425 (H)  <150 mg/dL Final   HDL 32/44/0102 37 (L)  >40 mg/dL Final   Total CHOL/HDL Ratio 10/07/2023 6.6  RATIO Final   VLDL 10/07/2023 UNABLE TO CALCULATE IF TRIGLYCERIDE OVER 400 mg/dL  0 - 40 mg/dL Final   LDL Cholesterol 10/07/2023 UNABLE TO CALCULATE IF TRIGLYCERIDE OVER 400 mg/dL  0 - 99 mg/dL Final   Comment:        Total Cholesterol/HDL:CHD Risk Coronary Heart Disease Risk Table                     Men   Women  1/2 Average Risk   3.4   3.3  Average Risk       5.0   4.4  2 X Average Risk   9.6   7.1  3 X Average Risk  23.4   11.0        Use the calculated Patient Ratio above and the CHD Risk Table to determine the patient's CHD Risk.        ATP III CLASSIFICATION (LDL):  <100     mg/dL   Optimal  725-366  mg/dL   Near or Above                    Optimal  130-159  mg/dL   Borderline  440-347  mg/dL   High  >161     mg/dL   Very High Performed at [facility], [address] [place], [place] [address]    Direct LDL 10/07/2023 141 (H)  0 - 99 mg/dL Final   Performed at [facility], [address], [place], [place] [address]    PSYCHIATRIC REVIEW OF SYSTEMS (ROS)  ROS: Notable for the following relevant positive findings: ROS  Additional findings:      Musculoskeletal: No abnormal movements observed      Gait & Station: Laying/Sitting      Pain Screening: Denies      Nutrition & Dental Concerns: n/a  RISK FORMULATION/ASSESSMENT  Is the patient experiencing any suicidal or homicidal ideations: Yes       Explain if yes: Endorses suicidal ideation with plan, ambivalent intent  Protective factors considered for safety management: Social support  Risk factors/concerns considered for safety management:  Prior attempt Depression Physical illness/chronic pain Access to lethal means Age over 26 Hopelessness Male gender Unmarried  Is there a Paramedic plan with the patient and treatment team to minimize risk factors and promote protective factors: Yes           Explain: Psychiatric hospitalization  Is crisis care placement or psychiatric hospitalization recommended: Yes     Based on my current evaluation and risk assessment, patient is determined at this time to be at:  High risk  *RISK ASSESSMENT Risk assessment is a dynamic process; it is possible that this patient's condition, and risk level, may change. This should be re-evaluated and managed over time as appropriate. Please re-consult psychiatric consult services if additional assistance is needed in terms of risk assessment and management. If your team decides to discharge this patient, please advise the patient how to best access emergency psychiatric services, or to call 911, if their condition worsens or they feel unsafe in any way.   Atha Lazar, MD [facility]

## 2023-10-08 ENCOUNTER — Inpatient Hospital Stay
Admission: AD | Admit: 2023-10-08 | Discharge: 2023-10-16 | DRG: 885 | Disposition: A | Discharge status: Home / Self Care | Payer: MEDICAID | Source: Intra-hospital | Attending: Psychiatry | Admitting: Psychiatry

## 2023-10-08 ENCOUNTER — Encounter: Payer: Self-pay | Admitting: Student in an Organized Health Care Education/Training Program

## 2023-10-08 DIAGNOSIS — Z89422 Acquired absence of other left toe(s): Secondary | ICD-10-CM | POA: Diagnosis not present

## 2023-10-08 DIAGNOSIS — Z7984 Long term (current) use of oral hypoglycemic drugs: Secondary | ICD-10-CM | POA: Diagnosis not present

## 2023-10-08 DIAGNOSIS — Z8673 Personal history of transient ischemic attack (TIA), and cerebral infarction without residual deficits: Secondary | ICD-10-CM

## 2023-10-08 DIAGNOSIS — F4310 Post-traumatic stress disorder, unspecified: Secondary | ICD-10-CM | POA: Diagnosis present

## 2023-10-08 DIAGNOSIS — Z5986 Financial insecurity: Secondary | ICD-10-CM

## 2023-10-08 DIAGNOSIS — E119 Type 2 diabetes mellitus without complications: Secondary | ICD-10-CM | POA: Diagnosis present

## 2023-10-08 DIAGNOSIS — E785 Hyperlipidemia, unspecified: Secondary | ICD-10-CM | POA: Diagnosis not present

## 2023-10-08 DIAGNOSIS — Z634 Disappearance and death of family member: Secondary | ICD-10-CM

## 2023-10-08 DIAGNOSIS — Z794 Long term (current) use of insulin: Secondary | ICD-10-CM

## 2023-10-08 DIAGNOSIS — I110 Hypertensive heart disease with heart failure: Secondary | ICD-10-CM | POA: Diagnosis present

## 2023-10-08 DIAGNOSIS — F17210 Nicotine dependence, cigarettes, uncomplicated: Secondary | ICD-10-CM | POA: Diagnosis present

## 2023-10-08 DIAGNOSIS — I639 Cerebral infarction, unspecified: Secondary | ICD-10-CM | POA: Diagnosis not present

## 2023-10-08 DIAGNOSIS — Z79899 Other long term (current) drug therapy: Secondary | ICD-10-CM | POA: Diagnosis not present

## 2023-10-08 DIAGNOSIS — Z91018 Allergy to other foods: Secondary | ICD-10-CM

## 2023-10-08 DIAGNOSIS — R45851 Suicidal ideations: Secondary | ICD-10-CM | POA: Diagnosis present

## 2023-10-08 DIAGNOSIS — I10 Essential (primary) hypertension: Secondary | ICD-10-CM | POA: Diagnosis not present

## 2023-10-08 DIAGNOSIS — I509 Heart failure, unspecified: Secondary | ICD-10-CM | POA: Diagnosis present

## 2023-10-08 DIAGNOSIS — Z7901 Long term (current) use of anticoagulants: Secondary | ICD-10-CM

## 2023-10-08 DIAGNOSIS — Z89511 Acquired absence of right leg below knee: Secondary | ICD-10-CM | POA: Diagnosis not present

## 2023-10-08 DIAGNOSIS — Z9152 Personal history of nonsuicidal self-harm: Secondary | ICD-10-CM

## 2023-10-08 DIAGNOSIS — Z91199 Patient's noncompliance with other medical treatment and regimen due to unspecified reason: Secondary | ICD-10-CM

## 2023-10-08 DIAGNOSIS — F419 Anxiety disorder, unspecified: Secondary | ICD-10-CM | POA: Diagnosis present

## 2023-10-08 DIAGNOSIS — J449 Chronic obstructive pulmonary disease, unspecified: Secondary | ICD-10-CM | POA: Diagnosis present

## 2023-10-08 DIAGNOSIS — Z560 Unemployment, unspecified: Secondary | ICD-10-CM | POA: Diagnosis not present

## 2023-10-08 DIAGNOSIS — Z720 Tobacco use: Secondary | ICD-10-CM

## 2023-10-08 DIAGNOSIS — Z5900 Homelessness unspecified: Secondary | ICD-10-CM | POA: Diagnosis not present

## 2023-10-08 DIAGNOSIS — F332 Major depressive disorder, recurrent severe without psychotic features: Secondary | ICD-10-CM | POA: Diagnosis present

## 2023-10-08 DIAGNOSIS — R45850 Homicidal ideations: Secondary | ICD-10-CM | POA: Diagnosis present

## 2023-10-08 DIAGNOSIS — Z8701 Personal history of pneumonia (recurrent): Secondary | ICD-10-CM

## 2023-10-08 DIAGNOSIS — Z23 Encounter for immunization: Secondary | ICD-10-CM

## 2023-10-08 DIAGNOSIS — F32A Depression, unspecified: Secondary | ICD-10-CM

## 2023-10-08 DIAGNOSIS — K219 Gastro-esophageal reflux disease without esophagitis: Secondary | ICD-10-CM | POA: Diagnosis present

## 2023-10-08 DIAGNOSIS — Z7951 Long term (current) use of inhaled steroids: Secondary | ICD-10-CM

## 2023-10-08 DIAGNOSIS — F333 Major depressive disorder, recurrent, severe with psychotic symptoms: Secondary | ICD-10-CM | POA: Diagnosis not present

## 2023-10-08 DIAGNOSIS — E7800 Pure hypercholesterolemia, unspecified: Secondary | ICD-10-CM | POA: Diagnosis present

## 2023-10-08 DIAGNOSIS — Z91030 Bee allergy status: Secondary | ICD-10-CM

## 2023-10-08 LAB — COMPREHENSIVE METABOLIC PANEL WITH GFR
ALT: 10 U/L (ref 0–44)
AST: 14 U/L — ABNORMAL LOW (ref 15–41)
Albumin: 3.5 g/dL (ref 3.5–5.0)
Alkaline Phosphatase: 59 U/L (ref 38–126)
Anion gap: 8 (ref 5–15)
BUN: 12 mg/dL (ref 8–23)
CO2: 27 mmol/L (ref 22–32)
Calcium: 8.8 mg/dL — ABNORMAL LOW (ref 8.9–10.3)
Chloride: 102 mmol/L (ref 98–111)
Creatinine, Ser: 0.8 mg/dL (ref 0.61–1.24)
GFR, Estimated: >60 mL/min (ref >60)
Glucose, Bld: 174 mg/dL — ABNORMAL HIGH (ref 70–99)
Potassium: 3.5 mmol/L (ref 3.5–5.1)
Sodium: 137 mmol/L (ref 135–145)
Total Bilirubin: 0.8 mg/dL (ref 0.0–1.2)
Total Protein: 6.5 g/dL (ref 6.5–8.1)

## 2023-10-08 LAB — ECHOCARDIOGRAM COMPLETE
Area-P 1/2: 3.59 cm2
Height: 70 in
S' Lateral: 2.8 cm
Weight: 2880 [oz_av]

## 2023-10-08 LAB — CBG MONITORING, ED
Glucose-Capillary: 222 mg/dL — ABNORMAL HIGH (ref 70–99)
Glucose-Capillary: 217 mg/dL — ABNORMAL HIGH (ref 70–99)
Glucose-Capillary: 250 mg/dL — ABNORMAL HIGH (ref 70–99)

## 2023-10-08 LAB — CBC
HCT: 40.8 % (ref 39.0–52.0)
Hemoglobin: 14.4 g/dL (ref 13.0–17.0)
MCH: 32.4 pg (ref 26.0–34.0)
MCHC: 35.3 g/dL (ref 30.0–36.0)
MCV: 91.7 fL (ref 80.0–100.0)
Platelets: 233 10*3/uL (ref 150–400)
RBC: 4.45 MIL/uL (ref 4.22–5.81)
RDW: 13.4 % (ref 11.5–15.5)
WBC: 6.1 10*3/uL (ref 4.0–10.5)
nRBC: 0 % (ref 0.0–0.2)

## 2023-10-08 LAB — MAGNESIUM: Magnesium: 1.8 mg/dL (ref 1.7–2.4)

## 2023-10-08 LAB — GLUCOSE, CAPILLARY: Glucose-Capillary: 225 mg/dL — ABNORMAL HIGH (ref 70–99)

## 2023-10-08 LAB — SARS CORONAVIRUS 2 BY RT PCR: SARS Coronavirus 2 by RT PCR: NEGATIVE

## 2023-10-08 MED ORDER — ACETAMINOPHEN 325 MG PO TABS
650.0000 mg | ORAL_TABLET | Freq: Four times a day (QID) | ORAL | Status: DC | PRN
  Administered 2023-10-11 – 2023-10-15 (×5): 650 mg via ORAL
  Filled 2023-10-08 (×6): qty 2

## 2023-10-08 MED ORDER — ALUM & MAG HYDROXIDE-SIMETH 200-200-20 MG/5ML PO SUSP: 30.0000 mL | ORAL | Status: DC | PRN

## 2023-10-08 MED ORDER — MAGNESIUM HYDROXIDE 400 MG/5ML PO SUSP: 30.0000 mL | Freq: Every day | ORAL | Status: DC | PRN

## 2023-10-08 MED ORDER — ONDANSETRON HCL 4 MG/2ML IJ SOLN: 4.0000 mg | Freq: Four times a day (QID) | INTRAMUSCULAR | Status: DC | PRN

## 2023-10-08 MED ORDER — TRAZODONE HCL 50 MG PO TABS
50.0000 mg | ORAL_TABLET | Freq: Every evening | ORAL | Status: DC | PRN
  Administered 2023-10-11: 50 mg via ORAL
  Filled 2023-10-08 (×2): qty 1

## 2023-10-08 MED ORDER — INSULIN GLARGINE-YFGN 100 UNIT/ML ~~LOC~~ SOLN
10.0000 [IU] | Freq: Every day | SUBCUTANEOUS | Status: DC
  Administered 2023-10-09: 10 [IU] via SUBCUTANEOUS
  Filled 2023-10-08: qty 0.1

## 2023-10-08 MED ORDER — INSULIN ASPART 100 UNIT/ML IJ SOLN: 0.0000 [IU] | Freq: Every day | INTRAMUSCULAR | Status: DC

## 2023-10-08 MED ORDER — ALBUTEROL SULFATE (2.5 MG/3ML) 0.083% IN NEBU: 2.5000 mg | INHALATION_SOLUTION | RESPIRATORY_TRACT | Status: DC | PRN

## 2023-10-08 MED ORDER — NICOTINE 21 MG/24HR TD PT24
21.0000 mg | MEDICATED_PATCH | Freq: Every day | TRANSDERMAL | Status: DC
  Administered 2023-10-09 – 2023-10-15 (×7): 21 mg via TRANSDERMAL
  Filled 2023-10-08 (×8): qty 1

## 2023-10-08 MED ORDER — LOSARTAN POTASSIUM 50 MG PO TABS
25.0000 mg | ORAL_TABLET | Freq: Every day | ORAL | Status: DC
  Administered 2023-10-08: 25 mg via ORAL
  Filled 2023-10-08: qty 1

## 2023-10-08 MED ORDER — INSULIN ASPART 100 UNIT/ML IJ SOLN
0.0000 [IU] | Freq: Every day | INTRAMUSCULAR | Status: DC
  Administered 2023-10-08 – 2023-10-10 (×3): 2 [IU] via SUBCUTANEOUS
  Administered 2023-10-11: 3 [IU] via SUBCUTANEOUS
  Administered 2023-10-12: 4 [IU] via SUBCUTANEOUS
  Administered 2023-10-13: 5 [IU] via SUBCUTANEOUS
  Administered 2023-10-15: 2 [IU] via SUBCUTANEOUS
  Filled 2023-10-08 (×7): qty 1

## 2023-10-08 MED ORDER — BISACODYL 5 MG PO TBEC: 5.0000 mg | DELAYED_RELEASE_TABLET | Freq: Every day | ORAL | Status: DC | PRN

## 2023-10-08 MED ORDER — ONDANSETRON HCL 4 MG PO TABS: 4.0000 mg | ORAL_TABLET | Freq: Four times a day (QID) | ORAL | Status: DC | PRN

## 2023-10-08 MED ORDER — INSULIN GLARGINE-YFGN 100 UNIT/ML ~~LOC~~ SOLN
10.0000 [IU] | Freq: Every day | SUBCUTANEOUS | Status: DC
  Administered 2023-10-08: 10 [IU] via SUBCUTANEOUS
  Filled 2023-10-08: qty 0.1

## 2023-10-08 MED ORDER — INSULIN ASPART 100 UNIT/ML IJ SOLN: 0.0000 [IU] | Freq: Three times a day (TID) | INTRAMUSCULAR | Status: DC

## 2023-10-08 MED ORDER — POLYETHYLENE GLYCOL 3350 17 G PO PACK
17.0000 g | PACK | Freq: Every day | ORAL | Status: DC
  Administered 2023-10-09 – 2023-10-11 (×3): 17 g via ORAL
  Filled 2023-10-08 (×4): qty 1

## 2023-10-08 MED ORDER — INSULIN ASPART 100 UNIT/ML IJ SOLN
0.0000 [IU] | Freq: Three times a day (TID) | INTRAMUSCULAR | Status: DC
  Administered 2023-10-09: 2 [IU] via SUBCUTANEOUS
  Administered 2023-10-09: 5 [IU] via SUBCUTANEOUS
  Administered 2023-10-09: 11 [IU] via SUBCUTANEOUS
  Administered 2023-10-10 (×2): 8 [IU] via SUBCUTANEOUS
  Administered 2023-10-10: 3 [IU] via SUBCUTANEOUS
  Administered 2023-10-11: 8 [IU] via SUBCUTANEOUS
  Administered 2023-10-11: 5 [IU] via SUBCUTANEOUS
  Administered 2023-10-11: 11 [IU] via SUBCUTANEOUS
  Administered 2023-10-12: 8 [IU] via SUBCUTANEOUS
  Administered 2023-10-12 (×2): 5 [IU] via SUBCUTANEOUS
  Administered 2023-10-13: 3 [IU] via SUBCUTANEOUS
  Administered 2023-10-13: 8 [IU] via SUBCUTANEOUS
  Administered 2023-10-13: 3 [IU] via SUBCUTANEOUS
  Administered 2023-10-14: 8 [IU] via SUBCUTANEOUS
  Administered 2023-10-14: 5 [IU] via SUBCUTANEOUS
  Administered 2023-10-14: 15 [IU] via SUBCUTANEOUS
  Administered 2023-10-15 – 2023-10-16 (×4): 5 [IU] via SUBCUTANEOUS
  Filled 2023-10-08 (×6): qty 1

## 2023-10-08 NOTE — Hospital Course (Addendum)
 Taken from prior notes.  Timothy [name]. is a 63 y.o. male with a PMH significant for R BKA, tobacco use, type 2 diabetes, HTN, HLD, COPD, PTSD, bipolar disorder, anxiety, at baseline self sufficient for ADL but wheelchair-bound presented to ED with concern of suicidal ideation and visual hallucination since the beginning of month, visual hallucinations for quite intense so he wanted to commit suicide. Patient apparently stopped taking his medications due to financial reasons, recently moved in to a new house with a friend and started receiving disability checks and planning to restart his medications. Current smoker of 1 to 1-1/2 pack/day.  Denies illicit or regular alcohol use  On presentation stable vitals, blood glucose 306, EtOH 182.  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  Patient was admitted to [facility] for further stroke workup, neurology and psychiatry was consulted.  4/22: Vital stable, psych is recommending inpatient behavioral health due to suicidal ideation with a plan after completing CVA workup. Labs with A1c of 9, UDS negative, TSH normal, significantly elevated total cholesterol at 245, triglyceride 425, HDL 37 and LDL of 141-started on high intensity statin.  Neurology is recommending only aspirin  as his symptoms are more than 65 weeks old so no need for DAPT.  PT with no follow-up recommendations.  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  Echocardiogram was normal.  Psych was contacted again for transfer to behavioral health as patient completed stroke workup and stable for transfer.    Patient was transferred to behavioral health for further management.

## 2023-10-08 NOTE — Assessment & Plan Note (Signed)
 Hyperglycemia with elevated A1c at 9. Per medical record patient was supposed to take Lantus  and metformin  but he was not taking any of the medications for a while. -Increasing the dose of Semglee  -Continue with SSI -Patient need to resume Lantus  and metformin  on discharge

## 2023-10-08 NOTE — ED Notes (Signed)
Fsbs 250 

## 2023-10-08 NOTE — ED Notes (Signed)
 Report called to Murvin Arthurs geropsych nurse

## 2023-10-08 NOTE — ED Notes (Signed)
 Snack given at this time by this tech

## 2023-10-08 NOTE — BH Assessment (Addendum)
 Patient is to be admitted to [facility] on today 10/08/23 by Dr.  Jadapalle .  Attending Physician will be Dr.  Jadapalle .   Patient has been assigned to room L32, by [facility] Charge Nurse [place].    ER staff is aware of the admission:   Amy, Patient's Nurse  Satira Curet, Patient Access

## 2023-10-08 NOTE — ED Notes (Signed)
 Dinner tray provided for pt

## 2023-10-08 NOTE — ED Notes (Signed)
 vol/medical admit.

## 2023-10-08 NOTE — ED Notes (Signed)
 Per secure chat, Luna Salinas MD, reports we can discontinue cardiac monitoring

## 2023-10-08 NOTE — ED Notes (Signed)
 Admit to Timothy [name] now

## 2023-10-08 NOTE — Assessment & Plan Note (Signed)
 MRI brain with early subacute left PCA territory infarct with some associated edema, no significant mass effect.  Patient is out of window for permissive hypertension or tPA.  Likely happened couple of weeks ago per neurology.  Completed the stroke workup. Neurology is recommending Physically patient is at baseline with no PT recommendations. -Aspirin  and statin -Better control of diabetes and hypertension -Smoking cessation

## 2023-10-08 NOTE — Progress Notes (Signed)
 Progress Note   Patient: Timothy [name]. ZOX:[id number] DOB: Nov 04, 1960 DOA: 10/07/2023     1 DOS: the patient was seen and examined on 10/08/2023   Brief hospital course: Taken from prior notes.  Timothy [name]. is a 63 y.o. male with a PMH significant for R BKA, tobacco use, type 2 diabetes, HTN, HLD, COPD, PTSD, bipolar disorder, anxiety, at baseline self sufficient for ADL but wheelchair-bound presented to ED with concern of suicidal ideation and visual hallucination since the beginning of month, visual hallucinations for quite intense so he wanted to commit suicide. Patient apparently stopped taking his medications due to financial reasons, recently moved in to a new house with a friend and started receiving disability checks and planning to restart his medications. Current smoker of 1 to 1-1/2 pack/day.  Denies illicit or regular alcohol use  On presentation stable vitals, blood glucose 306, EtOH 182.  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  Patient was admitted to [facility] for further stroke workup, neurology and psychiatry was consulted.  4/22: Vital stable, psych is recommending inpatient behavioral health due to suicidal ideation with a plan after completing CVA workup. Labs with A1c of 9, UDS negative, TSH normal, significantly elevated total cholesterol at 245, triglyceride 425, HDL 37 and LDL of 141-started on high intensity statin.  Neurology is recommending only aspirin  as his symptoms are more than 5 weeks old so no need for DAPT.  PT with no follow-up recommendations.  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  Echocardiogram was normal.  Psych was contacted again for transfer to behavioral health as patient completed stroke workup and stable for transfer.  Currently no bed availability.  Assessment and Plan: * Acute CVA  (cerebrovascular accident) (HCC) MRI brain with early subacute left PCA territory infarct with some associated edema, no significant mass effect.  Patient is out of window for permissive hypertension or tPA.  Likely happened couple of weeks ago per neurology.  Completed the stroke workup. Neurology is recommending Physically patient is at baseline with no PT recommendations. -Aspirin  and statin -Better control of diabetes and hypertension -Smoking cessation  Hypertension Blood pressure elevated.  Patient was not taking his medications for a while. -Continue metoprolol  -Added low-dose losartan   Diabetes mellitus without complication (HCC) Hyperglycemia with elevated A1c at 9. Per medical record patient was supposed to take Lantus  and metformin  but he was not taking any of the medications for a while. -Increasing the dose of Semglee  -Continue with SSI -Patient need to resume Lantus  and metformin  on discharge  Depression History of bipolar disorder Suicide ideation with a plan Patient continued to thought about harming himself. Psychiatry on board and recommending inpatient behavioral health -Patient is medically stable to transfer to behavioral health -Continue current medications  Tobacco use Counseling was provided -Continue with nicotine  patch  Dyslipidemia (high LDL; low HDL) Patient was started on high intensity statin      Subjective: Patient was seen and examined today.  He was not taking his medications stating he just got his disability and previously did not had enough money to buy all the medications.  He is planning to restart taking all of his necessity medications.  Patient is still feeling little hopeless with intermittent ideas to kill himself.  Physical Exam: Vitals:   10/08/23 0800 10/08/23 0830 10/08/23 0900 10/08/23 1015  BP: (!) 151/76 (!) 160/80 Aaron Aas)  160/88 119/61  Pulse:  70 69 71  Resp: 17 14 19 17   Temp:      TempSrc:      SpO2:  95% 97% 97%   Weight:      Height:       General.  Well-developed gentleman, in no acute distress. Pulmonary.  Lungs clear bilaterally, normal respiratory effort. CV.  Regular rate and rhythm, no JVD, rub or murmur. Abdomen.  Soft, nontender, nondistended, BS positive. CNS.  Alert and oriented .  No focal neurologic deficit. Extremities.  No edema, right BKA  Data Reviewed: Prior data reviewed  Family Communication: Discussed with patient  Disposition: Status is: Inpatient Remains inpatient appropriate because: Need behavioral health admission, awaiting bed at [facility]  Planned Discharge Destination:  Behavioral health  DVT prophylaxis.  Lovenox  Time spent: 50 minutes  This record has been created using Conservation officer, historic buildings. Errors have been sought and corrected,but may not always be located. Such creation errors do not reflect on the standard of care.   Author: Luna Salinas, MD 10/08/2023 1:25 PM  For on call review [email or web address].

## 2023-10-08 NOTE — Assessment & Plan Note (Signed)
 Blood pressure elevated.  Patient was not taking his medications for a while. -Continue metoprolol  -Added low-dose losartan 

## 2023-10-08 NOTE — Assessment & Plan Note (Signed)
 History of bipolar disorder Suicide ideation with a plan Patient continued to thought about harming himself. Psychiatry on board and recommending inpatient behavioral health -Patient is medically stable to transfer to behavioral health -Continue current medications

## 2023-10-08 NOTE — ED Notes (Signed)
 Meds given.  Pt alert, speech clear.

## 2023-10-08 NOTE — ED Notes (Signed)
 VOL Admitted to [facility]

## 2023-10-08 NOTE — Progress Notes (Signed)
 CTA without significant stenosis.    If echo was without embolic source, secondary stroke prevention will consist of antiplatelet therapy with aspirin  81 mg daily, as well as high intensity statin.  BP control with goal normotension as he is multiple weeks out from his stroke.  Neurology will be available as needed, please call with further questions or concerns.  Ann Keto, MD [facility]   If 7pm- 7am, please page neurology on call as listed in AMION.

## 2023-10-08 NOTE — TOC Initial Note (Signed)
 Transition of Care (TOC) - Initial/Assessment Note    Patient Details  Name: Timothy [name]. MRN: [id number] Date of Birth: Sep 16, 1960  Transition of Care [facility]) CM/SW Contact:    Elmira Haddock, LCSW Phone Number: 10/08/2023, 5:29 PM  Clinical Narrative:   CSW  met wit patient to complete readmit prevention assessment.  CSW introduced self and reason for visit.  Patient was amenable to completing assessment.  Patient's PCP is Rory Collard, MD.  He reports that [facility].  Patient lives with a friend, Edythe Grand.  He has a wheelchair.  At baseline, patient able to complete ADL's without help.  Patient reports that there are no stairs inside the home, but there is a ramp that he uses to enter into the home.  Per consult, CSW inquired about whether patient would like to receive SUD resources. Patient declined.  No further needs to be addressed by TOC. Should additional needs arise, please re-consult.                 Expected Discharge Plan: Home/Self Care Barriers to Discharge: Continued Medical Work up   Patient Goals and CMS Choice            Expected Discharge Plan and Services                                              Prior Living Arrangements/Services   Lives with:: Friends   Do you feel safe going back to the place where you live?: Yes               Activities of Daily Living   ADL Screening (condition at time of admission) Independently performs ADLs?: Yes (appropriate for developmental age) Is the patient deaf or have difficulty hearing?: No Does the patient have difficulty seeing, even when wearing glasses/contacts?: No Does the patient have difficulty concentrating, remembering, or making decisions?: No  Permission Sought/Granted                  Emotional Assessment Appearance:: Appears stated age Attitude/Demeanor/Rapport: Gracious Affect (typically observed): Stable Orientation: : Oriented to  Self, Oriented to Situation, Oriented to Place, Oriented to  Time      Admission diagnosis:  Acute CVA (cerebrovascular accident) [facility]) [I63.9] Patient Active Problem List   Diagnosis Date Noted   Tobacco use 10/08/2023   Acute CVA (cerebrovascular accident) (HCC) 10/07/2023   Reactive thrombocytosis 11/16/2022   Encephalopathy acute 11/09/2022   Pressure injury of skin 11/03/2022   History of DVT (deep vein thrombosis) 11/03/2022   Diabetic foot infection (HCC) 11/02/2022   Depression 11/02/2022   Dyslipidemia (high LDL; low HDL) 11/02/2022   Sepsis (HCC) 11/02/2022   PAD (peripheral artery disease) (HCC) 11/02/2022   Osteomyelitis of right foot (HCC) 11/02/2022   Hyperlipidemia, mixed 10/02/2022   Fall 06/27/2022   Bipolar disorder with psychotic features (HCC) 05/28/2022   Generalized anxiety disorder 05/08/2022   Facial abscess 08/08/2021   Facial cellulitis 08/08/2021   Uncontrolled type 2 diabetes mellitus with hyperglycemia (HCC) 08/08/2021   COPD (chronic obstructive pulmonary disease) (HCC) 08/08/2021   Atherosclerosis of native arteries of the extremities with ulceration (HCC) 04/18/2021   DJD (degenerative joint disease), lumbosacral 04/18/2021   Leg pain 01/23/2021   Acute deep vein thrombosis (DVT) of left lower extremity (HCC) 01/23/2021   Atherosclerosis  of native arteries of extremity with intermittent claudication (HCC) 01/22/2021   MDD (major depressive disorder), recurrent severe, without psychosis (HCC) 12/19/2020   Severe episode of recurrent major depressive disorder, with psychotic features (HCC)    COPD with acute exacerbation (HCC) 12/14/2020   Hx of sleep apnea 12/14/2020   Empyema lung (HCC) 08/15/2020   Type 2 diabetes mellitus with hyperglycemia (HCC) 08/11/2020   Respiratory failure with hypoxia (HCC) 08/11/2020   Hyponatremia 08/11/2020   Chronic diastolic CHF (congestive heart failure) (HCC) 08/11/2020   Anemia 08/11/2020   Abdominal pain  08/11/2020   Empyema (HCC) 08/10/2020   Osteomyelitis (HCC) 03/15/2020   Open wound of foot 01/20/2020   Elevated lipids 01/20/2020   History of colonic polyps    Shortness of breath 11/04/2019   Encounter to establish care 10/15/2019   Hypertension 10/15/2019   Diabetes mellitus without complication (HCC) 10/15/2019   Current smoker 10/15/2019   Peripheral neuropathy 10/15/2019   Cramp, abdominal 10/15/2019   History of hepatitis 10/15/2019   History of gastroesophageal reflux (GERD) 10/15/2019   Type 2 diabetes mellitus with diabetic neuropathy, unspecified (HCC) 10/15/2019   Post-traumatic stress disorder, unspecified 07/14/2019   Antisocial personality disorder (HCC) 07/14/2019   PCP:  Rory Collard, MD Pharmacy:   [id number] [place], [place] - [address] [place] [address] Phone: [phone] Fax: [phone]     Social Drivers of Health (SDOH) Social History: SDOH Screenings   Food Insecurity: No Food Insecurity (10/07/2023)  Housing: Low Risk  (10/07/2023)  Transportation Needs: No Transportation Needs (10/07/2023)  Utilities: Not At Risk (10/07/2023)  Alcohol Screen: Low Risk  (02/10/2020)  Depression (PHQ2-9): High Risk (10/03/2022)  Financial Resource Strain: Medium Risk (05/28/2022)  Physical Activity: Inactive (05/28/2022)  Social Connections: Moderately Isolated (10/07/2023)  Stress: Stress Concern Present (05/28/2022)  Tobacco Use: High Risk (10/08/2023)   SDOH Interventions:     Readmission Risk Interventions    11/06/2022   12:06 PM  Readmission Risk Prevention Plan  Transportation Screening Complete  PCP or Specialist Appt within 3-5 Days Complete  Social Work Consult for Recovery Care Planning/Counseling Complete  Palliative Care Screening Not Applicable  Medication Review Oceanographer) Referral to Pharmacy

## 2023-10-08 NOTE — Assessment & Plan Note (Signed)
 Counseling was provided -Continue with nicotine  patch

## 2023-10-08 NOTE — Assessment & Plan Note (Signed)
 Patient was started on high intensity statin
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
## 2023-10-09 ENCOUNTER — Encounter: Payer: Self-pay | Admitting: Psychiatry

## 2023-10-09 ENCOUNTER — Other Ambulatory Visit: Payer: Self-pay

## 2023-10-09 DIAGNOSIS — F333 Major depressive disorder, recurrent, severe with psychotic symptoms: Secondary | ICD-10-CM

## 2023-10-09 LAB — GLUCOSE, CAPILLARY
Glucose-Capillary: 333 mg/dL — ABNORMAL HIGH (ref 70–99)
Glucose-Capillary: 243 mg/dL — ABNORMAL HIGH (ref 70–99)
Glucose-Capillary: 205 mg/dL — ABNORMAL HIGH (ref 70–99)
Glucose-Capillary: 224 mg/dL — ABNORMAL HIGH (ref 70–99)

## 2023-10-09 MED ORDER — GABAPENTIN 400 MG PO CAPS
400.0000 mg | ORAL_CAPSULE | Freq: Three times a day (TID) | ORAL | Status: DC
  Administered 2023-10-09 – 2023-10-16 (×21): 400 mg via ORAL
  Filled 2023-10-09 (×21): qty 1

## 2023-10-09 MED ORDER — ARIPIPRAZOLE 5 MG PO TABS
5.0000 mg | ORAL_TABLET | Freq: Every day | ORAL | Status: DC
  Administered 2023-10-09 – 2023-10-13 (×5): 5 mg via ORAL
  Filled 2023-10-09 (×5): qty 1

## 2023-10-09 MED ORDER — VENLAFAXINE HCL ER 75 MG PO CP24
150.0000 mg | ORAL_CAPSULE | Freq: Every day | ORAL | Status: DC
  Administered 2023-10-10 – 2023-10-16 (×7): 150 mg via ORAL
  Filled 2023-10-09 (×7): qty 2

## 2023-10-09 MED ORDER — CLOPIDOGREL BISULFATE 75 MG PO TABS
75.0000 mg | ORAL_TABLET | Freq: Every day | ORAL | Status: DC
  Administered 2023-10-09 – 2023-10-16 (×8): 75 mg via ORAL
  Filled 2023-10-09 (×8): qty 1

## 2023-10-09 MED ORDER — APIXABAN 5 MG PO TABS
5.0000 mg | ORAL_TABLET | Freq: Two times a day (BID) | ORAL | Status: DC
  Administered 2023-10-09 – 2023-10-16 (×15): 5 mg via ORAL
  Filled 2023-10-09 (×15): qty 1

## 2023-10-09 MED ORDER — PNEUMOCOCCAL 20-VAL CONJ VACC 0.5 ML IM SUSY
0.5000 mL | PREFILLED_SYRINGE | INTRAMUSCULAR | Status: AC
  Administered 2023-10-10: 0.5 mL via INTRAMUSCULAR
  Filled 2023-10-09: qty 0.5

## 2023-10-09 MED ORDER — VITAMIN B-12 1000 MCG PO TABS
1000.0000 ug | ORAL_TABLET | Freq: Every day | ORAL | Status: DC
  Administered 2023-10-09 – 2023-10-16 (×8): 1000 ug via ORAL
  Filled 2023-10-09 (×8): qty 1

## 2023-10-09 MED ORDER — INSULIN GLARGINE-YFGN 100 UNIT/ML ~~LOC~~ SOPN
5.0000 [IU] | PEN_INJECTOR | Freq: Once | SUBCUTANEOUS | Status: DC
  Filled 2023-10-09: qty 3

## 2023-10-09 MED ORDER — INSULIN GLARGINE-YFGN 100 UNIT/ML ~~LOC~~ SOLN
5.0000 [IU] | Freq: Once | SUBCUTANEOUS | Status: AC
  Administered 2023-10-09: 5 [IU] via SUBCUTANEOUS
  Filled 2023-10-09: qty 0.05

## 2023-10-09 MED ORDER — INSULIN GLARGINE-YFGN 100 UNIT/ML ~~LOC~~ SOLN
15.0000 [IU] | Freq: Every day | SUBCUTANEOUS | Status: DC
  Administered 2023-10-10 – 2023-10-13 (×4): 15 [IU] via SUBCUTANEOUS
  Filled 2023-10-09 (×4): qty 0.15

## 2023-10-09 MED ORDER — INSULIN ASPART 100 UNIT/ML IJ SOLN
3.0000 [IU] | Freq: Three times a day (TID) | INTRAMUSCULAR | Status: DC
  Administered 2023-10-09 – 2023-10-16 (×21): 3 [IU] via SUBCUTANEOUS
  Filled 2023-10-09 (×12): qty 1

## 2023-10-09 NOTE — Inpatient Diabetes Management (Signed)
 Inpatient Diabetes Program Recommendations  AACE/ADA: New Consensus Statement on Inpatient Glycemic Control  Target Ranges:  Prepandial:   less than 140 mg/dL      Peak postprandial:   less than 180 mg/dL (1-2 hours)      Critically ill patients:  140 - 180 mg/dL    Latest Reference Range & Units 10/08/23 09:29 10/08/23 13:21 10/08/23 16:17 10/08/23 22:23 10/09/23 07:39  Glucose-Capillary 70 - 99 mg/dL 098 (H) 119 (H) 147 (H) 225 (H) 333 (H)   Review of Glycemic Control  Diabetes history: DM2 Outpatient Diabetes medications: Lantus  25 units at bedtime (not taking), Metformin  1000 mg BID (not taking) Current orders for Inpatient glycemic control: Semglee  10 units daily, Novolog  0-15 units TID with meals, Novolog  0-5 units QHS  Inpatient Diabetes Program Recommendations:    Insulin : Please consider increasing Semglee  to 15 units daily to start 4/24 (if agreeable, consider ordering Semglee  5 units x1 now for total of 15 units today) and adding Novolog  3 units TID with meals for meal coverage if patient eats at least 50% of meals.  Thanks, Beacher Limerick, RN, MSN, [facility] Diabetes Coordinator Inpatient Diabetes Program [phone] (Team Pager from 8am to 5pm)

## 2023-10-09 NOTE — Consult Note (Addendum)
 WOC Nurse Consult Note: patient has had L ankle wound for 2 months per podiatry note; last seen by podiatry 4/8 with referral to vascular surgery and return appointment to podiatry in 3 weeks  Reason for Consult: L ankle wound  Wound type: full thickness r/t ?venous insufficiency  Pressure Injury POA: NA, not felt related to pressure  Measurement: see nursing flowsheet  Wound bed: clean pink  Drainage (amount, consistency, odor)  per nursing flowsheet  Periwound: intact Dressing procedure/placement/frequency:  Cleanse L ankle wound with Vashe wound cleanser Timm Foot [phone]), do not rinse and allow to air dry.  Apply a small piece of silver hydrofiber Timm Foot [phone]) cut to fit wound bed daily. Cover with silicone foam.  Perform wound care every other day. SOAK SILVER WITH NORMAL SALINE IF STUCK TO WOUND BED FOR ATRAUMATIC REMOVAL.    Patient should continue to follow-up with vascular surgery and podiatry for this wound post discharge.    POC discussed with primary nurse. WOC team will not follow. Re-consult if further needs arise.   Thank you,    Ronni Colace MSN, RN-BC, [employer] [phone]

## 2023-10-09 NOTE — Plan of Care (Signed)
  Problem: Activity: Goal: Will identify at least one activity in which they can participate Outcome: Progressing   Problem: Coping: Goal: Ability to identify and develop effective coping behavior will improve Outcome: Progressing Goal: Ability to interact with others will improve Outcome: Progressing   

## 2023-10-09 NOTE — Evaluation (Signed)
 Physical Therapy Evaluation Patient Details Name: Timothy [name]. MRN: [id number] DOB: 09-28-1960 Today's Date: 10/09/2023  History of Present Illness  Pt is a 63 y.o. 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 Presented with SI and visual changes/hallucinations since the beginning of the month. PMH significant for R BKA, tobacco use, type 2 diabetes, HTN, HLD, COPD, PTSD, bipolar disorder, anxiety.  Clinical Impression  Pt pleasant and interactive t/o PT session.  Initially reports feeling generally weak but did display generally appropriate strength and mobility t/o session.  He does have subacute L shoulder pain/issues that disallow overhead activity and also displays decreased ROM in L ankle lacking DF (5 degrees from neutral).  Pt overall showed ability to perform bed mobility and w/c transfers safely but was very limited with ambulation tolerance using RW; going ~10 ft with significant fatigue. He did not manage to do >25 ft with crutches/walker after BKA 11 months ago and has been using borrowed w/c in poor/unsafe condition since he went home at the time.  Pt clearly needing appropriate w/c with foot rest, appropriate width and safer/functioning brakes - TOC notified of these needs.  PT educated pt on HEP/need to work with L ankle ROM for both circulatory/wound healing considerations as well as increasing ROM to facilitate safe transfers and possible future use of prosthetic.  Pt continues to have R visual block from CVA ~1 month ago.  Pt will benefit from        If plan is discharge home, recommend the following: Assist for transportation   Can travel by private vehicle        Equipment Recommendations Wheelchair (measurements PT) (w/c recommended after R BKA 11 months ago, never recieved - has been borrowing one in poor condition and improperly sized and outfitted for his  needs)  Recommendations for Other Services       Functional Status Assessment Patient has not had a recent decline in their functional status     Precautions / Restrictions Precautions Precautions: Fall Restrictions Weight Bearing Restrictions Per Provider Order: No      Mobility  Bed Mobility Overal bed mobility: Modified Independent             General bed mobility comments: able to get himself up to sitting from supine w/o assist, minimal use of rails    Transfers Overall transfer level: Modified independent Equipment used: None               General transfer comment: MOD I/SUP for SPT to bed>W/C>bed; cueing to lock W/C brakes prior to transfers to prevent falls    Ambulation/Gait Ambulation/Gait assistance: Supervision Gait Distance (Feet): 10 Feet Assistive device: Rolling walker (2 wheels)         General Gait Details: Pt was able to take a few hopping steps forward, turn 180 and return to recliner.  Quick to fatigue and did not display confident effort; unsteady reliance on UEs, L ankle PF posturing also made for some difficulty  Stairs            Wheelchair Mobility     Tilt Bed    Modified Rankin (Stroke Patients Only)       Balance Overall balance assessment: Needs assistance Sitting-balance support: No upper extremity supported Sitting balance-Leahy Scale: Good     Standing balance support: Bilateral upper extremity supported Standing balance-Leahy Scale: Fair Standing balance comment: highly reliant on  UEs, poor tolerance for any prolonged standing effort                             Pertinent Vitals/Pain Pain Assessment Pain Assessment: Faces Faces Pain Scale: Hurts a little bit Pain Location: R BKA/phantom pains and L lateral ankle wound tenderness    Home Living Family/patient expects to be discharged to:: Private residence Living Arrangements: Non-relatives/Friends Available Help at Discharge:  Friend(s) Type of Home: House Home Access: Ramped entrance       Home Layout: One level Home Equipment: Grab bars - toilet;Grab bars - tub/shower;Wheelchair - manual (borrowed w/c in poor condition and not appropriately sized or outfitted)      Prior Function Prior Level of Function : History of Falls (last six months)             Mobility Comments: 7-8 falls in the last 6 months; mostly W/C bound, can hop 3-4 hops; reports mult W/C in the home where he has to hop through dog/baby gates room to room to get to the next W/C ADLs Comments: IND/MOD I; gets down in the tub for bathing     Extremity/Trunk Assessment   Upper Extremity Assessment Upper Extremity Assessment: Overall WFL for tasks assessed (pt reports generally feeling weaker than his baseline but did have functional strength t/o b/l UEs.  L shoulder elevation limited to ~130 with pain, decreased tolerance with overhead tasks)    Lower Extremity Assessment Lower Extremity Assessment: Overall WFL for tasks assessed (L ankle lacks ~5 degrees from neutral (toward PF)) RLE Deficits / Details: R BKA       Communication   Communication Communication: No apparent difficulties    Cognition Arousal: Alert Behavior During Therapy: WFL for tasks assessed/performed   PT - Cognitive impairments: No apparent impairments                       PT - Cognition Comments: Pt did have multiple moments where he struggled to complete a thought/sentence, reports this has been the case for the last ~month Following commands: Intact       Cueing       General Comments General comments (skin integrity, edema, etc.): Pt with non-healing L ankle ulceration, poor standing tolerance, new onset (1 months) R visual field cut    Exercises     Assessment/Plan    PT Assessment Patient needs continued PT services  PT Problem List Decreased strength;Decreased activity tolerance;Decreased range of motion;Decreased  balance;Decreased mobility;Decreased safety awareness;Decreased knowledge of use of DME;Pain       PT Treatment Interventions DME instruction;Gait training;Functional mobility training;Therapeutic activities;Therapeutic exercise;Balance training;Neuromuscular re-education;Patient/family education    PT Goals (Current goals can be found in the Care Plan section)  Acute Rehab PT Goals Patient Stated Goal: get a w/c that works and possibly interested in trying to get and walk with a prosthetic PT Goal Formulation: With patient Time For Goal Achievement: 10/22/23 Potential to Achieve Goals: Good    Frequency Min 1X/week     Co-evaluation               AM-PAC PT "6 Clicks" Mobility  Outcome Measure Help needed turning from your back to your side while in a flat bed without using bedrails?: None Help needed moving from lying on your back to sitting on the side of a flat bed without using bedrails?: None Help needed moving to and from a bed  to a chair (including a wheelchair)?: A Little Help needed standing up from a chair using your arms (e.g., wheelchair or bedside chair)?: A Little Help needed to walk in hospital room?: A Little Help needed climbing 3-5 steps with a railing? : Total 6 Click Score: 18    End of Session Equipment Utilized During Treatment: Gait belt Activity Tolerance: Patient limited by fatigue Patient left: in chair (in w/c, plan to visit the communal room) Nurse Communication: Mobility status (need for w/c) PT Visit Diagnosis: Other abnormalities of gait and mobility (R26.89)    Time: 1400-1445 PT Time Calculation (min) (ACUTE ONLY): 45 min   Charges:   PT Evaluation $PT Eval Moderate Complexity: 1 Mod PT Treatments $Therapeutic Activity: 8-22 mins PT General Charges $$ ACUTE PT VISIT: 1 Visit         Darice Edelman, DPT 10/09/2023, 4:15 PM

## 2023-10-09 NOTE — Progress Notes (Signed)
 Left ankle wound picture taken in media doc.

## 2023-10-09 NOTE — Tx Team (Signed)
 Initial Treatment Plan 10/09/2023 2:35 AM Timothy [name]. ZOX:[id number]    PATIENT STRESSORS: Medication change or noncompliance   Traumatic event     PATIENT STRENGTHS: Forensic psychologist fund of knowledge  Motivation for treatment/growth    PATIENT IDENTIFIED PROBLEMS: "My Husband of 26 years died 3 years ago"  "I stopped taking medications because I could not afford it"  "I was feeling suicidal wanted to roll my wheelchair on a train track"  "I see Black and White kids holding hands walking together"               DISCHARGE CRITERIA:  Ability to meet basic life and health needs Medical problems require only outpatient monitoring Motivation to continue treatment in a less acute level of care Need for constant or close observation no longer present Reduction of life-threatening or endangering symptoms to within safe limits  PRELIMINARY DISCHARGE PLAN: Outpatient therapy Return to previous living arrangement  PATIENT/FAMILY INVOLVEMENT: This treatment plan has been presented to and reviewed with the patient, Timothy Erekson., and/or family member.  The patient and family have been given the opportunity to ask questions and make suggestions.  Timothy Huge, RN 10/09/2023, 2:35 AM

## 2023-10-09 NOTE — Group Note (Signed)
 Therapy Group Note  Group Topic:Other  Group Date: 10/09/2023 Start Time: 1302 End Time: 1338 Facilitators: Glorimar Stroope, Otelia Blew, PT    Group Description:  Group participated with series of yoga poses, designed to emphasize functional standing balance, core stability, generalized flexibility and overall posture.  Incorporated deep breathing techniques with poses, working to promote relaxation, mindfulness and focus with targeted activities.   Discussed benefits of yoga in improving mood and self-esteem, reducing stress and anxiety, and promoting functional strength and balance for each participant.  Discussed ways to integrate into each participant's daily routine.  Provided handout with written and pictorial descriptions of included yoga movements to be utilized as appropriate outside of group time.  Therapeutic Goal(s): Demonstrate safe ability to participate with yoga poses during group activity. Identify one benefit of participation with yoga poses as part of each participant's exercise/movement routine. Identify 1-2 individual poses that participant feels most beneficial to his/her needs and that he/she can easily replicate outside of group.  Individual Participation: Pt engaged, appropriate, and participated actively throughout the session.       Participation Level: Active and Engaged   Participation Quality: Minimal Cues   Behavior: Alert, Appropriate, and Attentive    Speech/Thought Process: Coherent and Organized   Affect/Mood: Appropriate   Insight: Good   Judgement: Good   Individualization: Per above   Modes of Intervention: Activity, Discussion, and Education  Patient Response to Interventions:  Attentive, Engaged, and Interested    Plan: Continue to engage patient in PT groups 1 - 2x/week.  Lavenia Post PT, DPT 10/09/23, 2:14 PM
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
## 2023-10-09 NOTE — Progress Notes (Signed)
   10/09/23 1100  Psych Admission Type (Psych Patients Only)  Admission Status Voluntary  Psychosocial Assessment  Patient Complaints Anxiety;Worrying;Depression  Eye Contact Brief  Facial Expression Worried  Affect Anxious  Speech Logical/coherent  Interaction Minimal;Isolative  Motor Activity Slow  Appearance/Hygiene In scrubs  Behavior Characteristics Calm;Cooperative  Mood Anxious  Thought Process  Coherency WDL  Content WDL  Delusions None reported or observed  Perception WDL  Hallucination None reported or observed  Judgment WDL  Confusion WDL  Danger to Self  Current suicidal ideation? Denies  Agreement Not to Harm Self Yes  Description of Agreement verbal  Danger to Others  Danger to Others None reported or observed
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
## 2023-10-09 NOTE — BH IP Treatment Plan (Signed)
 Interdisciplinary Treatment and Diagnostic Plan Update  10/09/2023 Time of Session: 10:01 AM  Timothy [name]. MRN: [id number]  Principal Diagnosis: MDD (major depressive disorder), recurrent episode, severe (HCC)  Secondary Diagnoses: Principal Problem:   MDD (major depressive disorder), recurrent episode, severe (HCC)   Current Medications:  Current Facility-Administered Medications  Medication Dose Route Frequency Provider Last Rate Last Admin   acetaminophen  (TYLENOL ) tablet 650 mg  650 mg Oral Q6H PRN Lorrene Rosser, Veronique M, NP       albuterol  (PROVENTIL ) (2.5 MG/3ML) 0.083% nebulizer solution 2.5 mg  2.5 mg Nebulization Q2H PRN Lorrene Rosser, Veronique M, NP       alum & mag hydroxide-simeth (MAALOX/MYLANTA) 200-200-20 MG/5ML suspension 30 mL  30 mL Oral Q4H PRN Lorrene Rosser, Veronique M, NP       bisacodyl  (DULCOLAX) EC tablet 5 mg  5 mg Oral Daily PRN Gilman Lade, NP       insulin  aspart (novoLOG ) injection 0-15 Units  0-15 Units Subcutaneous TID WC Onuoha, Chinwendu V, NP   11 Units at 10/09/23 4098   insulin  aspart (novoLOG ) injection 0-5 Units  0-5 Units Subcutaneous QHS Onuoha, Chinwendu V, NP   2 Units at 10/08/23 2300   insulin  aspart (novoLOG ) injection 3 Units  3 Units Subcutaneous TID WC Jadapalle, Sree, MD       Cecily Cohen ON 10/10/2023] insulin  glargine-yfgn (SEMGLEE ) injection 15 Units  15 Units Subcutaneous Daily Jadapalle, Sree, MD       insulin  glargine-yfgn (SEMGLEE ) injection 5 Units  5 Units Subcutaneous Once Madueme, Elvira C, RPH       magnesium  hydroxide (MILK OF MAGNESIA) suspension 30 mL  30 mL Oral Daily PRN Lorrene Rosser, Veronique M, NP       nicotine  (NICODERM CQ  - dosed in mg/24 hours) patch 21 mg  21 mg Transdermal Daily Byungura, Veronique M, NP   21 mg at 10/09/23 1191   ondansetron  (ZOFRAN ) tablet 4 mg  4 mg Oral Q6H PRN Gilman Lade, NP       Or   ondansetron  (ZOFRAN ) injection 4 mg  4 mg Intravenous Q6H PRN Lorrene Rosser, Veronique M, NP        [START ON 10/10/2023] pneumococcal 20-valent conjugate vaccine (PREVNAR 20 ) injection 0.5 mL  0.5 mL Intramuscular Tomorrow-1000 Onuoha, Chinwendu V, NP       polyethylene glycol (MIRALAX  / GLYCOLAX ) packet 17 g  17 g Oral Daily Lorrene Rosser, Veronique M, NP   17 g at 10/09/23 4782   traZODone  (DESYREL ) tablet 50 mg  50 mg Oral QHS PRN Byungura, Veronique M, NP       PTA Medications: Medications Prior to Admission  Medication Sig Dispense Refill Last Dose/Taking   acetaminophen  (TYLENOL ) 650 MG CR tablet Take 1,300 mg by mouth every 8 (eight) hours as needed for pain.      apixaban  (ELIQUIS ) 5 MG TABS tablet Take 5 mg by mouth 2 (two) times daily. (Patient not taking: Reported on 10/07/2023)      aspirin  EC 81 MG tablet Take 1 tablet (81 mg total) by mouth daily. Swallow whole. (Patient not taking: Reported on 12/10/2022) 30 tablet 0    blood glucose meter kit and supplies KIT Dispense based on patient and insurance preference. Use up to four times daily as directed. (FOR ICD-9 250.00, 250.01). 1 each 0    cloNIDine  (CATAPRES ) 0.1 MG tablet Take 1 tablet (0.1 mg total) by mouth at bedtime. (Patient not taking: Reported on 11/08/2022) 30 tablet 2    clopidogrel  (  PLAVIX ) 75 MG tablet Take 1 tablet (75 mg total) by mouth daily. (Patient not taking: Reported on 12/10/2022) 30 tablet 0    cyanocobalamin  (VITAMIN B12) 1000 MCG tablet Take 1 tablet (1,000 mcg total) by mouth daily. (Patient not taking: Reported on 10/07/2023) 30 tablet 0    gabapentin  (NEURONTIN ) 800 MG tablet TAKE 1 TABLET BY MOUTH THREE TIMES A DAY 90 tablet 2    insulin  glargine (LANTUS ) 100 UNIT/ML Solostar Pen Inject 25 Units into the skin at bedtime. (Patient not taking: Reported on 10/07/2023) 15 mL 0    Insulin  Pen Needle (PEN NEEDLES) 32G X 6 MM MISC 1 each by Does not apply route daily. 30 each 0    metFORMIN  (GLUCOPHAGE ) 1000 MG tablet TAKE ONE TABLET (1,000MG  TOTAL) BY MOUTH TWICE DAILY. (Patient not taking: Reported on 10/07/2023) 60  tablet 1    metoprolol  succinate (TOPROL -XL) 50 MG 24 hr tablet Take 50 mg by mouth daily.      mometasone -formoterol  (DULERA ) 100-5 MCG/ACT AERO Inhale 2 puffs into the lungs 2 (two) times daily. (Patient not taking: Reported on 10/07/2023) 1 each 0    pantoprazole  (PROTONIX ) 40 MG tablet TAKE ONE TABLET (40MG  TOTAL) BY MOUTH ONCE DAILY. (Patient taking differently: Take 40 mg by mouth daily.) 30 tablet 1    rosuvastatin  (CRESTOR ) 5 MG tablet TAKE ONE TABLET (5MG  TOTAL) BY MOUTH ONCE DAILY. (Patient not taking: Reported on 10/07/2023) 30 tablet 0    sulfamethoxazole -trimethoprim  (BACTRIM  DS) 800-160 MG tablet Take 1 tablet by mouth 2 (two) times daily. (Patient not taking: Reported on 10/07/2023) 28 tablet 0    venlafaxine  XR (EFFEXOR  XR) 150 MG 24 hr capsule Take 1 capsule (150 mg total) by mouth daily with breakfast. Patient to take medication with 75 mg tablet for (225 mg total). (Patient not taking: Reported on 10/07/2023) 30 capsule 2    venlafaxine  XR (EFFEXOR  XR) 75 MG 24 hr capsule Take 1 capsule (75 mg total) by mouth daily with breakfast. Take with one capsule daily with 150 mg capsule for total dose of 225 mg daily (Patient not taking: Reported on 10/07/2023) 30 capsule 1     Patient Stressors: Medication change or noncompliance   Traumatic event    Patient Strengths: Forensic psychologist fund of knowledge  Motivation for treatment/growth   Treatment Modalities: Medication Management, Group therapy, Case management,  1 to 1 session with clinician, Psychoeducation, Recreational therapy.   Physician Treatment Plan for Primary Diagnosis: MDD (major depressive disorder), recurrent episode, severe (HCC) Long Term Goal(s):     Short Term Goals:    Medication Management: Evaluate patient's response, side effects, and tolerance of medication regimen.  Therapeutic Interventions: 1 to 1 sessions, Unit Group sessions and Medication administration.  Evaluation of Outcomes: Not  Progressing  Physician Treatment Plan for Secondary Diagnosis: Principal Problem:   MDD (major depressive disorder), recurrent episode, severe (HCC)  Long Term Goal(s):     Short Term Goals:       Medication Management: Evaluate patient's response, side effects, and tolerance of medication regimen.  Therapeutic Interventions: 1 to 1 sessions, Unit Group sessions and Medication administration.  Evaluation of Outcomes: Not Progressing   RN Treatment Plan for Primary Diagnosis: MDD (major depressive disorder), recurrent episode, severe (HCC) Long Term Goal(s): Knowledge of disease and therapeutic regimen to maintain health will improve  Short Term Goals: Ability to remain free from injury will improve, Ability to verbalize frustration and anger appropriately will improve, Ability to demonstrate self-control,  Ability to participate in decision making will improve, Ability to verbalize feelings will improve, Ability to disclose and discuss suicidal ideas, Ability to identify and develop effective coping behaviors will improve, and Compliance with prescribed medications will improve  Medication Management: RN will administer medications as ordered by provider, will assess and evaluate patient's response and provide education to patient for prescribed medication. RN will report any adverse and/or side effects to prescribing provider.  Therapeutic Interventions: 1 on 1 counseling sessions, Psychoeducation, Medication administration, Evaluate responses to treatment, Monitor vital signs and CBGs as ordered, Perform/monitor CIWA, COWS, AIMS and Fall Risk screenings as ordered, Perform wound care treatments as ordered.  Evaluation of Outcomes: Not Progressing   LCSW Treatment Plan for Primary Diagnosis: MDD (major depressive disorder), recurrent episode, severe (HCC) Long Term Goal(s): Safe transition to appropriate next level of care at discharge, Engage patient in therapeutic group addressing  interpersonal concerns.  Short Term Goals: Engage patient in aftercare planning with referrals and resources, Increase social support, Increase ability to appropriately verbalize feelings, Increase emotional regulation, Facilitate acceptance of mental health diagnosis and concerns, Facilitate patient progression through stages of change regarding substance use diagnoses and concerns, Identify triggers associated with mental health/substance abuse issues, and Increase skills for wellness and recovery  Therapeutic Interventions: Assess for all discharge needs, 1 to 1 time with Social worker, Explore available resources and support systems, Assess for adequacy in community support network, Educate family and significant other(s) on suicide prevention, Complete Psychosocial Assessment, Interpersonal group therapy.  Evaluation of Outcomes: Not Progressing   Progress in Treatment: Attending groups: No. Participating in groups: No. Taking medication as prescribed: Yes. Toleration medication: Yes. Family/Significant other contact made: No, will contact:  CSW will contact if given permission  Patient understands diagnosis: Yes. Discussing patient identified problems/goals with staff: Yes. Medical problems stabilized or resolved: Yes. Denies suicidal/homicidal ideation: Yes. Issues/concerns per patient self-inventory: No. Other: None  New problem(s) identified: No, Describe:  None identified   New Short Term/Long Term Goal(s):  elimination of symptoms of psychosis, medication management for mood stabilization; elimination of SI thoughts; development of comprehensive mental wellness plan.   Patient Goals:  "I'm looking to get on [address], getting my own money, getting off the street, I don't want to be homeless"   Discharge Plan or Barriers: CSW will assist with appropriate discharge planning   Reason for Continuation of Hospitalization: Depression Medication stabilization  Estimated Length of  Stay: 1 to 7 days   Last 3 [place] Suicide Severity Risk Score: Flowsheet Row Admission (Current) from 10/08/2023 in [facility] ED to Hosp-Admission (Discharged) from 10/07/2023 in [facility] Department [facility] ED to Hosp-Admission (Discharged) from 11/02/2022 in [facility] (1A)  C-SSRS RISK CATEGORY Moderate Risk Moderate Risk No Risk       Last PHQ 2/9 Scores:    10/03/2022   11:17 AM 09/05/2022   11:45 AM 08/10/2022    2:38 PM  Depression screen PHQ 2/9  Decreased Interest 3 3 3   Down, Depressed, Hopeless 3 2 3   PHQ - 2 Score 6 5 6   Altered sleeping 3 3 3   Tired, decreased energy 3 1 3   Change in appetite 2 2 2   Feeling bad or failure about yourself  3 3 3   Trouble concentrating 2 1 2   Moving slowly or fidgety/restless 1 1 1   Suicidal thoughts 3 2 3   PHQ-9 Score 23 18 23   Difficult doing work/chores Extremely dIfficult Very  difficult Extremely dIfficult    Scribe for Treatment Team: Claudio Culver, Milinda Allen 10/09/2023 11:40 AM
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
## 2023-10-10 DIAGNOSIS — F333 Major depressive disorder, recurrent, severe with psychotic symptoms: Secondary | ICD-10-CM

## 2023-10-10 LAB — GLUCOSE, CAPILLARY
Glucose-Capillary: 256 mg/dL — ABNORMAL HIGH (ref 70–99)
Glucose-Capillary: 261 mg/dL — ABNORMAL HIGH (ref 70–99)
Glucose-Capillary: 178 mg/dL — ABNORMAL HIGH (ref 70–99)
Glucose-Capillary: 218 mg/dL — ABNORMAL HIGH (ref 70–99)

## 2023-10-10 NOTE — Group Note (Signed)
 Date:  10/10/2023 Time:  11:13 AM  Group Topic/Focus:  Overcoming Stress:   The focus of this group is to define stress and help patients assess their triggers.    Participation Level:  Active  Participation Quality:  Appropriate  Affect:  Appropriate  Cognitive:  Appropriate  Insight: Appropriate  Engagement in Group:  Engaged  Modes of Intervention:  Discussion  [name] 10/10/2023, 11:13 AM

## 2023-10-10 NOTE — Plan of Care (Signed)
  Problem: Coping: Goal: Ability to identify and develop effective coping behavior will improve Outcome: Progressing   

## 2023-10-10 NOTE — Progress Notes (Signed)
 Patient was cooperative with treatment on the shift, he remains depressed, sad, denies HI/AVH. He voices passive SI, he was visible in the milieu in the evening. He seemed to rest well through out the night.

## 2023-10-10 NOTE — Group Note (Signed)
 Date:  10/10/2023 Time:  8:38 PM  Group Topic/Focus:  Making Healthy Choices:   The focus of this group is to help patients identify negative/unhealthy choices they were using prior to admission and identify positive/healthier coping strategies to replace them upon discharge.    Participation Level:  Active  Participation Quality:  Appropriate  Affect:  Appropriate  Cognitive:  Appropriate  Insight: Good  Engagement in Group:  Engaged  Modes of Intervention:  Discussion  Additional Comments:    Lynette Saras 10/10/2023, 8:38 PM

## 2023-10-10 NOTE — Progress Notes (Signed)
   10/10/23 2000  Psych Admission Type (Psych Patients Only)  Admission Status Voluntary  Psychosocial Assessment  Patient Complaints Anxiety;Depression  Eye Contact Fair  Facial Expression Sad  Affect Appropriate to circumstance  Speech Logical/coherent  Interaction Assertive  Motor Activity Slow  Appearance/Hygiene Unremarkable  Behavior Characteristics Cooperative;Calm  Mood Depressed  Thought Process  Coherency WDL  Content WDL  Delusions None reported or observed  Perception WDL  Hallucination None reported or observed  Judgment WDL  Confusion None  Danger to Self  Current suicidal ideation? Denies  Agreement Not to Harm Self Yes  Description of Agreement Verbal  Danger to Others  Danger to Others None reported or observed

## 2023-10-10 NOTE — Group Note (Signed)
 Recreation Therapy Group Note   Group Topic:Coping Skills  Group Date: 10/10/2023 Start Time: 1400 End Time: 1500 Facilitators: Deatrice Factor, LRT, CTRS Location: Courtyard  Group Description: Outdoor Recreation. Patients had the option to play corn hole, ring toss, bowling or listening to music while outside in the courtyard getting fresh air and sunlight. LRT and patients discussed things that they enjoy doing in their free time outside of the hospital. LRT encouraged patients to drink water after being active and getting their heart rate up.   Goal Area(s) Addressed: Patient will identify leisure interests.  Patient will practice healthy decision making. Patient will engage in recreation activity.   Affect/Mood: N/A   Participation Level: Did not attend    Clinical Observations/Individualized Feedback: Patient did not attend group.   Plan: Continue to engage patient in RT group sessions 2-3x/week.   Deatrice Factor, LRT, CTRS 10/10/2023 4:54 PM

## 2023-10-10 NOTE — Progress Notes (Signed)
   10/10/23 1100  Psych Admission Type (Psych Patients Only)  Admission Status Voluntary  Psychosocial Assessment  Patient Complaints Anxiety  Eye Contact Brief  Facial Expression Worried  Affect Anxious  Speech Logical/coherent  Interaction Minimal;Isolative  Motor Activity Slow  Appearance/Hygiene In scrubs  Behavior Characteristics Calm  Mood Anxious  Thought Process  Coherency WDL  Content WDL  Delusions None reported or observed  Perception WDL  Hallucination None reported or observed  Judgment WDL  Confusion WDL  Danger to Self  Current suicidal ideation? Denies  Agreement Not to Harm Self Yes  Description of Agreement verbal  Danger to Others  Danger to Others None reported or observed   Received PNA vaccine today in left deltoid and tolerated well.

## 2023-10-10 NOTE — Progress Notes (Signed)
 [facility] MD Progress Note  10/10/2023 11:15 AM Timothy [name].  MRN:  [id number]  Timothy [name], Timothy [name]. Is a 63 year old male with a history of PTSD, major depressive disorder, bipolar disorder, anxiety, diabetes, hypertension, hyperlipidemia, right BKA, COPD who presents to the ED with suicidal ideation. Ethanol 182 at 0041 on 4/21. Patient is admitted to [facility] unit with Q15 min safety monitoring. Multidisciplinary team approach is offered. Medication management; group/milieu therapy is offered.   Subjective:  Chart reviewed, case discussed in multidisciplinary meeting, patient seen during rounds.  Patient is noted to be resting in bed.  He is calmer today and participated in interview.  He continues to report feeling hopeless and depressed with ongoing suicidal ideation.  He reports that when he takes his medication his anger comes under control.  He is taking Abilify  and venlafaxine  and reported no side effects.  He denies auditory hallucinations but reports visual hallucinations but unable to give description of the visual hallucinations.  He has fair appetite and is noted to be visible on the unit, participating in the groups.   Sleep: Fair  Appetite:  Fair  Past Psychiatric History: see h&P Family History:  Family History  Family history unknown: Yes   Social History:  Social History   Substance and Sexual Activity  Alcohol Use Yes   Alcohol/week: 6.0 standard drinks of alcohol   Types: 6 Cans of beer per week   Comment: 1-2 times weekly; 4 "40s"     Social History   Substance and Sexual Activity  Drug Use Not Currently    Social History   Socioeconomic History   Marital status: Single    Spouse name: Not on file   Number of children: 0   Years of education: Not on file   Highest education level: Not on file  Occupational History   Occupation: Unemployed  Tobacco Use   Smoking status: Every Day    Current packs/day: 1.00    Average packs/day: 1 pack/day for 41.0  years (41.0 ttl pk-yrs)    Types: Cigarettes   Smokeless tobacco: Never   Tobacco comments:    1 pack a day reported but is trying to cut back to stop  Vaping Use   Vaping status: Never Used  Substance and Sexual Activity   Alcohol use: Yes    Alcohol/week: 6.0 standard drinks of alcohol    Types: 6 Cans of beer per week    Comment: 1-2 times weekly; 4 "40s"   Drug use: Not Currently   Sexual activity: Not Currently  Other Topics Concern   Not on file  Social History Narrative   Lives with 1 roommate   Social Drivers of Health   Financial Resource Strain: Medium Risk (05/28/2022)   Overall Financial Resource Strain (CARDIA)    Difficulty of Paying Living Expenses: Somewhat hard  Food Insecurity: No Food Insecurity (10/08/2023)   Hunger Vital Sign    Worried About Running Out of Food in the Last Year: Never true    Ran Out of Food in the Last Year: Never true  Transportation Needs: No Transportation Needs (10/08/2023)   PRAPARE - Administrator, Civil Service (Medical): No    Lack of Transportation (Non-Medical): No  Physical Activity: Inactive (05/28/2022)   Exercise Vital Sign    Days of Exercise per Week: 0 days    Minutes of Exercise per Session: 0 min  Stress: Stress Concern Present (05/28/2022)   [employer] of Occupational  Health - Occupational Stress Questionnaire    Feeling of Stress : Very much  Social Connections: Moderately Isolated (10/07/2023)   Social Connection and Isolation Panel [NHANES]    Frequency of Communication with Friends and Family: Twice a week    Frequency of Social Gatherings with Friends and Family: Once a week    Attends Religious Services: Never    Database administrator or Organizations: No    Attends Engineer, structural: Never    Marital Status: Living with partner   Past Medical History:  Past Medical History:  Diagnosis Date   Anxiety    CHF (congestive heart failure) (HCC)    COPD (chronic obstructive  pulmonary disease) (HCC)    Depression    Diabetes mellitus without complication (HCC)    GERD (gastroesophageal reflux disease)    Hypercholesteremia    Hypertension    Pneumonia    PTSD (post-traumatic stress disorder)     Past Surgical History:  Procedure Laterality Date   AMPUTATION Left 03/22/2020   Procedure: AMPUTATION RAY-1st Ray Left Foot;  Surgeon: Angel Barba, [name];  Location: [facility] ORS;  Service: Podiatry;  Laterality: Left;   AMPUTATION Right 11/07/2022   Procedure: AMPUTATION BELOW KNEE;  Surgeon: Celso College, MD;  Location: [facility] OR[facility];  Service: Vascular;  Laterality: Right;   AMPUTATION TOE Left 03/16/2020   Procedure: AMPUTATION GREAT TOE;  Surgeon: Angel Barba, [name];  Location: [facility];  Service: Podiatry;  Laterality: Left;   AMPUTATION TOE Left 04/06/2022   Procedure: AMPUTATION TOE;  Surgeon: Angel Barba, [name];  Location: [facility];  Service: Podiatry;  Laterality: Left;   COLONOSCOPY WITH PROPOFOL  N/A 12/22/2019   Procedure: COLONOSCOPY WITH PROPOFOL ;  Surgeon: Marnee Sink, MD;  Location: [facility];  Service: Endoscopy;  Laterality: N/A;   INCISION AND DRAINAGE ABSCESS Left 08/11/2021   Procedure: INCISION AND DRAINAGE ABSCESS;  Surgeon: Lesly Raspberry, MD;  Location: [facility];  Service: ENT;  Laterality: Left;   LOWER EXTREMITY ANGIOGRAPHY Left 03/18/2020   Procedure: Lower Extremity Angiography;  Surgeon: Jackquelyn Mass, MD;  Location: [facility] IN[facility];  Service: Cardiovascular;  Laterality: Left;   LOWER EXTREMITY ANGIOGRAPHY Right 11/05/2022   Procedure: Lower Extremity Angiography;  Surgeon: Celso College, MD;  Location: [facility];  Service: Cardiovascular;  Laterality: Right;   TONSILLECTOMY     VIDEO ASSISTED THORACOSCOPY (VATS)/DECORTICATION Left 08/15/2020   Procedure: VIDEO ASSISTED THORACOSCOPY (VATS)/DECORTICATION;  Surgeon: Hilarie Lovely, MD;  Location: [facility];  Service: Thoracic;  Laterality: Left;   WISDOM TOOTH EXTRACTION       Current Medications: Current Facility-Administered Medications  Medication Dose Route Frequency Provider Last Rate Last Admin   acetaminophen  (TYLENOL ) tablet 650 mg  650 mg Oral Q6H PRN Lorrene Rosser, Veronique M, NP       albuterol  (PROVENTIL ) (2.5 MG/3ML) 0.083% nebulizer solution 2.5 mg  2.5 mg Nebulization Q2H PRN Lorrene Rosser, Veronique M, NP       alum & mag hydroxide-simeth (MAALOX/MYLANTA) 200-200-20 MG/5ML suspension 30 mL  30 mL Oral Q4H PRN Lorrene Rosser, Veronique M, NP       apixaban  (ELIQUIS ) tablet 5 mg  5 mg Oral BID Jerson Furukawa, MD   5 mg at 10/10/23 0840   ARIPiprazole  (ABILIFY ) tablet 5 mg  5 mg Oral QHS Everette Mall, MD   5 mg at 10/09/23 2150   bisacodyl  (DULCOLAX) EC tablet 5 mg  5 mg Oral Daily PRN Gilman Lade, NP  clopidogrel  (PLAVIX ) tablet 75 mg  75 mg Oral Daily Carmina Walle, MD   75 mg at 10/10/23 0840   cyanocobalamin  (VITAMIN B12) tablet 1,000 mcg  1,000 mcg Oral Daily Karlene Southard, MD   1,000 mcg at 10/10/23 0837   gabapentin  (NEURONTIN ) capsule 400 mg  400 mg Oral TID Zurich Carreno, MD   400 mg at 10/10/23 0840   insulin  aspart (novoLOG ) injection 0-15 Units  0-15 Units Subcutaneous TID WC Onuoha, Chinwendu V, NP   8 Units at 10/10/23 0841   insulin  aspart (novoLOG ) injection 0-5 Units  0-5 Units Subcutaneous QHS Onuoha, Chinwendu V, NP   2 Units at 10/09/23 2152   insulin  aspart (novoLOG ) injection 3 Units  3 Units Subcutaneous TID WC Deshawn Witty, MD   3 Units at 10/10/23 1610   insulin  glargine-yfgn (SEMGLEE ) injection 15 Units  15 Units Subcutaneous Daily Mercadies Co, MD   15 Units at 10/10/23 9604   magnesium  hydroxide (MILK OF MAGNESIA) suspension 30 mL  30 mL Oral Daily PRN Gilman Lade, NP       nicotine  (NICODERM CQ  - dosed in mg/24 hours) patch 21 mg  21 mg Transdermal Daily Byungura, Veronique M, NP   21 mg at 10/10/23 5409   ondansetron  (ZOFRAN ) tablet 4 mg  4 mg Oral Q6H PRN Gilman Lade, NP       Or    ondansetron  (ZOFRAN ) injection 4 mg  4 mg Intravenous Q6H PRN Lorrene Rosser, Veronique M, NP       polyethylene glycol (MIRALAX  / GLYCOLAX ) packet 17 g  17 g Oral Daily Lorrene Rosser, Veronique M, NP   17 g at 10/10/23 8119   traZODone  (DESYREL ) tablet 50 mg  50 mg Oral QHS PRN Gilman Lade, NP       venlafaxine  XR (EFFEXOR -XR) 24 hr capsule 150 mg  150 mg Oral Q breakfast Arieonna Medine, MD   150 mg at 10/10/23 1478    Lab Results:  Results for orders placed or performed during the hospital encounter of 10/08/23 (from the past 48 hours)  Glucose, capillary     Status: Abnormal   Collection Time: 10/08/23 10:23 PM  Result Value Ref Range   Glucose-Capillary 225 (H) 70 - 99 mg/dL    Comment: Glucose reference range applies only to samples taken after fasting for at least 8 hours.   Comment 1 Notify RN   Glucose, capillary     Status: Abnormal   Collection Time: 10/09/23  7:39 AM  Result Value Ref Range   Glucose-Capillary 333 (H) 70 - 99 mg/dL    Comment: Glucose reference range applies only to samples taken after fasting for at least 8 hours.  Glucose, capillary     Status: Abnormal   Collection Time: 10/09/23 11:34 AM  Result Value Ref Range   Glucose-Capillary 243 (H) 70 - 99 mg/dL    Comment: Glucose reference range applies only to samples taken after fasting for at least 8 hours.  Glucose, capillary     Status: Abnormal   Collection Time: 10/09/23  4:06 PM  Result Value Ref Range   Glucose-Capillary 205 (H) 70 - 99 mg/dL    Comment: Glucose reference range applies only to samples taken after fasting for at least 8 hours.  Glucose, capillary     Status: Abnormal   Collection Time: 10/09/23  8:19 PM  Result Value Ref Range   Glucose-Capillary 224 (H) 70 - 99 mg/dL    Comment: Glucose reference range applies  only to samples taken after fasting for at least 8 hours.  Glucose, capillary     Status: Abnormal   Collection Time: 10/10/23  7:34 AM  Result Value Ref Range    Glucose-Capillary 256 (H) 70 - 99 mg/dL    Comment: Glucose reference range applies only to samples taken after fasting for at least 8 hours.    Blood Alcohol level:  Lab Results  Component Value Date   ETH 182 (H) 10/07/2023   ETH <10 12/18/2020    Metabolic Disorder Labs: Lab Results  Component Value Date   HGBA1C 9.0 (H) 10/07/2023   MPG 211.6 10/07/2023   MPG 295 08/09/2021   No results found for: "PROLACTIN" Lab Results  Component Value Date   CHOL 245 (H) 10/07/2023   TRIG 425 (H) 10/07/2023   HDL 37 (L) 10/07/2023   CHOLHDL 6.6 10/07/2023   VLDL UNABLE TO CALCULATE IF TRIGLYCERIDE OVER 400 mg/dL 62/13/0865   LDLCALC UNABLE TO CALCULATE IF TRIGLYCERIDE OVER 400 mg/dL 78/46/9629   LDLCALC 70 06/22/2020    Physical Findings: AIMS:  , ,  ,  ,    CIWA:    COWS:      Psychiatric Specialty Exam:  Presentation  General Appearance:  Appropriate for Environment; Casual  Eye Contact: Minimal  Speech: Clear and Coherent  Speech Volume: Decreased    Mood and Affect  Mood: Depressed; Hopeless  Affect: Depressed; Flat   Thought Process  Thought Processes: Coherent  Descriptions of Associations:Intact  Orientation:Full (Time, Place and Person)  Thought Content:Illogical  Hallucinations:Hallucinations: Visual Description of Visual Hallucinations: unable to describe  Ideas of Reference:None  Suicidal Thoughts:Suicidal Thoughts: Yes, Passive SI Passive Intent and/or Plan: Without Plan; Without Intent  Homicidal Thoughts:Homicidal Thoughts: No   Sensorium  Memory: Immediate Fair; Recent Fair; Remote Fair  Judgment: Impaired  Insight: Shallow   Executive Functions  Concentration: Fair  Attention Span: Fair  Recall: Poor  Fund of Knowledge: Fair  Language: Fair   Psychomotor Activity  Psychomotor Activity: Psychomotor Activity: Normal  Musculoskeletal: Strength & Muscle Tone: within normal limits Gait & Station:  normal Assets  Assets: Manufacturing systems engineer; Desire for Improvement; Resilience    Physical Exam: Physical Exam Vitals and nursing note reviewed.  HENT:     Head: Normocephalic.  Cardiovascular:     Pulses: Normal pulses.  Pulmonary:     Effort: Pulmonary effort is normal.  Skin:    General: Skin is warm.  Neurological:     Mental Status: He is alert.    Review of Systems  Constitutional: Negative.   HENT: Negative.    Eyes: Negative.   Gastrointestinal: Negative.   Skin: Negative.   Neurological: Negative.    Blood pressure 99/61, pulse 78, temperature 97.8 F (36.6 C), resp. rate 16, height 5\' 10"  (1.778 m), weight 81.6 kg, SpO2 100%. Body mass index is 25.83 kg/m.  Diagnosis: Principal Problem:   MDD (major depressive disorder), recurrent episode, severe (HCC)   Clinical Decision Making: Patient with history of depression presented to the emergency room with worsening depression and suicidal ideation in the context of multiple psychosocial stressors including not having access to medical care and housing.  Patient is an amputee, wheelchair based and has not been taking his antidepressant medications as he had no access to care.  Patient is displaying impulsivity and mood lability and minimally engaged in the assessments.  Patient is hospitalization for further stabilization   Treatment Plan Summary:   Safety and Monitoring:             --  Voluntary admission to inpatient psychiatric unit for safety, stabilization and treatment             -- Daily contact with patient to assess and evaluate symptoms and progress in treatment             -- Patient's case to be discussed in multi-disciplinary team meeting             -- Observation Level: q15 minute checks             -- Vital signs:  q12 hours             -- Precautions: suicide, elopement, and assault   2. Psychiatric Diagnoses and Treatment:               Effexor  XR 150 mg daily Abilify  5 mg nightly Patient  reported this to medications working well before and gave consent to the medications.   -- The risks/benefits/side-effects/alternatives to this medication were discussed in detail with the patient and time was given for questions. The patient consents to medication trial.                -- Metabolic profile and EKG monitoring obtained while on an atypical antipsychotic (BMI: Lipid Panel: HbgA1c: QTc:)              -- Encouraged patient to participate in unit milieu and in scheduled group therapies                            3. Medical Issues Being Addressed:  Diabetic educator consult placed and then managing the insulin    4. Discharge Planning:              -- Social work and case management to assist with discharge planning and identification of hospital follow-up needs prior to discharge             -- Estimated LOS: 5-7 days             -- Discharge Concerns: Need to establish a safety plan; Medication compliance and effectiveness             -- Discharge Goals: Return home with outpatient referrals follow ups   Physician Treatment Plan for Primary Diagnosis: MDD (major depressive disorder), recurrent episode, severe (HCC) Long Term Goal(s): Improvement in symptoms so as ready for discharge   Short Term Goals: Ability to identify changes in lifestyle to reduce recurrence of condition will improve, Ability to verbalize feelings will improve, Ability to disclose and discuss suicidal ideas, Ability to demonstrate self-control will improve, and Ability to identify and develop effective coping behaviors will improve   Physician Treatment Plan for Secondary Diagnosis: Principal Problem:   MDD (major depressive disorder), recurrent episode, severe (HCC)   Long Term Goal(s): Improvement in symptoms so as ready for discharge   Short Term Goals: Ability to identify changes in lifestyle to reduce recurrence of condition will improve, Ability to verbalize feelings will improve, Ability to disclose  and discuss suicidal ideas, Ability to demonstrate self-control will improve, Ability to identify and develop effective coping behaviors will improve, Ability to maintain clinical measurements within normal limits will improve, and Compliance with prescribed medications will improve    Aurelia Blotter, MD 10/10/2023, 11:15 AM

## 2023-10-11 LAB — GLUCOSE, CAPILLARY
Glucose-Capillary: 230 mg/dL — ABNORMAL HIGH (ref 70–99)
Glucose-Capillary: 296 mg/dL — ABNORMAL HIGH (ref 70–99)
Glucose-Capillary: 272 mg/dL — ABNORMAL HIGH (ref 70–99)
Glucose-Capillary: 291 mg/dL — ABNORMAL HIGH (ref 70–99)

## 2023-10-11 NOTE — Inpatient Diabetes Management (Signed)
 Inpatient Diabetes Program Recommendations  AACE/ADA: New Consensus Statement on Inpatient Glycemic Control (2015)  Target Ranges:  Prepandial:   less than 140 mg/dL      Peak postprandial:   less than 180 mg/dL (1-2 hours)      Critically ill patients:  140 - 180 mg/dL   Lab Results  Component Value Date   GLUCAP 230 (H) 10/11/2023   HGBA1C 9.0 (H) 10/07/2023    Review of Glycemic Control  Latest Reference Range & Units 10/09/23 11:34 10/09/23 16:06 10/09/23 20:19 10/10/23 07:34 10/10/23 11:34 10/10/23 16:56 10/10/23 19:28 10/11/23 07:33  Glucose-Capillary 70 - 99 mg/dL 161 (H) 096 (H) 045 (H) 256 (H) 261 (H) 178 (H) 218 (H) 230 (H)   Diabetes history: DM 2 Outpatient Diabetes medications:  Lantus  25 units q HS (not taking) Metformin  1000 mg bid (not taking) Current orders for Inpatient glycemic control:  Novolog  0-15 units tid with meals and HS Semglee  15 units daily  Inpatient Diabetes Program Recommendations:    Consider increasing Lantus  to 20 units daily and restart Metformin  500 mg bid.   Thanks,  Josefa Ni, RN, BC-ADM Inpatient Diabetes Coordinator Pager [phone]  (8a-5p)

## 2023-10-11 NOTE — Group Note (Signed)
 Therapy Group Note  Group Topic: Upper Extremity Theraband Exercises Group Date: 10/11/2023 Start Time: 1305 End Time: 1345 Facilitators: Hilma Lucks, OT     Group Description: Group instructed in series of upper extremities exercises, aimed to promote strength, flexibility, range of motion and functional endurance.  Patients provided cuing for proper mechanics and proper pace of exercise; exercises adjusted as necessary for individualized patient needs.  Patient also engaged in cognitive components throughout session, working to integrate attention to task, command following, turn-taking and appropriate social interaction throughout session.  Allowed to ask questions as appropriate, and encouraged to identify specific exercises that they could complete independently outside of group sessions.   Therapeutic Goal(s): Demonstrate appropriate performance of upper extremity exercises to promote strength, flexibility, range of motion and functional endurance Identify 2-3 specific upper extremity exercises to complete as home exercise program outside of group session   Individual Participation: Pt engaged and participatory throughout. Minimal visual/verbal cues to correct technique with good carryover. Would engage when prompted with larger group and facilitator.     Participation Level: Active and Engaged   Participation Quality: Minimal Cues   Behavior: Alert, Appropriate, Attentive , and Cooperative   Speech/Thought Process: Organized   Affect/Mood: Appropriate   Insight: Moderate   Judgement: Moderate   Modes of Intervention: Activity, Clarification, Discussion, Education, Problem-solving, and Socialization  Patient Response to Interventions:  Attentive, Engaged, Interested , and Receptive   Plan: Continue to engage patient in OT groups 1-2x/week.  Lue Dubuque R., MPH, MS, OTR/L ascom [phone] 10/11/23, 2:14 PM

## 2023-10-11 NOTE — Group Note (Signed)
 Date:  10/11/2023 Time:  9:14 PM  Group Topic/Focus:  Wrap-Up Group:   The focus of this group is to help patients review their daily goal of treatment and discuss progress on daily workbooks.    Participation Level:  Active  Participation Quality:  Appropriate  Affect:  Appropriate  Cognitive:  Appropriate  Insight: Appropriate  Engagement in Group:  Engaged  Modes of Intervention:  Discussion  Additional Comments:    Rolland Cline 10/11/2023, 9:14 PM
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
## 2023-10-11 NOTE — Progress Notes (Signed)
   10/11/23 2000  Psych Admission Type (Psych Patients Only)  Admission Status Voluntary  Psychosocial Assessment  Patient Complaints Anxiety  Eye Contact Fair  Facial Expression Animated  Affect Appropriate to circumstance  Speech Logical/coherent  Interaction Assertive  Motor Activity Slow (in wheelchair - R BKA)  Appearance/Hygiene Unremarkable;In scrubs  Behavior Characteristics Cooperative;Anxious  Mood Anxious;Pleasant  Thought Process  Coherency WDL  Content WDL  Delusions None reported or observed  Perception WDL  Hallucination None reported or observed  Judgment WDL  Confusion None  Danger to Self  Current suicidal ideation? Denies  Agreement Not to Harm Self Yes  Description of Agreement verbal  Danger to Others  Danger to Others None reported or observed   Progress note   D: Pt seen in dayroom interacting with peers. Pt denies SI, HI, AH. Pt states he has been having some sort of visual hallucinations since he had a stroke.  Pt rates pain  4.5/10 as pain in his R BKA and his back. Pt rates anxiety  8/10 and depression  0/10. Pt states that he has a good conversation with staff social worker who will help him with his housing and other issues. Discussed pts stroke deficits. States he didn't know he had a stroke but noticed changes in his body about 3 weeks ago. "A doctor told me I had a stroke." Pt has right sided arm weakness. Grips +3. States he has some vision changes in his right eye. No other concerns noted at this time.  A: Pt provided support and encouragement. Pt given scheduled medication as prescribed. PRNs as appropriate. Q15 min checks for safety.   R: Pt safe on the unit. Will continue to monitor.

## 2023-10-11 NOTE — Group Note (Signed)
 Date:  10/11/2023 Time:  1:26 PM  Group Topic/Focus:  Movie Group: The purpose of this group is for patients to pick a relaxing movie of their choice     Participation Level:  Did Not Attend  Participation Quality:    Affect:    Cognitive:    Insight:   Engagement in Group:    Modes of Intervention:    Additional Comments:  Did not attend   [name] T [name] 10/11/2023, 1:26 PM

## 2023-10-11 NOTE — Group Note (Signed)
 Recreation Therapy Group Note   Group Topic:Leisure Education  Group Date: 10/11/2023 Start Time: 1400 End Time: 1500 Facilitators: Deatrice Factor, LRT, CTRS Location:  Dayroom  Group Description: Leisure. Patients were given the option to choose from singing karaoke, coloring mandalas, using oil pastels, journaling, or playing with play-doh. LRT and pts discussed the meaning of leisure, the importance of participating in leisure during their free time/when they're outside of the hospital, as well as how our leisure interests can also serve as coping skills.   Goal Area(s) Addressed:  Patient will identify a current leisure interest.  Patient will learn the definition of "leisure". Patient will practice making a positive decision. Patient will have the opportunity to try a new leisure activity. Patient will communicate with peers and LRT.    Affect/Mood: Appropriate   Participation Level: Minimal    Clinical Observations/Individualized Feedback: Timothy [name] came late to group. Pt was present for 10 minutes.   Plan: Continue to engage patient in RT group sessions 2-3x/week.   Deatrice Factor, LRT, CTRS 10/11/2023 4:45 PM

## 2023-10-11 NOTE — Progress Notes (Signed)
   10/11/23 0743  Psychosocial Assessment  Patient Complaints Anxiety;Depression  Eye Contact Fair  Facial Expression Blank  Affect Appropriate to circumstance  Speech Logical/coherent  Interaction Needy  Motor Activity Slow  Appearance/Hygiene Unremarkable  Behavior Characteristics Cooperative  Mood Depressed  Thought Process  Coherency WDL  Content WDL  Delusions None reported or observed  Perception WDL  Hallucination None reported or observed  Judgment WDL  Confusion None  Danger to Self  Current suicidal ideation? Denies  Danger to Others  Danger to Others None reported or observed

## 2023-10-11 NOTE — Progress Notes (Signed)
 [facility] MD Progress Note  10/11/2023 11:31 AM Timothy [name].  MRN:  [id number]  Timothy [name], Timothy [name]. Is a 63 year old male with a history of PTSD, major depressive disorder, bipolar disorder, anxiety, diabetes, hypertension, hyperlipidemia, right BKA, COPD who presents to the ED with suicidal ideation. Ethanol 182 at 0041 on 4/21. Patient is admitted to [facility] unit with Q15 min safety monitoring. Multidisciplinary team approach is offered. Medication management; group/milieu therapy is offered.   Subjective:  Chart reviewed, case discussed in multidisciplinary meeting, patient seen during rounds.  Patient is noted to be resting in bed.  He reports that he had decent sleep and his morning he feels anxious going into the day area.  He reports that he is unable to tolerate loud sounds and too many people in the day area.  He denies SI/HI/plan.  He continues to endorse chronic visual hallucinations but is unable to give description of those and denies auditory hallucinations.  He reports his anxiety and depression is improving but still reports feeling frustrated about his situation of being disabled and unable to meet his medical needs in the community.  Sleep: Fair  Appetite:  Fair  Past Psychiatric History: see h&P Family History:  Family History  Family history unknown: Yes   Social History:  Social History   Substance and Sexual Activity  Alcohol Use Yes   Alcohol/week: 6.0 standard drinks of alcohol   Types: 6 Cans of beer per week   Comment: 1-2 times weekly; 4 "40s"     Social History   Substance and Sexual Activity  Drug Use Not Currently    Social History   Socioeconomic History   Marital status: Single    Spouse name: Not on file   Number of children: 0   Years of education: Not on file   Highest education level: Not on file  Occupational History   Occupation: Unemployed  Tobacco Use   Smoking status: Every Day    Current packs/day: 1.00    Average packs/day: 1  pack/day for 41.0 years (41.0 ttl pk-yrs)    Types: Cigarettes   Smokeless tobacco: Never   Tobacco comments:    1 pack a day reported but is trying to cut back to stop  Vaping Use   Vaping status: Never Used  Substance and Sexual Activity   Alcohol use: Yes    Alcohol/week: 6.0 standard drinks of alcohol    Types: 6 Cans of beer per week    Comment: 1-2 times weekly; 4 "40s"   Drug use: Not Currently   Sexual activity: Not Currently  Other Topics Concern   Not on file  Social History Narrative   Lives with 1 roommate   Social Drivers of Health   Financial Resource Strain: Medium Risk (05/28/2022)   Overall Financial Resource Strain (CARDIA)    Difficulty of Paying Living Expenses: Somewhat hard  Food Insecurity: No Food Insecurity (10/08/2023)   Hunger Vital Sign    Worried About Running Out of Food in the Last Year: Never true    Ran Out of Food in the Last Year: Never true  Transportation Needs: No Transportation Needs (10/08/2023)   PRAPARE - Administrator, Civil Service (Medical): No    Lack of Transportation (Non-Medical): No  Physical Activity: Inactive (05/28/2022)   Exercise Vital Sign    Days of Exercise per Week: 0 days    Minutes of Exercise per Session: 0 min  Stress: Stress Concern Present (  05/28/2022)   [employer] [employer] - Occupational Stress Questionnaire    Feeling of Stress : Very much  Social Connections: Moderately Isolated (10/07/2023)   Social Connection and Isolation Panel [NHANES]    Frequency of Communication with Friends and Family: Twice a week    Frequency of Social Gatherings with Friends and Family: Once a week    Attends Religious Services: Never    Database administrator or Organizations: No    Attends Engineer, structural: Never    Marital Status: Living with partner   Past Medical History:  Past Medical History:  Diagnosis Date   Anxiety    CHF (congestive heart failure) (HCC)    COPD  (chronic obstructive pulmonary disease) (HCC)    Depression    Diabetes mellitus without complication (HCC)    GERD (gastroesophageal reflux disease)    Hypercholesteremia    Hypertension    Pneumonia    PTSD (post-traumatic stress disorder)     Past Surgical History:  Procedure Laterality Date   AMPUTATION Left 03/22/2020   Procedure: AMPUTATION RAY-1st Ray Left Foot;  Surgeon: Angel Barba, [name];  Location: [facility] OR[facility];  Service: Podiatry;  Laterality: Left;   AMPUTATION Right 11/07/2022   Procedure: AMPUTATION BELOW KNEE;  Surgeon: Celso College, MD;  Location: [facility];  Service: Vascular;  Laterality: Right;   AMPUTATION TOE Left 03/16/2020   Procedure: AMPUTATION GREAT TOE;  Surgeon: Angel Barba, [name];  Location: [facility];  Service: Podiatry;  Laterality: Left;   AMPUTATION TOE Left 04/06/2022   Procedure: AMPUTATION TOE;  Surgeon: Angel Barba, [name];  Location: [facility];  Service: Podiatry;  Laterality: Left;   COLONOSCOPY WITH PROPOFOL  N/A 12/22/2019   Procedure: COLONOSCOPY WITH PROPOFOL ;  Surgeon: Marnee Sink, MD;  Location: [facility];  Service: Endoscopy;  Laterality: N/A;   INCISION AND DRAINAGE ABSCESS Left 08/11/2021   Procedure: INCISION AND DRAINAGE ABSCESS;  Surgeon: Lesly Raspberry, MD;  Location: [facility];  Service: ENT;  Laterality: Left;   LOWER EXTREMITY ANGIOGRAPHY Left 03/18/2020   Procedure: Lower Extremity Angiography;  Surgeon: Jackquelyn Mass, MD;  Location: [facility];  Service: Cardiovascular;  Laterality: Left;   LOWER EXTREMITY ANGIOGRAPHY Right 11/05/2022   Procedure: Lower Extremity Angiography;  Surgeon: Celso College, MD;  Location: [facility];  Service: Cardiovascular;  Laterality: Right;   TONSILLECTOMY     VIDEO ASSISTED THORACOSCOPY (VATS)/DECORTICATION Left 08/15/2020   Procedure: VIDEO ASSISTED THORACOSCOPY (VATS)/DECORTICATION;  Surgeon: Hilarie Lovely, MD;  Location: [facility];  Service: Thoracic;  Laterality: Left;   WISDOM TOOTH  EXTRACTION      Current Medications: Current Facility-Administered Medications  Medication Dose Route Frequency Provider Last Rate Last Admin   acetaminophen  (TYLENOL ) tablet 650 mg  650 mg Oral Q6H PRN Lorrene Rosser, Veronique M, NP       albuterol  (PROVENTIL ) (2.5 MG/3ML) 0.083% nebulizer solution 2.5 mg  2.5 mg Nebulization Q2H PRN Lorrene Rosser, Veronique M, NP       alum & mag hydroxide-simeth (MAALOX/MYLANTA) 200-200-20 MG/5ML suspension 30 mL  30 mL Oral Q4H PRN Lorrene Rosser, Veronique M, NP       apixaban  (ELIQUIS ) tablet 5 mg  5 mg Oral BID Tannie Koskela, MD   5 mg at 10/11/23 5784   ARIPiprazole  (ABILIFY ) tablet 5 mg  5 mg Oral QHS Gissela Bloch, MD   5 mg at 10/10/23 2123   bisacodyl  (DULCOLAX) EC tablet 5 mg  5 mg Oral Daily PRN [name]  M, NP       clopidogrel  (PLAVIX ) tablet 75 mg  75 mg Oral Daily Duffy Dantonio, MD   75 mg at 10/11/23 8119   cyanocobalamin  (VITAMIN B12) tablet 1,000 mcg  1,000 mcg Oral Daily Eura Mccauslin, MD   1,000 mcg at 10/11/23 0909   gabapentin  (NEURONTIN ) capsule 400 mg  400 mg Oral TID Shaliyah Taite, MD   400 mg at 10/11/23 1478   insulin  aspart (novoLOG ) injection 0-15 Units  0-15 Units Subcutaneous TID WC Onuoha, Chinwendu V, NP   5 Units at 10/11/23 0910   insulin  aspart (novoLOG ) injection 0-5 Units  0-5 Units Subcutaneous QHS Onuoha, Chinwendu V, NP   2 Units at 10/10/23 2122   insulin  aspart (novoLOG ) injection 3 Units  3 Units Subcutaneous TID WC Kahdijah Errickson, MD   3 Units at 10/11/23 2956   insulin  glargine-yfgn (SEMGLEE ) injection 15 Units  15 Units Subcutaneous Daily Sivan Quast, MD   15 Units at 10/11/23 2130   magnesium  hydroxide (MILK OF MAGNESIA) suspension 30 mL  30 mL Oral Daily PRN Gilman Lade, NP       nicotine  (NICODERM CQ  - dosed in mg/24 hours) patch 21 mg  21 mg Transdermal Daily Byungura, Veronique M, NP   21 mg at 10/11/23 8657   ondansetron  (ZOFRAN ) tablet 4 mg  4 mg Oral Q6H PRN Gilman Lade, NP        Or   ondansetron  (ZOFRAN ) injection 4 mg  4 mg Intravenous Q6H PRN Lorrene Rosser, Veronique M, NP       polyethylene glycol (MIRALAX  / GLYCOLAX ) packet 17 g  17 g Oral Daily Lorrene Rosser, Veronique M, NP   17 g at 10/11/23 8469   traZODone  (DESYREL ) tablet 50 mg  50 mg Oral QHS PRN Gilman Lade, NP       venlafaxine  XR (EFFEXOR -XR) 24 hr capsule 150 mg  150 mg Oral Q breakfast Giovan Pinsky, MD   150 mg at 10/11/23 6295    Lab Results:  Results for orders placed or performed during the hospital encounter of 10/08/23 (from the past 48 hours)  Glucose, capillary     Status: Abnormal   Collection Time: 10/09/23 11:34 AM  Result Value Ref Range   Glucose-Capillary 243 (H) 70 - 99 mg/dL    Comment: Glucose reference range applies only to samples taken after fasting for at least 8 hours.  Glucose, capillary     Status: Abnormal   Collection Time: 10/09/23  4:06 PM  Result Value Ref Range   Glucose-Capillary 205 (H) 70 - 99 mg/dL    Comment: Glucose reference range applies only to samples taken after fasting for at least 8 hours.  Glucose, capillary     Status: Abnormal   Collection Time: 10/09/23  8:19 PM  Result Value Ref Range   Glucose-Capillary 224 (H) 70 - 99 mg/dL    Comment: Glucose reference range applies only to samples taken after fasting for at least 8 hours.  Glucose, capillary     Status: Abnormal   Collection Time: 10/10/23  7:34 AM  Result Value Ref Range   Glucose-Capillary 256 (H) 70 - 99 mg/dL    Comment: Glucose reference range applies only to samples taken after fasting for at least 8 hours.  Glucose, capillary     Status: Abnormal   Collection Time: 10/10/23 11:34 AM  Result Value Ref Range   Glucose-Capillary 261 (H) 70 - 99 mg/dL    Comment: Glucose reference range  applies only to samples taken after fasting for at least 8 hours.  Glucose, capillary     Status: Abnormal   Collection Time: 10/10/23  4:56 PM  Result Value Ref Range   Glucose-Capillary 178 (H)  70 - 99 mg/dL    Comment: Glucose reference range applies only to samples taken after fasting for at least 8 hours.  Glucose, capillary     Status: Abnormal   Collection Time: 10/10/23  7:28 PM  Result Value Ref Range   Glucose-Capillary 218 (H) 70 - 99 mg/dL    Comment: Glucose reference range applies only to samples taken after fasting for at least 8 hours.  Glucose, capillary     Status: Abnormal   Collection Time: 10/11/23  7:33 AM  Result Value Ref Range   Glucose-Capillary 230 (H) 70 - 99 mg/dL    Comment: Glucose reference range applies only to samples taken after fasting for at least 8 hours.    Blood Alcohol level:  Lab Results  Component Value Date   ETH 182 (H) 10/07/2023   ETH <10 12/18/2020    Metabolic Disorder Labs: Lab Results  Component Value Date   HGBA1C 9.0 (H) 10/07/2023   MPG 211.6 10/07/2023   MPG 295 08/09/2021   No results found for: "PROLACTIN" Lab Results  Component Value Date   CHOL 245 (H) 10/07/2023   TRIG 425 (H) 10/07/2023   HDL 37 (L) 10/07/2023   CHOLHDL 6.6 10/07/2023   VLDL UNABLE TO CALCULATE IF TRIGLYCERIDE OVER 400 mg/dL 16/03/9603   LDLCALC UNABLE TO CALCULATE IF TRIGLYCERIDE OVER 400 mg/dL 54/02/8118   LDLCALC 70 06/22/2020    Physical Findings: AIMS:  , ,  ,  ,    CIWA:    COWS:      Psychiatric Specialty Exam:  Presentation  General Appearance:  Appropriate for Environment; Casual  Eye Contact: Fair  Speech: Clear and Coherent  Speech Volume: Normal    Mood and Affect  Mood: Depressed; Hopeless  Affect: Depressed; Flat   Thought Process  Thought Processes: Coherent  Descriptions of Associations:Intact  Orientation:Full (Time, Place and Person)  Thought Content:Logical  Hallucinations:Hallucinations: Visual Description of Visual Hallucinations: unable to describe  Ideas of Reference:None  Suicidal Thoughts:Suicidal Thoughts: No SI Passive Intent and/or Plan: Without Plan; Without  Intent  Homicidal Thoughts:Homicidal Thoughts: No   Sensorium  Memory: Immediate Fair; Recent Fair; Remote Fair  Judgment: Impaired  Insight: Shallow   Executive Functions  Concentration: Fair  Attention Span: Fair  Recall: Fair  Fund of Knowledge: Fair  Language: Fair   Psychomotor Activity  Psychomotor Activity: Psychomotor Activity: Normal  Musculoskeletal: Strength & Muscle Tone: within normal limits Gait & Station: normal Assets  Assets: Manufacturing systems engineer; Desire for Improvement; Financial Resources/Insurance; Social Support    Physical Exam: Physical Exam Vitals and nursing note reviewed.  HENT:     Head: Normocephalic.  Cardiovascular:     Pulses: Normal pulses.  Pulmonary:     Effort: Pulmonary effort is normal.  Skin:    General: Skin is warm.  Neurological:     Mental Status: He is alert.    Review of Systems  Constitutional: Negative.   HENT: Negative.    Eyes: Negative.   Gastrointestinal: Negative.   Skin: Negative.   Neurological: Negative.    Blood pressure 100/75, pulse 82, temperature (!) 97.2 F (36.2 C), resp. rate 18, height 5\' 10"  (1.778 m), weight 81.6 kg, SpO2 100%. Body mass index is 25.83 kg/m.  Diagnosis:  Principal Problem:   MDD (major depressive disorder), recurrent episode, severe (HCC)   Clinical Decision Making: Patient with history of depression presented to the emergency room with worsening depression and suicidal ideation in the context of multiple psychosocial stressors including not having access to medical care and housing.  Patient is an amputee, wheelchair based and has not been taking his antidepressant medications as he had no access to care.  Patient is displaying impulsivity and mood lability and minimally engaged in the assessments.  Patient is hospitalization for further stabilization   Treatment Plan Summary:   Safety and Monitoring:             -- Voluntary admission to inpatient  psychiatric unit for safety, stabilization and treatment             -- Daily contact with patient to assess and evaluate symptoms and progress in treatment             -- Patient's case to be discussed in multi-disciplinary team meeting             -- Observation Level: q15 minute checks             -- Vital signs:  q12 hours             -- Precautions: suicide, elopement, and assault   2. Psychiatric Diagnoses and Treatment:               Effexor  XR 150 mg daily Abilify  5 mg nightly Patient reported this to medications working well before and gave consent to the medications.   -- The risks/benefits/side-effects/alternatives to this medication were discussed in detail with the patient and time was given for questions. The patient consents to medication trial.                -- Metabolic profile and EKG monitoring obtained while on an atypical antipsychotic (BMI: Lipid Panel: HbgA1c: QTc:)              -- Encouraged patient to participate in unit milieu and in scheduled group therapies                            3. Medical Issues Being Addressed:  Diabetic educator consult placed and then managing the insulin    4. Discharge Planning:              -- Social work and case management to assist with discharge planning and identification of hospital follow-up needs prior to discharge             -- Estimated LOS: 5-7 days             -- Discharge Concerns: Need to establish a safety plan; Medication compliance and effectiveness             -- Discharge Goals: Return home with outpatient referrals follow ups   Physician Treatment Plan for Primary Diagnosis: MDD (major depressive disorder), recurrent episode, severe (HCC) Long Term Goal(s): Improvement in symptoms so as ready for discharge   Short Term Goals: Ability to identify changes in lifestyle to reduce recurrence of condition will improve, Ability to verbalize feelings will improve, Ability to disclose and discuss suicidal ideas,  Ability to demonstrate self-control will improve, and Ability to identify and develop effective coping behaviors will improve   Physician Treatment Plan for Secondary Diagnosis: Principal Problem:   MDD (major depressive disorder), recurrent episode, severe (HCC)  Long Term Goal(s): Improvement in symptoms so as ready for discharge   Short Term Goals: Ability to identify changes in lifestyle to reduce recurrence of condition will improve, Ability to verbalize feelings will improve, Ability to disclose and discuss suicidal ideas, Ability to demonstrate self-control will improve, Ability to identify and develop effective coping behaviors will improve, Ability to maintain clinical measurements within normal limits will improve, and Compliance with prescribed medications will improve    Aurelia Blotter, MD 10/11/2023, 11:31 AM

## 2023-10-12 LAB — GLUCOSE, CAPILLARY
Glucose-Capillary: 215 mg/dL — ABNORMAL HIGH (ref 70–99)
Glucose-Capillary: 279 mg/dL — ABNORMAL HIGH (ref 70–99)
Glucose-Capillary: 238 mg/dL — ABNORMAL HIGH (ref 70–99)
Glucose-Capillary: 309 mg/dL — ABNORMAL HIGH (ref 70–99)

## 2023-10-12 MED ORDER — MELATONIN 5 MG PO TABS
5.0000 mg | ORAL_TABLET | Freq: Every evening | ORAL | Status: DC | PRN
  Administered 2023-10-13 – 2023-10-15 (×2): 5 mg via ORAL
  Filled 2023-10-12 (×2): qty 1

## 2023-10-12 NOTE — Group Note (Signed)
 Date:  10/12/2023 Time:  3:29 PM  Group Topic/Focus:  Orientation:   The focus of this group is to educate the patient on the purpose and policies of crisis stabilization and provide a format to answer questions about their admission.  The group details unit policies and expectations of patients while admitted.    Participation Level:  Active  Participation Quality:  Appropriate and Attentive  Affect:  Appropriate  Cognitive:  Oriented  Insight: Appropriate and Good  Engagement in Group:  Engaged  Modes of Intervention:  Orientation  Additional Comments:     Fleet Huh 10/12/2023, 3:29 PM

## 2023-10-12 NOTE — Plan of Care (Signed)
  Problem: Coping: Goal: Ability to identify and develop effective coping behavior will improve Outcome: Progressing Goal: Ability to interact with others will improve Outcome: Progressing Goal: Demonstration of participation in decision-making regarding own care will improve Outcome: Progressing Goal: Ability to use eye contact when communicating with others will improve Outcome: Progressing   Problem: Health Behavior/Discharge Planning: Goal: Identification of resources available to assist in meeting health care needs will improve Outcome: Progressing   Problem: Medication: Goal: Compliance with prescribed medication regimen will improve Outcome: Progressing   Problem: Self-Concept: Goal: Ability to disclose and discuss suicidal ideas will improve Outcome: Progressing   Problem: Education: Goal: Emotional status will improve Outcome: Progressing Goal: Mental status will improve Outcome: Progressing

## 2023-10-12 NOTE — Group Note (Signed)
 Date:  10/12/2023 Time:  9:08 PM  Group Topic/Focus:  Making Healthy Choices:   The focus of this group is to help patients identify negative/unhealthy choices they were using prior to admission and identify positive/healthier coping strategies to replace them upon discharge.    Participation Level:  Active  Participation Quality:  Appropriate  Affect:  Appropriate  Cognitive:  Appropriate  Insight: Good  Engagement in Group:  Engaged  Modes of Intervention:  Discussion  Additional Comments:    Lynette Saras 10/12/2023, 9:08 PM

## 2023-10-12 NOTE — Plan of Care (Signed)
  Problem: Coping: Goal: Ability to interact with others will improve Outcome: Progressing   Problem: Education: Goal: Ability to make informed decisions regarding treatment will improve Outcome: Progressing   Problem: Coping: Goal: Coping ability will improve Outcome: Progressing

## 2023-10-12 NOTE — Progress Notes (Signed)
 [facility] MD Progress Note  10/12/2023 1:16 PM Timothy [name].  MRN:  [id number]  Timothy [name], Timothy [name]. Is a 63 year old male with a history of PTSD, major depressive disorder, bipolar disorder, anxiety, diabetes, hypertension, hyperlipidemia, right BKA, COPD who presents to the ED with suicidal ideation. Ethanol 182 at 0041 on 4/21. Patient is admitted to [facility] unit with Q15 min safety monitoring. Multidisciplinary team approach is offered. Medication management; group/milieu therapy is offered.   Subjective:  Chart reviewed, case discussed in multidisciplinary meeting, patient seen during rounds.  Patient is noted to be resting in bed.  He is noted to be calm and cooperative.  Patient denies SI/HI but continues to endorse depression and anxiety.  He reported that with trazodone  he felt very loopy and sedated and with MiraLAX  he had significant amount of diarrhea yesterday.  He denies SI/HI/plan but continues to feel hopeless about his situation regarding physical health.  Patient is encouraged to participate in groups.  Sleep: Fair  Appetite:  Fair  Past Psychiatric History: see h&P Family History:  Family History  Family history unknown: Yes   Social History:  Social History   Substance and Sexual Activity  Alcohol Use Yes   Alcohol/week: 6.0 standard drinks of alcohol   Types: 6 Cans of beer per week   Comment: 1-2 times weekly; 4 "40s"     Social History   Substance and Sexual Activity  Drug Use Not Currently    Social History   Socioeconomic History   Marital status: Single    Spouse name: Not on file   Number of children: 0   Years of education: Not on file   Highest education level: Not on file  Occupational History   Occupation: Unemployed  Tobacco Use   Smoking status: Every Day    Current packs/day: 1.00    Average packs/day: 1 pack/day for 41.0 years (41.0 ttl pk-yrs)    Types: Cigarettes   Smokeless tobacco: Never   Tobacco comments:    1 pack a day  reported but is trying to cut back to stop  Vaping Use   Vaping status: Never Used  Substance and Sexual Activity   Alcohol use: Yes    Alcohol/week: 6.0 standard drinks of alcohol    Types: 6 Cans of beer per week    Comment: 1-2 times weekly; 4 "40s"   Drug use: Not Currently   Sexual activity: Not Currently  Other Topics Concern   Not on file  Social History Narrative   Lives with 1 roommate   Social Drivers of Health   Financial Resource Strain: Medium Risk (05/28/2022)   Overall Financial Resource Strain (CARDIA)    Difficulty of Paying Living Expenses: Somewhat hard  Food Insecurity: No Food Insecurity (10/08/2023)   Hunger Vital Sign    Worried About Running Out of Food in the Last Year: Never true    Ran Out of Food in the Last Year: Never true  Transportation Needs: No Transportation Needs (10/08/2023)   PRAPARE - Administrator, Civil Service (Medical): No    Lack of Transportation (Non-Medical): No  Physical Activity: Inactive (05/28/2022)   Exercise Vital Sign    Days of Exercise per Week: 0 days    Minutes of Exercise per Session: 0 min  Stress: Stress Concern Present (05/28/2022)   [employer] of Occupational Health - Occupational Stress Questionnaire    Feeling of Stress : Very much  Social Connections: Moderately Isolated (10/07/2023)  Social Connection and Isolation Panel [NHANES]    Frequency of Communication with Friends and Family: Twice a week    Frequency of Social Gatherings with Friends and Family: Once a week    Attends Religious Services: Never    Database administrator or Organizations: No    Attends Engineer, structural: Never    Marital Status: Living with partner   Past Medical History:  Past Medical History:  Diagnosis Date   Anxiety    CHF (congestive heart failure) (HCC)    COPD (chronic obstructive pulmonary disease) (HCC)    Depression    Diabetes mellitus without complication (HCC)    GERD  (gastroesophageal reflux disease)    Hypercholesteremia    Hypertension    Pneumonia    PTSD (post-traumatic stress disorder)     Past Surgical History:  Procedure Laterality Date   AMPUTATION Left 03/22/2020   Procedure: AMPUTATION RAY-1st Ray Left Foot;  Surgeon: Angel Barba, [name];  Location: [facility] ORS;  Service: Podiatry;  Laterality: Left;   AMPUTATION Right 11/07/2022   Procedure: AMPUTATION BELOW KNEE;  Surgeon: Celso College, MD;  Location: [facility] ORS;  Service: Vascular;  Laterality: Right;   AMPUTATION TOE Left 03/16/2020   Procedure: AMPUTATION GREAT TOE;  Surgeon: Angel Barba, [name];  Location: [facility] OR[facility];  Service: Podiatry;  Laterality: Left;   AMPUTATION TOE Left 04/06/2022   Procedure: AMPUTATION TOE;  Surgeon: Angel Barba, [name];  Location: [facility] OR[facility];  Service: Podiatry;  Laterality: Left;   COLONOSCOPY WITH PROPOFOL  N/A 12/22/2019   Procedure: COLONOSCOPY WITH PROPOFOL ;  Surgeon: Marnee Sink, MD;  Location: [facility] ENDOSCOPY;  Service: Endoscopy;  Laterality: N/A;   INCISION AND DRAINAGE ABSCESS Left 08/11/2021   Procedure: INCISION AND DRAINAGE ABSCESS;  Surgeon: Lesly Raspberry, MD;  Location: [facility] ORS;  Service: ENT;  Laterality: Left;   LOWER EXTREMITY ANGIOGRAPHY Left 03/18/2020   Procedure: Lower Extremity Angiography;  Surgeon: Jackquelyn Mass, MD;  Location: [facility] INVASIVE CV LAB;  Service: Cardiovascular;  Laterality: Left;   LOWER EXTREMITY ANGIOGRAPHY Right 11/05/2022   Procedure: Lower Extremity Angiography;  Surgeon: Celso College, MD;  Location: [facility] INVASIVE CV LAB;  Service: Cardiovascular;  Laterality: Right;   TONSILLECTOMY     VIDEO ASSISTED THORACOSCOPY (VATS)/DECORTICATION Left 08/15/2020   Procedure: VIDEO ASSISTED THORACOSCOPY (VATS)/DECORTICATION;  Surgeon: Hilarie Lovely, MD;  Location: [facility] OR;  Service: Thoracic;  Laterality: Left;   WISDOM TOOTH EXTRACTION      Current Medications: Current Facility-Administered Medications  Medication Dose Route Frequency  Provider Last Rate Last Admin   acetaminophen  (TYLENOL ) tablet 650 mg  650 mg Oral Q6H PRN Gilman Lade, NP   650 mg at 10/11/23 2017   albuterol  (PROVENTIL ) (2.5 MG/3ML) 0.083% nebulizer solution 2.5 mg  2.5 mg Nebulization Q2H PRN Gilman Lade, NP       alum & mag hydroxide-simeth (MAALOX/MYLANTA) 200-200-20 MG/5ML suspension 30 mL  30 mL Oral Q4H PRN Lorrene Rosser, Veronique M, NP       apixaban  (ELIQUIS ) tablet 5 mg  5 mg Oral BID Chevonne Bostrom, MD   5 mg at 10/12/23 0900   ARIPiprazole  (ABILIFY ) tablet 5 mg  5 mg Oral QHS Deyani Hegarty, MD   5 mg at 10/11/23 2111   bisacodyl  (DULCOLAX) EC tablet 5 mg  5 mg Oral Daily PRN Byungura, Veronique M, NP       clopidogrel  (PLAVIX ) tablet 75 mg  75 mg Oral Daily Jasmene Goswami, MD   75 mg at  10/12/23 0900   cyanocobalamin  (VITAMIN B12) tablet 1,000 mcg  1,000 mcg Oral Daily Sebastion Jun, MD   1,000 mcg at 10/12/23 0900   gabapentin  (NEURONTIN ) capsule 400 mg  400 mg Oral TID Saige Canton, MD   400 mg at 10/12/23 0900   insulin  aspart (novoLOG ) injection 0-15 Units  0-15 Units Subcutaneous TID WC Onuoha, Chinwendu V, NP   8 Units at 10/12/23 1241   insulin  aspart (novoLOG ) injection 0-5 Units  0-5 Units Subcutaneous QHS Onuoha, Chinwendu V, NP   3 Units at 10/11/23 2112   insulin  aspart (novoLOG ) injection 3 Units  3 Units Subcutaneous TID WC Aubriauna Riner, MD   3 Units at 10/12/23 1241   insulin  glargine-yfgn (SEMGLEE ) injection 15 Units  15 Units Subcutaneous Daily Saniah Schroeter, MD   15 Units at 10/12/23 1610   magnesium  hydroxide (MILK OF MAGNESIA) suspension 30 mL  30 mL Oral Daily PRN Byungura, Veronique M, NP       melatonin tablet 5 mg  5 mg Oral QHS PRN Arlanda Shiplett, MD       nicotine  (NICODERM CQ  - dosed in mg/24 hours) patch 21 mg  21 mg Transdermal Daily Byungura, Veronique M, NP   21 mg at 10/12/23 9604   ondansetron  (ZOFRAN ) tablet 4 mg  4 mg Oral Q6H PRN Gilman Lade, NP       Or   ondansetron   (ZOFRAN ) injection 4 mg  4 mg Intravenous Q6H PRN Lorrene Rosser, Veronique M, NP       polyethylene glycol (MIRALAX  / GLYCOLAX ) packet 17 g  17 g Oral Daily Byungura, Veronique M, NP   17 g at 10/11/23 5409   venlafaxine  XR (EFFEXOR -XR) 24 hr capsule 150 mg  150 mg Oral Q breakfast Ceferino Lang, MD   150 mg at 10/12/23 0900    Lab Results:  Results for orders placed or performed during the hospital encounter of 10/08/23 (from the past 48 hours)  Glucose, capillary     Status: Abnormal   Collection Time: 10/10/23  4:56 PM  Result Value Ref Range   Glucose-Capillary 178 (H) 70 - 99 mg/dL    Comment: Glucose reference range applies only to samples taken after fasting for at least 8 hours.  Glucose, capillary     Status: Abnormal   Collection Time: 10/10/23  7:28 PM  Result Value Ref Range   Glucose-Capillary 218 (H) 70 - 99 mg/dL    Comment: Glucose reference range applies only to samples taken after fasting for at least 8 hours.  Glucose, capillary     Status: Abnormal   Collection Time: 10/11/23  7:33 AM  Result Value Ref Range   Glucose-Capillary 230 (H) 70 - 99 mg/dL    Comment: Glucose reference range applies only to samples taken after fasting for at least 8 hours.  Glucose, capillary     Status: Abnormal   Collection Time: 10/11/23 11:31 AM  Result Value Ref Range   Glucose-Capillary 296 (H) 70 - 99 mg/dL    Comment: Glucose reference range applies only to samples taken after fasting for at least 8 hours.  Glucose, capillary     Status: Abnormal   Collection Time: 10/11/23  4:18 PM  Result Value Ref Range   Glucose-Capillary 272 (H) 70 - 99 mg/dL    Comment: Glucose reference range applies only to samples taken after fasting for at least 8 hours.  Glucose, capillary     Status: Abnormal   Collection Time: 10/11/23  7:42 PM  Result Value Ref Range   Glucose-Capillary 291 (H) 70 - 99 mg/dL    Comment: Glucose reference range applies only to samples taken after fasting for at least  8 hours.  Glucose, capillary     Status: Abnormal   Collection Time: 10/12/23  7:52 AM  Result Value Ref Range   Glucose-Capillary 215 (H) 70 - 99 mg/dL    Comment: Glucose reference range applies only to samples taken after fasting for at least 8 hours.  Glucose, capillary     Status: Abnormal   Collection Time: 10/12/23 11:57 AM  Result Value Ref Range   Glucose-Capillary 279 (H) 70 - 99 mg/dL    Comment: Glucose reference range applies only to samples taken after fasting for at least 8 hours.    Blood Alcohol level:  Lab Results  Component Value Date   ETH 182 (H) 10/07/2023   ETH <10 12/18/2020    Metabolic Disorder Labs: Lab Results  Component Value Date   HGBA1C 9.0 (H) 10/07/2023   MPG 211.6 10/07/2023   MPG 295 08/09/2021   No results found for: "PROLACTIN" Lab Results  Component Value Date   CHOL 245 (H) 10/07/2023   TRIG 425 (H) 10/07/2023   HDL 37 (L) 10/07/2023   CHOLHDL 6.6 10/07/2023   VLDL UNABLE TO CALCULATE IF TRIGLYCERIDE OVER 400 mg/dL 16/03/9603   LDLCALC UNABLE TO CALCULATE IF TRIGLYCERIDE OVER 400 mg/dL 54/02/8118   LDLCALC 70 06/22/2020    Physical Findings: AIMS:  , ,  ,  ,    CIWA:    COWS:      Psychiatric Specialty Exam:  Presentation  General Appearance:  Appropriate for Environment; Casual  Eye Contact: Fair  Speech: Clear and Coherent  Speech Volume: Normal    Mood and Affect  Mood: Depressed; Hopeless  Affect: Depressed; Flat   Thought Process  Thought Processes: Coherent  Descriptions of Associations:Intact  Orientation:Full (Time, Place and Person)  Thought Content:Logical  Hallucinations:Hallucinations: Visual  Ideas of Reference:None  Suicidal Thoughts:Suicidal Thoughts: No  Homicidal Thoughts:Homicidal Thoughts: No   Sensorium  Memory: Immediate Fair; Recent Fair; Remote Fair  Judgment: Impaired  Insight: Shallow   Executive Functions  Concentration: Fair  Attention  Span: Fair  Recall: Fair  Fund of Knowledge: Fair  Language: Fair   Psychomotor Activity  Psychomotor Activity: Psychomotor Activity: Normal  Musculoskeletal: Strength & Muscle Tone: within normal limits Gait & Station: normal Assets  Assets: Manufacturing systems engineer; Desire for Improvement; Resilience; Social Support    Physical Exam: Physical Exam Vitals and nursing note reviewed.  HENT:     Head: Normocephalic.  Cardiovascular:     Pulses: Normal pulses.  Pulmonary:     Effort: Pulmonary effort is normal.  Skin:    General: Skin is warm.  Neurological:     Mental Status: He is alert.    Review of Systems  Constitutional: Negative.   HENT: Negative.    Eyes: Negative.   Gastrointestinal: Negative.   Skin: Negative.   Neurological: Negative.    Blood pressure 111/68, pulse 71, temperature (!) 97.3 F (36.3 C), resp. rate 18, height 5\' 10"  (1.778 m), weight 81.6 kg, SpO2 98%. Body mass index is 25.83 kg/m.  Diagnosis: Principal Problem:   MDD (major depressive disorder), recurrent episode, severe (HCC)   Clinical Decision Making: Patient with history of depression presented to the emergency room with worsening depression and suicidal ideation in the context of multiple psychosocial stressors including not having access to  medical care and housing.  Patient is an amputee, wheelchair based and has not been taking his antidepressant medications as he had no access to care.  Patient is displaying impulsivity and mood lability and minimally engaged in the assessments.  Patient is hospitalization for further stabilization   Treatment Plan Summary:   Safety and Monitoring:             -- Voluntary admission to inpatient psychiatric unit for safety, stabilization and treatment             -- Daily contact with patient to assess and evaluate symptoms and progress in treatment             -- Patient's case to be discussed in multi-disciplinary team meeting              -- Observation Level: q15 minute checks             -- Vital signs:  q12 hours             -- Precautions: suicide, elopement, and assault   2. Psychiatric Diagnoses and Treatment:               Effexor  XR 150 mg daily Abilify  5 mg nightly Patient reported this to medications working well before and gave consent to the medications.   -- The risks/benefits/side-effects/alternatives to this medication were discussed in detail with the patient and time was given for questions. The patient consents to medication trial.                -- Metabolic profile and EKG monitoring obtained while on an atypical antipsychotic (BMI: Lipid Panel: HbgA1c: QTc:)              -- Encouraged patient to participate in unit milieu and in scheduled group therapies                            3. Medical Issues Being Addressed:  Diabetic educator consult placed and then managing the insulin    4. Discharge Planning:              -- Social work and case management to assist with discharge planning and identification of hospital follow-up needs prior to discharge             -- Estimated LOS: 5-7 days             -- Discharge Concerns: Need to establish a safety plan; Medication compliance and effectiveness             -- Discharge Goals: Return home with outpatient referrals follow ups   Physician Treatment Plan for Primary Diagnosis: MDD (major depressive disorder), recurrent episode, severe (HCC) Long Term Goal(s): Improvement in symptoms so as ready for discharge   Short Term Goals: Ability to identify changes in lifestyle to reduce recurrence of condition will improve, Ability to verbalize feelings will improve, Ability to disclose and discuss suicidal ideas, Ability to demonstrate self-control will improve, and Ability to identify and develop effective coping behaviors will improve   Physician Treatment Plan for Secondary Diagnosis: Principal Problem:   MDD (major depressive disorder), recurrent episode,  severe (HCC)   Long Term Goal(s): Improvement in symptoms so as ready for discharge   Short Term Goals: Ability to identify changes in lifestyle to reduce recurrence of condition will improve, Ability to verbalize feelings will improve, Ability to disclose and discuss suicidal ideas, Ability to demonstrate  self-control will improve, Ability to identify and develop effective coping behaviors will improve, Ability to maintain clinical measurements within normal limits will improve, and Compliance with prescribed medications will improve    Yamir Carignan, MD 10/12/2023, 1:16 PM

## 2023-10-12 NOTE — Progress Notes (Signed)
 Patient is pleasant and cooperative.  Endorses anxiety.  Denies SI/HI and AVH.  Pain rated 5/10 in R BKA.  Reports poor sleep.    Compliant with scheduled medications.  Declined Miralax . 15 min checks in place for safety.  Patient is present in the milieu.  Appropriate interaction with peers.

## 2023-10-13 LAB — GLUCOSE, CAPILLARY
Glucose-Capillary: 258 mg/dL — ABNORMAL HIGH (ref 70–99)
Glucose-Capillary: 176 mg/dL — ABNORMAL HIGH (ref 70–99)
Glucose-Capillary: 196 mg/dL — ABNORMAL HIGH (ref 70–99)
Glucose-Capillary: 247 mg/dL — ABNORMAL HIGH (ref 70–99)

## 2023-10-13 MED ORDER — INSULIN GLARGINE-YFGN 100 UNIT/ML ~~LOC~~ SOLN
20.0000 [IU] | Freq: Every day | SUBCUTANEOUS | Status: DC
  Administered 2023-10-14: 20 [IU] via SUBCUTANEOUS
  Filled 2023-10-13 (×2): qty 0.2

## 2023-10-13 MED ORDER — ARIPIPRAZOLE 5 MG PO TABS
5.0000 mg | ORAL_TABLET | Freq: Two times a day (BID) | ORAL | Status: DC
  Administered 2023-10-14 – 2023-10-16 (×5): 5 mg via ORAL
  Filled 2023-10-13 (×6): qty 1

## 2023-10-13 NOTE — Group Note (Signed)
 Date:  10/13/2023 Time:  10:37 PM  Group Topic/Focus:  Wrap-Up Group:   The focus of this group is to help patients review their daily goal of treatment and discuss progress on daily workbooks.    Participation Level:  Active  Participation Quality:  Appropriate  Affect:  Appropriate  Cognitive:  Appropriate  Insight: Appropriate  Engagement in Group:  Engaged  Modes of Intervention:  Discussion    Bradley Caffey 10/13/2023, 10:37 PM

## 2023-10-13 NOTE — Plan of Care (Signed)
  Problem: Coping: Goal: Ability to interact with others will improve Outcome: Progressing   Problem: Education: Goal: Ability to make informed decisions regarding treatment will improve Outcome: Progressing   Problem: Coping: Goal: Coping ability will improve Outcome: Progressing

## 2023-10-13 NOTE — Progress Notes (Signed)
 [facility] MD Progress Note  10/13/2023 10:20 PM Timothy [name].  MRN:  [id number]  Timothy [name], Timothy [name]. Is a 63 year old male with a history of PTSD, major depressive disorder, bipolar disorder, anxiety, diabetes, hypertension, hyperlipidemia, right BKA, COPD who presents to the ED with suicidal ideation. Ethanol 182 at 0041 on 4/21. Patient is admitted to [facility] unit with Q15 min safety monitoring. Multidisciplinary team approach is offered. Medication management; group/milieu therapy is offered.   Subjective:  Chart reviewed, case discussed in multidisciplinary meeting, patient seen during rounds.  Patient is noted to be sleeping in his room.  She reports that last night she did not because of the diarrhea.  Patient was informed that the MiraLAX  has been discontinued.  Patient's trazodone  will switch her to melatonin.  He reports his depression is improving and has some anxiety.  She denies SI/HI/plan.  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  Provider did discuss optimization of dosages.  He did request to have morning medications patients early Sleep: Fair  Appetite:  Fair  Past Psychiatric History: see h&P Family History:  Family History  Family history unknown: Yes   Social History:  Social History   Substance and Sexual Activity  Alcohol Use Yes   Alcohol/week: 6.0 standard drinks of alcohol   Types: 6 Cans of beer per week   Comment: 1-2 times weekly; 4 "40s"     Social History   Substance and Sexual Activity  Drug Use Not Currently    Social History   Socioeconomic History   Marital status: Single    Spouse name: Not on file   Number of children: 0   Years of education: Not on file   Highest education level: Not on file  Occupational History   Occupation: Unemployed  Tobacco Use   Smoking status: Every Day    Current packs/day: 1.00    Average packs/day: 1 pack/day for 41.0 years (41.0 ttl pk-yrs)     Types: Cigarettes   Smokeless tobacco: Never   Tobacco comments:    1 pack a day reported but is trying to cut back to stop  Vaping Use   Vaping status: Never Used  Substance and Sexual Activity   Alcohol use: Yes    Alcohol/week: 6.0 standard drinks of alcohol    Types: 6 Cans of beer per week    Comment: 1-2 times weekly; 4 "40s"   Drug use: Not Currently   Sexual activity: Not Currently  Other Topics Concern   Not on file  Social History Narrative   Lives with 1 roommate   Social Drivers of Health   Financial Resource Strain: Medium Risk (05/28/2022)   Overall Financial Resource Strain (CARDIA)    Difficulty of Paying Living Expenses: Somewhat hard  Food Insecurity: No Food Insecurity (10/08/2023)   Hunger Vital Sign    Worried About Running Out of Food in the Last Year: Never true    Ran Out of Food in the Last Year: Never true  Transportation Needs: No Transportation Needs (10/08/2023)   PRAPARE - Administrator, Civil Service (Medical): No    Lack of Transportation (Non-Medical): No  Physical Activity: Inactive (05/28/2022)   Exercise Vital Sign    Days of Exercise per Week: 0 days    Minutes of Exercise per Session: 0 min  Stress: Stress Concern Present (05/28/2022)   [employer] of Occupational Health - Occupational Stress Questionnaire  Feeling of Stress : Very much  Social Connections: Moderately Isolated (10/07/2023)   Social Connection and Isolation Panel [NHANES]    Frequency of Communication with Friends and Family: Twice a week    Frequency of Social Gatherings with Friends and Family: Once a week    Attends Religious Services: Never    Database administrator or Organizations: No    Attends Engineer, structural: Never    Marital Status: Living with partner   Past Medical History:  Past Medical History:  Diagnosis Date   Anxiety    CHF (congestive heart failure) (HCC)    COPD (chronic obstructive pulmonary disease) (HCC)     Depression    Diabetes mellitus without complication (HCC)    GERD (gastroesophageal reflux disease)    Hypercholesteremia    Hypertension    Pneumonia    PTSD (post-traumatic stress disorder)     Past Surgical History:  Procedure Laterality Date   AMPUTATION Left 03/22/2020   Procedure: AMPUTATION RAY-1st Ray Left Foot;  Surgeon: Angel Barba, [name];  Location: [facility] ORS;  Service: Podiatry;  Laterality: Left;   AMPUTATION Right 11/07/2022   Procedure: AMPUTATION BELOW KNEE;  Surgeon: Celso College, MD;  Location: [facility] ORS;  Service: Vascular;  Laterality: Right;   AMPUTATION TOE Left 03/16/2020   Procedure: AMPUTATION GREAT TOE;  Surgeon: Angel Barba, [name];  Location: [facility] ORS;  Service: Podiatry;  Laterality: Left;   AMPUTATION TOE Left 04/06/2022   Procedure: AMPUTATION TOE;  Surgeon: Angel Barba, [name];  Location: [facility] ORS;  Service: Podiatry;  Laterality: Left;   COLONOSCOPY WITH PROPOFOL  N/A 12/22/2019   Procedure: COLONOSCOPY WITH PROPOFOL ;  Surgeon: Marnee Sink, MD;  Location: [facility] ENDOSCOPY;  Service: Endoscopy;  Laterality: N/A;   INCISION AND DRAINAGE ABSCESS Left 08/11/2021   Procedure: INCISION AND DRAINAGE ABSCESS;  Surgeon: Lesly Raspberry, MD;  Location: [facility] ORS;  Service: ENT;  Laterality: Left;   LOWER EXTREMITY ANGIOGRAPHY Left 03/18/2020   Procedure: Lower Extremity Angiography;  Surgeon: Jackquelyn Mass, MD;  Location: [facility] INVASIVE CV LAB;  Service: Cardiovascular;  Laterality: Left;   LOWER EXTREMITY ANGIOGRAPHY Right 11/05/2022   Procedure: Lower Extremity Angiography;  Surgeon: Celso College, MD;  Location: [facility] INVASIVE CV LAB;  Service: Cardiovascular;  Laterality: Right;   TONSILLECTOMY     VIDEO ASSISTED THORACOSCOPY (VATS)/DECORTICATION Left 08/15/2020   Procedure: VIDEO ASSISTED THORACOSCOPY (VATS)/DECORTICATION;  Surgeon: Hilarie Lovely, MD;  Location: [facility] OR;  Service: Thoracic;  Laterality: Left;   WISDOM TOOTH EXTRACTION      Current Medications: Current  Facility-Administered Medications  Medication Dose Route Frequency Provider Last Rate Last Admin   acetaminophen  (TYLENOL ) tablet 650 mg  650 mg Oral Q6H PRN Gilman Lade, NP   650 mg at 10/13/23 0841   albuterol  (PROVENTIL ) (2.5 MG/3ML) 0.083% nebulizer solution 2.5 mg  2.5 mg Nebulization Q2H PRN Gilman Lade, NP       alum & mag hydroxide-simeth (MAALOX/MYLANTA) 200-200-20 MG/5ML suspension 30 mL  30 mL Oral Q4H PRN Lorrene Rosser, Veronique M, NP       apixaban  (ELIQUIS ) tablet 5 mg  5 mg Oral BID Marian Grandt, MD   5 mg at 10/13/23 2158   ARIPiprazole  (ABILIFY ) tablet 5 mg  5 mg Oral QHS Hazelle Woollard, MD   5 mg at 10/13/23 2158   bisacodyl  (DULCOLAX) EC tablet 5 mg  5 mg Oral Daily PRN Gilman Lade, NP       clopidogrel  (PLAVIX ) tablet 75  mg  75 mg Oral Daily Cejay Cambre, MD   75 mg at 10/13/23 4742   cyanocobalamin  (VITAMIN B12) tablet 1,000 mcg  1,000 mcg Oral Daily Adrianna Dudas, MD   1,000 mcg at 10/13/23 0841   gabapentin  (NEURONTIN ) capsule 400 mg  400 mg Oral TID Vani Gunner, MD   400 mg at 10/13/23 2157   insulin  aspart (novoLOG ) injection 0-15 Units  0-15 Units Subcutaneous TID WC Onuoha, Chinwendu V, NP   3 Units at 10/13/23 1714   insulin  aspart (novoLOG ) injection 0-5 Units  0-5 Units Subcutaneous QHS Onuoha, Chinwendu V, NP   5 Units at 10/13/23 2156   insulin  aspart (novoLOG ) injection 3 Units  3 Units Subcutaneous TID WC Haylo Fake, MD   3 Units at 10/13/23 1714   [START ON 10/14/2023] insulin  glargine-yfgn (SEMGLEE ) injection 20 Units  20 Units Subcutaneous Daily Michaella Imai, MD       magnesium  hydroxide (MILK OF MAGNESIA) suspension 30 mL  30 mL Oral Daily PRN Lorrene Rosser, Veronique M, NP       melatonin tablet 5 mg  5 mg Oral QHS PRN Demaria Deeney, MD   5 mg at 10/13/23 2157   nicotine  (NICODERM CQ  - dosed in mg/24 hours) patch 21 mg  21 mg Transdermal Daily Gilman Lade, NP   21 mg at 10/13/23 5956   ondansetron  (ZOFRAN )  tablet 4 mg  4 mg Oral Q6H PRN Gilman Lade, NP       Or   ondansetron  (ZOFRAN ) injection 4 mg  4 mg Intravenous Q6H PRN Lorrene Rosser, Veronique M, NP       venlafaxine  XR (EFFEXOR -XR) 24 hr capsule 150 mg  150 mg Oral Q breakfast Brea Coleson, MD   150 mg at 10/13/23 3875    Lab Results:  Results for orders placed or performed during the hospital encounter of 10/08/23 (from the past 48 hours)  Glucose, capillary     Status: Abnormal   Collection Time: 10/12/23  7:52 AM  Result Value Ref Range   Glucose-Capillary 215 (H) 70 - 99 mg/dL    Comment: Glucose reference range applies only to samples taken after fasting for at least 8 hours.  Glucose, capillary     Status: Abnormal   Collection Time: 10/12/23 11:57 AM  Result Value Ref Range   Glucose-Capillary 279 (H) 70 - 99 mg/dL    Comment: Glucose reference range applies only to samples taken after fasting for at least 8 hours.  Glucose, capillary     Status: Abnormal   Collection Time: 10/12/23  4:22 PM  Result Value Ref Range   Glucose-Capillary 238 (H) 70 - 99 mg/dL    Comment: Glucose reference range applies only to samples taken after fasting for at least 8 hours.  Glucose, capillary     Status: Abnormal   Collection Time: 10/12/23  8:13 PM  Result Value Ref Range   Glucose-Capillary 309 (H) 70 - 99 mg/dL    Comment: Glucose reference range applies only to samples taken after fasting for at least 8 hours.  Glucose, capillary     Status: Abnormal   Collection Time: 10/13/23  7:43 AM  Result Value Ref Range   Glucose-Capillary 258 (H) 70 - 99 mg/dL    Comment: Glucose reference range applies only to samples taken after fasting for at least 8 hours.  Glucose, capillary     Status: Abnormal   Collection Time: 10/13/23 11:46 AM  Result Value Ref Range   Glucose-Capillary  176 (H) 70 - 99 mg/dL    Comment: Glucose reference range applies only to samples taken after fasting for at least 8 hours.  Glucose, capillary      Status: Abnormal   Collection Time: 10/13/23  4:28 PM  Result Value Ref Range   Glucose-Capillary 196 (H) 70 - 99 mg/dL    Comment: Glucose reference range applies only to samples taken after fasting for at least 8 hours.  Glucose, capillary     Status: Abnormal   Collection Time: 10/13/23  7:47 PM  Result Value Ref Range   Glucose-Capillary 247 (H) 70 - 99 mg/dL    Comment: Glucose reference range applies only to samples taken after fasting for at least 8 hours.    Blood Alcohol level:  Lab Results  Component Value Date   ETH 182 (H) 10/07/2023   ETH <10 12/18/2020    Metabolic Disorder Labs: Lab Results  Component Value Date   HGBA1C 9.0 (H) 10/07/2023   MPG 211.6 10/07/2023   MPG 295 08/09/2021   No results found for: "PROLACTIN" Lab Results  Component Value Date   CHOL 245 (H) 10/07/2023   TRIG 425 (H) 10/07/2023   HDL 37 (L) 10/07/2023   CHOLHDL 6.6 10/07/2023   VLDL UNABLE TO CALCULATE IF TRIGLYCERIDE OVER 400 mg/dL 91/47/8295   LDLCALC UNABLE TO CALCULATE IF TRIGLYCERIDE OVER 400 mg/dL 62/13/0865   LDLCALC 70 06/22/2020    Physical Findings: AIMS:  , ,  ,  ,    CIWA:    COWS:      Psychiatric Specialty Exam:  Presentation  General Appearance:  Appropriate for Environment; Casual  Eye Contact: Fair  Speech: Clear and Coherent  Speech Volume: Normal    Mood and Affect  Mood: Depressed; Hopeless  Affect: Depressed; Flat   Thought Process  Thought Processes: Coherent  Descriptions of Associations:Intact  Orientation:Full (Time, Place and Person)  Thought Content:Logical  Hallucinations:Hallucinations: Visual  Ideas of Reference:None  Suicidal Thoughts:Suicidal Thoughts: No  Homicidal Thoughts:Homicidal Thoughts: No   Sensorium  Memory: Immediate Fair; Recent Fair; Remote Fair  Judgment: Impaired  Insight: Shallow   Executive Functions  Concentration: Fair  Attention Span: Fair  Recall: Fair  Fund of  Knowledge: Fair  Language: Fair   Psychomotor Activity  Psychomotor Activity: Psychomotor Activity: Normal  Musculoskeletal: Strength & Muscle Tone: within normal limits Gait & Station: normal Assets  Assets: Manufacturing systems engineer; Desire for Improvement; Resilience; Social Support    Physical Exam: Physical Exam Vitals and nursing note reviewed.  HENT:     Head: Normocephalic.  Cardiovascular:     Pulses: Normal pulses.  Pulmonary:     Effort: Pulmonary effort is normal.  Skin:    General: Skin is warm.  Neurological:     Mental Status: He is alert.    Review of Systems  Constitutional: Negative.   HENT: Negative.    Eyes: Negative.   Gastrointestinal: Negative.   Skin: Negative.   Neurological: Negative.    Blood pressure 120/73, pulse 77, temperature (!) 97.5 F (36.4 C), resp. rate 16, height 5\' 10"  (1.778 m), weight 81.6 kg, SpO2 100%. Body mass index is 25.83 kg/m.  Diagnosis: Principal Problem:   MDD (major depressive disorder), recurrent episode, severe (HCC)   Clinical Decision Making: Patient with history of depression presented to the emergency room with worsening depression and suicidal ideation in the context of multiple psychosocial stressors including not having access to medical care and housing.  Patient is an amputee,  wheelchair based and has not been taking his antidepressant medications as he had no access to care.  Patient is displaying impulsivity and mood lability and minimally engaged in the assessments.  Patient is hospitalization for further stabilization   Treatment Plan Summary:   Safety and Monitoring:             -- Voluntary admission to inpatient psychiatric unit for safety, stabilization and treatment             -- Daily contact with patient to assess and evaluate symptoms and progress in treatment             -- Patient's case to be discussed in multi-disciplinary team meeting             -- Observation Level: q15 minute  checks             -- Vital signs:  q12 hours             -- Precautions: suicide, elopement, and assault   2. Psychiatric Diagnoses and Treatment:               Effexor  XR 150 mg daily Abilify  5 mg nightly Patient reported this to medications working well before and gave consent to the medications.   -- The risks/benefits/side-effects/alternatives to this medication were discussed in detail with the patient and time was given for questions. The patient consents to medication trial.                -- Metabolic profile and EKG monitoring obtained while on an atypical antipsychotic (BMI: Lipid Panel: HbgA1c: QTc:)              -- Encouraged patient to participate in unit milieu and in scheduled group therapies                            3. Medical Issues Being Addressed:  Diabetic educator consult placed and then managing the insulin    4. Discharge Planning:              -- Social work and case management to assist with discharge planning and identification of hospital follow-up needs prior to discharge             -- Estimated LOS: 5-7 days             -- Discharge Concerns: Need to establish a safety plan; Medication compliance and effectiveness             -- Discharge Goals: Return home with outpatient referrals follow ups   Physician Treatment Plan for Primary Diagnosis: MDD (major depressive disorder), recurrent episode, severe (HCC) Long Term Goal(s): Improvement in symptoms so as ready for discharge   Short Term Goals: Ability to identify changes in lifestyle to reduce recurrence of condition will improve, Ability to verbalize feelings will improve, Ability to disclose and discuss suicidal ideas, Ability to demonstrate self-control will improve, and Ability to identify and develop effective coping behaviors will improve   Physician Treatment Plan for Secondary Diagnosis: Principal Problem:   MDD (major depressive disorder), recurrent episode, severe (HCC)   Long Term Goal(s):  Improvement in symptoms so as ready for discharge   Short Term Goals: Ability to identify changes in lifestyle to reduce recurrence of condition will improve, Ability to verbalize feelings will improve, Ability to disclose and discuss suicidal ideas, Ability to demonstrate self-control will improve, Ability to identify and develop effective  coping behaviors will improve, Ability to maintain clinical measurements within normal limits will improve, and Compliance with prescribed medications will improve    Aurelia Blotter, MD 10/13/2023, 10:20 PM

## 2023-10-13 NOTE — Plan of Care (Signed)
   Problem: Education: Goal: Ability to make informed decisions regarding treatment will improve Outcome: Progressing   Problem: Coping: Goal: Coping ability will improve Outcome: Progressing

## 2023-10-13 NOTE — Plan of Care (Signed)
 Timothy [name]. is a 63 y.o. male patient. No diagnosis found. Past Medical History:  Diagnosis Date   Anxiety    CHF (congestive heart failure) (HCC)    COPD (chronic obstructive pulmonary disease) (HCC)    Depression    Diabetes mellitus without complication (HCC)    GERD (gastroesophageal reflux disease)    Hypercholesteremia    Hypertension    Pneumonia    PTSD (post-traumatic stress disorder)    Current Facility-Administered Medications  Medication Dose Route Frequency Provider Last Rate Last Admin   acetaminophen  (TYLENOL ) tablet 650 mg  650 mg Oral Q6H PRN Gilman Lade, NP   650 mg at 10/11/23 2017   albuterol  (PROVENTIL ) (2.5 MG/3ML) 0.083% nebulizer solution 2.5 mg  2.5 mg Nebulization Q2H PRN Gilman Lade, NP       alum & mag hydroxide-simeth (MAALOX/MYLANTA) 200-200-20 MG/5ML suspension 30 mL  30 mL Oral Q4H PRN Lorrene Rosser, Veronique M, NP       apixaban  (ELIQUIS ) tablet 5 mg  5 mg Oral BID Jadapalle, Sree, MD   5 mg at 10/12/23 2124   ARIPiprazole  (ABILIFY ) tablet 5 mg  5 mg Oral QHS Jadapalle, Sree, MD   5 mg at 10/12/23 2124   bisacodyl  (DULCOLAX) EC tablet 5 mg  5 mg Oral Daily PRN Byungura, Veronique M, NP       clopidogrel  (PLAVIX ) tablet 75 mg  75 mg Oral Daily Jadapalle, Sree, MD   75 mg at 10/12/23 0900   cyanocobalamin  (VITAMIN B12) tablet 1,000 mcg  1,000 mcg Oral Daily Jadapalle, Sree, MD   1,000 mcg at 10/12/23 0900   gabapentin  (NEURONTIN ) capsule 400 mg  400 mg Oral TID Jadapalle, Sree, MD   400 mg at 10/12/23 2124   insulin  aspart (novoLOG ) injection 0-15 Units  0-15 Units Subcutaneous TID WC Onuoha, Chinwendu V, NP   5 Units at 10/12/23 1657   insulin  aspart (novoLOG ) injection 0-5 Units  0-5 Units Subcutaneous QHS Onuoha, Chinwendu V, NP   4 Units at 10/12/23 2123   insulin  aspart (novoLOG ) injection 3 Units  3 Units Subcutaneous TID WC Jadapalle, Sree, MD   3 Units at 10/12/23 1658   insulin  glargine-yfgn (SEMGLEE ) injection 15 Units  15  Units Subcutaneous Daily Jadapalle, Sree, MD   15 Units at 10/12/23 1610   magnesium  hydroxide (MILK OF MAGNESIA) suspension 30 mL  30 mL Oral Daily PRN Byungura, Veronique M, NP       melatonin tablet 5 mg  5 mg Oral QHS PRN Jadapalle, Sree, MD       nicotine  (NICODERM CQ  - dosed in mg/24 hours) patch 21 mg  21 mg Transdermal Daily Byungura, Veronique M, NP   21 mg at 10/12/23 9604   ondansetron  (ZOFRAN ) tablet 4 mg  4 mg Oral Q6H PRN Gilman Lade, NP       Or   ondansetron  (ZOFRAN ) injection 4 mg  4 mg Intravenous Q6H PRN Lorrene Rosser, Veronique M, NP       polyethylene glycol (MIRALAX  / GLYCOLAX ) packet 17 g  17 g Oral Daily Byungura, Veronique M, NP   17 g at 10/11/23 5409   venlafaxine  XR (EFFEXOR -XR) 24 hr capsule 150 mg  150 mg Oral Q breakfast Jadapalle, Sree, MD   150 mg at 10/12/23 0900   Allergies  Allergen Reactions   Bee Venom Anaphylaxis   Cheese Anaphylaxis, Swelling and Other (See Comments)    Blue Cheese only   Coffea Arabica Shortness Of Breath  Coffee Flavoring Agent (Non-Screening) Shortness Of Breath   Principal Problem:   MDD (major depressive disorder), recurrent episode, severe (HCC)  Blood pressure 127/74, pulse 83, temperature 97.7 F (36.5 C), resp. rate 17, height 5\' 10"  (1.778 m), weight 81.6 kg, SpO2 99%.  Pt. Denies HI & SI. Med compliant this shift.  Janeice Stegall B Katleen Carraway 10/13/2023

## 2023-10-13 NOTE — Progress Notes (Signed)
 Patient's dressing changed using silver hydrofiber and Mepilex.  No drainage.  No odor.

## 2023-10-13 NOTE — Progress Notes (Signed)
 Patient is pleasant and cooperative. Flat affect.  Endorses anxiety.  Denies SI/HI and AVH.  Denies depression.  Pain rated 5/10 in R BKA.  Reports he slept well.    Compliant with scheduled medications.  PRN pain medication given as ordered. Declined Miralax .  15 min checks in place for safety.  Patient isolated to room after breakfast.  Present in the milieu in the afternoon and evening. Appropriate interaction with peers and staff.

## 2023-10-13 NOTE — Progress Notes (Signed)
   10/13/23 2300  Psych Admission Type (Psych Patients Only)  Admission Status Voluntary  Psychosocial Assessment  Patient Complaints Anxiety  Eye Contact Fair  Facial Expression Flat  Affect Appropriate to circumstance  Speech Logical/coherent  Interaction Assertive  Motor Activity Slow  Appearance/Hygiene Unremarkable  Behavior Characteristics Cooperative;Appropriate to situation  Mood Pleasant  Thought Process  Coherency WDL  Content WDL  Delusions None reported or observed  Perception WDL  Hallucination None reported or observed  Judgment WDL  Confusion None  Danger to Self  Current suicidal ideation? Denies  Agreement Not to Harm Self Yes  Description of Agreement Verbal  Danger to Others  Danger to Others None reported or observed

## 2023-10-14 LAB — GLUCOSE, CAPILLARY
Glucose-Capillary: 397 mg/dL — ABNORMAL HIGH (ref 70–99)
Glucose-Capillary: 281 mg/dL — ABNORMAL HIGH (ref 70–99)
Glucose-Capillary: 170 mg/dL — ABNORMAL HIGH (ref 70–99)

## 2023-10-14 NOTE — BH IP Treatment Plan (Signed)
 Interdisciplinary Treatment and Diagnostic Plan Update  10/14/2023 Time of Session: 10:00 AM  Timothy [name]. MRN: [id number]  Principal Diagnosis: MDD (major depressive disorder), recurrent episode, severe (HCC)  Secondary Diagnoses: Principal Problem:   MDD (major depressive disorder), recurrent episode, severe (HCC)   Current Medications:  Current Facility-Administered Medications  Medication Dose Route Frequency Provider Last Rate Last Admin   acetaminophen  (TYLENOL ) tablet 650 mg  650 mg Oral Q6H PRN Gilman Lade, NP   650 mg at 10/13/23 0841   albuterol  (PROVENTIL ) (2.5 MG/3ML) 0.083% nebulizer solution 2.5 mg  2.5 mg Nebulization Q2H PRN Gilman Lade, NP       alum & mag hydroxide-simeth (MAALOX/MYLANTA) 200-200-20 MG/5ML suspension 30 mL  30 mL Oral Q4H PRN Lorrene Rosser, Veronique M, NP       apixaban  (ELIQUIS ) tablet 5 mg  5 mg Oral BID Jadapalle, Sree, MD   5 mg at 10/14/23 0945   ARIPiprazole  (ABILIFY ) tablet 5 mg  5 mg Oral BID Jadapalle, Sree, MD   5 mg at 10/14/23 0945   bisacodyl  (DULCOLAX) EC tablet 5 mg  5 mg Oral Daily PRN Byungura, Veronique M, NP       clopidogrel  (PLAVIX ) tablet 75 mg  75 mg Oral Daily Jadapalle, Sree, MD   75 mg at 10/14/23 0945   cyanocobalamin  (VITAMIN B12) tablet 1,000 mcg  1,000 mcg Oral Daily Jadapalle, Sree, MD   1,000 mcg at 10/14/23 0945   gabapentin  (NEURONTIN ) capsule 400 mg  400 mg Oral TID Jadapalle, Sree, MD   400 mg at 10/14/23 0945   insulin  aspart (novoLOG ) injection 0-15 Units  0-15 Units Subcutaneous TID WC Onuoha, Chinwendu V, NP   15 Units at 10/14/23 1137   insulin  aspart (novoLOG ) injection 0-5 Units  0-5 Units Subcutaneous QHS Onuoha, Chinwendu V, NP   5 Units at 10/13/23 2156   insulin  aspart (novoLOG ) injection 3 Units  3 Units Subcutaneous TID WC Jadapalle, Sree, MD   3 Units at 10/14/23 1137   insulin  glargine-yfgn (SEMGLEE ) injection 20 Units  20 Units Subcutaneous Daily Jadapalle, Sree, MD   20 Units at  10/14/23 9528   magnesium  hydroxide (MILK OF MAGNESIA) suspension 30 mL  30 mL Oral Daily PRN Byungura, Veronique M, NP       melatonin tablet 5 mg  5 mg Oral QHS PRN Jadapalle, Sree, MD   5 mg at 10/13/23 2157   nicotine  (NICODERM CQ  - dosed in mg/24 hours) patch 21 mg  21 mg Transdermal Daily Byungura, Veronique M, NP   21 mg at 10/14/23 1003   ondansetron  (ZOFRAN ) tablet 4 mg  4 mg Oral Q6H PRN Gilman Lade, NP       Or   ondansetron  (ZOFRAN ) injection 4 mg  4 mg Intravenous Q6H PRN Lorrene Rosser, Veronique M, NP       venlafaxine  XR (EFFEXOR -XR) 24 hr capsule 150 mg  150 mg Oral Q breakfast Jadapalle, Sree, MD   150 mg at 10/14/23 0945   PTA Medications: Medications Prior to Admission  Medication Sig Dispense Refill Last Dose/Taking   acetaminophen  (TYLENOL ) 650 MG CR tablet Take 1,300 mg by mouth every 8 (eight) hours as needed for pain.      apixaban  (ELIQUIS ) 5 MG TABS tablet Take 5 mg by mouth 2 (two) times daily. (Patient not taking: Reported on 10/07/2023)      aspirin  EC 81 MG tablet Take 1 tablet (81 mg total) by mouth daily. Swallow whole. (Patient not  taking: Reported on 12/10/2022) 30 tablet 0    blood glucose meter kit and supplies KIT Dispense based on patient and insurance preference. Use up to four times daily as directed. (FOR ICD-9 250.00, 250.01). 1 each 0    cloNIDine  (CATAPRES ) 0.1 MG tablet Take 1 tablet (0.1 mg total) by mouth at bedtime. (Patient not taking: Reported on 11/08/2022) 30 tablet 2    clopidogrel  (PLAVIX ) 75 MG tablet Take 1 tablet (75 mg total) by mouth daily. (Patient not taking: Reported on 12/10/2022) 30 tablet 0    cyanocobalamin  (VITAMIN B12) 1000 MCG tablet Take 1 tablet (1,000 mcg total) by mouth daily. (Patient not taking: Reported on 10/07/2023) 30 tablet 0    gabapentin  (NEURONTIN ) 800 MG tablet TAKE 1 TABLET BY MOUTH THREE TIMES A DAY 90 tablet 2    insulin  glargine (LANTUS ) 100 UNIT/ML Solostar Pen Inject 25 Units into the skin at bedtime.  (Patient not taking: Reported on 10/07/2023) 15 mL 0    Insulin  Pen Needle (PEN NEEDLES) 32G X 6 MM MISC 1 each by Does not apply route daily. 30 each 0    metFORMIN  (GLUCOPHAGE ) 1000 MG tablet TAKE ONE TABLET (1,000MG  TOTAL) BY MOUTH TWICE DAILY. (Patient not taking: Reported on 10/07/2023) 60 tablet 1    metoprolol  succinate (TOPROL -XL) 50 MG 24 hr tablet Take 50 mg by mouth daily.      mometasone -formoterol  (DULERA ) 100-5 MCG/ACT AERO Inhale 2 puffs into the lungs 2 (two) times daily. (Patient not taking: Reported on 10/07/2023) 1 each 0    pantoprazole  (PROTONIX ) 40 MG tablet TAKE ONE TABLET (40MG  TOTAL) BY MOUTH ONCE DAILY. (Patient taking differently: Take 40 mg by mouth daily.) 30 tablet 1    rosuvastatin  (CRESTOR ) 5 MG tablet TAKE ONE TABLET (5MG  TOTAL) BY MOUTH ONCE DAILY. (Patient not taking: Reported on 10/07/2023) 30 tablet 0    sulfamethoxazole -trimethoprim  (BACTRIM  DS) 800-160 MG tablet Take 1 tablet by mouth 2 (two) times daily. (Patient not taking: Reported on 10/07/2023) 28 tablet 0    venlafaxine  XR (EFFEXOR  XR) 150 MG 24 hr capsule Take 1 capsule (150 mg total) by mouth daily with breakfast. Patient to take medication with 75 mg tablet for (225 mg total). (Patient not taking: Reported on 10/07/2023) 30 capsule 2    venlafaxine  XR (EFFEXOR  XR) 75 MG 24 hr capsule Take 1 capsule (75 mg total) by mouth daily with breakfast. Take with one capsule daily with 150 mg capsule for total dose of 225 mg daily (Patient not taking: Reported on 10/07/2023) 30 capsule 1     Patient Stressors: Medication change or noncompliance   Traumatic event    Patient Strengths: Forensic psychologist fund of knowledge  Motivation for treatment/growth   Treatment Modalities: Medication Management, Group therapy, Case management,  1 to 1 session with clinician, Psychoeducation, Recreational therapy.   Physician Treatment Plan for Primary Diagnosis: MDD (major depressive disorder), recurrent episode,  severe (HCC) Long Term Goal(s): Improvement in symptoms so as ready for discharge   Short Term Goals: Ability to identify changes in lifestyle to reduce recurrence of condition will improve Ability to verbalize feelings will improve Ability to disclose and discuss suicidal ideas Ability to demonstrate self-control will improve Ability to identify and develop effective coping behaviors will improve Ability to maintain clinical measurements within normal limits will improve Compliance with prescribed medications will improve  Medication Management: Evaluate patient's response, side effects, and tolerance of medication regimen.  Therapeutic Interventions: 1 to 1 sessions, Unit Group  sessions and Medication administration.  Evaluation of Outcomes: Progressing  Physician Treatment Plan for Secondary Diagnosis: Principal Problem:   MDD (major depressive disorder), recurrent episode, severe (HCC)  Long Term Goal(s): Improvement in symptoms so as ready for discharge   Short Term Goals: Ability to identify changes in lifestyle to reduce recurrence of condition will improve Ability to verbalize feelings will improve Ability to disclose and discuss suicidal ideas Ability to demonstrate self-control will improve Ability to identify and develop effective coping behaviors will improve Ability to maintain clinical measurements within normal limits will improve Compliance with prescribed medications will improve     Medication Management: Evaluate patient's response, side effects, and tolerance of medication regimen.  Therapeutic Interventions: 1 to 1 sessions, Unit Group sessions and Medication administration.  Evaluation of Outcomes: Progressing   RN Treatment Plan for Primary Diagnosis: MDD (major depressive disorder), recurrent episode, severe (HCC) Long Term Goal(s): Knowledge of disease and therapeutic regimen to maintain health will improve  Short Term Goals: Ability to remain free  from injury will improve, Ability to verbalize frustration and anger appropriately will improve, Ability to demonstrate self-control, Ability to participate in decision making will improve, Ability to verbalize feelings will improve, Ability to disclose and discuss suicidal ideas, Ability to identify and develop effective coping behaviors will improve, and Compliance with prescribed medications will improve  Medication Management: RN will administer medications as ordered by provider, will assess and evaluate patient's response and provide education to patient for prescribed medication. RN will report any adverse and/or side effects to prescribing provider.  Therapeutic Interventions: 1 on 1 counseling sessions, Psychoeducation, Medication administration, Evaluate responses to treatment, Monitor vital signs and CBGs as ordered, Perform/monitor CIWA, COWS, AIMS and Fall Risk screenings as ordered, Perform wound care treatments as ordered.  Evaluation of Outcomes: Progressing   LCSW Treatment Plan for Primary Diagnosis: MDD (major depressive disorder), recurrent episode, severe (HCC) Long Term Goal(s): Safe transition to appropriate next level of care at discharge, Engage patient in therapeutic group addressing interpersonal concerns.  Short Term Goals: Engage patient in aftercare planning with referrals and resources, Increase social support, Increase ability to appropriately verbalize feelings, Increase emotional regulation, Facilitate acceptance of mental health diagnosis and concerns, Facilitate patient progression through stages of change regarding substance use diagnoses and concerns, Identify triggers associated with mental health/substance abuse issues, and Increase skills for wellness and recovery  Therapeutic Interventions: Assess for all discharge needs, 1 to 1 time with Social worker, Explore available resources and support systems, Assess for adequacy in community support network, Educate  family and significant other(s) on suicide prevention, Complete Psychosocial Assessment, Interpersonal group therapy.  Evaluation of Outcomes: Progressing   Progress in Treatment: Attending groups: No. Participating in groups: No. Taking medication as prescribed: Yes. Toleration medication: Yes. Family/Significant other contact made: No, will contact:  CSW will contact if given permission  Patient understands diagnosis: Yes. Discussing patient identified problems/goals with staff: Yes. Medical problems stabilized or resolved: Yes. Denies suicidal/homicidal ideation: Yes. Issues/concerns per patient self-inventory: No. Other: None   New problem(s) identified: No, Describe:  None identified Update 10/14/23: No changes at this time    New Short Term/Long Term Goal(s):  elimination of symptoms of psychosis, medication management for mood stabilization; elimination of SI thoughts; development of comprehensive mental wellness plan. Update 10/14/23: No changes at this time    Patient Goals:  "I'm looking to get on [address], getting my own money, getting off the street, I don't want to be homeless" Update  10/14/23: No changes at this time    Discharge Plan or Barriers: CSW will assist with appropriate discharge planning Update 10/14/23: No changes at this time    Reason for Continuation of Hospitalization: Depression Medication stabilization   Estimated Length of Stay: 1 to 7 days Update 10/14/23: 10/16/2023  Last 3 [place] Suicide Severity Risk Score: Flowsheet Row Admission (Current) from 10/08/2023 in [facility] ED to Hosp-Admission (Discharged) from 10/07/2023 in [facility] Emergency Department at [facility] ED to Hosp-Admission (Discharged) from 11/02/2022 in [facility] (1A)  C-SSRS RISK CATEGORY Moderate Risk Moderate Risk No Risk       Last PHQ 2/9 Scores:    10/03/2022   11:17 AM 09/05/2022   11:45 AM 08/10/2022     2:38 PM  Depression screen PHQ 2/9  Decreased Interest 3 3 3   Down, Depressed, Hopeless 3 2 3   PHQ - 2 Score 6 5 6   Altered sleeping 3 3 3   Tired, decreased energy 3 1 3   Change in appetite 2 2 2   Feeling bad or failure about yourself  3 3 3   Trouble concentrating 2 1 2   Moving slowly or fidgety/restless 1 1 1   Suicidal thoughts 3 2 3   PHQ-9 Score 23 18 23   Difficult doing work/chores Extremely dIfficult Very difficult Extremely dIfficult    Scribe for Treatment Team: Claudio Culver, Milinda Allen 10/14/2023 4:44 PM

## 2023-10-14 NOTE — B[facility] Counselor (Signed)
 CSW contacted [employer], [phone].   CSW sent to VM, unable to leave VM requesting return call.   CSW contacted secondary number for [facility], [phone]  CSW was told that Janiece Meiers who has access to pt's case manager information was out for another hour.   CSW will call back to speak with Janiece Meiers at a later time.   Derrill Flirt, MSW, [place] 10/14/2023 1:49 PM

## 2023-10-14 NOTE — [facility] Counselor (Signed)
 CSW contacted pt's primary care at [facility] [phone]) to discuss getting a home health referral.   CSW ask to speak with PCP's nurse.   CSW left message with front desk receptionist who reports she will give it to Dr.Bronstein's nurse.   CSW awaits return call at this time.   Derrill Flirt, MSW, [place] 10/14/2023 3:54 PM

## 2023-10-14 NOTE — Group Note (Signed)
 Date:  10/14/2023 Time:  9:33 PM  Group Topic/Focus:  Wrap-Up Group:   The focus of this group is to help patients review their daily goal of treatment and discuss progress on daily workbooks.    Participation Level:  Active  Participation Quality:  Appropriate  Affect:  Appropriate  Cognitive:  Appropriate  Insight: Appropriate  Engagement in Group:  Engaged  Modes of Intervention:  Discussion  Additional Comments:    Rolland Cline 10/14/2023, 9:33 PM

## 2023-10-14 NOTE — Progress Notes (Signed)
 [facility] MD Progress Note  10/14/2023 4:32 PM Timothy [name].  MRN:  [id number]  Timothy [name], Timothy [name]. Is a 63 year old male with a history of PTSD, major depressive disorder, bipolar disorder, anxiety, diabetes, hypertension, hyperlipidemia, right BKA, COPD who presents to the ED with suicidal ideation. Ethanol 182 at 0041 on 4/21. Patient is admitted to [facility] unit with Q15 min safety monitoring. Multidisciplinary team approach is offered. Medication management; group/milieu therapy is offered.   Subjective:  Chart reviewed, case discussed in multidisciplinary meeting, patient seen during rounds.  Patient is noted to be resting in his bed.  He reports melatonin making him too sleepy or drowsy.  Provider discussed about reducing the melatonin to 3 mg tonight and see if that is helpful.  Provider and patient discussed his Abilify  dosing twice daily for mood stabilization.  He reports his mood as good.  He denies auditory hallucinations but continues to have the visions.  He denies suicidal ideations.  Provider and patient discussed about patient being stabilized and transition out to outpatient services.    Sleep: Fair  Appetite:  Fair  Past Psychiatric History: see h&P Family History:  Family History  Family history unknown: Yes   Social History:  Social History   Substance and Sexual Activity  Alcohol Use Yes   Alcohol/week: 6.0 standard drinks of alcohol   Types: 6 Cans of beer per week   Comment: 1-2 times weekly; 4 "40s"     Social History   Substance and Sexual Activity  Drug Use Not Currently    Social History   Socioeconomic History   Marital status: Single    Spouse name: Not on file   Number of children: 0   Years of education: Not on file   Highest education level: Not on file  Occupational History   Occupation: Unemployed  Tobacco Use   Smoking status: Every Day    Current packs/day: 1.00    Average packs/day: 1 pack/day for 41.0 years (41.0 ttl pk-yrs)     Types: Cigarettes   Smokeless tobacco: Never   Tobacco comments:    1 pack a day reported but is trying to cut back to stop  Vaping Use   Vaping status: Never Used  Substance and Sexual Activity   Alcohol use: Yes    Alcohol/week: 6.0 standard drinks of alcohol    Types: 6 Cans of beer per week    Comment: 1-2 times weekly; 4 "40s"   Drug use: Not Currently   Sexual activity: Not Currently  Other Topics Concern   Not on file  Social History Narrative   Lives with 1 roommate   Social Drivers of Health   Financial Resource Strain: Medium Risk (05/28/2022)   Overall Financial Resource Strain (CARDIA)    Difficulty of Paying Living Expenses: Somewhat hard  Food Insecurity: No Food Insecurity (10/08/2023)   Hunger Vital Sign    Worried About Running Out of Food in the Last Year: Never true    Ran Out of Food in the Last Year: Never true  Transportation Needs: No Transportation Needs (10/08/2023)   PRAPARE - Administrator, Civil Service (Medical): No    Lack of Transportation (Non-Medical): No  Physical Activity: Inactive (05/28/2022)   Exercise Vital Sign    Days of Exercise per Week: 0 days    Minutes of Exercise per Session: 0 min  Stress: Stress Concern Present (05/28/2022)   [employer] of Occupational Health - Occupational Stress  Questionnaire    Feeling of Stress : Very much  Social Connections: Moderately Isolated (10/07/2023)   Social Connection and Isolation Panel [NHANES]    Frequency of Communication with Friends and Family: Twice a week    Frequency of Social Gatherings with Friends and Family: Once a week    Attends Religious Services: Never    Database administrator or Organizations: No    Attends Engineer, structural: Never    Marital Status: Living with partner   Past Medical History:  Past Medical History:  Diagnosis Date   Anxiety    CHF (congestive heart failure) (HCC)    COPD (chronic obstructive pulmonary disease) (HCC)     Depression    Diabetes mellitus without complication (HCC)    GERD (gastroesophageal reflux disease)    Hypercholesteremia    Hypertension    Pneumonia    PTSD (post-traumatic stress disorder)     Past Surgical History:  Procedure Laterality Date   AMPUTATION Left 03/22/2020   Procedure: AMPUTATION RAY-1st Ray Left Foot;  Surgeon: Angel Barba, [name];  Location: [facility] OR[facility];  Service: Podiatry;  Laterality: Left;   AMPUTATION Right 11/07/2022   Procedure: AMPUTATION BELOW KNEE;  Surgeon: Celso College, MD;  Location: [facility] OR[facility];  Service: Vascular;  Laterality: Right;   AMPUTATION TOE Left 03/16/2020   Procedure: AMPUTATION GREAT TOE;  Surgeon: Angel Barba, [name];  Location: [facility];  Service: Podiatry;  Laterality: Left;   AMPUTATION TOE Left 04/06/2022   Procedure: AMPUTATION TOE;  Surgeon: Angel Barba, [name];  Location: [facility];  Service: Podiatry;  Laterality: Left;   COLONOSCOPY WITH PROPOFOL  N/A 12/22/2019   Procedure: COLONOSCOPY WITH PROPOFOL ;  Surgeon: Marnee Sink, MD;  Location: [facility];  Service: Endoscopy;  Laterality: N/A;   INCISION AND DRAINAGE ABSCESS Left 08/11/2021   Procedure: INCISION AND DRAINAGE ABSCESS;  Surgeon: Lesly Raspberry, MD;  Location: [facility];  Service: ENT;  Laterality: Left;   LOWER EXTREMITY ANGIOGRAPHY Left 03/18/2020   Procedure: Lower Extremity Angiography;  Surgeon: Jackquelyn Mass, MD;  Location: [facility];  Service: Cardiovascular;  Laterality: Left;   LOWER EXTREMITY ANGIOGRAPHY Right 11/05/2022   Procedure: Lower Extremity Angiography;  Surgeon: Celso College, MD;  Location: [facility];  Service: Cardiovascular;  Laterality: Right;   TONSILLECTOMY     VIDEO ASSISTED THORACOSCOPY (VATS)/DECORTICATION Left 08/15/2020   Procedure: VIDEO ASSISTED THORACOSCOPY (VATS)/DECORTICATION;  Surgeon: Hilarie Lovely, MD;  Location: [facility];  Service: Thoracic;  Laterality: Left;   WISDOM TOOTH EXTRACTION      Current Medications: Current  Facility-Administered Medications  Medication Dose Route Frequency Provider Last Rate Last Admin   acetaminophen  (TYLENOL ) tablet 650 mg  650 mg Oral Q6H PRN Gilman Lade, NP   650 mg at 10/13/23 0841   albuterol  (PROVENTIL ) (2.5 MG/3ML) 0.083% nebulizer solution 2.5 mg  2.5 mg Nebulization Q2H PRN Gilman Lade, NP       alum & mag hydroxide-simeth (MAALOX/MYLANTA) 200-200-20 MG/5ML suspension 30 mL  30 mL Oral Q4H PRN Lorrene Rosser, Veronique M, NP       apixaban  (ELIQUIS ) tablet 5 mg  5 mg Oral BID Artemis Loyal, MD   5 mg at 10/14/23 0945   ARIPiprazole  (ABILIFY ) tablet 5 mg  5 mg Oral BID Darriana Deboy, MD   5 mg at 10/14/23 0945   bisacodyl  (DULCOLAX) EC tablet 5 mg  5 mg Oral Daily PRN Gilman Lade, NP  clopidogrel  (PLAVIX ) tablet 75 mg  75 mg Oral Daily Cornelis Kluver, MD   75 mg at 10/14/23 0945   cyanocobalamin  (VITAMIN B12) tablet 1,000 mcg  1,000 mcg Oral Daily Mahogani Holohan, MD   1,000 mcg at 10/14/23 0945   gabapentin  (NEURONTIN ) capsule 400 mg  400 mg Oral TID Jazlyne Gauger, MD   400 mg at 10/14/23 0945   insulin  aspart (novoLOG ) injection 0-15 Units  0-15 Units Subcutaneous TID WC Onuoha, Chinwendu V, NP   15 Units at 10/14/23 1137   insulin  aspart (novoLOG ) injection 0-5 Units  0-5 Units Subcutaneous QHS Onuoha, Chinwendu V, NP   5 Units at 10/13/23 2156   insulin  aspart (novoLOG ) injection 3 Units  3 Units Subcutaneous TID WC Mattheus Rauls, MD   3 Units at 10/14/23 1137   insulin  glargine-yfgn (SEMGLEE ) injection 20 Units  20 Units Subcutaneous Daily Gearldean Lomanto, MD   20 Units at 10/14/23 7846   magnesium  hydroxide (MILK OF MAGNESIA) suspension 30 mL  30 mL Oral Daily PRN Byungura, Veronique M, NP       melatonin tablet 5 mg  5 mg Oral QHS PRN Karnisha Lefebre, MD   5 mg at 10/13/23 2157   nicotine  (NICODERM CQ  - dosed in mg/24 hours) patch 21 mg  21 mg Transdermal Daily Byungura, Veronique M, NP   21 mg at 10/14/23 1003   ondansetron   (ZOFRAN ) tablet 4 mg  4 mg Oral Q6H PRN Gilman Lade, NP       Or   ondansetron  (ZOFRAN ) injection 4 mg  4 mg Intravenous Q6H PRN Lorrene Rosser, Veronique M, NP       venlafaxine  XR (EFFEXOR -XR) 24 hr capsule 150 mg  150 mg Oral Q breakfast Thaddius Manes, MD   150 mg at 10/14/23 0945    Lab Results:  Results for orders placed or performed during the hospital encounter of 10/08/23 (from the past 48 hours)  Glucose, capillary     Status: Abnormal   Collection Time: 10/12/23  8:13 PM  Result Value Ref Range   Glucose-Capillary 309 (H) 70 - 99 mg/dL    Comment: Glucose reference range applies only to samples taken after fasting for at least 8 hours.  Glucose, capillary     Status: Abnormal   Collection Time: 10/13/23  7:43 AM  Result Value Ref Range   Glucose-Capillary 258 (H) 70 - 99 mg/dL    Comment: Glucose reference range applies only to samples taken after fasting for at least 8 hours.  Glucose, capillary     Status: Abnormal   Collection Time: 10/13/23 11:46 AM  Result Value Ref Range   Glucose-Capillary 176 (H) 70 - 99 mg/dL    Comment: Glucose reference range applies only to samples taken after fasting for at least 8 hours.  Glucose, capillary     Status: Abnormal   Collection Time: 10/13/23  4:28 PM  Result Value Ref Range   Glucose-Capillary 196 (H) 70 - 99 mg/dL    Comment: Glucose reference range applies only to samples taken after fasting for at least 8 hours.  Glucose, capillary     Status: Abnormal   Collection Time: 10/13/23  7:47 PM  Result Value Ref Range   Glucose-Capillary 247 (H) 70 - 99 mg/dL    Comment: Glucose reference range applies only to samples taken after fasting for at least 8 hours.  Glucose, capillary     Status: Abnormal   Collection Time: 10/14/23 11:31 AM  Result Value Ref  Range   Glucose-Capillary 397 (H) 70 - 99 mg/dL    Comment: Glucose reference range applies only to samples taken after fasting for at least 8 hours.    Blood Alcohol  level:  Lab Results  Component Value Date   ETH 182 (H) 10/07/2023   ETH <10 12/18/2020    Metabolic Disorder Labs: Lab Results  Component Value Date   HGBA1C 9.0 (H) 10/07/2023   MPG 211.6 10/07/2023   MPG 295 08/09/2021   No results found for: "PROLACTIN" Lab Results  Component Value Date   CHOL 245 (H) 10/07/2023   TRIG 425 (H) 10/07/2023   HDL 37 (L) 10/07/2023   CHOLHDL 6.6 10/07/2023   VLDL UNABLE TO CALCULATE IF TRIGLYCERIDE OVER 400 mg/dL 40/98/1191   LDLCALC UNABLE TO CALCULATE IF TRIGLYCERIDE OVER 400 mg/dL 47/82/9562   LDLCALC 70 06/22/2020    Physical Findings: AIMS:  , ,  ,  ,    CIWA:    COWS:      Psychiatric Specialty Exam:  Presentation  General Appearance:  Appropriate for Environment; Casual  Eye Contact: Fair  Speech: Clear and Coherent  Speech Volume: Normal    Mood and Affect  Mood: Depressed; Hopeless  Affect: Depressed; Flat   Thought Process  Thought Processes: Coherent  Descriptions of Associations:Intact  Orientation:Full (Time, Place and Person)  Thought Content:Logical  Hallucinations:No data recorded  Ideas of Reference:None  Suicidal Thoughts:No data recorded  Homicidal Thoughts:No data recorded   Sensorium  Memory: Immediate Fair; Recent Fair; Remote Fair  Judgment: Impaired  Insight: Shallow   Executive Functions  Concentration: Fair  Attention Span: Fair  Recall: [employer] of Knowledge: Fair  Language: Fair   Psychomotor Activity  Psychomotor Activity: No data recorded  Musculoskeletal: Strength & Muscle Tone: within normal limits Gait & Station: normal Assets  Assets: Manufacturing systems engineer; Desire for Improvement; Resilience; Social Support    Physical Exam: Physical Exam Vitals and nursing note reviewed.  HENT:     Head: Normocephalic.  Cardiovascular:     Pulses: Normal pulses.  Pulmonary:     Effort: Pulmonary effort is normal.  Skin:    General: Skin is  warm.  Neurological:     Mental Status: He is alert.    Review of Systems  Constitutional: Negative.   HENT: Negative.    Eyes: Negative.   Gastrointestinal: Negative.   Skin: Negative.   Neurological: Negative.    Blood pressure 115/65, pulse 65, temperature (!) 97.3 F (36.3 C), resp. rate 18, height 5\' 10"  (1.778 m), weight 81.6 kg, SpO2 96%. Body mass index is 25.83 kg/m.  Diagnosis: Principal Problem:   MDD (major depressive disorder), recurrent episode, severe (HCC)   Clinical Decision Making: Patient with history of depression presented to the emergency room with worsening depression and suicidal ideation in the context of multiple psychosocial stressors including not having access to medical care and housing.  Patient is an amputee, wheelchair based and has not been taking his antidepressant medications as he had no access to care.  Patient is displaying impulsivity and mood lability and minimally engaged in the assessments.  Patient is hospitalization for further stabilization   Treatment Plan Summary:   Safety and Monitoring:             -- Voluntary admission to inpatient psychiatric unit for safety, stabilization and treatment             -- Daily contact with patient to assess and evaluate symptoms and  progress in treatment             -- Patient's case to be discussed in multi-disciplinary team meeting             -- Observation Level: q15 minute checks             -- Vital signs:  q12 hours             -- Precautions: suicide, elopement, and assault   2. Psychiatric Diagnoses and Treatment:               Effexor  XR 150 mg daily Increased Abilify  5 mg BID Patient reported this to medications working well before and gave consent to the medications.   -- The risks/benefits/side-effects/alternatives to this medication were discussed in detail with the patient and time was given for questions. The patient consents to medication trial.                -- Metabolic  profile and EKG monitoring obtained while on an atypical antipsychotic (BMI: Lipid Panel: HbgA1c: QTc:)              -- Encouraged patient to participate in unit milieu and in scheduled group therapies                            3. Medical Issues Being Addressed:  Diabetic educator consult placed and then managing the insulin    4. Discharge Planning:              -- Social work and case management to assist with discharge planning and identification of hospital follow-up needs prior to discharge             -- Estimated LOS: 5-7 days             -- Discharge Concerns: Need to establish a safety plan; Medication compliance and effectiveness             -- Discharge Goals: Return home with outpatient referrals follow ups   Physician Treatment Plan for Primary Diagnosis: MDD (major depressive disorder), recurrent episode, severe (HCC) Long Term Goal(s): Improvement in symptoms so as ready for discharge   Short Term Goals: Ability to identify changes in lifestyle to reduce recurrence of condition will improve, Ability to verbalize feelings will improve, Ability to disclose and discuss suicidal ideas, Ability to demonstrate self-control will improve, and Ability to identify and develop effective coping behaviors will improve   Physician Treatment Plan for Secondary Diagnosis: Principal Problem:   MDD (major depressive disorder), recurrent episode, severe (HCC)   Long Term Goal(s): Improvement in symptoms so as ready for discharge   Short Term Goals: Ability to identify changes in lifestyle to reduce recurrence of condition will improve, Ability to verbalize feelings will improve, Ability to disclose and discuss suicidal ideas, Ability to demonstrate self-control will improve, Ability to identify and develop effective coping behaviors will improve, Ability to maintain clinical measurements within normal limits will improve, and Compliance with prescribed medications will improve    Aurelia Blotter, MD 10/14/2023, 4:32 PM

## 2023-10-14 NOTE — Progress Notes (Signed)
   10/14/23 0753  Psych Admission Type (Psych Patients Only)  Admission Status Voluntary  Psychosocial Assessment  Patient Complaints Anxiety  Eye Contact Fair  Facial Expression Flat  Affect Appropriate to circumstance  Speech Logical/coherent  Interaction Assertive  Motor Activity Slow  Appearance/Hygiene Unremarkable  Behavior Characteristics Cooperative  Mood Pleasant  Thought Process  Coherency WDL  Content WDL  Delusions None reported or observed  Perception WDL  Hallucination None reported or observed  Judgment WDL  Confusion None  Danger to Self  Current suicidal ideation? Denies  Danger to Others  Danger to Others None reported or observed

## 2023-10-14 NOTE — [facility] Counselor (Signed)
 CSW received return call from Macdonald Savoy., pt's PCP's nurse.   Tammy reports that she will ask the doctor to put in an order for homehealth wound care and that the wound care should reach out to pt following his discharge tomorrow.   Derrill Flirt, MSW, [place] 10/15/2023 2:30 PM

## 2023-10-14 NOTE — [facility] Counselor (Signed)
 CSW contacted [employer] to get in touch with pt's care Production designer, theatre/television/film.   CSW spoke with Keona, who reports pt's care manager is through a company, [employer], [phone]  Keona reports to call that number to inquire regarding pt's case manager.   Call Reference ID [phone]

## 2023-10-14 NOTE — Group Note (Signed)
 Recreation Therapy Group Note   Group Topic:Coping Skills  Group Date: 10/14/2023 Start Time: 1500 End Time: 1600 Facilitators: Deatrice Factor, LRT, CTRS Location: Courtyard  Group Description: Mind Map.  Patient was provided a blank template of a diagram with 32 blank boxes in a tiered system, branching from the center (similar to a bubble chart). LRT directed patients to label the middle of the diagram "Coping Skills". LRT and patients then came up with 8 different coping skills as examples. Pt were directed to record their coping skills in the 2nd tier boxes closest to the center.  Patients would then share their coping skills with the group as LRT wrote them out. LRT gave a handout of 99 different coping skills at the end of group.   Goal Area(s) Addressed: Patients will be able to define "coping skills". Patient will identify new coping skills.  Patient will increase communication.   Affect/Mood: N/A   Participation Level: Did not attend    Clinical Observations/Individualized Feedback: Patient did not attend group.   Plan: Continue to engage patient in RT group sessions 2-3x/week.   Deatrice Factor, LRT, CTRS 10/14/2023 4:56 PM

## 2023-10-14 NOTE — Group Note (Signed)
 Date:  10/14/2023 Time:  11:50 AM  Group Topic/Focus:  Goals Group:   The focus of this group is to help patients establish daily goals to achieve during treatment and discuss how the patient can incorporate goal setting into their daily lives to aide in recovery.    Participation Level:  Active  Participation Quality:  Appropriate  Affect:  Appropriate  Cognitive:  Appropriate  Insight: Appropriate  Engagement in Group:  Engaged  Modes of Intervention:  Discussion   [name] 10/14/2023, 11:50 AM

## 2023-10-14 NOTE — [facility] Counselor (Signed)
 CSW contacted [employer], [phone] to speak with pt's care manager.   They report there is no one in the office at this time for CSW to talk to. They report the supervisor needed to leave for an emergency.   CSW left message with receptionist to have supervisor call CSW back.   CSW awaits return call at this time.   Derrill Flirt, MSW, [place] 10/14/2023 3:47 PM

## 2023-10-14 NOTE — [facility] Suicide Risk Assessment (Addendum)
 [facility] INPATIENT:  Family/Significant Other Suicide Prevention Education  Suicide Prevention Education:  Education Completed; Edythe Grand, friend, [phone],  (name of family member/significant other) has been identified by the patient as the family member/significant other with whom the patient will be residing, and identified as the person(s) who will aid the patient in the event of a mental health crisis (suicidal ideations/suicide attempt).  With written consent from the patient, the family member/significant other has been provided the following suicide prevention education, prior to the and/or following the discharge of the patient.  The suicide prevention education provided includes the following: Suicide risk factors Suicide prevention and interventions National Suicide Hotline telephone number [facility] assessment telephone number [facility] Emergency Assistance 911 [facility] and/or Residential Mobile Crisis Unit telephone number  Request made of family/significant other to: Remove weapons (e.g., guns, rifles, knives), all items previously/currently identified as safety concern.   Remove drugs/medications (over-the-counter, prescriptions, illicit drugs), all items previously/currently identified as a safety concern.  The family member/significant other verbalizes understanding of the suicide prevention education information provided.  The family member/significant other agrees to remove the items of safety concern listed above.  Claudio Culver 10/14/2023, 2:03 PM

## 2023-10-14 NOTE — Plan of Care (Signed)
 Problem: Activity: Goal: Will identify at least one activity in which they can participate Outcome: Progressing   Problem: Coping: Goal: Ability to identify and develop effective coping behavior will improve Outcome: Progressing Goal: Ability to interact with others will improve Outcome: Progressing Goal: Demonstration of participation in decision-making regarding own care will improve Outcome: Progressing Goal: Ability to use eye contact when communicating with others will improve Outcome: Progressing   Problem: Health Behavior/Discharge Planning: Goal: Identification of resources available to assist in meeting health care needs will improve Outcome: Progressing   Problem: Self-Concept: Goal: Will verbalize positive feelings about self Outcome: Progressing   Problem: Education: Goal: Ability to make informed decisions regarding treatment will improve Outcome: Progressing   Problem: Coping: Goal: Coping ability will improve Outcome: Progressing   Problem: Health Behavior/Discharge Planning: Goal: Identification of resources available to assist in meeting health care needs will improve Outcome: Progressing   Problem: Medication: Goal: Compliance with prescribed medication regimen will improve Outcome: Progressing   Problem: Self-Concept: Goal: Ability to disclose and discuss suicidal ideas will improve Outcome: Progressing Goal: Will verbalize positive feelings about self Outcome: Progressing Note: Patient is on track. Patient will maintain adherence    Problem: Education: Goal: Knowledge of [facility] Education information/materials will improve Outcome: Progressing Goal: Emotional status will improve Outcome: Progressing Goal: Mental status will improve Outcome: Progressing Goal: Verbalization of understanding the information provided will improve Outcome: Progressing   Problem: Activity: Goal: Interest or engagement in activities will  improve Outcome: Progressing Goal: Sleeping patterns will improve Outcome: Progressing   Problem: Coping: Goal: Ability to verbalize frustrations and anger appropriately will improve Outcome: Progressing Goal: Ability to demonstrate self-control will improve Outcome: Progressing   Problem: Health Behavior/Discharge Planning: Goal: Identification of resources available to assist in meeting health care needs will improve Outcome: Progressing Goal: Compliance with treatment plan for underlying cause of condition will improve Outcome: Progressing   Problem: Physical Regulation: Goal: Ability to maintain clinical measurements within normal limits will improve Outcome: Progressing   Problem: Safety: Goal: Periods of time without injury will increase Outcome: Progressing   Problem: Education: Goal: Utilization of techniques to improve thought processes will improve Outcome: Progressing Goal: Knowledge of the prescribed therapeutic regimen will improve Outcome: Progressing   Problem: Activity: Goal: Interest or engagement in leisure activities will improve Outcome: Progressing Goal: Imbalance in normal sleep/wake cycle will improve Outcome: Progressing   Problem: Coping: Goal: Coping ability will improve Outcome: Progressing Goal: Will verbalize feelings Outcome: Progressing   Problem: Health Behavior/Discharge Planning: Goal: Ability to make decisions will improve Outcome: Progressing Goal: Compliance with therapeutic regimen will improve Outcome: Progressing   Problem: Role Relationship: Goal: Will demonstrate positive changes in social behaviors and relationships Outcome: Progressing   Problem: Safety: Goal: Ability to disclose and discuss suicidal ideas will improve Outcome: Progressing Goal: Ability to identify and utilize support systems that promote safety will improve Outcome: Progressing   Problem: Self-Concept: Goal: Will verbalize positive feelings about  self Outcome: Progressing Goal: Level of anxiety will decrease Outcome: Progressing   Problem: Education: Goal: Ability to state activities that reduce stress will improve Outcome: Progressing   Problem: Coping: Goal: Ability to identify and develop effective coping behavior will improve Outcome: Progressing   Problem: Self-Concept: Goal: Ability to identify factors that promote anxiety will improve Outcome: Progressing Goal: Level of anxiety will decrease Outcome: Progressing Goal: Ability to modify response to factors that promote anxiety will improve Outcome: Progressing   Problem: Education: Goal: Ability to  describe self-care measures that may prevent or decrease complications (Diabetes Survival Skills Education) will improve Outcome: Progressing Goal: Individualized Educational Video(s) Outcome: Progressing   Problem: Coping: Goal: Ability to adjust to condition or change in health will improve Outcome: Progressing   Problem: Fluid Volume: Goal: Ability to maintain a balanced intake and output will improve Outcome: Progressing

## 2023-10-15 LAB — GLUCOSE, CAPILLARY
Glucose-Capillary: 224 mg/dL — ABNORMAL HIGH (ref 70–99)
Glucose-Capillary: 224 mg/dL — ABNORMAL HIGH (ref 70–99)
Glucose-Capillary: 241 mg/dL — ABNORMAL HIGH (ref 70–99)
Glucose-Capillary: 236 mg/dL — ABNORMAL HIGH (ref 70–99)
Glucose-Capillary: 212 mg/dL — ABNORMAL HIGH (ref 70–99)

## 2023-10-15 MED ORDER — INSULIN GLARGINE-YFGN 100 UNIT/ML ~~LOC~~ SOLN
20.0000 [IU] | Freq: Every day | SUBCUTANEOUS | Status: DC
  Administered 2023-10-15: 20 [IU] via SUBCUTANEOUS
  Filled 2023-10-15: qty 0.2

## 2023-10-15 MED ORDER — LOPERAMIDE HCL 2 MG PO CAPS
2.0000 mg | ORAL_CAPSULE | ORAL | Status: DC | PRN
  Administered 2023-10-15 – 2023-10-16 (×2): 2 mg via ORAL
  Filled 2023-10-15 (×2): qty 1

## 2023-10-15 NOTE — Group Note (Signed)
 Date:  10/15/2023 Time:  8:44 PM  Group Topic/Focus:  Wrap-Up Group:   The focus of this group is to help patients review their daily goal of treatment and discuss progress on daily workbooks.    Participation Level:  Active  Participation Quality:  Appropriate, Attentive, and Sharing  Affect:  Appropriate  Cognitive:  Alert and Appropriate  Insight: Appropriate, Good, and Improving  Engagement in Group:  Engaged and Improving  Modes of Intervention:  Discussion, Rapport Building, Socialization, and Support  Additional Comments:     Andie Mungin 10/15/2023, 8:44 PM

## 2023-10-15 NOTE — Progress Notes (Signed)
   10/14/23 2000  Psych Admission Type (Psych Patients Only)  Admission Status Voluntary  Psychosocial Assessment  Patient Complaints Anxiety  Eye Contact Fair  Facial Expression Flat  Affect Appropriate to circumstance  Speech Logical/coherent  Interaction Assertive  Motor Activity Slow  Appearance/Hygiene Unremarkable  Behavior Characteristics Cooperative  Mood Pleasant  Thought Process  Coherency WDL  Content WDL  Delusions None reported or observed  Perception Hallucinations  Hallucination Visual  Judgment Impaired  Confusion None  Danger to Self  Current suicidal ideation? Denies    24

## 2023-10-15 NOTE — Group Note (Signed)
 Recreation Therapy Group Note   Group Topic:Coping Skills  Group Date: 10/15/2023 Start Time: 1100 End Time: 1140 Facilitators: Deatrice Factor, LRT, CTRS Location:  Dayroom  Group Description: Seated Exercise. LRT discussed the mental and physical benefits of exercise. LRT and group discussed how physical activity can be used as a coping skill. Pt's and LRT followed along to an exercise video on the TV screen that provided a visual representation and audio description of every exercise performed. Pt's encouraged to listen to their bodies and stop at any time if they experience feelings of discomfort or pain. Pts were encouraged to drink water and stay hydrated.   Goal Area(s) Addressed: Patient will learn benefits of physical activity. Patient will identify exercise as a coping skill.  Patient will follow multistep directions. Patient will try a new leisure interest.    Affect/Mood: Appropriate   Participation Level: Active and Engaged   Participation Quality: Independent   Behavior: Calm and Cooperative   Speech/Thought Process: Coherent   Insight: Good   Judgement: Good   Modes of Intervention: Activity, Education, and Exploration   Patient Response to Interventions:  Attentive, Engaged, Interested , and Receptive   Education Outcome:  Acknowledges education   Clinical Observations/Individualized Feedback: [place] was active in their participation of session activities and group discussion. Pt completed all exercises as prompted. Pt interacted well with LRT and peers duration of session.    Plan: Continue to engage patient in RT group sessions 2-3x/week.   [address], LRT, CTRS 10/15/2023 1:23 PM

## 2023-10-15 NOTE — Progress Notes (Signed)
 [facility] MD Progress Note  10/15/2023 9:49 PM Timothy [name].  MRN:  [id number]  [name], [name]. Is a 63 year old male with a history of PTSD, major depressive disorder, bipolar disorder, anxiety, diabetes, hypertension, hyperlipidemia, right BKA, COPD who presents to the ED with suicidal ideation. Ethanol 182 at 0041 on 4/21. Patient is admitted to [facility] unit with Q15 min safety monitoring. Multidisciplinary team approach is offered. Medication management; group/milieu therapy is offered.   Subjective:  Chart reviewed, case discussed in multidisciplinary meeting, patient seen during rounds.  Patient is noted to be sitting in the day area.  He is in no good mood.  He rates his depression as 6 out of 10, 10 being the worst and anxiety 6 out of 10.  Denies SI/HI/plan.  He denies hallucinations.  He is aware of transition to outpatient.  He remains future oriented.  Sleep: Fair  Appetite:  Fair  Past Psychiatric History: see h&P Family History:  Family History  Family history unknown: Yes   Social History:  Social History   Substance and Sexual Activity  Alcohol Use Yes   Alcohol/week: 6.0 standard drinks of alcohol   Types: 6 Cans of beer per week   Comment: 1-2 times weekly; 4 "40s"     Social History   Substance and Sexual Activity  Drug Use Not Currently    Social History   Socioeconomic History   Marital status: Single    Spouse name: Not on file   Number of children: 0   Years of education: Not on file   Highest education level: Not on file  Occupational History   Occupation: Unemployed  Tobacco Use   Smoking status: Every Day    Current packs/day: 1.00    Average packs/day: 1 pack/day for 41.0 years (41.0 ttl pk-yrs)    Types: Cigarettes   Smokeless tobacco: Never   Tobacco comments:    1 pack a day reported but is trying to cut back to stop  Vaping Use   Vaping status: Never Used  Substance and Sexual Activity   Alcohol use: Yes    Alcohol/week: 6.0  standard drinks of alcohol    Types: 6 Cans of beer per week    Comment: 1-2 times weekly; 4 "40s"   Drug use: Not Currently   Sexual activity: Not Currently  Other Topics Concern   Not on file  Social History Narrative   Lives with 1 roommate   Social Drivers of Health   Financial Resource Strain: Medium Risk (05/28/2022)   Overall Financial Resource Strain (CARDIA)    Difficulty of Paying Living Expenses: Somewhat hard  Food Insecurity: No Food Insecurity (10/08/2023)   Hunger Vital Sign    Worried About Running Out of Food in the Last Year: Never true    Ran Out of Food in the Last Year: Never true  Transportation Needs: No Transportation Needs (10/08/2023)   PRAPARE - Administrator, Civil Service (Medical): No    Lack of Transportation (Non-Medical): No  Physical Activity: Inactive (05/28/2022)   Exercise Vital Sign    Days of Exercise per Week: 0 days    Minutes of Exercise per Session: 0 min  Stress: Stress Concern Present (05/28/2022)   [employer] of Occupational Health - Occupational Stress Questionnaire    Feeling of Stress : Very much  Social Connections: Moderately Isolated (10/07/2023)   Social Connection and Isolation Panel [NHANES]    Frequency of Communication with Friends  and Family: Twice a week    Frequency of Social Gatherings with Friends and Family: Once a week    Attends Religious Services: Never    Database administrator or Organizations: No    Attends Engineer, structural: Never    Marital Status: Living with partner   Past Medical History:  Past Medical History:  Diagnosis Date   Anxiety    CHF (congestive heart failure) (HCC)    COPD (chronic obstructive pulmonary disease) (HCC)    Depression    Diabetes mellitus without complication (HCC)    GERD (gastroesophageal reflux disease)    Hypercholesteremia    Hypertension    Pneumonia    PTSD (post-traumatic stress disorder)     Past Surgical History:  Procedure  Laterality Date   AMPUTATION Left 03/22/2020   Procedure: AMPUTATION RAY-1st Ray Left Foot;  Surgeon: Angel Barba, [name];  Location: [facility] OR[facility];  Service: Podiatry;  Laterality: Left;   AMPUTATION Right 11/07/2022   Procedure: AMPUTATION BELOW KNEE;  Surgeon: Celso College, MD;  Location: [facility] OR[facility];  Service: Vascular;  Laterality: Right;   AMPUTATION TOE Left 03/16/2020   Procedure: AMPUTATION GREAT TOE;  Surgeon: Angel Barba, [name];  Location: [facility];  Service: Podiatry;  Laterality: Left;   AMPUTATION TOE Left 04/06/2022   Procedure: AMPUTATION TOE;  Surgeon: Angel Barba, [name];  Location: [facility];  Service: Podiatry;  Laterality: Left;   COLONOSCOPY WITH PROPOFOL  N/A 12/22/2019   Procedure: COLONOSCOPY WITH PROPOFOL ;  Surgeon: Marnee Sink, MD;  Location: [facility];  Service: Endoscopy;  Laterality: N/A;   INCISION AND DRAINAGE ABSCESS Left 08/11/2021   Procedure: INCISION AND DRAINAGE ABSCESS;  Surgeon: Lesly Raspberry, MD;  Location: [facility];  Service: ENT;  Laterality: Left;   LOWER EXTREMITY ANGIOGRAPHY Left 03/18/2020   Procedure: Lower Extremity Angiography;  Surgeon: Jackquelyn Mass, MD;  Location: [facility];  Service: Cardiovascular;  Laterality: Left;   LOWER EXTREMITY ANGIOGRAPHY Right 11/05/2022   Procedure: Lower Extremity Angiography;  Surgeon: Celso College, MD;  Location: [facility];  Service: Cardiovascular;  Laterality: Right;   TONSILLECTOMY     VIDEO ASSISTED THORACOSCOPY (VATS)/DECORTICATION Left 08/15/2020   Procedure: VIDEO ASSISTED THORACOSCOPY (VATS)/DECORTICATION;  Surgeon: Hilarie Lovely, MD;  Location: [facility];  Service: Thoracic;  Laterality: Left;   WISDOM TOOTH EXTRACTION      Current Medications: Current Facility-Administered Medications  Medication Dose Route Frequency Provider Last Rate Last Admin   acetaminophen  (TYLENOL ) tablet 650 mg  650 mg Oral Q6H PRN Gilman Lade, NP   650 mg at 10/15/23 1746   albuterol  (PROVENTIL )  (2.5 MG/3ML) 0.083% nebulizer solution 2.5 mg  2.5 mg Nebulization Q2H PRN Gilman Lade, NP       alum & mag hydroxide-simeth (MAALOX/MYLANTA) 200-200-20 MG/5ML suspension 30 mL  30 mL Oral Q4H PRN Lorrene Rosser, Veronique M, NP       apixaban  (ELIQUIS ) tablet 5 mg  5 mg Oral BID Korina Tretter, MD   5 mg at 10/15/23 2114   ARIPiprazole  (ABILIFY ) tablet 5 mg  5 mg Oral BID Shakila Mak, MD   5 mg at 10/15/23 2114   bisacodyl  (DULCOLAX) EC tablet 5 mg  5 mg Oral Daily PRN Byungura, Veronique M, NP       clopidogrel  (PLAVIX ) tablet 75 mg  75 mg Oral Daily Lovie Agresta, MD   75 mg at 10/15/23 4098   cyanocobalamin  (VITAMIN B12) tablet 1,000 mcg  1,000 mcg Oral  Daily Courtney Bellizzi, MD   1,000 mcg at 10/15/23 9147   gabapentin  (NEURONTIN ) capsule 400 mg  400 mg Oral TID Phillippa Straub, MD   400 mg at 10/15/23 2115   insulin  aspart (novoLOG ) injection 0-15 Units  0-15 Units Subcutaneous TID WC Onuoha, Chinwendu V, NP   5 Units at 10/15/23 1746   insulin  aspart (novoLOG ) injection 0-5 Units  0-5 Units Subcutaneous QHS Onuoha, Chinwendu V, NP   2 Units at 10/15/23 2115   insulin  aspart (novoLOG ) injection 3 Units  3 Units Subcutaneous TID WC [name] Sermersheim, MD   3 Units at 10/15/23 1746   insulin  glargine-yfgn (SEMGLEE ) injection 20 Units  20 Units Subcutaneous QHS Nashae Maudlin, MD   20 Units at 10/15/23 2115   loperamide (IMODIUM) capsule 2 mg  2 mg Oral PRN Dorthea Gauze, NP       magnesium  hydroxide (MILK OF MAGNESIA) suspension 30 mL  30 mL Oral Daily PRN Lorrene Rosser, Veronique M, NP       melatonin tablet 5 mg  5 mg Oral QHS PRN Takela Varden, MD   5 mg at 10/15/23 2114   nicotine  (NICODERM CQ  - dosed in mg/24 hours) patch 21 mg  21 mg Transdermal Daily Byungura, Veronique M, NP   21 mg at 10/15/23 8295   ondansetron  (ZOFRAN ) tablet 4 mg  4 mg Oral Q6H PRN Gilman Lade, NP       Or   ondansetron  (ZOFRAN ) injection 4 mg  4 mg Intravenous Q6H PRN Lorrene Rosser, Veronique M, NP        venlafaxine  XR (EFFEXOR -XR) 24 hr capsule 150 mg  150 mg Oral Q breakfast Jefry Lesinski, MD   150 mg at 10/15/23 6213    Lab Results:  Results for orders placed or performed during the hospital encounter of 10/08/23 (from the past 48 hours)  Glucose, capillary     Status: Abnormal   Collection Time: 10/14/23  7:37 AM  Result Value Ref Range   Glucose-Capillary 224 (H) 70 - 99 mg/dL    Comment: Glucose reference range applies only to samples taken after fasting for at least 8 hours.  Glucose, capillary     Status: Abnormal   Collection Time: 10/14/23 11:31 AM  Result Value Ref Range   Glucose-Capillary 397 (H) 70 - 99 mg/dL    Comment: Glucose reference range applies only to samples taken after fasting for at least 8 hours.  Glucose, capillary     Status: Abnormal   Collection Time: 10/14/23  4:41 PM  Result Value Ref Range   Glucose-Capillary 281 (H) 70 - 99 mg/dL    Comment: Glucose reference range applies only to samples taken after fasting for at least 8 hours.  Glucose, capillary     Status: Abnormal   Collection Time: 10/14/23  7:42 PM  Result Value Ref Range   Glucose-Capillary 170 (H) 70 - 99 mg/dL    Comment: Glucose reference range applies only to samples taken after fasting for at least 8 hours.  Glucose, capillary     Status: Abnormal   Collection Time: 10/15/23  7:21 AM  Result Value Ref Range   Glucose-Capillary 224 (H) 70 - 99 mg/dL    Comment: Glucose reference range applies only to samples taken after fasting for at least 8 hours.  Glucose, capillary     Status: Abnormal   Collection Time: 10/15/23 11:54 AM  Result Value Ref Range   Glucose-Capillary 241 (H) 70 - 99 mg/dL  Comment: Glucose reference range applies only to samples taken after fasting for at least 8 hours.  Glucose, capillary     Status: Abnormal   Collection Time: 10/15/23  4:08 PM  Result Value Ref Range   Glucose-Capillary 236 (H) 70 - 99 mg/dL    Comment: Glucose reference range applies  only to samples taken after fasting for at least 8 hours.  Glucose, capillary     Status: Abnormal   Collection Time: 10/15/23  7:26 PM  Result Value Ref Range   Glucose-Capillary 212 (H) 70 - 99 mg/dL    Comment: Glucose reference range applies only to samples taken after fasting for at least 8 hours.    Blood Alcohol level:  Lab Results  Component Value Date   ETH 182 (H) 10/07/2023   ETH <10 12/18/2020    Metabolic Disorder Labs: Lab Results  Component Value Date   HGBA1C 9.0 (H) 10/07/2023   MPG 211.6 10/07/2023   MPG 295 08/09/2021   No results found for: "PROLACTIN" Lab Results  Component Value Date   CHOL 245 (H) 10/07/2023   TRIG 425 (H) 10/07/2023   HDL 37 (L) 10/07/2023   CHOLHDL 6.6 10/07/2023   VLDL UNABLE TO CALCULATE IF TRIGLYCERIDE OVER 400 mg/dL 81/19/1478   LDLCALC UNABLE TO CALCULATE IF TRIGLYCERIDE OVER 400 mg/dL 29/56/2130   LDLCALC 70 06/22/2020    Physical Findings: AIMS:  , ,  ,  ,    CIWA:    COWS:      Psychiatric Specialty Exam:  Presentation  General Appearance:  Appropriate for Environment; Casual  Eye Contact: Fair  Speech: Clear and Coherent  Speech Volume: Normal    Mood and Affect  Mood: Depressed; Hopeless  Affect: Depressed; Flat   Thought Process  Thought Processes: Coherent  Descriptions of Associations:Intact  Orientation:Full (Time, Place and Person)  Thought Content:Logical  Hallucinations:denies  Ideas of Reference:None  Suicidal Thoughts:denies  Homicidal Thoughts:denies   Sensorium  Memory: Immediate Fair; Recent Fair; Remote Fair  Judgment: Impaired  Insight: Shallow   Executive Functions  Concentration: Fair  Attention Span: Fair  Recall: [employer] of Knowledge: Fair  Language: Fair   Psychomotor Activity  Psychomotor Activity: No data recorded  Musculoskeletal: Strength & Muscle Tone: within normal limits Gait & Station: normal Assets   Assets: Manufacturing systems engineer; Desire for Improvement; Resilience; Social Support    Physical Exam: Physical Exam Vitals and nursing note reviewed.  HENT:     Head: Normocephalic.  Cardiovascular:     Pulses: Normal pulses.  Pulmonary:     Effort: Pulmonary effort is normal.  Skin:    General: Skin is warm.  Neurological:     Mental Status: He is alert.    Review of Systems  Constitutional: Negative.   HENT: Negative.    Eyes: Negative.   Gastrointestinal: Negative.   Skin: Negative.   Neurological: Negative.    Blood pressure 116/73, pulse 81, temperature (!) 97.5 F (36.4 C), resp. rate 18, height 5\' 10"  (1.778 m), weight 81.6 kg, SpO2 99%. Body mass index is 25.83 kg/m.  Diagnosis: Principal Problem:   MDD (major depressive disorder), recurrent episode, severe (HCC)   Clinical Decision Making: Patient with history of depression presented to the emergency room with worsening depression and suicidal ideation in the context of multiple psychosocial stressors including not having access to medical care and housing.  Patient is an amputee, wheelchair based and has not been taking his antidepressant medications as he had no  access to care.  Patient is displaying impulsivity and mood lability and minimally engaged in the assessments.  Patient is hospitalization for further stabilization   Treatment Plan Summary:   Safety and Monitoring:             -- Voluntary admission to inpatient psychiatric unit for safety, stabilization and treatment             -- Daily contact with patient to assess and evaluate symptoms and progress in treatment             -- Patient's case to be discussed in multi-disciplinary team meeting             -- Observation Level: q15 minute checks             -- Vital signs:  q12 hours             -- Precautions: suicide, elopement, and assault   2. Psychiatric Diagnoses and Treatment:               Effexor  XR 150 mg daily  Abilify  5 mg  BID Patient reported this to medications working well before and gave consent to the medications.   -- The risks/benefits/side-effects/alternatives to this medication were discussed in detail with the patient and time was given for questions. The patient consents to medication trial.                -- Metabolic profile and EKG monitoring obtained while on an atypical antipsychotic (BMI: Lipid Panel: HbgA1c: QTc:)              -- Encouraged patient to participate in unit milieu and in scheduled group therapies                            3. Medical Issues Being Addressed:  Diabetic educator consult placed and then managing the insulin    4. Discharge Planning:              -- Social work and case management to assist with discharge planning and identification of hospital follow-up needs prior to discharge             -- Estimated LOS: 5-7 days             -- Discharge Concerns: Need to establish a safety plan; Medication compliance and effectiveness             -- Discharge Goals: Return home with outpatient referrals follow ups   Physician Treatment Plan for Primary Diagnosis: MDD (major depressive disorder), recurrent episode, severe (HCC) Long Term Goal(s): Improvement in symptoms so as ready for discharge   Short Term Goals: Ability to identify changes in lifestyle to reduce recurrence of condition will improve, Ability to verbalize feelings will improve, Ability to disclose and discuss suicidal ideas, Ability to demonstrate self-control will improve, and Ability to identify and develop effective coping behaviors will improve   Physician Treatment Plan for Secondary Diagnosis: Principal Problem:   MDD (major depressive disorder), recurrent episode, severe (HCC)   Long Term Goal(s): Improvement in symptoms so as ready for discharge   Short Term Goals: Ability to identify changes in lifestyle to reduce recurrence of condition will improve, Ability to verbalize feelings will improve,  Ability to disclose and discuss suicidal ideas, Ability to demonstrate self-control will improve, Ability to identify and develop effective coping behaviors will improve, Ability to maintain clinical measurements within normal limits will  improve, and Compliance with prescribed medications will improve    Harmon Bommarito, MD 10/15/2023, 9:49 PM

## 2023-10-15 NOTE — BHH Counselor (Signed)
 CSW contacted [employer] Billing Dept [phone]) to get information from billing department about pt's bill in order to get pt a wheelchair.   They report they need to pick up pt's oxygen concentrator and that they needs to schedule with the pt in order to clear the balance on the account.   CSW will inform pt and call back with pt to get the account cleared.    Derrill Flirt, MSW, [place] 10/15/2023 1:08 PM

## 2023-10-15 NOTE — Progress Notes (Signed)
   10/15/23 0900  Psych Admission Type (Psych Patients Only)  Admission Status Voluntary  Psychosocial Assessment  Patient Complaints None  Eye Contact Fair  Facial Expression Flat  Affect Appropriate to circumstance  Speech Logical/coherent  Interaction Assertive  Motor Activity Slow  Appearance/Hygiene Unremarkable  Behavior Characteristics Cooperative  Mood Pleasant  Thought Process  Coherency WDL  Content WDL  Delusions None reported or observed  Perception WDL  Hallucination None reported or observed  Judgment WDL  Confusion None  Danger to Self  Current suicidal ideation? Denies  Danger to Others  Danger to Others None reported or observed

## 2023-10-15 NOTE — Inpatient Diabetes Management (Signed)
 Inpatient Diabetes Program Recommendations  AACE/ADA: New Consensus Statement on Inpatient Glycemic Control (2015)  Target Ranges:  Prepandial:   less than 140 mg/dL      Peak postprandial:   less than 180 mg/dL (1-2 hours)      Critically ill patients:  140 - 180 mg/dL   Lab Results  Component Value Date   GLUCAP 224 (H) 10/15/2023   HGBA1C 9.0 (H) 10/07/2023    Review of Glycemic Control  Latest Reference Range & Units 10/13/23 16:28 10/13/23 19:47 10/14/23 07:37 10/14/23 11:31 10/14/23 16:41 10/14/23 19:42 10/15/23 07:21  Glucose-Capillary 70 - 99 mg/dL 782 (H) 956 (H) 213 (H) 397 (H) 281 (H) 170 (H) 224 (H)  (H): Data is abnormally high Diabetes history: DM2 Outpatient Diabetes medications: Lantus  25 units at bedtime (not taking), Metformin  1000 mg BID (not taking) Current orders for Inpatient glycemic control: Semglee  20 units QHS, Novolog  0-15 units TID with meals, Novolog  0-5 units at bedtime, Novolog  3 units TID   Inpatient Diabetes Program Recommendations  Consider increasing Semglee  to 25 units at bedtime and increasing Novolog  6 units TID (assuming patient is consuming 50% of meals).   Thanks, Marjo Sievert, MSN, RNC-OB Diabetes Coordinator [phone] (8a-5p)

## 2023-10-15 NOTE — Plan of Care (Signed)
 Problem: Activity: Goal: Will identify at least one activity in which they can participate Outcome: Progressing   Problem: Coping: Goal: Ability to identify and develop effective coping behavior will improve Outcome: Progressing Goal: Ability to interact with others will improve Outcome: Progressing Goal: Demonstration of participation in decision-making regarding own care will improve Outcome: Progressing Goal: Ability to use eye contact when communicating with others will improve Outcome: Progressing   Problem: Health Behavior/Discharge Planning: Goal: Identification of resources available to assist in meeting health care needs will improve Outcome: Progressing   Problem: Self-Concept: Goal: Will verbalize positive feelings about self Outcome: Progressing   Problem: Education: Goal: Ability to make informed decisions regarding treatment will improve Outcome: Progressing   Problem: Coping: Goal: Coping ability will improve Outcome: Progressing   Problem: Health Behavior/Discharge Planning: Goal: Identification of resources available to assist in meeting health care needs will improve Outcome: Progressing   Problem: Medication: Goal: Compliance with prescribed medication regimen will improve Outcome: Progressing   Problem: Self-Concept: Goal: Ability to disclose and discuss suicidal ideas will improve Outcome: Progressing Goal: Will verbalize positive feelings about self Outcome: Progressing Note: Patient is on track. Patient will maintain adherence    Problem: Education: Goal: Knowledge of  General Education information/materials will improve Outcome: Progressing Goal: Emotional status will improve Outcome: Progressing Goal: Mental status will improve Outcome: Progressing Goal: Verbalization of understanding the information provided will improve Outcome: Progressing   Problem: Activity: Goal: Interest or engagement in activities will  improve Outcome: Progressing Goal: Sleeping patterns will improve Outcome: Progressing   Problem: Coping: Goal: Ability to verbalize frustrations and anger appropriately will improve Outcome: Progressing Goal: Ability to demonstrate self-control will improve Outcome: Progressing   Problem: Health Behavior/Discharge Planning: Goal: Identification of resources available to assist in meeting health care needs will improve Outcome: Progressing Goal: Compliance with treatment plan for underlying cause of condition will improve Outcome: Progressing   Problem: Physical Regulation: Goal: Ability to maintain clinical measurements within normal limits will improve Outcome: Progressing   Problem: Safety: Goal: Periods of time without injury will increase Outcome: Progressing   Problem: Education: Goal: Utilization of techniques to improve thought processes will improve Outcome: Progressing Goal: Knowledge of the prescribed therapeutic regimen will improve Outcome: Progressing   Problem: Activity: Goal: Interest or engagement in leisure activities will improve Outcome: Progressing Goal: Imbalance in normal sleep/wake cycle will improve Outcome: Progressing   Problem: Coping: Goal: Coping ability will improve Outcome: Progressing Goal: Will verbalize feelings Outcome: Progressing   Problem: Health Behavior/Discharge Planning: Goal: Ability to make decisions will improve Outcome: Progressing Goal: Compliance with therapeutic regimen will improve Outcome: Progressing   Problem: Role Relationship: Goal: Will demonstrate positive changes in social behaviors and relationships Outcome: Progressing   Problem: Safety: Goal: Ability to disclose and discuss suicidal ideas will improve Outcome: Progressing Goal: Ability to identify and utilize support systems that promote safety will improve Outcome: Progressing   Problem: Self-Concept: Goal: Will verbalize positive feelings about  self Outcome: Progressing Goal: Level of anxiety will decrease Outcome: Progressing   Problem: Education: Goal: Ability to state activities that reduce stress will improve Outcome: Progressing   Problem: Coping: Goal: Ability to identify and develop effective coping behavior will improve Outcome: Progressing   Problem: Self-Concept: Goal: Ability to identify factors that promote anxiety will improve Outcome: Progressing Goal: Level of anxiety will decrease Outcome: Progressing Goal: Ability to modify response to factors that promote anxiety will improve Outcome: Progressing   Problem: Education: Goal: Ability to  describe self-care measures that may prevent or decrease complications (Diabetes Survival Skills Education) will improve Outcome: Progressing Goal: Individualized Educational Video(s) Outcome: Progressing   Problem: Coping: Goal: Ability to adjust to condition or change in health will improve Outcome: Progressing   Problem: Fluid Volume: Goal: Ability to maintain a balanced intake and output will improve Outcome: Progressing

## 2023-10-15 NOTE — Progress Notes (Signed)
 PT Cancellation and Discharge  Patient Details Name: Timothy [name]. MRN: [id number] DOB: 1960/07/25   Cancelled Treatment:    Reason Eval/Treat Not Completed: Other (comment);Patient declined, no reason specified (He politely reports he is at baseline for functional mobility, not interested in gait training or therapeutic exercise. He anticipates discharge tomorrow. All further PT need can be met as outpatient if patient interested.)  Ozie Bo, PT, MPT  [name] 10/15/2023, 10:44 AM

## 2023-10-15 NOTE — Plan of Care (Signed)
   Problem: Activity: Goal: Will identify at least one activity in which they can participate Outcome: Progressing   Problem: Coping: Goal: Ability to identify and develop effective coping behavior will improve Outcome: Progressing Goal: Ability to interact with others will improve Outcome: Progressing Goal: Demonstration of participation in decision-making regarding own care will improve Outcome: Progressing Goal: Ability to use eye contact when communicating with others will improve Outcome: Progressing

## 2023-10-15 NOTE — [facility] Suicide Risk Assessment (Signed)
 [facility] Discharge Suicide Risk Assessment   Principal Problem: MDD (major depressive disorder), recurrent episode, severe (HCC) Discharge Diagnoses: Principal Problem:   MDD (major depressive disorder), recurrent episode, severe (HCC)   Total Time spent with patient: 30 minutes  Musculoskeletal: Strength & Muscle Tone: within normal limits Gait & Station: ataxic Patient leans: N/A  Psychiatric Specialty Exam  Presentation  General Appearance:  Appropriate for Environment; Casual  Eye Contact: Fair  Speech: Clear and Coherent  Speech Volume: Normal  Handedness: Right   Mood and Affect  Mood: Depressed; Hopeless  Duration of Depression Symptoms: No data recorded Affect: Depressed; Flat   Thought Process  Thought Processes: Coherent  Descriptions of Associations:Intact  Orientation:Full (Time, Place and Person)  Thought Content:Logical  History of Schizophrenia/Schizoaffective disorder:No data recorded Duration of Psychotic Symptoms:No data recorded Hallucinations:No data recorded Ideas of Reference:None  Suicidal Thoughts:No data recorded Homicidal Thoughts:No data recorded  Sensorium  Memory: Immediate Fair; Recent Fair; Remote Fair  Judgment: Impaired  Insight: Shallow   Executive Functions  Concentration: Fair  Attention Span: Fair  Recall: [employer] of Knowledge: Fair  Language: Fair   Psychomotor Activity  Psychomotor Activity:No data recorded  Assets  Assets: Communication Skills; Desire for Improvement; Resilience; Social Support   Sleep  Sleep:No data recorded  Physical Exam: Physical Exam ROS Blood pressure 116/73, pulse 81, temperature (!) 97.5 F (36.4 C), resp. rate 18, height 5\' 10"  (1.778 m), weight 81.6 kg, SpO2 99%. Body mass index is 25.83 kg/m.  Mental Status Per Nursing Assessment::   On Admission:  Suicidal ideation indicated by patient, Suicide plan  Demographic Factors:  Male and  Caucasian  Loss Factors: Decrease in vocational status  Historical Factors: Impulsivity  Risk Reduction Factors:   Positive social support, Positive therapeutic relationship, and Positive coping skills or problem solving skills  Continued Clinical Symptoms:  Bipolar Disorder:   Depressive phase  Cognitive Features That Contribute To Risk:  None    Suicide Risk:  Minimal: No identifiable suicidal ideation.  Patients presenting with no risk factors but with morbid ruminations; may be classified as minimal risk based on the severity of the depressive symptoms   Follow-up Information     [facility] Follow up.   Why: Please Contact information: [address] [place] [address] [phone]                 Plan Of Care/Follow-up recommendations:  Activity:  As tolerated  Aurelia Blotter, MD 10/15/2023, 9:57 PM

## 2023-10-15 NOTE — Progress Notes (Signed)
   10/15/23 0624  15 Minute Checks  Location Bedroom  Visual Appearance Calm  Behavior Sleeping  Sleep (Behavioral Health Patients Only)  Calculate sleep? (Click Yes once per 24 hr at 0600 safety check) Yes  Documented sleep last 24 hours 7

## 2023-10-15 NOTE — BHH Counselor (Signed)
 CSW contacted [employer] [phone]) per pt's request to get a phone.   Representative at [employer], reports that pt needs to have an email to be able to request a phone.   CSW informed pt of this and informed pt if he does not have internet access that he can use his Medicaid transportation to access the internet at the [employer].   Derrill Flirt, MSW, [place] 10/15/2023 2:33 PM

## 2023-10-15 NOTE — Progress Notes (Signed)
 Pt c/o loose stools mostly at night and has led to some soiling accidents. 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 Provider notified. New orders placed.

## 2023-10-15 NOTE — Group Note (Signed)
 Recreation Therapy Group Note   Group Topic:General Recreation  Group Date: 10/15/2023 Start Time: 1500 End Time: 1600 Facilitators: Deatrice Factor, LRT, CTRS Location: Courtyard  Group Description: Outdoor Recreation. Patients had the option to play corn hole, ring toss, bowling or listening to music while outside in the courtyard getting fresh air and sunlight. LRT and patients discussed things that they enjoy doing in their free time outside of the hospital. LRT encouraged patients to drink water after being active and getting their heart rate up.   Goal Area(s) Addressed: Patient will identify leisure interests.  Patient will practice healthy decision making. Patient will engage in recreation activity.   Affect/Mood: Appropriate   Participation Level: Active   Participation Quality: Independent   Behavior: Appropriate   Speech/Thought Process: Coherent   Insight: Good   Judgement: Good   Modes of Intervention: Music, Rapport Building, and Socialization   Patient Response to Interventions:  Receptive   Education Outcome:  Acknowledges education   Clinical Observations/Individualized Feedback: Schawn was active in their participation of session activities and group discussion. Pt interacted well with LRT and peers duration of session.    Plan: Continue to engage patient in RT group sessions 2-3x/week.   Deatrice Factor, LRT, CTRS 10/15/2023 4:43 PM

## 2023-10-16 LAB — GLUCOSE, CAPILLARY: Glucose-Capillary: 216 mg/dL — ABNORMAL HIGH (ref 70–99)

## 2023-10-16 MED ORDER — MELATONIN 3 MG PO TABS: 5.0000 mg | ORAL_TABLET | Freq: Every evening | ORAL | 0 refills | Status: DC | PRN

## 2023-10-16 MED ORDER — CLOPIDOGREL BISULFATE 75 MG PO TABS: 75.0000 mg | ORAL_TABLET | Freq: Every day | ORAL | 0 refills | Status: DC

## 2023-10-16 MED ORDER — INSULIN ASPART 100 UNIT/ML IJ SOLN: 3.0000 [IU] | Freq: Three times a day (TID) | INTRAMUSCULAR | 11 refills | Status: DC

## 2023-10-16 MED ORDER — INSULIN ASPART 100 UNIT/ML IJ SOLN
0.0000 [IU] | Freq: Three times a day (TID) | INTRAMUSCULAR | 11 refills | Status: AC
Start: 2023-10-16 — End: ?

## 2023-10-16 MED ORDER — GABAPENTIN 400 MG PO CAPS: 400.0000 mg | ORAL_CAPSULE | Freq: Three times a day (TID) | ORAL | 0 refills | Status: DC

## 2023-10-16 MED ORDER — ALBUTEROL SULFATE (2.5 MG/3ML) 0.083% IN NEBU: 2.5000 mg | INHALATION_SOLUTION | RESPIRATORY_TRACT | 12 refills | Status: DC | PRN

## 2023-10-16 MED ORDER — NICOTINE 21 MG/24HR TD PT24: 21.0000 mg | MEDICATED_PATCH | Freq: Every day | TRANSDERMAL | 0 refills | Status: DC

## 2023-10-16 MED ORDER — VENLAFAXINE HCL ER 150 MG PO CP24
150.0000 mg | ORAL_CAPSULE | Freq: Every day | ORAL | 0 refills | Status: DC
Start: 2023-10-16 — End: 2024-04-22

## 2023-10-16 MED ORDER — INSULIN ASPART 100 UNIT/ML IJ SOLN: 0.0000 [IU] | Freq: Every day | INTRAMUSCULAR | 11 refills | Status: DC

## 2023-10-16 MED ORDER — INSULIN GLARGINE-YFGN 100 UNIT/ML ~~LOC~~ SOLN: 20.0000 [IU] | Freq: Every day | SUBCUTANEOUS | 11 refills | Status: AC

## 2023-10-16 MED ORDER — ARIPIPRAZOLE 5 MG PO TABS: 5.0000 mg | ORAL_TABLET | Freq: Two times a day (BID) | ORAL | 0 refills | Status: DC

## 2023-10-16 NOTE — Progress Notes (Signed)
   10/15/23 2000  Psych Admission Type (Psych Patients Only)  Admission Status Voluntary  Psychosocial Assessment  Patient Complaints Anxiety;Worrying  Eye Contact Fair  Facial Expression Anxious  Affect Anxious;Appropriate to circumstance  Speech Logical/coherent  Interaction Assertive  Motor Activity Slow (amputee; uses wheelchair)  Appearance/Hygiene Unremarkable  Behavior Characteristics Cooperative;Anxious  Mood Anxious;Pleasant  Thought Process  Coherency WDL  Content WDL  Delusions None reported or observed  Perception WDL  Hallucination None reported or observed  Judgment WDL  Confusion None  Danger to Self  Current suicidal ideation? Denies  Agreement Not to Harm Self Yes  Description of Agreement verbal  Danger to Others  Danger to Others None reported or observed   Progress note   D: Pt seen in dayroom watching television. Pt denies SI, HI, AVH. Pt rates pain  4.5/10. Pt rates anxiety  6/10 and depression  0/10. Pt is surprised by his anxiety. "I'm discharging tomorrow." Suggested that this could be a reason for anxiety. Pt also states that he received a new wheelchair today and that it is heavier than his last one. "I won't be able to lift it out of the car myself." Another pt gave him information about where he received his wheelchair from. Pt also c/o some loose stools. No other concerns noted at this time.  A: Pt provided support and encouragement. Pt given scheduled medication as prescribed. PRNs as appropriate. Q15 min checks for safety.   R: Pt safe on the unit. Will continue to monitor.

## 2023-10-16 NOTE — Progress Notes (Signed)
 Patient ID: Timothy Kraly., male   DOB: 21-Aug-1960, 63 y.o.   MRN: [id number]  Patient was discharged from [facility] unit at approx 1105 escorted by staff. Patient denies SI/HI/AVH. Discharge packet to include printed AVS, Suicide Risk Assessment, and Transition Record reviewed with patient. Belongings returned. Suicide safety plan completed with a copy kept in chart.

## 2023-10-16 NOTE — Group Note (Signed)
 Date:  10/16/2023 Time:  10:11 AM  Group Topic/Focus:  Self Care:   The focus of this group is to help patients understand the importance of self-care in order to improve or restore emotional, physical, spiritual, interpersonal, and financial health.    Participation Level:  Did Not Attend   Additional Comments:  Did not attend  [name] Arias 10/16/2023, 10:11 AM

## 2023-10-16 NOTE — Discharge Summary (Signed)
 Physician Discharge Summary Note  Patient:  Timothy [name]. is an 63 y.o., male MRN:  [id number] DOB:  [birth year] Patient phone:  [phone] (home)  Patient address:   [address] [place] [address],    Date of Admission:  10/08/2023 Date of Discharge: 10/16/23  Reason for Admission:  Timothy [name], Timothy [name]. Is a 63 year old male with a history of PTSD, major depressive disorder, bipolar disorder, anxiety, diabetes, hypertension, hyperlipidemia, right BKA, COPD who presents to the ED with suicidal ideation. Ethanol 182 at 0041 on 4/21. Patient is admitted to [facility] unit with Q15 min safety monitoring. Multidisciplinary team approach is offered. Medication management; group/milieu therapy is offered.   Principal Problem: MDD (major depressive disorder), recurrent episode, severe (HCC) Discharge Diagnoses: Principal Problem:   MDD (major depressive disorder), recurrent episode, severe (HCC)   Past Psychiatric History: see h&p  Family Psychiatric  History: see h&p Social History:  Social History   Substance and Sexual Activity  Alcohol Use Yes   Alcohol/week: 6.0 standard drinks of alcohol   Types: 6 Cans of beer per week   Comment: 1-2 times weekly; 4 "40s"     Social History   Substance and Sexual Activity  Drug Use Not Currently    Social History   Socioeconomic History   Marital status: Single    Spouse name: Not on file   Number of children: 0   Years of education: Not on file   Highest education level: Not on file  Occupational History   Occupation: Unemployed  Tobacco Use   Smoking status: Every Day    Current packs/day: 1.00    Average packs/day: 1 pack/day for 41.0 years (41.0 ttl pk-yrs)    Types: Cigarettes   Smokeless tobacco: Never   Tobacco comments:    1 pack a day reported but is trying to cut back to stop  Vaping Use   Vaping status: Never Used  Substance and Sexual Activity   Alcohol use: Yes    Alcohol/week: 6.0 standard  drinks of alcohol    Types: 6 Cans of beer per week    Comment: 1-2 times weekly; 4 "40s"   Drug use: Not Currently   Sexual activity: Not Currently  Other Topics Concern   Not on file  Social History Narrative   Lives with 1 roommate   Social Drivers of Health   Financial Resource Strain: Medium Risk (05/28/2022)   Overall Financial Resource Strain (CARDIA)    Difficulty of Paying Living Expenses: Somewhat hard  Food Insecurity: No Food Insecurity (10/08/2023)   Hunger Vital Sign    Worried About Running Out of Food in the Last Year: Never true    Ran Out of Food in the Last Year: Never true  Transportation Needs: No Transportation Needs (10/08/2023)   PRAPARE - Administrator, Civil Service (Medical): No    Lack of Transportation (Non-Medical): No  Physical Activity: Inactive (05/28/2022)   Exercise Vital Sign    Days of Exercise per Week: 0 days    Minutes of Exercise per Session: 0 min  Stress: Stress Concern Present (05/28/2022)   [employer] of Occupational Health - Occupational Stress Questionnaire    Feeling of Stress : Very much  Social Connections: Moderately Isolated (10/07/2023)   Social Connection and Isolation Panel [NHANES]    Frequency of Communication with Friends and Family: Twice a week    Frequency of Social Gatherings with Friends and Family: Once a  week    Attends Religious Services: Never    Active Member of Clubs or Organizations: No    Attends Engineer, structural: Never    Marital Status: Living with partner   Past Medical History:  Past Medical History:  Diagnosis Date   Anxiety    CHF (congestive heart failure) (HCC)    COPD (chronic obstructive pulmonary disease) (HCC)    Depression    Diabetes mellitus without complication (HCC)    GERD (gastroesophageal reflux disease)    Hypercholesteremia    Hypertension    Pneumonia    PTSD (post-traumatic stress disorder)     Past Surgical History:  Procedure Laterality  Date   AMPUTATION Left 03/22/2020   Procedure: AMPUTATION RAY-1st Ray Left Foot;  Surgeon: Angel Barba, [name];  Location: [facility];  Service: Podiatry;  Laterality: Left;   AMPUTATION Right 11/07/2022   Procedure: AMPUTATION BELOW KNEE;  Surgeon: Celso College, MD;  Location: [facility];  Service: Vascular;  Laterality: Right;   AMPUTATION TOE Left 03/16/2020   Procedure: AMPUTATION GREAT TOE;  Surgeon: Angel Barba, [name];  Location: [facility];  Service: Podiatry;  Laterality: Left;   AMPUTATION TOE Left 04/06/2022   Procedure: AMPUTATION TOE;  Surgeon: Angel Barba, [name];  Location: [facility];  Service: Podiatry;  Laterality: Left;   COLONOSCOPY WITH PROPOFOL  N/A 12/22/2019   Procedure: COLONOSCOPY WITH PROPOFOL ;  Surgeon: Marnee Sink, MD;  Location: [facility];  Service: Endoscopy;  Laterality: N/A;   INCISION AND DRAINAGE ABSCESS Left 08/11/2021   Procedure: INCISION AND DRAINAGE ABSCESS;  Surgeon: Lesly Raspberry, MD;  Location: [facility];  Service: ENT;  Laterality: Left;   LOWER EXTREMITY ANGIOGRAPHY Left 03/18/2020   Procedure: Lower Extremity Angiography;  Surgeon: Jackquelyn Mass, MD;  Location: [facility];  Service: Cardiovascular;  Laterality: Left;   LOWER EXTREMITY ANGIOGRAPHY Right 11/05/2022   Procedure: Lower Extremity Angiography;  Surgeon: Celso College, MD;  Location: [facility];  Service: Cardiovascular;  Laterality: Right;   TONSILLECTOMY     VIDEO ASSISTED THORACOSCOPY (VATS)/DECORTICATION Left 08/15/2020   Procedure: VIDEO ASSISTED THORACOSCOPY (VATS)/DECORTICATION;  Surgeon: Hilarie Lovely, MD;  Location: [facility];  Service: Thoracic;  Laterality: Left;   WISDOM TOOTH EXTRACTION     Family History:  Family History  Family history unknown: Yes    Hospital Course:  Timothy [name], Timothy [name]. Is a 63 year old male with a history of PTSD, major depressive disorder, bipolar disorder, anxiety, diabetes, hypertension, hyperlipidemia, right BKA, COPD who presents to the  ED with suicidal ideation. Ethanol 182 at 0041 on 4/21. Patient is admitted to [facility] unit with Q15 min safety monitoring. Multidisciplinary team approach is offered. Medication management; group/milieu therapy is offered.  On admission patient's Effexor  was adjusted to 150 mg daily to help with the depression and anxiety.  Abilify  5 mg daily was added as a mood stabilizer.  Patient reported good response to Abilify  and Effexor  in the past.  During the admission patient displayed safe behaviors.  He participated in groups and had no behavioral problems on the unit.  He has chronic visual hallucinations since the stroke in 2019 but denies auditory hallucinations.  Throughout the stay patient denied SI/HI.  On the day of discharge patient reports improvement in his mood, denied SI/HI/plan, denied hallucinations of auditory type.  He is taking his medications with no reported side effects.  He remains future oriented and is willing to participate in outpatient mental health services.  Physical Findings: AIMS:  , ,  ,  ,    CIWA:    COWS:        Psychiatric Specialty Exam:  Presentation  General Appearance:  Casual  Eye Contact: Fair  Speech: Clear and Coherent  Speech Volume: Normal    Mood and Affect  Mood: Euthymic  Affect: Appropriate   Thought Process  Thought Processes: Coherent  Descriptions of Associations:Intact  Orientation:Full (Time, Place and Person)  Thought Content:Logical  Hallucinations:Hallucinations: None  Ideas of Reference:None  Suicidal Thoughts:Suicidal Thoughts: No  Homicidal Thoughts:Homicidal Thoughts: No   Sensorium  Memory: Immediate Fair; Recent Fair; Remote Fair  Judgment: Fair  Insight: Fair   Art therapist  Concentration: Fair  Attention Span: Fair  Recall: [employer] of Knowledge: Fair  Language: Fair   Psychomotor Activity  Psychomotor Activity: Psychomotor Activity:  Normal  Musculoskeletal: Strength & Muscle Tone: within normal limits Gait & Station: unsteady Assets  Assets: Manufacturing systems engineer; Desire for Improvement; Resilience; Social Support   Sleep  Sleep: Sleep: Fair    Physical Exam: Physical Exam Vitals and nursing note reviewed.    ROS Blood pressure 137/86, pulse 76, temperature (!) 97.3 F (36.3 C), resp. rate 18, height 5\' 10"  (1.778 m), weight 81.6 kg, SpO2 99%. Body mass index is 25.83 kg/m.   Social History   Tobacco Use  Smoking Status Every Day   Current packs/day: 1.00   Average packs/day: 1 pack/day for 41.0 years (41.0 ttl pk-yrs)   Types: Cigarettes  Smokeless Tobacco Never  Tobacco Comments   1 pack a day reported but is trying to cut back to stop   Tobacco Cessation:  N/A, patient does not currently use tobacco products   Blood Alcohol level:  Lab Results  Component Value Date   ETH 182 (H) 10/07/2023   ETH <10 12/18/2020    Metabolic Disorder Labs:  Lab Results  Component Value Date   HGBA1C 9.0 (H) 10/07/2023   MPG 211.6 10/07/2023   MPG 295 08/09/2021   No results found for: "PROLACTIN" Lab Results  Component Value Date   CHOL 245 (H) 10/07/2023   TRIG 425 (H) 10/07/2023   HDL 37 (L) 10/07/2023   CHOLHDL 6.6 10/07/2023   VLDL UNABLE TO CALCULATE IF TRIGLYCERIDE OVER 400 mg/dL 02/72/5366   LDLCALC UNABLE TO CALCULATE IF TRIGLYCERIDE OVER 400 mg/dL 44/08/4740   LDLCALC 70 06/22/2020    See Psychiatric Specialty Exam and Suicide Risk Assessment completed by Attending Physician prior to discharge.  Discharge destination:  Home  Is patient on multiple antipsychotic therapies at discharge:  No   Has Patient had three or more failed trials of antipsychotic monotherapy by history:  No  Recommended Plan for Multiple Antipsychotic Therapies: NA   Allergies as of 10/16/2023       Reactions   Bee Venom Anaphylaxis   Cheese Anaphylaxis, Swelling, Other (See Comments)   Blue Cheese  only   Coffea Arabica Shortness Of Breath   Coffee Flavoring Agent (non-screening) Shortness Of Breath        Medication List     STOP taking these medications    acetaminophen  650 MG CR tablet Commonly known as: TYLENOL    cloNIDine  0.1 MG tablet Commonly known as: CATAPRES    gabapentin  800 MG tablet Commonly known as: NEURONTIN  Replaced by: gabapentin  400 MG capsule   insulin  glargine 100 UNIT/ML Solostar Pen Commonly known as: LANTUS    sulfamethoxazole -trimethoprim  800-160 MG tablet Commonly known as: BACTRIM  DS  TAKE these medications      Indication  albuterol  (2.5 MG/3ML) 0.083% nebulizer solution Commonly known as: PROVENTIL  Take 3 mLs (2.5 mg total) by nebulization every 2 (two) hours as needed for wheezing.    ARIPiprazole  5 MG tablet Commonly known as: ABILIFY  Take 1 tablet (5 mg total) by mouth 2 (two) times daily.    aspirin  EC 81 MG tablet Take 1 tablet (81 mg total) by mouth daily. Swallow whole.    blood glucose meter kit and supplies Kit Dispense based on patient and insurance preference. Use up to four times daily as directed. (FOR ICD-9 250.00, 250.01).    clopidogrel  75 MG tablet Commonly known as: PLAVIX  Take 1 tablet (75 mg total) by mouth daily.    cyanocobalamin  1000 MCG tablet Commonly known as: VITAMIN B12 Take 1 tablet (1,000 mcg total) by mouth daily.    Eliquis  5 MG Tabs tablet Generic drug: apixaban  Take 5 mg by mouth 2 (two) times daily.    gabapentin  400 MG capsule Commonly known as: NEURONTIN  Take 1 capsule (400 mg total) by mouth 3 (three) times daily. Replaces: gabapentin  800 MG tablet    insulin  aspart 100 UNIT/ML injection Commonly known as: novoLOG  Inject 0-15 Units into the skin 3 (three) times daily with meals.    insulin  aspart 100 UNIT/ML injection Commonly known as: novoLOG  Inject 0-5 Units into the skin at bedtime.    insulin  aspart 100 UNIT/ML injection Commonly known as: novoLOG  Inject 3 Units  into the skin 3 (three) times daily with meals.    insulin  glargine-yfgn 100 UNIT/ML injection Commonly known as: SEMGLEE  Inject 0.2 mLs (20 Units total) into the skin at bedtime.  Indication: High Blood Sugar, Type 2 Diabetes   melatonin 3 MG Tabs tablet Take 1.5 tablets (4.5 mg total) by mouth at bedtime as needed.    metFORMIN  1000 MG tablet Commonly known as: GLUCOPHAGE  TAKE ONE TABLET (1,000MG  TOTAL) BY MOUTH TWICE DAILY.    metoprolol  succinate 50 MG 24 hr tablet Commonly known as: TOPROL -XL Take 50 mg by mouth daily.    mometasone -formoterol  100-5 MCG/ACT Aero Commonly known as: DULERA  Inhale 2 puffs into the lungs 2 (two) times daily.    nicotine  21 mg/24hr patch Commonly known as: NICODERM CQ  - dosed in mg/24 hours Place 1 patch (21 mg total) onto the skin daily.    pantoprazole  40 MG tablet Commonly known as: PROTONIX  TAKE ONE TABLET (40MG  TOTAL) BY MOUTH ONCE DAILY. What changed:  how much to take when to take this    Pen Needles 32G X 6 MM Misc 1 each by Does not apply route daily.    rosuvastatin  5 MG tablet Commonly known as: Crestor  TAKE ONE TABLET (5MG  TOTAL) BY MOUTH ONCE DAILY.    venlafaxine  XR 150 MG 24 hr capsule Commonly known as: EFFEXOR -XR Take 1 capsule (150 mg total) by mouth daily with breakfast. What changed:  additional instructions Another medication with the same name was removed. Continue taking this medication, and follow the directions you see here.                Durable Medical Equipment  (From admission, onward)           Start     Ordered   10/10/23 1042  For home use only DME wheelchair cushion (seat and back)  Once        10/10/23 1041            Follow-up Information  [facility] Follow up.   Why: Your primary care physician has agreed to assist you with home health wound care. Please call them following discharge to schedule your first dressing change. Contact information: [address] [place] [address] [phone]         [facility] Follow up.   Why: A referral has been placed for [facility]. Speaking with the front desk staff they would like to review your information before scheduling. Please feel free to give them a call if you have questions. I have also included the new patient paperwork should you need it.                Follow-up recommendations:  Activity:  As tolerated    Signed: Dariane Natzke, MD 10/16/2023, 10:19 AM

## 2023-10-16 NOTE — Progress Notes (Signed)
  [facility] Adult Case Management Discharge Plan :  Will you be returning to the same living situation after discharge:  Yes,  pt will return home  At discharge, do you have transportation home?: Yes,  pt's friend will pick him up  Do you have the ability to pay for your medications: Yes,  [employer] TAILORED PLAN / TRILLIUM TAILORED PLAN  Release of information consent forms completed and in the chart;  Patient's signature needed at discharge.  Patient to Follow up at:  Follow-up Information     [facility] Follow up.   Why: Your primary care physician has agreed to assist you with home health wound care. Please call them following discharge to schedule your first dressing change. Contact information: [address] [place] [address] [phone]         [facility] Follow up.   Why: A referral has been placed for [facility]. Speaking with the front desk staff they would like to review your information before scheduling. Please feel free to give them a call if you have questions. I have also included the new patient paperwork should you need it.                Next level of care provider has access to [facility] Link:no  Safety Planning and Suicide Prevention discussed: Yes,  Edythe Grand, friend, [phone]     Has patient been referred to the Quitline?: Patient refused referral for treatment  Patient has been referred for addiction treatment: No known substance use disorder.  [address], LCSWA 10/16/2023, 10:19 AM

## 2023-10-16 NOTE — Plan of Care (Signed)
  Problem: Activity: Goal: Will identify at least one activity in which they can participate Outcome: Progressing   Problem: Coping: Goal: Ability to identify and develop effective coping behavior will improve Outcome: Progressing Goal: Ability to interact with others will improve Outcome: Progressing Goal: Demonstration of participation in decision-making regarding own care will improve Outcome: Progressing Goal: Ability to use eye contact when communicating with others will improve Outcome: Progressing   Problem: Medication: Goal: Compliance with prescribed medication regimen will improve Outcome: Progressing   Problem: Self-Concept: Goal: Ability to disclose and discuss suicidal ideas will improve Outcome: Progressing   Problem: Education: Goal: Emotional status will improve Outcome: Progressing Goal: Mental status will improve Outcome: Progressing

## 2023-11-01 ENCOUNTER — Emergency Department
Admission: EM | Admit: 2023-11-01 | Discharge: 2023-11-01 | Disposition: A | Discharge status: Home / Self Care | Payer: MEDICAID | Attending: Emergency Medicine | Admitting: Emergency Medicine

## 2023-11-01 ENCOUNTER — Other Ambulatory Visit: Payer: Self-pay

## 2023-11-01 ENCOUNTER — Emergency Department: Payer: MEDICAID

## 2023-11-01 DIAGNOSIS — T148XXA Other injury of unspecified body region, initial encounter: Secondary | ICD-10-CM

## 2023-11-01 DIAGNOSIS — W01198A Fall on same level from slipping, tripping and stumbling with subsequent striking against other object, initial encounter: Secondary | ICD-10-CM | POA: Insufficient documentation

## 2023-11-01 DIAGNOSIS — M25551 Pain in right hip: Secondary | ICD-10-CM | POA: Diagnosis not present

## 2023-11-01 DIAGNOSIS — S3992XA Unspecified injury of lower back, initial encounter: Secondary | ICD-10-CM | POA: Diagnosis present

## 2023-11-01 DIAGNOSIS — W19XXXA Unspecified fall, initial encounter: Secondary | ICD-10-CM

## 2023-11-01 DIAGNOSIS — S300XXA Contusion of lower back and pelvis, initial encounter: Secondary | ICD-10-CM | POA: Diagnosis not present

## 2023-11-01 DIAGNOSIS — Z8673 Personal history of transient ischemic attack (TIA), and cerebral infarction without residual deficits: Secondary | ICD-10-CM | POA: Diagnosis not present

## 2023-11-01 DIAGNOSIS — Z7901 Long term (current) use of anticoagulants: Secondary | ICD-10-CM | POA: Insufficient documentation

## 2023-11-01 LAB — PROTIME-INR
INR: 0.9 (ref 0.8–1.2)
Prothrombin Time: 12.6 s (ref 11.4–15.2)

## 2023-11-01 LAB — CBC
HCT: 37.4 % — ABNORMAL LOW (ref 39.0–52.0)
Hemoglobin: 13.1 g/dL (ref 13.0–17.0)
MCH: 32.7 pg (ref 26.0–34.0)
MCHC: 35 g/dL (ref 30.0–36.0)
MCV: 93.3 fL (ref 80.0–100.0)
Platelets: 258 10*3/uL (ref 150–400)
RBC: 4.01 MIL/uL — ABNORMAL LOW (ref 4.22–5.81)
RDW: 14.6 % (ref 11.5–15.5)
WBC: 7.7 10*3/uL (ref 4.0–10.5)
nRBC: 0 % (ref 0.0–0.2)

## 2023-11-01 LAB — COMPREHENSIVE METABOLIC PANEL WITH GFR
ALT: 11 U/L (ref 0–44)
AST: 15 U/L (ref 15–41)
Albumin: 3.7 g/dL (ref 3.5–5.0)
Alkaline Phosphatase: 70 U/L (ref 38–126)
Anion gap: 12 (ref 5–15)
BUN: 30 mg/dL — ABNORMAL HIGH (ref 8–23)
CO2: 23 mmol/L (ref 22–32)
Calcium: 8.8 mg/dL — ABNORMAL LOW (ref 8.9–10.3)
Chloride: 98 mmol/L (ref 98–111)
Creatinine, Ser: 1.2 mg/dL (ref 0.61–1.24)
GFR, Estimated: >60 mL/min (ref >60)
Glucose, Bld: 370 mg/dL — ABNORMAL HIGH (ref 70–99)
Potassium: 4.2 mmol/L (ref 3.5–5.1)
Sodium: 133 mmol/L — ABNORMAL LOW (ref 135–145)
Total Bilirubin: 0.7 mg/dL (ref 0.0–1.2)
Total Protein: 6.9 g/dL (ref 6.5–8.1)

## 2023-11-01 MED ORDER — IOHEXOL 300 MG/ML  SOLN
100.0000 mL | Freq: Once | INTRAMUSCULAR | Status: AC | PRN
  Administered 2023-11-01: 100 mL via INTRAVENOUS

## 2023-11-01 NOTE — ED Triage Notes (Addendum)
 Pt reports the past 2 weeks he has been having difficulty moving due to right hip pain. Pt reports falling into bath tub last week. Pt is right side bka. Pt reports he fell again today and injured left hip.

## 2023-11-01 NOTE — Discharge Instructions (Signed)
 You are seen in the emergency department following a fall.  You had CT scan done of your head, neck and your abdomen and pelvis that showed a large hematoma to your back.  Your lab work was otherwise normal.  No broken bones or internal bleeding.  Follow-up closely and call your primary care physician in the morning so they can follow-up your hematoma and make sure it is healing well.  Return to the emergency department if you develop any lightheadedness, shortness of breath or worsening symptoms.  Pain control:  Acetaminophen  (tylenol ) - You can take 2 extra strength tablets (1000 mg) every 6 hours as needed for pain/fever.

## 2023-11-01 NOTE — ED Provider Notes (Signed)
 [facility] Provider Note    Event Date/Time   First MD Initiated Contact with Patient 11/01/23 0151     (approximate)   History   Hip Pain   HPI  Timothy [name]. is a 63 y.o. male past medical history significant for recent CVA and discharged on Eliquis , who presents to the emergency department following a fall.  Patient has a BKA and has had multiple falls since the stroke.  States that he fell causing him to hit his back.  Complaining of pain to his back and his hips.  Denies any head injury or loss of consciousness.  Denies nausea or vomiting.  Denies any chest pain.  Denies fever, chills or dysuria with urinary urgency or frequency.     Physical Exam   Triage Vital Signs: ED Triage Vitals  Encounter Vitals Group     BP 11/01/23 0019 111/63     Systolic BP Percentile --      Diastolic BP Percentile --      Pulse Rate 11/01/23 0019 79     Resp 11/01/23 0019 18     Temp 11/01/23 0019 98.5 F (36.9 C)     Temp Source 11/01/23 0019 Oral     SpO2 11/01/23 0019 98 %     Weight 11/01/23 0018 185 lb (83.9 kg)     Height 11/01/23 0018 5\' 10"  (1.778 m)     Head Circumference --      Peak Flow --      Pain Score 11/01/23 0017 8     Pain Loc --      Pain Education --      Exclude from Growth Chart --     Most recent vital signs: Vitals:   11/01/23 0019  BP: 111/63  Pulse: 79  Resp: 18  Temp: 98.5 F (36.9 C)  SpO2: 98%    Physical Exam HENT:     Head: Atraumatic.     Right Ear: External ear normal.     Left Ear: External ear normal.  Eyes:     Extraocular Movements: Extraocular movements intact.     Pupils: Pupils are equal, round, and reactive to light.  Cardiovascular:     Rate and Rhythm: Normal rate.  Pulmonary:     Effort: Pulmonary effort is normal.  Abdominal:     General: There is no distension.     Tenderness: There is no abdominal tenderness. There is no right CVA tenderness, left CVA tenderness or guarding.   Musculoskeletal:        General: Normal range of motion.     Cervical back: Normal range of motion and neck supple. No tenderness.     Comments: Right BKA.  Good pulse to the left lower extremity with no obvious trauma.  Skin:    General: Skin is warm.     Findings: Bruising present.     Comments: Large hematoma to the lumbar spine.  No midline cervical, thoracic or lumbar tenderness to palpation.  Neurological:     Mental Status: He is alert. Mental status is at baseline.     IMPRESSION / MDM / ASSESSMENT AND PLAN / ED COURSE  I reviewed the triage vital signs and the nursing notes.  Differential diagnosis including fracture, cranial hemorrhage, hematoma, anemia.  Given that the patient is on anticoagulation with a large hematoma will obtain CT imaging and lab work.  Offered pain med patient however patient declined  RADIOLOGY I independently reviewed  imaging, my interpretation of imaging: CT scan of the head with no signs of acute intracranial hemorrhage  CT cervical spine with no acute fracture low suspicion for ligamentous injury no neck pain.  CT scan abdomen and pelvis with a large hematoma to the lumbar spine.  No other acute traumatic injuries.  Cholelithiasis.  Hip x-ray with no acute fracture or dislocation  LABS (all labs ordered are listed, but only abnormal results are displayed) Labs interpreted as -    Labs Reviewed  CBC - Abnormal; Notable for the following components:      Result Value   RBC 4.01 (*)    HCT 37.4 (*)    All other components within normal limits  COMPREHENSIVE METABOLIC PANEL WITH GFR - Abnormal; Notable for the following components:   Sodium 133 (*)    Glucose, Bld 370 (*)    BUN 30 (*)    Calcium  8.8 (*)    All other components within normal limits  PROTIME-INR     MDM  No significant leukocytosis.  Hyperglycemia but no concern for DKA.  Normal mentation no concern for hyperosmolar syndrome.  Patient's hemoglobin is stable.  CT  scan with a hematoma.  No other acute fracture.  Discussed close follow-up with primary care provider.  Discussed return precautions to the emergency department.     PROCEDURES:  Critical Care performed: No  Procedures  Patient's presentation is most consistent with acute presentation with potential threat to life or bodily function.   MEDICATIONS ORDERED IN ED: Medications  iohexol  (OMNIPAQUE ) 300 MG/ML solution 100 mL (100 mLs Intravenous Contrast Given 11/01/23 0322)    FINAL CLINICAL IMPRESSION(S) / ED DIAGNOSES   Final diagnoses:  Fall, initial encounter  Hematoma     Rx / DC Orders   ED Discharge Orders     None        Note:  This document was prepared using Dragon voice recognition software and may include unintentional dictation errors.   Viviano Ground, MD 11/01/23 [phone]

## 2023-11-04 ENCOUNTER — Encounter (INDEPENDENT_AMBULATORY_CARE_PROVIDER_SITE_OTHER): Payer: Self-pay | Admitting: Nurse Practitioner

## 2023-12-04 NOTE — Discharge Summary (Signed)
 Physician Discharge Summary   Patient: Timothy [name]. MRN: [id number] DOB: December 24, 1960  Admit date:     10/07/2023  Discharge date: 10/08/2023  Discharge Physician: Luna Salinas   PCP: Rory Collard, MD   Recommendations at discharge:  Patient is being transferred to behavioral health  Discharge Diagnoses: Principal Problem:   Acute CVA (cerebrovascular accident) [facility]) Active Problems:   Hypertension   Diabetes mellitus without complication (HCC)   Depression   Tobacco use   Dyslipidemia (high LDL; low HDL)   MDD (major depressive disorder), recurrent severe, without psychosis [facility])   Hospital Course: Taken from prior notes.  Timothy [name]. is a 63 y.o. male with a PMH significant for R BKA, tobacco use, type 2 diabetes, HTN, HLD, COPD, PTSD, bipolar disorder, anxiety, at baseline self sufficient for ADL but wheelchair-bound presented to ED with concern of suicidal ideation and visual hallucination since the beginning of month, visual hallucinations for quite intense so he wanted to commit suicide. Patient apparently stopped taking his medications due to financial reasons, recently moved in to a new house with a friend and started receiving disability checks and planning to restart his medications. Current smoker of 1 to 1-1/2 pack/day.  Denies illicit or regular alcohol use  On presentation stable vitals, blood glucose 306, EtOH 182.  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  Patient was admitted to [facility] for further stroke workup, neurology and psychiatry was consulted.  4/22: Vital stable, psych is recommending inpatient behavioral health due to suicidal ideation with a plan after completing CVA workup. Labs with A1c of 9, UDS negative, TSH normal, significantly elevated total cholesterol at 245, triglyceride 425, HDL 37 and LDL of 141-started on high intensity statin.  Neurology is  recommending only aspirin  as his symptoms are more than 25 weeks old so no need for DAPT.  PT with no follow-up recommendations.  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  Echocardiogram was normal.  Psych was contacted again for transfer to behavioral health as patient completed stroke workup and stable for transfer.    Patient was transferred to behavioral health for further management.  Assessment and Plan: * Acute CVA (cerebrovascular accident) (HCC) MRI brain with early subacute left PCA territory infarct with some associated edema, no significant mass effect.  Patient is out of window for permissive hypertension or tPA.  Likely happened couple of weeks ago per neurology.  Completed the stroke workup. Neurology is recommending Physically patient is at baseline with no PT recommendations. -Aspirin  and statin -Better control of diabetes and hypertension -Smoking cessation  Hypertension Blood pressure elevated.  Patient was not taking his medications for a while. -Continue metoprolol  -Added low-dose losartan   Diabetes mellitus without complication (HCC) Hyperglycemia with elevated A1c at 9. Per medical record patient was supposed to take Lantus  and metformin  but he was not taking any of the medications for a while. -Increasing the dose of Semglee  -Continue with SSI -Patient need to resume Lantus  and metformin  on discharge  Depression History of bipolar disorder Suicide ideation with a plan Patient continued to thought about harming himself. Psychiatry on board and recommending inpatient behavioral health -Patient is medically stable to transfer to behavioral health -Continue current medications  Tobacco use Counseling was provided -Continue with nicotine  patch  Dyslipidemia (high LDL; low HDL) Patient was started on high intensity statin   Consultants: Neurology Procedures  performed: None Disposition: Behavioral  health Diet recommendation:  Cardiac diet DISCHARGE MEDICATION: Allergies as of 10/08/2023       Reactions   Bee Venom Anaphylaxis   Cheese Anaphylaxis, Swelling, Other (See Comments)   Blue Cheese only   Coffea Arabica Shortness Of Breath   Coffee Flavoring Agent (non-screening) Shortness Of Breath        Medication List     ASK your doctor about these medications    aspirin  EC 81 MG tablet Take 1 tablet (81 mg total) by mouth daily. Swallow whole.   blood glucose meter kit and supplies Kit Dispense based on patient and insurance preference. Use up to four times daily as directed. (FOR ICD-9 250.00, 250.01).   cyanocobalamin  1000 MCG tablet Commonly known as: VITAMIN B12 Take 1 tablet (1,000 mcg total) by mouth daily.   Eliquis  5 MG Tabs tablet Generic drug: apixaban  Take 5 mg by mouth 2 (two) times daily.   metFORMIN  1000 MG tablet Commonly known as: GLUCOPHAGE  TAKE ONE TABLET (1,000MG  TOTAL) BY MOUTH TWICE DAILY.   metoprolol  succinate 50 MG 24 hr tablet Commonly known as: TOPROL -XL Take 50 mg by mouth daily.   mometasone -formoterol  100-5 MCG/ACT Aero Commonly known as: DULERA  Inhale 2 puffs into the lungs 2 (two) times daily.   pantoprazole  40 MG tablet Commonly known as: PROTONIX  TAKE ONE TABLET (40MG  TOTAL) BY MOUTH ONCE DAILY.   Pen Needles 32G X 6 MM Misc 1 each by Does not apply route daily.   rosuvastatin  5 MG tablet Commonly known as: Crestor  TAKE ONE TABLET (5MG  TOTAL) BY MOUTH ONCE DAILY.        Discharge Exam: Filed Weights   10/07/23 0026  Weight: 81.6 kg   General.     In no acute distress. Pulmonary.  Lungs clear bilaterally, normal respiratory effort. CV.  Regular rate and rhythm, no JVD, rub or murmur. Abdomen.  Soft, nontender, nondistended, BS positive. CNS.  Alert and oriented .  No focal neurologic deficit. Extremities.  No edema, no cyanosis, pulses intact and symmetrical. Psychiatry.  Judgment and insight appears normal.    Condition at discharge: stable  The results of significant diagnostics from this hospitalization (including imaging, microbiology, ancillary and laboratory) are listed below for reference.   Imaging Studies: No results found.  Microbiology: Results for orders placed or performed during the hospital encounter of 10/07/23  SARS Coronavirus 2 by RT PCR (hospital order, performed in [facility] hospital lab) *cepheid single result test* Anterior Nasal Swab     Status: None   Collection Time: 10/08/23  6:15 PM   Specimen: Anterior Nasal Swab  Result Value Ref Range Status   SARS Coronavirus 2 by RT PCR NEGATIVE NEGATIVE Final    Comment: (NOTE) SARS-CoV-2 target nucleic acids are NOT DETECTED.  The SARS-CoV-2 RNA is generally detectable in upper and lower respiratory specimens during the acute phase of infection. The lowest concentration of SARS-CoV-2 viral copies this assay can detect is 250 copies / mL. A negative result does not preclude SARS-CoV-2 infection and should not be used as the sole basis for treatment or other patient management decisions.  A negative result may occur with improper specimen collection / handling, submission of specimen other than nasopharyngeal swab, presence of viral mutation(s) within the areas targeted by this assay, and inadequate number of viral copies (<250 copies / mL). A negative result must be combined with clinical observations, patient history, and epidemiological information.  Fact Sheet for Patients:   RoadLapTop.co.za  Fact Sheet for Healthcare Providers: http://kim-miller.com/  This test is not yet approved or  cleared by the [place]  FDA and has been authorized for detection and/or diagnosis of SARS-CoV-2 by FDA under an Emergency Use Authorization (EUA).  This EUA will remain in effect (meaning this test can be used) for the duration of the COVID-19 declaration under Section  564(b)(1) of the Act, 21 U.S.C. section 360bbb-3(b)(1), unless the authorization is terminated or revoked sooner.  Performed at [facility], [address] [place], [place] [address]     Labs: CBC: No results for input(s): WBC, NEUTROABS, HGB, HCT, MCV, PLT in the last 168 hours. Basic Metabolic Panel: No results for input(s): NA, K, CL, CO2, GLUCOSE, BUN, CREATININE, CALCIUM , MG, PHOS in the last 168 hours. Liver Function Tests: No results for input(s): AST, ALT, ALKPHOS, BILITOT, PROT, ALBUMIN in the last 168 hours. CBG: No results for input(s): GLUCAP in the last 168 hours.  Discharge time spent: greater than 30 minutes.  This record has been created using Conservation officer, historic buildings. Errors have been sought and corrected,but may not always be located. Such creation errors do not reflect on the standard of care.   Signed: Luna Salinas, MD [facility] 12/04/2023

## 2024-01-21 NOTE — Progress Notes (Signed)
 This encounter was created in error - please disregard.

## 2024-04-10 ENCOUNTER — Emergency Department: Payer: MEDICAID

## 2024-04-10 ENCOUNTER — Other Ambulatory Visit: Payer: Self-pay

## 2024-04-10 ENCOUNTER — Inpatient Hospital Stay
Admission: EM | Admit: 2024-04-10 | Discharge: 2024-04-22 | DRG: 564 | Disposition: A | Discharge status: Home / Self Care | Payer: MEDICAID | Attending: Student | Admitting: Student

## 2024-04-10 DIAGNOSIS — Z7901 Long term (current) use of anticoagulants: Secondary | ICD-10-CM | POA: Diagnosis not present

## 2024-04-10 DIAGNOSIS — F0280 Dementia in other diseases classified elsewhere without behavioral disturbance: Secondary | ICD-10-CM | POA: Diagnosis present

## 2024-04-10 DIAGNOSIS — N179 Acute kidney failure, unspecified: Secondary | ICD-10-CM | POA: Diagnosis present

## 2024-04-10 DIAGNOSIS — F10188 Alcohol abuse with other alcohol-induced disorder: Secondary | ICD-10-CM | POA: Diagnosis present

## 2024-04-10 DIAGNOSIS — Z794 Long term (current) use of insulin: Secondary | ICD-10-CM

## 2024-04-10 DIAGNOSIS — W19XXXA Unspecified fall, initial encounter: Secondary | ICD-10-CM | POA: Diagnosis present

## 2024-04-10 DIAGNOSIS — E1165 Type 2 diabetes mellitus with hyperglycemia: Secondary | ICD-10-CM | POA: Diagnosis present

## 2024-04-10 DIAGNOSIS — R296 Repeated falls: Secondary | ICD-10-CM | POA: Diagnosis present

## 2024-04-10 DIAGNOSIS — F17210 Nicotine dependence, cigarettes, uncomplicated: Secondary | ICD-10-CM | POA: Diagnosis present

## 2024-04-10 DIAGNOSIS — E876 Hypokalemia: Secondary | ICD-10-CM | POA: Diagnosis present

## 2024-04-10 DIAGNOSIS — Z7951 Long term (current) use of inhaled steroids: Secondary | ICD-10-CM

## 2024-04-10 DIAGNOSIS — R251 Tremor, unspecified: Secondary | ICD-10-CM | POA: Diagnosis present

## 2024-04-10 DIAGNOSIS — E785 Hyperlipidemia, unspecified: Secondary | ICD-10-CM | POA: Diagnosis not present

## 2024-04-10 DIAGNOSIS — Z7984 Long term (current) use of oral hypoglycemic drugs: Secondary | ICD-10-CM

## 2024-04-10 DIAGNOSIS — F419 Anxiety disorder, unspecified: Secondary | ICD-10-CM | POA: Diagnosis present

## 2024-04-10 DIAGNOSIS — R45851 Suicidal ideations: Secondary | ICD-10-CM | POA: Diagnosis present

## 2024-04-10 DIAGNOSIS — D72829 Elevated white blood cell count, unspecified: Secondary | ICD-10-CM | POA: Diagnosis present

## 2024-04-10 DIAGNOSIS — F32A Depression, unspecified: Secondary | ICD-10-CM | POA: Diagnosis present

## 2024-04-10 DIAGNOSIS — Z89511 Acquired absence of right leg below knee: Secondary | ICD-10-CM

## 2024-04-10 DIAGNOSIS — Z7141 Alcohol abuse counseling and surveillance of alcoholic: Secondary | ICD-10-CM

## 2024-04-10 DIAGNOSIS — E7800 Pure hypercholesterolemia, unspecified: Secondary | ICD-10-CM | POA: Diagnosis present

## 2024-04-10 DIAGNOSIS — Y92009 Unspecified place in unspecified non-institutional (private) residence as the place of occurrence of the external cause: Secondary | ICD-10-CM | POA: Diagnosis not present

## 2024-04-10 DIAGNOSIS — L89523 Pressure ulcer of left ankle, stage 3: Secondary | ICD-10-CM | POA: Diagnosis present

## 2024-04-10 DIAGNOSIS — E871 Hypo-osmolality and hyponatremia: Secondary | ICD-10-CM | POA: Diagnosis present

## 2024-04-10 DIAGNOSIS — R4182 Altered mental status, unspecified: Secondary | ICD-10-CM

## 2024-04-10 DIAGNOSIS — T796XXA Traumatic ischemia of muscle, initial encounter: Secondary | ICD-10-CM | POA: Diagnosis present

## 2024-04-10 DIAGNOSIS — M6282 Rhabdomyolysis: Secondary | ICD-10-CM | POA: Diagnosis not present

## 2024-04-10 DIAGNOSIS — I10 Essential (primary) hypertension: Secondary | ICD-10-CM | POA: Diagnosis present

## 2024-04-10 DIAGNOSIS — I110 Hypertensive heart disease with heart failure: Secondary | ICD-10-CM | POA: Diagnosis present

## 2024-04-10 DIAGNOSIS — J449 Chronic obstructive pulmonary disease, unspecified: Secondary | ICD-10-CM | POA: Diagnosis present

## 2024-04-10 DIAGNOSIS — Z79899 Other long term (current) drug therapy: Secondary | ICD-10-CM

## 2024-04-10 DIAGNOSIS — Z91030 Bee allergy status: Secondary | ICD-10-CM

## 2024-04-10 DIAGNOSIS — F319 Bipolar disorder, unspecified: Secondary | ICD-10-CM | POA: Diagnosis present

## 2024-04-10 DIAGNOSIS — Z7902 Long term (current) use of antithrombotics/antiplatelets: Secondary | ICD-10-CM

## 2024-04-10 DIAGNOSIS — Z59869 Financial insecurity, unspecified: Secondary | ICD-10-CM

## 2024-04-10 DIAGNOSIS — Z7982 Long term (current) use of aspirin: Secondary | ICD-10-CM

## 2024-04-10 DIAGNOSIS — Z8673 Personal history of transient ischemic attack (TIA), and cerebral infarction without residual deficits: Secondary | ICD-10-CM

## 2024-04-10 DIAGNOSIS — E1140 Type 2 diabetes mellitus with diabetic neuropathy, unspecified: Secondary | ICD-10-CM | POA: Diagnosis present

## 2024-04-10 DIAGNOSIS — Z91018 Allergy to other foods: Secondary | ICD-10-CM

## 2024-04-10 DIAGNOSIS — Z9151 Personal history of suicidal behavior: Secondary | ICD-10-CM

## 2024-04-10 DIAGNOSIS — F1010 Alcohol abuse, uncomplicated: Principal | ICD-10-CM

## 2024-04-10 DIAGNOSIS — Z993 Dependence on wheelchair: Secondary | ICD-10-CM

## 2024-04-10 LAB — COMPREHENSIVE METABOLIC PANEL WITH GFR
ALT: 35 U/L (ref 0–44)
AST: 92 U/L — ABNORMAL HIGH (ref 15–41)
Albumin: 3.7 g/dL (ref 3.5–5.0)
Alkaline Phosphatase: 80 U/L (ref 38–126)
Anion gap: 17 — ABNORMAL HIGH (ref 5–15)
BUN: 56 mg/dL — ABNORMAL HIGH (ref 8–23)
CO2: 30 mmol/L (ref 22–32)
Calcium: 12.5 mg/dL — ABNORMAL HIGH (ref 8.9–10.3)
Chloride: 89 mmol/L — ABNORMAL LOW (ref 98–111)
Creatinine, Ser: 1.47 mg/dL — ABNORMAL HIGH (ref 0.61–1.24)
GFR, Estimated: 54 mL/min — ABNORMAL LOW (ref >60)
Glucose, Bld: 206 mg/dL — ABNORMAL HIGH (ref 70–99)
Potassium: 3.2 mmol/L — ABNORMAL LOW (ref 3.5–5.1)
Sodium: 136 mmol/L (ref 135–145)
Total Bilirubin: 1.3 mg/dL — ABNORMAL HIGH (ref 0.0–1.2)
Total Protein: 7.8 g/dL (ref 6.5–8.1)

## 2024-04-10 LAB — CBC WITH DIFFERENTIAL/PLATELET
Abs Immature Granulocytes: 0.05 K/uL (ref 0.00–0.07)
Basophils Absolute: 0.1 K/uL (ref 0.0–0.1)
Basophils Relative: 1 %
Eosinophils Absolute: 0.2 K/uL (ref 0.0–0.5)
Eosinophils Relative: 1 %
HCT: 44 % (ref 39.0–52.0)
Hemoglobin: 15.3 g/dL (ref 13.0–17.0)
Immature Granulocytes: 1 %
Lymphocytes Relative: 15 %
Lymphs Abs: 1.7 K/uL (ref 0.7–4.0)
MCH: 31.1 pg (ref 26.0–34.0)
MCHC: 34.8 g/dL (ref 30.0–36.0)
MCV: 89.4 fL (ref 80.0–100.0)
Monocytes Absolute: 1 K/uL (ref 0.1–1.0)
Monocytes Relative: 10 %
Neutro Abs: 8 K/uL — ABNORMAL HIGH (ref 1.7–7.7)
Neutrophils Relative %: 72 %
Platelets: 260 K/uL (ref 150–400)
RBC: 4.92 MIL/uL (ref 4.22–5.81)
RDW: 12.5 % (ref 11.5–15.5)
WBC: 11 K/uL — ABNORMAL HIGH (ref 4.0–10.5)
nRBC: 0 % (ref 0.0–0.2)

## 2024-04-10 LAB — TROPONIN I (HIGH SENSITIVITY): Troponin I (High Sensitivity): 16 ng/L (ref <18)

## 2024-04-10 LAB — VITAMIN D 25 HYDROXY (VIT D DEFICIENCY, FRACTURES): Vit D, 25-Hydroxy: 10.17 ng/mL — ABNORMAL LOW (ref 30–100)

## 2024-04-10 LAB — CK: Total CK: 2327 U/L — ABNORMAL HIGH (ref 49–397)

## 2024-04-10 LAB — CBG MONITORING, ED
Glucose-Capillary: 181 mg/dL — ABNORMAL HIGH (ref 70–99)
Glucose-Capillary: 158 mg/dL — ABNORMAL HIGH (ref 70–99)

## 2024-04-10 LAB — LACTIC ACID, PLASMA: Lactic Acid, Venous: 1.7 mmol/L (ref 0.5–1.9)

## 2024-04-10 LAB — HIV ANTIBODY (ROUTINE TESTING W REFLEX): HIV Screen 4th Generation wRfx: NONREACTIVE

## 2024-04-10 LAB — ETHANOL: Alcohol, Ethyl (B): <15 mg/dL (ref <15)

## 2024-04-10 LAB — TSH: TSH: 2.635 u[IU]/mL (ref 0.350–4.500)

## 2024-04-10 LAB — HEMOGLOBIN A1C
Hgb A1c MFr Bld: 8.1 % — ABNORMAL HIGH (ref 4.8–5.6)
Mean Plasma Glucose: 185.77 mg/dL

## 2024-04-10 MED ORDER — MELATONIN 5 MG PO TABS
5.0000 mg | ORAL_TABLET | Freq: Every evening | ORAL | Status: DC | PRN
  Administered 2024-04-14 – 2024-04-21 (×8): 5 mg via ORAL
  Filled 2024-04-10 (×8): qty 1

## 2024-04-10 MED ORDER — VITAMIN B-12 1000 MCG PO TABS
1000.0000 ug | ORAL_TABLET | Freq: Every day | ORAL | Status: DC
  Administered 2024-04-11 – 2024-04-22 (×10): 1000 ug via ORAL
  Filled 2024-04-10 (×9): qty 1
  Filled 2024-04-10: qty 2
  Filled 2024-04-10 (×3): qty 1

## 2024-04-10 MED ORDER — HYDRALAZINE HCL 20 MG/ML IJ SOLN: 5.0000 mg | Freq: Four times a day (QID) | INTRAMUSCULAR | Status: DC | PRN

## 2024-04-10 MED ORDER — ONDANSETRON HCL 4 MG PO TABS: 4.0000 mg | ORAL_TABLET | Freq: Four times a day (QID) | ORAL | Status: DC | PRN

## 2024-04-10 MED ORDER — LORAZEPAM 2 MG/ML IJ SOLN
2.0000 mg | Freq: Once | INTRAMUSCULAR | Status: AC
  Administered 2024-04-10: 2 mg via INTRAVENOUS
  Filled 2024-04-10: qty 1

## 2024-04-10 MED ORDER — POTASSIUM CHLORIDE CRYS ER 20 MEQ PO TBCR
40.0000 meq | EXTENDED_RELEASE_TABLET | Freq: Every day | ORAL | Status: DC
  Administered 2024-04-12: 40 meq via ORAL
  Filled 2024-04-10 (×5): qty 2

## 2024-04-10 MED ORDER — PANTOPRAZOLE SODIUM 40 MG PO TBEC
40.0000 mg | DELAYED_RELEASE_TABLET | Freq: Every day | ORAL | Status: DC
  Administered 2024-04-12 – 2024-04-22 (×9): 40 mg via ORAL
  Filled 2024-04-10 (×13): qty 1

## 2024-04-10 MED ORDER — INSULIN ASPART 100 UNIT/ML IJ SOLN: 0.0000 [IU] | Freq: Three times a day (TID) | INTRAMUSCULAR | Status: DC

## 2024-04-10 MED ORDER — THIAMINE HCL 100 MG/ML IJ SOLN
100.0000 mg | Freq: Every day | INTRAMUSCULAR | Status: DC
  Administered 2024-04-10 – 2024-04-14 (×4): 100 mg via INTRAVENOUS
  Filled 2024-04-10 (×6): qty 2

## 2024-04-10 MED ORDER — ONDANSETRON HCL 4 MG/2ML IJ SOLN: 4.0000 mg | Freq: Four times a day (QID) | INTRAMUSCULAR | Status: DC | PRN

## 2024-04-10 MED ORDER — INSULIN ASPART 100 UNIT/ML IJ SOLN
0.0000 [IU] | Freq: Three times a day (TID) | INTRAMUSCULAR | Status: DC
  Administered 2024-04-11: 3 [IU] via SUBCUTANEOUS
  Administered 2024-04-13: 2 [IU] via SUBCUTANEOUS
  Administered 2024-04-15 (×2): 3 [IU] via SUBCUTANEOUS
  Administered 2024-04-15: 5 [IU] via SUBCUTANEOUS
  Administered 2024-04-16: 2 [IU] via SUBCUTANEOUS
  Administered 2024-04-16 – 2024-04-17 (×3): 3 [IU] via SUBCUTANEOUS
  Administered 2024-04-17 (×2): 5 [IU] via SUBCUTANEOUS
  Administered 2024-04-18 (×2): 3 [IU] via SUBCUTANEOUS
  Administered 2024-04-18: 5 [IU] via SUBCUTANEOUS
  Administered 2024-04-19: 3 [IU] via SUBCUTANEOUS
  Administered 2024-04-19: 8 [IU] via SUBCUTANEOUS
  Administered 2024-04-19: 3 [IU] via SUBCUTANEOUS
  Administered 2024-04-20: 5 [IU] via SUBCUTANEOUS
  Administered 2024-04-20 – 2024-04-21 (×3): 8 [IU] via SUBCUTANEOUS
  Administered 2024-04-21: 5 [IU] via SUBCUTANEOUS
  Administered 2024-04-21 – 2024-04-22 (×2): 8 [IU] via SUBCUTANEOUS
  Administered 2024-04-22: 15 [IU] via SUBCUTANEOUS
  Filled 2024-04-10 (×30): qty 1

## 2024-04-10 MED ORDER — FLUTICASONE FUROATE-VILANTEROL 100-25 MCG/ACT IN AEPB
1.0000 | INHALATION_SPRAY | Freq: Every day | RESPIRATORY_TRACT | Status: DC
  Administered 2024-04-12 – 2024-04-22 (×8): 1 via RESPIRATORY_TRACT
  Filled 2024-04-10 (×2): qty 28

## 2024-04-10 MED ORDER — THIAMINE MONONITRATE 100 MG PO TABS
100.0000 mg | ORAL_TABLET | Freq: Every day | ORAL | Status: DC
  Administered 2024-04-12 – 2024-04-22 (×9): 100 mg via ORAL
  Filled 2024-04-10 (×11): qty 1

## 2024-04-10 MED ORDER — LORAZEPAM 2 MG PO TABS: 0.0000 mg | ORAL_TABLET | Freq: Two times a day (BID) | ORAL | Status: DC

## 2024-04-10 MED ORDER — SODIUM CHLORIDE 0.9 % IV SOLN: INTRAVENOUS | Status: DC

## 2024-04-10 MED ORDER — LORAZEPAM 2 MG/ML IJ SOLN
0.0000 mg | Freq: Four times a day (QID) | INTRAMUSCULAR | Status: AC
  Administered 2024-04-11: 2 mg via INTRAVENOUS
  Filled 2024-04-10 (×2): qty 1

## 2024-04-10 MED ORDER — HEPARIN SODIUM (PORCINE) 5000 UNIT/ML IJ SOLN
5000.0000 [IU] | Freq: Three times a day (TID) | INTRAMUSCULAR | Status: DC
  Administered 2024-04-10 – 2024-04-11 (×4): 5000 [IU] via SUBCUTANEOUS
  Filled 2024-04-10 (×4): qty 1

## 2024-04-10 MED ORDER — NICOTINE 21 MG/24HR TD PT24
21.0000 mg | MEDICATED_PATCH | Freq: Every day | TRANSDERMAL | Status: DC
  Administered 2024-04-10 – 2024-04-22 (×13): 21 mg via TRANSDERMAL
  Filled 2024-04-10 (×13): qty 1

## 2024-04-10 MED ORDER — INSULIN GLARGINE-YFGN 100 UNIT/ML ~~LOC~~ SOLN
20.0000 [IU] | Freq: Every day | SUBCUTANEOUS | Status: DC
  Administered 2024-04-10: 20 [IU] via SUBCUTANEOUS
  Filled 2024-04-10 (×2): qty 0.2

## 2024-04-10 MED ORDER — LACTATED RINGERS IV BOLUS
1000.0000 mL | Freq: Once | INTRAVENOUS | Status: AC
  Administered 2024-04-10: 1000 mL via INTRAVENOUS

## 2024-04-10 MED ORDER — INSULIN ASPART 100 UNIT/ML IJ SOLN
0.0000 [IU] | Freq: Every day | INTRAMUSCULAR | Status: DC
  Administered 2024-04-14 – 2024-04-18 (×2): 2 [IU] via SUBCUTANEOUS
  Administered 2024-04-21: 3 [IU] via SUBCUTANEOUS
  Filled 2024-04-10 (×4): qty 1

## 2024-04-10 MED ORDER — INSULIN ASPART 100 UNIT/ML IJ SOLN
3.0000 [IU] | Freq: Three times a day (TID) | INTRAMUSCULAR | Status: DC
  Administered 2024-04-11 – 2024-04-12 (×2): 3 [IU] via SUBCUTANEOUS
  Filled 2024-04-10 (×3): qty 1

## 2024-04-10 MED ORDER — SODIUM CHLORIDE 0.9% FLUSH: 3.0000 mL | INTRAVENOUS | Status: DC | PRN

## 2024-04-10 MED ORDER — CLOPIDOGREL BISULFATE 75 MG PO TABS
75.0000 mg | ORAL_TABLET | Freq: Every day | ORAL | Status: DC
  Administered 2024-04-11 – 2024-04-22 (×10): 75 mg via ORAL
  Filled 2024-04-10 (×13): qty 1

## 2024-04-10 MED ORDER — POLYETHYLENE GLYCOL 3350 17 G PO PACK: 17.0000 g | PACK | Freq: Every day | ORAL | Status: DC | PRN

## 2024-04-10 MED ORDER — LORAZEPAM 2 MG/ML IJ SOLN: 0.0000 mg | Freq: Two times a day (BID) | INTRAMUSCULAR | Status: DC

## 2024-04-10 MED ORDER — GABAPENTIN 300 MG PO CAPS
400.0000 mg | ORAL_CAPSULE | Freq: Three times a day (TID) | ORAL | Status: DC
Start: 2024-04-10 — End: 2024-04-10
  Filled 2024-04-10: qty 1

## 2024-04-10 MED ORDER — SODIUM CHLORIDE 0.9% FLUSH
3.0000 mL | Freq: Two times a day (BID) | INTRAVENOUS | Status: DC
  Administered 2024-04-11 – 2024-04-22 (×22): 3 mL via INTRAVENOUS

## 2024-04-10 MED ORDER — METOPROLOL SUCCINATE ER 50 MG PO TB24
50.0000 mg | ORAL_TABLET | Freq: Every day | ORAL | Status: DC
  Administered 2024-04-12 – 2024-04-22 (×8): 50 mg via ORAL
  Filled 2024-04-10 (×12): qty 1

## 2024-04-10 MED ORDER — LORAZEPAM 2 MG PO TABS
0.0000 mg | ORAL_TABLET | Freq: Four times a day (QID) | ORAL | Status: AC
  Administered 2024-04-11: 2 mg via ORAL
  Administered 2024-04-12: 4 mg via ORAL
  Filled 2024-04-10: qty 2
  Filled 2024-04-10: qty 1

## 2024-04-10 MED ORDER — INSULIN ASPART 100 UNIT/ML IJ SOLN: 0.0000 [IU] | Freq: Every day | INTRAMUSCULAR | Status: DC

## 2024-04-10 MED ORDER — SODIUM CHLORIDE 0.9 % IV SOLN: 250.0000 mL | INTRAVENOUS | Status: AC | PRN

## 2024-04-10 NOTE — ED Provider Notes (Signed)
 [facility] Provider Note    Event Date/Time   First MD Initiated Contact with Patient 04/10/24 1133     (approximate)   History   Altered Mental Status   HPI  Timothy [name]. is a 63 y.o. male with a history of CVA, diabetes, hypertension, hyperlipidemia, COPD, major depressive disorder, bipolar disorder, anxiety who presents with weakness and altered mental status.  EMS reported that the patient was found on the floor at his home when someone came to check on him.  He was last seen 2 days ago.  It is unknown how long he was on the floor.  EMS noted multiple empty alcohol bottles on the scene and with stale urine odor.  There was also evidence that the patient had been attempting to get up.  The patient is unable to give much history.  He denies any acute pain.  He does endorse alcohol use but states he does not drink every day.  He knows he is in the hospital but does not know why or how he got here.  I reviewed the past medical records.  The patient's most recent outpatient encounter in our system was on 6/9 with family medicine for follow-up after a hospitalization.  He was admitted to inpatient psychiatry in April for an episode of major depressive disorder with SI.   Physical Exam   Triage Vital Signs: ED Triage Vitals  Encounter Vitals Group     BP      Girls Systolic BP Percentile      Girls Diastolic BP Percentile      Boys Systolic BP Percentile      Boys Diastolic BP Percentile      Pulse      Resp      Temp      Temp src      SpO2      Weight      Height      Head Circumference      Peak Flow      Pain Score      Pain Loc      Pain Education      Exclude from Growth Chart     Most recent vital signs: Vitals:   04/10/24 1146  BP: 124/88  Pulse: 85  Resp: 18  Temp: 98.5 F (36.9 C)  SpO2: 96%     General: Somnolent but arousable, oriented x 2, no distress.  CV:  Good peripheral perfusion.  Resp:  Normal effort.  Lungs  CTAB. Abd:  Soft and nontender.  No distention.  Other:  EOMI.  PERRLA.  No facial droop.  Normal speech.  Motor intact in all extremities.  Diffuse tremors.  Very dry mucous membranes.   ED Results / Procedures / Treatments   Labs (all labs ordered are listed, but only abnormal results are displayed) Labs Reviewed  CBC WITH DIFFERENTIAL/PLATELET - Abnormal; Notable for the following components:      Result Value   WBC 11.0 (*)    Neutro Abs 8.0 (*)    All other components within normal limits  COMPREHENSIVE METABOLIC PANEL WITH GFR - Abnormal; Notable for the following components:   Potassium 3.2 (*)    Chloride 89 (*)    Glucose, Bld 206 (*)    BUN 56 (*)    Creatinine, Ser 1.47 (*)    Calcium  12.5 (*)    AST 92 (*)    Total Bilirubin 1.3 (*)  GFR, Estimated 54 (*)    Anion gap 17 (*)    All other components within normal limits  CK - Abnormal; Notable for the following components:   Total CK 2,327 (*)    All other components within normal limits  ETHANOL  LACTIC ACID, PLASMA  URINALYSIS, ROUTINE W REFLEX MICROSCOPIC  URINE DRUG SCREEN, QUALITATIVE ([facility] ONLY)  TROPONIN I (HIGH SENSITIVITY)     EKG  ED ECG REPORT I, Waylon Cassis, the attending physician, personally viewed and interpreted this ECG.  Date: 04/10/2024 EKG Time: 1147 Rate: 85 Rhythm: normal sinus rhythm QRS Axis: normal Intervals: RBBB ST/T Wave abnormalities: normal Narrative Interpretation: no evidence of acute ischemia   RADIOLOGY  Chest x-ray: I independently viewed and interpreted the images; there is no focal consolidation or edema  CT head: No ICH or other acute abnormality   PROCEDURES:  Critical Care performed: Yes, see critical care procedure note(s)  .Critical Care  Performed by: Cassis Waylon, MD Authorized by: Cassis Waylon, MD   Critical care provider statement:    Critical care time (minutes):  30   Critical care time was exclusive of:  Separately  billable procedures and treating other patients   Critical care was necessary to treat or prevent imminent or life-threatening deterioration of the following conditions:  CNS failure or compromise   Critical care was time spent personally by me on the following activities:  Development of treatment plan with patient or surrogate, discussions with consultants, evaluation of patient's response to treatment, examination of patient, ordering and review of laboratory studies, ordering and review of radiographic studies, ordering and performing treatments and interventions, pulse oximetry, re-evaluation of patient's condition, review of old charts and obtaining history from patient or surrogate   Care discussed with: admitting provider      MEDICATIONS ORDERED IN ED: Medications  LORazepam  (ATIVAN ) injection 0-4 mg ( Intravenous Not Given 04/10/24 1214)    Or  LORazepam  (ATIVAN ) tablet 0-4 mg ( Oral See Alternative 04/10/24 1214)  LORazepam  (ATIVAN ) injection 0-4 mg (has no administration in time range)    Or  LORazepam  (ATIVAN ) tablet 0-4 mg (has no administration in time range)  thiamine (VITAMIN B1) tablet 100 mg ( Oral See Alternative 04/10/24 1208)    Or  thiamine (VITAMIN B1) injection 100 mg (100 mg Intravenous Given 04/10/24 1208)  lactated ringers  bolus 1,000 mL (1,000 mLs Intravenous New Bag/Given 04/10/24 1206)  LORazepam  (ATIVAN ) injection 2 mg (2 mg Intravenous Given 04/10/24 1208)     IMPRESSION / MDM / ASSESSMENT AND PLAN / ED COURSE  I reviewed the triage vital signs and the nursing notes.  63 year old male with PMH as noted above presents with weakness and altered mental status after he was found on the floor in his home, last seen 2 days ago.  The patient is unable to give any history about what happened.  On exam he is somnolent appearing but fully arousable and is oriented to person and place.  Vital signs are normal.  Neurologic exam is nonfocal.  There is no visible  trauma.  Differential diagnosis is very broad and includes, but is not limited to, dehydration, electrolyte abnormality, rhabdomyolysis, AKI, UTI or other acute infection, sepsis, alcohol abuse, alcohol withdrawal, acute CVA, intracranial hemorrhage.  We will obtain CT head, chest x-ray, lab workup, give fluids, IV Ativan , and place the patient on the CIWA protocol.  Patient's presentation is most consistent with acute presentation with potential threat to life or bodily function.  The  patient is on the cardiac monitor to evaluate for evidence of arrhythmia and/or significant heart rate changes.  ----------------------------------------- 1:17 PM on 04/10/2024 -----------------------------------------  Lab workup is significant for CK of 2300 consistent with rhabdomyolysis.  There is also mild AKI.  CBC shows mild leukocytosis.  Troponin and lactate are both normal.  UDS and urinalysis are still pending.  The CT head and chest x-ray are also negative for acute findings.  After the Ativan  the patient is now sleepy but with no significant tremors.  I consulted the hospitalist; based on our discussion he agrees to evaluate the patient for admission.    FINAL CLINICAL IMPRESSION(S) / ED DIAGNOSES   Final diagnoses:  Alcohol abuse  Altered mental status, unspecified altered mental status type  Non-traumatic rhabdomyolysis     Rx / DC Orders   ED Discharge Orders     None        Note:  This document was prepared using Dragon voice recognition software and may include unintentional dictation errors.    Jacolyn Pae, MD 04/10/24 1318

## 2024-04-10 NOTE — H&P (Addendum)
 [facility] History and Physical  Timothy [name]. FMW:[id number] DOB: 09-02-1960 DOA: 04/10/2024  Referring physician: ED  PCP: Glover Lenis, MD   Patient is coming from: Home  Chief Complaint: Found lying on the floor.  HPI:   Patient is a 63 year old male with past medical history of heavy alcohol abuse, CVA, hypertension, hyperlipidemia, COPD, major depression, bipolar disorder, anxiety was brought into the hospital by EMS after he was found to be in the floor at his home. Someone came to check on him at his house and was noted to be on the floor and had  altered mental status.  There was no mention of how long he was on the floor.  He was last seen 2 days prior to this presentation.  EMS noted that he had multiple empty alcohol bottles on his floor and patient had been attempting to get up.  Of note, patient did have episode of major depressive disorder with suicide ideation and was admitted to inpatient psych in April 2025.  Patient does not remember much at this time and has been a poor historian with.  At the time of my interview patient was somnolent after receiving Ativan .  Denied any pain nausea or vomiting.    In the ED, patient was noted to have stable vitals.  Labs were notable for leukocytosis with WBC at 11.0.  Lactate of 1.7.  Potassium low at 3.2 with creatinine of 1.4.  Total bilirubin was elevated at 1.3.  Alcohol level was less than 15.  CK level was elevated at 2327.  X-ray of the chest showed no acute cardiopulmonary process but healed the left and fourth rib fracture deformity.CT head scan was negative for acute infarct but remote left PCA territory infarct was noted.  Patient was then considered for admission to the hospital for further evaluation and treatment.   Assessment/Plan:  Principal Problem:   Rhabdomyolysis Active Problems:   Hypertension   Type 2 diabetes mellitus with diabetic neuropathy, unspecified (HCC)   Depression   COPD (chronic  obstructive pulmonary disease) (HCC)   Dyslipidemia (high LDL; low HDL)   Fall and found on the floor.    Fall precautions.  Will get PT evaluation.  Likely secondary to ongoing alcohol usage.  Mild rhabdomyolysis.  Will monitor CK in AM..  Continue IV fluids.  Continue hydration with  Mild hypokalemia.  Will replenish.  Check levels in AM.  Will put on oral potassium daily.  Check levels in AM  Alcohol abuse with tremors on presentation.  Will put the patient on CIWA protocol.  Patient was mildly somnolent at the time of exam but continue thiamine folic acid.  Noted tremors in his body.  Essential hypertension. Continue metoprolol  from home.  Blood pressure seems to be stable at this time  History of CVA falls, generalized weakness, status post right below-knee amputation..  Will get PT evaluation.  CT head scan with old stroke finding.  Will continue Plavix  for now though he has not been taking it.  History of major depressive disorder, bipolar disorder/anxiety Patient will be on CIWA protocol at this time.  Takes gabapentin  at home and will hold now due to somnolence.  Supposed to be on Abilify  but not taking at home.  Will hold for now.  Mild leukocytosis.  Will continue to monitor.  Check CBC in AM.  Possible mild AKI.  Will continue IV hydration.  Baseline creatinine around 0.6-0.8.  Creatinine on presentation at 1.4.  Check BMP in AM.  History of COPD,  Appears to be breathing okay.  Continue inhalers and bronchodilators  Diabetes mellitus type 2 with mild hyperglycemia.. Continue insulin  regimen.  Continue sliding scale insulin  Accu-Cheks and long-acting insulin .  DVT prophylaxis: heparin  injection 5,000 Units Start: 04/10/24 1400 SCDs Start: 04/10/24 1323  Review of Systems:  Limited history could be obtained since patient is somnolent   Past Medical History:  Diagnosis Date   Anxiety    CHF (congestive heart failure) (HCC)    COPD (chronic obstructive pulmonary  disease) (HCC)    Depression    Diabetes mellitus without complication (HCC)    GERD (gastroesophageal reflux disease)    Hypercholesteremia    Hypertension    Pneumonia    PTSD (post-traumatic stress disorder)    Past Surgical History:  Procedure Laterality Date   AMPUTATION Left 03/22/2020   Procedure: AMPUTATION RAY-1st Ray Left Foot;  Surgeon: Neill Boas, [name];  Location: [facility] ORS;  Service: Podiatry;  Laterality: Left;   AMPUTATION Right 11/07/2022   Procedure: AMPUTATION BELOW KNEE;  Surgeon: Marea Selinda RAMAN, MD;  Location: [facility] ORS;  Service: Vascular;  Laterality: Right;   AMPUTATION TOE Left 03/16/2020   Procedure: AMPUTATION GREAT TOE;  Surgeon: Neill Boas, [name];  Location: [facility] ORS;  Service: Podiatry;  Laterality: Left;   AMPUTATION TOE Left 04/06/2022   Procedure: AMPUTATION TOE;  Surgeon: Neill Boas, [name];  Location: [facility] ORS;  Service: Podiatry;  Laterality: Left;   COLONOSCOPY WITH PROPOFOL  N/A 12/22/2019   Procedure: COLONOSCOPY WITH PROPOFOL ;  Surgeon: Jinny Carmine, MD;  Location: [facility] ENDOSCOPY;  Service: Endoscopy;  Laterality: N/A;   INCISION AND DRAINAGE ABSCESS Left 08/11/2021   Procedure: INCISION AND DRAINAGE ABSCESS;  Surgeon: Herminio Miu, MD;  Location: [facility] ORS;  Service: ENT;  Laterality: Left;   LOWER EXTREMITY ANGIOGRAPHY Left 03/18/2020   Procedure: Lower Extremity Angiography;  Surgeon: Jama Cordella MATSU, MD;  Location: [facility] INVASIVE CV LAB;  Service: Cardiovascular;  Laterality: Left;   LOWER EXTREMITY ANGIOGRAPHY Right 11/05/2022   Procedure: Lower Extremity Angiography;  Surgeon: Marea Selinda RAMAN, MD;  Location: [facility] INVASIVE CV LAB;  Service: Cardiovascular;  Laterality: Right;   TONSILLECTOMY     VIDEO ASSISTED THORACOSCOPY (VATS)/DECORTICATION Left 08/15/2020   Procedure: VIDEO ASSISTED THORACOSCOPY (VATS)/DECORTICATION;  Surgeon: Shyrl Linnie KIDD, MD;  Location: [facility] OR;  Service: Thoracic;  Laterality: Left;   WISDOM TOOTH EXTRACTION      Social  History:  reports that he has been smoking cigarettes. He has a 41 pack-year smoking history. He has never used smokeless tobacco. He reports current alcohol use of about 6.0 standard drinks of alcohol per week. He reports that he does not currently use drugs.  Allergies  Allergen Reactions   Bee Venom Anaphylaxis   Cheese Anaphylaxis, Swelling and Other (See Comments)    Blue Cheese only   Coffea Arabica Shortness Of Breath   Coffee Flavoring Agent (Non-Screening) Shortness Of Breath    Family History  Family history unknown: Yes     Prior to Admission medications   Medication Sig Start Date End Date Taking? Authorizing Provider  albuterol  (PROVENTIL ) (2.5 MG/3ML) 0.083% nebulizer solution Take 3 mLs (2.5 mg total) by nebulization every 2 (two) hours as needed for wheezing. 10/16/23   Donnelly Mellow, MD  apixaban  (ELIQUIS ) 5 MG TABS tablet Take 5 mg by mouth 2 (two) times daily. Patient not taking: Reported on 10/07/2023    [provider]  ARIPiprazole  (ABILIFY ) 5 MG tablet Take 1 tablet (5 mg total)  by mouth 2 (two) times daily. 10/16/23   Donnelly Mellow, MD  aspirin  EC 81 MG tablet Take 1 tablet (81 mg total) by mouth daily. Swallow whole. Patient not taking: Reported on 12/10/2022 11/25/22   Laurita Pillion, MD  blood glucose meter kit and supplies KIT Dispense based on patient and insurance preference. Use up to four times daily as directed. (FOR ICD-9 250.00, 250.01). 10/15/19   Iloabachie, Chioma E, NP  clopidogrel  (PLAVIX ) 75 MG tablet Take 1 tablet (75 mg total) by mouth daily. 10/16/23   Donnelly Mellow, MD  cyanocobalamin  (VITAMIN B12) 1000 MCG tablet Take 1 tablet (1,000 mcg total) by mouth daily. Patient not taking: Reported on 10/07/2023 11/24/22   Laurita Pillion, MD  gabapentin  (NEURONTIN ) 400 MG capsule Take 1 capsule (400 mg total) by mouth 3 (three) times daily. 10/16/23   Jadapalle, Sree, MD  insulin  aspart (NOVOLOG ) 100 UNIT/ML injection Inject 0-15 Units into the skin 3  (three) times daily with meals. 10/16/23   Jadapalle, Sree, MD  insulin  aspart (NOVOLOG ) 100 UNIT/ML injection Inject 0-5 Units into the skin at bedtime. 10/16/23   Jadapalle, Sree, MD  insulin  aspart (NOVOLOG ) 100 UNIT/ML injection Inject 3 Units into the skin 3 (three) times daily with meals. 10/16/23   Jadapalle, Sree, MD  insulin  glargine-yfgn (SEMGLEE ) 100 UNIT/ML injection Inject 0.2 mLs (20 Units total) into the skin at bedtime. 10/16/23   Donnelly Mellow, MD  Insulin  Pen Needle (PEN NEEDLES) 32G X 6 MM MISC 1 each by Does not apply route daily. 11/24/22   Laurita Pillion, MD  melatonin 3 MG TABS tablet Take 1.5 tablets (4.5 mg total) by mouth at bedtime as needed. 10/16/23   Jadapalle, Sree, MD  metFORMIN  (GLUCOPHAGE ) 1000 MG tablet TAKE ONE TABLET (1,000MG  TOTAL) BY MOUTH TWICE DAILY. Patient not taking: Reported on 10/07/2023 01/16/21     metoprolol  succinate (TOPROL -XL) 50 MG 24 hr tablet Take 50 mg by mouth daily. 07/21/21   [provider]  mometasone -formoterol  (DULERA ) 100-5 MCG/ACT AERO Inhale 2 puffs into the lungs 2 (two) times daily. Patient not taking: Reported on 10/07/2023 11/24/22   Laurita Pillion, MD  nicotine  (NICODERM CQ  - DOSED IN MG/24 HOURS) 21 mg/24hr patch Place 1 patch (21 mg total) onto the skin daily. 10/16/23   Jadapalle, Sree, MD  pantoprazole  (PROTONIX ) 40 MG tablet TAKE ONE TABLET (40MG  TOTAL) BY MOUTH ONCE DAILY. Patient taking differently: Take 40 mg by mouth daily. 01/16/21     rosuvastatin  (CRESTOR ) 5 MG tablet TAKE ONE TABLET (5MG  TOTAL) BY MOUTH ONCE DAILY. Patient not taking: Reported on 10/07/2023 11/24/22   Laurita Pillion, MD  venlafaxine  XR (EFFEXOR -XR) 150 MG 24 hr capsule Take 1 capsule (150 mg total) by mouth daily with breakfast. 10/16/23   Jadapalle, Sree, MD  citalopram  (CELEXA ) 20 MG tablet Take 1 tablet (20 mg total) by mouth daily. Patient taking differently: Take 20 mg by mouth at bedtime. 11/09/20 01/17/21  Iloabachie, Chioma E, NP    Physical Exam:  Vitals:    04/10/24 1146 04/10/24 1148  BP: 124/88   Pulse: 85   Resp: 18   Temp: 98.5 F (36.9 C)   TempSrc: Oral   SpO2: 96%   Weight:  84 kg  Height:  5' 10 (1.778 m)   Wt Readings from Last 3 Encounters:  04/10/24 84 kg  11/01/23 83.9 kg  10/08/23 81.6 kg   Body mass index is 26.57 kg/m.  General:  Average built, not in obvious distress, somnolent HENT:  Normocephalic, No scleral pallor or icterus noted. Oral mucosa is moist.  Chest: Diminished breath sounds bilateral no crackles or wheezes.  CVS: S1 &S2 heard. No murmur.  Regular rate and rhythm. Abdomen: Soft, nontender, nondistended.  Bowel sounds are heard. No abdominal mass palpated Extremities: No cyanosis, clubbing, right below-knee amputation.SABRA Psych: Somnolent but mildly Communicative. CNS: Moving extremities.  Somnolent Skin: Warm and dry.   Labs on Admission:   CBC: Recent Labs  Lab 04/10/24 1153  WBC 11.0*  NEUTROABS 8.0*  HGB 15.3  HCT 44.0  MCV 89.4  PLT 260    Basic Metabolic Panel: Recent Labs  Lab 04/10/24 1153  NA 136  K 3.2*  CL 89*  CO2 30  GLUCOSE 206*  BUN 56*  CREATININE 1.47*  CALCIUM  12.5*    Liver Function Tests: Recent Labs  Lab 04/10/24 1153  AST 92*  ALT 35  ALKPHOS 80  BILITOT 1.3*  PROT 7.8  ALBUMIN 3.7   No results for input(s): LIPASE, AMYLASE in the last 168 hours. No results for input(s): AMMONIA in the last 168 hours.  Cardiac Enzymes: Recent Labs  Lab 04/10/24 1153  CKTOTAL 2,327*    BNP (last 3 results) No results for input(s): BNP in the last 8760 hours.  ProBNP (last 3 results) No results for input(s): PROBNP in the last 8760 hours.  CBG: No results for input(s): GLUCAP in the last 168 hours.  Lipase  No results found for: LIPASE   Urinalysis    Component Value Date/Time   COLORURINE STRAW (A) 10/07/2023 0225   APPEARANCEUR CLEAR (A) 10/07/2023 0225   APPEARANCEUR Cloudy (A) 01/13/2020 1156   LABSPEC 1.008 10/07/2023 0225    PHURINE 5.0 10/07/2023 0225   GLUCOSEU >=500 (A) 10/07/2023 0225   HGBUR NEGATIVE 10/07/2023 0225   BILIRUBINUR NEGATIVE 10/07/2023 0225   BILIRUBINUR Negative 01/13/2020 1156   KETONESUR NEGATIVE 10/07/2023 0225   PROTEINUR NEGATIVE 10/07/2023 0225   NITRITE NEGATIVE 10/07/2023 0225   LEUKOCYTESUR NEGATIVE 10/07/2023 0225     Drugs of Abuse     Component Value Date/Time   LABOPIA NONE DETECTED 10/07/2023 0225   LABOPIA NONE DETECTED 12/18/2020 1023   COCAINSCRNUR NONE DETECTED 10/07/2023 0225   LABBENZ NONE DETECTED 10/07/2023 0225   LABBENZ NONE DETECTED 12/18/2020 1023   AMPHETMU NONE DETECTED 10/07/2023 0225   AMPHETMU NONE DETECTED 12/18/2020 1023   THCU NONE DETECTED 10/07/2023 0225   THCU NONE DETECTED 12/18/2020 1023   LABBARB NONE DETECTED 10/07/2023 0225   LABBARB NONE DETECTED 12/18/2020 1023      Radiological Exams on Admission: DG Chest Port 1 View Result Date: 04/10/2024 EXAM: 1 VIEW(S) XRAY OF THE CHEST 04/10/2024 12:06:00 PM COMPARISON: None available. CLINICAL HISTORY: Pt BIB [facility] from home and was found in the floor by a bystander with AMS. Pt was last seen 2 days previously. Unknown how long pt was in the floor. Pt with hx of ETOH use and EMS noted empty bottles lying about the scene. Pt with an odor of stale ; urine. FINDINGS: LUNGS AND PLEURA: No focal pulmonary opacity. No pulmonary edema. No pleural effusion. No pneumothorax. HEART AND MEDIASTINUM: Aortic arch calcifications. BONES AND SOFT TISSUES: Healed left 4th and 5th rib fracture deformities. IMPRESSION: 1. No acute cardiopulmonary process. Electronically signed by: Ryan Chess MD 04/10/2024 12:34 PM EDT RP Workstation: [id number]   CT Head Wo Contrast Result Date: 04/10/2024 EXAM: CT HEAD WITHOUT CONTRAST 04/10/2024 12:22:22 PM TECHNIQUE: CT of the head was performed without the  administration of intravenous contrast. Automated exposure control, iterative reconstruction, and/or weight based  adjustment of the mA/kV was utilized to reduce the radiation dose to as low as reasonably achievable. COMPARISON: 11/01/2023 CLINICAL HISTORY: Mental status change, unknown cause. Mental status change. FINDINGS: BRAIN AND VENTRICLES: Remote left PCA territory infarct. Mild generalized volume loss and chronic small vessel disease. No acute hemorrhage. No evidence of acute infarct. No hydrocephalus. No extra-axial collection. No mass effect or midline shift. ORBITS: No acute abnormality. SINUSES: No acute abnormality. SOFT TISSUES AND SKULL: No acute soft tissue abnormality. No skull fracture. IMPRESSION: 1. No acute intracranial abnormality. 2. Remote left PCA territory infarct. Electronically signed by: Ryan Chess MD 04/10/2024 12:33 PM EDT RP Workstation: [id number]    EKG: Not available for review.   Consultant: None  Code Status: Full code  Microbiology none  Antibiotics: None  Family Communication: Spoke with the patient's good friend of many years Ms [name] Benders and updated her about the clinical condition of the patient.  Patient's friend has not spoken with the patient in 2 weeks or so.  Seems like patient does not have any family members.  The friend states that patient would benefit from living at assisted living facility.  Will consult TOC.  Status is: Inpatient   Severity of Illness: The appropriate patient status for this patient is INPATIENT. Inpatient status is judged to be reasonable and necessary in order to provide the required intensity of service to ensure the patient's safety. The patient's presenting symptoms, physical exam findings, and initial radiographic and laboratory data in the context of their chronic comorbidities is felt to place them at high risk for further clinical deterioration. Furthermore, it is not anticipated that the patient will be medically stable for discharge from the hospital within 2 midnights of admission.   * I certify that at the point  of admission it is my clinical judgment that the patient will require inpatient hospital care spanning beyond 2 midnights from the point of admission due to high intensity of service, high risk for further deterioration and high frequency of surveillance required.*  Signed, Vernal Alstrom, MD [facility] 04/10/2024

## 2024-04-10 NOTE — Discharge Instructions (Addendum)
 Residential Substance Use Treatment Services   [facility]  [address]  [place], [place] [address]  [phone] or [phone] Detox (Medicare, Medicaid, private insurance, and self pay)  Residential Rehab 14 days (Medicare, [place], private insurance, and self pay)   [facility]  [address] [place], [place]  [phone]  Male and Male Detox (Self Pay and Medicaid limited availability)  Rehab only Male ([place] and self pay only)   [facility] [address]      [address]  [place], [place] [address]  [phone] or [phone] Detox and Residential Treatment Private Insurance Only   [facility]  [address] [place]  [place], [place] [address]  [phone]  Treatment Only, must make assessment appointment, and must be sober for assessment appointment.  Self Pay Only, Medicare A&B, [facility], [place] ID only! *Transportation assistance offered from [employer]     [address] [place], [place] [address] Walk in interviews M-Sat 8-4p No pending legal charges [phone]     ADATC:  [facility] Referral  [address] [place], [place] [phone] (Self Pay, [facility])  [facility] [address] [place], [place] [address] [phone] Detox and Residential Treatment Medicare and Private Insurance  [facility] [address]  [place], [place] [address] 28 Day Women's Facility: [phone] 28 Day Men's Facility: [phone] Long-term Residential Program:  [phone] Males 25 and Over (No Insurance, upfront fee)  [facility]  [address] [place], [place] [address] [phone] Private Insurance with [employer], Private Pay  [facility] [address] [place], [place] [address] Local [phone] Private Insurance Only  [facility] [address]  [place], [place] [address]  [phone] (Males, upfront fee)  [facility] [address]  [place], [address] [phone] Private Insurance    [name]: To access our services, there are just 3 easy  steps: 1) Walk in any Monday, Wednesday or Friday  between 8:00 am and 3:00 pm and complete  our consumer paperwork 2) A Comprehensive Clinical Assessment (CCA)  will be completed and appropriate service  recommendations will be provided 3) Recommendations are sent to [facility] team  members and the appropriate staff will call  you within days. If you have any questions or concerns at any  time, please call us : [name] [address] [place], [place] [address] Phone: [phone]

## 2024-04-10 NOTE — ED Triage Notes (Addendum)
 Pt BIB [facility] from home and was found in the floor by a bystander with AMS. Pt was last seen 2 days previously. Unknown how long pt was in the floor. Pt with hx of ETOH use and EMS noted empty bottles lying about the scene. Pt with an odor of stale urine.   EMS Vitals  140/90 HR 80 SpO2 97% CBG 222

## 2024-04-10 NOTE — TOC Initial Note (Addendum)
 Transition of Care (TOC) - Initial/Assessment Note    Patient Details  Name: Timothy [name]. MRN: [id number] Date of Birth: 17-Dec-1960  Transition of Care [facility]) CM/SW Contact:    [place] L Clovis Warwick, LCSW Phone Number: 04/10/2024, 4:03 PM  Clinical Narrative:                    [facility] consult received for Home Health/DME needs and substance abuse education/counseling. TOC does not provided substance abuse education/counseling. Resources added to the AVS.   Home Health/DME needs will be assessed by physical and occupational therapy.   Consult advised that patient friend thinks that patient should go to ALF. TOC will discuss with patient as patient has decision making capacity unless otherwise determined by a psychiatric evaluation.      Patient Goals and CMS Choice            Expected Discharge Plan and Services                                              Prior Living Arrangements/Services                       Activities of Daily Living      Permission Sought/Granted                  Emotional Assessment              Admission diagnosis:  Rhabdomyolysis [M62.82] Patient Active Problem List   Diagnosis Date Noted   Rhabdomyolysis 04/10/2024   Tobacco use 10/08/2023   MDD (major depressive disorder), recurrent episode, severe (HCC) 10/08/2023   Acute CVA (cerebrovascular accident) (HCC) 10/07/2023   Reactive thrombocytosis 11/16/2022   Encephalopathy acute 11/09/2022   Pressure injury of skin 11/03/2022   History of DVT (deep vein thrombosis) 11/03/2022   Diabetic foot infection (HCC) 11/02/2022   Depression 11/02/2022   Dyslipidemia (high LDL; low HDL) 11/02/2022   Sepsis (HCC) 11/02/2022   PAD (peripheral artery disease) 11/02/2022   Osteomyelitis of right foot (HCC) 11/02/2022   Hyperlipidemia, mixed 10/02/2022   Fall 06/27/2022   Bipolar disorder with psychotic features (HCC) 05/28/2022   Generalized anxiety disorder  05/08/2022   Facial abscess 08/08/2021   Facial cellulitis 08/08/2021   Uncontrolled type 2 diabetes mellitus with hyperglycemia (HCC) 08/08/2021   COPD (chronic obstructive pulmonary disease) (HCC) 08/08/2021   Atherosclerosis of native arteries of the extremities with ulceration (HCC) 04/18/2021   DJD (degenerative joint disease), lumbosacral 04/18/2021   Leg pain 01/23/2021   Acute deep vein thrombosis (DVT) of left lower extremity (HCC) 01/23/2021   Atherosclerosis of native arteries of extremity with intermittent claudication 01/22/2021   MDD (major depressive disorder), recurrent severe, without psychosis (HCC) 12/19/2020   Severe episode of recurrent major depressive disorder, with psychotic features (HCC)    COPD with acute exacerbation (HCC) 12/14/2020   Hx of sleep apnea 12/14/2020   Empyema lung (HCC) 08/15/2020   Type 2 diabetes mellitus with hyperglycemia (HCC) 08/11/2020   Respiratory failure with hypoxia (HCC) 08/11/2020   Hyponatremia 08/11/2020   Chronic diastolic CHF (congestive heart failure) (HCC) 08/11/2020   Anemia 08/11/2020   Abdominal pain 08/11/2020   Empyema (HCC) 08/10/2020   Osteomyelitis (HCC) 03/15/2020   Open wound of foot 01/20/2020   Elevated lipids 01/20/2020   History of colonic  polyps    Shortness of breath 11/04/2019   Encounter to establish care 10/15/2019   Hypertension 10/15/2019   Diabetes mellitus without complication (HCC) 10/15/2019   Current smoker 10/15/2019   Peripheral neuropathy 10/15/2019   Cramp, abdominal 10/15/2019   History of hepatitis 10/15/2019   History of gastroesophageal reflux (GERD) 10/15/2019   Type 2 diabetes mellitus with diabetic neuropathy, unspecified (HCC) 10/15/2019   Post-traumatic stress disorder, unspecified 07/14/2019   Antisocial personality disorder (HCC) 07/14/2019   PCP:  Glover Lenis, MD Pharmacy:   [id number] [place], [place] - [address] [place]  [address] Phone: [phone] Fax: [phone]     Social Drivers of Health (SDOH) Social History: SDOH Screenings   Food Insecurity: No Food Insecurity (10/08/2023)  Housing: Low Risk  (10/08/2023)  Transportation Needs: No Transportation Needs (10/08/2023)  Utilities: Not At Risk (10/08/2023)  Alcohol Screen: Low Risk  (10/08/2023)  Depression (PHQ2-9): High Risk (10/03/2022)  Financial Resource Strain: Medium Risk (05/28/2022)  Physical Activity: Inactive (05/28/2022)  Social Connections: Moderately Isolated (10/07/2023)  Stress: Stress Concern Present (05/28/2022)  Tobacco Use: High Risk (04/10/2024)   SDOH Interventions:     Readmission Risk Interventions    11/06/2022   12:06 PM  Readmission Risk Prevention Plan  Transportation Screening Complete  PCP or Specialist Appt within 3-5 Days Complete  Social Work Consult for Recovery Care Planning/Counseling Complete  Palliative Care Screening Not Applicable  Medication Review Oceanographer) Referral to Pharmacy

## 2024-04-10 NOTE — Hospital Course (Addendum)
 Patient is a 63 year old male with past medical history of heavy alcohol abuse, CVA, hypertension, hyperlipidemia, COPD, major depression, bipolar disorder, anxiety was brought into the hospital by EMS after he was found to be in the floor at his home. Someone came to check on him at his house and was noted to be on the floor and had altered mental status. There was no mention of how long he was on the floor. He was last seen 2 days prior to this presentation. EMS noted that he had multiple empty alcohol bottles on his floor

## 2024-04-11 DIAGNOSIS — M6282 Rhabdomyolysis: Secondary | ICD-10-CM | POA: Diagnosis not present

## 2024-04-11 LAB — GLUCOSE, CAPILLARY
Glucose-Capillary: 128 mg/dL — ABNORMAL HIGH (ref 70–99)
Glucose-Capillary: 152 mg/dL — ABNORMAL HIGH (ref 70–99)
Glucose-Capillary: 95 mg/dL (ref 70–99)
Glucose-Capillary: 102 mg/dL — ABNORMAL HIGH (ref 70–99)

## 2024-04-11 LAB — COMPREHENSIVE METABOLIC PANEL WITH GFR
ALT: 28 U/L (ref 0–44)
AST: 56 U/L — ABNORMAL HIGH (ref 15–41)
Albumin: 3.2 g/dL — ABNORMAL LOW (ref 3.5–5.0)
Alkaline Phosphatase: 67 U/L (ref 38–126)
Anion gap: 16 — ABNORMAL HIGH (ref 5–15)
BUN: 52 mg/dL — ABNORMAL HIGH (ref 8–23)
CO2: 32 mmol/L (ref 22–32)
Calcium: 11.1 mg/dL — ABNORMAL HIGH (ref 8.9–10.3)
Chloride: 94 mmol/L — ABNORMAL LOW (ref 98–111)
Creatinine, Ser: 1.37 mg/dL — ABNORMAL HIGH (ref 0.61–1.24)
GFR, Estimated: 58 mL/min — ABNORMAL LOW (ref >60)
Glucose, Bld: 143 mg/dL — ABNORMAL HIGH (ref 70–99)
Potassium: 2.9 mmol/L — ABNORMAL LOW (ref 3.5–5.1)
Sodium: 142 mmol/L (ref 135–145)
Total Bilirubin: 1.3 mg/dL — ABNORMAL HIGH (ref 0.0–1.2)
Total Protein: 6.9 g/dL (ref 6.5–8.1)

## 2024-04-11 LAB — CBC
HCT: 39.5 % (ref 39.0–52.0)
Hemoglobin: 13.6 g/dL (ref 13.0–17.0)
MCH: 31.6 pg (ref 26.0–34.0)
MCHC: 34.4 g/dL (ref 30.0–36.0)
MCV: 91.6 fL (ref 80.0–100.0)
Platelets: 221 K/uL (ref 150–400)
RBC: 4.31 MIL/uL (ref 4.22–5.81)
RDW: 12.7 % (ref 11.5–15.5)
WBC: 9.3 K/uL (ref 4.0–10.5)
nRBC: 0 % (ref 0.0–0.2)

## 2024-04-11 LAB — PHOSPHORUS: Phosphorus: 2.6 mg/dL (ref 2.5–4.6)

## 2024-04-11 LAB — CK: Total CK: 875 U/L — ABNORMAL HIGH (ref 49–397)

## 2024-04-11 LAB — MAGNESIUM: Magnesium: 1.3 mg/dL — ABNORMAL LOW (ref 1.7–2.4)

## 2024-04-11 LAB — VITAMIN B12: Vitamin B-12: 1353 pg/mL — ABNORMAL HIGH (ref 180–914)

## 2024-04-11 LAB — CBG MONITORING, ED: Glucose-Capillary: 114 mg/dL — ABNORMAL HIGH (ref 70–99)

## 2024-04-11 MED ORDER — MAGNESIUM SULFATE 4 GM/100ML IV SOLN
4.0000 g | Freq: Once | INTRAVENOUS | Status: AC
  Administered 2024-04-11: 4 g via INTRAVENOUS
  Filled 2024-04-11: qty 100

## 2024-04-11 MED ORDER — ENOXAPARIN SODIUM 40 MG/0.4ML IJ SOSY
40.0000 mg | PREFILLED_SYRINGE | INTRAMUSCULAR | Status: DC
  Administered 2024-04-11 – 2024-04-21 (×11): 40 mg via SUBCUTANEOUS
  Filled 2024-04-11 (×11): qty 0.4

## 2024-04-11 MED ORDER — SODIUM CHLORIDE 0.9 % IV SOLN: 250.0000 mL | INTRAVENOUS | Status: AC | PRN

## 2024-04-11 MED ORDER — POTASSIUM CHLORIDE 10 MEQ/100ML IV SOLN
10.0000 meq | INTRAVENOUS | Status: AC
  Administered 2024-04-11 (×6): 10 meq via INTRAVENOUS
  Filled 2024-04-11 (×6): qty 100

## 2024-04-11 MED ORDER — SODIUM CHLORIDE 0.9 % IV SOLN: INTRAVENOUS | Status: AC

## 2024-04-11 MED ORDER — INSULIN GLARGINE-YFGN 100 UNIT/ML ~~LOC~~ SOLN
10.0000 [IU] | Freq: Every day | SUBCUTANEOUS | Status: DC
  Administered 2024-04-11: 10 [IU] via SUBCUTANEOUS
  Filled 2024-04-11 (×2): qty 0.1

## 2024-04-11 NOTE — ED Notes (Signed)
 This RN and Morna, NT cleaned patient up at this time after an episode of urinary incontinence. Brief changed, patient pulled up and repositioned in bed. Bed is low, locked, and call bell in reach. Fall bundle in place at this time.

## 2024-04-11 NOTE — Evaluation (Signed)
 Occupational Therapy Evaluation Patient Details Name: Timothy [name]. MRN: [id number] DOB: 04-14-1961 Today's Date: 04/11/2024   History of Present Illness   Pt is a 63 y/o male who presented to ED on 04/10/24 after being found on floor by a bystander with AMS. PMH significant for R BKA, tobacco use, type 2 diabetes, HTN, HLD, COPD, PTSD, bipolar disorder, anxiety, CVA     Clinical Impressions Timothy [name] was seen for OT/PT co-evaluation this date. Pt presents pleasantly confused. Oriented to self and place only, unreliable historian. Per chart, pt was living with a roommate 6 months prior to hospital admission. Multiple falls (6+) in the last year. Pt reports he was generally independent with ADL management, does endorse having some falls. No caregivers at bedside to confirm. Pt presents with deficits in strength, cognition, balance, safety awareness, and activity tolerance, affecting safe and optimal ADL completion. Pt currently requires MIN A +2 for bed mobility, MOD A +2 for STS t/f attempt and to perform lateral scooting toward HOB. MAX A to don hospital sock on LLE 2/2 BUE tremors. Pt would benefit from skilled OT services to address noted impairments and functional limitations (see below for any additional details) in order to maximize safety and independence while minimizing future risk of falls, injury, and readmission. Anticipate the need for follow up OT services upon acute hospital DC.      If plan is discharge home, recommend the following:   Two people to help with walking and/or transfers;A lot of help with bathing/dressing/bathroom;Assistance with cooking/housework;Assist for transportation;Help with stairs or ramp for entrance     Functional Status Assessment   Patient has had a recent decline in their functional status and demonstrates the ability to make significant improvements in function in a reasonable and predictable amount of time.     Equipment  Recommendations   Other (comment) (defer)     Recommendations for Other Services         Precautions/Restrictions   Precautions Precautions: Fall Recall of Precautions/Restrictions: Impaired Restrictions Weight Bearing Restrictions Per Provider Order: No Other Position/Activity Restrictions: R BKA does not use a prosthetic     Mobility Bed Mobility Overal bed mobility: Needs Assistance Bed Mobility: Supine to Sit, Sit to Supine     Supine to sit: Min assist, +2 for physical assistance Sit to supine: Min assist, +2 for physical assistance        Transfers Overall transfer level: Needs assistance Equipment used: Rolling walker (2 wheels) Transfers: Sit to/from Stand Sit to Stand: Mod assist, +2 safety/equipment                  Balance Overall balance assessment: Needs assistance Sitting-balance support: Feet supported, No upper extremity supported Sitting balance-Leahy Scale: Fair     Standing balance support: Reliant on assistive device for balance, During functional activity, Bilateral upper extremity supported Standing balance-Leahy Scale: Poor Standing balance comment: Requires assist to maintain standing balance at EOB.                           ADL either performed or assessed with clinical judgement   ADL Overall ADL's : Needs assistance/impaired                                       General ADL Comments: MIN A +2 for sup<>sit, STS. MOD A +2 to  perform lateral scoot toward HOB. MAX A to don sock on LLE.     Vision Patient Visual Report: No change from baseline       Perception         Praxis         Pertinent Vitals/Pain Pain Assessment Pain Assessment: Faces Faces Pain Scale: Hurts little more Pain Location: LLE Pain Descriptors / Indicators: Aching, Sore Pain Intervention(s): Limited activity within patient's tolerance, Monitored during session, Repositioned     Extremity/Trunk Assessment Upper  Extremity Assessment Upper Extremity Assessment: Generalized weakness   Lower Extremity Assessment Lower Extremity Assessment: Generalized weakness       Communication Communication Communication: No apparent difficulties   Cognition Arousal: Alert Behavior During Therapy: WFL for tasks assessed/performed Cognition: Cognition impaired, No family/caregiver present to determine baseline   Orientation impairments: Time, Situation   Memory impairment (select all impairments): Working civil service fast streamer, Copywriter, advertising, Engineer, structural memory Attention impairment (select first level of impairment): Sustained attention Executive functioning impairment (select all impairments): Organization, Sequencing, Reasoning, Problem solving                   Following commands: Impaired Following commands impaired: Follows one step commands inconsistently     Cueing  General Comments   Cueing Techniques: Verbal cues;Gestural cues;Tactile cues  Pt on 2L [place] at start of session, spO2 of 97%, per pt does not use home O2. O2 decreased to1 L and pt sustains spO2 of >/= 97% with activity. RN In room, agreeable to leaving pt on RA for therapy session.   Exercises Other Exercises Other Exercises: Pt educated on role of OT in acute setting, safety, falls prevention for home and hospital, and safe transfer technique.   Shoulder Instructions      Home Living Family/patient expects to be discharged to:: Private residence Living Arrangements: Non-relatives/Friends Available Help at Discharge: Friend(s) Type of Home: House Home Access: Ramped entrance Entrance Stairs-Number of Steps: (P) 3 STE   Home Layout: One level     Bathroom Shower/Tub: Chief Strategy Officer: Standard     Home Equipment: Grab bars - toilet;Grab bars - tub/shower;Wheelchair - manual   Additional Comments: Pt is unreliable historian 2/2 impaired cognition. Info regarding PLOF/home set up  obtained from chart review. Per chart, WC in poor condition, borred, and not well fitted to pt.      Prior Functioning/Environment Prior Level of Function : History of Falls (last six months)             Mobility Comments: Pt uses WC at baseline, states he pivots to/from seated surfaces. ADLs Comments: Pt reports he is independent, having many falls PTA per chart.    OT Problem List: Decreased strength;Decreased coordination;Decreased range of motion;Decreased activity tolerance;Impaired balance (sitting and/or standing);Decreased safety awareness;Decreased knowledge of use of DME or AE;Decreased cognition   OT Treatment/Interventions: Self-care/ADL training;Therapeutic exercise;Therapeutic activities;DME and/or AE instruction;Patient/family education;Cognitive remediation/compensation;Energy conservation;Balance training      OT Goals(Current goals can be found in the care plan section)   Acute Rehab OT Goals Patient Stated Goal: To get stronger OT Goal Formulation: With patient Time For Goal Achievement: 04/25/24 Potential to Achieve Goals: Good ADL Goals Pt Will Perform Grooming: sitting;with set-up;with supervision Pt Will Perform Lower Body Dressing: sitting/lateral leans;with supervision;with set-up;with adaptive equipment Pt Will Transfer to Toilet: squat pivot transfer;bedside commode;with supervision;with set-up Pt Will Perform Toileting - Clothing Manipulation and hygiene: with set-up;with supervision;with adaptive equipment;sitting/lateral leans   OT Frequency:  Min 2X/week    Co-evaluation PT/OT/SLP Co-Evaluation/Treatment: Yes Reason for Co-Treatment: Complexity of the patient's impairments (multi-system involvement);To address functional/ADL transfers;For patient/therapist safety;Necessary to address cognition/behavior during functional activity PT goals addressed during session: Mobility/safety with mobility;Balance;Proper use of DME OT goals addressed during  session: ADL's and self-care;Proper use of Adaptive equipment and DME      AM-PAC OT 6 Clicks Daily Activity     Outcome Measure Help from another person eating meals?: A Little Help from another person taking care of personal grooming?: A Little Help from another person toileting, which includes using toliet, bedpan, or urinal?: A Lot Help from another person bathing (including washing, rinsing, drying)?: A Lot Help from another person to put on and taking off regular upper body clothing?: A Little Help from another person to put on and taking off regular lower body clothing?: A Lot 6 Click Score: 15   End of Session Equipment Utilized During Treatment: Rolling walker (2 wheels) Nurse Communication: Mobility status;Other (comment) (cognition)  Activity Tolerance: Patient limited by pain Patient left: in bed;with call bell/phone within reach;with bed alarm set  OT Visit Diagnosis: Other abnormalities of gait and mobility (R26.89);Muscle weakness (generalized) (M62.81);Other symptoms and signs involving cognitive function                Time: 0940-1008 OT Time Calculation (min): 28 min Charges:  OT General Charges $OT Visit: 1 Visit OT Evaluation $OT Eval Moderate Complexity: 1 Mod OT Treatments $Self Care/Home Management : 8-22 mins  Jhonny Pelton, M.S., OTR/L 04/11/24, 12:34 PM

## 2024-04-11 NOTE — Evaluation (Signed)
 Physical Therapy Evaluation Patient Details Name: Timothy [name]. MRN: [id number] DOB: [birth year] Today's Date: 04/11/2024  History of Present Illness  Pt is a 63 y/o male who presented to ED on 04/10/24 after being found on floor by a bystander with AMS. PMH significant for R BKA, tobacco use, type 2 diabetes, HTN, HLD, COPD, PTSD, bipolar disorder, anxiety, CVA  Clinical Impression  Patient admitted with the above. Patient not reliable historian this date. Per chart review, patient lives with roommate and had multiple falls over the past year. He reports he is generally independent with transfers and mostly wheelchair bound. No family/friends present to confirm home setup and cognitive status. Required minA+2 for bed mobility and modA+2 for sit to stand transfer with RW. Unable to transfer to recliner this date. Patient will benefit from skilled PT services during acute stay to address listed deficits. Patient will benefit from ongoing therapy at discharge to maximize functional independence and safety.         If plan is discharge home, recommend the following: A lot of help with walking and/or transfers;A lot of help with bathing/dressing/bathroom;Assistance with cooking/housework;Direct supervision/assist for financial management;Direct supervision/assist for medications management;Assist for transportation;Help with stairs or ramp for entrance;Supervision due to cognitive status   Can travel by private vehicle        Equipment Recommendations Other (comment) (TBD)  Recommendations for Other Services       Functional Status Assessment Patient has had a recent decline in their functional status and demonstrates the ability to make significant improvements in function in a reasonable and predictable amount of time.     Precautions / Restrictions Precautions Precautions: Fall Recall of Precautions/Restrictions: Impaired Restrictions Weight Bearing Restrictions Per Provider Order:  No Other Position/Activity Restrictions: R BKA does not use a prosthetic      Mobility  Bed Mobility Overal bed mobility: Needs Assistance Bed Mobility: Supine to Sit, Sit to Supine     Supine to sit: Min assist, +2 for physical assistance Sit to supine: Min assist, +2 for physical assistance        Transfers Overall transfer level: Needs assistance Equipment used: Rolling Relena Ivancic (2 wheels) Transfers: Sit to/from Stand Sit to Stand: Mod assist, +2 safety/equipment           General transfer comment: required +2 to stand but unable to transfer to recliner    Ambulation/Gait                  Stairs            Wheelchair Mobility     Tilt Bed    Modified Rankin (Stroke Patients Only)       Balance Overall balance assessment: Needs assistance Sitting-balance support: Feet supported, No upper extremity supported Sitting balance-Leahy Scale: Fair     Standing balance support: Reliant on assistive device for balance, During functional activity, Bilateral upper extremity supported Standing balance-Leahy Scale: Poor Standing balance comment: Requires assist to maintain standing balance at EOB.                             Pertinent Vitals/Pain Pain Assessment Pain Assessment: Faces Faces Pain Scale: Hurts little more Pain Location: LLE Pain Descriptors / Indicators: Aching, Sore Pain Intervention(s): Limited activity within patient's tolerance, Monitored during session, Repositioned    Home Living Family/patient expects to be discharged to:: Private residence Living Arrangements: Non-relatives/Friends Available Help at Discharge: Friend(s) Type of Home: House  Home Access: Ramped entrance   Entrance Stairs-Number of Steps: 3 STE   Home Layout: One level Home Equipment: Grab bars - toilet;Grab bars - tub/shower;Wheelchair - manual Additional Comments: Pt is unreliable historian 2/2 impaired cognition. Info regarding PLOF/home set  up obtained from chart review. Per chart, WC in poor condition, borred, and not well fitted to pt.    Prior Function Prior Level of Function : History of Falls (last six months)             Mobility Comments: Pt uses WC at baseline, states he pivots to/from seated surfaces. ADLs Comments: Pt reports he is independent, having many falls PTA per chart.     Extremity/Trunk Assessment   Upper Extremity Assessment Upper Extremity Assessment: Generalized weakness    Lower Extremity Assessment Lower Extremity Assessment: Generalized weakness       Communication   Communication Communication: No apparent difficulties    Cognition Arousal: Alert Behavior During Therapy: WFL for tasks assessed/performed   PT - Cognitive impairments: No family/caregiver present to determine baseline                         Following commands: Impaired Following commands impaired: Follows one step commands inconsistently     Cueing Cueing Techniques: Verbal cues, Gestural cues, Tactile cues     General Comments General comments (skin integrity, edema, etc.): Pt on 2L  at start of session, spO2 of 97%, per pt does not use home O2. O2 decreased to1 L and pt sustains spO2 of >/= 97% with activity. RN In room, agreeable to leaving pt on RA for therapy session.    Exercises     Assessment/Plan    PT Assessment Patient needs continued PT services  PT Problem List Decreased strength;Decreased activity tolerance;Decreased mobility;Decreased balance;Decreased cognition;Decreased knowledge of use of DME;Decreased safety awareness;Decreased knowledge of precautions;Decreased skin integrity;Pain       PT Treatment Interventions DME instruction;Functional mobility training;Therapeutic activities;Therapeutic exercise;Balance training;Neuromuscular re-education;Patient/family education    PT Goals (Current goals can be found in the Care Plan section)  Acute Rehab PT Goals Patient Stated  Goal: did not state PT Goal Formulation: With patient Time For Goal Achievement: 04/25/24 Potential to Achieve Goals: Fair    Frequency Min 2X/week     Co-evaluation PT/OT/SLP Co-Evaluation/Treatment: Yes Reason for Co-Treatment: Complexity of the patient's impairments (multi-system involvement);To address functional/ADL transfers;For patient/therapist safety;Necessary to address cognition/behavior during functional activity PT goals addressed during session: Mobility/safety with mobility;Balance;Proper use of DME OT goals addressed during session: ADL's and self-care;Proper use of Adaptive equipment and DME       AM-PAC PT 6 Clicks Mobility  Outcome Measure Help needed turning from your back to your side while in a flat bed without using bedrails?: A Little Help needed moving from lying on your back to sitting on the side of a flat bed without using bedrails?: A Little Help needed moving to and from a bed to a chair (including a wheelchair)?: Total Help needed standing up from a chair using your arms (e.g., wheelchair or bedside chair)?: Total Help needed to walk in hospital room?: Total Help needed climbing 3-5 steps with a railing? : Total 6 Click Score: 10    End of Session   Activity Tolerance: Patient tolerated treatment well Patient left: in bed;with call bell/phone within reach;with bed alarm set;with nursing/sitter in room Nurse Communication: Mobility status PT Visit Diagnosis: Unsteadiness on feet (R26.81);Muscle weakness (generalized) (M62.81);Other abnormalities of gait and  mobility (R26.89)    Time: 9058-8992 PT Time Calculation (min) (ACUTE ONLY): 26 min   Charges:   PT Evaluation $PT Eval Moderate Complexity: 1 Mod   PT General Charges $$ ACUTE PT VISIT: 1 Visit         Maryanne Finder, PT, DPT Physical Therapist - [facility]  [facility]   Placido Hangartner A Domonik Levario 04/11/2024, 1:13 PM

## 2024-04-11 NOTE — Progress Notes (Signed)
  PROGRESS NOTE    Timothy [name].  FMW:[id number] DOB: 1960/12/26 DOA: 04/10/2024 PCP: Glover Lenis, MD  246A/[id number]-AA  LOS: 1 day   Brief hospital course:   Assessment & Plan: Patient is a 63 year old male with past medical history of heavy alcohol abuse, CVA, hypertension, hyperlipidemia, COPD, major depression, bipolar disorder, anxiety was brought into the hospital by EMS after he was found to be in the floor at his home. Someone came to check on him at his house and was noted to be on the floor and had  altered mental status.  There was no mention of how long he was on the floor.  He was last seen 2 days prior to this presentation.  EMS noted that he had multiple empty alcohol bottles on his floor    Fall and found on the floor.     Likely secondary to ongoing alcohol usage. --PT/OT   Rhabdomyolysis.   --cont MIVF   Hypokalemia Hypomag --monitor and supplement PRN  Alcohol abuse with tremors on presentation.   --CIWA   Essential hypertension. --cont Toprol    History of CVA  --cont plavix    History of major depressive disorder, bipolar disorder/anxiety Supposed to be on Abilify  but not taking at home.  Will hold for now. --hold gabapentin  due to somnolence   Possible mild AKI.   --cont MIVF   History of COPD, Continue inhalers and bronchodilators   Diabetes mellitus type 2 with mild hyperglycemia.SABRA --reduce glargine to 10u nightly --ACHS and SSI   DVT prophylaxis: Lovenox  SQ Code Status: Full code  Family Communication:  Level of care: Telemetry Medical Dispo:   The patient is from: home Anticipated d/c is to: home Anticipated d/c date is: 2-3 days   Subjective and Interval History:  Pt was sleeping all day.     Objective: Vitals:   04/11/24 1231 04/11/24 1254 04/11/24 1726 04/11/24 1833  BP: 134/77 134/77 (!) 135/99 (!) 135/99  Pulse: 74 74 71 71  Resp:  15  15  Temp:  97.9 F (36.6 C)  98.2 F (36.8 C)  TempSrc:  Oral  Oral  SpO2:   97%  91%  Weight:      Height:        Intake/Output Summary (Last 24 hours) at 04/11/2024 1937 Last data filed at 04/11/2024 1700 Gross per 24 hour  Intake 1952.3 ml  Output 1750 ml  Net 202.3 ml   Filed Weights   04/10/24 1148  Weight: 84 kg    Examination:   Constitutional: NAD CV: No cyanosis.   RESP: normal respiratory effort, on RA Extremities: right BKA SKIN: warm, dry   Data Reviewed: I have personally reviewed labs and imaging studies  Time spent: 50 minutes  Timothy Haber, MD [facility] If 7PM-7AM, please contact night-coverage 04/11/2024, 7:37 PM

## 2024-04-11 NOTE — ED Notes (Signed)
 Assisted RN Aleck change pt brief and pull pt up in the bed.

## 2024-04-11 NOTE — Consult Note (Addendum)
 WOC Nurse Consult Note: Reason for Consult: requested to assess a injury on malleolus. Performed remote evaluation of photos and notes. Wound type: arterial? Classified as a PI. History of alcohol abuse, hypertension. Pressure Injury POA: Yes Measurement: round, aprox. 2 cm x 2 cm x 0.2 cm. Wound bed: 40% yellow, 60% pale red. Drainage (amount, consistency, odor) Minimum amount. Periwound: purple/brown skin. Dressing procedure/placement/frequency: Cleanse with Vashe [id number], not rinse, pat dry. Apply Xeroform daily to the wound bed, top with foam dressing. The foam can stay up to 3 days if not saturated or soiling. Of to lift and reapply the Xeroform.  WOC team will not plan to follow further. Please reconsult if further assistance is needed. Thank-you,  Lela Holm RN, CNS, [employer], MSN.  (Phone [phone])

## 2024-04-12 DIAGNOSIS — M6282 Rhabdomyolysis: Secondary | ICD-10-CM | POA: Diagnosis not present

## 2024-04-12 LAB — GLUCOSE, CAPILLARY
Glucose-Capillary: 112 mg/dL — ABNORMAL HIGH (ref 70–99)
Glucose-Capillary: 91 mg/dL (ref 70–99)
Glucose-Capillary: 98 mg/dL (ref 70–99)
Glucose-Capillary: 120 mg/dL — ABNORMAL HIGH (ref 70–99)

## 2024-04-12 LAB — MAGNESIUM: Magnesium: 1.9 mg/dL (ref 1.7–2.4)

## 2024-04-12 LAB — BASIC METABOLIC PANEL WITH GFR
Anion gap: 10 (ref 5–15)
BUN: 37 mg/dL — ABNORMAL HIGH (ref 8–23)
CO2: 27 mmol/L (ref 22–32)
Calcium: 9.2 mg/dL (ref 8.9–10.3)
Chloride: 104 mmol/L (ref 98–111)
Creatinine, Ser: 1.21 mg/dL (ref 0.61–1.24)
GFR, Estimated: >60 mL/min (ref >60)
Glucose, Bld: 86 mg/dL (ref 70–99)
Potassium: 2.9 mmol/L — ABNORMAL LOW (ref 3.5–5.1)
Sodium: 141 mmol/L (ref 135–145)

## 2024-04-12 LAB — PHOSPHORUS: Phosphorus: 2.2 mg/dL — ABNORMAL LOW (ref 2.5–4.6)

## 2024-04-12 MED ORDER — LORAZEPAM 2 MG PO TABS: 0.0000 mg | ORAL_TABLET | Freq: Four times a day (QID) | ORAL | Status: DC | PRN

## 2024-04-12 MED ORDER — LORAZEPAM 2 MG/ML IJ SOLN
INTRAMUSCULAR | Status: AC
  Administered 2024-04-12: 4 mg via INTRAVENOUS
  Filled 2024-04-12: qty 2

## 2024-04-12 MED ORDER — LORAZEPAM 2 MG/ML IJ SOLN: 0.0000 mg | Freq: Four times a day (QID) | INTRAMUSCULAR | Status: DC | PRN

## 2024-04-12 MED ORDER — LORAZEPAM 2 MG PO TABS
0.0000 mg | ORAL_TABLET | Freq: Four times a day (QID) | ORAL | Status: DC | PRN
  Administered 2024-04-14: 2 mg via ORAL
  Administered 2024-04-15 – 2024-04-17 (×2): 1 mg via ORAL
  Administered 2024-04-18: 2 mg via ORAL
  Administered 2024-04-19: 1 mg via ORAL
  Administered 2024-04-20: 2 mg via ORAL
  Filled 2024-04-12 (×6): qty 1

## 2024-04-12 MED ORDER — LORAZEPAM 2 MG/ML IJ SOLN
0.0000 mg | INTRAMUSCULAR | Status: DC | PRN
  Administered 2024-04-12: 3 mg via INTRAVENOUS
  Administered 2024-04-13 (×2): 4 mg via INTRAVENOUS
  Administered 2024-04-13: 2 mg via INTRAVENOUS
  Administered 2024-04-13: 4 mg via INTRAVENOUS
  Filled 2024-04-12: qty 1
  Filled 2024-04-12 (×4): qty 2

## 2024-04-12 MED ORDER — POTASSIUM PHOSPHATES 15 MMOLE/5ML IV SOLN
30.0000 mmol | Freq: Once | INTRAVENOUS | Status: AC
  Administered 2024-04-12: 30 mmol via INTRAVENOUS
  Filled 2024-04-12: qty 10

## 2024-04-12 MED ORDER — INSULIN GLARGINE-YFGN 100 UNIT/ML ~~LOC~~ SOLN
5.0000 [IU] | Freq: Every day | SUBCUTANEOUS | Status: DC
Start: 2024-04-12 — End: 2024-04-20
  Administered 2024-04-12 – 2024-04-19 (×7): 5 [IU] via SUBCUTANEOUS
  Filled 2024-04-12 (×9): qty 0.05

## 2024-04-12 NOTE — Progress Notes (Signed)
  PROGRESS NOTE    Timothy [name].  FMW:[id number] DOB: Feb 06, 1961 DOA: 04/10/2024 PCP: Glover Lenis, MD  203A/[id number]-AA  LOS: 2 days   Brief hospital course:   Assessment & Plan: Patient is a 63 year old male with past medical history of heavy alcohol abuse, CVA, hypertension, hyperlipidemia, COPD, major depression, bipolar disorder, anxiety was brought into the hospital by EMS after he was found to be in the floor at his home. Someone came to check on him at his house and was noted to be on the floor and had  altered mental status.  There was no mention of how long he was on the floor.  He was last seen 2 days prior to this presentation.  EMS noted that he had multiple empty alcohol bottles on his floor    Fall and found on the floor.     Likely secondary to ongoing alcohol usage.  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 --PT/OT   Rhabdomyolysis.   --cont MIVF   Hypokalemia Hypomag Hypophos --monitor and supplement PRN  Alcohol abuse with tremors on presentation.   --having withdrawal symptoms --cont CIWA   Essential hypertension. --cont Toprol    History of CVA  --cont plavix    History of major depressive disorder, bipolar disorder/anxiety Supposed to be on Abilify  but not taking at home.  Will hold for now. --hold gabapentin  due to somnolence   Possible mild AKI.   --cont MIVF   History of COPD, Continue inhalers and bronchodilators   Diabetes mellitus type 2 with mild hyperglycemia.SABRA --reduce glargine to 5u nightly --ACHS and SSI   DVT prophylaxis: Lovenox  SQ Code Status: Full code  Family Communication:  Level of care: Med-Surg Dispo:   The patient is from: home Anticipated d/c is to: SNF rehab Anticipated d/c date is: 2-3 days   Subjective and Interval History:  Pt reported feeling not well.  When asked what was not well, he said everything.   Objective: Vitals:   04/12/24 0556 04/12/24 0852 04/12/24 1136 04/12/24 1230   BP: (!) 140/76 126/77 122/72 (!) 143/86  Pulse: 63 65 (!) 56 72  Resp:    16  Temp:  97.7 F (36.5 C) 98.2 F (36.8 C) 97.9 F (36.6 C)  TempSrc:  Oral Oral Oral  SpO2:  96% 96% 97%  Weight:      Height:        Intake/Output Summary (Last 24 hours) at 04/12/2024 1608 Last data filed at 04/12/2024 1138 Gross per 24 hour  Intake 2624.71 ml  Output 1745 ml  Net 879.71 ml   Filed Weights   04/10/24 1148 04/12/24 0500  Weight: 84 kg 76.7 kg    Examination:   Constitutional: NAD, awake HEENT: conjunctivae and lids normal, EOMI CV: No cyanosis.   RESP: normal respiratory effort, on RA Extremities: right BKA   Data Reviewed: I have personally reviewed labs and imaging studies  Time spent: 35 minutes  Timothy Haber, MD [facility] If 7PM-7AM, please contact night-coverage 04/12/2024, 4:08 PM

## 2024-04-12 NOTE — Plan of Care (Signed)

## 2024-04-12 NOTE — Plan of Care (Signed)
  Problem: Fluid Volume: Goal: Ability to maintain a balanced intake and output will improve Outcome: Progressing   Problem: Metabolic: Goal: Ability to maintain appropriate glucose levels will improve Outcome: Progressing   Problem: Tissue Perfusion: Goal: Adequacy of tissue perfusion will improve Outcome: Progressing   Problem: Clinical Measurements: Goal: Will remain free from infection Outcome: Progressing Goal: Respiratory complications will improve Outcome: Progressing   Problem: Education: Goal: Ability to describe self-care measures that may prevent or decrease complications (Diabetes Survival Skills Education) will improve Outcome: Not Progressing Goal: Individualized Educational Video(s) Outcome: Not Progressing   Problem: Coping: Goal: Ability to adjust to condition or change in health will improve Outcome: Not Progressing   Problem: Health Behavior/Discharge Planning: Goal: Ability to identify and utilize available resources and services will improve Outcome: Not Progressing Goal: Ability to manage health-related needs will improve Outcome: Not Progressing   Problem: Nutritional: Goal: Maintenance of adequate nutrition will improve Outcome: Not Progressing Goal: Progress toward achieving an optimal weight will improve Outcome: Not Progressing   Problem: Skin Integrity: Goal: Risk for impaired skin integrity will decrease Outcome: Not Progressing   Problem: Education: Goal: Knowledge of General Education information will improve Description: Including pain rating scale, medication(s)/side effects and non-pharmacologic comfort measures Outcome: Not Progressing   Problem: Health Behavior/Discharge Planning: Goal: Ability to manage health-related needs will improve Outcome: Not Progressing   Problem: Clinical Measurements: Goal: Ability to maintain clinical measurements within normal limits will improve Outcome: Not Progressing Goal: Diagnostic test  results will improve Outcome: Not Progressing Goal: Cardiovascular complication will be avoided Outcome: Not Progressing   Problem: Activity: Goal: Risk for activity intolerance will decrease Outcome: Not Progressing   Problem: Nutrition: Goal: Adequate nutrition will be maintained Outcome: Not Progressing   Problem: Coping: Goal: Level of anxiety will decrease Outcome: Not Progressing   Problem: Elimination: Goal: Will not experience complications related to bowel motility Outcome: Not Progressing Goal: Will not experience complications related to urinary retention Outcome: Not Progressing   Problem: Pain Managment: Goal: General experience of comfort will improve and/or be controlled Outcome: Not Progressing   Problem: Safety: Goal: Ability to remain free from injury will improve Outcome: Not Progressing   Problem: Skin Integrity: Goal: Risk for impaired skin integrity will decrease Outcome: Not Progressing

## 2024-04-13 DIAGNOSIS — M6282 Rhabdomyolysis: Secondary | ICD-10-CM | POA: Diagnosis not present

## 2024-04-13 LAB — BASIC METABOLIC PANEL WITH GFR
Anion gap: 8 (ref 5–15)
BUN: 23 mg/dL (ref 8–23)
CO2: 25 mmol/L (ref 22–32)
Calcium: 8.7 mg/dL — ABNORMAL LOW (ref 8.9–10.3)
Chloride: 105 mmol/L (ref 98–111)
Creatinine, Ser: 1.19 mg/dL (ref 0.61–1.24)
GFR, Estimated: >60 mL/min (ref >60)
Glucose, Bld: 113 mg/dL — ABNORMAL HIGH (ref 70–99)
Potassium: 3.3 mmol/L — ABNORMAL LOW (ref 3.5–5.1)
Sodium: 138 mmol/L (ref 135–145)

## 2024-04-13 LAB — PHOSPHORUS: Phosphorus: 2.4 mg/dL — ABNORMAL LOW (ref 2.5–4.6)

## 2024-04-13 LAB — GLUCOSE, CAPILLARY
Glucose-Capillary: 108 mg/dL — ABNORMAL HIGH (ref 70–99)
Glucose-Capillary: 113 mg/dL — ABNORMAL HIGH (ref 70–99)
Glucose-Capillary: 127 mg/dL — ABNORMAL HIGH (ref 70–99)
Glucose-Capillary: 123 mg/dL — ABNORMAL HIGH (ref 70–99)

## 2024-04-13 LAB — MAGNESIUM: Magnesium: 1.6 mg/dL — ABNORMAL LOW (ref 1.7–2.4)

## 2024-04-13 MED ORDER — POTASSIUM PHOSPHATES 15 MMOLE/5ML IV SOLN
30.0000 mmol | Freq: Once | INTRAVENOUS | Status: AC
  Administered 2024-04-13: 30 mmol via INTRAVENOUS
  Filled 2024-04-13: qty 10

## 2024-04-13 MED ORDER — MAGNESIUM SULFATE 4 GM/100ML IV SOLN
4.0000 g | Freq: Once | INTRAVENOUS | Status: AC
  Administered 2024-04-13: 4 g via INTRAVENOUS
  Filled 2024-04-13: qty 100
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
## 2024-04-13 NOTE — Plan of Care (Signed)

## 2024-04-13 NOTE — Progress Notes (Signed)
 OT Cancellation Note  Patient Details Name: Timothy [name]. MRN: [id number] DOB: [birth year]   Cancelled Treatment:    Reason Eval/Treat Not Completed: Fatigue/lethargy limiting ability to participate. Chart reviewed, on arrival pt sleeping soundly. Per chart pt with ativan  administered following elevated CIWA score, pt not appropriate for therapy at this time. Will re-attempt as pt alert and able to participate.   Elston Slot, M.S. OTR/L  04/13/24, 4:12 PM  ascom [phone]

## 2024-04-13 NOTE — Progress Notes (Signed)
  PROGRESS NOTE    Timothy [name].  FMW:[id number] DOB: 1960-06-27 DOA: 04/10/2024 PCP: Glover Lenis, MD  203A/[id number]-AA  LOS: 3 days   Brief hospital course:   Assessment & Plan: Patient is a 63 year old male with past medical history of heavy alcohol abuse, CVA, hypertension, hyperlipidemia, COPD, major depression, bipolar disorder, anxiety was brought into the hospital by EMS after he was found to be in the floor at his home. Someone came to check on him at his house and was noted to be on the floor and had  altered mental status.  There was no mention of how long he was on the floor.  He was last seen 2 days prior to this presentation.  EMS noted that he had multiple empty alcohol bottles on his floor    Fall and found on the floor.     Likely secondary to ongoing alcohol usage.  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 --PT/OT   Rhabdomyolysis.   --s/p IVF   Hypokalemia Hypomag Hypophos --monitor and supplement PRN  Alcohol abuse with tremors on presentation.   --having withdrawal symptoms --cont CIWA with ativan  PRN   Essential hypertension. --cont Toprol    History of CVA  --cont plavix    History of major depressive disorder, bipolar disorder/anxiety Supposed to be on Abilify  but not taking at home.  Will hold for now. --hold gabapentin  due to somnolence   Possible mild AKI.   --s/p MIVF --oral hydration now   History of COPD, Continue inhalers and bronchodilators   Diabetes mellitus type 2 with mild hyperglycemia.. --A1c 8.1 --cont glargine 5u nightly --ACHS and SSI   DVT prophylaxis: Lovenox  SQ Code Status: Full code  Family Communication:  Level of care: Med-Surg Dispo:   The patient is from: home Anticipated d/c is to: SNF rehab Anticipated d/c date is: 2-3 days   Subjective and Interval History:  Pt had been calm on PRN ativan  today.   Objective: Vitals:   04/13/24 0106 04/13/24 0450 04/13/24 0500 04/13/24 0820   BP: (!) 157/75 (!) 157/79  125/77  Pulse: 65 67  70  Resp:  20    Temp:  97.7 F (36.5 C)  97.9 F (36.6 C)  TempSrc:  Oral  Oral  SpO2: 98% 97%  97%  Weight:   75.3 kg   Height:        Intake/Output Summary (Last 24 hours) at 04/13/2024 1848 Last data filed at 04/13/2024 1755 Gross per 24 hour  Intake 896.52 ml  Output 1900 ml  Net -1003.48 ml   Filed Weights   04/10/24 1148 04/12/24 0500 04/13/24 0500  Weight: 84 kg 76.7 kg 75.3 kg    Examination:   Constitutional: NAD CV: No cyanosis.   RESP: normal respiratory effort, on RA Extremities: right BKA SKIN: warm, dry   Data Reviewed: I have personally reviewed labs and imaging studies  Time spent: 35 minutes  Ellouise Haber, MD [facility] If 7PM-7AM, please contact night-coverage 04/13/2024, 6:48 PM

## 2024-04-13 NOTE — Plan of Care (Signed)
  Problem: Education: Goal: Ability to describe self-care measures that may prevent or decrease complications (Diabetes Survival Skills Education) will improve Outcome: Progressing   Problem: Education: Goal: Individualized Educational Video(s) Outcome: Progressing   Problem: Coping: Goal: Ability to adjust to condition or change in health will improve Outcome: Progressing   Problem: Fluid Volume: Goal: Ability to maintain a balanced intake and output will improve Outcome: Progressing   Problem: Health Behavior/Discharge Planning: Goal: Ability to identify and utilize available resources and services will improve Outcome: Progressing   Problem: Health Behavior/Discharge Planning: Goal: Ability to manage health-related needs will improve Outcome: Progressing   Problem: Nutritional: Goal: Maintenance of adequate nutrition will improve Outcome: Progressing   Problem: Nutritional: Goal: Progress toward achieving an optimal weight will improve Outcome: Progressing   Problem: Education: Goal: Knowledge of General Education information will improve Description: Including pain rating scale, medication(s)/side effects and non-pharmacologic comfort measures Outcome: Progressing   Problem: Health Behavior/Discharge Planning: Goal: Ability to manage health-related needs will improve Outcome: Progressing

## 2024-04-14 DIAGNOSIS — M6282 Rhabdomyolysis: Secondary | ICD-10-CM

## 2024-04-14 LAB — BASIC METABOLIC PANEL WITH GFR
Anion gap: 14 (ref 5–15)
BUN: 19 mg/dL (ref 8–23)
CO2: 23 mmol/L (ref 22–32)
Calcium: 8.3 mg/dL — ABNORMAL LOW (ref 8.9–10.3)
Chloride: 101 mmol/L (ref 98–111)
Creatinine, Ser: 1.27 mg/dL — ABNORMAL HIGH (ref 0.61–1.24)
GFR, Estimated: >60 mL/min (ref >60)
Glucose, Bld: 102 mg/dL — ABNORMAL HIGH (ref 70–99)
Potassium: 3.7 mmol/L (ref 3.5–5.1)
Sodium: 138 mmol/L (ref 135–145)

## 2024-04-14 LAB — GLUCOSE, CAPILLARY
Glucose-Capillary: 102 mg/dL — ABNORMAL HIGH (ref 70–99)
Glucose-Capillary: 98 mg/dL (ref 70–99)
Glucose-Capillary: 112 mg/dL — ABNORMAL HIGH (ref 70–99)
Glucose-Capillary: 228 mg/dL — ABNORMAL HIGH (ref 70–99)

## 2024-04-14 LAB — MAGNESIUM: Magnesium: 2.2 mg/dL (ref 1.7–2.4)

## 2024-04-14 LAB — PHOSPHORUS: Phosphorus: 3.2 mg/dL (ref 2.5–4.6)

## 2024-04-14 NOTE — Progress Notes (Addendum)
  PROGRESS NOTE    Timothy [name].  FMW:[id number] DOB: December 24, 1960 DOA: 04/10/2024 PCP: Timothy Lenis, MD  203A/[id number]-AA  LOS: 4 days   Brief hospital course:   Assessment & Plan: Patient is a 63 year old male with past medical history of heavy alcohol abuse, CVA, hypertension, hyperlipidemia, COPD, major depression, bipolar disorder, anxiety was brought into the hospital by EMS after he was found to be in the floor at his home. Someone came to check on him at his house and was noted to be on the floor and had  altered mental status.  There was no mention of how long he was on the floor.  He was last seen 2 days prior to this presentation.  EMS noted that he had multiple empty alcohol bottles on his floor    Fall and found on the floor.     Likely secondary to ongoing alcohol usage.  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 --PT/OT   Rhabdomyolysis.   --s/p IVF   Hypokalemia Hypomag Hypophos --monitor and supplement PRN  Alcohol abuse with tremors on presentation.   --having withdrawal symptoms, but more calm and alert today. --cont CIWA   Essential hypertension. --cont Toprol    History of CVA  --cont plavix    History of major depressive disorder, bipolar disorder/anxiety Supposed to be on Abilify  but not taking at home.  Will hold for now. --hold gabapentin  due to somnolence   Possible mild AKI.   --s/p MIVF --oral hydration now   History of COPD, Continue inhalers and bronchodilators   Diabetes mellitus type 2 with mild hyperglycemia.. --A1c 8.1 --cont glargine 5u nightly --ACHS and SSI  Pressure injury left ankle stage 3, present on admission    DVT prophylaxis: Lovenox  SQ Code Status: Full code  Family Communication:  Level of care: Med-Surg Dispo:   The patient is from: home Anticipated d/c is to: SNF rehab Anticipated d/c date is: whenever SNF accepts   Subjective and Interval History:  Pt was calm and alert today.  Pt  reported feeling better, and asked for calm down pills.     Objective: Vitals:   04/13/24 1923 04/14/24 0209 04/14/24 0414 04/14/24 1601  BP: (!) 159/92  136/87 126/73  Pulse: 77  74 78  Resp: 18  18 18   Temp: 97.9 F (36.6 C)  97.6 F (36.4 C) 99.1 F (37.3 C)  TempSrc:   Axillary   SpO2: 96%  97% 99%  Weight:  72.6 kg    Height:        Intake/Output Summary (Last 24 hours) at 04/14/2024 1903 Last data filed at 04/14/2024 1805 Gross per 24 hour  Intake 393.58 ml  Output 1100 ml  Net -706.42 ml   Filed Weights   04/12/24 0500 04/13/24 0500 04/14/24 0209  Weight: 76.7 kg 75.3 kg 72.6 kg    Examination:   Constitutional: NAD, alert, oriented HEENT: conjunctivae and lids normal, EOMI CV: No cyanosis.   RESP: normal respiratory effort, on RA Extremities: right BKA Neuro: II - XII grossly intact.     Data Reviewed: I have personally reviewed labs and imaging studies  Time spent: 25 minutes  Ellouise Haber, MD [facility] If 7PM-7AM, please contact night-coverage 04/14/2024, 7:03 PM

## 2024-04-14 NOTE — [place] FL2 (Signed)
 [place]  MEDICAID FL2 LEVEL OF CARE FORM     IDENTIFICATION  Patient Name: Timothy [name]. Birthdate: July 21, 1960 Sex: male Admission Date (Current Location): 04/10/2024  Welcome and [place] Number:  Chiropodist and Address:  [facility], [address], [place], [place] [address]      Provider Number: [id number]  Attending Physician Name and Address:  Awanda City, MD  Relative Name and Phone Number:       Current Level of Care: Hospital Recommended Level of Care: Skilled Nursing Facility Prior Approval Number:    Date Approved/Denied:   PASRR Number: pending  Discharge Plan: SNF    Current Diagnoses: Patient Active Problem List   Diagnosis Date Noted   Rhabdomyolysis 04/10/2024   Tobacco use 10/08/2023   MDD (major depressive disorder), recurrent episode, severe (HCC) 10/08/2023   Acute CVA (cerebrovascular accident) (HCC) 10/07/2023   Reactive thrombocytosis 11/16/2022   Encephalopathy acute 11/09/2022   Pressure injury of skin 11/03/2022   History of DVT (deep vein thrombosis) 11/03/2022   Diabetic foot infection (HCC) 11/02/2022   Depression 11/02/2022   Dyslipidemia (high LDL; low HDL) 11/02/2022   Sepsis (HCC) 11/02/2022   PAD (peripheral artery disease) 11/02/2022   Osteomyelitis of right foot (HCC) 11/02/2022   Hyperlipidemia, mixed 10/02/2022   Fall 06/27/2022   Bipolar disorder with psychotic features (HCC) 05/28/2022   Generalized anxiety disorder 05/08/2022   Facial abscess 08/08/2021   Facial cellulitis 08/08/2021   Uncontrolled type 2 diabetes mellitus with hyperglycemia (HCC) 08/08/2021   COPD (chronic obstructive pulmonary disease) (HCC) 08/08/2021   Atherosclerosis of native arteries of the extremities with ulceration (HCC) 04/18/2021   DJD (degenerative joint disease), lumbosacral 04/18/2021   Leg pain 01/23/2021   Acute deep vein thrombosis (DVT) of left lower extremity (HCC) 01/23/2021    Atherosclerosis of native arteries of extremity with intermittent claudication 01/22/2021   MDD (major depressive disorder), recurrent severe, without psychosis (HCC) 12/19/2020   Severe episode of recurrent major depressive disorder, with psychotic features (HCC)    COPD with acute exacerbation (HCC) 12/14/2020   Hx of sleep apnea 12/14/2020   Empyema lung (HCC) 08/15/2020   Type 2 diabetes mellitus with hyperglycemia (HCC) 08/11/2020   Respiratory failure with hypoxia (HCC) 08/11/2020   Hyponatremia 08/11/2020   Chronic diastolic CHF (congestive heart failure) (HCC) 08/11/2020   Anemia 08/11/2020   Abdominal pain 08/11/2020   Empyema (HCC) 08/10/2020   Osteomyelitis (HCC) 03/15/2020   Open wound of foot 01/20/2020   Elevated lipids 01/20/2020   History of colonic polyps    Shortness of breath 11/04/2019   Encounter to establish care 10/15/2019   Hypertension 10/15/2019   Diabetes mellitus without complication (HCC) 10/15/2019   Current smoker 10/15/2019   Peripheral neuropathy 10/15/2019   Cramp, abdominal 10/15/2019   History of hepatitis 10/15/2019   History of gastroesophageal reflux (GERD) 10/15/2019   Type 2 diabetes mellitus with diabetic neuropathy, unspecified (HCC) 10/15/2019   Post-traumatic stress disorder, unspecified 07/14/2019   Antisocial personality disorder (HCC) 07/14/2019    Orientation RESPIRATION BLADDER Height & Weight     Self  Normal Incontinent Weight: 72.6 kg Height:  5' 10 (177.8 cm)  BEHAVIORAL SYMPTOMS/MOOD NEUROLOGICAL BOWEL NUTRITION STATUS      Incontinent Diet (heart healthy/carb modified)  AMBULATORY STATUS COMMUNICATION OF NEEDS Skin   Extensive Assist Verbally PU Stage and Appropriate Care     PU Stage 3 Dressing:  (Cleanse with Vashe #[id number], not rinse, pat dry. Apply  Xeroform daily to the wound bed, top with foam dressing. The foam can stay up to 3 days if not saturated or soiling. Of to lift and reapply the Xeroform.)                  Personal Care Assistance Level of Assistance  Bathing, Dressing Bathing Assistance: Maximum assistance   Dressing Assistance: Maximum assistance     Functional Limitations Info  Hearing   Hearing Info: Impaired      SPECIAL CARE FACTORS FREQUENCY  PT (By licensed PT), OT (By licensed OT)     PT Frequency: 5 x week OT Frequency: 5 x week            Contractures Contractures Info: Not present    Additional Factors Info  Code Status, Allergies Code Status Info: Full Allergies Info: Bee Venom, Cheese, Coffea Arabica, Coffee Flavoring Agent (Non-screening)           Current Medications (04/14/2024):  This is the current hospital active medication list Current Facility-Administered Medications  Medication Dose Route Frequency Provider Last Rate Last Admin   clopidogrel  (PLAVIX ) tablet 75 mg  75 mg Oral Daily Pokhrel, Laxman, MD   75 mg at 04/12/24 0853   cyanocobalamin  (VITAMIN B12) tablet 1,000 mcg  1,000 mcg Oral Daily Pokhrel, Laxman, MD   1,000 mcg at 04/12/24 0853   enoxaparin  (LOVENOX ) injection 40 mg  40 mg Subcutaneous Q24H Awanda City, MD   40 mg at 04/13/24 2015   fluticasone  furoate-vilanterol (BREO ELLIPTA ) 100-25 MCG/ACT 1 puff  1 puff Inhalation Daily Pokhrel, Laxman, MD   1 puff at 04/12/24 0855   hydrALAZINE  (APRESOLINE ) injection 5 mg  5 mg Intravenous Q6H PRN Pokhrel, Laxman, MD       insulin  aspart (novoLOG ) injection 0-15 Units  0-15 Units Subcutaneous TID WC Duncan, Hazel V, MD   2 Units at 04/13/24 1652   insulin  aspart (novoLOG ) injection 0-5 Units  0-5 Units Subcutaneous QHS Cleatus Delayne GAILS, MD       insulin  glargine-yfgn (SEMGLEE ) injection 5 Units  5 Units Subcutaneous QHS Awanda City, MD   5 Units at 04/13/24 2048   LORazepam  (ATIVAN ) injection 0-4 mg  0-4 mg Intravenous Q2H PRN Awanda City, MD   4 mg at 04/13/24 2134   LORazepam  (ATIVAN ) tablet 0-4 mg  0-4 mg Oral Q6H PRN Awanda City, MD       melatonin tablet 5 mg  5 mg Oral QHS PRN Pokhrel, Laxman,  MD       metoprolol  succinate (TOPROL -XL) 24 hr tablet 50 mg  50 mg Oral Daily Pokhrel, Laxman, MD   50 mg at 04/12/24 9146   nicotine  (NICODERM CQ  - dosed in mg/24 hours) patch 21 mg  21 mg Transdermal Daily Pokhrel, Laxman, MD   21 mg at 04/14/24 9163   ondansetron  (ZOFRAN ) tablet 4 mg  4 mg Oral Q6H PRN Pokhrel, Laxman, MD       Or   ondansetron  (ZOFRAN ) injection 4 mg  4 mg Intravenous Q6H PRN Pokhrel, Laxman, MD       pantoprazole  (PROTONIX ) EC tablet 40 mg  40 mg Oral Daily Pokhrel, Laxman, MD   40 mg at 04/12/24 0853   polyethylene glycol (MIRALAX  / GLYCOLAX ) packet 17 g  17 g Oral Daily PRN Pokhrel, Laxman, MD       potassium chloride  SA (KLOR-CON  M) CR tablet 40 mEq  40 mEq Oral Daily Pokhrel, Laxman, MD   40 mEq at 04/12/24 [id number]  sodium chloride  flush (NS) 0.9 % injection 3 mL  3 mL Intravenous Q12H Pokhrel, Laxman, MD   3 mL at 04/14/24 0836   sodium chloride  flush (NS) 0.9 % injection 3 mL  3 mL Intravenous PRN Pokhrel, Laxman, MD       thiamine (VITAMIN B1) tablet 100 mg  100 mg Oral Daily Siadecki, Sebastian, MD   100 mg at 04/12/24 9146   Or   thiamine (VITAMIN B1) injection 100 mg  100 mg Intravenous Daily Jacolyn Pae, MD   100 mg at 04/14/24 9164     Discharge Medications: Please see discharge summary for a list of discharge medications.  Relevant Imaging Results:  Relevant Lab Results:   Additional Information SS# [id number]  Daved JONETTA Hamilton, RN

## 2024-04-14 NOTE — Progress Notes (Signed)
 Mobility Specialist Progress Note:    04/14/24 1145  Mobility  Activity Pivoted/transferred from chair to bed  Level of Assistance Moderate assist, patient does 50-74%  Assistive Device None  Range of Motion/Exercises Active  Activity Response Tolerated well  Mobility visit 1 Mobility  Mobility Specialist Start Time (ACUTE ONLY) 1109  Mobility Specialist Stop Time (ACUTE ONLY) 1130  Mobility Specialist Time Calculation (min) (ACUTE ONLY) 21 min   Pt received requesting assistance. Required ModA +2 for a lateral scoot transfer, requiring many verbal cues and reorientation. Left pt supine, alarm on and belongings in reach. All needs met.  Sherrilee Ditty Mobility Specialist Please contact via Special Educational Needs Teacher or  Rehab office at [phone]

## 2024-04-14 NOTE — Progress Notes (Signed)
 Occupational Therapy Treatment Patient Details Name: Timothy [name]. MRN: [id number] DOB: Sep 27, 1960 Today's Date: 04/14/2024   History of present illness Pt is a 63 y/o male who presented to ED on 04/10/24 after being found on floor by a bystander with AMS. PMH significant for R BKA, tobacco use, type 2 diabetes, HTN, HLD, COPD, PTSD, bipolar disorder, anxiety, CVA   OT comments  Pt seen for OT and PT co-tx to optimize safety with mobility efforts. Pt sleeping, wakes to voice and agreeable. Pt oriented to self and a hospital but remains confused. Endorses seeing hummingbirds in the room at one point. Increased time and cues to follow simple commands inconsistently. Set up for washing face with VC for initiation, MIN A for donning gown, MAX A for donning sock on L foot, and MOD A +2 for bed mobility and lateral scoot transfer from EOB to the recliner with R arm rest dropped. VC for sequencing. Pt unable to order lunch himself given cognition. Required multiple choice options for all categories in order to select items. Did not have his glasses so OT was required to read out options. Slow processing noted. Unable to recall entire meal order requiring MAX A to call dining to place order. Pt continues to benefit from skilled OT Services.       If plan is discharge home, recommend the following:  Two people to help with walking and/or transfers;A lot of help with bathing/dressing/bathroom;Assistance with cooking/housework;Assist for transportation;Help with stairs or ramp for entrance   Equipment Recommendations  Other (comment) (defer)    Recommendations for Other Services      Precautions / Restrictions Precautions Precautions: Fall Recall of Precautions/Restrictions: Impaired Restrictions Weight Bearing Restrictions Per Provider Order: No Other Position/Activity Restrictions: R BKA does not use a prosthetic       Mobility Bed Mobility Overal bed mobility: Needs Assistance Bed  Mobility: Supine to Sit     Supine to sit: Mod assist, +2 for physical assistance          Transfers   Equipment used: None Transfers: Bed to chair/wheelchair/BSC            Lateral/Scoot Transfers: Mod assist, +2 physical assistance General transfer comment: Required cueing for sequencing of transfer to recliner. ModA+2 for scooting x 2 over to recliner     Balance Overall balance assessment: Needs assistance Sitting-balance support: Feet supported, No upper extremity supported, Feet unsupported Sitting balance-Leahy Scale: Fair                                     ADL either performed or assessed with clinical judgement   ADL Overall ADL's : Needs assistance/impaired     Grooming: Sitting;Set up;Wash/dry face           Upper Body Dressing : Bed level;Minimal assistance                          Extremity/Trunk Assessment              Vision       Perception     Praxis     Communication Communication Communication: No apparent difficulties   Cognition Arousal: Alert Behavior During Therapy: WFL for tasks assessed/performed Cognition: No family/caregiver present to determine baseline             OT - Cognition Comments: pt oriented to self, hospital,  requires increased time for cognitive processing, endorses seeing hummingbirds in the room at one point                 Following commands: Impaired Following commands impaired: Follows one step commands inconsistently      Cueing   Cueing Techniques: Verbal cues, Gestural cues, Tactile cues  Exercises Other Exercises Other Exercises: Pt required assist for ordering lunch. Required multiple choice options for all categories in order to select something. Did not have his glasses so OT was required to read out options. Slow processing noted. Unable to recall entire meal order requiring MAX A to call dining to place order.    Shoulder Instructions        General Comments      Pertinent Vitals/ Pain       Pain Assessment Pain Assessment: No/denies pain  Home Living                                          Prior Functioning/Environment              Frequency  Min 2X/week        Progress Toward Goals  OT Goals(current goals can now be found in the care plan section)  Progress towards OT goals: Progressing toward goals  Acute Rehab OT Goals Patient Stated Goal: get stronger OT Goal Formulation: With patient Time For Goal Achievement: 04/25/24 Potential to Achieve Goals: Good  Plan      Co-evaluation    PT/OT/SLP Co-Evaluation/Treatment: Yes Reason for Co-Treatment: Complexity of the patient's impairments (multi-system involvement);To address functional/ADL transfers;For patient/therapist safety;Necessary to address cognition/behavior during functional activity PT goals addressed during session: Mobility/safety with mobility;Balance;Proper use of DME OT goals addressed during session: ADL's and self-care;Proper use of Adaptive equipment and DME      AM-PAC OT 6 Clicks Daily Activity     Outcome Measure   Help from another person eating meals?: A Little Help from another person taking care of personal grooming?: A Little Help from another person toileting, which includes using toliet, bedpan, or urinal?: A Lot Help from another person bathing (including washing, rinsing, drying)?: A Lot Help from another person to put on and taking off regular upper body clothing?: A Little Help from another person to put on and taking off regular lower body clothing?: A Lot 6 Click Score: 15    End of Session    OT Visit Diagnosis: Other abnormalities of gait and mobility (R26.89);Muscle weakness (generalized) (M62.81);Other symptoms and signs involving cognitive function   Activity Tolerance Patient tolerated treatment well   Patient Left in chair;with call bell/phone within reach;with chair alarm set    Nurse Communication          Time: 8995-8967 OT Time Calculation (min): 28 min  Charges: OT General Charges $OT Visit: 1 Visit OT Treatments $Therapeutic Activity: 8-22 mins  Cloyce SAUNDERS., MPH, MS, OTR/L ascom [phone] 04/14/24, 11:05 AM

## 2024-04-14 NOTE — Plan of Care (Signed)

## 2024-04-14 NOTE — Progress Notes (Signed)
 Physical Therapy Treatment Patient Details Name: Timothy [name]. MRN: [id number] DOB: 04-30-1961 Today's Date: 04/14/2024   History of Present Illness Pt is a 63 y/o male who presented to ED on 04/10/24 after being found on floor by a bystander with AMS. PMH significant for R BKA, tobacco use, type 2 diabetes, HTN, HLD, COPD, PTSD, bipolar disorder, anxiety, CVA    PT Comments  Patient supine in bed on arrival and agreeable to PT/OT treatment session. Required modA+2 for supine>sit with use of bed rails and cueing for step by step sequencing. Performed lateral scoot transfer to recliner with modA+2 as patient having difficulty clearing buttocks to scoot. Cueing required throughout for sequencing of transfer. Remains confused this date. Discharge plan remains appropriate.     If plan is discharge home, recommend the following: A lot of help with walking and/or transfers;A lot of help with bathing/dressing/bathroom;Assistance with cooking/housework;Direct supervision/assist for financial management;Direct supervision/assist for medications management;Assist for transportation;Help with stairs or ramp for entrance;Supervision due to cognitive status   Can travel by private vehicle        Equipment Recommendations  Other (comment) (TBD)    Recommendations for Other Services       Precautions / Restrictions Precautions Precautions: Fall Recall of Precautions/Restrictions: Impaired Restrictions Weight Bearing Restrictions Per Provider Order: No     Mobility  Bed Mobility Overal bed mobility: Needs Assistance Bed Mobility: Supine to Sit     Supine to sit: Mod assist, +2 for physical assistance          Transfers Overall transfer level: Needs assistance Equipment used: None Transfers: Bed to chair/wheelchair/BSC            Lateral/Scoot Transfers: Mod assist, +2 physical assistance General transfer comment: Required cueing for sequencing of transfer to recliner.  ModA+2 for scooting x 2 over to recliner    Ambulation/Gait                   Stairs             Wheelchair Mobility     Tilt Bed    Modified Rankin (Stroke Patients Only)       Balance Overall balance assessment: Needs assistance Sitting-balance support: Feet supported, No upper extremity supported Sitting balance-Leahy Scale: Fair                                      Hotel Manager: No apparent difficulties  Cognition Arousal: Alert Behavior During Therapy: WFL for tasks assessed/performed   PT - Cognitive impairments: No family/caregiver present to determine baseline                       PT - Cognition Comments: Oriented to self during session Following commands: Impaired Following commands impaired: Follows one step commands inconsistently    Cueing Cueing Techniques: Verbal cues, Gestural cues, Tactile cues  Exercises      General Comments        Pertinent Vitals/Pain Pain Assessment Pain Assessment: No/denies pain    Home Living                          Prior Function            PT Goals (current goals can now be found in the care plan section) Acute Rehab PT Goals Patient Stated Goal: did  not state PT Goal Formulation: With patient Time For Goal Achievement: 04/25/24 Potential to Achieve Goals: Fair Progress towards PT goals: Progressing toward goals    Frequency    Min 2X/week      PT Plan      Co-evaluation PT/OT/SLP Co-Evaluation/Treatment: Yes Reason for Co-Treatment: Complexity of the patient's impairments (multi-system involvement);To address functional/ADL transfers;For patient/therapist safety;Necessary to address cognition/behavior during functional activity PT goals addressed during session: Mobility/safety with mobility;Balance;Proper use of DME        AM-PAC PT 6 Clicks Mobility   Outcome Measure  Help needed turning from your back to  your side while in a flat bed without using bedrails?: A Lot Help needed moving from lying on your back to sitting on the side of a flat bed without using bedrails?: A Lot Help needed moving to and from a bed to a chair (including a wheelchair)?: A Lot Help needed standing up from a chair using your arms (e.g., wheelchair or bedside chair)?: A Lot Help needed to walk in hospital room?: Total Help needed climbing 3-5 steps with a railing? : Total 6 Click Score: 10    End of Session   Activity Tolerance: Patient tolerated treatment well Patient left: in chair;with call bell/phone within reach;with chair alarm set Nurse Communication: Mobility status PT Visit Diagnosis: Unsteadiness on feet (R26.81);Muscle weakness (generalized) (M62.81);Other abnormalities of gait and mobility (R26.89)     Time: 8995-8978 PT Time Calculation (min) (ACUTE ONLY): 17 min  Charges:    $Therapeutic Activity: 8-22 mins PT General Charges $$ ACUTE PT VISIT: 1 Visit                     Maryanne Finder, PT, DPT Physical Therapist - [facility]  [facility]    Kailoni Vahle A Henri Baumler 04/14/2024, 10:49 AM

## 2024-04-14 NOTE — TOC Progression Note (Signed)
 Transition of Care (TOC) - Progression Note    Patient Details  Name: Timothy [name]. MRN: [id number] Date of Birth: [birth year]  Transition of Care [facility]) CM/SW Contact  Daved JONETTA Hamilton, RN Phone Number: 04/14/2024, 3:11 PM  Clinical Narrative:     PASRR pending, Fl2 done  Due to patient medical status and orientation as well as no next of kin, this CM made report to [facility], spoke with Katrinka Ferrari. [name] advised report will be given to supervisor for review. [name] advised because this patient address on file is in [facility], this report may be transferred to [facility] : [facility] DSS. [name] advised this CM can call back tomorrow to obtain status of report/case number.     Barriers to Discharge: Continued Medical Work up, Other (must enter comment) (insurance authorization, alcohol use)               Expected Discharge Plan and Services   Discharge Planning Services: CM Consult   Living arrangements for the past 2 months: Single Family Home                                       Social Drivers of Health (SDOH) Interventions SDOH Screenings   Food Insecurity: Patient Unable To Answer (04/11/2024)  Housing: Patient Unable To Answer (04/11/2024)  Transportation Needs: Patient Unable To Answer (04/11/2024)  Utilities: Patient Unable To Answer (04/11/2024)  Alcohol Screen: Low Risk  (10/08/2023)  Depression (PHQ2-9): High Risk (10/03/2022)  Financial Resource Strain: Medium Risk (05/28/2022)  Physical Activity: Inactive (05/28/2022)  Social Connections: Moderately Isolated (10/07/2023)  Stress: Stress Concern Present (05/28/2022)  Tobacco Use: High Risk (04/10/2024)    Readmission Risk Interventions    11/06/2022   12:06 PM  Readmission Risk Prevention Plan  Transportation Screening Complete  PCP or Specialist Appt within 3-5 Days Complete  Social Work Consult for Recovery Care Planning/Counseling Complete  Palliative Care Screening Not  Applicable  Medication Review Oceanographer) Referral to Pharmacy

## 2024-04-14 NOTE — Progress Notes (Signed)
 RE: [name] Date of Birth: 03-20-1961 Date: 04/14/24      To Whom It May Concern:   Please be advised that the above-named patient will require a short-term nursing home stay - anticipated 30 days or less for rehabilitation and strengthening.  The plan is for return home

## 2024-04-15 DIAGNOSIS — Z794 Long term (current) use of insulin: Secondary | ICD-10-CM

## 2024-04-15 DIAGNOSIS — E876 Hypokalemia: Secondary | ICD-10-CM | POA: Insufficient documentation

## 2024-04-15 DIAGNOSIS — W19XXXA Unspecified fall, initial encounter: Secondary | ICD-10-CM | POA: Diagnosis not present

## 2024-04-15 DIAGNOSIS — T796XXA Traumatic ischemia of muscle, initial encounter: Secondary | ICD-10-CM

## 2024-04-15 DIAGNOSIS — E1140 Type 2 diabetes mellitus with diabetic neuropathy, unspecified: Secondary | ICD-10-CM

## 2024-04-15 DIAGNOSIS — Y92009 Unspecified place in unspecified non-institutional (private) residence as the place of occurrence of the external cause: Secondary | ICD-10-CM

## 2024-04-15 LAB — BASIC METABOLIC PANEL WITH GFR
Anion gap: 11 (ref 5–15)
BUN: 23 mg/dL (ref 8–23)
CO2: 25 mmol/L (ref 22–32)
Calcium: 8.2 mg/dL — ABNORMAL LOW (ref 8.9–10.3)
Chloride: 102 mmol/L (ref 98–111)
Creatinine, Ser: 1.19 mg/dL (ref 0.61–1.24)
GFR, Estimated: >60 mL/min (ref >60)
Glucose, Bld: 154 mg/dL — ABNORMAL HIGH (ref 70–99)
Potassium: 3.2 mmol/L — ABNORMAL LOW (ref 3.5–5.1)
Sodium: 138 mmol/L (ref 135–145)

## 2024-04-15 LAB — GLUCOSE, CAPILLARY
Glucose-Capillary: 178 mg/dL — ABNORMAL HIGH (ref 70–99)
Glucose-Capillary: 232 mg/dL — ABNORMAL HIGH (ref 70–99)
Glucose-Capillary: 194 mg/dL — ABNORMAL HIGH (ref 70–99)
Glucose-Capillary: 200 mg/dL — ABNORMAL HIGH (ref 70–99)

## 2024-04-15 LAB — PHOSPHORUS: Phosphorus: 2.4 mg/dL — ABNORMAL LOW (ref 2.5–4.6)

## 2024-04-15 LAB — MAGNESIUM: Magnesium: 1.9 mg/dL (ref 1.7–2.4)

## 2024-04-15 MED ORDER — GABAPENTIN 100 MG PO CAPS
100.0000 mg | ORAL_CAPSULE | Freq: Two times a day (BID) | ORAL | Status: DC
  Administered 2024-04-15 – 2024-04-22 (×15): 100 mg via ORAL
  Filled 2024-04-15 (×15): qty 1

## 2024-04-15 MED ORDER — POTASSIUM CHLORIDE CRYS ER 20 MEQ PO TBCR
40.0000 meq | EXTENDED_RELEASE_TABLET | ORAL | Status: AC
  Administered 2024-04-15 (×3): 40 meq via ORAL
  Filled 2024-04-15 (×2): qty 2

## 2024-04-15 MED ORDER — K PHOS MONO-SOD PHOS DI & MONO 155-852-130 MG PO TABS
250.0000 mg | ORAL_TABLET | Freq: Once | ORAL | Status: AC
  Administered 2024-04-15: 250 mg via ORAL
  Filled 2024-04-15: qty 1

## 2024-04-15 MED ORDER — ARIPIPRAZOLE 10 MG PO TABS
5.0000 mg | ORAL_TABLET | Freq: Every day | ORAL | Status: DC
  Administered 2024-04-15 – 2024-04-22 (×8): 5 mg via ORAL
  Filled 2024-04-15 (×8): qty 1

## 2024-04-15 MED ORDER — VITAMIN D 25 MCG (1000 UNIT) PO TABS
500.0000 [IU] | ORAL_TABLET | Freq: Every day | ORAL | Status: DC
  Administered 2024-04-15 – 2024-04-22 (×8): 500 [IU] via ORAL
  Filled 2024-04-15 (×8): qty 1

## 2024-04-15 NOTE — Plan of Care (Signed)

## 2024-04-15 NOTE — Progress Notes (Signed)
  Progress Note   Patient: Timothy [name]. FMW:[id number] DOB: May 15, 1961 DOA: 04/10/2024     5 DOS: the patient was seen and examined on 04/15/2024   Brief hospital course: Patient is a 63 year old male with past medical history of heavy alcohol abuse, CVA, hypertension, hyperlipidemia, COPD, major depression, bipolar disorder, anxiety was brought into the hospital by EMS after he was found to be in the floor at his home. Someone came to check on him at his house and was noted to be on the floor and had altered mental status. There was no mention of how long he was on the floor. He was last seen 2 days prior to this presentation. EMS noted that he had multiple empty alcohol bottles on his floor    Principal Problem:   Rhabdomyolysis Active Problems:   Hypertension   Type 2 diabetes mellitus with diabetic neuropathy, unspecified (HCC)   Depression   COPD (chronic obstructive pulmonary disease) (HCC)   Dyslipidemia (high LDL; low HDL)   Fall at home, initial encounter   Hypokalemia   Hypophosphatemia   Hypomagnesemia   Assessment and Plan: Fall and found on the floor.    Rhabdomyolysis secondary to fall. Frequent falls secondary to alcohol abuse. Patient condition has improved, PT/OT recommending nursing home placement.  Currently pending placement.   Hypokalemia Hypomag Hypophos Acute kidney injury Continue to replace potassium and phosphate.  Recheck levels tomorrow. Renal function has normalized.   Alcohol abuse with tremors on presentation.   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  Most likely has dementia.  Continue to follow.   Essential hypertension. --cont Toprol    History of CVA  --cont plavix    History of major depressive disorder, bipolar disorder/anxiety Will restart Abilify  and Neurontin .     COPD, Continue inhalers and bronchodilators   Diabetes mellitus type 2 uncontrolled with  hyperglycemia.. --A1c 8.1 --cont glargine 5u nightly --ACHS and SSI   Pressure injury left ankle stage 3, present on admission  Continue to monitor.      Subjective:  Has some confusion, otherwise no complaint.  Physical Exam: Vitals:   04/14/24 1911 04/15/24 0127 04/15/24 0325 04/15/24 0848  BP: 124/83  126/75 122/75  Pulse: 78  63 67  Resp: 18  18 16   Temp: 98.3 F (36.8 C)  98.6 F (37 C) 98.8 F (37.1 C)  TempSrc: Oral  Oral Oral  SpO2: 97%  95% 94%  Weight:  74.7 kg    Height:       General exam: Appears calm and comfortable  Respiratory system: Clear to auscultation. Respiratory effort normal. Cardiovascular system: S1 & S2 heard, RRR. No JVD, murmurs, rubs, gallops or clicks. No pedal edema. Gastrointestinal system: Abdomen is nondistended, soft and nontender. No organomegaly or masses felt. Normal bowel sounds heard. Central nervous system: Alert and oriented x2. No focal neurological deficits. Extremities: Right BKA. Skin: No rashes, lesions or ulcers Psychiatry: Judgement and insight appear normal. Mood & affect appropriate.    Data Reviewed:  Lab results reviewed.  Family Communication: None  Disposition: Status is: Inpatient Remains inpatient appropriate because: Unsafe discharge, pending nursing home placement     Time spent: 35 minutes  Author: Murvin Mana, MD 04/15/2024 11:54 AM  For on call review [email or web address].

## 2024-04-15 NOTE — Progress Notes (Signed)
 Physical Therapy Treatment Patient Details Name: Timothy [name]. MRN: [id number] DOB: [birth year] Today's Date: 04/15/2024   History of Present Illness Pt is a 63 y/o male who presented to ED on 04/10/24 after being found on floor by a bystander with AMS. PMH significant for R BKA, tobacco use, type 2 diabetes, HTN, HLD, COPD, PTSD, bipolar disorder, anxiety, CVA    PT Comments  Patient is agreeable to PT session. He was cooperative throughout. Patient was able to stand with +2 person assistance using rolling walker. Mild dizziness reported with standing. Unable to take steps at this time and continues to have generalized weakness. Recommend PT continue to follow to maximize independence and facilitate return to prior level of function.    If plan is discharge home, recommend the following: A lot of help with walking and/or transfers;A lot of help with bathing/dressing/bathroom;Assistance with cooking/housework;Direct supervision/assist for financial management;Direct supervision/assist for medications management;Assist for transportation;Help with stairs or ramp for entrance;Supervision due to cognitive status   Can travel by private vehicle        Equipment Recommendations   (TBD)    Recommendations for Other Services       Precautions / Restrictions Precautions Precautions: Fall Recall of Precautions/Restrictions: Impaired Restrictions Weight Bearing Restrictions Per Provider Order: No     Mobility  Bed Mobility Overal bed mobility: Needs Assistance Bed Mobility: Supine to Sit     Supine to sit: Mod assist Sit to supine: Supervision   General bed mobility comments: assistance for LE and trunk support to sit upright. no physical assistance required for getting back to bed    Transfers Overall transfer level: Needs assistance Equipment used: Rolling walker (2 wheels) Transfers: Sit to/from Stand Sit to Stand: Min assist, +2 physical assistance            General transfer comment: cues for anterior weight shifting. lifting assistance required for standing. patient reports feeling dizzy with standing    Ambulation/Gait             Pre-gait activities: attempted to have patient to a side step to the left, however unable to due to generalized weakness General Gait Details: not attempted   Stairs             Wheelchair Mobility     Tilt Bed    Modified Rankin (Stroke Patients Only)       Balance Overall balance assessment: Needs assistance Sitting-balance support: Feet supported, No upper extremity supported, Feet unsupported Sitting balance-Leahy Scale: Fair     Standing balance support: Bilateral upper extremity supported, Reliant on assistive device for balance Standing balance-Leahy Scale: Poor Standing balance comment: external support required                            Communication Communication Communication: No apparent difficulties  Cognition Arousal: Alert Behavior During Therapy: WFL for tasks assessed/performed   PT - Cognitive impairments: No family/caregiver present to determine baseline                       PT - Cognition Comments: decreased overall awareness of deficits, asking to go home. but did speak about going to rehab. he is cooperative throughout session Following commands: Impaired Following commands impaired: Follows one step commands with increased time    Cueing Cueing Techniques: Verbal cues, Gestural cues, Tactile cues  Exercises      General Comments  Pertinent Vitals/Pain Pain Assessment Pain Assessment: No/denies pain    Home Living                          Prior Function            PT Goals (current goals can now be found in the care plan section) Acute Rehab PT Goals Patient Stated Goal: to go home PT Goal Formulation: With patient Time For Goal Achievement: 04/25/24 Potential to Achieve Goals: Fair Progress towards  PT goals: Progressing toward goals    Frequency    Min 2X/week      PT Plan      Co-evaluation              AM-PAC PT 6 Clicks Mobility   Outcome Measure  Help needed turning from your back to your side while in a flat bed without using bedrails?: A Lot Help needed moving from lying on your back to sitting on the side of a flat bed without using bedrails?: A Lot Help needed moving to and from a bed to a chair (including a wheelchair)?: A Lot Help needed standing up from a chair using your arms (e.g., wheelchair or bedside chair)?: A Lot Help needed to walk in hospital room?: Total Help needed climbing 3-5 steps with a railing? : Total 6 Click Score: 10    End of Session   Activity Tolerance: Patient tolerated treatment well Patient left: in bed;with call bell/phone within reach;with bed alarm set   PT Visit Diagnosis: Unsteadiness on feet (R26.81);Muscle weakness (generalized) (M62.81);Other abnormalities of gait and mobility (R26.89)     Time: 9052-8993 PT Time Calculation (min) (ACUTE ONLY): 19 min  Charges:    $Therapeutic Activity: 8-22 mins PT General Charges $$ ACUTE PT VISIT: 1 Visit                     Timothy [name], PT, MPT    Timothy [name] 04/15/2024, 10:52 AM

## 2024-04-15 NOTE — TOC Progression Note (Signed)
 Transition of Care (TOC) - Progression Note    Patient Details  Name: Timothy [name]. MRN: [id number] Date of Birth: 07-28-1960  Transition of Care [facility]) CM/SW Contact  Corean ONEIDA Haddock, RN Phone Number: 04/15/2024, 9:55 AM  Clinical Narrative:       Signed Clinical uploaded to [facility] bed search initiated   Barriers to Discharge: Continued Medical Work up, Other (must enter comment) (insurance authorization, alcohol use)               Expected Discharge Plan and Services   Discharge Planning Services: CM Consult   Living arrangements for the past 2 months: Single Family Home                                       Social Drivers of Health (SDOH) Interventions SDOH Screenings   Food Insecurity: Patient Unable To Answer (04/11/2024)  Housing: Patient Unable To Answer (04/11/2024)  Transportation Needs: Patient Unable To Answer (04/11/2024)  Utilities: Patient Unable To Answer (04/11/2024)  Alcohol Screen: Low Risk  (10/08/2023)  Depression (PHQ2-9): High Risk (10/03/2022)  Financial Resource Strain: Medium Risk (05/28/2022)  Physical Activity: Inactive (05/28/2022)  Social Connections: Moderately Isolated (10/07/2023)  Stress: Stress Concern Present (05/28/2022)  Tobacco Use: High Risk (04/10/2024)    Readmission Risk Interventions    11/06/2022   12:06 PM  Readmission Risk Prevention Plan  Transportation Screening Complete  PCP or Specialist Appt within 3-5 Days Complete  Social Work Consult for Recovery Care Planning/Counseling Complete  Palliative Care Screening Not Applicable  Medication Review [employer]) Referral to Pharmacy

## 2024-04-16 DIAGNOSIS — T796XXA Traumatic ischemia of muscle, initial encounter: Secondary | ICD-10-CM | POA: Diagnosis not present

## 2024-04-16 DIAGNOSIS — Y92009 Unspecified place in unspecified non-institutional (private) residence as the place of occurrence of the external cause: Secondary | ICD-10-CM | POA: Diagnosis not present

## 2024-04-16 DIAGNOSIS — W19XXXA Unspecified fall, initial encounter: Secondary | ICD-10-CM | POA: Diagnosis not present

## 2024-04-16 LAB — BASIC METABOLIC PANEL WITH GFR
Anion gap: 8 (ref 5–15)
BUN: 17 mg/dL (ref 8–23)
CO2: 24 mmol/L (ref 22–32)
Calcium: 8 mg/dL — ABNORMAL LOW (ref 8.9–10.3)
Chloride: 105 mmol/L (ref 98–111)
Creatinine, Ser: 1.04 mg/dL (ref 0.61–1.24)
GFR, Estimated: >60 mL/min (ref >60)
Glucose, Bld: 193 mg/dL — ABNORMAL HIGH (ref 70–99)
Potassium: 3.7 mmol/L (ref 3.5–5.1)
Sodium: 137 mmol/L (ref 135–145)

## 2024-04-16 LAB — GLUCOSE, CAPILLARY
Glucose-Capillary: 190 mg/dL — ABNORMAL HIGH (ref 70–99)
Glucose-Capillary: 157 mg/dL — ABNORMAL HIGH (ref 70–99)
Glucose-Capillary: 137 mg/dL — ABNORMAL HIGH (ref 70–99)
Glucose-Capillary: 184 mg/dL — ABNORMAL HIGH (ref 70–99)

## 2024-04-16 LAB — MAGNESIUM: Magnesium: 1.7 mg/dL (ref 1.7–2.4)

## 2024-04-16 LAB — PHOSPHORUS: Phosphorus: 1.7 mg/dL — ABNORMAL LOW (ref 2.5–4.6)

## 2024-04-16 MED ORDER — POTASSIUM PHOSPHATES 15 MMOLE/5ML IV SOLN
30.0000 mmol | Freq: Once | INTRAVENOUS | Status: AC
  Administered 2024-04-16: 30 mmol via INTRAVENOUS
  Filled 2024-04-16: qty 10

## 2024-04-16 MED ORDER — ALUM & MAG HYDROXIDE-SIMETH 200-200-20 MG/5ML PO SUSP
30.0000 mL | ORAL | Status: DC | PRN
  Administered 2024-04-16: 30 mL via ORAL
  Filled 2024-04-16: qty 30

## 2024-04-16 NOTE — Progress Notes (Signed)
  Progress Note   Patient: Timothy [name]. FMW:[id number] DOB: [birth year] DOA: 04/10/2024     6 DOS: the patient was seen and examined on 04/16/2024   Brief hospital course: Patient is a 63 year old male with past medical history of heavy alcohol abuse, CVA, hypertension, hyperlipidemia, COPD, major depression, bipolar disorder, anxiety was brought into the hospital by EMS after he was found to be in the floor at his home. Someone came to check on him at his house and was noted to be on the floor and had altered mental status. There was no mention of how long he was on the floor. He was last seen 2 days prior to this presentation. EMS noted that he had multiple empty alcohol bottles on his floor    Principal Problem:   Traumatic rhabdomyolysis Active Problems:   Hypertension   Type 2 diabetes mellitus with diabetic neuropathy, unspecified (HCC)   Depression   COPD (chronic obstructive pulmonary disease) (HCC)   Dyslipidemia (high LDL; low HDL)   Fall at home, initial encounter   Hypokalemia   Hypophosphatemia   Hypomagnesemia   Assessment and Plan: Fall and found on the floor.    Rhabdomyolysis secondary to fall. Frequent falls secondary to alcohol abuse. Patient condition has improved, PT/OT recommending nursing home placement.  Currently pending placement.   Hypokalemia Hypomag Hypophos Acute kidney injury Renal function has been normalized, phosphate still low, give 30 mmol of IV phosphate.   Alcohol abuse with tremors on presentation.   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  Most likely has dementia.  Condition stable.   Essential hypertension. --cont Toprol    History of CVA  --cont plavix    History of major depressive disorder, bipolar disorder/anxiety Continue Abilify  and Neurontin .     COPD, Continue inhalers and bronchodilators   Diabetes mellitus type 2 uncontrolled with  hyperglycemia.. --A1c 8.1 --cont glargine 5u nightly --ACHS and SSI   Pressure injury left ankle stage 3, present on admission  Continue to monitor.       Subjective:  Patient doing well today, slept well.  No short of breath.  Physical Exam: Vitals:   04/15/24 1944 04/16/24 0342 04/16/24 0500 04/16/24 0738  BP: 127/77 121/74  135/81  Pulse: (!) 59 (!) 58  60  Resp: 16 16  15   Temp: 99.6 F (37.6 C) 99 F (37.2 C)  98.4 F (36.9 C)  TempSrc: Oral Oral  Oral  SpO2: 98% 95%  98%  Weight:   75 kg   Height:      General exam: Appears calm and comfortable  Respiratory system: Clear to auscultation. Respiratory effort normal. Cardiovascular system: S1 & S2 heard, RRR. No JVD, murmurs, rubs, gallops or clicks. No pedal edema. Gastrointestinal system: Abdomen is nondistended, soft and nontender. No organomegaly or masses felt. Normal bowel sounds heard. Central nervous system: Alert and oriented x2. No focal neurological deficits. Extremities: Symmetric 5 x 5 power. Skin: No rashes, lesions or ulcers Psychiatry:  Mood & affect appropriate.    Data Reviewed:  Lab results reviewed.  Family Communication: None  Disposition: Status is: Inpatient Remains inpatient appropriate because: Unsafe discharge option.     Time spent: 35 minutes  Author: Murvin Mana, MD 04/16/2024 11:38 AM  For on call review [email or web address].

## 2024-04-16 NOTE — Progress Notes (Signed)
 Occupational Therapy Treatment Patient Details Name: Timothy [name]. MRN: [id number] DOB: 07-May-1961 Today's Date: 04/16/2024   History of present illness Pt is a 63 y/o male who presented to ED on 04/10/24 after being found on floor by a bystander with AMS. PMH significant for R BKA, tobacco use, type 2 diabetes, HTN, HLD, COPD, PTSD, bipolar disorder, anxiety, CVA   OT comments  Patient seen for OT treatment on this date. Upon arrival to room patient in bed finishing lunch, agreeable to treatment. Patient transitioned to EOB without physical A, HOB elevated. Focus of session was on improving standing tolerance. Patient performed 3 sit<>stands with min A and max VC for hand placement/body mechanics, used r/w and gait belt; tolerated standing 30 seconds-1 minute. Patient able to perform stand pivot from EOB to recliner (with arm rest of recliner removed) with min A. Patient transferred back to bed with min A, returned to semi fowlers with no physical A. Patient ended treatment in bed with bed alarm on, RN present and all needs within reach. Patient making good progress toward goals, will continue to follow POC. Discharge recommendation remains appropriate.        If plan is discharge home, recommend the following:  Two people to help with walking and/or transfers;A lot of help with bathing/dressing/bathroom;Assistance with cooking/housework;Assist for transportation;Help with stairs or ramp for entrance   Equipment Recommendations  Other (comment) (defer to next venue of care)    Recommendations for Other Services      Precautions / Restrictions Precautions Precautions: Fall Recall of Precautions/Restrictions: Impaired Restrictions Weight Bearing Restrictions Per Provider Order: No Other Position/Activity Restrictions: R BKA does not use a prosthetic       Mobility Bed Mobility Overal bed mobility: Needs Assistance Bed Mobility: Supine to Sit, Sit to Supine     Supine to  sit: Supervision Sit to supine: Supervision   General bed mobility comments: no physical A required    Transfers Overall transfer level: Needs assistance Equipment used: Rolling walker (2 wheels) Transfers: Sit to/from Stand, Bed to chair/wheelchair/BSC Sit to Stand: Min assist, From elevated surface Stand pivot transfers: Min assist         General transfer comment: patient performed SPT with r/w with min A     Balance Overall balance assessment: Needs assistance Sitting-balance support: Feet supported, No upper extremity supported, Feet unsupported Sitting balance-Leahy Scale: Good     Standing balance support: Bilateral upper extremity supported, Reliant on assistive device for balance Standing balance-Leahy Scale: Poor Standing balance comment: external support required                           ADL either performed or assessed with clinical judgement   ADL Overall ADL's : Needs assistance/impaired     Grooming: Wash/dry hands;Sitting;Set up               Lower Body Dressing: Minimal assistance;Sitting/lateral leans Lower Body Dressing Details (indicate cue type and reason): able to don L sock without A, increased time/effort required Toilet Transfer: Moderate assistance;Rolling walker (2 wheels) (simulated)                  Extremity/Trunk Assessment Upper Extremity Assessment Upper Extremity Assessment: Overall WFL for tasks assessed   Lower Extremity Assessment Lower Extremity Assessment: Defer to PT evaluation        Vision       Perception     Praxis  Communication Communication Communication: No apparent difficulties   Cognition Arousal: Alert Behavior During Therapy: WFL for tasks assessed/performed Cognition: No family/caregiver present to determine baseline   Orientation impairments: Situation                           Following commands: Impaired Following commands impaired: Follows one step  commands with increased time      Cueing   Cueing Techniques: Verbal cues, Gestural cues, Tactile cues  Exercises      Shoulder Instructions       General Comments      Pertinent Vitals/ Pain       Pain Assessment Pain Assessment: No/denies pain  Home Living                                          Prior Functioning/Environment              Frequency  Min 2X/week        Progress Toward Goals  OT Goals(current goals can now be found in the care plan section)  Progress towards OT goals: Progressing toward goals  Acute Rehab OT Goals Patient Stated Goal: none stated Time For Goal Achievement: 04/25/24 Potential to Achieve Goals: Good ADL Goals Pt Will Perform Grooming: sitting;with set-up;with supervision Pt Will Perform Lower Body Dressing: sitting/lateral leans;with supervision;with set-up;with adaptive equipment Pt Will Transfer to Toilet: squat pivot transfer;bedside commode;with supervision;with set-up Pt Will Perform Toileting - Clothing Manipulation and hygiene: with set-up;with supervision;with adaptive equipment;sitting/lateral leans  Plan      Co-evaluation                 AM-PAC OT 6 Clicks Daily Activity     Outcome Measure   Help from another person eating meals?: None Help from another person taking care of personal grooming?: A Little Help from another person toileting, which includes using toliet, bedpan, or urinal?: A Lot Help from another person bathing (including washing, rinsing, drying)?: A Lot Help from another person to put on and taking off regular upper body clothing?: A Little Help from another person to put on and taking off regular lower body clothing?: A Lot 6 Click Score: 16    End of Session Equipment Utilized During Treatment: Gait belt;Rolling walker (2 wheels)  OT Visit Diagnosis: Other abnormalities of gait and mobility (R26.89);Muscle weakness (generalized) (M62.81);Other symptoms and signs  involving cognitive function   Activity Tolerance Patient tolerated treatment well   Patient Left in bed;with call bell/phone within reach;with bed alarm set;with nursing/sitter in room   Nurse Communication Other (comment) (none)        Time: 8685-8663 OT Time Calculation (min): 22 min  Charges: OT General Charges $OT Visit: 1 Visit OT Treatments $Self Care/Home Management : 8-22 mins  Rogers Clause, OT/L MSOT, 04/16/2024

## 2024-04-16 NOTE — Plan of Care (Signed)

## 2024-04-16 NOTE — Progress Notes (Signed)
 Physical Therapy Treatment Patient Details Name: Timothy [name]. MRN: [id number] DOB: 01-30-1961 Today's Date: 04/16/2024   History of Present Illness Pt is a 63 y/o male who presented to ED on 04/10/24 after being found on floor by a bystander with AMS. PMH significant for R BKA, tobacco use, type 2 diabetes, HTN, HLD, COPD, PTSD, bipolar disorder, anxiety, CVA    PT Comments  Pt was laying in the dark asleep upon arrival. He easily awakes and remains alert throughout session however pt is only truly oriented to self. Cognition seemed to improve throughout session with increased conversation and reminders of reason for admission. Pt endorses spending most of his days in w/c at home.  He was encouraged to increase OOB time and to work on improving ROM of R knee in prep for prosthetic in future. BKA HEP handout issued and education provided. Will need further education in future sessions due to current cognitive state.  DC recs remain appropriate to maximize his independence and safety with all ADLs. Acute PT will continue to follow per current POC.     If plan is discharge home, recommend the following: A lot of help with walking and/or transfers;A lot of help with bathing/dressing/bathroom;Assistance with cooking/housework;Direct supervision/assist for financial management;Direct supervision/assist for medications management;Assist for transportation;Help with stairs or ramp for entrance;Supervision due to cognitive status     Equipment Recommendations  Rolling walker (2 wheels) (pt endorses having personal w/c at home already)       Precautions / Restrictions Precautions Precautions: Fall Recall of Precautions/Restrictions: Impaired Restrictions Weight Bearing Restrictions Per Provider Order: No Other Position/Activity Restrictions: R BKA does not use a prosthetic     Mobility  Bed Mobility Overal bed mobility: Needs Assistance Bed Mobility: Supine to Sit, Sit to Supine  Supine to  sit: Supervision Sit to supine: Supervision General bed mobility comments: Pt was easily and safely able to exit R side of bed. slightly impulsiviely goes from supine to EOB sitting.sitting balance was good once seated EOB.    Transfers Overall transfer level: Needs assistance Equipment used: Rolling walker (2 wheels) Transfers: Sit to/from Stand Sit to Stand: Contact guard assist, Min assist  General transfer comment: Pt stood EOB 3 x to RW with CGA-min assist. Vcs for improved technique and safety. Overall pt stand better than he felt he could. Author encouraged pt to get OOB more often    Ambulation/Gait Ambulation/Gait assistance: Contact guard assist, Min assist Gait Distance (Feet): 3 Feet Assistive device: Rolling walker (2 wheels) Gait Pattern/deviations: Step-to pattern Gait velocity: decreased  General Gait Details: Pt was able to hop On LLE L/R ~ 3 ft. Does fatigue quicklly with all standing activity however can perform more than pt leads on   Balance Overall balance assessment: Needs assistance Sitting-balance support: Feet supported, No upper extremity supported, Feet unsupported Sitting balance-Leahy Scale: Good     Standing balance support: Bilateral upper extremity supported, Reliant on assistive device for balance Standing balance-Leahy Scale: Fair Standing balance comment: Reliant on BUE support during all standing however with BUE support, no LOB observed      Communication Communication Communication: No apparent difficulties  Cognition Arousal: Alert Behavior During Therapy: WFL for tasks assessed/performed   PT - Cognitive impairments: No family/caregiver present to determine baseline    PT - Cognition Comments: Pt was asleep upon arrival. Only truely oriented to self. cognition seems to improve throughout session. Pt endorses mostly spending days in w/c and not ambulatory Following commands: Impaired Following  commands impaired: Follows one step  commands with increased time    Cueing Cueing Techniques: Verbal cues     General Comments General comments (skin integrity, edema, etc.): Chartered Loss Adjuster issued pt BKA HEP handout and encouraged pt to perform frequently. Pt has tight HS on RLE and will not be able to wear prosthesis effectively in current state. max encouragement for pt to stretch daily in hopes he can aquire prosthesis in future.      Pertinent Vitals/Pain Pain Assessment Pain Assessment: 0-10 Pain Score: 4  Pain Location: endorses chronic LBP Pain Descriptors / Indicators: Aching, Sore Pain Intervention(s): Limited activity within patient's tolerance, Monitored during session, Premedicated before session, Repositioned     PT Goals (current goals can now be found in the care plan section) Acute Rehab PT Goals Patient Stated Goal: get my wallet before my roomate steals it Progress towards PT goals: Progressing toward goals    Frequency    Min 2X/week           Co-evaluation     PT goals addressed during session: Mobility/safety with mobility;Balance;Proper use of DME;Strengthening/ROM        AM-PAC PT 6 Clicks Mobility   Outcome Measure  Help needed turning from your back to your side while in a flat bed without using bedrails?: A Lot Help needed moving from lying on your back to sitting on the side of a flat bed without using bedrails?: A Lot Help needed moving to and from a bed to a chair (including a wheelchair)?: A Little Help needed standing up from a chair using your arms (e.g., wheelchair or bedside chair)?: A Little Help needed to walk in hospital room?: A Little Help needed climbing 3-5 steps with a railing? : A Lot 6 Click Score: 15    End of Session   Activity Tolerance: Patient tolerated treatment well Patient left: in bed;with call bell/phone within reach;with bed alarm set Nurse Communication: Mobility status PT Visit Diagnosis: Unsteadiness on feet (R26.81);Muscle weakness  (generalized) (M62.81);Other abnormalities of gait and mobility (R26.89)     Time: 8466-8452 PT Time Calculation (min) (ACUTE ONLY): 14 min  Charges:    $Therapeutic Activity: 8-22 mins PT General Charges $$ ACUTE PT VISIT: 1 Visit                     Rankin Essex PTA 04/16/24, 4:56 PM

## 2024-04-17 ENCOUNTER — Inpatient Hospital Stay: Payer: MEDICAID

## 2024-04-17 DIAGNOSIS — J449 Chronic obstructive pulmonary disease, unspecified: Secondary | ICD-10-CM

## 2024-04-17 DIAGNOSIS — Z794 Long term (current) use of insulin: Secondary | ICD-10-CM | POA: Diagnosis not present

## 2024-04-17 DIAGNOSIS — E1140 Type 2 diabetes mellitus with diabetic neuropathy, unspecified: Secondary | ICD-10-CM | POA: Diagnosis not present

## 2024-04-17 LAB — GLUCOSE, CAPILLARY
Glucose-Capillary: 221 mg/dL — ABNORMAL HIGH (ref 70–99)
Glucose-Capillary: 172 mg/dL — ABNORMAL HIGH (ref 70–99)
Glucose-Capillary: 236 mg/dL — ABNORMAL HIGH (ref 70–99)

## 2024-04-17 LAB — BRAIN NATRIURETIC PEPTIDE: B Natriuretic Peptide: 68.5 pg/mL (ref 0.0–100.0)

## 2024-04-17 MED ORDER — ALBUTEROL SULFATE (2.5 MG/3ML) 0.083% IN NEBU: 3.0000 mL | INHALATION_SOLUTION | Freq: Four times a day (QID) | RESPIRATORY_TRACT | Status: DC | PRN

## 2024-04-17 MED ORDER — MORPHINE SULFATE (PF) 2 MG/ML IV SOLN
2.0000 mg | INTRAVENOUS | Status: DC | PRN
  Administered 2024-04-17 – 2024-04-18 (×3): 2 mg via INTRAVENOUS
  Filled 2024-04-17 (×3): qty 1

## 2024-04-17 NOTE — Plan of Care (Signed)

## 2024-04-17 NOTE — TOC Progression Note (Signed)
 Transition of Care (TOC) - Progression Note    Patient Details  Name: Timothy [name]. MRN: [id number] Date of Birth: 11/13/1960  Transition of Care [facility] CM/SW Contact  Marinda Cooks, RN Phone Number: 04/17/2024, 9:08 AM  Clinical Narrative:    This CM placed call to listed [facility] DSS worker regarding SPS report to be provided updates , was sent to VM detailed message left including this CM number to return call. TOC will cont to follow pt's dc planning / care coordination and update as applicable.      Barriers to Discharge: Continued Medical Work up, Other (must enter comment) (insurance authorization, alcohol use)               Expected Discharge Plan and Services   Discharge Planning Services: CM Consult   Living arrangements for the past 2 months: Single Family Home                                       Social Drivers of Health (SDOH) Interventions SDOH Screenings   Food Insecurity: Patient Unable To Answer (04/11/2024)  Housing: Patient Unable To Answer (04/11/2024)  Transportation Needs: Patient Unable To Answer (04/11/2024)  Utilities: Patient Unable To Answer (04/11/2024)  Alcohol Screen: Low Risk  (10/08/2023)  Depression (PHQ2-9): High Risk (10/03/2022)  Financial Resource Strain: Medium Risk (05/28/2022)  Physical Activity: Inactive (05/28/2022)  Social Connections: Moderately Isolated (10/07/2023)  Stress: Stress Concern Present (05/28/2022)  Tobacco Use: High Risk (04/10/2024)    Readmission Risk Interventions    11/06/2022   12:06 PM  Readmission Risk Prevention Plan  Transportation Screening Complete  PCP or Specialist Appt within 3-5 Days Complete  Social Work Consult for Recovery Care Planning/Counseling Complete  Palliative Care Screening Not Applicable  Medication Review Oceanographer) Referral to Pharmacy

## 2024-04-17 NOTE — Progress Notes (Signed)
  Progress Note   Patient: Timothy [name]. FMW:[id number] DOB: 1960-10-21 DOA: 04/10/2024     7 DOS: the patient was seen and examined on 04/17/2024   Brief hospital course: Patient is a 63 year old male with past medical history of heavy alcohol abuse, CVA, hypertension, hyperlipidemia, COPD, major depression, bipolar disorder, anxiety was brought into the hospital by EMS after he was found to be in the floor at his home. Someone came to check on him at his house and was noted to be on the floor and had altered mental status. There was no mention of how long he was on the floor. He was last seen 2 days prior to this presentation. EMS noted that he had multiple empty alcohol bottles on his floor    Principal Problem:   Traumatic rhabdomyolysis Active Problems:   Hypertension   Type 2 diabetes mellitus with diabetic neuropathy, unspecified (HCC)   Depression   COPD (chronic obstructive pulmonary disease) (HCC)   Dyslipidemia (high LDL; low HDL)   Fall at home, initial encounter   Hypokalemia   Hypophosphatemia   Hypomagnesemia   Assessment and Plan: COPD. Feeling well complaining some short of breath last night, no wheezing. BNP was normal, chest x-ray did not show acute changes.  Will add albuterol  as needed and continue scheduled long-acting bronchodilator.  Fall and found on the floor.    Rhabdomyolysis secondary to fall. Frequent falls secondary to alcohol abuse. Patient condition has improved, PT/OT recommending nursing home placement.  Currently pending placement.   Hypokalemia Hypomag Hypophos Acute kidney injury Will recheck levels tomorrow.   Alcohol abuse with tremors on presentation.   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  Most likely has dementia.  Condition stable.   Essential hypertension. --cont Toprol    History of CVA  --cont plavix    History of major depressive disorder, bipolar  disorder/anxiety Continue Abilify  and Neurontin .   Diabetes mellitus type 2 uncontrolled with hyperglycemia.. --A1c 8.1 --cont glargine 5u nightly --ACHS and SSI   Pressure injury left ankle stage 3, present on admission  Continue to monitor.         Subjective:  Had some short of breath last night, no cough.  No chest pain.  Physical Exam: Vitals:   04/16/24 1524 04/16/24 1930 04/17/24 0453 04/17/24 0805  BP: 136/78 115/74 116/70 125/78  Pulse: 64 (!) 59 60 67  Resp: 17 16 16 14   Temp: 98.4 F (36.9 C) 98 F (36.7 C) 98 F (36.7 C) 98.4 F (36.9 C)  TempSrc: Oral Oral    SpO2: 100% 98% 95% 97%  Weight:      Height:       General exam: Appears calm and comfortable  Respiratory system: Decreased breath sounds. Respiratory effort normal. Cardiovascular system: S1 & S2 heard, RRR. No JVD, murmurs, rubs, gallops or clicks. No pedal edema. Gastrointestinal system: Abdomen is nondistended, soft and nontender. No organomegaly or masses felt. Normal bowel sounds heard. Central nervous system: Alert and oriented. No focal neurological deficits. Extremities: BKA on right Skin: No rashes, lesions or ulcers Psychiatry: Judgement and insight appear normal. Mood & affect appropriate.    Data Reviewed:  Chest x-ray and lab results  Family Communication: None  Disposition: Status is: Inpatient Remains inpatient appropriate because: Unsafe discharge.     Time spent: 35 minutes  Author: Murvin Mana, MD 04/17/2024 11:30 AM  For on call review [email or web address].

## 2024-04-17 NOTE — Progress Notes (Signed)
 Mobility Specialist - Progress Note   04/17/24 1041  Mobility  Activity Pivoted/transferred from bed to chair  Level of Assistance Contact guard assist, steadying assist  Assistive Device None  Distance Ambulated (ft) 2 ft  Activity Response Tolerated well  Mobility visit 1 Mobility  Mobility Specialist Start Time (ACUTE ONLY) 1022  Mobility Specialist Stop Time (ACUTE ONLY) 1030  Mobility Specialist Time Calculation (min) (ACUTE ONLY) 8 min   Pt supine upon entry, utilizing RA. Pt transferred to the recliner via scoot transfer CGA-MinG, tolerated well. Pt left seated in the recliner with alarm set and needs within reach.  America Silvan Mobility Specialist 04/17/24 10:48 AM

## 2024-04-18 DIAGNOSIS — Z794 Long term (current) use of insulin: Secondary | ICD-10-CM | POA: Diagnosis not present

## 2024-04-18 DIAGNOSIS — E1140 Type 2 diabetes mellitus with diabetic neuropathy, unspecified: Secondary | ICD-10-CM | POA: Diagnosis not present

## 2024-04-18 DIAGNOSIS — T796XXA Traumatic ischemia of muscle, initial encounter: Secondary | ICD-10-CM | POA: Diagnosis not present

## 2024-04-18 DIAGNOSIS — J449 Chronic obstructive pulmonary disease, unspecified: Secondary | ICD-10-CM | POA: Diagnosis not present

## 2024-04-18 LAB — GLUCOSE, CAPILLARY
Glucose-Capillary: 207 mg/dL — ABNORMAL HIGH (ref 70–99)
Glucose-Capillary: 188 mg/dL — ABNORMAL HIGH (ref 70–99)
Glucose-Capillary: 185 mg/dL — ABNORMAL HIGH (ref 70–99)
Glucose-Capillary: 217 mg/dL — ABNORMAL HIGH (ref 70–99)

## 2024-04-18 LAB — BASIC METABOLIC PANEL WITH GFR
Anion gap: 8 (ref 5–15)
BUN: 16 mg/dL (ref 8–23)
CO2: 24 mmol/L (ref 22–32)
Calcium: 7.8 mg/dL — ABNORMAL LOW (ref 8.9–10.3)
Chloride: 101 mmol/L (ref 98–111)
Creatinine, Ser: 1.24 mg/dL (ref 0.61–1.24)
GFR, Estimated: >60 mL/min (ref >60)
Glucose, Bld: 249 mg/dL — ABNORMAL HIGH (ref 70–99)
Potassium: 4 mmol/L (ref 3.5–5.1)
Sodium: 133 mmol/L — ABNORMAL LOW (ref 135–145)

## 2024-04-18 LAB — CBC
HCT: 35.3 % — ABNORMAL LOW (ref 39.0–52.0)
Hemoglobin: 12.2 g/dL — ABNORMAL LOW (ref 13.0–17.0)
MCH: 31.2 pg (ref 26.0–34.0)
MCHC: 34.6 g/dL (ref 30.0–36.0)
MCV: 90.3 fL (ref 80.0–100.0)
Platelets: 271 K/uL (ref 150–400)
RBC: 3.91 MIL/uL — ABNORMAL LOW (ref 4.22–5.81)
RDW: 12.1 % (ref 11.5–15.5)
WBC: 5.7 K/uL (ref 4.0–10.5)
nRBC: 0 % (ref 0.0–0.2)

## 2024-04-18 LAB — PHOSPHORUS: Phosphorus: 2.7 mg/dL (ref 2.5–4.6)

## 2024-04-18 LAB — MAGNESIUM: Magnesium: 1.6 mg/dL — ABNORMAL LOW (ref 1.7–2.4)

## 2024-04-18 MED ORDER — IPRATROPIUM-ALBUTEROL 0.5-2.5 (3) MG/3ML IN SOLN
3.0000 mL | Freq: Three times a day (TID) | RESPIRATORY_TRACT | Status: DC
  Administered 2024-04-18 – 2024-04-19 (×3): 3 mL via RESPIRATORY_TRACT
  Filled 2024-04-18 (×3): qty 3

## 2024-04-18 MED ORDER — MAGNESIUM SULFATE 2 GM/50ML IV SOLN
2.0000 g | Freq: Once | INTRAVENOUS | Status: AC
  Administered 2024-04-18: 2 g via INTRAVENOUS
  Filled 2024-04-18: qty 50

## 2024-04-18 MED ORDER — ALBUTEROL SULFATE (2.5 MG/3ML) 0.083% IN NEBU
3.0000 mL | INHALATION_SOLUTION | RESPIRATORY_TRACT | Status: DC | PRN
Start: 2024-04-18 — End: 2024-04-22
  Administered 2024-04-22: 3 mL via RESPIRATORY_TRACT
  Filled 2024-04-18: qty 3

## 2024-04-18 MED ORDER — MORPHINE SULFATE (PF) 2 MG/ML IV SOLN: 2.0000 mg | INTRAVENOUS | Status: DC | PRN

## 2024-04-18 NOTE — Plan of Care (Signed)

## 2024-04-18 NOTE — Progress Notes (Addendum)
 Progress Note   Patient: Timothy [name]. FMW:[id number] DOB: Jul 08, 1960 DOA: 04/10/2024     8 DOS: the patient was seen and examined on 04/18/2024   Brief hospital course: Patient is a 63 year old male with past medical history of heavy alcohol abuse, CVA, hypertension, hyperlipidemia, COPD, major depression, bipolar disorder, anxiety was brought into the hospital by EMS after he was found to be in the floor at his home. Someone came to check on him at his house and was noted to be on the floor and had altered mental status. There was no mention of how long he was on the floor. He was last seen 2 days prior to this presentation. EMS noted that he had multiple empty alcohol bottles on his floor    Principal Problem:   Traumatic rhabdomyolysis Active Problems:   Hypertension   Type 2 diabetes mellitus with diabetic neuropathy, unspecified (HCC)   Depression   COPD (chronic obstructive pulmonary disease) (HCC)   Dyslipidemia (high LDL; low HDL)   Fall at home, initial encounter   Hypokalemia   Hypophosphatemia   Hypomagnesemia   Assessment and Plan:  COPD. Feeling well complaining some short of breath last night, no wheezing. BNP was normal, chest x-ray did not show acute changes.   Patient is now is saying that he has been having short of breath for months, does not seem to have any bronchospasm.  I have scheduled DuoNeb, continue Breo.  May consider start oral steroids if condition does not improve.   Fall and found on the floor.    Rhabdomyolysis secondary to fall. Frequent falls secondary to alcohol abuse. Patient condition has improved, PT/OT recommending nursing home placement.  Currently pending placement.   Hypokalemia Hypomag Hypophos Acute kidney injury Given magnesium  again today.   Alcohol abuse with tremors on presentation.   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  Most likely has dementia.   Condition stable.   Essential hypertension. --cont Toprol    History of CVA  --cont plavix    History of major depressive disorder, bipolar disorder/anxiety Continue Abilify  and Neurontin .   Diabetes mellitus type 2 uncontrolled with hyperglycemia.. --A1c 8.1 --cont glargine 5u nightly --ACHS and SSI   Pressure injury left ankle stage 3, present on admission  Continue to monitor.         Subjective:  Patient still complaining some short of breath, mainly in the daytime, also at evening time.  No cough.  Physical Exam: Vitals:   04/17/24 1600 04/17/24 1956 04/18/24 0411 04/18/24 0913  BP: 127/73 139/83 113/80 111/63  Pulse: (!) 59 62 60 61  Resp: 16 16 16 16   Temp: 98.8 F (37.1 C) 98 F (36.7 C) 97.8 F (36.6 C) 97.9 F (36.6 C)  TempSrc:  Oral Oral   SpO2: 97% 100% 97% 100%  Weight:      Height:       General exam: Appears calm and comfortable  Respiratory system: Decreased breath sounds. Respiratory effort normal. Cardiovascular system: S1 & S2 heard, RRR. No JVD, murmurs, rubs, gallops or clicks. No pedal edema. Gastrointestinal system: Abdomen is nondistended, soft and nontender. No organomegaly or masses felt. Normal bowel sounds heard. Central nervous system: Alert and oriented x2. No focal neurological deficits. Extremities: Right BKA Skin: No rashes, lesions or ulcers Psychiatry:  Mood & affect appropriate.    Data Reviewed:  There are no new results to review at this time.  Family Communication: None  Disposition: Status is: Inpatient Remains inpatient appropriate because: Unsafe discharge pending placement.     Time spent: 35 minutes  Author: Murvin Mana, MD 04/18/2024 11:12 AM  For on call review [email or web address].

## 2024-04-19 DIAGNOSIS — J449 Chronic obstructive pulmonary disease, unspecified: Secondary | ICD-10-CM | POA: Diagnosis not present

## 2024-04-19 DIAGNOSIS — T796XXA Traumatic ischemia of muscle, initial encounter: Secondary | ICD-10-CM | POA: Diagnosis not present

## 2024-04-19 LAB — GLUCOSE, CAPILLARY
Glucose-Capillary: 190 mg/dL — ABNORMAL HIGH (ref 70–99)
Glucose-Capillary: 204 mg/dL — ABNORMAL HIGH (ref 70–99)
Glucose-Capillary: 169 mg/dL — ABNORMAL HIGH (ref 70–99)
Glucose-Capillary: 282 mg/dL — ABNORMAL HIGH (ref 70–99)
Glucose-Capillary: 188 mg/dL — ABNORMAL HIGH (ref 70–99)

## 2024-04-19 MED ORDER — IPRATROPIUM-ALBUTEROL 0.5-2.5 (3) MG/3ML IN SOLN
3.0000 mL | Freq: Two times a day (BID) | RESPIRATORY_TRACT | Status: DC
  Administered 2024-04-19 – 2024-04-20 (×3): 3 mL via RESPIRATORY_TRACT
  Filled 2024-04-19 (×4): qty 3

## 2024-04-19 NOTE — Progress Notes (Signed)
 Progress Note   Patient: Timothy [name]. FMW:[id number] DOB: 04-Mar-1961 DOA: 04/10/2024     9 DOS: the patient was seen and examined on 04/19/2024   Brief hospital course: Patient is a 63 year old male with past medical history of heavy alcohol abuse, CVA, hypertension, hyperlipidemia, COPD, major depression, bipolar disorder, anxiety was brought into the hospital by EMS after he was found to be in the floor at his home. Someone came to check on him at his house and was noted to be on the floor and had altered mental status. There was no mention of how long he was on the floor. He was last seen 2 days prior to this presentation. EMS noted that he had multiple empty alcohol bottles on his floor    Principal Problem:   Traumatic rhabdomyolysis Active Problems:   Hypertension   Type 2 diabetes mellitus with diabetic neuropathy, unspecified (HCC)   Depression   COPD (chronic obstructive pulmonary disease) (HCC)   Dyslipidemia (high LDL; low HDL)   Fall at home, initial encounter   Hypokalemia   Hypophosphatemia   Hypomagnesemia   Assessment and Plan: COPD. Feeling well complaining some short of breath last night, no wheezing. BNP was normal, chest x-ray did not show acute changes.   Patient is now is saying that he has been having short of breath for months, does not seem to have any bronchospasm.  I have scheduled DuoNeb, continue Breo.  Shortness of breath much improved.   Fall and found on the floor.    Rhabdomyolysis secondary to fall. Frequent falls secondary to alcohol abuse. Patient condition has improved, PT/OT recommending nursing home placement.  Currently pending placement.   Hypokalemia Hypomag Hypophos Acute kidney injury Check labs tomorrow.   Alcohol abuse with tremors on presentation.   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  Most likely has dementia.  Condition stable.   Essential  hypertension. --cont Toprol    History of CVA  --cont plavix    History of major depressive disorder, bipolar disorder/anxiety Continue Abilify  and Neurontin .   Diabetes mellitus type 2 uncontrolled with hyperglycemia.. --A1c 8.1 --cont glargine 5u nightly --ACHS and SSI   Pressure injury left ankle stage 3, present on admission  Continue to monitor.          Subjective:  Patient no longer feels short of breath.  No cough.  Physical Exam: Vitals:   04/19/24 0429 04/19/24 0500 04/19/24 0710 04/19/24 0743  BP: 127/76   124/70  Pulse: (!) 59   65  Resp: 18   16  Temp: 98.3 F (36.8 C)   98.5 F (36.9 C)  TempSrc:    Oral  SpO2: 100%  97% 95%  Weight:  75 kg    Height:       General exam: Appears calm and comfortable  Respiratory system: Decreased breath sounds. Respiratory effort normal. Cardiovascular system: S1 & S2 heard, RRR. No JVD, murmurs, rubs, gallops or clicks. No pedal edema. Gastrointestinal system: Abdomen is nondistended, soft and nontender. No organomegaly or masses felt. Normal bowel sounds heard. Central nervous system: Alert and oriented x2. No focal neurological deficits. Extremities: Status post right BKA. Skin: No rashes, lesions or ulcers Psychiatry: Judgement and insight appear normal. Mood & affect appropriate.    Data Reviewed:  There are no new results to review at this time.  Family Communication: None  Disposition: Status is: Inpatient Remains inpatient appropriate because: Unsafe discharge, pending placement.  Time spent: 35 minutes  Author: Murvin Mana, MD 04/19/2024 10:11 AM  For on call review [email or web address].

## 2024-04-19 NOTE — Plan of Care (Signed)

## 2024-04-20 DIAGNOSIS — I10 Essential (primary) hypertension: Secondary | ICD-10-CM | POA: Diagnosis not present

## 2024-04-20 DIAGNOSIS — Z794 Long term (current) use of insulin: Secondary | ICD-10-CM | POA: Diagnosis not present

## 2024-04-20 DIAGNOSIS — J449 Chronic obstructive pulmonary disease, unspecified: Secondary | ICD-10-CM | POA: Diagnosis not present

## 2024-04-20 DIAGNOSIS — E1140 Type 2 diabetes mellitus with diabetic neuropathy, unspecified: Secondary | ICD-10-CM | POA: Diagnosis not present

## 2024-04-20 LAB — BASIC METABOLIC PANEL WITH GFR
Anion gap: 9 (ref 5–15)
BUN: 16 mg/dL (ref 8–23)
CO2: 25 mmol/L (ref 22–32)
Calcium: 8.6 mg/dL — ABNORMAL LOW (ref 8.9–10.3)
Chloride: 100 mmol/L (ref 98–111)
Creatinine, Ser: 1.24 mg/dL (ref 0.61–1.24)
GFR, Estimated: >60 mL/min (ref >60)
Glucose, Bld: 265 mg/dL — ABNORMAL HIGH (ref 70–99)
Potassium: 3.9 mmol/L (ref 3.5–5.1)
Sodium: 134 mmol/L — ABNORMAL LOW (ref 135–145)

## 2024-04-20 LAB — GLUCOSE, CAPILLARY
Glucose-Capillary: 265 mg/dL — ABNORMAL HIGH (ref 70–99)
Glucose-Capillary: 280 mg/dL — ABNORMAL HIGH (ref 70–99)
Glucose-Capillary: 215 mg/dL — ABNORMAL HIGH (ref 70–99)
Glucose-Capillary: 182 mg/dL — ABNORMAL HIGH (ref 70–99)

## 2024-04-20 LAB — MAGNESIUM: Magnesium: 1.8 mg/dL (ref 1.7–2.4)

## 2024-04-20 LAB — PHOSPHORUS: Phosphorus: 3 mg/dL (ref 2.5–4.6)

## 2024-04-20 MED ORDER — INSULIN GLARGINE-YFGN 100 UNIT/ML ~~LOC~~ SOLN
7.0000 [IU] | Freq: Every day | SUBCUTANEOUS | Status: DC
  Administered 2024-04-20: 7 [IU] via SUBCUTANEOUS
  Filled 2024-04-20 (×2): qty 0.07

## 2024-04-20 NOTE — TOC Progression Note (Addendum)
 Transition of Care (TOC) - Progression Note    Patient Details  Name: Timothy [name]. MRN: [id number] Date of Birth: 06-Sep-1960  Transition [facility] Care [facility]) CM/SW Contact  Corean ONEIDA Haddock, RN Phone Number: 04/20/2024, 9:39 AM  Clinical Narrative:     No SNF bed offers  Patient noted now to be A&O x4 CM to follow up with patient to discuss disposition    Update:  Met with patient at bedside.  He states his place was to return to Tony's home which is the address on file.  He states that he has lived there for the last 16 months, and off and on for 5 years.  He states that it is also his mailing address.  Call placed to [facility] on speaker phone while in the room.  [name] states that patient can not return to the home, while using expletives.  He then states and hospital, dont call me any more, then disconnected call     Call placed to [facility] who is emergency contact, and patient has lived with before.  She states its not an option for the patient to come and stay with her at this time.  She states she is going to call Ludie (her ex step son) to see if he can convince [name] to allow the patient to come back and stay  CM supervisor updated     Barriers to Discharge: Continued Medical Work up, Other (must enter comment) (insurance authorization, alcohol use)               Expected Discharge Plan and Services   Discharge Planning Services: CM Consult   Living arrangements for the past 2 months: Single Family Home                                       Social Drivers of Health (SDOH) Interventions SDOH Screenings   Food Insecurity: Patient Unable To Answer (04/11/2024)  Housing: Patient Unable To Answer (04/11/2024)  Transportation Needs: Patient Unable To Answer (04/11/2024)  Utilities: Patient Unable To Answer (04/11/2024)  Alcohol Screen: Low Risk  (10/08/2023)  Depression (PHQ2-9): High Risk (10/03/2022)  Financial Resource Strain: Medium Risk (05/28/2022)   Physical Activity: Inactive (05/28/2022)  Social Connections: Moderately Isolated (10/07/2023)  Stress: Stress Concern Present (05/28/2022)  Tobacco Use: High Risk (04/10/2024)    Readmission Risk Interventions    11/06/2022   12:06 PM  Readmission Risk Prevention Plan  Transportation Screening Complete  PCP or Specialist Appt within 3-5 Days Complete  Social Work Consult for Recovery Care Planning/Counseling Complete  Palliative Care Screening Not Applicable  Medication Review Oceanographer) Referral to Pharmacy

## 2024-04-20 NOTE — Plan of Care (Signed)

## 2024-04-20 NOTE — Progress Notes (Signed)
 Progress Note   Patient: Timothy [name]. FMW:[id number] DOB: 26-Jan-1961 DOA: 04/10/2024     10 DOS: the patient was seen and examined on 04/20/2024   Brief hospital course: Patient is a 63 year old male with past medical history of heavy alcohol abuse, CVA, hypertension, hyperlipidemia, COPD, major depression, bipolar disorder, anxiety was brought into the hospital by EMS after he was found to be in the floor at his home. Someone came to check on him at his house and was noted to be on the floor and had altered mental status. There was no mention of how long he was on the floor. He was last seen 2 days prior to this presentation. EMS noted that he had multiple empty alcohol bottles on his floor    Principal Problem:   Traumatic rhabdomyolysis Active Problems:   Hypertension   Type 2 diabetes mellitus with diabetic neuropathy, unspecified (HCC)   Depression   COPD (chronic obstructive pulmonary disease) (HCC)   Dyslipidemia (high LDL; low HDL)   Fall at home, initial encounter   Hypokalemia   Hypophosphatemia   Hypomagnesemia   Assessment and Plan:  COPD. Feeling well complaining some short of breath last night, no wheezing. BNP was normal, chest x-ray did not show acute changes.   Patient is now is saying that he has been having short of breath for months, does not seem to have any bronchospasm.  I have scheduled DuoNeb, continue Breo.  Shortness of breath much improved. Condition continue to improve.   Fall and found on the floor.    Rhabdomyolysis secondary to fall. Frequent falls secondary to alcohol abuse. Patient condition has improved, PT/OT recommending nursing home placement.  Currently pending placement.   Hypokalemia Hypomag Hypophos Acute kidney injury Check labs tomorrow.   Alcohol abuse with tremors on presentation.   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  Most likely has dementia.  Condition  stable.   Essential hypertension. --cont Toprol    History of CVA  --cont plavix    History of major depressive disorder, bipolar disorder/anxiety Continue Abilify  and Neurontin .   Diabetes mellitus type 2 uncontrolled with hyperglycemia.. --A1c 8.1 Glucose running high, increase insulin  glargine.   Pressure injury left ankle stage 3, present on admission  Continue to monitor.        Subjective:  Patient no longer has been short of breath.  Physical Exam: Vitals:   04/20/24 0436 04/20/24 0500 04/20/24 0800 04/20/24 0815  BP: 127/74 127/74 (!) 113/56 120/74  Pulse: (!) 58 (!) 58 62 64  Resp: 16  17 16   Temp: 97.8 F (36.6 C)  98.1 F (36.7 C) 98.9 F (37.2 C)  TempSrc:   Oral Oral  SpO2: 98%  98% 98%  Weight:      Height:       General exam: Appears calm and comfortable  Respiratory system: Decreased breath sounds. Respiratory effort normal. Cardiovascular system: S1 & S2 heard, RRR. No JVD, murmurs, rubs, gallops or clicks. No pedal edema. Gastrointestinal system: Abdomen is nondistended, soft and nontender. No organomegaly or masses felt. Normal bowel sounds heard. Central nervous system: Alert and oriented. No focal neurological deficits. Extremities: Symmetric 5 x 5 power. Skin: No rashes, lesions or ulcers Psychiatry: Judgement and insight appear normal. Mood & affect appropriate.    Data Reviewed:  Lab results reviewed.  Family Communication: None  Disposition: Status is: Inpatient Remains inpatient appropriate because: Unsafe discharge, pending placement  Time spent: 35 minutes  Author: Murvin Mana, MD 04/20/2024 12:23 PM  For on call review [email or web address].

## 2024-04-20 NOTE — Inpatient Diabetes Management (Signed)
 Inpatient Diabetes Program Recommendations  AACE/ADA: New Consensus Statement on Inpatient Glycemic Control   Target Ranges:  Prepandial:   less than 140 mg/dL      Peak postprandial:   less than 180 mg/dL (1-2 hours)      Critically ill patients:  140 - 180 mg/dL    Latest Reference Range & Units 04/19/24 08:06 04/19/24 12:12 04/19/24 18:17 04/19/24 21:17  Glucose-Capillary 70 - 99 mg/dL 809 (H) 830 (H) 717 (H) 188 (H)   Review of Glycemic Control  Current orders for Inpatient glycemic control: Semglee  5 units at bedtime, Novolog  0-15 units TID with meals, Novolog  0-5 units QHS  Inpatient Diabetes Program Recommendations:    Insulin : Please consider increasing Semglee  to 7 units at bedtime.  Thanks, Earnie Gainer, RN, MSN, CDCES Diabetes Coordinator Inpatient Diabetes Program [phone] (Team Pager from 8am to 5pm)

## 2024-04-20 NOTE — Progress Notes (Signed)
 Occupational Therapy Treatment Patient Details Name: Timothy [name]. MRN: [id number] DOB: Dec 01, 1960 Today's Date: 04/20/2024   History of present illness Pt is a 63 y/o male who presented to ED on 04/10/24 after being found on floor by a bystander with AMS. PMH significant for R BKA, tobacco use, type 2 diabetes, HTN, HLD, COPD, PTSD, bipolar disorder, anxiety, CVA   OT comments  Timothy [name] was seen for OT treatment on this date. Upon arrival to room pt in bed, agreeable to tx. Pt requires SUPERVISION + RW for ADL t/f ~15 ft. MOD I + RW for bed>chair t/f. SBA static standing and functional reaching task. Pt making good progress toward goals, will continue to follow POC. Pt appears near functional baseline. Discharge recommendation updated to reflect pt progress.       If plan is discharge home, recommend the following:  Assist for transportation;Help with stairs or ramp for entrance;Assistance with cooking/housework   Equipment Recommendations  None recommended by OT    Recommendations for Other Services      Precautions / Restrictions Precautions Precautions: Fall Recall of Precautions/Restrictions: Intact Restrictions Weight Bearing Restrictions Per Provider Order: No Other Position/Activity Restrictions: R BKA does not use a prosthetic       Mobility Bed Mobility Overal bed mobility: Modified Independent                  Transfers Overall transfer level: Needs assistance Equipment used: Rolling walker (2 wheels) Transfers: Sit to/from Stand Sit to Stand: Supervision           General transfer comment: pt endorses being close to his baseline     Balance Overall balance assessment: Needs assistance Sitting-balance support: Feet supported Sitting balance-Leahy Scale: Normal     Standing balance support: Single extremity supported, During functional activity Standing balance-Leahy Scale: Fair                             ADL either  performed or assessed with clinical judgement   ADL Overall ADL's : Needs assistance/impaired                                       General ADL Comments: SUPERVISION + RW for ADL t/f ~15 ft. SBA static standing and functional reaching task.     Communication Communication Communication: No apparent difficulties   Cognition Arousal: Alert Behavior During Therapy: WFL for tasks assessed/performed Cognition: No family/caregiver present to determine baseline             OT - Cognition Comments: A&O x4, good insight into safety with transfers                 Following commands: Intact        Cueing   Cueing Techniques: Verbal cues  Exercises      Shoulder Instructions       General Comments      Pertinent Vitals/ Pain       Pain Assessment Pain Assessment: No/denies pain   Frequency  Min 2X/week        Progress Toward Goals  OT Goals(current goals can now be found in the care plan section)  Progress towards OT goals: Progressing toward goals  Acute Rehab OT Goals OT Goal Formulation: With patient Time For Goal Achievement: 04/25/24 Potential to Achieve Goals: Good ADL Goals Pt  Will Perform Grooming: sitting;with set-up;with supervision Pt Will Perform Lower Body Dressing: sitting/lateral leans;with supervision;with set-up;with adaptive equipment Pt Will Transfer to Toilet: squat pivot transfer;bedside commode;with supervision;with set-up Pt Will Perform Toileting - Clothing Manipulation and hygiene: with set-up;with supervision;with adaptive equipment;sitting/lateral leans  Plan      Co-evaluation        PT goals addressed during session: Mobility/safety with mobility;Balance;Proper use of DME;Strengthening/ROM        AM-PAC OT 6 Clicks Daily Activity     Outcome Measure   Help from another person eating meals?: None Help from another person taking care of personal grooming?: A Little Help from another person  toileting, which includes using toliet, bedpan, or urinal?: A Little Help from another person bathing (including washing, rinsing, drying)?: A Little Help from another person to put on and taking off regular upper body clothing?: None Help from another person to put on and taking off regular lower body clothing?: A Little 6 Click Score: 20    End of Session Equipment Utilized During Treatment: Rolling walker (2 wheels)  OT Visit Diagnosis: Other abnormalities of gait and mobility (R26.89);Muscle weakness (generalized) (M62.81);Other symptoms and signs involving cognitive function   Activity Tolerance Patient tolerated treatment well   Patient Left in bed;with call bell/phone within reach;with bed alarm set   Nurse Communication          Time: 8545-8494 OT Time Calculation (min): 11 min  Charges: OT General Charges $OT Visit: 1 Visit OT Treatments $Self Care/Home Management : 8-22 mins  Timothy [name], M.S. OTR/L  04/20/24, 3:17 PM  ascom [phone]

## 2024-04-20 NOTE — Progress Notes (Signed)
 Physical Therapy Treatment Patient Details Name: Timothy [name]. MRN: [id number] DOB: 1960-10-31 Today's Date: 04/20/2024   History of Present Illness Pt is a 63 y/o male who presented to ED on 04/10/24 after being found on floor by a bystander with AMS. PMH significant for R BKA, tobacco use, type 2 diabetes, HTN, HLD, COPD, PTSD, bipolar disorder, anxiety, CVA    PT Comments  Pt was long sitting in bed upon arrival. Agreeable to session with a little encouragement. Pt's cognition is much improved form last session observed. He is oriented x 4. States he would only stand pivot at baseline to w/c and self propel w/c around home. No physical assistance required to exit bed. Pt endorses getting up to [facility] two times today without assist. Acute PT will continue to follow per current POC.    If plan is discharge home, recommend the following: Help with stairs or ramp for entrance;Assist for transportation;Assistance with cooking/housework;A little help with walking and/or transfers     Equipment Recommendations  Rolling walker (2 wheels)       Precautions / Restrictions Precautions Precautions: Fall Recall of Precautions/Restrictions: Intact Restrictions Weight Bearing Restrictions Per Provider Order: No Other Position/Activity Restrictions: R BKA does not use a prosthetic pr want one     Mobility  Bed Mobility Overal bed mobility: Needs Assistance Bed Mobility: Supine to Sit, Sit to Supine  Supine to sit: Supervision Sit to supine: Supervision   Transfers Overall transfer level: Needs assistance Equipment used: Rolling walker (2 wheels) Transfers: Sit to/from Stand Sit to Stand: Supervision  General transfer comment: pt endorses being close to his baseline. Asked if he ambulate with RW however he states he usually just stand pivots to w/c and wheels in w/c most places.    Ambulation/Gait Ambulation/Gait assistance: Contact guard assist Gait Distance (Feet): 2 Feet Assistive  device: Rolling walker (2 wheels) Gait Pattern/deviations: Step-to pattern Gait velocity: decreased  General Gait Details: Pt is able to hop however does not perform formal ambulation prior to arrival.   Balance Overall balance assessment: Needs assistance Sitting-balance support: Feet supported, No upper extremity supported, Feet unsupported Sitting balance-Leahy Scale: Good     Standing balance support: Bilateral upper extremity supported, Reliant on assistive device for balance Standing balance-Leahy Scale: Fair Standing balance comment: Reliant on BUE support during all standing however with BUE support, no LOB observed    Communication Communication Communication: No apparent difficulties  Cognition Arousal: Alert Behavior During Therapy: WFL for tasks assessed/performed   PT - Cognitive impairments: No family/caregiver present to determine baseline    PT - Cognition Comments: Pt is A and O x 4 Following commands: Intact      Cueing Cueing Techniques: Verbal cues         Pertinent Vitals/Pain Pain Assessment Pain Assessment: No/denies pain Pain Score: 0-No pain     PT Goals (current goals can now be found in the care plan section) Acute Rehab PT Goals Patient Stated Goal: get my wallet before my roomate steals it Progress towards PT goals: Progressing toward goals    Frequency    Min 2X/week           Co-evaluation     PT goals addressed during session: Mobility/safety with mobility;Balance;Proper use of DME;Strengthening/ROM        AM-PAC PT 6 Clicks Mobility   Outcome Measure  Help needed turning from your back to your side while in a flat bed without using bedrails?: A Little Help needed moving  from lying on your back to sitting on the side of a flat bed without using bedrails?: A Little Help needed moving to and from a bed to a chair (including a wheelchair)?: A Little Help needed standing up from a chair using your arms (e.g., wheelchair  or bedside chair)?: A Little Help needed to walk in hospital room?: A Little Help needed climbing 3-5 steps with a railing? : A Lot 6 Click Score: 17    End of Session   Activity Tolerance: Patient tolerated treatment well Patient left: in bed;with call bell/phone within reach;with bed alarm set Nurse Communication: Mobility status PT Visit Diagnosis: Unsteadiness on feet (R26.81);Muscle weakness (generalized) (M62.81);Other abnormalities of gait and mobility (R26.89)     Time: 8653-8644 PT Time Calculation (min) (ACUTE ONLY): 9 min  Charges:    $Therapeutic Activity: 8-22 mins PT General Charges $$ ACUTE PT VISIT: 1 Visit                     Rankin Essex PTA 04/20/24, 2:50 PM

## 2024-04-21 DIAGNOSIS — T796XXA Traumatic ischemia of muscle, initial encounter: Secondary | ICD-10-CM | POA: Diagnosis not present

## 2024-04-21 DIAGNOSIS — J449 Chronic obstructive pulmonary disease, unspecified: Secondary | ICD-10-CM | POA: Diagnosis not present

## 2024-04-21 DIAGNOSIS — I10 Essential (primary) hypertension: Secondary | ICD-10-CM | POA: Diagnosis not present

## 2024-04-21 LAB — GLUCOSE, CAPILLARY
Glucose-Capillary: 282 mg/dL — ABNORMAL HIGH (ref 70–99)
Glucose-Capillary: 242 mg/dL — ABNORMAL HIGH (ref 70–99)
Glucose-Capillary: 286 mg/dL — ABNORMAL HIGH (ref 70–99)
Glucose-Capillary: 255 mg/dL — ABNORMAL HIGH (ref 70–99)

## 2024-04-21 MED ORDER — ACETAMINOPHEN 325 MG PO TABS
650.0000 mg | ORAL_TABLET | ORAL | Status: DC | PRN
  Administered 2024-04-21: 650 mg via ORAL
  Filled 2024-04-21: qty 2

## 2024-04-21 MED ORDER — MORPHINE SULFATE (PF) 2 MG/ML IV SOLN: 2.0000 mg | INTRAVENOUS | Status: DC | PRN

## 2024-04-21 MED ORDER — INSULIN GLARGINE-YFGN 100 UNIT/ML ~~LOC~~ SOLN
10.0000 [IU] | Freq: Every day | SUBCUTANEOUS | Status: DC
  Administered 2024-04-21: 10 [IU] via SUBCUTANEOUS
  Filled 2024-04-21 (×2): qty 0.1

## 2024-04-21 MED ORDER — HYDROXYZINE HCL 25 MG PO TABS
25.0000 mg | ORAL_TABLET | Freq: Three times a day (TID) | ORAL | Status: DC | PRN
  Administered 2024-04-21 – 2024-04-22 (×3): 25 mg via ORAL
  Filled 2024-04-21 (×3): qty 1

## 2024-04-21 NOTE — Plan of Care (Signed)

## 2024-04-21 NOTE — Progress Notes (Signed)
 Mobility Specialist Progress Note:    04/21/24 1537  Mobility  Activity Stood at bedside;Pivoted/transferred to/from [facility]  Level of Assistance Standby assist, set-up cues, supervision of patient - no hands on  Assistive Device BSC  Activity Response Tolerated well  Mobility visit 1 Mobility  Mobility Specialist Start Time (ACUTE ONLY) 1528  Mobility Specialist Stop Time (ACUTE ONLY) 1532  Mobility Specialist Time Calculation (min) (ACUTE ONLY) 4 min   Assisted pt to [facility], required supervision to stand and transfer. Tolerated well, asx throughout. Left pt on [facility] with call bell, notified nursing. All needs met.  Sherrilee Ditty Mobility Specialist Please contact via Special Educational Needs Teacher or  Rehab office at [phone]

## 2024-04-21 NOTE — TOC Progression Note (Signed)
 Transition of Care (TOC) - Progression Note    Patient Details  Name: Timothy [name]. MRN: [id number] Date of Birth: May 01, 1961  Transition of Care [facility]) CM/SW Contact  Corean ONEIDA Haddock, RN Phone Number: 04/21/2024, 9:42 AM  Clinical Narrative:     [name] with [name] and [name] [name] this morning.  At this time [name] still states that patient can not return to his home,  Per [name] [name] is to look to see if she can find the patient's wallet which has his debit card and ID. [name] [name] is going to check with her daughter [name] to see if she is able to bring the patient's WC to the hospital     Barriers to Discharge: Continued Medical Work up, Other (must enter comment) (insurance authorization, alcohol use)               Expected Discharge Plan and Services   Discharge Planning Services: CM Consult   Living arrangements for the past 2 months: Single Family Home                                       Social Drivers of Health (SDOH) Interventions SDOH Screenings   Food Insecurity: Patient Unable To Answer (04/11/2024)  Housing: Patient Unable To Answer (04/11/2024)  Transportation Needs: Patient Unable To Answer (04/11/2024)  Utilities: Patient Unable To Answer (04/11/2024)  Alcohol Screen: Low Risk  (10/08/2023)  Depression (PHQ2-9): High Risk (10/03/2022)  Financial Resource Strain: Medium Risk (05/28/2022)  Physical Activity: Inactive (05/28/2022)  Social Connections: Moderately Isolated (10/07/2023)  Stress: Stress Concern Present (05/28/2022)  Tobacco Use: High Risk (04/10/2024)    Readmission Risk Interventions    11/06/2022   12:06 PM  Readmission Risk Prevention Plan  Transportation Screening Complete  PCP or Specialist Appt within 3-5 Days Complete  Social Work Consult for Recovery Care Planning/Counseling Complete  Palliative Care Screening Not Applicable  Medication Review Oceanographer) Referral to Pharmacy

## 2024-04-21 NOTE — Progress Notes (Addendum)
 Progress Note   Patient: Timothy [name]. FMW:[id number] DOB: September 25, 1960 DOA: 04/10/2024     11 DOS: the patient was seen and examined on 04/21/2024   Brief hospital course: Patient is a 63 year old male with past medical history of heavy alcohol abuse, CVA, hypertension, hyperlipidemia, COPD, major depression, bipolar disorder, anxiety was brought into the hospital by EMS after he was found to be in the floor at his home. Someone came to check on him at his house and was noted to be on the floor and had altered mental status. There was no mention of how long he was on the floor. He was last seen 2 days prior to this presentation. EMS noted that he had multiple empty alcohol bottles on his floor    Principal Problem:   Traumatic rhabdomyolysis Active Problems:   Hypertension   Type 2 diabetes mellitus with diabetic neuropathy, unspecified (HCC)   Depression   COPD (chronic obstructive pulmonary disease) (HCC)   Dyslipidemia (high LDL; low HDL)   Fall at home, initial encounter   Hypokalemia   Hypophosphatemia   Hypomagnesemia   Assessment and Plan: COPD. Feeling well complaining some short of breath last night, no wheezing. BNP was normal, chest x-ray did not show acute changes.   Patient is now is saying that he has been having short of breath for months, does not seem to have any bronchospasm.  He was treated with scheduled DuoNeb and acting bronchodilator.  Condition improved, changed short acting bronchodilator to as needed.  Fall and found on the floor.    Rhabdomyolysis secondary to fall. Frequent falls secondary to alcohol abuse. Right BKA. Patient condition has improved.  TOC is working on discharge options.   Hypokalemia Hypomag Hypophos Acute kidney injury Hyponatremia. Condition all improved.  Currently has a mild hyponatremia.   Alcohol abuse with tremors on presentation.   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  Most likely has dementia.  Condition stable.   Essential hypertension. --cont Toprol    History of CVA  --cont plavix    History of major depressive disorder, bipolar disorder/anxiety Continue Abilify  and Neurontin .   Diabetes mellitus type 2 uncontrolled with hyperglycemia.. --A1c 8.1 Glucose running high, increase dose of insulin  glargine.   Pressure injury left ankle stage 3, present on admission  Continue to monitor.   Suicide ideation. Patient voiced suicidal ideation, will consult psychiatry     Subjective:  Patient doing well today, no short of breath.  Physical Exam: Vitals:   04/20/24 1953 04/21/24 0331 04/21/24 0500 04/21/24 0821  BP:  (!) 97/51  (!) 92/45  Pulse:  62  (!) 59  Resp:  18  18  Temp:  98.6 F (37 C)  98.3 F (36.8 C)  TempSrc:    Oral  SpO2: 99% 99%  97%  Weight:   78.6 kg   Height:       General exam: Appears calm and comfortable  Respiratory system: decreased breath sounds. Respiratory effort normal. Cardiovascular system: S1 & S2 heard, RRR. No JVD, murmurs, rubs, gallops or clicks. No pedal edema. Gastrointestinal system: Abdomen is nondistended, soft and nontender. No organomegaly or masses felt. Normal bowel sounds heard. Central nervous system: Alert and oriented. No focal neurological deficits. Extremities: Right BKA. Skin: No rashes, lesions or ulcers Psychiatry: Judgement and insight appear normal. Mood & affect appropriate.    Data Reviewed:  There are no new results to review at this time.  Family Communication: None  Disposition: Status is: Inpatient Remains inpatient appropriate because: Unsafe discharge pending placement     Time spent: 35 minutes  Author: Murvin Mana, MD 04/21/2024 1:21 PM  For on call review [email or web address]

## 2024-04-22 ENCOUNTER — Other Ambulatory Visit: Payer: Self-pay

## 2024-04-22 DIAGNOSIS — I10 Essential (primary) hypertension: Secondary | ICD-10-CM | POA: Diagnosis not present

## 2024-04-22 DIAGNOSIS — F1010 Alcohol abuse, uncomplicated: Secondary | ICD-10-CM | POA: Diagnosis not present

## 2024-04-22 DIAGNOSIS — T796XXA Traumatic ischemia of muscle, initial encounter: Secondary | ICD-10-CM | POA: Diagnosis not present

## 2024-04-22 DIAGNOSIS — E785 Hyperlipidemia, unspecified: Secondary | ICD-10-CM

## 2024-04-22 DIAGNOSIS — F32A Depression, unspecified: Secondary | ICD-10-CM

## 2024-04-22 DIAGNOSIS — E876 Hypokalemia: Secondary | ICD-10-CM

## 2024-04-22 DIAGNOSIS — E1140 Type 2 diabetes mellitus with diabetic neuropathy, unspecified: Secondary | ICD-10-CM | POA: Diagnosis not present

## 2024-04-22 LAB — GLUCOSE, CAPILLARY
Glucose-Capillary: 352 mg/dL — ABNORMAL HIGH (ref 70–99)
Glucose-Capillary: 257 mg/dL — ABNORMAL HIGH (ref 70–99)

## 2024-04-22 MED ORDER — ARIPIPRAZOLE 5 MG PO TABS
5.0000 mg | ORAL_TABLET | Freq: Two times a day (BID) | ORAL | 11 refills | Status: AC
  Filled 2024-04-22: qty 60, 30d supply, fill #0

## 2024-04-22 MED ORDER — CLOPIDOGREL BISULFATE 75 MG PO TABS
75.0000 mg | ORAL_TABLET | Freq: Every day | ORAL | 11 refills | Status: AC
  Filled 2024-04-22: qty 30, 30d supply, fill #0

## 2024-04-22 MED ORDER — VENLAFAXINE HCL ER 150 MG PO CP24
150.0000 mg | ORAL_CAPSULE | Freq: Every day | ORAL | 2 refills | Status: AC
  Filled 2024-04-22: qty 30, 30d supply, fill #0

## 2024-04-22 MED ORDER — FLUTICASONE-SALMETEROL 100-50 MCG/ACT IN AEPB
1.0000 | INHALATION_SPRAY | Freq: Two times a day (BID) | RESPIRATORY_TRACT | 0 refills | Status: AC
  Filled 2024-04-22: qty 1, fill #0
  Filled 2024-04-22: qty 60, 30d supply, fill #0

## 2024-04-22 MED ORDER — INSUPEN PEN NEEDLES 31G X 5 MM MISC
0 refills | Status: AC
  Filled 2024-04-22: qty 100, 90d supply, fill #0

## 2024-04-22 MED ORDER — VITAMIN D (ERGOCALCIFEROL) 1.25 MG (50000 UNIT) PO CAPS
50000.0000 [IU] | ORAL_CAPSULE | ORAL | 0 refills | Status: AC
  Filled 2024-04-22: qty 12, 84d supply, fill #0

## 2024-04-22 MED ORDER — PANTOPRAZOLE SODIUM 40 MG PO TBEC
40.0000 mg | DELAYED_RELEASE_TABLET | Freq: Every day | ORAL | 2 refills | Status: AC
  Filled 2024-04-22: qty 30, 30d supply, fill #0

## 2024-04-22 MED ORDER — VITAMIN D (ERGOCALCIFEROL) 1.25 MG (50000 UNIT) PO CAPS
50000.0000 [IU] | ORAL_CAPSULE | ORAL | Status: DC
Start: 2024-04-22 — End: 2024-04-22
  Administered 2024-04-22: 50000 [IU] via ORAL
  Filled 2024-04-22: qty 1

## 2024-04-22 MED ORDER — METOPROLOL SUCCINATE ER 50 MG PO TB24
50.0000 mg | ORAL_TABLET | Freq: Every day | ORAL | 11 refills | Status: AC
  Filled 2024-04-22: qty 30, 30d supply, fill #0

## 2024-04-22 MED ORDER — VITAMIN B-1 100 MG PO TABS
100.0000 mg | ORAL_TABLET | Freq: Every day | ORAL | 0 refills | Status: AC
  Filled 2024-04-22: qty 30, 30d supply, fill #0

## 2024-04-22 MED ORDER — ALBUTEROL SULFATE (2.5 MG/3ML) 0.083% IN NEBU
2.5000 mg | INHALATION_SOLUTION | RESPIRATORY_TRACT | 12 refills | Status: AC | PRN
  Filled 2024-04-22: qty 75, 3d supply, fill #0

## 2024-04-22 MED ORDER — INSULIN LISPRO (1 UNIT DIAL) 100 UNIT/ML (KWIKPEN)
3.0000 [IU] | PEN_INJECTOR | Freq: Three times a day (TID) | SUBCUTANEOUS | 11 refills | Status: AC
  Filled 2024-04-22: qty 6, 67d supply, fill #0

## 2024-04-22 MED ORDER — GABAPENTIN 400 MG PO CAPS
400.0000 mg | ORAL_CAPSULE | Freq: Three times a day (TID) | ORAL | 2 refills | Status: AC
  Filled 2024-04-22: qty 90, 30d supply, fill #0

## 2024-04-22 MED ORDER — ROSUVASTATIN CALCIUM 5 MG PO TABS
5.0000 mg | ORAL_TABLET | Freq: Every day | ORAL | 11 refills | Status: AC
  Filled 2024-04-22: qty 30, 30d supply, fill #0

## 2024-04-22 NOTE — Plan of Care (Signed)

## 2024-04-22 NOTE — Consult Note (Addendum)
 [facility] Psychiatric Consult Initial  Patient Name: .Timothy [name].  MRN: [id number]  DOB: 1961/01/10  Consult Order details:  Orders (From admission, onward)     Start     Ordered   04/21/24 1619  IP CONSULT TO PSYCHIATRY       Ordering Provider: Laurita Pillion, MD  Provider:  (Not yet assigned)  Question Answer Comment  Location [facility]   Reason for Consult? suicide ideation      04/21/24 1619             Mode of Visit: In person    Psychiatry Consult Evaluation  Service Date: April 22, 2024 LOS:  LOS: 12 days  Chief Complaint  I said I was suicidal for attention because I did not feel like they were listening to me  Primary Psychiatric Diagnoses  Hx of depression   Assessment  Timothy [name]. is a 63 y.o. male admitted: Medically  Psychiatry was consulted by medical team due to patient making suicidal statements to nursing staff.  On assessment today, patient reported making suicidal statements for attention, because I did not feel like they were listening to me.  Patient reported complex social history and a lot of social stressors.  He reported the current place that he is residing is threatening to evict him and not let him get his belongings.  He reported currently residing with someone who he considered to be a friend.  He did report limitations of where he can live due to legal restrictions due to being on the offender registry.  He reported being on this registry due to false accusations, and this has been a life stressor as well.  Patient denied any current suicidal or homicidal ideation.  Patient denied any auditory or visual hallucinations as well.On current presentation there was no evidence of psychosis or mania and patient did not appear to be responding to internal stimuli. At this time, patient does not appear to be a risk to self or others.  Patient was able to contract for safety.  Patient reported his main concern  was getting assistance with disability check and help with his other social factors.  Social work has been involved heavily with patient case and working closely with patient to aligned with appropriate resources.While future psychiatric events cannot be accurately predicted, the patient does not currently require acute inpatient psychiatric care and does not currently meet [place]  involuntary commitment criteria.  Patient will be provided with follow-up resources for outpatient care.  Patient reported that [name] in [place] would be the closest most appropriate place for him to follow-up.  We discussed that [name] would be able to provide mental health resources as well as substance use resources.  This will be provided to patient in AVS details.  Diagnoses:  Active Hospital problems: Principal Problem:   Traumatic rhabdomyolysis Active Problems:   Hypertension   Type 2 diabetes mellitus with diabetic neuropathy, unspecified (HCC)   COPD (chronic obstructive pulmonary disease) (HCC)   Fall at home, initial encounter   Depression   Dyslipidemia (high LDL; low HDL)   Hypokalemia   Hypophosphatemia   Hypomagnesemia    Plan   ## Psychiatric Medication Recommendations:  Continue prescribed Abilify -no changes made at this time  ## Medical Decision Making Capacity: Not specifically addressed in this encounter  ## Further Work-up:   -- most recent EKG on 04/10/2024 had QtC of 508 -- Pertinent labwork reviewed earlier this admission includes: bmp, cbc   ##  Disposition:-- There are no psychiatric contraindications to discharge at this time  ## Behavioral / Environmental: - No specific recommendations at this time.     ## Safety and Observation Level:  - Based on my clinical evaluation, I estimate the patient to be at low risk of self harm in the current setting. - At this time, we recommend  routine. This decision is based on my review of the chart including patient's history and  current presentation, interview of the patient, mental status examination, and consideration of suicide risk including evaluating suicidal ideation, plan, intent, suicidal or self-harm behaviors, risk factors, and protective factors. This judgment is based on our ability to directly address suicide risk, implement suicide prevention strategies, and develop a safety plan while the patient is in the clinical setting. Please contact our team if there is a concern that risk level has changed.  CSSR Risk Category:C-SSRS RISK CATEGORY: Low Risk  Suicide Risk Assessment: Patient has following modifiable risk factors for suicide: lack of access to outpatient mental health resources, which we are addressing by providing patient with follow up outpatient resources. Patient has following non-modifiable or demographic risk factors for suicide: male gender and history of suicide attempt Patient has the following protective factors against suicide: Supportive friends  Thank you for this consult request. Recommendations have been communicated to the primary team.  We will sign off at this time.   Zelda Sharps, NP    History of Present Illness  Relevant Aspects of [facility]  Patient Report:  On assessment today, patient reported making suicidal statements for attention, because I did not feel like they were listening to me.  Patient reported complex social history and a lot of social stressors.  He reported the current place that he is residing is threatening to evict him and not let him get his belongings.  He reported currently residing with someone who he considered to be a friend.  He did report limitations of where he can live due to legal restrictions due to being on the offender registry.  He reported being on this registry due to false accusations, and this has been a life stressor as well.  Patient denied any current suicidal or homicidal ideation.  Patient denied any auditory or visual  hallucinations as well.On current presentation there was no evidence of psychosis or mania and patient did not appear to be responding to internal stimuli. At this time, patient does not appear to be a risk to self or others.  Patient was able to contract for safety.  Patient reported his main concern was getting assistance with disability checks and help with his other social factors.  Social work has been involved heavily with patient case and working closely with patient to aligned with appropriate resources.While future psychiatric events cannot be accurately predicted, the patient does not currently require acute inpatient psychiatric care and does not currently meet [place]  involuntary commitment criteria.  Patient will be provided with follow-up resources for outpatient care.  Patient reported that [name] in [place] would be the closest most appropriate place for him to follow-up.  This will be provided to patient in AVS details.  Patient reported being stressed, but adamantly denied any suicidal ideation or intent.  As stated above, he reported making the statements for attention and reported he did not feel like he was getting the help that he needed in regards to social aspects.  He reported stressful living situation as well as the people he is currently staying  with are telling him that they are taking him out.  He reported however that he was not served with eviction papers and that he had been residing at this residence for about 7 years off and on.  Social work is working closely with patient in regards to this.  Patient denied any access to any weapons.  He he reported fear of having to return to jail if he was not able to return to the house that he was previously staying at due to legal requirements with his previous charges.  Patient did report if prescriptions for medications to be provided prior to discharge that this would be very helpful for him as he is getting things settled.   This was relayed to medical team and social work by this provider.  Patient reported being compliant with his current Abilify , but did report financial stressors as he is not currently receiving disability checks, as they are being sent to his previous residence he was staying at prior to hospitalization.   Social work is also working closely with patient regarding this as well.  Patient denied any known side effects to current Abilify .  Patient was agreeable to following up with outpatient resources.  We discussed following up with [name] as this would be the closest most reasonable place for patient to follow-up with.  Patient was agreeable to doing so and information for follow-up care placed in patient AVS information.    Psych ROS:  Depression: Related to social stressors Anxiety:  Related to social stressors Mania (lifetime and current): Reported diagnosed bipolar recently, denied any recent mania or hospitalizations Psychosis: (lifetime and current): Denied      Psychiatric and Social History  Psychiatric History:  Information collected from Patient/chart review  Prev Dx/Sx: Bipolar disorder Current Psych Provider: Denied current Home Meds (current): Abilify  Previous Med Trials: Unknown Therapy: Denied current- we are providing follow up with [name]  Prior Psych Hospitalization: Reported yes a few years ago  Prior Self Harm: Yes- reported few years ago when first convicted of charges- no recent attempts Prior Violence: Denied  Family Psych History: Reported family history of bipolar disorder in sister and aunt Family Hx suicide: Reported his sister had attempted suicide in the past  Social History:   Educational Hx: Unknown Occupational Hx: Receives disability Legal Hx: Currently required to register on sex offender list until 2029 per patient report Living Situation: With friends Spiritual Hx: Unknown Access to weapons/lethal means: Denied  Substance History Alcohol:  Yes History of alcohol withdrawal seizures denied History of DT's denied Tobacco: Cigarettes Illicit drugs: Denied Prescription drug abuse: Denied Rehab hx: Denied  Exam Findings  Physical Exam: Deferred to hospitalist -note reviewed   Vital Signs:  Temp:  [98.2 F (36.8 C)-98.7 F (37.1 C)] 98.2 F (36.8 C) (11/05 0717) Pulse Rate:  [61-68] 64 (11/05 0717) Resp:  [16-18] 16 (11/05 0717) BP: (103-117)/(62-67) 117/66 (11/05 0717) SpO2:  [96 %-98 %] 97 % (11/05 0717) Blood pressure 117/66, pulse 64, temperature 98.2 F (36.8 C), temperature source Oral, resp. rate 16, height 5' 10 (1.778 m), weight 78.6 kg, SpO2 97%. Body mass index is 24.86 kg/m.    Mental Status Exam: General Appearance: Casual  Orientation:  Full (Time, Place, and Person)  Memory:  Immediate;   Good Recent;   Poor Remote;   Good  Concentration:  Concentration: Good  Recall:  Fair  Attention  Good  Eye Contact:  Good  Speech:  Clear and Coherent  Language:  Good  Volume:  Normal  Mood: Stressed  Affect:  Congruent  Thought Process:  Coherent, Goal Directed, and Linear  Thought Content:  Logical  Suicidal Thoughts:  No  Homicidal Thoughts:  No  Judgement:  Fair  Insight:  Fair  Psychomotor Activity:  Normal  Akathisia:  No  Fund of Knowledge:  Fair      Assets:  Manufacturing Systems Engineer Housing  Cognition:  WNL  ADL's:  Impaired  AIMS (if indicated):        Other History   These have been pulled in through the EMR, reviewed, and updated if appropriate.  Family History:  The patient's Family history is unknown by patient.  Medical History: Past Medical History:  Diagnosis Date   Anxiety    CHF (congestive heart failure) (HCC)    COPD (chronic obstructive pulmonary disease) (HCC)    Depression    Diabetes mellitus without complication (HCC)    GERD (gastroesophageal reflux disease)    Hypercholesteremia    Hypertension    Pneumonia    PTSD (post-traumatic stress disorder)      Surgical History: Past Surgical History:  Procedure Laterality Date   AMPUTATION Left 03/22/2020   Procedure: AMPUTATION RAY-1st Ray Left Foot;  Surgeon: Neill Boas, [name];  Location: [facility];  Service: Podiatry;  Laterality: Left;   AMPUTATION Right 11/07/2022   Procedure: AMPUTATION BELOW KNEE;  Surgeon: Marea Selinda RAMAN, MD;  Location: [facility];  Service: Vascular;  Laterality: Right;   AMPUTATION TOE Left 03/16/2020   Procedure: AMPUTATION GREAT TOE;  Surgeon: Neill Boas, [name];  Location: [facility];  Service: Podiatry;  Laterality: Left;   AMPUTATION TOE Left 04/06/2022   Procedure: AMPUTATION TOE;  Surgeon: Neill Boas, [name];  Location: [facility];  Service: Podiatry;  Laterality: Left;   COLONOSCOPY WITH PROPOFOL  N/A 12/22/2019   Procedure: COLONOSCOPY WITH PROPOFOL ;  Surgeon: Jinny Carmine, MD;  Location: [facility];  Service: Endoscopy;  Laterality: N/A;   INCISION AND DRAINAGE ABSCESS Left 08/11/2021   Procedure: INCISION AND DRAINAGE ABSCESS;  Surgeon: Herminio Miu, MD;  Location: [facility];  Service: ENT;  Laterality: Left;   LOWER EXTREMITY ANGIOGRAPHY Left 03/18/2020   Procedure: Lower Extremity Angiography;  Surgeon: Jama Cordella MATSU, MD;  Location: [facility];  Service: Cardiovascular;  Laterality: Left;   LOWER EXTREMITY ANGIOGRAPHY Right 11/05/2022   Procedure: Lower Extremity Angiography;  Surgeon: Marea Selinda RAMAN, MD;  Location: [facility];  Service: Cardiovascular;  Laterality: Right;   TONSILLECTOMY     VIDEO ASSISTED THORACOSCOPY (VATS)/DECORTICATION Left 08/15/2020   Procedure: VIDEO ASSISTED THORACOSCOPY (VATS)/DECORTICATION;  Surgeon: Shyrl Linnie KIDD, MD;  Location: [facility];  Service: Thoracic;  Laterality: Left;   WISDOM TOOTH EXTRACTION       Medications:   Current Facility-Administered Medications:    acetaminophen  (TYLENOL ) tablet 650 mg, 650 mg, Oral, Q4H PRN, Mansy, Jan A, MD, 650 mg at 04/21/24 2053   albuterol  (PROVENTIL ) (2.5 MG/3ML)  0.083% nebulizer solution 3 mL, 3 mL, Nebulization, Q4H PRN, Zhang, Dekui, MD, 3 mL at 04/22/24 0156   alum & mag hydroxide-simeth (MAALOX/MYLANTA) 200-200-20 MG/5ML suspension 30 mL, 30 mL, Oral, Q4H PRN, Zhang, Dekui, MD, 30 mL at 04/16/24 2135   ARIPiprazole  (ABILIFY ) tablet 5 mg, 5 mg, Oral, Daily, Laurita Pillion, MD, 5 mg at 04/22/24 0801   cholecalciferol (VITAMIN D3) 25 MCG (1000 UNIT) tablet 500 Units, 500 Units, Oral, Daily, Zhang, Dekui, MD, 500 Units at 04/22/24 0800   clopidogrel  (PLAVIX ) tablet 75 mg, 75  mg, Oral, Daily, Pokhrel, Laxman, MD, 75 mg at 04/22/24 0801   cyanocobalamin  (VITAMIN B12) tablet 1,000 mcg, 1,000 mcg, Oral, Daily, Pokhrel, Laxman, MD, 1,000 mcg at 04/22/24 0801   enoxaparin  (LOVENOX ) injection 40 mg, 40 mg, Subcutaneous, Q24H, Awanda [place], MD, 40 mg at 04/21/24 2144   fluticasone  furoate-vilanterol (BREO ELLIPTA ) 100-25 MCG/ACT 1 puff, 1 puff, Inhalation, Daily, Pokhrel, Laxman, MD, 1 puff at 04/22/24 0757   gabapentin  (NEURONTIN ) capsule 100 mg, 100 mg, Oral, BID, Zhang, Dekui, MD, 100 mg at 04/22/24 9198   hydrALAZINE  (APRESOLINE ) injection 5 mg, 5 mg, Intravenous, Q6H PRN, Pokhrel, Laxman, MD   hydrOXYzine (ATARAX) tablet 25 mg, 25 mg, Oral, TID PRN, Zhang, Dekui, MD, 25 mg at 04/21/24 2151   insulin  aspart (novoLOG ) injection 0-15 Units, 0-15 Units, Subcutaneous, TID WC, Cleatus Delayne GAILS, MD, 15 Units at 04/22/24 0755   insulin  aspart (novoLOG ) injection 0-5 Units, 0-5 Units, Subcutaneous, QHS, Cleatus Delayne GAILS, MD, 3 Units at 04/21/24 2145   insulin  glargine-yfgn (SEMGLEE ) injection 10 Units, 10 Units, Subcutaneous, QHS, Zhang, Dekui, MD, 10 Units at 04/21/24 2146   melatonin tablet 5 mg, 5 mg, Oral, QHS PRN, Pokhrel, Laxman, MD, 5 mg at 04/21/24 2151   metoprolol  succinate (TOPROL -XL) 24 hr tablet 50 mg, 50 mg, Oral, Daily, Pokhrel, Laxman, MD, 50 mg at 04/22/24 0802   morphine  (PF) 2 MG/ML injection 2 mg, 2 mg, Intravenous, Q4H PRN, Mansy, Jan A, MD   nicotine   (NICODERM CQ  - dosed in mg/24 hours) patch 21 mg, 21 mg, Transdermal, Daily, Pokhrel, Laxman, MD, 21 mg at 04/22/24 0804   ondansetron  (ZOFRAN ) tablet 4 mg, 4 mg, Oral, Q6H PRN **OR** ondansetron  (ZOFRAN ) injection 4 mg, 4 mg, Intravenous, Q6H PRN, Pokhrel, Laxman, MD   pantoprazole  (PROTONIX ) EC tablet 40 mg, 40 mg, Oral, Daily, Pokhrel, Laxman, MD, 40 mg at 04/22/24 0800   polyethylene glycol (MIRALAX  / GLYCOLAX ) packet 17 g, 17 g, Oral, Daily PRN, Pokhrel, Laxman, MD   sodium chloride  flush (NS) 0.9 % injection 3 mL, 3 mL, Intravenous, Q12H, Pokhrel, Laxman, MD, 3 mL at 04/22/24 0802   sodium chloride  flush (NS) 0.9 % injection 3 mL, 3 mL, Intravenous, PRN, Pokhrel, Laxman, MD   thiamine (VITAMIN B1) tablet 100 mg, 100 mg, Oral, Daily, 100 mg at 04/22/24 0802 **OR** thiamine (VITAMIN B1) injection 100 mg, 100 mg, Intravenous, Daily, Siadecki, Sebastian, MD, 100 mg at 04/14/24 9164  Allergies: Allergies  Allergen Reactions   Bee Venom Anaphylaxis   Cheese Anaphylaxis, Swelling and Other (See Comments)    Blue Cheese only   Coffea Arabica Shortness Of Breath   Coffee Flavoring Agent (Non-Screening) Shortness Of Breath    Zelda Sharps, NP This note was created using Scientist, clinical (histocompatibility and immunogenetics). Please excuse any inadvertent transcription errors. Case was discussed with supervising physician Dr. Jadapalle who is agreeable with current plan.

## 2024-04-22 NOTE — Progress Notes (Signed)
 Called transportation. ETA 30 min

## 2024-04-22 NOTE — Discharge Summary (Signed)
 [facility] Discharge Summary   Patient: Timothy [name]. FMW:[id number]  PCP: Glover Lenis, MD  Date of admission: 04/10/2024   Date of discharge:  04/22/2024     Discharge Diagnoses:  Principal Problem:   Traumatic rhabdomyolysis Active Problems:   Hypertension   Type 2 diabetes mellitus with diabetic neuropathy, unspecified (HCC)   Depression   COPD (chronic obstructive pulmonary disease) (HCC)   Dyslipidemia (high LDL; low HDL)   Fall at home, initial encounter   Hypokalemia   Hypophosphatemia   Hypomagnesemia   Alcohol abuse   Admitted From: Home Disposition:  Home   Recommendations for Outpatient Follow-up:  Follow with PCP in 1 week.  Lung cancer screening as an outpatient. Follow-up with psych as an outpatient in 1 week. Monitor CBG at home and continue to use insulin  accordingly. Follow up LABS/TEST:  Lung cancer screening   Follow-up Information     Glover Lenis, MD Follow up in 1 week(s).   Specialty: Family Medicine Contact information: [address] [place] [address] [phone]         [name] Follow up in 1 week(s).   Contact information: [address] [place] [address] [phone]                Diet recommendation: Cardiac and Carb modified diet  Activity: The patient is advised to gradually reintroduce usual activities, as tolerated  Discharge Condition: stable  Code Status: Full code   History of present illness: As per the H and P dictated on admission.  Hospital Course:  Patient is a 63 year old male with past medical history of heavy alcohol abuse, CVA, hypertension, hyperlipidemia, COPD, major depression, bipolar disorder, anxiety was brought into the hospital by EMS after he was found to be in the floor at his home. Someone came to check on him at his house and was noted to be on the floor and had altered mental status. There was no mention of how long he was on the floor.  He was last seen 2 days prior to this presentation. EMS noted that he had multiple empty alcohol bottles on his floor    Assessment and Plan:  # COPD. During hospital stay patient had some shortness of breath but no wheezing. BNP was normal, chest x-ray did not show acute changes.  S/p DuoNeb and LABA/steroid inhaler given.  Patient's condition improved.  Continued home inhaler and advised to follow-up with PCP.   # Fall and found on the floor.    # Rhabdomyolysis secondary to fall. # Frequent falls secondary to alcohol abuse. # Right BKA. Patient condition has improved.  TOC is working on discharge options.   # Hypokalemia: Resolved # Hypomagnesemia: Resolved # Hypophosphatemia: Phos repleted.  Resolved # Acute kidney injury: Resolved # Hyponatremia: Mild, follow with PCP.   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  Most likely has dementia.  Condition stable. S/p thiamine, folate and multivitamin.  No withdrawal symptoms. Discharged on thiamine 100 mg daily for 30 days.   # Essential hypertension: cont Toprol  # History of CVA: cont aspirin  81 mg, plavix  75 and Crestor  5   # History of major depressive disorder, bipolar disorder/anxiety Continued home dose Abilify  and Neurontin .   # Diabetes mellitus type 2 uncontrolled with hyperglycemia.. --A1c 8.1. s/p basal and sliding scale.  Resumed home dose on discharge, patient  was advised to monitor CBG at home, continue diabetic diet.  Follow-up with PCP.  # Pressure injury left ankle stage 3, present on admission.    # Suicide ideation Patient voiced suicidal ideation, consult psychiatry 11/5 currently patient denies any suicidal ideations.  Patient was seen by psych, cleared for discharge and follow-up as an outpatient.  Continued home medications.   Body mass index is 24.86 kg/m.  Nutrition  Interventions:  Wound 04/11/24 0800 Pressure Injury Ankle Anterior;Left Stage 3 -  Full thickness tissue loss. Subcutaneous fat may be visible but bone, tendon or muscle are NOT exposed. (Active)     On the day of the discharge the patient's vitals were stable, and no other acute medical condition were reported by patient. the patient was felt safe to be discharge at Home.  Consultants: Psych Procedures: None  Discharge Exam: General: Appear in no distress, no Rash; Oral Mucosa Clear, moist. Cardiovascular: S1 and S2 Present, no Murmur, Respiratory: normal respiratory effort, Bilateral Air entry present and no Crackles, no wheezes Abdomen: Bowel Sound present, Soft and no tenderness Extremities: no Pedal edema, no calf tenderness, s/p Right BKA Neurology: alert and oriented to time, place, and person affect appropriate.  Filed Weights   04/16/24 0500 04/19/24 0500 04/21/24 0500  Weight: 75 kg 75 kg 78.6 kg   Vitals:   04/22/24 0312 04/22/24 0717  BP: 117/67 117/66  Pulse: 68 64  Resp: 18 16  Temp: 98.7 F (37.1 C) 98.2 F (36.8 C)  SpO2: 96% 97%    DISCHARGE MEDICATION: Allergies as of 04/22/2024       Reactions   Bee Venom Anaphylaxis   Cheese Anaphylaxis, Swelling, Other (See Comments)   Blue Cheese only   Coffea Arabica Shortness Of Breath   Coffee Flavoring Agent (non-screening) Shortness Of Breath        Medication List     STOP taking these medications    Eliquis  5 MG Tabs tablet Generic drug: apixaban    insulin  aspart 100 UNIT/ML injection Commonly known as: novoLOG  Replaced by: insulin  lispro 100 UNIT/ML injection You also have another medication with the same name that you need to continue taking as instructed.   melatonin 3 MG Tabs tablet   metFORMIN  1000 MG tablet Commonly known as: GLUCOPHAGE    nicotine  21 mg/24hr patch Commonly known as: NICODERM CQ  - dosed in mg/24 hours       TAKE these medications    albuterol  (2.5 MG/3ML) 0.083%  nebulizer solution Commonly known as: PROVENTIL  Take 3 mLs (2.5 mg total) by nebulization every 2 (two) hours as needed for wheezing.   ARIPiprazole  5 MG tablet Commonly known as: ABILIFY  Take 1 tablet (5 mg total) by mouth 2 (two) times daily.   aspirin  EC 81 MG tablet Take 1 tablet (81 mg total) by mouth daily. Swallow whole.   blood glucose meter kit and supplies Kit Dispense based on patient and insurance preference. Use up to four times daily as directed. (FOR ICD-9 250.00, 250.01).   clopidogrel  75 MG tablet Commonly known as: PLAVIX  Take 1 tablet (75 mg total) by mouth daily.   cyanocobalamin  1000 MCG tablet Commonly known as: VITAMIN B12 Take 1 tablet (1,000 mcg total) by mouth daily.   gabapentin  400 MG capsule Commonly known as: NEURONTIN  Take 1 capsule (400 mg total) by mouth 3 (three) times daily.   insulin  aspart 100 UNIT/ML injection Commonly known as: novoLOG  Inject 0-15 Units into the skin 3 (three) times daily with meals. What changed:  Another medication with the same name was removed. Continue taking this medication, and follow the directions you see here.   insulin  glargine-yfgn 100 UNIT/ML injection Commonly known as: SEMGLEE  Inject 0.2 mLs (20 Units total) into the skin at bedtime.   insulin  lispro 100 UNIT/ML injection Commonly known as: HUMALOG Inject 0.03 mLs (3 Units total) into the skin 3 (three) times daily with meals. Replaces: insulin  aspart 100 UNIT/ML injection   metoprolol  succinate 50 MG 24 hr tablet Commonly known as: TOPROL -XL Take 1 tablet (50 mg total) by mouth daily.   mometasone -formoterol  100-5 MCG/ACT Aero Commonly known as: DULERA  Inhale 2 puffs into the lungs 2 (two) times daily.   pantoprazole  40 MG tablet Commonly known as: PROTONIX  Take 1 tablet (40 mg total) by mouth daily. What changed:  how much to take when to take this   Pen Needles 32G X 6 MM Misc 1 each by Does not apply route daily.   rosuvastatin  5 MG  tablet Commonly known as: Crestor  Take 1 tablet (5 mg total) by mouth daily. What changed:  how much to take when to take this   thiamine 100 MG tablet Commonly known as: Vitamin B-1 Take 1 tablet (100 mg total) by mouth daily. Start taking on: April 23, 2024   venlafaxine  XR 150 MG 24 hr capsule Commonly known as: EFFEXOR -XR Take 1 capsule (150 mg total) by mouth daily with breakfast.   Vitamin D (Ergocalciferol) 1.25 MG (50000 UNIT) Caps capsule Commonly known as: DRISDOL Take 1 capsule (50,000 Units total) by mouth every 7 (seven) days. Start taking on: April 29, 2024               Discharge Care Instructions  (From admission, onward)           Start     Ordered   04/22/24 0000  Discharge wound care:       Comments: As above   04/22/24 1630           Allergies  Allergen Reactions   Bee Venom Anaphylaxis   Cheese Anaphylaxis, Swelling and Other (See Comments)    Blue Cheese only   Coffea Arabica Shortness Of Breath   Coffee Flavoring Agent (Non-Screening) Shortness Of Breath   Discharge Instructions     Call MD for:  difficulty breathing, headache or visual disturbances   Complete by: As directed    Call MD for:  extreme fatigue   Complete by: As directed    Call MD for:  persistant dizziness or light-headedness   Complete by: As directed    Call MD for:  persistant nausea and vomiting   Complete by: As directed    Call MD for:  severe uncontrolled pain   Complete by: As directed    Call MD for:  temperature >100.4   Complete by: As directed    Discharge instructions   Complete by: As directed    Follow with PCP in 1 week.  Lung cancer screening as an outpatient. Follow-up with psych as an outpatient in 1 week. Monitor CBG at home and continue to use insulin  accordingly.   Discharge wound care:   Complete by: As directed    As above   Increase activity slowly   Complete by: As directed        The results of significant  diagnostics from this hospitalization (including imaging, microbiology, ancillary and laboratory) are listed below for reference.    Significant Diagnostic Studies: DG Chest 2 View Result Date:  04/17/2024 CLINICAL DATA:  Shortness of breath EXAM: CHEST - 2 VIEW COMPARISON:  Chest radiograph dated 04/10/2024 FINDINGS: Normal lung volumes. Increased patchy bibasilar opacities, right-greater-than-left. No pleural effusion or pneumothorax. Similar mildly enlarged cardiomediastinal silhouette. No acute osseous abnormality. Old left rib fractures. IMPRESSION: Increased patchy bibasilar opacities, right-greater-than-left, which may represent atelectasis, aspiration, or pneumonia. Electronically Signed   By: Limin  Xu M.D.   On: 04/17/2024 17:43   DG Chest Port 1 View Result Date: 04/10/2024 EXAM: 1 VIEW(S) XRAY OF THE CHEST 04/10/2024 12:06:00 PM COMPARISON: None available. CLINICAL HISTORY: Pt BIB [facility] from home and was found in the floor by a bystander with AMS. Pt was last seen 2 days previously. Unknown how long pt was in the floor. Pt with hx of ETOH use and EMS noted empty bottles lying about the scene. Pt with an odor of stale ; urine. FINDINGS: LUNGS AND PLEURA: No focal pulmonary opacity. No pulmonary edema. No pleural effusion. No pneumothorax. HEART AND MEDIASTINUM: Aortic arch calcifications. BONES AND SOFT TISSUES: Healed left 4th and 5th rib fracture deformities. IMPRESSION: 1. No acute cardiopulmonary process. Electronically signed by: Ryan Chess MD 04/10/2024 12:34 PM EDT RP Workstation: [id number]   CT Head Wo Contrast Result Date: 04/10/2024 EXAM: CT HEAD WITHOUT CONTRAST 04/10/2024 12:22:22 PM TECHNIQUE: CT of the head was performed without the administration of intravenous contrast. Automated exposure control, iterative reconstruction, and/or weight based adjustment of the mA/kV was utilized to reduce the radiation dose to as low as reasonably achievable. COMPARISON: 11/01/2023  CLINICAL HISTORY: Mental status change, unknown cause. Mental status change. FINDINGS: BRAIN AND VENTRICLES: Remote left PCA territory infarct. Mild generalized volume loss and chronic small vessel disease. No acute hemorrhage. No evidence of acute infarct. No hydrocephalus. No extra-axial collection. No mass effect or midline shift. ORBITS: No acute abnormality. SINUSES: No acute abnormality. SOFT TISSUES AND SKULL: No acute soft tissue abnormality. No skull fracture. IMPRESSION: 1. No acute intracranial abnormality. 2. Remote left PCA territory infarct. Electronically signed by: Ryan Chess MD 04/10/2024 12:33 PM EDT RP Workstation: [id number]    Microbiology: No results found for this or any previous visit (from the past 240 hours).   Labs: CBC: Recent Labs  Lab 04/18/24 0451  WBC 5.7  HGB 12.2*  HCT 35.3*  MCV 90.3  PLT 271   Basic Metabolic Panel: Recent Labs  Lab 04/16/24 0321 04/18/24 0451 04/20/24 0430  NA 137 133* 134*  K 3.7 4.0 3.9  CL 105 101 100  CO2 24 24 25   GLUCOSE 193* 249* 265*  BUN 17 16 16   CREATININE 1.04 1.24 1.24  CALCIUM  8.0* 7.8* 8.6*  MG 1.7 1.6* 1.8  PHOS 1.7* 2.7 3.0   Liver Function Tests: No results for input(s): AST, ALT, ALKPHOS, BILITOT, PROT, ALBUMIN in the last 168 hours. No results for input(s): LIPASE, AMYLASE in the last 168 hours. No results for input(s): AMMONIA in the last 168 hours. Cardiac Enzymes: No results for input(s): CKTOTAL, CKMB, CKMBINDEX, TROPONINI in the last 168 hours. BNP (last 3 results) Recent Labs    04/17/24 1004  BNP 68.5   CBG: Recent Labs  Lab 04/21/24 1208 04/21/24 1641 04/21/24 2101 04/22/24 0722 04/22/24 1132  GLUCAP 242* 286* 255* 352* 257*    Time spent: 35 minutes  Signed:  Elvan Sor  [facility] 04/22/2024 4:31 PM

## 2024-04-22 NOTE — Inpatient Diabetes Management (Signed)
 Inpatient Diabetes Program Recommendations  AACE/ADA: New Consensus Statement on Inpatient Glycemic Control (2015)  Target Ranges:  Prepandial:   less than 140 mg/dL      Peak postprandial:   less than 180 mg/dL (1-2 hours)      Critically ill patients:  140 - 180 mg/dL    Latest Reference Range & Units 04/21/24 08:20 04/21/24 12:08 04/21/24 16:41 04/21/24 21:01  Glucose-Capillary 70 - 99 mg/dL 717 (H)  8 units Novolog   242 (H)  5 units Novolog   286 (H)  8 units Novolog   255 (H)  3 units Novolog   10 units Semglee   (H): Data is abnormally high  Latest Reference Range & Units 04/22/24 07:22  Glucose-Capillary 70 - 99 mg/dL 647 (H)  15 units Novolog    (H): Data is abnormally high    Current Orders: Semglee  10 units at HS     Novolog  Moderate Correction Scale/ SSI (0-15 units) TID AC + HS    MD- Note CBG 352 this AM  Please consider:  1. Increase Semglee  to 20 units at HS (0.25 units/kg)  2. Start Novolog  Meal Coverage: Novolog  6 units TID with meals HOLD if pt NPO HOLD if pt eats <50% meals    --Will follow patient during hospitalization--  Adina Rudolpho Arrow RN, MSN, CDCES Diabetes Coordinator Inpatient Glycemic Control Team Team Pager: [phone] (8a-5p)

## 2024-04-22 NOTE — TOC Transition Note (Signed)
 Transition of Care [facility]) - Discharge Note   Patient Details  Name: Timothy [name]. MRN: [id number] Date of Birth: 1961/04/03  [facility] CM/SW Contact:  Corean ONEIDA Haddock, RN Phone Number: 04/22/2024, 4:35 PM   Clinical Narrative:     Patient to discharge today [name] has brought the patients WC to the hospital, and bedside RN took [name] to the room Patient has been cleared by psych Patient states that he wishes to return home to the address on file ([name]).  Call placed on speaker phone to emergency contact [name].   Once to the residence, if [name] will not allow entry he plans to go across the street to [name] to use her phone and request a non emergent officer come to the residence.  He intends to let tony know that he will have to file a formal eviction. During  the eviction process patient states he will pack his belongings, and attempt to find his wallet and ID so he can stay at an extended stay hotel.   Pysch has provided patient with [name] resources   Dc medication will be delivered by meds to bed   [place] waiver signed and placed in chart  Bedside RN to call safe transport after medication delivered     Barriers to Discharge: Continued Medical Work up, Other (must enter comment) (insurance authorization, alcohol use)   Patient Goals and CMS Choice            Discharge Placement                       Discharge Plan and Services Additional resources added to the After Visit Summary for     Discharge Planning Services: CM Consult                                 Social Drivers of Health (SDOH) Interventions SDOH Screenings   Food Insecurity: Patient Unable To Answer (04/11/2024)  Housing: Patient Unable To Answer (04/11/2024)  Transportation Needs: Patient Unable To Answer (04/11/2024)  Utilities: Patient Unable To Answer (04/11/2024)  Alcohol Screen: Low Risk  (10/08/2023)  Depression (PHQ2-9): High Risk  (10/03/2022)  Financial Resource Strain: Medium Risk (05/28/2022)  Physical Activity: Inactive (05/28/2022)  Social Connections: Moderately Isolated (10/07/2023)  Stress: Stress Concern Present (05/28/2022)  Tobacco Use: High Risk (04/10/2024)     Readmission Risk Interventions    11/06/2022   12:06 PM  Readmission Risk Prevention Plan  Transportation Screening Complete  PCP or Specialist Appt within 3-5 Days Complete  Social Work Consult for Recovery Care Planning/Counseling Complete  Palliative Care Screening Not Applicable  Medication Review Oceanographer) Referral to Pharmacy
